# Patient Record
Sex: Female | Born: 1945
Health system: Southern US, Community
[De-identification: ages and names within clinical notes are randomized; demographics above are authoritative.]

## PROBLEM LIST (undated history)

## (undated) DIAGNOSIS — M779 Enthesopathy, unspecified: Secondary | ICD-10-CM

## (undated) DIAGNOSIS — K559 Vascular disorder of intestine, unspecified: Secondary | ICD-10-CM

## (undated) DIAGNOSIS — F419 Anxiety disorder, unspecified: Secondary | ICD-10-CM

## (undated) DIAGNOSIS — I771 Stricture of artery: Secondary | ICD-10-CM

## (undated) DIAGNOSIS — I739 Peripheral vascular disease, unspecified: Secondary | ICD-10-CM

## (undated) DIAGNOSIS — Z7982 Long term (current) use of aspirin: Secondary | ICD-10-CM

## (undated) DIAGNOSIS — M5481 Occipital neuralgia: Secondary | ICD-10-CM

## (undated) DIAGNOSIS — D509 Iron deficiency anemia, unspecified: Secondary | ICD-10-CM

## (undated) DIAGNOSIS — D649 Anemia, unspecified: Secondary | ICD-10-CM

## (undated) DIAGNOSIS — K5909 Other constipation: Secondary | ICD-10-CM

## (undated) DIAGNOSIS — M5432 Sciatica, left side: Secondary | ICD-10-CM

## (undated) DIAGNOSIS — H02003 Unspecified entropion of right eye, unspecified eyelid: Secondary | ICD-10-CM

## (undated) DIAGNOSIS — K219 Gastro-esophageal reflux disease without esophagitis: Secondary | ICD-10-CM

## (undated) DIAGNOSIS — I10 Essential (primary) hypertension: Secondary | ICD-10-CM

## (undated) DIAGNOSIS — I2510 Atherosclerotic heart disease of native coronary artery without angina pectoris: Secondary | ICD-10-CM

## (undated) DIAGNOSIS — K2960 Other gastritis without bleeding: Secondary | ICD-10-CM

## (undated) DIAGNOSIS — D696 Thrombocytopenia, unspecified: Secondary | ICD-10-CM

## (undated) DIAGNOSIS — I5189 Other ill-defined heart diseases: Secondary | ICD-10-CM

## (undated) DIAGNOSIS — T7840XA Allergy, unspecified, initial encounter: Secondary | ICD-10-CM

## (undated) DIAGNOSIS — I6789 Other cerebrovascular disease: Secondary | ICD-10-CM

## (undated) DIAGNOSIS — K760 Fatty (change of) liver, not elsewhere classified: Secondary | ICD-10-CM

## (undated) DIAGNOSIS — K449 Diaphragmatic hernia without obstruction or gangrene: Secondary | ICD-10-CM

## (undated) DIAGNOSIS — K529 Noninfective gastroenteritis and colitis, unspecified: Secondary | ICD-10-CM

## (undated) DIAGNOSIS — M1990 Unspecified osteoarthritis, unspecified site: Secondary | ICD-10-CM

## (undated) DIAGNOSIS — S92909A Unspecified fracture of unspecified foot, initial encounter for closed fracture: Secondary | ICD-10-CM

## (undated) DIAGNOSIS — B009 Herpesviral infection, unspecified: Secondary | ICD-10-CM

## (undated) DIAGNOSIS — IMO0002 Reserved for concepts with insufficient information to code with codable children: Secondary | ICD-10-CM

## (undated) DIAGNOSIS — I6522 Occlusion and stenosis of left carotid artery: Secondary | ICD-10-CM

## (undated) DIAGNOSIS — E559 Vitamin D deficiency, unspecified: Secondary | ICD-10-CM

## (undated) DIAGNOSIS — M51369 Other intervertebral disc degeneration, lumbar region without mention of lumbar back pain or lower extremity pain: Secondary | ICD-10-CM

## (undated) DIAGNOSIS — N390 Urinary tract infection, site not specified: Secondary | ICD-10-CM

## (undated) DIAGNOSIS — K648 Other hemorrhoids: Secondary | ICD-10-CM

## (undated) DIAGNOSIS — M654 Radial styloid tenosynovitis [de Quervain]: Secondary | ICD-10-CM

## (undated) DIAGNOSIS — Z9841 Cataract extraction status, right eye: Secondary | ICD-10-CM

## (undated) DIAGNOSIS — I700 Atherosclerosis of aorta: Secondary | ICD-10-CM

## (undated) DIAGNOSIS — R7303 Prediabetes: Secondary | ICD-10-CM

## (undated) DIAGNOSIS — M1611 Unilateral primary osteoarthritis, right hip: Secondary | ICD-10-CM

## (undated) DIAGNOSIS — I471 Supraventricular tachycardia, unspecified: Secondary | ICD-10-CM

## (undated) DIAGNOSIS — N3946 Mixed incontinence: Secondary | ICD-10-CM

## (undated) DIAGNOSIS — C4491 Basal cell carcinoma of skin, unspecified: Secondary | ICD-10-CM

## (undated) DIAGNOSIS — E785 Hyperlipidemia, unspecified: Secondary | ICD-10-CM

## (undated) DIAGNOSIS — K5790 Diverticulosis of intestine, part unspecified, without perforation or abscess without bleeding: Secondary | ICD-10-CM

## (undated) DIAGNOSIS — K589 Irritable bowel syndrome without diarrhea: Secondary | ICD-10-CM

## (undated) HISTORY — DX: Vascular disorder of intestine, unspecified: K55.9

## (undated) HISTORY — DX: Gastro-esophageal reflux disease without esophagitis: K21.9

## (undated) HISTORY — DX: Diverticulosis of intestine, part unspecified, without perforation or abscess without bleeding: K57.90

## (undated) HISTORY — DX: Herpesviral infection, unspecified: B00.9

## (undated) HISTORY — DX: Reserved for concepts with insufficient information to code with codable children: IMO0002

## (undated) HISTORY — DX: Noninfective gastroenteritis and colitis, unspecified: K52.9

## (undated) HISTORY — DX: Unspecified entropion of right eye, unspecified eyelid: H02.003

## (undated) HISTORY — DX: Anemia, unspecified: D64.9

## (undated) HISTORY — DX: Essential (primary) hypertension: I10

## (undated) HISTORY — DX: Diaphragmatic hernia without obstruction or gangrene: K44.9

## (undated) HISTORY — DX: Anxiety disorder, unspecified: F41.9

## (undated) HISTORY — DX: Sciatica, left side: M54.32

## (undated) HISTORY — DX: Other hemorrhoids: K64.8

## (undated) HISTORY — DX: Allergy, unspecified, initial encounter: T78.40XA

## (undated) HISTORY — DX: Urinary tract infection, site not specified: N39.0

## (undated) HISTORY — DX: Enthesopathy, unspecified: M77.9

## (undated) HISTORY — DX: Hyperlipidemia, unspecified: E78.5

## (undated) HISTORY — DX: Unspecified fracture of unspecified foot, initial encounter for closed fracture: S92.909A

## (undated) HISTORY — DX: Basal cell carcinoma of skin, unspecified: C44.91

## (undated) HISTORY — DX: Other gastritis without bleeding: K29.60

## (undated) HISTORY — PX: LUMBAR LAMINECTOMY/DECOMPRESSION MICRODISCECTOMY: SHX5026

---

## 1968-10-19 HISTORY — PX: APPENDECTOMY: SHX54

## 1968-10-19 HISTORY — PX: CHOLECYSTECTOMY: SHX55

## 1982-10-19 HISTORY — PX: TONSILLECTOMY: SUR1361

## 1983-10-20 HISTORY — PX: ABDOMINAL HYSTERECTOMY: SHX81

## 1983-10-20 HISTORY — PX: VAGINAL HYSTERECTOMY: SHX2639

## 1997-10-19 HISTORY — PX: LAPAROSCOPIC LYSIS INTESTINAL ADHESIONS: SUR778

## 1998-06-28 ENCOUNTER — Encounter: Payer: Self-pay | Admitting: Surgery

## 1998-06-28 ENCOUNTER — Ambulatory Visit (HOSPITAL_COMMUNITY): Admission: RE | Admit: 1998-06-28 | Discharge: 1998-06-28 | Payer: Self-pay | Admitting: Surgery

## 1998-07-17 ENCOUNTER — Observation Stay (HOSPITAL_COMMUNITY): Admission: RE | Admit: 1998-07-17 | Discharge: 1998-07-18 | Payer: Self-pay | Admitting: Surgery

## 1999-03-30 ENCOUNTER — Encounter: Payer: Self-pay | Admitting: Emergency Medicine

## 1999-03-30 ENCOUNTER — Encounter: Payer: Self-pay | Admitting: Family Medicine

## 1999-03-30 ENCOUNTER — Inpatient Hospital Stay (HOSPITAL_COMMUNITY): Admission: EM | Admit: 1999-03-30 | Discharge: 1999-04-02 | Payer: Self-pay | Admitting: Emergency Medicine

## 1999-04-23 ENCOUNTER — Ambulatory Visit (HOSPITAL_COMMUNITY): Admission: RE | Admit: 1999-04-23 | Discharge: 1999-04-23 | Payer: Self-pay | Admitting: Internal Medicine

## 2003-02-28 ENCOUNTER — Other Ambulatory Visit: Admission: RE | Admit: 2003-02-28 | Discharge: 2003-02-28 | Payer: Self-pay | Admitting: Internal Medicine

## 2003-07-30 LAB — HM SIGMOIDOSCOPY

## 2004-09-16 ENCOUNTER — Ambulatory Visit: Payer: Self-pay | Admitting: Internal Medicine

## 2004-10-23 ENCOUNTER — Ambulatory Visit: Payer: Self-pay | Admitting: Internal Medicine

## 2004-12-15 ENCOUNTER — Encounter: Admission: RE | Admit: 2004-12-15 | Discharge: 2004-12-15 | Payer: Self-pay | Admitting: Internal Medicine

## 2004-12-15 ENCOUNTER — Ambulatory Visit: Payer: Self-pay | Admitting: Internal Medicine

## 2005-03-26 ENCOUNTER — Ambulatory Visit: Payer: Self-pay | Admitting: Internal Medicine

## 2005-04-20 ENCOUNTER — Ambulatory Visit: Payer: Self-pay | Admitting: Internal Medicine

## 2005-05-16 ENCOUNTER — Encounter: Admission: RE | Admit: 2005-05-16 | Discharge: 2005-05-16 | Payer: Self-pay | Admitting: Otolaryngology

## 2005-05-24 ENCOUNTER — Emergency Department (HOSPITAL_COMMUNITY): Admission: EM | Admit: 2005-05-24 | Discharge: 2005-05-25 | Payer: Self-pay | Admitting: Emergency Medicine

## 2005-06-18 ENCOUNTER — Encounter: Admission: RE | Admit: 2005-06-18 | Discharge: 2005-06-18 | Payer: Self-pay | Admitting: Otolaryngology

## 2005-07-31 ENCOUNTER — Ambulatory Visit: Payer: Self-pay | Admitting: Internal Medicine

## 2005-08-28 ENCOUNTER — Ambulatory Visit: Payer: Self-pay | Admitting: Internal Medicine

## 2005-09-28 ENCOUNTER — Ambulatory Visit: Payer: Self-pay | Admitting: Internal Medicine

## 2005-09-28 ENCOUNTER — Encounter: Admission: RE | Admit: 2005-09-28 | Discharge: 2005-09-28 | Payer: Self-pay | Admitting: Internal Medicine

## 2005-10-24 ENCOUNTER — Ambulatory Visit: Payer: Self-pay | Admitting: Family Medicine

## 2005-11-12 ENCOUNTER — Ambulatory Visit: Payer: Self-pay | Admitting: Internal Medicine

## 2006-01-04 ENCOUNTER — Other Ambulatory Visit: Admission: RE | Admit: 2006-01-04 | Discharge: 2006-01-04 | Payer: Self-pay | Admitting: Obstetrics and Gynecology

## 2006-10-22 ENCOUNTER — Ambulatory Visit: Payer: Self-pay | Admitting: Internal Medicine

## 2007-01-14 ENCOUNTER — Ambulatory Visit: Payer: Self-pay | Admitting: Internal Medicine

## 2007-01-19 ENCOUNTER — Encounter: Admission: RE | Admit: 2007-01-19 | Discharge: 2007-01-19 | Payer: Self-pay | Admitting: Internal Medicine

## 2007-05-18 ENCOUNTER — Encounter: Payer: Self-pay | Admitting: Internal Medicine

## 2007-05-19 DIAGNOSIS — K219 Gastro-esophageal reflux disease without esophagitis: Secondary | ICD-10-CM | POA: Insufficient documentation

## 2007-07-22 ENCOUNTER — Encounter: Payer: Self-pay | Admitting: Internal Medicine

## 2007-10-20 HISTORY — PX: CATARACT EXTRACTION W/ INTRAOCULAR LENS IMPLANT: SHX1309

## 2007-10-20 HISTORY — PX: CATARACT EXTRACTION: SUR2

## 2007-11-04 ENCOUNTER — Ambulatory Visit: Payer: Self-pay | Admitting: Internal Medicine

## 2007-11-04 ENCOUNTER — Telehealth: Payer: Self-pay | Admitting: Internal Medicine

## 2007-11-04 DIAGNOSIS — M5416 Radiculopathy, lumbar region: Secondary | ICD-10-CM | POA: Insufficient documentation

## 2007-11-04 DIAGNOSIS — M545 Low back pain, unspecified: Secondary | ICD-10-CM | POA: Insufficient documentation

## 2007-11-04 DIAGNOSIS — I10 Essential (primary) hypertension: Secondary | ICD-10-CM | POA: Insufficient documentation

## 2007-11-18 ENCOUNTER — Encounter: Payer: Self-pay | Admitting: Internal Medicine

## 2007-11-19 ENCOUNTER — Ambulatory Visit (HOSPITAL_COMMUNITY): Admission: RE | Admit: 2007-11-19 | Discharge: 2007-11-19 | Payer: Self-pay | Admitting: Neurosurgery

## 2007-11-23 ENCOUNTER — Encounter: Payer: Self-pay | Admitting: Internal Medicine

## 2007-11-29 ENCOUNTER — Ambulatory Visit (HOSPITAL_COMMUNITY): Admission: RE | Admit: 2007-11-29 | Discharge: 2007-11-30 | Payer: Self-pay | Admitting: Neurosurgery

## 2007-12-15 ENCOUNTER — Encounter: Payer: Self-pay | Admitting: Internal Medicine

## 2007-12-26 ENCOUNTER — Encounter: Payer: Self-pay | Admitting: Internal Medicine

## 2007-12-29 ENCOUNTER — Ambulatory Visit (HOSPITAL_COMMUNITY): Admission: RE | Admit: 2007-12-29 | Discharge: 2007-12-30 | Payer: Self-pay | Admitting: Neurosurgery

## 2008-01-20 ENCOUNTER — Encounter: Payer: Self-pay | Admitting: Internal Medicine

## 2008-01-26 ENCOUNTER — Ambulatory Visit: Payer: Self-pay | Admitting: Internal Medicine

## 2008-02-02 ENCOUNTER — Encounter: Payer: Self-pay | Admitting: Internal Medicine

## 2008-02-13 ENCOUNTER — Encounter: Payer: Self-pay | Admitting: Internal Medicine

## 2008-02-23 ENCOUNTER — Ambulatory Visit: Payer: Self-pay | Admitting: Internal Medicine

## 2008-03-09 ENCOUNTER — Encounter: Payer: Self-pay | Admitting: Internal Medicine

## 2008-03-14 ENCOUNTER — Ambulatory Visit: Payer: Self-pay | Admitting: Internal Medicine

## 2008-03-15 ENCOUNTER — Inpatient Hospital Stay (HOSPITAL_COMMUNITY): Admission: RE | Admit: 2008-03-15 | Discharge: 2008-03-19 | Payer: Self-pay | Admitting: Neurosurgery

## 2008-04-16 ENCOUNTER — Encounter: Payer: Self-pay | Admitting: Internal Medicine

## 2008-05-14 ENCOUNTER — Ambulatory Visit: Payer: Self-pay | Admitting: Internal Medicine

## 2008-05-14 DIAGNOSIS — N3000 Acute cystitis without hematuria: Secondary | ICD-10-CM | POA: Insufficient documentation

## 2008-05-23 ENCOUNTER — Encounter: Payer: Self-pay | Admitting: Internal Medicine

## 2008-06-22 ENCOUNTER — Encounter: Payer: Self-pay | Admitting: Internal Medicine

## 2008-07-09 ENCOUNTER — Ambulatory Visit: Payer: Self-pay | Admitting: Internal Medicine

## 2008-07-09 DIAGNOSIS — J309 Allergic rhinitis, unspecified: Secondary | ICD-10-CM | POA: Insufficient documentation

## 2008-07-12 ENCOUNTER — Encounter: Payer: Self-pay | Admitting: Internal Medicine

## 2008-07-25 ENCOUNTER — Telehealth: Payer: Self-pay | Admitting: Internal Medicine

## 2008-09-17 ENCOUNTER — Ambulatory Visit: Payer: Self-pay | Admitting: Internal Medicine

## 2008-11-15 ENCOUNTER — Ambulatory Visit: Payer: Self-pay | Admitting: Internal Medicine

## 2008-11-15 LAB — CONVERTED CEMR LAB
ALT: 13 units/L (ref 0–35)
AST: 17 units/L (ref 0–37)
Basophils Absolute: 0 10*3/uL (ref 0.0–0.1)
Basophils Relative: 0.5 % (ref 0.0–3.0)
Bilirubin Urine: NEGATIVE
Blood in Urine, dipstick: NEGATIVE
CO2: 31 meq/L (ref 19–32)
Chloride: 106 meq/L (ref 96–112)
Cholesterol: 227 mg/dL (ref 0–200)
Creatinine, Ser: 0.9 mg/dL (ref 0.4–1.2)
Direct LDL: 150.6 mg/dL
Eosinophils Absolute: 0.2 10*3/uL (ref 0.0–0.7)
GFR calc Af Amer: 81 mL/min
GFR calc non Af Amer: 67 mL/min
Glucose, Bld: 106 mg/dL — ABNORMAL HIGH (ref 70–99)
Glucose, Urine, Semiquant: NEGATIVE
HCT: 34.9 % — ABNORMAL LOW (ref 36.0–46.0)
HDL: 48.7 mg/dL (ref >39.0)
MCV: 82.1 fL (ref 78.0–100.0)
Neutrophils Relative %: 53.8 % (ref 43.0–77.0)
Platelets: 196 10*3/uL (ref 150–400)
RBC: 4.25 M/uL (ref 3.87–5.11)
TSH: 1.5 microintl units/mL (ref 0.35–5.50)
VLDL: 30 mg/dL (ref 0–40)
WBC: 7 10*3/uL (ref 4.5–10.5)

## 2008-12-03 ENCOUNTER — Ambulatory Visit: Payer: Self-pay | Admitting: Internal Medicine

## 2008-12-03 DIAGNOSIS — E785 Hyperlipidemia, unspecified: Secondary | ICD-10-CM | POA: Insufficient documentation

## 2008-12-03 DIAGNOSIS — D509 Iron deficiency anemia, unspecified: Secondary | ICD-10-CM | POA: Insufficient documentation

## 2008-12-07 ENCOUNTER — Telehealth: Payer: Self-pay | Admitting: Internal Medicine

## 2009-02-06 ENCOUNTER — Ambulatory Visit: Payer: Self-pay | Admitting: Internal Medicine

## 2009-02-06 LAB — CONVERTED CEMR LAB
Basophils Relative: 0.6 % (ref 0.0–3.0)
Cholesterol: 233 mg/dL — ABNORMAL HIGH (ref 0–200)
Direct LDL: 146.9 mg/dL
Eosinophils Relative: 5.2 % — ABNORMAL HIGH (ref 0.0–5.0)
HCT: 34.1 % — ABNORMAL LOW (ref 36.0–46.0)
HDL: 47.6 mg/dL (ref >39.00)
Hemoglobin: 11.8 g/dL — ABNORMAL LOW (ref 12.0–15.0)
MCHC: 34.6 g/dL (ref 30.0–36.0)
MCV: 83.3 fL (ref 78.0–100.0)
Monocytes Relative: 8.5 % (ref 3.0–12.0)
Triglycerides: 164 mg/dL — ABNORMAL HIGH (ref 0.0–149.0)

## 2009-02-13 ENCOUNTER — Ambulatory Visit: Payer: Self-pay | Admitting: Internal Medicine

## 2009-02-13 LAB — CONVERTED CEMR LAB
Cholesterol, target level: 200 mg/dL
LDL Goal: 130 mg/dL

## 2009-03-29 ENCOUNTER — Encounter: Payer: Self-pay | Admitting: Internal Medicine

## 2009-03-30 ENCOUNTER — Encounter: Payer: Self-pay | Admitting: Internal Medicine

## 2009-04-08 ENCOUNTER — Ambulatory Visit: Payer: Self-pay | Admitting: Internal Medicine

## 2009-04-08 LAB — CONVERTED CEMR LAB
Bilirubin, Direct: 0 mg/dL (ref 0.0–0.3)
HDL: 52.3 mg/dL (ref >39.00)
LDL Cholesterol: 80 mg/dL (ref 0–99)
Total Bilirubin: 0.9 mg/dL (ref 0.3–1.2)
Total Protein: 7 g/dL (ref 6.0–8.3)
Triglycerides: 116 mg/dL (ref 0.0–149.0)

## 2009-04-12 ENCOUNTER — Ambulatory Visit: Payer: Self-pay | Admitting: Internal Medicine

## 2009-12-06 ENCOUNTER — Telehealth: Payer: Self-pay | Admitting: Internal Medicine

## 2009-12-22 ENCOUNTER — Emergency Department (HOSPITAL_COMMUNITY): Admission: EM | Admit: 2009-12-22 | Discharge: 2009-12-22 | Payer: Self-pay | Admitting: Emergency Medicine

## 2009-12-26 ENCOUNTER — Telehealth: Payer: Self-pay | Admitting: Internal Medicine

## 2009-12-27 ENCOUNTER — Ambulatory Visit: Payer: Self-pay | Admitting: Internal Medicine

## 2009-12-27 DIAGNOSIS — M940 Chondrocostal junction syndrome [Tietze]: Secondary | ICD-10-CM | POA: Insufficient documentation

## 2009-12-27 DIAGNOSIS — F4321 Adjustment disorder with depressed mood: Secondary | ICD-10-CM | POA: Insufficient documentation

## 2009-12-31 ENCOUNTER — Inpatient Hospital Stay (HOSPITAL_COMMUNITY): Admission: RE | Admit: 2009-12-31 | Discharge: 2010-01-01 | Payer: Self-pay | Admitting: Obstetrics and Gynecology

## 2009-12-31 HISTORY — PX: ANTERIOR AND POSTERIOR VAGINAL REPAIR: SUR5

## 2010-01-28 ENCOUNTER — Ambulatory Visit: Payer: Self-pay | Admitting: Internal Medicine

## 2010-01-28 LAB — CONVERTED CEMR LAB
ALT: 13 units/L (ref 0–35)
AST: 16 units/L (ref 0–37)
Albumin: 3.6 g/dL (ref 3.5–5.2)
Alkaline Phosphatase: 90 units/L (ref 39–117)
BUN: 22 mg/dL (ref 6–23)
Basophils Absolute: 0 10*3/uL (ref 0.0–0.1)
Basophils Relative: 0.3 % (ref 0.0–3.0)
Bilirubin, Direct: 0 mg/dL (ref 0.0–0.3)
CO2: 30 meq/L (ref 19–32)
Calcium: 9 mg/dL (ref 8.4–10.5)
Creatinine, Ser: 0.8 mg/dL (ref 0.4–1.2)
Eosinophils Absolute: 0.3 10*3/uL (ref 0.0–0.7)
Glucose, Bld: 88 mg/dL (ref 70–99)
Glucose, Urine, Semiquant: NEGATIVE
Ketones, urine, test strip: NEGATIVE
MCV: 83.9 fL (ref 78.0–100.0)
Monocytes Absolute: 0.5 10*3/uL (ref 0.1–1.0)
Monocytes Relative: 7.5 % (ref 3.0–12.0)
Nitrite: NEGATIVE
RBC: 4.09 M/uL (ref 3.87–5.11)
Total Bilirubin: 0.3 mg/dL (ref 0.3–1.2)
Total CHOL/HDL Ratio: 3
Triglycerides: 192 mg/dL — ABNORMAL HIGH (ref 0.0–149.0)
VLDL: 38.4 mg/dL (ref 0.0–40.0)
WBC: 7 10*3/uL (ref 4.5–10.5)

## 2010-02-03 ENCOUNTER — Ambulatory Visit: Payer: Self-pay | Admitting: Internal Medicine

## 2010-05-19 ENCOUNTER — Ambulatory Visit: Payer: Self-pay | Admitting: Family Medicine

## 2010-05-19 DIAGNOSIS — J209 Acute bronchitis, unspecified: Secondary | ICD-10-CM | POA: Insufficient documentation

## 2010-05-30 ENCOUNTER — Telehealth: Payer: Self-pay | Admitting: Internal Medicine

## 2010-05-30 ENCOUNTER — Ambulatory Visit: Payer: Self-pay | Admitting: Internal Medicine

## 2010-06-11 ENCOUNTER — Telehealth: Payer: Self-pay | Admitting: Internal Medicine

## 2010-08-04 ENCOUNTER — Ambulatory Visit: Payer: Self-pay | Admitting: Internal Medicine

## 2010-11-12 ENCOUNTER — Other Ambulatory Visit: Payer: Self-pay | Admitting: Dermatology

## 2010-11-16 LAB — CONVERTED CEMR LAB
Folate: >20 ng/mL
Iron: 24 ug/dL — ABNORMAL LOW (ref 42–145)

## 2010-11-20 NOTE — Progress Notes (Signed)
Summary: new rx needed  Phone Note Refill Request Call back at Home Phone [phone] Message from:  Patient----live call  Refills Requested: Medication #1:  DIOVAN HCT 80-12.5 MG TABS 1 once daily   Dosage confirmed as above?Dosage Confirmed [facility] to [facility] in [place]...[phone]  Initial call taken by: Warnell Forester,  June 11, 2010 9:14 AM    Prescriptions: DIOVAN HCT 80-12.5 MG TABS (VALSARTAN-HYDROCHLOROTHIAZIDE) 1 once daily  #90 x 3   Entered by:   Willy Eddy, LPN   Authorized by:   Stacie Glaze MD   Signed by:   Willy Eddy, LPN on 13/24/4010   Method used:   Electronically to        [facility] [address] (retail)       [address], [address]       [place], [place]  [address]       Ph: [phone]       Fax: [phone]   RxID:   [id number]

## 2010-11-20 NOTE — Progress Notes (Signed)
Summary: ? chest/breast pain?  Phone Note Call from Patient   Caller: Patient Call For: Stacie Glaze MD Reason for Call: Lab or Test Results Summary of Call: Pt went to the ER on Sunday for left breast pain and had cardiac eval that was normal. The pain is worse at rest, and she cannot reproduce it. Taking Tylenol and Xanax with relief.  When she hurts, she gets anxious and that makes it worse.  Wants to see Dr. Lovell Sheehan. [phone] Initial call taken by: Lynann Beaver CMA,  December 26, 2009 9:12 AM  Follow-up for Phone Call        ov given for tom orrow Follow-up by: Willy Eddy, LPN,  December 26, 2009 10:15 AM

## 2010-11-20 NOTE — Assessment & Plan Note (Signed)
Summary: cpx/mm/pt rsc from bmp/cjr PT RSC/NJR/pt rsc from bmp/cjr   Vital Signs:  Patient profile:   65 year old female Height:      65 inches Weight:      150 pounds BMI:     25.05 Temp:     98.2 degrees F oral Pulse rate:   72 / minute Resp:     14 per minute BP sitting:   130 / 70  (left arm)  Vitals Entered By: Willy Eddy, LPN (February 03, 2010 3:37 PM) CC: cpx-no bone denity   CC:  cpx-no bone denity.  History of Present Illness: The pt was asked about all immunizations, health maint. services that are appropriate to their age and was given guidance on diet exercize  and weight management   Preventive Screening-Counseling & Management  Alcohol-Tobacco     Smoking Status: never  Problems Prior to Update: 1)  Adjustment Disorder With Depressed Mood  (ICD-309.0) 2)  Costochondritis  (ICD-733.6) 3)  Preventive Health Care  (ICD-V70.0) 4)  Hyperlipidemia  (ICD-272.4) 5)  Other Specified Iron Deficiency Anemias  (ICD-280.8) 6)  Allergic Rhinitis  (ICD-477.9) 7)  Acute Cystitis  (ICD-595.0) 8)  Back Pain, Lumbar, With Radiculopathy  (ICD-724.4) 9)  Hypertension  (ICD-401.9) 10)  Gerd  (ICD-530.81)  Medications Prior to Update: 1)  Nexium 40 Mg  Cpdr (Esomeprazole Magnesium) .... Take 1 Tablet By Mouth Once A Day 2)  Oscal 500/200 D-3 500-200 Mg-Unit  Tabs (Calcium-Vitamin D) .... Take 1 Tablet By Mouth Once A Day 3)  Xanax 0.25 Mg  Tabs (Alprazolam) .... As Needed 4)  Allegra 180 Mg  Tabs (Fexofenadine Hcl) .Marland Kitchen.. 1 Once Daily 5)  Diovan Hct 80-12.5 Mg Tabs (Valsartan-Hydrochlorothiazide) .Marland Kitchen.. 1 Once Daily 6)  Nu-Iron 150 Mg Caps (Polysaccharide Iron Complex) .... One By Mouth Daily 7)  Fish Oil Concentrate 1000 Mg Caps (Omega-3 Fatty Acids) .... Two By Mouth Bid 8)  Lipitor 20 Mg Tab (Atorvastatin Calcium) .... Take 1  On Mondays and Fridays 9)  Lexapro 10 Mg Tabs (Escitalopram Oxalate) .... One By Mouth Daily  Current Medications (verified): 1)  Nexium 40 Mg   Cpdr (Esomeprazole Magnesium) .... Take 1 Tablet By Mouth Once A Day 2)  Oscal 500/200 D-3 500-200 Mg-Unit  Tabs (Calcium-Vitamin D) .... Take 1 Tablet By Mouth Once A Day 3)  Xanax 0.25 Mg  Tabs (Alprazolam) .... As Needed 4)  Allegra 180 Mg  Tabs (Fexofenadine Hcl) .Marland Kitchen.. 1 Once Daily 5)  Diovan Hct 80-12.5 Mg Tabs (Valsartan-Hydrochlorothiazide) .Marland Kitchen.. 1 Once Daily 6)  Lipitor 20 Mg Tab (Atorvastatin Calcium) .... Take 1  On Mondays and Fridays  Allergies (verified): 1)  ! Penicillin 2)  ! Codeine 3)  ! Sulfa 4)  ! Biaxin 5)  ! * Mycins 6)  ! Macrobid (Nitrofurantoin Monohyd Macro) 7)  ! Tegretol  Past History:  Family History: Last updated: 11/04/2007 mother... alive    Family History of Arthritis father died at 46 of a CVA Family History Hypertension  Social History: Last updated: 11/04/2007 Never Smoked Married Alcohol use-no Drug use-no  Risk Factors: Smoking Status: never (02/03/2010)  Past medical, surgical, family and social histories (including risk factors) reviewed, and no changes noted (except as noted below).  Past Medical History: Reviewed history from 12/03/2008 and no changes required. GERD IBS Hypertension Allergic rhinitis Hyperlipidemia  Past Surgical History: Reviewed history from 12/03/2008 and no changes required. back injections Laparotomy-exploratory adhesions Cholecystectomy Appendectomy Tonsillectomy Hysterectomy Lumbar laminectomy and disectomy  Family History: Reviewed history from 11/04/2007 and no changes required. mother... alive    Family History of Arthritis father died at 27 of a CVA Family History Hypertension  Social History: Reviewed history from 11/04/2007 and no changes required. Never Smoked Married Alcohol use-no Drug use-no  Review of Systems  The patient denies anorexia, fever, weight loss, weight gain, vision loss, decreased hearing, hoarseness, chest pain, syncope, dyspnea on exertion, peripheral edema,  prolonged cough, headaches, hemoptysis, abdominal pain, melena, hematochezia, severe indigestion/heartburn, hematuria, incontinence, genital sores, muscle weakness, suspicious skin lesions, transient blindness, difficulty walking, depression, unusual weight change, abnormal bleeding, enlarged lymph nodes, angioedema, and breast masses.    Physical Exam  General:  Well-developed,well-nourished,in no acute distress; alert,appropriate and cooperative throughout examination Head:  Normocephalic and atraumatic without obvious abnormalities. No apparent alopecia or balding. Ears:  R ear normal and L ear normal.   Nose:  External nasal examination shows no deformity or inflammation. Nasal mucosa are pink and moist without lesions or exudates. Mouth:  Oral mucosa and oropharynx without lesions or exudates.  Teeth in good repair. Neck:  No deformities, masses, or tenderness noted. Lungs:  normal respiratory effort and no wheezes.   Heart:  normal rate and regular rhythm.   Abdomen:  soft and non-tender.   Pulses:  R and L carotid,radial,femoral,dorsalis pedis and posterior tibial pulses are full and equal bilaterally Extremities:  No clubbing, cyanosis, edema, or deformity noted with normal full range of motion of all joints.   Neurologic:  No cranial nerve deficits noted. Station and gait are normal. Plantar reflexes are down-going bilaterally. DTRs are symmetrical throughout. Sensory, motor and coordinative functions appear intact. Skin:  color normal and no rashes.   Cervical Nodes:  No lymphadenopathy noted Axillary Nodes:  No palpable lymphadenopathy Psych:  Oriented X3 and memory intact for recent and remote.     Impression & Recommendations:  Problem # 1:  PREVENTIVE HEALTH CARE (ICD-V70.0) The pt was asked about all immunizations, health maint. services that are appropriate to their age and was given guidance on diet exercize  and weight management  Mammogram: normal (04/17/2009) Pap  smear: normal (09/17/2009) Td Booster: Historical (10/19/2002)   Flu Vax: Fluvax 3+ (07/09/2008)   Pneumovax: Pneumovax (12/03/2008) Chol: 181 (01/28/2010)   HDL: 54.70 (01/28/2010)   LDL: 88 (01/28/2010)   TG: 192.0 (01/28/2010) TSH: 1.29 (01/28/2010)   Next mammogram due:: 04/2010 (02/03/2010)  Discussed using sunscreen, use of alcohol, drug use, self breast exam, routine dental care, routine eye care, schedule for GYN exam, routine physical exam, seat belts, multiple vitamins, osteoporosis prevention, adequate calcium intake in diet, recommendations for immunizations, mammograms and Pap smears.  Discussed exercise and checking cholesterol.  Discussed gun safety, safe sex, and contraception.  Problem # 2:  OTHER SPECIFIED IRON DEFICIENCY ANEMIAS (ICD-280.8) add Mutivitamin The following medications were removed from the medication list:    Nu-iron 150 Mg Caps (Polysaccharide iron complex) ..... One by mouth daily  Hgb: 11.7 (01/28/2010)   Hct: 34.3 (01/28/2010)   Platelets: 183.0 (01/28/2010) RBC: 4.09 (01/28/2010)   RDW: 14.2 (01/28/2010)   WBC: 7.0 (01/28/2010) MCV: 83.9 (01/28/2010)   MCHC: 34.2 (01/28/2010) Iron: 24 (12/03/2008)   % Sat: 6.3 (12/03/2008) B12: 484 (12/03/2008)   Folate: > 20.0 ng/mL (12/03/2008)   TSH: 1.29 (01/28/2010)  Complete Medication List: 1)  Nexium 40 Mg Cpdr (Esomeprazole magnesium) .... Take 1 tablet by mouth once a day 2)  Oscal 500/200 D-3 500-200 Mg-unit Tabs (Calcium-vitamin d) .... Take 1  tablet by mouth once a day 3)  Xanax 0.25 Mg Tabs (Alprazolam) .... As needed 4)  Allegra 180 Mg Tabs (Fexofenadine hcl) .Marland Kitchen.. 1 once daily 5)  Diovan Hct 80-12.5 Mg Tabs (Valsartan-hydrochlorothiazide) .Marland Kitchen.. 1 once daily 6)  Lipitor 20 Mg Tab (Atorvastatin calcium) .... Take 1  on mondays and fridays  Patient Instructions: 1)  add a multivitamin to replace the e and the c  make sure it is a woman's vitamin with iron and folate 2)  Please schedule a follow-up  appointment in 6 months.   Preventive Care Screening  Mammogram:    Date:  04/17/2009    Next Due:  04/2010    Results:  normal   Pap Smear:    Date:  09/17/2009    Next Due:  09/2010    Results:  normal      Contraindications/Deferment of Procedures/Staging:    Test/Procedure: FLU VAX    Reason for deferment: patient declined

## 2010-11-20 NOTE — Progress Notes (Signed)
Summary: Pt still has non-productive cough.Req ov with Dr. Lovell Sheehan  Phone Note Call from Patient Call back at [facility] Phone [phone]   Caller: Patient Complaint: Urinary/GYN Problems Summary of Call: Pt called and said that she came in to see Dr. Caryl Never re: a cough she has and was told to sch an appt with Dr. Lovell Sheehan if cough does not improve. Pt says that she may need to get a chest xray.  Pt has non productive cough.  Initial call taken by: Lucy Antigua,  May 30, 2010 11:33 AM     Appended Document: Orders Update    Clinical Lists Changes  Medications: Added new medication of ZITHROMAX Z-PAK 250 MG TABS (AZITHROMYCIN) take as directed - Signed Rx of ZITHROMAX Z-PAK 250 MG TABS (AZITHROMYCIN) take as directed;  #1 x 0;  Signed;  Entered by: Willy Eddy, LPN;  Authorized by: Stacie Glaze MD;  Method used: Electronically to [facility] [address], [place], [place], [place]  [address], Ph: [phone], Fax: [phone]    Prescriptions: ZITHROMAX Z-PAK 250 MG TABS (AZITHROMYCIN) take as directed  #1 x 0   Entered by:   Willy Eddy, LPN   Authorized by:   Stacie Glaze MD   Signed by:   Willy Eddy, LPN on 44/12/4740   Method used:   Electronically to        [facility] (retail)       [address], [address]       [place], [place]  [address]       Ph: [phone]       Fax: [phone]   RxID:   [id number]

## 2010-11-20 NOTE — Assessment & Plan Note (Signed)
Summary: 6 month fup//ccm   Vital Signs:  Patient profile:   65 year old female Height:      65 inches Weight:      152 pounds BMI:     25.39 Temp:     98.2 degrees F oral Pulse rate:   72 / minute Resp:     14 per minute BP sitting:   130 / 82  (left arm)  Vitals Entered By: Willy Eddy, LPN (August 04, 2010 10:42 AM) CC: roa, Hypertension Management Is Patient Diabetic? No   Primary Care Provider:  Stacie Glaze MD  CC:  roa and Hypertension Management.  History of Present Illness: The events of the chronic bronchitis in August reviwed with the pt. The blood pressre and the GERD is stable has been on nexium but this has been cost equivalent the Diovan is "too expensive" as weel and pushes here into the donut hold too fast plantar faciatis pain in the AM    Hypertension History:      She denies headache, chest pain, palpitations, dyspnea with exertion, orthopnea, PND, peripheral edema, visual symptoms, neurologic problems, syncope, and side effects from treatment.        Positive major cardiovascular risk factors include female age 18 years old or older, hyperlipidemia, and hypertension.  Negative major cardiovascular risk factors include non-tobacco-user status.     Preventive Screening-Counseling & Management  Alcohol-Tobacco     Smoking Status: never  Current Problems (verified): 1)  Acute Bronchitis  (ICD-466.0) 2)  Adjustment Disorder With Depressed Mood  (ICD-309.0) 3)  Costochondritis  (ICD-733.6) 4)  Preventive Health Care  (ICD-V70.0) 5)  Hyperlipidemia  (ICD-272.4) 6)  Other Specified Iron Deficiency Anemias  (ICD-280.8) 7)  Allergic Rhinitis  (ICD-477.9) 8)  Acute Cystitis  (ICD-595.0) 9)  Back Pain, Lumbar, With Radiculopathy  (ICD-724.4) 10)  Hypertension  (ICD-401.9) 11)  Gerd  (ICD-530.81)  Current Medications (verified): 1)  Prilosec 20 Mg Cpdr (Omeprazole) .... One By Mouth Daily 2)  Multivitamins  Caps (Multiple Vitamin) .Marland Kitchen.. 1 Once  Daily 3)  Xanax 0.25 Mg  Tabs (Alprazolam) .... As Needed Three Times A Day 4)  Allegra 180 Mg  Tabs (Fexofenadine Hcl) .Marland Kitchen.. 1 Once Daily 5)  Benazepril-Hydrochlorothiazide 20-12.5 Mg Tabs (Benazepril-Hydrochlorothiazide) .... One By Mouth Daily 6)  Lipitor 20 Mg Tab (Atorvastatin Calcium) .... Take 1 On  Monday and Friday  Allergies (verified): 1)  ! Penicillin 2)  ! Codeine 3)  ! Sulfa 4)  ! Biaxin 5)  ! * Mycins 6)  ! Macrobid (Nitrofurantoin Monohyd Macro) 7)  ! Tegretol  Past History:  Family History: Last updated: 11/04/2007 mother... alive    Family History of Arthritis father died at 62 of a CVA Family History Hypertension  Social History: Last updated: 11/04/2007 Never Smoked Married Alcohol use-no Drug use-no  Risk Factors: Smoking Status: never (08/04/2010)  Past medical, surgical, family and social histories (including risk factors) reviewed, and no changes noted (except as noted below).  Past Medical History: Reviewed history from 12/03/2008 and no changes required. GERD IBS Hypertension Allergic rhinitis Hyperlipidemia  Past Surgical History: Reviewed history from 12/03/2008 and no changes required. back injections Laparotomy-exploratory adhesions Cholecystectomy Appendectomy Tonsillectomy Hysterectomy Lumbar laminectomy and disectomy  Family History: Reviewed history from 11/04/2007 and no changes required. mother... alive    Family History of Arthritis father died at 15 of a CVA Family History Hypertension  Social History: Reviewed history from 11/04/2007 and no changes required. Never Smoked Married Alcohol  use-no Drug use-no  Review of Systems       Flu Vaccine Consent Questions     Do you have a history of severe allergic reactions to this vaccine? no    Any prior history of allergic reactions to egg and/or gelatin? no    Do you have a sensitivity to the preservative Thimersol? no    Do you have a past history of  Guillan-Barre Syndrome? no    Do you currently have an acute febrile illness? no    Have you ever had a severe reaction to latex? no    Vaccine information given and explained to patient? yes    Are you currently pregnant? no    Lot Number:[id number]   Exp Date:04/18/2011   Site Given  Left Deltoid IM   Physical Exam  General:  Well-developed,well-nourished,in no acute distress; alert,appropriate and cooperative throughout examination Head:  Normocephalic and atraumatic without obvious abnormalities. No apparent alopecia or balding. Ears:  R ear normal and L ear normal.   Nose:  External nasal examination shows no deformity or inflammation. Nasal mucosa are pink and moist without lesions or exudates. Neck:  No deformities, masses, or tenderness noted. Lungs:  Normal respiratory effort, chest expands symmetrically. Lungs are clear to auscultation, no crackles or wheezes. Heart:  Normal rate and regular rhythm. S1 and S2 normal without gallop, murmur, click, rub or other extra sounds. Abdomen:  soft and non-tender.     Impression & Recommendations:  Problem # 1:  HYPERTENSION (ICD-401.9) could not affor the benicar or the diovan requesting generic Her updated medication list for this problem includes:    Benazepril-hydrochlorothiazide 20-12.5 Mg Tabs (Benazepril-hydrochlorothiazide) ..... One by mouth daily  BP today: 130/82 Prior BP: 140/70 (05/19/2010)  Prior 10 Yr Risk Heart Disease: 7 % (04/12/2009)  Labs Reviewed: K+: 3.6 (01/28/2010) Creat: : 0.8 (01/28/2010)   Chol: 181 (01/28/2010)   HDL: 54.70 (01/28/2010)   LDL: 88 (01/28/2010)   TG: 192.0 (01/28/2010)  Problem # 2:  GERD (ICD-530.81)  Her updated medication list for this problem includes:    Prilosec 20 Mg Cpdr (Omeprazole) ..... One by mouth daily  Labs Reviewed: Hgb: 11.7 (01/28/2010)   Hct: 34.3 (01/28/2010)  Complete Medication List: 1)  Prilosec 20 Mg Cpdr (Omeprazole) .... One by mouth daily 2)   Multivitamins Caps (Multiple vitamin) .Marland Kitchen.. 1 once daily 3)  Xanax 0.25 Mg Tabs (Alprazolam) .... As needed three times a day 4)  Allegra 180 Mg Tabs (Fexofenadine hcl) .Marland Kitchen.. 1 once daily 5)  Benazepril-hydrochlorothiazide 20-12.5 Mg Tabs (Benazepril-hydrochlorothiazide) .... One by mouth daily 6)  Lipitor 20 Mg Tab (Atorvastatin calcium) .... Take 1 on  monday and friday  Other Orders: Flu Vaccine 66yrs + MEDICARE PATIENTS (Z6109) Administration Flu vaccine - MCR (U0454)  Hypertension Assessment/Plan:      The patient's hypertensive risk group is category B: At least one risk factor (excluding diabetes) with no target organ damage.  Her calculated 10 year risk of coronary heart disease is 7 %.  Today's blood pressure is 130/82.  Her blood pressure goal is < 140/90.  Patient Instructions: 1)  Welcome to medicare exam in [facility]  fasting 30 min Prescriptions: XANAX 0.25 MG  TABS (ALPRAZOLAM) as needed three times a day  #90 x 2   Entered and Authorized by:   Stacie Glaze MD   Signed by:   Stacie Glaze MD on 08/04/2010   Method used:   Print then Give to Patient  RxID:   [id number] BENAZEPRIL-HYDROCHLOROTHIAZIDE 20-12.5 MG TABS (BENAZEPRIL-HYDROCHLOROTHIAZIDE) one by mouth daily  #30 x 11   Entered and Authorized by:   Stacie Glaze MD   Signed by:   Stacie Glaze MD on 08/04/2010   Method used:   Electronically to        [facility] [address] (retail)       [address], [address]       [place], [place]  [address]       Ph: [phone]       Fax: [phone]   RxID:   [id number] BENAZEPRIL-HYDROCHLOROTHIAZIDE 20-12.5 MG TABS (BENAZEPRIL-HYDROCHLOROTHIAZIDE) one by mouth daily  #30 x 11   Entered and Authorized by:   Stacie Glaze MD   Signed by:   Stacie Glaze MD on 08/04/2010   Method used:   Print then Give to Patient   RxID:   [id number] BENICAR HCT 40-12.5 MG TABS (OLMESARTAN MEDOXOMIL-HCTZ) 1 by mouth daily  #30 x 6    Entered and Authorized by:   Stacie Glaze MD   Signed by:   Stacie Glaze MD on 08/04/2010   Method used:   Print then Give to Patient   RxID:   [id number]    Orders Added: 1)  Flu Vaccine 12yrs + MEDICARE PATIENTS [Q2039] 2)  Administration Flu vaccine - MCR [G0008] 3)  Est. Patient Level IV [63016]  Appended Document: Orders Update    Clinical Lists Changes  Observations: Added new observation of ZOSTAVAX VIS: 07/31/05 given August 04, 2010. (08/04/2010 11:38) Added new observation of ZOSTAVAX LOT: [id number] (08/04/2010 11:38) Added new observation of ZOSTAVAX EXP: 05/31/2011 (08/04/2010 11:38) Added new observation of ZOSTAVAX BY: Willy Eddy, LPN (35/57/3220 11:38) Added new observation of ZOSTAVAX RTE:  (08/04/2010 11:38) Added new observation of ZOSTAVAXDOSE: 0.5 ml (08/04/2010 11:38) Added new observation of ZOSTAVAX MFR: Merck (08/04/2010 11:38) Added new observation of ZOSTAVAXSITE: right deltoid (08/04/2010 11:38) Added new observation of ZOSTAVAX: Zostavax (08/04/2010 11:38)       Immunizations Administered:  Zostavax # 1:    Vaccine Type: Zostavax    Site: right deltoid    Mfr: Merck    Dose: 0.5 ml    Route:     Given by: Willy Eddy, LPN    Exp. Date: 05/31/2011    Lot #: [id number]    VIS given: 07/31/05 given August 04, 2010.

## 2010-11-20 NOTE — Assessment & Plan Note (Signed)
Summary: breast pain/bmw   Vital Signs:  Patient profile:   65 year old female Height:      65 inches Weight:      151 pounds BMI:     25.22 Temp:     98.2 degrees F oral Pulse rate:   76 / minute Resp:     14 per minute BP sitting:   130 / 80  (left arm)  Vitals Entered By: Willy Eddy, LPN (December 27, 2009 1:52 PM) CC: c/o chest pain -t0 er- not cardiac after cardiac work up- has had a few more episodes since then with soreness in area after the pain-   CC:  c/o chest pain -t0 er- not cardiac after cardiac work up- has had a few more episodes since then with soreness in area after the pain-.  History of Present Illness: pt seen in ER with chest pain and EKG and pos enzymes were negative for coronary dz. Pain however persists Located in the upper left chest and mid sternum the pain is not worsened by touch, inspiration or cough no rash has appeared no sensitivity to clothes  pain 8-9 one scale of 1to 10 no sob, no diaphoresis increased anxety  Preventive Screening-Counseling & Management  Alcohol-Tobacco     Smoking Status: never  Problems Prior to Update: 1)  Preventive Health Care  (ICD-V70.0) 2)  Hyperlipidemia  (ICD-272.4) 3)  Other Specified Iron Deficiency Anemias  (ICD-280.8) 4)  Allergic Rhinitis  (ICD-477.9) 5)  Acute Cystitis  (ICD-595.0) 6)  Back Pain, Lumbar, With Radiculopathy  (ICD-724.4) 7)  Hypertension  (ICD-401.9) 8)  Gerd  (ICD-530.81)  Medications Prior to Update: 1)  Nexium 40 Mg  Cpdr (Esomeprazole Magnesium) .... Take 1 Tablet By Mouth Once A Day 2)  Oscal 500/200 D-3 500-200 Mg-Unit  Tabs (Calcium-Vitamin D) .... Take 1 Tablet By Mouth Once A Day 3)  Xanax 0.25 Mg  Tabs (Alprazolam) .... As Needed 4)  Allegra 180 Mg  Tabs (Fexofenadine Hcl) .Marland Kitchen.. 1 Once Daily 5)  Diovan Hct 80-12.5 Mg Tabs (Valsartan-Hydrochlorothiazide) .Marland Kitchen.. 1 Once Daily 6)  Nu-Iron 150 Mg Caps (Polysaccharide Iron Complex) .... One By Mouth Daily 7)  Fish Oil  Concentrate 1000 Mg Caps (Omega-3 Fatty Acids) .... Two By Mouth Bid 8)  Cipro 250 Mg Tabs (Ciprofloxacin Hcl) .... One Two Times A Day X 10 Days 9)  Lipitor 20 Mg Tab (Atorvastatin Calcium) .... Take 1  On Mondays and Fridays 10)  Lipitor 20 Mg Tab (Atorvastatin Calcium) .... Take 1 Tablet  By Mouth Monday and Friday  Current Medications (verified): 1)  Nexium 40 Mg  Cpdr (Esomeprazole Magnesium) .... Take 1 Tablet By Mouth Once A Day 2)  Oscal 500/200 D-3 500-200 Mg-Unit  Tabs (Calcium-Vitamin D) .... Take 1 Tablet By Mouth Once A Day 3)  Xanax 0.25 Mg  Tabs (Alprazolam) .... As Needed 4)  Allegra 180 Mg  Tabs (Fexofenadine Hcl) .Marland Kitchen.. 1 Once Daily 5)  Diovan Hct 80-12.5 Mg Tabs (Valsartan-Hydrochlorothiazide) .Marland Kitchen.. 1 Once Daily 6)  Nu-Iron 150 Mg Caps (Polysaccharide Iron Complex) .... One By Mouth Daily 7)  Fish Oil Concentrate 1000 Mg Caps (Omega-3 Fatty Acids) .... Two By Mouth Bid 8)  Lipitor 20 Mg Tab (Atorvastatin Calcium) .... Take 1  On Mondays and Fridays 9)  Lexapro 10 Mg Tabs (Escitalopram Oxalate) .... One By Mouth Daily  Allergies (verified): 1)  ! Penicillin 2)  ! Codeine 3)  ! Sulfa 4)  ! Biaxin 5)  ! *  Mycins 6)  ! Macrobid (Nitrofurantoin Monohyd Macro) 7)  ! Tegretol  Past History:  Family History: Last updated: 11/04/2007 mother... alive    Family History of Arthritis father died at 10 of a CVA Family History Hypertension  Social History: Last updated: 11/04/2007 Never Smoked Married Alcohol use-no Drug use-no  Risk Factors: Smoking Status: never (12/27/2009)  Past medical, surgical, family and social histories (including risk factors) reviewed, and no changes noted (except as noted below).  Past Medical History: Reviewed history from 12/03/2008 and no changes required. GERD IBS Hypertension Allergic rhinitis Hyperlipidemia  Past Surgical History: Reviewed history from 12/03/2008 and no changes required. back  injections Laparotomy-exploratory adhesions Cholecystectomy Appendectomy Tonsillectomy Hysterectomy Lumbar laminectomy and disectomy  Family History: Reviewed history from 11/04/2007 and no changes required. mother... alive    Family History of Arthritis father died at 28 of a CVA Family History Hypertension  Social History: Reviewed history from 11/04/2007 and no changes required. Never Smoked Married Alcohol use-no Drug use-no  Review of Systems  The patient denies anorexia, fever, weight loss, weight gain, vision loss, decreased hearing, hoarseness, chest pain, syncope, dyspnea on exertion, peripheral edema, prolonged cough, headaches, hemoptysis, abdominal pain, melena, hematochezia, severe indigestion/heartburn, hematuria, incontinence, genital sores, muscle weakness, suspicious skin lesions, transient blindness, difficulty walking, depression, unusual weight change, abnormal bleeding, enlarged lymph nodes, angioedema, and breast masses.    Physical Exam  General:  Well-developed,well-nourished,in no acute distress; alert,appropriate and cooperative throughout examination Head:  Normocephalic and atraumatic without obvious abnormalities. No apparent alopecia or balding. Ears:  R ear normal and L ear normal.   Nose:  External nasal examination shows no deformity or inflammation. Nasal mucosa are pink and moist without lesions or exudates. Mouth:  Oral mucosa and oropharynx without lesions or exudates.  Teeth in good repair. Neck:  No deformities, masses, or tenderness noted. Chest Wall:  tender at 6 th rib and costrochrodal junction Breasts:  No mass, nodules, thickening, tenderness, bulging, retraction, inflamation, nipple discharge or skin changes noted.   Lungs:  normal respiratory effort and no wheezes.   Heart:  normal rate and regular rhythm.     Impression & Recommendations:  Problem # 1:  COSTOCHONDRITIS (ICD-733.6)  injected point injection along 6 ths  rib Discussed medication use, applications of heat or ice, and exercises.   Orders: Trigger Point Injection (1 or 2 muscles) (16109) Depo-Medrol 20mg  (J1020)  Problem # 2:  ADJUSTMENT DISORDER WITH DEPRESSED MOOD (ICD-309.0) tearfull increased chest pain and anxiety new rx for lexapro  Problem # 3:  HYPERTENSION (ICD-401.9) Assessment: Improved stable Her updated medication list for this problem includes:    Diovan Hct 80-12.5 Mg Tabs (Valsartan-hydrochlorothiazide) .Marland Kitchen... 1 once daily  BP today: 130/80 Prior BP: 132/76 (04/12/2009)  Prior 10 Yr Risk Heart Disease: 7 % (04/12/2009)  Labs Reviewed: K+: 4.4 (11/15/2008) Creat: : 0.9 (11/15/2008)   Chol: 155 (04/08/2009)   HDL: 52.30 (04/08/2009)   LDL: 80 (04/08/2009)   TG: 116.0 (04/08/2009)  Complete Medication List: 1)  Nexium 40 Mg Cpdr (Esomeprazole magnesium) .... Take 1 tablet by mouth once a day 2)  Oscal 500/200 D-3 500-200 Mg-unit Tabs (Calcium-vitamin d) .... Take 1 tablet by mouth once a day 3)  Xanax 0.25 Mg Tabs (Alprazolam) .... As needed 4)  Allegra 180 Mg Tabs (Fexofenadine hcl) .Marland Kitchen.. 1 once daily 5)  Diovan Hct 80-12.5 Mg Tabs (Valsartan-hydrochlorothiazide) .Marland Kitchen.. 1 once daily 6)  Nu-iron 150 Mg Caps (Polysaccharide iron complex) .... One by mouth daily 7)  Fish Oil Concentrate 1000 Mg Caps (Omega-3 fatty acids) .... Two by mouth bid 8)  Lipitor 20 Mg Tab (Atorvastatin calcium) .... Take 1  on mondays and fridays 9)  Lexapro 10 Mg Tabs (Escitalopram oxalate) .... One by mouth daily  Patient Instructions: 1)  Please schedule a follow-up appointment in 4 weeks. Prescriptions: NEXIUM 40 MG  CPDR (ESOMEPRAZOLE MAGNESIUM) Take 1 tablet by mouth once a day  #90 x 3   Entered and Authorized by:   Stacie Glaze MD   Signed by:   Stacie Glaze MD on 12/27/2009   Method used:   Faxed to ...       [facility] (mail-order)             , [place]         Ph:        Fax: [phone]   RxID:   [id number]

## 2010-11-20 NOTE — Assessment & Plan Note (Signed)
Summary: cough/chest congestion/cjr   Vital Signs:  Patient profile:   65 year old female O2 Sat:      97 % on Room air Temp:     98 degrees F oral BP sitting:   140 / 70  (left arm) Cuff size:   regular  Vitals Entered By: Sid Falcon LPN (May 19, 2010 11:41 AM)  O2 Flow:  Room air CC: cough, chest congestion X 3 weeks   History of Present Illness: Same-day visit.  3 week history of dry cough. Onset initially of sore throat for couple of days and subsequent development of cough. No history of asthma. Nonsmoker. Denies any fever. Had a couple days of some mild chills when this first started. No nasal congestion. Has taken cough drops without much improvement. Overall feels well. Cough predominantly daytime.  Allergies: 1)  ! Penicillin 2)  ! Codeine 3)  ! Sulfa 4)  ! Biaxin 5)  ! * Mycins 6)  ! Macrobid (Nitrofurantoin Monohyd Macro) 7)  ! Tegretol  Past History:  Past Medical History: Last updated: 12/03/2008 GERD IBS Hypertension Allergic rhinitis Hyperlipidemia  Past Surgical History: Last updated: 12/03/2008 back injections Laparotomy-exploratory adhesions Cholecystectomy Appendectomy Tonsillectomy Hysterectomy Lumbar laminectomy and disectomy  Family History: Last updated: 11/04/2007 mother... alive    Family History of Arthritis father died at 93 of a CVA Family History Hypertension  Social History: Last updated: 11/04/2007 Never Smoked Married Alcohol use-no Drug use-no  Risk Factors: Smoking Status: never (02/03/2010) PMH-FH-SH reviewed for relevance  Review of Systems  The patient denies anorexia, fever, weight loss, chest pain, syncope, dyspnea on exertion, peripheral edema, prolonged cough, and hemoptysis.    Physical Exam  General:  Well-developed,well-nourished,in no acute distress; alert,appropriate and cooperative throughout examination Ears:  External ear exam shows no significant lesions or deformities.  Otoscopic  examination reveals clear canals, tympanic membranes are intact bilaterally without bulging, retraction, inflammation or discharge. Hearing is grossly normal bilaterally. Nose:  External nasal examination shows no deformity or inflammation. Nasal mucosa are pink and moist without lesions or exudates. Mouth:  Oral mucosa and oropharynx without lesions or exudates.  Teeth in good repair. Neck:  No deformities, masses, or tenderness noted. Lungs:  Normal respiratory effort, chest expands symmetrically. Lungs are clear to auscultation, no crackles or wheezes. Heart:  Normal rate and regular rhythm. S1 and S2 normal without gallop, murmur, click, rub or other extra sounds.   Impression & Recommendations:  Problem # 1:  ACUTE BRONCHITIS (ICD-466.0) suspect viral.  Try Tessalon for cough suppression.  consider CXR if no better in 2 weeks. Her updated medication list for this problem includes:    Benzonatate 200 Mg Caps (Benzonatate) ..... One by mouth q 8 hours as needed cough  Complete Medication List: 1)  Nexium 40 Mg Cpdr (Esomeprazole magnesium) .... Take 1 tablet by mouth once a day 2)  Oscal 500/200 D-3 500-200 Mg-unit Tabs (Calcium-vitamin d) .... Take 1 tablet by mouth once a day 3)  Xanax 0.25 Mg Tabs (Alprazolam) .... As needed 4)  Allegra 180 Mg Tabs (Fexofenadine hcl) .Marland Kitchen.. 1 once daily 5)  Diovan Hct 80-12.5 Mg Tabs (Valsartan-hydrochlorothiazide) .Marland Kitchen.. 1 once daily 6)  Lipitor 20 Mg Tab (Atorvastatin calcium) .... Take 1 on  monday and friday 7)  Benzonatate 200 Mg Caps (Benzonatate) .... One by mouth q 8 hours as needed cough  Patient Instructions: 1)  Acute Bronchitis symptoms for less then 10 days are not  helped by antibiotics. Take over  the counter cough medications. Call if no improvement in 5-7 days, sooner if increasing cough, fever, or new symptoms ( shortness of breath, chest pain) .  Prescriptions: BENZONATATE 200 MG CAPS (BENZONATATE) one by mouth q 8 hours as needed cough   #30 x 0   Entered and Authorized by:   Evelena Peat MD   Signed by:   Evelena Peat MD on 05/19/2010   Method used:   Electronically to        [facility] [address] (retail)       [address], [address]       [place], [place]  [address]       Ph: [phone]       Fax: [phone]   RxID:   [id number]

## 2010-11-20 NOTE — Progress Notes (Signed)
Summary: SAMPLES PLEASE  Phone Note Call from Patient Call back at Home Phone [phone] Call back at Work Phone [phone]   Caller: Patient Call For: Stacie Glaze MD Summary of Call: PT [facility] MORE SAMPLES OF [facility] 20-12.5MG  OR CALL [facility] [address] [phone]. Initial call taken by: Heron Sabins,  December 06, 2009 11:52 AM    Prescriptions: BENICAR HCT 20-12.5 MG TABS (OLMESARTAN MEDOXOMIL-HCTZ) one by mouth daily  #30 x 6   Entered by:   Willy Eddy, LPN   Authorized by:   Stacie Glaze MD   Signed by:   Willy Eddy, LPN on 29/52/8413   Method used:   Electronically to        [facility] [address] (retail)       [address]       [place], [place]  [address]       Ph: [phone]       Fax: [phone]   RxID:   [id number]

## 2011-01-11 LAB — BASIC METABOLIC PANEL
Calcium: 9 mg/dL (ref 8.4–10.5)
Glucose, Bld: 122 mg/dL — ABNORMAL HIGH (ref 70–99)
Sodium: 141 mEq/L (ref 135–145)

## 2011-01-11 LAB — DIFFERENTIAL
Basophils Absolute: 0 10*3/uL (ref 0.0–0.1)
Eosinophils Absolute: 0.2 10*3/uL (ref 0.0–0.7)
Eosinophils Relative: 3 % (ref 0–5)
Monocytes Absolute: 0.9 10*3/uL (ref 0.1–1.0)
Monocytes Relative: 11 % (ref 3–12)
Neutro Abs: 5 10*3/uL (ref 1.7–7.7)

## 2011-01-11 LAB — CBC
HCT: 33.8 % — ABNORMAL LOW (ref 36.0–46.0)
Hemoglobin: 11.4 g/dL — ABNORMAL LOW (ref 12.0–15.0)
MCHC: 33.8 g/dL (ref 30.0–36.0)
MCV: 84.3 fL (ref 78.0–100.0)
RBC: 4.01 MIL/uL (ref 3.87–5.11)
WBC: 8.4 10*3/uL (ref 4.0–10.5)

## 2011-01-11 LAB — POCT CARDIAC MARKERS
Myoglobin, poc: 64.4 ng/mL (ref 12–200)
Troponin i, poc: <0.05 ng/mL (ref 0.00–0.09)

## 2011-01-12 LAB — BASIC METABOLIC PANEL
CO2: 30 mEq/L (ref 19–32)
Calcium: 8.2 mg/dL — ABNORMAL LOW (ref 8.4–10.5)
GFR calc Af Amer: >60 mL/min (ref >60)
GFR calc non Af Amer: >60 mL/min (ref >60)
Potassium: 3.6 mEq/L (ref 3.5–5.1)
Sodium: 135 mEq/L (ref 135–145)

## 2011-01-12 LAB — CBC
Hemoglobin: 13.1 g/dL (ref 12.0–15.0)
Hemoglobin: 9.8 g/dL — ABNORMAL LOW (ref 12.0–15.0)
RBC: 3.42 MIL/uL — ABNORMAL LOW (ref 3.87–5.11)
RBC: 4.72 MIL/uL (ref 3.87–5.11)
WBC: 9.4 10*3/uL (ref 4.0–10.5)

## 2011-01-12 LAB — COMPREHENSIVE METABOLIC PANEL
ALT: 15 U/L (ref 0–35)
AST: 17 U/L (ref 0–37)
Alkaline Phosphatase: 108 U/L (ref 39–117)
CO2: 26 mEq/L (ref 19–32)
Calcium: 9.5 mg/dL (ref 8.4–10.5)
Chloride: 98 mEq/L (ref 96–112)
GFR calc Af Amer: >60 mL/min (ref >60)
GFR calc non Af Amer: >60 mL/min (ref >60)
Potassium: 3.2 mEq/L — ABNORMAL LOW (ref 3.5–5.1)
Sodium: 135 mEq/L (ref 135–145)
Total Bilirubin: 0.5 mg/dL (ref 0.3–1.2)

## 2011-01-12 LAB — URINALYSIS, ROUTINE W REFLEX MICROSCOPIC
Glucose, UA: NEGATIVE mg/dL
Ketones, ur: NEGATIVE mg/dL
Leukocytes, UA: NEGATIVE
pH: 5 (ref 5.0–8.0)

## 2011-02-04 ENCOUNTER — Other Ambulatory Visit: Payer: Self-pay | Admitting: Internal Medicine

## 2011-02-04 DIAGNOSIS — F419 Anxiety disorder, unspecified: Secondary | ICD-10-CM

## 2011-02-25 ENCOUNTER — Encounter: Payer: Self-pay | Admitting: Internal Medicine

## 2011-03-02 ENCOUNTER — Encounter: Payer: Self-pay | Admitting: Internal Medicine

## 2011-03-02 ENCOUNTER — Ambulatory Visit (INDEPENDENT_AMBULATORY_CARE_PROVIDER_SITE_OTHER): Payer: Medicare Other | Admitting: Internal Medicine

## 2011-03-02 DIAGNOSIS — D508 Other iron deficiency anemias: Secondary | ICD-10-CM

## 2011-03-02 DIAGNOSIS — I10 Essential (primary) hypertension: Secondary | ICD-10-CM

## 2011-03-02 DIAGNOSIS — M25531 Pain in right wrist: Secondary | ICD-10-CM

## 2011-03-02 DIAGNOSIS — J309 Allergic rhinitis, unspecified: Secondary | ICD-10-CM

## 2011-03-02 DIAGNOSIS — Z0000 Encounter for general adult medical examination without abnormal findings: Secondary | ICD-10-CM

## 2011-03-02 DIAGNOSIS — E785 Hyperlipidemia, unspecified: Secondary | ICD-10-CM

## 2011-03-02 LAB — HEPATIC FUNCTION PANEL
ALT: 19 U/L (ref 0–35)
AST: 21 U/L (ref 0–37)
Alkaline Phosphatase: 111 U/L (ref 39–117)
Bilirubin, Direct: 0.1 mg/dL (ref 0.0–0.3)
Total Protein: 6.7 g/dL (ref 6.0–8.3)

## 2011-03-02 LAB — CBC WITH DIFFERENTIAL/PLATELET
Basophils Absolute: 0 10*3/uL (ref 0.0–0.1)
Basophils Relative: 0.4 % (ref 0.0–3.0)
Hemoglobin: 12.4 g/dL (ref 12.0–15.0)
Lymphocytes Relative: 30.7 % (ref 12.0–46.0)
Monocytes Relative: 7.9 % (ref 3.0–12.0)
Neutro Abs: 4.7 10*3/uL (ref 1.4–7.7)
RBC: 4.32 Mil/uL (ref 3.87–5.11)
RDW: 13.7 % (ref 11.5–14.6)

## 2011-03-02 LAB — POCT URINALYSIS DIPSTICK
Bilirubin, UA: NEGATIVE
Blood, UA: NEGATIVE
Glucose, UA: NEGATIVE
Nitrite, UA: NEGATIVE

## 2011-03-02 LAB — TSH: TSH: 1.52 u[IU]/mL (ref 0.35–5.50)

## 2011-03-02 LAB — BASIC METABOLIC PANEL
CO2: 26 mEq/L (ref 19–32)
Chloride: 103 mEq/L (ref 96–112)
Sodium: 138 mEq/L (ref 135–145)

## 2011-03-02 LAB — LIPID PANEL: Total CHOL/HDL Ratio: 3

## 2011-03-02 MED ORDER — ATORVASTATIN CALCIUM 20 MG PO TABS: 20.0000 mg | ORAL_TABLET | ORAL | Status: DC

## 2011-03-02 MED ORDER — ATORVASTATIN CALCIUM 20 MG PO TABS: 20.0000 mg | ORAL_TABLET | Freq: Every day | ORAL | Status: DC

## 2011-03-02 NOTE — Progress Notes (Signed)
Addended by: Melchor Amour on: 03/02/2011 06:04 PM   Modules accepted: Orders

## 2011-03-02 NOTE — Progress Notes (Signed)
Subjective:    Bianca Fry is a 65 y.o. female who presents for Medicare Annual/Subsequent preventive examination.  Preventive Screening-Counseling & Management  Tobacco History  Smoking status  . Never Smoker   Smokeless tobacco  . Not on file     Problems Prior to Visit 1.   Current Problems (verified) Patient Active Problem List  Diagnoses  . HYPERLIPIDEMIA  . OTHER SPECIFIED IRON DEFICIENCY ANEMIAS  . ADJUSTMENT DISORDER WITH DEPRESSED MOOD  . HYPERTENSION  . ACUTE BRONCHITIS  . ALLERGIC RHINITIS  . GERD  . ACUTE CYSTITIS  . BACK PAIN, LUMBAR, WITH RADICULOPATHY  . COSTOCHONDRITIS    Medications Prior to Visit Current Outpatient Prescriptions on File Prior to Visit  Medication Sig Dispense Refill  . ALPRAZolam (XANAX) 0.25 MG tablet TAKE ONE TABLET BY MOUTH THREE TIMES DAILY AS NEEDED  90 tablet  3  . atorvastatin (LIPITOR) 20 MG tablet Take 20 mg by mouth. 1 on Monday and friday       . benazepril-hydrochlorthiazide (LOTENSIN HCT) 20-12.5 MG per tablet Take 1 tablet by mouth daily.        . fexofenadine (ALLEGRA) 180 MG tablet Take 180 mg by mouth daily.        . multivitamin (THERAGRAN) per tablet Take 1 tablet by mouth daily.        Marland Kitchen omeprazole (PRILOSEC) 20 MG capsule Take 20 mg by mouth daily.          Current Medications (verified) Current Outpatient Prescriptions  Medication Sig Dispense Refill  . ALPRAZolam (XANAX) 0.25 MG tablet TAKE ONE TABLET BY MOUTH THREE TIMES DAILY AS NEEDED  90 tablet  3  . atorvastatin (LIPITOR) 20 MG tablet Take 20 mg by mouth. 1 on Monday and friday       . benazepril-hydrochlorthiazide (LOTENSIN HCT) 20-12.5 MG per tablet Take 1 tablet by mouth daily.        . Biotin 10 MG TABS Take by mouth daily.        . fexofenadine (ALLEGRA) 180 MG tablet Take 180 mg by mouth daily.        . multivitamin (THERAGRAN) per tablet Take 1 tablet by mouth daily.        Marland Kitchen omeprazole (PRILOSEC) 20 MG capsule Take 20 mg by mouth daily.            Allergies (verified) Carbamazepine; Clarithromycin; Codeine; Nitrofurantoin; Penicillins; and Sulfonamide derivatives   PAST HISTORY  Family History Family History  Problem Relation Age of Onset  . Adopted: Yes  . Arthritis    . Hypertension    . Stroke Father     Social History History  Substance Use Topics  . Smoking status: Never Smoker   . Smokeless tobacco: Not on file  . Alcohol Use: No     Are there smokers in your home (other than you)? No  Risk Factors Current exercise habits: Home exercise routine includes walking 1 hrs per day.  Dietary issues discussed: none   Cardiac risk factors: advanced age (older than 79 for men, 87 for women) and family history of premature cardiovascular disease.  Depression Screen (Note: if answer to either of the following is "Yes", a more complete depression screening is indicated)   Over the past two weeks, have you felt down, depressed or hopeless? No  Over the past two weeks, have you felt little interest or pleasure in doing things? No  Have you lost interest or pleasure in daily life? No  Do you  often feel hopeless? No  Do you cry easily over simple problems? No  Activities of Daily Living In your present state of health, do you have any difficulty performing the following activities?:  Driving? No Managing money?  No Feeding yourself? No Getting from bed to chair? No Climbing a flight of stairs? No Preparing food and eating?: No Bathing or showering? No Getting dressed: No Getting to the toilet? No Using the toilet:No Moving around from place to place: No In the past year have you fallen or had a near fall?:No   Are you sexually active?  Yes  Do you have more than one partner?  No  Hearing Difficulties: No Do you often ask people to speak up or repeat themselves? No Do you experience ringing or noises in your ears? No Do you have difficulty understanding soft or whispered voices? No   Do you feel that  you have a problem with memory? No  Do you often misplace items? No  Do you feel safe at home?  No  Cognitive Testing  Alert? Yes  Normal Appearance?Yes  Oriented to person? Yes  Place? Yes   Time? Yes  Recall of three objects?  Yes  Can perform simple calculations? Yes  Displays appropriate judgment?yes  Can read the correct time from a watch face?Yes   Advanced Directives have been discussed with the patient? Yes  List the Names of Other Physician/Practitioners you currently use: 1.  GYN  Duard Larsen 2.   Dermatologist  Any [place]  Indicate any recent Medical Services you may have received from other than [facility] providers in the past year (date may be approximate).  Immunization History  Administered Date(s) Administered  . Influenza Whole 07/09/2008, 08/04/2010  . Pneumococcal Polysaccharide 12/03/2008  . Td 10/19/2002  . Zoster 08/04/2010    Screening Tests Health Maintenance  Topic Date Due  . Colonoscopy  12/21/1994  . Pneumococcal Polysaccharide Vaccine Age 39 And Over  12/20/2009  . Mammogram  04/18/2011  . Influenza Vaccine  07/20/2011  . Tetanus/tdap  10/19/2012  . Zostavax  Completed    All answers were reviewed with the patient and necessary referrals were made:  Bianca Fry   03/02/2011   History reviewed: allergies, current medications, past family history, past medical history, past social history, past surgical history and problem list  Review of Systems A comprehensive review of systems was negative.    Objective:     Vision by Snellen chart: right eye:20/20, left eye:20/20  Body mass index is 25.92 kg/(m^2). BP 134/80  Pulse 76  Temp(Src) 98.2 F (36.8 C) (Oral)  Resp 14  Ht 5\' 4"  (1.626 m)  Wt 151 lb (68.493 kg)  BMI 25.92 kg/m2  BP 134/80  Pulse 76  Temp(Src) 98.2 F (36.8 C) (Oral)  Resp 14  Ht 5\' 4"  (1.626 m)  Wt 151 lb (68.493 kg)  BMI 25.92 kg/m2  General Appearance:    Alert, cooperative, no distress, appears stated age    Head:    Normocephalic, without obvious abnormality, atraumatic  Eyes:    PERRL, conjunctiva/corneas clear, EOM's intact, fundi    benign, both eyes  Ears:    Normal TM's and external ear canals, both ears  Nose:   Nares normal, septum midline, mucosa normal, no drainage    or sinus tenderness  Throat:   Lips, mucosa, and tongue normal; teeth and gums normal  Neck:   Supple, symmetrical, trachea midline, no adenopathy;    thyroid:  no enlargement/tenderness/nodules;  no carotid   bruit or JVD  Back:     Symmetric, no curvature, ROM normal, no CVA tenderness  Lungs:     Clear to auscultation bilaterally, respirations unlabored  Chest Wall:    No tenderness or deformity   Heart:    Regular rate and rhythm, S1 and S2 normal, no murmur, rub   or gallop  Breast Exam:    No tenderness, masses, or nipple abnormality  Abdomen:     Soft, non-tender, bowel sounds active all four quadrants,    no masses, no organomegaly  Genitalia:    Normal female without lesion, discharge or tenderness  Rectal:    Normal tone, normal prostate, no masses or tenderness;   guaiac negative stool  Extremities:   Extremities normal, atraumatic, no cyanosis or edema  Pulses:   2+ and symmetric all extremities  Skin:   Skin color, texture, turgor normal, no rashes or lesions  Lymph nodes:   Cervical, supraclavicular, and axillary nodes normal  Neurologic:   CNII-XII intact, normal strength, sensation and reflexes    throughout       Assessment:     This is a routine physical examination for this healthy  Female. Reviewed all health maintenance protocols including mammography colonoscopy bone density and reviewed appropriate screening labs. Her immunization history was reviewed as well as her current medications and allergies refills of her chronic medications were given and the plan for yearly health maintenance was discussed all orders and referrals were made as appropriate.      Plan:     During the course of  the visit the patient was educated and counseled about appropriate screening and preventive services including:    Bone densitometry screening  Advanced directives: power of attorney for healthcare on file  Diet review for nutrition referral? Yes ____  Not Indicated __x__   Patient Instructions (the written plan) was given to the patient.  Medicare Attestation I have personally reviewed: The patient's medical and social history Their use of alcohol, tobacco or illicit drugs Their current medications and supplements The patient's functional ability including ADLs,fall risks, home safety risks, cognitive, and hearing and visual impairment Diet and physical activities Evidence for depression or mood disorders  The patient's weight, height, BMI, and visual acuity have been recorded in the chart.  I have made referrals, counseling, and provided education to the patient based on review of the above and I have provided the patient with a written personalized care plan for preventive services.     Bianca Fry   03/02/2011       in addition to her Medicare wellness examination she is seen for followup of chronic medical problems including hyperlipidemia seasonal allergic rhinitis with flare history of mild anemia.  She states that she's been compliant with her hyperlipidemia medications and diet she will have a monitoring of the lipid and liver profile today.  She has an acute flare of her seasonal allergic rhinitis she took a Zyrtec today but that does not seem to help her symptoms.  Physical examination she does have swollen purple turbinates with postnasal drip.  We recommend the use of a nasal spray Nasonex 2 sprays each nostril daily and continue the Zyrtec as directed.  Other appropriate monitoring today will include a CBC for history of anemia.

## 2011-03-02 NOTE — Progress Notes (Signed)
Addended by: Stacie Glaze MD on: 03/02/2011 10:04 AM   Modules accepted: Orders

## 2011-03-03 NOTE — Op Note (Signed)
Bianca Fry, Bianca Fry                 ACCOUNT NO.:  [id number]   MEDICAL RECORD NO.:  [id number]          PATIENT TYPE:  OIB   LOCATION:  3009                         FACILITY:  [facility]   PHYSICIAN:  Clydene Fake, M.D.  DATE OF BIRTH:  [birth year]   DATE OF PROCEDURE:  12/29/2007  DATE OF DISCHARGE:  12/30/2007                               OPERATIVE REPORT   PREOPERATIVE DIAGNOSES:  1. Recurrent herniated nucleus pulposus,  2. Spondylosis, stenosis, left L4-5.  3. Left-sided radiculopathy.   PREOPERATIVE DIAGNOSES:  1. Recurrent herniated nucleus pulposus,  2. Spondylosis, stenosis, left L4-5.  3. Left-sided radiculopathy.   OPERATION/PROCEDURE:  1. Redo decompressive laminectomy decompressing the left L4 and L5      roots (two levels).  2. Microdissection with microscope   SURGEON:  Clydene Fake, M.D.   ASSISTANT:  Cristi Loron, M.D.   ANESTHESIA:  General endotracheal tube anesthesia.   BLOOD LOSS:  Minimal.   BLOOD REPLACED:  None.   COMPLICATIONS:  None.   PROCEDURE:  The patient is a 65 year old woman who underwent left L4-5  diskectomy a few weeks prior and had 3 or 4 days relief and started  having increased hip pain and this progressed over the last week to  progressive severe left back, hip and left leg pain, numbness in the L4-  5 distribution despite medications as oral steroids.  Repeat MRI was  done showing spinal change, scarring and some recurrent disk herniation.  The patient brought in for redo decompressive laminectomy.   PROCEDURE IN DETAIL:  The patient brought to the operating room. General  anesthesia was induced.  The patient was placed in the prone position on  the Wilson frame with all pressure points padded.  The patient was  prepped and draped in the sterile fashion.  Incision injected with 10 mL  of 1% lidocaine with epinephrine.  Incision was then made at the site of  previous incision.  Incision taken down to the fascia.   Hemostasis  obtained with Bovie cauterization.  Fascia opened up easily after  removing the sutures and self-retaining retractor was placed.  We could  see the laminotomy defect, scar covering the dura.  We placed a marker  there, took an x-ray, confirmed our positioning at L4-5.  Microscope was  brought in for microdissection at this point to do the decompressive  laminectomy and remove more of L4 and L5 lamina, and just a little bit  more of the medial facet to get  better exposure and decompress the  area.  We were able to peel off some of the scar that was over the dura  and that did decompress it. This was also causing some mass  and  compression in the pedicle  on up to the disk space was explored through  the epidural space and found the recurrent disk herniation especially up  into the  4 and 5 roots with pituitary rongeurs and curettes.  We were  able to mobilize the disk and remove the disk.  Diskectomy was done  again with curettes and pituitary  rongeurs.  When we were finished,  explored the central canal.  We had good decompression of central canal,  good decompression of both the 4 and 5 roots.  We irrigated with  antibiotic solution.  We had good hemostasis. Intermixed fentanyl  was  placed the epidural space.  Retractors removed.  Fascia closed with 0  Vicryl interrupted suture.  Subcutaneous tissue closed with 2-0 and 3-0  Vicryl interrupted sutures.  Skin closed benzoin and Steri-Strips.  Dressing was placed.  The patient was placed back in supine position,  awakened from anesthesia and transferred to the recovery room  in stable  condition.           ______________________________  Clydene Fake, M.D.     JRH/MEDQ  D:  12/29/2007  T:  12/31/2007  Job:  [id number]

## 2011-03-03 NOTE — Op Note (Signed)
Bianca Fry, Bianca Fry                 ACCOUNT NO.:  [id number]   MEDICAL RECORD NO.:  [id number]          PATIENT TYPE:  INP   LOCATION:  3020                         FACILITY:  [facility]   PHYSICIAN:  Clydene Fake, M.D.  DATE OF BIRTH:  December 26, 1945   DATE OF PROCEDURE:  03/15/2008  DATE OF DISCHARGE:                               OPERATIVE REPORT   PREOPERATIVE DIAGNOSIS:  Recurrent herniated nucleus pulposus,  spondylosis, degenerative disease stenosis L4-5.   POSTOPERATIVE DIAGNOSIS:  Recurrent herniated nucleus pulposus,  spondylosis, degenerative disease stenosis L4-5.   PROCEDURES:  Redo decompressive laminectomy in L4 and L5 (two levels),  posterior lumbar interbody fusion L4-5, Saber interbody cages at L4-5,  nonsegmented expedient pedicle screw fixation at L4-5, posterolateral  fusion L4-5, autografts and incision, infused BMP.   SURGEON:  Clydene Fake, M.D.   ASSISTANT:  Hewitt Shorts, M.D.   ANESTHESIA:  General endotracheal tube anesthesia.   ESTIMATED BLOOD LOSS:  100 mL.   BLOOD GIVEN:  None.   DRAINS:  None.   COMPLICATIONS:  None.   REASON FOR PROCEDURE:  The patient is a 65 year old woman who has had  back pain, left leg pain numbness, and weakness with some L4-5 root  involvement.  She has had two disk herniations just recently and after  diskectomy had improvement in her symptoms, and then recurred, this was  second recurrence of disk herniation.  MRI showed huge disk herniation,  left side L4-5 with significant collapse of the disk space and broad-  based disk bulge and stenosis and the patient brought in for  decompression and fusion.   PROCEDURE IN DETAIL:  The patient was brought to the operating room  where general anesthesia induced. The patient was placed in a prone  position on a Wilson frame with all pressure points padded. The patient  was prepped and draped in sterile fashion.  General anesthesia injected  with 10 mL of 1% lidocaine  with epinephrine.  Incision was then made at  the previous scar along the lower lumbar spine and this incision  extended cephalad.  Incision was taken down to fascia.  Hemostasis was  obtained with Bovie cauterization.  The fascia was incised.  X-ray, we  can see at the left side L4-5 of the operative site and we could  actually dissect bluntly and moved down to the previous left side  operative sites and exposed the laminotomy and the lamina of L4 and L5  with good subperiosteal dissection.  At L3 also, we could get out  laterally and then we exposed the L4 and L5 transverse processes.  We  continued this process on the right side of subperiosteal dissection L3-  4 and L4-5 spinous process lamina out to the facets so we could expose  the 4 and 5 transverse processes at the right side.  The self-retaining  retractors placed.  X-rays were obtained with markers at the pedicles of  L4 and L5.  An x-ray confirmed hardware positioning.  We could also see  the laminotomy defect.  The decompressive laminectomy was then done  removing spinous process lamina, decompressing the central canal, and  decompressing both 4 and 5 roots bilaterally, and before we did this, we  plucked out huge fragments of disks from the laminotomy defect from the  recurrent disk herniation.  Disk space was very cleft, we were able to  enter the disk space with interspace spreaders which were of 9 mm.  We  prepared the interspace for interbody fusion with the broaches from the  Saber system and then we packed the interspace with autograft bone.  We  packed two 9-mm Saber cages with infused BMP and autograft bone.  We  tapped the cage in the one side while holding distraction on the other,  removed the distraction device, packed the interspace with bone and  placed a second Saber cage in the inferolateral side.  Cages were firmly  in place and good position, roots and dura were well decompressed.  We  did do foraminotomies  over the L5 roots especially on the left  interspace scar and we have to dissect carefully through the scar.  Attention was taken to decorticate transverse processes and lateral  facets with high-speed drill.  __________ were then found.  Using a  fluoroscopy and intraoperative landmarks, we placed a probe, probing  down the pedicle, tapped it using a small bone probe making sure it is a  bony circumference, then placed a pedicle screw, 50-mm screws were  placed at L4, 45-mm screws placed at L5.  AP and lateral fluoroscopy  imaging showed good positioning of the screws.  We placed BMP autograft  bone and the Vitoss sponges and the posterolateral gutters for  posterolateral fusion of L4 through L5.  We then placed rods into the  screw heads, we placed the locking nuts and tightened them down with the  final tightener.  Then, we explored the nerve roots.  We had good  hemostasis.  We had good decompression of the L4 and L5 roots and the  central canal bilaterally.  Retractors were removed, fascia closed with  4-0 Vicryl interrupted suture.  Subcutaneous tissue closed with 2-0 and  3-0 Vicryl interrupted sutures.  Skin closed with benzoin and Steri-  Strips.  Dressing was placed.  The patient was placed back in the supine  position, awaken from anesthesia and transferred to the recovery room in  stable condition.           ______________________________  Clydene Fake, M.D.     JRH/MEDQ  D:  03/15/2008  T:  03/16/2008  Job:  [id number]

## 2011-03-03 NOTE — Op Note (Signed)
NAMECYSTAL, Bianca Fry                 ACCOUNT NO.:  [id number]   MEDICAL RECORD NO.:  [id number]          PATIENT TYPE:  OIB   LOCATION:  3534                         FACILITY:  [facility]   PHYSICIAN:  Clydene Fake, M.D.  DATE OF BIRTH:  Feb 21, 1946   DATE OF PROCEDURE:  11/29/2007  DATE OF DISCHARGE:  11/30/2007                               OPERATIVE REPORT   PREOPERATIVE DIAGNOSIS:  Herniated nucleus pulposus left L4-5.   POSTOPERATIVE DIAGNOSIS:  Herniated nucleus pulposus left L4-5.   PROCEDURE:  Left L4-5 semihemilaminectomy and diskectomy,  microdissection with microscope.   SURGEON:  Clydene Fake, M.D.   ASSISTANT:  Cristi Loron, M.D.   ANESTHESIA:  General endotracheal tube anesthesia.   BLOOD LOSS:  Minimal.   BLOOD GIVEN:  None.   DRAINS:  None.   COMPLICATIONS:  None.   INDICATIONS FOR PROCEDURE:  The patient is a 65 year old woman has had  severe left leg pain.  MRI was done showing a large disk herniation left  side L4-5 compressing the L5 root.  The patient is brought in for  decompression.   PROCEDURE IN DETAIL:  The patient was brought to the operating room and  general anesthesia was induced.  Patient was placed in prone position on  Wilson frame with all pressure points padded.  The patient was prepped  and draped in sterile fashion and site of incision injected with 20 mL  of 1% lidocaine with epinephrine.  Needle was placed in the interspace.  X-rays obtained showing needle was pointed over 4 spinous process at the  4-5 disk.  Incision was made, centered below where the needle was and  incision taken down to the fascia and hemostasis obtained with Bovie  cauterization.  On the left side, subperiosteal dissection was done over  the spinous process of lamina out to the facets.  Self-retaining  retractors placed.  Marker was placed at the 4-5 interspace and x-rays  obtained confirming our positioning at L4-5.  High-speed drill was used  to start a  semihemilaminectomy, medial facetectomy completed with  Kerrison punches, ligament flavum was removed.  Foraminotomy was done  over the 5 root and we dissected in the epidural space.  Got hemostasis  with bipolar cauterization. Retracted the dura and nerve root medially  and gently.  Large subligamentous disk herniation.  Disk space incised  and diskectomy performed with pituitary rongeurs and curettes.  When we  were finished we had good decompression of the nerve root __________ .  We had good decompression of both the 4 and 5 roots.  We had good  hemostasis.  We irrigated with  antibiotic solution.  Retractors removed.  Fascia closed with 0 Vicryl  interrupted sutures.  Subcutaneous tissue closed with the same.  Skin  closed with benzoin and Steri-Strips, dressing was placed.  The patient  was placed back in the supine position, awoken from anesthesia and  transferred to the recovery room in stable condition.           ______________________________  Clydene Fake, M.D.     JRH/MEDQ  D:  11/29/2007  T:  11/30/2007  Job:  [id number]

## 2011-03-06 NOTE — Discharge Summary (Signed)
NAMEGERALYN, Bianca Fry                 ACCOUNT NO.:  [id number]   MEDICAL RECORD NO.:  [id number]          PATIENT TYPE:  INP   LOCATION:  3020                         FACILITY:  [facility]   PHYSICIAN:  Clydene Fake, M.D.  DATE OF BIRTH:  Dec 26, 1945   DATE OF ADMISSION:  03/15/2008  DATE OF DISCHARGE:  03/19/2008                               DISCHARGE SUMMARY   DIAGNOSES:  1. Recurrent history and physical.  2. Spondylosis.  3. Degenerative disk disease.  4. Stenosis L4-L5.   DISCHARGE DIAGNOSES:  1. Recurrent history and physical.  2. Spondylosis.  3. Degenerative disk disease.  4. Stenosis L4-L5.   PROCEDURE:  Redo decompressive laminectomy L4-L5 with posterior lumbar  interbody fusion at L4-L5 __________ interbody cages at L4-L5 with  EXPEDIUM pedicle screw fixation L4-L5, posterolateral fusion of L4-L5  with an infuse.   REASON FOR ADMISSION:  The patient is a 65 year old woman who has had  back pain and left leg pain, numbness, weakness with L4-L5 root  involvements.  She has had two recurrent disk herniations __________.   PAST SURGERIES:  Significant collapse of the disk space.  The patient  brought in for decompression and fusion of the area.   HOSPITAL COURSE:  The patient was admitted, on the day of surgery  underwent procedure above without complications.  The patient was  transferred to the recovery room and then to the floor.  Next day, she  noted much less leg pain.  Motor and sensation exam were intact, minimal  drainage, and she has had increasing activity, working with therapy and  dropped the brace.  Continued doing well and continued increasing  activity.  PT/OT is to recommend the patient for increased ambulation.  She continued making good progress.  By March 19, 2008, much less pain in  both back and leg, was tolerating a diet.  Incision was clean, dry, and  intact.  She was doing well and she was discharged home on Dilaudid,  Flexeril p.o., and Cipro for 5  days.   Follow up in the office in 2-4 weeks.  No strenuous activity, up with  brace, and diet as tolerated.  Resume all prehospitalization  medications.           ______________________________  Clydene Fake, M.D.    JRH/MEDQ  D:  04/12/2008  T:  04/12/2008  Job:  [id number]

## 2011-04-09 ENCOUNTER — Encounter: Payer: Self-pay | Admitting: Internal Medicine

## 2011-04-09 ENCOUNTER — Ambulatory Visit (INDEPENDENT_AMBULATORY_CARE_PROVIDER_SITE_OTHER): Payer: Medicare Other | Admitting: Internal Medicine

## 2011-04-09 VITALS — BP 136/80 | HR 76 | Temp 98.2°F | Resp 16 | Ht 64.0 in | Wt 149.0 lb

## 2011-04-09 DIAGNOSIS — S5000XA Contusion of unspecified elbow, initial encounter: Secondary | ICD-10-CM

## 2011-04-09 DIAGNOSIS — S5001XA Contusion of right elbow, initial encounter: Secondary | ICD-10-CM

## 2011-04-09 DIAGNOSIS — S7000XA Contusion of unspecified hip, initial encounter: Secondary | ICD-10-CM

## 2011-04-09 DIAGNOSIS — Z23 Encounter for immunization: Secondary | ICD-10-CM

## 2011-04-09 NOTE — Progress Notes (Signed)
Addended by: Willy Eddy on: 04/09/2011 10:03 AM   Modules accepted: Orders

## 2011-04-09 NOTE — Progress Notes (Signed)
Subjective:    Patient ID: Bianca Fry, female    DOB: [birth year], 65 y.o.   MRN: [id number]  HPI patient is a 65 year old white female with a history of hyperlipidemia hypertension and osteoarthritis.  She states that while walking her dog she tripped and fell with contusions and abrasions to both knees and a severe abrasion to her right elbow.  She also had a bruise on her right hip.  She states that the abrasions on the knees and swelling on the knees have largely resolved the hematoma on the hip is resolving and there was no skin breakdown she had significant abrasion on the right elbow approximately 4 x 7 cm in size.  Her tetanus will be due next year therefore we will immunize her at this time    Review of Systems  Constitutional: Negative for activity change, appetite change and fatigue.  HENT: Negative for ear pain, congestion, neck pain, postnasal drip and sinus pressure.   Eyes: Negative for redness and visual disturbance.  Respiratory: Negative for cough, shortness of breath and wheezing.   Gastrointestinal: Negative for abdominal pain and abdominal distention.  Genitourinary: Negative for dysuria, frequency and menstrual problem.  Musculoskeletal: Negative for myalgias, joint swelling and arthralgias.  Skin: Negative for rash and wound.  Neurological: Negative for dizziness, weakness and headaches.  Hematological: Negative for adenopathy. Does not bruise/bleed easily.  Psychiatric/Behavioral: Negative for sleep disturbance and decreased concentration.   Past Medical History  Diagnosis Date  . GERD (gastroesophageal reflux disease)   . IBS (irritable bowel syndrome)   . Hypertension   . Allergy   . Hyperlipidemia    Past Surgical History  Procedure Date  . Back injections   . Exploratory laparotomy   . Laparoscopic lysis intestinal adhesions   . Cholecystectomy   . Appendectomy   . Tonsillectomy   . Abdominal hysterectomy   . Lumbar laminectomy and disectomy      reports that she has never smoked. She does not have any smokeless tobacco history on file. She reports that she does not drink alcohol or use illicit drugs. family history includes Arthritis in an unspecified family member; Hypertension in an unspecified family member; and Stroke in her father.  She is adopted. Allergies  Allergen Reactions  . Carbamazepine     REACTION: Headache Nausea  . Clarithromycin   . Codeine     REACTION: Chest pain  . Nitrofurantoin   . Penicillins     REACTION: Rash  . Sulfonamide Derivatives     REACTION: GI upset       Objective:   Physical Exam  Constitutional: She is oriented to person, place, and time. She appears well-developed and well-nourished. No distress.  HENT:  Head: Normocephalic and atraumatic.  Right Ear: External ear normal.  Left Ear: External ear normal.  Nose: Nose normal.  Mouth/Throat: Oropharynx is clear and moist.  Eyes: Conjunctivae and EOM are normal. Pupils are equal, round, and reactive to light.  Neck: Normal range of motion. Neck supple. No JVD present. No tracheal deviation present. No thyromegaly present.  Cardiovascular: Normal rate, regular rhythm, normal heart sounds and intact distal pulses.   No murmur heard. Pulmonary/Chest: Effort normal and breath sounds normal. She has no wheezes. She exhibits no tenderness.  Abdominal: Soft. Bowel sounds are normal.  Musculoskeletal: Normal range of motion. She exhibits no edema and no tenderness.  Lymphadenopathy:    She has no cervical adenopathy.  Neurological: She is alert and oriented to person, place,  and time. She has normal reflexes. No cranial nerve deficit.  Skin: She is not diaphoretic.       Healing abrasions on both knees a 4 x 7 cm abrasion on the right elbow with tenderness on the elbow and some limitation to range of motion hematoma on the right hip approximately 5 x 8 cm  Psychiatric: She has a normal mood and affect. Her behavior is normal.           Assessment & Plan:  Significant abrasions and hematoma to the hip and need for tetanus immunization.  A Silvadene cream mixture was compounded for the patient and applications were caught just applied a mixture twice a day to the which is like a friction burn.  Will obtain an x-ray of her elbow to make sure that there is no fracture or Will call patient with results of the x-ray she will continue to monitor wound care if there is any erythema or aching from the side of her office

## 2011-04-10 ENCOUNTER — Ambulatory Visit (INDEPENDENT_AMBULATORY_CARE_PROVIDER_SITE_OTHER)
Admission: RE | Admit: 2011-04-10 | Discharge: 2011-04-10 | Disposition: A | Discharge status: Home / Self Care | Payer: Medicare Other | Source: Ambulatory Visit | Attending: Internal Medicine | Admitting: Internal Medicine

## 2011-04-10 DIAGNOSIS — S5001XA Contusion of right elbow, initial encounter: Secondary | ICD-10-CM

## 2011-04-10 DIAGNOSIS — S5000XA Contusion of unspecified elbow, initial encounter: Secondary | ICD-10-CM

## 2011-04-13 ENCOUNTER — Telehealth: Payer: Self-pay | Admitting: *Deleted

## 2011-04-13 NOTE — Telephone Encounter (Signed)
Pt would like lab results.  

## 2011-04-13 NOTE — Telephone Encounter (Signed)
Pt informed elbow exray ok

## 2011-07-02 ENCOUNTER — Ambulatory Visit (INDEPENDENT_AMBULATORY_CARE_PROVIDER_SITE_OTHER): Payer: Medicare Other | Admitting: Internal Medicine

## 2011-07-02 VITALS — BP 140/80 | HR 76 | Temp 98.2°F | Resp 16 | Ht 64.0 in | Wt 148.0 lb

## 2011-07-02 DIAGNOSIS — Z23 Encounter for immunization: Secondary | ICD-10-CM

## 2011-07-02 DIAGNOSIS — I10 Essential (primary) hypertension: Secondary | ICD-10-CM

## 2011-07-02 DIAGNOSIS — M6740 Ganglion, unspecified site: Secondary | ICD-10-CM

## 2011-07-02 NOTE — Progress Notes (Signed)
Subjective:    Patient ID: Bianca Fry, female    DOB: 10/08/1946, 65 y.o.   MRN: [id number]  HPI Knot on foot for a month that has increased in size Not painfull Patient has a history of hyperlipidemia and deficiency anemia and hypertension.  Her blood pressure is well-controlled today she was last in the office by Dr. Caryl Never      Review of Systems  Constitutional: Negative for activity change, appetite change and fatigue.  HENT: Negative for ear pain, congestion, neck pain, postnasal drip and sinus pressure.   Eyes: Negative for redness and visual disturbance.  Respiratory: Negative for cough, shortness of breath and wheezing.   Gastrointestinal: Negative for abdominal pain and abdominal distention.  Genitourinary: Negative for dysuria, frequency and menstrual problem.  Musculoskeletal: Negative for myalgias, joint swelling and arthralgias.  Skin: Negative for rash and wound.  Neurological: Negative for dizziness, weakness and headaches.  Hematological: Negative for adenopathy. Does not bruise/bleed easily.  Psychiatric/Behavioral: Negative for sleep disturbance and decreased concentration.   Past Medical History  Diagnosis Date  . GERD (gastroesophageal reflux disease)   . IBS (irritable bowel syndrome)   . Hypertension   . Allergy   . Hyperlipidemia    Past Surgical History  Procedure Date  . Back injections   . Exploratory laparotomy   . Laparoscopic lysis intestinal adhesions   . Cholecystectomy   . Appendectomy   . Tonsillectomy   . Abdominal hysterectomy   . Lumbar laminectomy and disectomy     reports that she has never smoked. She does not have any smokeless tobacco history on file. She reports that she does not drink alcohol or use illicit drugs. family history includes Arthritis in an unspecified family member; Hypertension in an unspecified family member; and Stroke in her father.  She is adopted. Allergies  Allergen Reactions  . Carbamazepine    REACTION: Headache Nausea  . Clarithromycin   . Codeine     REACTION: Chest pain  . Nitrofurantoin   . Penicillins     REACTION: Rash  . Sulfonamide Derivatives     REACTION: GI upset        Objective:   Physical Exam  Nursing note and vitals reviewed. Constitutional: She is oriented to person, place, and time. She appears well-developed and well-nourished. No distress.  HENT:  Head: Normocephalic and atraumatic.  Right Ear: External ear normal.  Left Ear: External ear normal.  Nose: Nose normal.  Mouth/Throat: Oropharynx is clear and moist.  Eyes: Conjunctivae and EOM are normal. Pupils are equal, round, and reactive to light.  Neck: Normal range of motion. Neck supple. No JVD present. No tracheal deviation present. No thyromegaly present.  Cardiovascular: Normal rate, regular rhythm, normal heart sounds and intact distal pulses.   No murmur heard. Pulmonary/Chest: Effort normal and breath sounds normal. She has no wheezes. She exhibits no tenderness.  Abdominal: Soft. Bowel sounds are normal.  Musculoskeletal: Normal range of motion. She exhibits no edema and no tenderness.  Lymphadenopathy:    She has no cervical adenopathy.  Neurological: She is alert and oriented to person, place, and time. She has normal reflexes. No cranial nerve deficit.  Skin: Skin is warm and dry. She is not diaphoretic.       Ganglion cyst on left foot  Psychiatric: She has a normal mood and affect. Her behavior is normal.          Assessment & Plan:  Aspirated ganglion cyst on left  Stable hypertension  stable GERD stable anxiety and current medications. Informed consent site was prepped with Betadine using an 18-gauge needle the ganglion cyst of the left foot was aspirated approximately half cc of viscous fluid was removed from the ganglion cyst post care instructions were given to the patient

## 2011-07-10 LAB — PROTIME-INR
INR: 0.9
Prothrombin Time: 12.4

## 2011-07-10 LAB — URINALYSIS, ROUTINE W REFLEX MICROSCOPIC
Hgb urine dipstick: NEGATIVE
Nitrite: NEGATIVE
Specific Gravity, Urine: 1.017
Urobilinogen, UA: 1

## 2011-07-10 LAB — BASIC METABOLIC PANEL
BUN: 9
CO2: 31
Calcium: 9.7
Creatinine, Ser: 0.79
GFR calc Af Amer: >60
Glucose, Bld: 104 — ABNORMAL HIGH

## 2011-07-10 LAB — CBC
MCHC: 33.6
MCV: 85.1
RDW: 14.1

## 2011-07-10 LAB — URINE MICROSCOPIC-ADD ON

## 2011-07-13 LAB — CBC
MCHC: 34
MCV: 84.3
Platelets: 218
RDW: 14.6

## 2011-07-13 LAB — BASIC METABOLIC PANEL
BUN: 14
CO2: 28
Calcium: 9.8
Chloride: 103
Creatinine, Ser: 0.8
GFR calc Af Amer: >60
Glucose, Bld: 98

## 2011-07-13 LAB — URINALYSIS, ROUTINE W REFLEX MICROSCOPIC
Glucose, UA: NEGATIVE
Hgb urine dipstick: NEGATIVE
Ketones, ur: NEGATIVE
Protein, ur: NEGATIVE
Urobilinogen, UA: 0.2

## 2011-07-13 LAB — PROTIME-INR
INR: 0.9
Prothrombin Time: 12.5

## 2011-07-13 LAB — URINE MICROSCOPIC-ADD ON

## 2011-07-15 ENCOUNTER — Ambulatory Visit (INDEPENDENT_AMBULATORY_CARE_PROVIDER_SITE_OTHER): Payer: Medicare Other | Admitting: Family Medicine

## 2011-07-15 ENCOUNTER — Encounter: Payer: Self-pay | Admitting: Family Medicine

## 2011-07-15 VITALS — BP 120/70 | Temp 98.2°F | Wt 149.0 lb

## 2011-07-15 DIAGNOSIS — T7840XA Allergy, unspecified, initial encounter: Secondary | ICD-10-CM

## 2011-07-15 LAB — BASIC METABOLIC PANEL
BUN: 9
Creatinine, Ser: 0.68
GFR calc non Af Amer: >60
Glucose, Bld: 96
Potassium: 3.4 — ABNORMAL LOW

## 2011-07-15 LAB — URINALYSIS, ROUTINE W REFLEX MICROSCOPIC
Bilirubin Urine: NEGATIVE
Glucose, UA: NEGATIVE
Ketones, ur: NEGATIVE
pH: 5.5

## 2011-07-15 LAB — CBC
HCT: 35.3 — ABNORMAL LOW
MCV: 85.4
Platelets: 263
RDW: 13

## 2011-07-15 LAB — URINE MICROSCOPIC-ADD ON

## 2011-07-15 LAB — ABO/RH: ABO/RH(D): O POS

## 2011-07-15 LAB — TYPE AND SCREEN: Antibody Screen: NEGATIVE

## 2011-07-15 LAB — PROTIME-INR: Prothrombin Time: 12.6

## 2011-07-15 NOTE — Patient Instructions (Signed)
Continue with Zyrtec and cool compresses. Follow up for any fever, pustules, or any worsening rash.

## 2011-07-15 NOTE — Progress Notes (Signed)
  Subjective:    Patient ID: Bianca Fry, female    DOB: [birth year], 65 y.o.   MRN: [id number]  HPI Patient seen with possible bite anterior neck. Occurred yesterday. She recalls walking to a spider web. Did not actually see any type of bite. She has some itching redness and minimal discomfort with burning today. Zyrtec this morning. Denies fever or chills. No pustules.  No vesicles.   Review of Systems  Constitutional: Negative for fever and chills.  Respiratory: Negative for cough, choking, shortness of breath and wheezing.   Skin: Positive for rash.  Neurological: Negative for headaches.  Hematological: Negative for adenopathy.       Objective:   Physical Exam  Constitutional: She appears well-developed and well-nourished.  Neck: Neck supple. No thyromegaly present.  Cardiovascular: Normal rate, regular rhythm and normal heart sounds.   Pulmonary/Chest: Effort normal and breath sounds normal. No respiratory distress. She has no wheezes. She has no rales.  Lymphadenopathy:    She has no cervical adenopathy.  Skin:       Area of erythema just left of midline anterior neck approximately 1 x 1 cm. Minimally indurated. No pustules. No vesicles. Nonfluctuant. Nontender          Assessment & Plan:  Probable localized allergic reaction, possibly to insect bite. No evidence for abscess. No evidence for cellulitis. Continue antihistamine with Zyrtec and cool compresses and followup as needed

## 2011-07-16 ENCOUNTER — Encounter: Payer: Self-pay | Admitting: Internal Medicine

## 2011-07-16 NOTE — Patient Instructions (Signed)
Keep the site wrapped with a pressure bandage today

## 2011-08-10 ENCOUNTER — Other Ambulatory Visit: Payer: Self-pay | Admitting: Internal Medicine

## 2011-08-31 ENCOUNTER — Encounter: Payer: Self-pay | Admitting: Internal Medicine

## 2011-08-31 ENCOUNTER — Ambulatory Visit (INDEPENDENT_AMBULATORY_CARE_PROVIDER_SITE_OTHER): Payer: Medicare Other | Admitting: Internal Medicine

## 2011-08-31 VITALS — BP 124/80 | HR 80 | Temp 98.2°F | Resp 16 | Ht 64.0 in | Wt 150.0 lb

## 2011-08-31 DIAGNOSIS — K219 Gastro-esophageal reflux disease without esophagitis: Secondary | ICD-10-CM

## 2011-08-31 DIAGNOSIS — IMO0002 Reserved for concepts with insufficient information to code with codable children: Secondary | ICD-10-CM

## 2011-08-31 DIAGNOSIS — F411 Generalized anxiety disorder: Secondary | ICD-10-CM

## 2011-08-31 DIAGNOSIS — F4321 Adjustment disorder with depressed mood: Secondary | ICD-10-CM

## 2011-08-31 DIAGNOSIS — F419 Anxiety disorder, unspecified: Secondary | ICD-10-CM

## 2011-08-31 DIAGNOSIS — B351 Tinea unguium: Secondary | ICD-10-CM

## 2011-08-31 DIAGNOSIS — E785 Hyperlipidemia, unspecified: Secondary | ICD-10-CM

## 2011-08-31 DIAGNOSIS — I10 Essential (primary) hypertension: Secondary | ICD-10-CM

## 2011-08-31 MED ORDER — CIPROFLOXACIN-HYDROCORTISONE 0.2-1 % OT SUSP: 3.0000 [drp] | Freq: Two times a day (BID) | OTIC | Status: AC

## 2011-08-31 MED ORDER — BENAZEPRIL-HYDROCHLOROTHIAZIDE 20-12.5 MG PO TABS: 1.0000 | ORAL_TABLET | Freq: Every day | ORAL | Status: DC

## 2011-08-31 MED ORDER — ALPRAZOLAM 0.25 MG PO TABS: 0.2500 mg | ORAL_TABLET | Freq: Three times a day (TID) | ORAL | Status: DC | PRN

## 2011-08-31 MED ORDER — ATORVASTATIN CALCIUM 20 MG PO TABS: 20.0000 mg | ORAL_TABLET | ORAL | Status: DC

## 2011-08-31 MED ORDER — OMEPRAZOLE 40 MG PO CPDR: 40.0000 mg | DELAYED_RELEASE_CAPSULE | Freq: Every day | ORAL | Status: DC

## 2011-08-31 NOTE — Patient Instructions (Signed)
The patient is instructed to continue all medications as prescribed. Schedule followup with check out clerk upon leaving the clinic  

## 2011-08-31 NOTE — Progress Notes (Signed)
  Subjective:    Patient ID: Bianca Fry, female    DOB: 06/27/1946, 65 y.o.   MRN: [id number]  HPI Follow up of depression stable Mild Anemia Ear pain in the left ear with sensation in face of numbness Hx of TMJ Blood pressure stable  Review of Systems  Constitutional: Negative for activity change, appetite change and fatigue.  HENT: Negative for ear pain, congestion, neck pain, postnasal drip and sinus pressure.   Eyes: Negative for redness and visual disturbance.  Respiratory: Negative for cough, shortness of breath and wheezing.   Gastrointestinal: Negative for abdominal pain and abdominal distention.  Genitourinary: Negative for dysuria, frequency and menstrual problem.  Musculoskeletal: Negative for myalgias, joint swelling and arthralgias.  Skin: Negative for rash and wound.  Neurological: Negative for dizziness, weakness and headaches.  Hematological: Negative for adenopathy. Does not bruise/bleed easily.  Psychiatric/Behavioral: Negative for sleep disturbance and decreased concentration.       Objective:   Physical Exam  Nursing note and vitals reviewed. Constitutional: She is oriented to person, place, and time. She appears well-developed and well-nourished. No distress.  HENT:  Head: Normocephalic and atraumatic.  Right Ear: External ear normal.  Left Ear: External ear normal.  Nose: Nose normal.  Mouth/Throat: Oropharynx is clear and moist.  Eyes: Conjunctivae and EOM are normal. Pupils are equal, round, and reactive to light.  Neck: Normal range of motion. Neck supple. No JVD present. No tracheal deviation present. No thyromegaly present.  Cardiovascular: Normal rate, regular rhythm, normal heart sounds and intact distal pulses.   No murmur heard. Pulmonary/Chest: Effort normal and breath sounds normal. She has no wheezes. She exhibits no tenderness.  Abdominal: Soft. Bowel sounds are normal.  Musculoskeletal: Normal range of motion. She exhibits no edema and no  tenderness.  Lymphadenopathy:    She has no cervical adenopathy.  Neurological: She is alert and oriented to person, place, and time. She has normal reflexes. No cranial nerve deficit.  Skin: Skin is warm and dry. She is not diaphoretic.  Psychiatric: She has a normal mood and affect. Her behavior is normal.          Assessment & Plan:  OM will call in cipro otic drops HTN stable Lipids at [facility] call in rx Anxiety stable

## 2011-09-15 ENCOUNTER — Ambulatory Visit (INDEPENDENT_AMBULATORY_CARE_PROVIDER_SITE_OTHER)
Admission: RE | Admit: 2011-09-15 | Discharge: 2011-09-15 | Disposition: A | Discharge status: Home / Self Care | Payer: Medicare Other | Source: Ambulatory Visit | Attending: Internal Medicine | Admitting: Internal Medicine

## 2011-09-15 ENCOUNTER — Telehealth: Payer: Self-pay

## 2011-09-15 DIAGNOSIS — R071 Chest pain on breathing: Secondary | ICD-10-CM

## 2011-09-15 DIAGNOSIS — R0789 Other chest pain: Secondary | ICD-10-CM

## 2011-09-15 NOTE — Telephone Encounter (Signed)
Per dr Lovell Sheehan - get cxr and ov with dr Lovell Sheehan on Thursday at 8"45-pt informed

## 2011-09-15 NOTE — Telephone Encounter (Signed)
Pt is complaining of pain in her left breast in the chest wall. At time it is a sharpe pain and then a dull achy pain.  The pain is across shoulder blade and neck.  Pt states she has had this before and was given a shot because she was told she had arthritis.  Pt states night night time is the worse time.  Pt has been taking aleve for the pain and it helps some.  Pt would like an appt asap.  Pls advise.

## 2011-09-15 NOTE — Telephone Encounter (Signed)
cxr adn ov on Thursday- pt informed

## 2011-09-17 ENCOUNTER — Encounter: Payer: Self-pay | Admitting: Internal Medicine

## 2011-09-17 ENCOUNTER — Ambulatory Visit (INDEPENDENT_AMBULATORY_CARE_PROVIDER_SITE_OTHER): Payer: Medicare Other | Admitting: Internal Medicine

## 2011-09-17 ENCOUNTER — Ambulatory Visit (INDEPENDENT_AMBULATORY_CARE_PROVIDER_SITE_OTHER)
Admission: RE | Admit: 2011-09-17 | Discharge: 2011-09-17 | Disposition: A | Discharge status: Home / Self Care | Payer: Medicare Other | Source: Ambulatory Visit | Attending: Internal Medicine | Admitting: Internal Medicine

## 2011-09-17 DIAGNOSIS — R0789 Other chest pain: Secondary | ICD-10-CM

## 2011-09-17 DIAGNOSIS — M542 Cervicalgia: Secondary | ICD-10-CM

## 2011-09-17 DIAGNOSIS — I10 Essential (primary) hypertension: Secondary | ICD-10-CM

## 2011-09-17 DIAGNOSIS — E785 Hyperlipidemia, unspecified: Secondary | ICD-10-CM

## 2011-09-17 MED ORDER — IBUPROFEN-FAMOTIDINE 800-26.6 MG PO TABS: 1.0000 | ORAL_TABLET | Freq: Two times a day (BID) | ORAL | Status: DC

## 2011-09-17 MED ORDER — METAXALONE 800 MG PO TABS: 800.0000 mg | ORAL_TABLET | Freq: Three times a day (TID) | ORAL | Status: DC

## 2011-09-17 NOTE — Progress Notes (Signed)
Subjective:    Patient ID: Bianca Fry, female    DOB: 27-Oct-1945, 65 y.o.   MRN: [id number]  HPI Patient has been having a three-week pattern of neck and upper chest discomfort worse when she lies flat not exertional not associated with sweating or nausea and does not radiate to her jaw comparing an EKG obtained today to previous EKG shows increased PACs but no significant ST-T wave changes. She does note increased anxiety/nervousness her blood pressure is slightly elevated today from her baseline and her pulse is increased  She does have a history of some cervical pain syndrome. Her cardiovascular risk factors are age cholesterol and hypertension    Review of Systems  Constitutional: Negative for activity change, appetite change and fatigue.  HENT: Negative for ear pain, congestion, neck pain, postnasal drip and sinus pressure.   Eyes: Negative for redness and visual disturbance.  Respiratory: Negative for cough, shortness of breath and wheezing.   Gastrointestinal: Negative for abdominal pain and abdominal distention.  Genitourinary: Negative for dysuria, frequency and menstrual problem.  Musculoskeletal: Negative for myalgias, joint swelling and arthralgias.  Skin: Negative for rash and wound.  Neurological: Negative for dizziness, weakness and headaches.  Hematological: Negative for adenopathy. Does not bruise/bleed easily.  Psychiatric/Behavioral: Negative for sleep disturbance and decreased concentration.   Past Medical History  Diagnosis Date  . GERD (gastroesophageal reflux disease)   . IBS (irritable bowel syndrome)   . Hypertension   . Allergy   . Hyperlipidemia     History   Social History  . Marital Status: Married    Spouse Name: N/A    Number of Children: N/A  . Years of Education: N/A   Occupational History  . Not on file.   Social History Main Topics  . Smoking status: Never Smoker   . Smokeless tobacco: Not on file  . Alcohol Use: No  . Drug Use: No   . Sexually Active: Yes   Other Topics Concern  . Not on file   Social History Narrative  . No narrative on file    Past Surgical History  Procedure Date  . Back injections   . Exploratory laparotomy   . Laparoscopic lysis intestinal adhesions   . Cholecystectomy   . Appendectomy   . Tonsillectomy   . Abdominal hysterectomy   . Lumbar laminectomy and disectomy     Family History  Problem Relation Age of Onset  . Adopted: Yes  . Arthritis    . Hypertension    . Stroke Father     Allergies  Allergen Reactions  . Carbamazepine     REACTION: Headache Nausea  . Clarithromycin   . Codeine     REACTION: Chest pain  . Nitrofurantoin   . Penicillins     REACTION: Rash  . Sulfonamide Derivatives     REACTION: GI upset    Current Outpatient Prescriptions on File Prior to Visit  Medication Sig Dispense Refill  . ALPRAZolam (XANAX) 0.25 MG tablet Take 1 tablet (0.25 mg total) by mouth 3 (three) times daily as needed for sleep.  90 tablet  3  . atorvastatin (LIPITOR) 20 MG tablet Take 1 tablet (20 mg total) by mouth 2 (two) times a week. 1 tab on Monday and friday  10 tablet  6  . benazepril-hydrochlorthiazide (LOTENSIN HCT) 20-12.5 MG per tablet Take 1 tablet by mouth daily.  90 tablet  3  . Biotin 10 MG TABS Take by mouth daily.        Marland Kitchen  fexofenadine (ALLEGRA) 180 MG tablet Take 180 mg by mouth daily.        . multivitamin (THERAGRAN) per tablet Take 1 tablet by mouth daily.        Marland Kitchen omeprazole (PRILOSEC) 40 MG capsule Take 1 capsule (40 mg total) by mouth daily.  30 capsule  11    BP 140/80  Pulse 86  Temp 98.2 F (36.8 C)  Resp 16  Ht 5\' 4"  (1.626 m)  Wt 152 lb (68.947 kg)  BMI 26.09 kg/m2       Objective:   Physical Exam  Nursing note and vitals reviewed. Constitutional: She is oriented to person, place, and time. She appears well-developed and well-nourished. No distress.  HENT:  Head: Normocephalic and atraumatic.  Right Ear: External ear normal.    Left Ear: External ear normal.  Nose: Nose normal.  Mouth/Throat: Oropharynx is clear and moist.  Eyes: Conjunctivae and EOM are normal. Pupils are equal, round, and reactive to light.  Neck: Normal range of motion. Neck supple. No JVD present. No tracheal deviation present. No thyromegaly present.  Cardiovascular: Normal rate, regular rhythm, normal heart sounds and intact distal pulses.   No murmur heard. Pulmonary/Chest: Effort normal and breath sounds normal. She has no wheezes. She exhibits no tenderness.  Abdominal: Soft. Bowel sounds are normal.  Musculoskeletal: Normal range of motion. She exhibits no edema and no tenderness.  Lymphadenopathy:    She has no cervical adenopathy.  Neurological: She is alert and oriented to person, place, and time. She has normal reflexes. No cranial nerve deficit.  Skin: Skin is warm and dry. She is not diaphoretic.  Psychiatric: She has a normal mood and affect. Her behavior is normal.          Assessment & Plan:  Patient has atypical chest pain but has increased cardiovascular risk factors of age hypertension and hyperlipidemia therefore would be prudent to order a stress Myoview especially in a female patient with atypical presentation.  For the cervical tension is present both on physical examination and history would recommend a muscle relaxant and anti-inflammatory for short course and followup in her office. Anxiety may be a part of this.

## 2011-09-17 NOTE — Patient Instructions (Signed)
The patient is instructed to continue all medications as prescribed. Schedule followup with check out clerk upon leaving the clinic  

## 2011-09-23 ENCOUNTER — Ambulatory Visit (HOSPITAL_COMMUNITY): Payer: Medicare Other | Attending: Cardiovascular Disease | Admitting: Radiology

## 2011-09-23 DIAGNOSIS — R002 Palpitations: Secondary | ICD-10-CM | POA: Insufficient documentation

## 2011-09-23 DIAGNOSIS — E785 Hyperlipidemia, unspecified: Secondary | ICD-10-CM | POA: Insufficient documentation

## 2011-09-23 DIAGNOSIS — I4949 Other premature depolarization: Secondary | ICD-10-CM

## 2011-09-23 DIAGNOSIS — R42 Dizziness and giddiness: Secondary | ICD-10-CM | POA: Insufficient documentation

## 2011-09-23 DIAGNOSIS — R5383 Other fatigue: Secondary | ICD-10-CM | POA: Insufficient documentation

## 2011-09-23 DIAGNOSIS — I10 Essential (primary) hypertension: Secondary | ICD-10-CM

## 2011-09-23 DIAGNOSIS — R079 Chest pain, unspecified: Secondary | ICD-10-CM

## 2011-09-23 DIAGNOSIS — R0789 Other chest pain: Secondary | ICD-10-CM | POA: Insufficient documentation

## 2011-09-23 DIAGNOSIS — I2510 Atherosclerotic heart disease of native coronary artery without angina pectoris: Secondary | ICD-10-CM | POA: Insufficient documentation

## 2011-09-23 DIAGNOSIS — R5381 Other malaise: Secondary | ICD-10-CM | POA: Insufficient documentation

## 2011-09-23 MED ORDER — TECHNETIUM TC 99M TETROFOSMIN IV KIT
33.0000 | PACK | Freq: Once | INTRAVENOUS | Status: AC | PRN
  Administered 2011-09-23: 33 via INTRAVENOUS

## 2011-09-23 MED ORDER — TECHNETIUM TC 99M TETROFOSMIN IV KIT
11.0000 | PACK | Freq: Once | INTRAVENOUS | Status: AC | PRN
  Administered 2011-09-23: 11 via INTRAVENOUS

## 2011-09-23 NOTE — Progress Notes (Signed)
[facility] SITE 3 NUCLEAR MED [address] [place] [address] [phone]  Cardiology Nuclear Med Study  Bianca Fry is a 65 y.o. female [id number] Mar 10, 1946   Nuclear Med Background Indication for Stress Test:  Evaluation for Ischemia History:  No previous documented CAD Cardiac Risk Factors: Hypertension and Lipids  Symptoms:  Chest Pain (last episode of chest discomfort is now, 3-4/10, "dull ache"), Dizziness with Palpitations, Fatigue and Palpitations   Nuclear Pre-Procedure Caffeine/Decaff Intake:  None NPO After: 7:30am   Lungs:  Clear. IV 0.9% NS with Angio Cath:  22g  IV Site: R Hand  IV Started by:  Bonnita Levan, RN  Chest Size (in):  34 Cup Size: D  Height: 5\' 4"  (1.626 m)  Weight:  150 lb (68.04 kg)  BMI:  Body mass index is 25.75 kg/(m^2). Tech Comments:  N/A    Nuclear Med Study 1 or 2 day study: 1 day  Stress Test Type:  Stress  Reading MD: Kristeen Miss, MD  Order Authorizing Provider:  Darryll Capers, MD  Resting Radionuclide: Technetium 16m Tetrofosmin  Resting Radionuclide Dose: 11.0 mCi   Stress Radionuclide:  Technetium 88m Tetrofosmin  Stress Radionuclide Dose: 33.0 mCi           Stress Protocol Rest HR: 60 Stress HR: 155  Rest BP: Sitting:109/64  Standing:118/67 Stress BP: 196/69  Exercise Time (min): 5:17 METS: 7.0   Predicted Max HR: 154 bpm % Max HR: 100.65 bpm Rate Pressure Product: 46962   Dose of Adenosine (mg):  n/a Dose of Lexiscan: n/a mg  Dose of Atropine (mg): n/a Dose of Dobutamine: n/a mcg/kg/min (at max HR)  Stress Test Technologist: Smiley Houseman, CMA-N  Nuclear Technologist:  Doyne Keel, CNMT     Rest Procedure:  Myocardial perfusion imaging was performed at rest 45 minutes following the intravenous administration of Technetium 74m Tetrofosmin.  Rest ECG: No acute changes.  Stress Procedure:  The patient exercised for 5:17 on the treadmill utilizing the Bruce protocol.  The patient stopped due to  fatigue and denied any chest pain.  There were marked ST-T wave changes, occasional PVC's with couplets and PAC's.  Technetium 39m Tetrofosmin was injected at peak exercise and myocardial perfusion imaging was performed after a brief delay.  Stress ECG: Insignificant upsloping ST segment depression.  QPS Raw Data Images:  Normal; no motion artifact; normal heart/lung ratio. Stress Images:  Normal homogeneous uptake in all areas of the myocardium. Rest Images:  Normal homogeneous uptake in all areas of the myocardium. Subtraction (SDS):  No evidence of ischemia. Transient Ischemic Dilatation (Normal <1.22):  1.01 Lung/Heart Ratio (Normal <0.45):  0.31  Quantitative Gated Spect Images QGS EDV:  46 ml QGS ESV:  9 ml QGS cine images:  NL LV Function; NL Wall Motion QGS EF: 80%  Impression Exercise Capacity:  Fair exercise capacity. BP Response:  Normal blood pressure response. Clinical Symptoms:  No chest pain. ECG Impression:  Insignificant upsloping ST segment depression. Comparison with Prior Nuclear Study: No previous nuclear study performed  Overall Impression:  Normal stress nuclear study.  There were minor ST changes on her ECG.  There is no evidence of ischemia.  The LV function is normal with an EF of 80%   Bianca Fry, Bianca Fry., MD, [facility] 09/23/2011, 5:56 PM

## 2011-09-29 ENCOUNTER — Other Ambulatory Visit: Payer: Self-pay | Admitting: *Deleted

## 2011-09-29 DIAGNOSIS — M47812 Spondylosis without myelopathy or radiculopathy, cervical region: Secondary | ICD-10-CM

## 2011-09-29 DIAGNOSIS — K219 Gastro-esophageal reflux disease without esophagitis: Secondary | ICD-10-CM

## 2011-10-20 DIAGNOSIS — M542 Cervicalgia: Secondary | ICD-10-CM | POA: Diagnosis not present

## 2011-10-20 DIAGNOSIS — M654 Radial styloid tenosynovitis [de Quervain]: Secondary | ICD-10-CM

## 2011-10-20 DIAGNOSIS — M159 Polyosteoarthritis, unspecified: Secondary | ICD-10-CM | POA: Diagnosis not present

## 2011-10-20 HISTORY — DX: Radial styloid tenosynovitis (de quervain): M65.4

## 2011-10-21 ENCOUNTER — Ambulatory Visit: Payer: Medicare Other | Admitting: Internal Medicine

## 2011-10-26 DIAGNOSIS — M654 Radial styloid tenosynovitis [de Quervain]: Secondary | ICD-10-CM | POA: Diagnosis not present

## 2011-10-27 DIAGNOSIS — H2510 Age-related nuclear cataract, unspecified eye: Secondary | ICD-10-CM | POA: Diagnosis not present

## 2011-10-27 DIAGNOSIS — H02059 Trichiasis without entropian unspecified eye, unspecified eyelid: Secondary | ICD-10-CM | POA: Diagnosis not present

## 2011-11-02 ENCOUNTER — Ambulatory Visit (INDEPENDENT_AMBULATORY_CARE_PROVIDER_SITE_OTHER): Payer: Medicare Other | Admitting: Internal Medicine

## 2011-11-02 ENCOUNTER — Encounter: Payer: Self-pay | Admitting: Internal Medicine

## 2011-11-02 VITALS — BP 130/70 | HR 72 | Temp 98.6°F | Resp 16 | Ht 64.0 in | Wt 150.0 lb

## 2011-11-02 DIAGNOSIS — E785 Hyperlipidemia, unspecified: Secondary | ICD-10-CM

## 2011-11-02 DIAGNOSIS — I10 Essential (primary) hypertension: Secondary | ICD-10-CM

## 2011-11-02 DIAGNOSIS — T887XXA Unspecified adverse effect of drug or medicament, initial encounter: Secondary | ICD-10-CM

## 2011-11-02 DIAGNOSIS — Z0000 Encounter for general adult medical examination without abnormal findings: Secondary | ICD-10-CM

## 2011-11-02 NOTE — Patient Instructions (Signed)
The patient is instructed to continue all medications as prescribed. Schedule followup with check out clerk upon leaving the clinic  

## 2011-11-02 NOTE — Progress Notes (Signed)
Subjective:    Patient ID: Bianca Fry, female    DOB: 08/30/1946, 66 y.o.   MRN: [id number]  HPI The pts neck pain has improved with the medication DUEXIS The pts blood pressure is stable Has been going to therapy and the pain is better but recently therapy.... The pt did pull downs and this recreated the pain We discussed   Review of Systems  Constitutional: Negative for activity change, appetite change and fatigue.  HENT: Negative for ear pain, congestion, neck pain, postnasal drip and sinus pressure.   Eyes: Negative for redness and visual disturbance.  Respiratory: Negative for cough, shortness of breath and wheezing.   Gastrointestinal: Negative for abdominal pain and abdominal distention.  Genitourinary: Negative for dysuria, frequency and menstrual problem.  Musculoskeletal: Negative for myalgias, joint swelling and arthralgias.  Skin: Negative for rash and wound.  Neurological: Negative for dizziness, weakness and headaches.  Hematological: Negative for adenopathy. Does not bruise/bleed easily.  Psychiatric/Behavioral: Negative for sleep disturbance and decreased concentration.   Past Medical History  Diagnosis Date  . GERD (gastroesophageal reflux disease)   . IBS (irritable bowel syndrome)   . Hypertension   . Allergy   . Hyperlipidemia     History   Social History  . Marital Status: Married    Spouse Name: N/A    Number of Children: N/A  . Years of Education: N/A   Occupational History  . Not on file.   Social History Main Topics  . Smoking status: Never Smoker   . Smokeless tobacco: Not on file  . Alcohol Use: No  . Drug Use: No  . Sexually Active: Yes   Other Topics Concern  . Not on file   Social History Narrative  . No narrative on file    Past Surgical History  Procedure Date  . Back injections   . Exploratory laparotomy   . Laparoscopic lysis intestinal adhesions   . Cholecystectomy   . Appendectomy   . Tonsillectomy   . Abdominal  hysterectomy   . Lumbar laminectomy and disectomy     Family History  Problem Relation Age of Onset  . Adopted: Yes  . Arthritis    . Hypertension    . Stroke Father     Allergies  Allergen Reactions  . Carbamazepine     REACTION: Headache Nausea  . Clarithromycin   . Codeine     REACTION: Chest pain  . Nitrofurantoin   . Penicillins     REACTION: Rash  . Sulfonamide Derivatives     REACTION: GI upset    Current Outpatient Prescriptions on File Prior to Visit  Medication Sig Dispense Refill  . ALPRAZolam (XANAX) 0.25 MG tablet Take 1 tablet (0.25 mg total) by mouth 3 (three) times daily as needed for sleep.  90 tablet  3  . atorvastatin (LIPITOR) 20 MG tablet Take 1 tablet (20 mg total) by mouth 2 (two) times a week. 1 tab on Monday and friday  10 tablet  6  . benazepril-hydrochlorthiazide (LOTENSIN HCT) 20-12.5 MG per tablet Take 1 tablet by mouth daily.  90 tablet  3  . Biotin 10 MG TABS Take by mouth daily.        . fexofenadine (ALLEGRA) 180 MG tablet Take 180 mg by mouth daily.        . Ibuprofen-Famotidine (DUEXIS) 800-26.6 MG TABS Take 1 tablet by mouth 2 (two) times daily.  90 tablet    . metaxalone (SKELAXIN) 800 MG tablet Take 1  tablet (800 mg total) by mouth 3 (three) times daily.  90 tablet  1  . multivitamin (THERAGRAN) per tablet Take 1 tablet by mouth daily.        Marland Kitchen omeprazole (PRILOSEC) 40 MG capsule Take 1 capsule (40 mg total) by mouth daily.  30 capsule  11    BP 130/70  Pulse 72  Temp 98.6 F (37 C)  Resp 16  Ht 5\' 4"  (1.626 m)  Wt 150 lb (68.04 kg)  BMI 25.75 kg/m2        Objective:   Physical Exam  Nursing note and vitals reviewed. Constitutional: She is oriented to person, place, and time. She appears well-developed and well-nourished. No distress.  HENT:  Head: Normocephalic and atraumatic.  Right Ear: External ear normal.  Left Ear: External ear normal.  Nose: Nose normal.  Mouth/Throat: Oropharynx is clear and moist.  Eyes:  Conjunctivae and EOM are normal. Pupils are equal, round, and reactive to light.  Neck: Normal range of motion. Neck supple. No JVD present. No tracheal deviation present. No thyromegaly present.  Cardiovascular: Normal rate, regular rhythm, normal heart sounds and intact distal pulses.   No murmur heard. Pulmonary/Chest: Effort normal and breath sounds normal. She has no wheezes. She exhibits no tenderness.  Abdominal: Soft. Bowel sounds are normal.  Musculoskeletal: Normal range of motion. She exhibits no edema and no tenderness.  Lymphadenopathy:    She has no cervical adenopathy.  Neurological: She is alert and oriented to person, place, and time. She has normal reflexes. No cranial nerve deficit.  Skin: Skin is warm and dry. She is not diaphoretic.  Psychiatric: She has a normal mood and affect. Her behavior is normal.          Assessment & Plan:  The pt had increased pain after PT that she attributes to the pull down exercise and the "bone" pressure applied. I gave her a copy of her x-ray and instructed her to show the physical therapist x-rays show that there will avoid direct pressure on the spurs in her neck and focus more on strengthening stretching and muscle relaxation type exercises.  Her blood pressure stable her current medications her allergies are stable she had an excellent result from the ibuprofen  combination and we gave her additional samples today

## 2011-11-10 ENCOUNTER — Other Ambulatory Visit: Payer: Self-pay | Admitting: Orthopedic Surgery

## 2011-11-12 ENCOUNTER — Encounter (HOSPITAL_BASED_OUTPATIENT_CLINIC_OR_DEPARTMENT_OTHER): Payer: Self-pay | Admitting: *Deleted

## 2011-11-12 NOTE — Pre-Procedure Instructions (Signed)
To come for BMET 

## 2011-11-16 ENCOUNTER — Encounter (HOSPITAL_BASED_OUTPATIENT_CLINIC_OR_DEPARTMENT_OTHER)
Admission: RE | Admit: 2011-11-16 | Discharge: 2011-11-16 | Disposition: A | Discharge status: Home / Self Care | Payer: Medicare Other | Source: Ambulatory Visit | Attending: Orthopedic Surgery | Admitting: Orthopedic Surgery

## 2011-11-16 DIAGNOSIS — M129 Arthropathy, unspecified: Secondary | ICD-10-CM | POA: Diagnosis not present

## 2011-11-16 DIAGNOSIS — I10 Essential (primary) hypertension: Secondary | ICD-10-CM | POA: Diagnosis not present

## 2011-11-16 DIAGNOSIS — M654 Radial styloid tenosynovitis [de Quervain]: Secondary | ICD-10-CM | POA: Diagnosis not present

## 2011-11-16 DIAGNOSIS — E785 Hyperlipidemia, unspecified: Secondary | ICD-10-CM | POA: Diagnosis not present

## 2011-11-16 DIAGNOSIS — K219 Gastro-esophageal reflux disease without esophagitis: Secondary | ICD-10-CM | POA: Diagnosis not present

## 2011-11-16 LAB — BASIC METABOLIC PANEL
GFR calc Af Amer: 77 mL/min — ABNORMAL LOW (ref >90)
GFR calc non Af Amer: 66 mL/min — ABNORMAL LOW (ref >90)
Potassium: 4.4 mEq/L (ref 3.5–5.1)
Sodium: 142 mEq/L (ref 135–145)

## 2011-11-16 NOTE — H&P (Signed)
Bianca Fry is an 66 y.o. female.   Chief Complaint: c/o chronic and progressive pain radial aspect of her right wrist HPI:  Kyandra returns for follow up evaluation of her right wrist deQuervain's STS. She had resolution following injection in the summer but over Thanksgiving banged her wrist directly over the first dorsal compartment. Since that time she has had recurrent STS symptoms. Her Finkelstein's is quite painful. She is tender to touch. I offered her a 2nd injection vs proceeding with release of the first dorsal compartment. After deliberation she decided to proceed with release first dorsal compartment.     Past Medical History  Diagnosis Date  . GERD (gastroesophageal reflux disease)   . Allergy   . Hyperlipidemia   . Arthritis     neck and shoulders, right fingers  . DeQuervain's disease (tenosynovitis) 10/2011    right wrist  . Hypertension     under control; has been on med. since 2009    Past Surgical History  Procedure Date  . Laparoscopic lysis intestinal adhesions 1999  . Cholecystectomy 1970  . Appendectomy 1970    at same time as gallbladder  . Abdominal hysterectomy 1985    partial  . Tonsillectomy 1984  . Cataract extraction 2009    right with lens implant  . Lumbar laminectomy/decompression microdiscectomy 11/29/2007; 12/29/2007; 03/15/2008    left L4-5; fusion 5/09 surgery  . Anterior and posterior vaginal repair 12/31/2009    with TVT sling and cysto    Family History  Problem Relation Age of Onset  . Adopted: Yes  . Arthritis    . Hypertension    . Stroke Father    Social History:  reports that she has never smoked. She has never used smokeless tobacco. She reports that she does not drink alcohol or use illicit drugs.  Allergies:  Allergies  Allergen Reactions  . Codeine Other (See Comments)    Chest pain  . Nitrofurantoin Other (See Comments)    Chest pain  . Percocet (Oxycodone-Acetaminophen) Nausea And Vomiting  . Propoxyphene And  Methadone Nausea And Vomiting  . Vicodin (Hydrocodone-Acetaminophen) Nausea And Vomiting  . Clarithromycin Other (See Comments)    Abd. cramps  . Penicillins Rash  . Prednisone Other (See Comments)    UTI  . Sulfonamide Derivatives Other (See Comments)    GI upset    No current facility-administered medications on file as of .   Medications Prior to Admission  Medication Sig Dispense Refill  . ALPRAZolam (XANAX) 0.25 MG tablet Take 1 tablet (0.25 mg total) by mouth 3 (three) times daily as needed for sleep.  90 tablet  3  . atorvastatin (LIPITOR) 20 MG tablet Take 1 tablet (20 mg total) by mouth 2 (two) times a week. 1 tab on Monday and friday  10 tablet  6  . benazepril-hydrochlorthiazide (LOTENSIN HCT) 20-12.5 MG per tablet Take 1 tablet by mouth daily.  90 tablet  3  . Biotin 10 MG TABS Take by mouth daily.        Marland Kitchen docusate sodium (COLACE) 100 MG capsule Take 100 mg by mouth 2 (two) times daily.      . Ibuprofen-Famotidine (DUEXIS) 800-26.6 MG TABS Take 1 tablet by mouth 2 (two) times daily.  90 tablet    . metaxalone (SKELAXIN) 800 MG tablet Take 1 tablet (800 mg total) by mouth 3 (three) times daily.  90 tablet  1  . multivitamin (THERAGRAN) per tablet Take 1 tablet by mouth daily.        Marland Kitchen  omeprazole (PRILOSEC) 40 MG capsule Take 1 capsule (40 mg total) by mouth daily.  30 capsule  11  . fexofenadine (ALLEGRA) 180 MG tablet Take 180 mg by mouth daily.          Results for orders placed during the hospital encounter of 11/17/11 (from the past 48 hour(s))  BASIC METABOLIC PANEL     Status: Abnormal   Collection Time   11/16/11 10:00 AM      Component Value Range Comment   Sodium 142  135 - 145 (mEq/L)    Potassium 4.4  3.5 - 5.1 (mEq/L)    Chloride 106  96 - 112 (mEq/L)    CO2 28  19 - 32 (mEq/L)    Glucose, Bld 119 (*) 70 - 99 (mg/dL)    BUN 14  6 - 23 (mg/dL)    Creatinine, Ser 1.19  0.50 - 1.10 (mg/dL)    Calcium 9.6  8.4 - 10.5 (mg/dL)    GFR calc non Af Amer 66 (*)  >90 (mL/min)    GFR calc Af Amer 77 (*) >90 (mL/min)     No results found.   Pertinent items are noted in HPI.  Height 5\' 4"  (1.626 m), weight 68.04 kg (150 lb).  General appearance: alert Head: Normocephalic, without obvious abnormality Neck: supple, symmetrical, trachea midline Resp: clear to auscultation bilaterally Cardio: regular rate and rhythm, S1, S2 normal, no murmur, click, rub or gallop GI: normal findings: bowel sounds normal Extremities: Examination of the right wrist revealed a positive Finkelstein's. She has full range of motion of her digits. Her neurovascular  is intact. Pulses: 2+ and symmetric Skin: normal Neurologic: Grossly normal    Assessment/Plan Impression: Chronic DeQuervain's right wrist  Plan: To OR for release right 1st dorsal compartment. The procedure, risks and post-op course were discussed at length and the patient was in agreement with the plan. DASNOIT,Jheri Mitter J 11/16/2011, 4:05 PM   H&P documentation: 11/17/2011  -History and Physical Reviewed  -Patient has been re-examined  -No change in the plan of care  Wyn Forster, MD

## 2011-11-17 ENCOUNTER — Encounter (HOSPITAL_BASED_OUTPATIENT_CLINIC_OR_DEPARTMENT_OTHER): Payer: Self-pay | Admitting: Anesthesiology

## 2011-11-17 ENCOUNTER — Encounter (HOSPITAL_BASED_OUTPATIENT_CLINIC_OR_DEPARTMENT_OTHER): Payer: Self-pay | Admitting: *Deleted

## 2011-11-17 ENCOUNTER — Encounter (HOSPITAL_BASED_OUTPATIENT_CLINIC_OR_DEPARTMENT_OTHER)
Admission: RE | Disposition: A | Discharge status: Home Health | Payer: Self-pay | Source: Ambulatory Visit | Attending: Orthopedic Surgery

## 2011-11-17 ENCOUNTER — Ambulatory Visit (HOSPITAL_BASED_OUTPATIENT_CLINIC_OR_DEPARTMENT_OTHER)
Admission: RE | Admit: 2011-11-17 | Discharge: 2011-11-17 | Disposition: A | Discharge status: Home Health | Payer: Medicare Other | Source: Ambulatory Visit | Attending: Orthopedic Surgery | Admitting: Orthopedic Surgery

## 2011-11-17 ENCOUNTER — Ambulatory Visit (HOSPITAL_BASED_OUTPATIENT_CLINIC_OR_DEPARTMENT_OTHER): Payer: Medicare Other | Admitting: Anesthesiology

## 2011-11-17 DIAGNOSIS — M65839 Other synovitis and tenosynovitis, unspecified forearm: Secondary | ICD-10-CM | POA: Diagnosis not present

## 2011-11-17 DIAGNOSIS — K219 Gastro-esophageal reflux disease without esophagitis: Secondary | ICD-10-CM | POA: Diagnosis not present

## 2011-11-17 DIAGNOSIS — M129 Arthropathy, unspecified: Secondary | ICD-10-CM | POA: Insufficient documentation

## 2011-11-17 DIAGNOSIS — I10 Essential (primary) hypertension: Secondary | ICD-10-CM | POA: Diagnosis not present

## 2011-11-17 DIAGNOSIS — M654 Radial styloid tenosynovitis [de Quervain]: Secondary | ICD-10-CM | POA: Insufficient documentation

## 2011-11-17 DIAGNOSIS — E785 Hyperlipidemia, unspecified: Secondary | ICD-10-CM | POA: Diagnosis not present

## 2011-11-17 HISTORY — DX: Unspecified osteoarthritis, unspecified site: M19.90

## 2011-11-17 HISTORY — PX: DORSAL COMPARTMENT RELEASE: SHX5039

## 2011-11-17 HISTORY — DX: Radial styloid tenosynovitis (de quervain): M65.4

## 2011-11-17 SURGERY — RELEASE, FIRST DORSAL COMPARTMENT, HAND
Anesthesia: Monitor Anesthesia Care | Site: Hand | Laterality: Right | Wound class: Clean

## 2011-11-17 MED ORDER — LIDOCAINE HCL 2 % IJ SOLN
INTRAMUSCULAR | Status: DC | PRN
  Administered 2011-11-17: 3 mL

## 2011-11-17 MED ORDER — CHLORHEXIDINE GLUCONATE 4 % EX LIQD: 60.0000 mL | Freq: Once | CUTANEOUS | Status: DC

## 2011-11-17 MED ORDER — TRAMADOL HCL 50 MG PO TABS
ORAL_TABLET | ORAL | Status: AC
Start: 2011-11-17 — End: 2011-11-27

## 2011-11-17 MED ORDER — MIDAZOLAM HCL 5 MG/5ML IJ SOLN
INTRAMUSCULAR | Status: DC | PRN
  Administered 2011-11-17: 1 mg via INTRAVENOUS

## 2011-11-17 MED ORDER — FENTANYL CITRATE 0.05 MG/ML IJ SOLN: 25.0000 ug | INTRAMUSCULAR | Status: DC | PRN

## 2011-11-17 MED ORDER — FENTANYL CITRATE 0.05 MG/ML IJ SOLN
INTRAMUSCULAR | Status: DC | PRN
  Administered 2011-11-17: 50 ug via INTRAVENOUS

## 2011-11-17 MED ORDER — PROPOFOL 10 MG/ML IV EMUL
INTRAVENOUS | Status: DC | PRN
  Administered 2011-11-17: 50 ug/kg/min via INTRAVENOUS

## 2011-11-17 MED ORDER — DEXAMETHASONE SODIUM PHOSPHATE 4 MG/ML IJ SOLN
INTRAMUSCULAR | Status: DC | PRN
  Administered 2011-11-17: 8 mg via INTRAVENOUS

## 2011-11-17 MED ORDER — TRAMADOL HCL 50 MG PO TABS
50.0000 mg | ORAL_TABLET | Freq: Once | ORAL | Status: AC
  Administered 2011-11-17: 50 mg via ORAL

## 2011-11-17 MED ORDER — ONDANSETRON HCL 4 MG/2ML IJ SOLN
INTRAMUSCULAR | Status: DC | PRN
  Administered 2011-11-17: 4 mg via INTRAVENOUS

## 2011-11-17 MED ORDER — LIDOCAINE HCL (CARDIAC) 20 MG/ML IV SOLN
INTRAVENOUS | Status: DC | PRN
  Administered 2011-11-17: 50 mg via INTRAVENOUS

## 2011-11-17 MED ORDER — PROMETHAZINE HCL 25 MG/ML IJ SOLN: 6.2500 mg | INTRAMUSCULAR | Status: DC | PRN

## 2011-11-17 MED ORDER — LACTATED RINGERS IV SOLN
INTRAVENOUS | Status: DC
  Administered 2011-11-17: 08:00:00 via INTRAVENOUS

## 2011-11-17 SURGICAL SUPPLY — 45 items
BANDAGE ELASTIC 3 VELCRO ST LF (GAUZE/BANDAGES/DRESSINGS) ×2 IMPLANT
BLADE MINI RND TIP GREEN BEAV (BLADE) IMPLANT
BLADE SURG 15 STRL LF DISP TIS (BLADE) ×1 IMPLANT
BLADE SURG 15 STRL SS (BLADE) ×2
BNDG CMPR 9X4 STRL LF SNTH (GAUZE/BANDAGES/DRESSINGS) ×1
BNDG ESMARK 4X9 LF (GAUZE/BANDAGES/DRESSINGS) ×1 IMPLANT
BRUSH SCRUB EZ PLAIN DRY (MISCELLANEOUS) ×2 IMPLANT
CLOTH BEACON ORANGE TIMEOUT ST (SAFETY) ×2 IMPLANT
CORDS BIPOLAR (ELECTRODE) ×1 IMPLANT
COVER MAYO STAND STRL (DRAPES) ×2 IMPLANT
COVER TABLE BACK 60X90 (DRAPES) ×2 IMPLANT
CUFF TOURNIQUET SINGLE 18IN (TOURNIQUET CUFF) ×1 IMPLANT
DECANTER SPIKE VIAL GLASS SM (MISCELLANEOUS) IMPLANT
DRAPE EXTREMITY T 121X128X90 (DRAPE) ×2 IMPLANT
DRAPE SURG 17X23 STRL (DRAPES) ×2 IMPLANT
DRSG TEGADERM 4X4.75 (GAUZE/BANDAGES/DRESSINGS) ×1 IMPLANT
GAUZE SPONGE 4X4 12PLY STRL LF (GAUZE/BANDAGES/DRESSINGS) ×1 IMPLANT
GLOVE BIO SURGEON STRL SZ 6.5 (GLOVE) ×1 IMPLANT
GLOVE BIOGEL M STRL SZ7.5 (GLOVE) ×2 IMPLANT
GLOVE BIOGEL PI IND STRL 7.0 (GLOVE) IMPLANT
GLOVE BIOGEL PI INDICATOR 7.0 (GLOVE) ×2
GLOVE EXAM NITRILE EXT CUFF MD (GLOVE) ×1 IMPLANT
GLOVE ORTHO TXT STRL SZ7.5 (GLOVE) ×2 IMPLANT
GLOVE SKINSENSE NS SZ7.0 (GLOVE) ×1
GLOVE SKINSENSE STRL SZ7.0 (GLOVE) IMPLANT
GOWN PREVENTION PLUS XLARGE (GOWN DISPOSABLE) ×3 IMPLANT
GOWN PREVENTION PLUS XXLARGE (GOWN DISPOSABLE) ×4 IMPLANT
NEEDLE 27GAX1X1/2 (NEEDLE) ×1 IMPLANT
PACK BASIN DAY SURGERY FS (CUSTOM PROCEDURE TRAY) ×2 IMPLANT
PAD CAST 3X4 CTTN HI CHSV (CAST SUPPLIES) IMPLANT
PADDING CAST ABS 4INX4YD NS (CAST SUPPLIES) ×1
PADDING CAST ABS COTTON 4X4 ST (CAST SUPPLIES) ×1 IMPLANT
PADDING CAST COTTON 3X4 STRL (CAST SUPPLIES)
SLEEVE SCD COMPRESS KNEE MED (MISCELLANEOUS) IMPLANT
SPONGE GAUZE 4X4 12PLY (GAUZE/BANDAGES/DRESSINGS) ×2 IMPLANT
STOCKINETTE 4X48 STRL (DRAPES) ×2 IMPLANT
STRIP CLOSURE SKIN 1/2X4 (GAUZE/BANDAGES/DRESSINGS) ×2 IMPLANT
SUT PROLENE 3 0 PS 2 (SUTURE) ×2 IMPLANT
SUT VIC AB 4-0 P-3 18XBRD (SUTURE) IMPLANT
SUT VIC AB 4-0 P3 18 (SUTURE)
SYR 3ML 23GX1 SAFETY (SYRINGE) IMPLANT
SYR CONTROL 10ML LL (SYRINGE) ×1 IMPLANT
TRAY DSU PREP LF (CUSTOM PROCEDURE TRAY) ×2 IMPLANT
UNDERPAD 30X30 INCONTINENT (UNDERPADS AND DIAPERS) ×2 IMPLANT
WATER STERILE IRR 1000ML POUR (IV SOLUTION) ×2 IMPLANT

## 2011-11-17 NOTE — Anesthesia Postprocedure Evaluation (Signed)
  Anesthesia Post-op Note  Patient: Bianca Fry  Procedure(s) Performed:  RELEASE DORSAL COMPARTMENT (DEQUERVAIN) - First dorsal compartment release  Patient Location: PACU  Anesthesia Type: MAC  Level of Consciousness: awake, alert  and oriented  Airway and Oxygen Therapy: Patient Spontanous Breathing  Post-op Pain: none  Post-op Assessment: Post-op Vital signs reviewed, Patient's Cardiovascular Status Stable, Respiratory Function Stable, Patent Airway, No signs of Nausea or vomiting and Pain level controlled  Post-op Vital Signs: Reviewed and stable  Complications: No apparent anesthesia complications

## 2011-11-17 NOTE — Op Note (Signed)
Op note dictated 11/17/11 [id number]

## 2011-11-17 NOTE — Transfer of Care (Signed)
Immediate Anesthesia Transfer of Care Note  Patient: Bianca Fry  Procedure(s) Performed:  RELEASE DORSAL COMPARTMENT (DEQUERVAIN) - First dorsal compartment release  Patient Location: PACU  Anesthesia Type: MAC  Level of Consciousness: awake, alert  and oriented  Airway & Oxygen Therapy: Patient Spontanous Breathing  Post-op Assessment: Report given to PACU RN and Post -op Vital signs reviewed and stable  Post vital signs: Reviewed and stable  Complications: No apparent anesthesia complications

## 2011-11-17 NOTE — Anesthesia Procedure Notes (Signed)
Procedure Name: MAC Performed by: Ameenah Prosser Pre-anesthesia Checklist: Patient identified, Timeout performed, Emergency Drugs available, Suction available and Patient being monitored Oxygen Delivery Method: Simple face mask     

## 2011-11-17 NOTE — Brief Op Note (Signed)
11/17/2011  8:49 AM  PATIENT:  Bianca Fry  66 y.o. female  PRE-OPERATIVE DIAGNOSIS:  Stenosing Tenosynovitis Right First Dorsal Compartment  POST-OPERATIVE DIAGNOSIS:  Stenosing Tenosynovitis Right First Dorsal compartment  PROCEDURE:  Procedure(s): RELEASE FIRST DORSAL COMPARTMENT (DEQUERVAIN RELEASE)  SURGEON:  Surgeon(s): Wyn Forster., MD  PHYSICIAN ASSISTANT:   ASSISTANTS: Mallory Shirk.A-C    ANESTHESIA:   Monitored anesthesia / 2% Lidocaine local and 1st compartment block  EBL: none  BLOOD ADMINISTERED:none  DRAINS: none   LOCAL MEDICATIONS USED:  XYLOCAINE 3 CC 2%  SPECIMEN:  No Specimen  DISPOSITION OF SPECIMEN:  N/A  COUNTS:  YES  TOURNIQUET:  * Missing tourniquet times found for documented tourniquets in log:  [id number] *  DICTATION: .Other Dictation: Dictation Number [id number]  PLAN OF CARE: Discharge to home after PACU  PATIENT DISPOSITION:  PACU - hemodynamically stable.

## 2011-11-17 NOTE — Anesthesia Preprocedure Evaluation (Signed)
Anesthesia Evaluation  Patient identified by MRN, date of birth, ID band Patient awake    Reviewed: Allergy & Precautions, H&P , NPO status , Patient's Chart, lab work & pertinent test results  History of Anesthesia Complications Negative for: history of anesthetic complications  Airway Mallampati: II TM Distance: >3 FB Neck ROM: Full    Dental No notable dental hx. (+) Teeth Intact, Caps and Dental Advisory Given   Pulmonary neg pulmonary ROS,  clear to auscultation  Pulmonary exam normal       Cardiovascular hypertension (took lisinopril today), Pt. on medications Regular Normal 12/12 stress myoview: no ischemia, EF 80%   Neuro/Psych Negative Neurological ROS     GI/Hepatic Neg liver ROS, GERD-  Medicated and Controlled,  Endo/Other  Negative Endocrine ROS  Renal/GU negative Renal ROS     Musculoskeletal   Abdominal   Peds  Hematology negative hematology ROS (+)   Anesthesia Other Findings   Reproductive/Obstetrics                           Anesthesia Physical Anesthesia Plan  ASA: II  Anesthesia Plan: MAC   Post-op Pain Management:    Induction: Intravenous  Airway Management Planned: Simple Face Mask  Additional Equipment:   Intra-op Plan:   Post-operative Plan:   Informed Consent: I have reviewed the patients History and Physical, chart, labs and discussed the procedure including the risks, benefits and alternatives for the proposed anesthesia with the patient or authorized representative who has indicated his/her understanding and acceptance.   Dental advisory given  Plan Discussed with: CRNA and Surgeon  Anesthesia Plan Comments: (Plan routine monitors, MAC)        Anesthesia Quick Evaluation

## 2011-11-17 NOTE — Op Note (Signed)
NAME:  MULKI, ROESLER                 ACCOUNT NO.:  [id number]  MEDICAL RECORD NO.:  [id number]  LOCATION:                                 FACILITY:  PHYSICIAN:  Katy Fitch. Kasondra Junod, M.D. DATE OF BIRTH:  01-Mar-1946  DATE OF PROCEDURE:  11/17/2011 DATE OF DISCHARGE:                              OPERATIVE REPORT   PREOPERATIVE DIAGNOSIS:  Painful stenosing tenosynovitis, right first dorsal compartment.  POSTOPERATIVE DIAGNOSIS:  Painful stenosing tenosynovitis, right first dorsal compartment.  OPERATION:  Release of first dorsal compartment and incidental synovectomy of abductor pollicis longus and extensor pollicis brevis tendons.  OPERATING SURGEON:  Katy Fitch. Lakecia Deschamps, MD  ASSISTANT:  Marveen Reeks Dasnoit, PA-C  ANESTHESIA:  2% lidocaine field block supplemented by IV sedation, total volume of 2% lidocaine 3 mL.  SUPERVISING ANESTHESIOLOGIST:  Germaine Pomfret, MD  INDICATIONS:  Bianca Fry is a 66 year old woman referred for evaluation and management of a painful right first dorsal compartment stenosing tenosynovitis.  She initially responded quite well to steroid injection.  Due to failure to respond to nonoperative measures during the past month, she returned and was noted to have recurrent stenosing tenosynovitis.  We offered repeat injection versus release of compartment.  She decided to proceed with release of the compartment.  On clinical examination, she had a markedly painful Finkelstein maneuver.  She was swollen at the first dorsal compartment and had pain with any ulnar deviation of the wrist.  We recommended proceeding with release of the first dorsal compartment with local anesthesia and sedation.  PROCEDURE:  Maaliyah Adolph was brought to room #6 of the [facility] and placed in supine position upon the operating table.  Following induction of IV sedation, the right arm was prepped with Betadine soap and solution, sterilely draped.  Following routine  time- out, 3 mL of 2% lidocaine were infiltrated into the path of the intended incision.  This was in addition to infiltration of the region of the first dorsal compartment.  Following exsanguination of the right arm with Esmarch bandage, arterial tourniquet was inflated to 220 mmHg. Procedure commenced with a short transverse incision directly over the first dorsal compartment.  Subcutaneous tissues were carefully divided taking care to gently identify the radial superficial sensory branches. These were gently retracted with Ragnell retractors followed by incision of the first dorsal compartment with scalpel and scissors.  The compartment was released followed by identification of 2 slips of the abductor pollicis longus and single slip of the extensor pollicis brevis.  A very diminutive extensor pollicis brevis was noted.  There was no septum.  Once the tendons were released, there was noted to be marked inflammatory tenosynovium.  This was gently dissected from the tendons with scissors and rongeur. Thereafter, free range of motion of the wrist and thumb was recovered.  The wound was repaired with intradermal 4-0 Prolene suture.  A compression dressing was applied with Steri-Strips, sterile gauze, and Tegaderm.  The wound and hand were then dressed with an Ace wrap.  There were no apparent complications.  For aftercare, despite multiple allergies, she is noted to be able to tolerate tramadol.  She is provided prescription for  tramadol 50 mg 1 p.o. q.4 hours p.r.n. pain, #20 tablets without refill.     Katy Fitch Christepher Melchior, M.D.     RVS/MEDQ  D:  11/17/2011  T:  11/17/2011  Job:  [id number]

## 2011-11-18 ENCOUNTER — Encounter (HOSPITAL_BASED_OUTPATIENT_CLINIC_OR_DEPARTMENT_OTHER): Payer: Self-pay | Admitting: Orthopedic Surgery

## 2011-11-19 DIAGNOSIS — R1032 Left lower quadrant pain: Secondary | ICD-10-CM | POA: Diagnosis not present

## 2011-11-19 LAB — URINALYSIS, COMPLETE
Bilirubin,UR: NEGATIVE
Glucose,UR: NEGATIVE mg/dL (ref 0–75)
Nitrite: NEGATIVE
Protein: NEGATIVE
RBC,UR: 1 /HPF (ref 0–5)

## 2011-11-19 LAB — COMPREHENSIVE METABOLIC PANEL
Alkaline Phosphatase: 96 U/L (ref 50–136)
Bilirubin,Total: 0.4 mg/dL (ref 0.2–1.0)
Calcium, Total: 9 mg/dL (ref 8.5–10.1)
Chloride: 101 mmol/L (ref 98–107)
Co2: 22 mmol/L (ref 21–32)
Creatinine: 0.89 mg/dL (ref 0.60–1.30)
EGFR (African American): >60
EGFR (Non-African Amer.): >60
Glucose: 123 mg/dL — ABNORMAL HIGH (ref 65–99)
Potassium: 3.2 mmol/L — ABNORMAL LOW (ref 3.5–5.1)
SGPT (ALT): 20 U/L
Sodium: 137 mmol/L (ref 136–145)
Total Protein: 7.5 g/dL (ref 6.4–8.2)

## 2011-11-19 LAB — PROTIME-INR
INR: 1
Prothrombin Time: 13.3 secs (ref 11.5–14.7)

## 2011-11-19 LAB — APTT: Activated PTT: 28.3 secs (ref 23.6–35.9)

## 2011-11-19 LAB — CBC
MCH: 28.5 pg (ref 26.0–34.0)
MCHC: 33.5 g/dL (ref 32.0–36.0)
MCV: 85 fL (ref 80–100)
Platelet: 204 10*3/uL (ref 150–440)
RBC: 4.72 10*6/uL (ref 3.80–5.20)

## 2011-11-20 ENCOUNTER — Inpatient Hospital Stay: Payer: Self-pay | Admitting: Internal Medicine

## 2011-11-20 DIAGNOSIS — K921 Melena: Secondary | ICD-10-CM | POA: Diagnosis not present

## 2011-11-20 DIAGNOSIS — B370 Candidal stomatitis: Secondary | ICD-10-CM | POA: Diagnosis not present

## 2011-11-20 DIAGNOSIS — Z881 Allergy status to other antibiotic agents status: Secondary | ICD-10-CM | POA: Diagnosis not present

## 2011-11-20 DIAGNOSIS — R197 Diarrhea, unspecified: Secondary | ICD-10-CM | POA: Diagnosis not present

## 2011-11-20 DIAGNOSIS — Z888 Allergy status to other drugs, medicaments and biological substances status: Secondary | ICD-10-CM | POA: Diagnosis not present

## 2011-11-20 DIAGNOSIS — R1032 Left lower quadrant pain: Secondary | ICD-10-CM | POA: Diagnosis not present

## 2011-11-20 DIAGNOSIS — M542 Cervicalgia: Secondary | ICD-10-CM | POA: Diagnosis present

## 2011-11-20 DIAGNOSIS — K219 Gastro-esophageal reflux disease without esophagitis: Secondary | ICD-10-CM | POA: Diagnosis present

## 2011-11-20 DIAGNOSIS — Z8249 Family history of ischemic heart disease and other diseases of the circulatory system: Secondary | ICD-10-CM | POA: Diagnosis not present

## 2011-11-20 DIAGNOSIS — K5289 Other specified noninfective gastroenteritis and colitis: Secondary | ICD-10-CM | POA: Diagnosis not present

## 2011-11-20 DIAGNOSIS — Z882 Allergy status to sulfonamides status: Secondary | ICD-10-CM | POA: Diagnosis not present

## 2011-11-20 DIAGNOSIS — E7800 Pure hypercholesterolemia, unspecified: Secondary | ICD-10-CM | POA: Diagnosis present

## 2011-11-20 DIAGNOSIS — Z823 Family history of stroke: Secondary | ICD-10-CM | POA: Diagnosis not present

## 2011-11-20 DIAGNOSIS — R112 Nausea with vomiting, unspecified: Secondary | ICD-10-CM | POA: Diagnosis not present

## 2011-11-20 DIAGNOSIS — I10 Essential (primary) hypertension: Secondary | ICD-10-CM | POA: Diagnosis not present

## 2011-11-20 DIAGNOSIS — F411 Generalized anxiety disorder: Secondary | ICD-10-CM | POA: Diagnosis present

## 2011-11-20 DIAGNOSIS — Z79899 Other long term (current) drug therapy: Secondary | ICD-10-CM | POA: Diagnosis not present

## 2011-11-20 DIAGNOSIS — R55 Syncope and collapse: Secondary | ICD-10-CM | POA: Diagnosis not present

## 2011-11-20 DIAGNOSIS — E860 Dehydration: Secondary | ICD-10-CM | POA: Diagnosis present

## 2011-11-20 DIAGNOSIS — Z880 Allergy status to penicillin: Secondary | ICD-10-CM | POA: Diagnosis not present

## 2011-11-20 DIAGNOSIS — G8929 Other chronic pain: Secondary | ICD-10-CM | POA: Diagnosis present

## 2011-11-20 DIAGNOSIS — E785 Hyperlipidemia, unspecified: Secondary | ICD-10-CM | POA: Diagnosis not present

## 2011-11-20 DIAGNOSIS — K648 Other hemorrhoids: Secondary | ICD-10-CM | POA: Diagnosis not present

## 2011-11-20 DIAGNOSIS — Z90710 Acquired absence of both cervix and uterus: Secondary | ICD-10-CM | POA: Diagnosis not present

## 2011-11-20 DIAGNOSIS — E876 Hypokalemia: Secondary | ICD-10-CM | POA: Diagnosis not present

## 2011-11-20 DIAGNOSIS — R933 Abnormal findings on diagnostic imaging of other parts of digestive tract: Secondary | ICD-10-CM | POA: Diagnosis not present

## 2011-11-20 DIAGNOSIS — D72829 Elevated white blood cell count, unspecified: Secondary | ICD-10-CM | POA: Diagnosis not present

## 2011-11-20 DIAGNOSIS — Z885 Allergy status to narcotic agent status: Secondary | ICD-10-CM | POA: Diagnosis not present

## 2011-11-20 DIAGNOSIS — Z9889 Other specified postprocedural states: Secondary | ICD-10-CM | POA: Diagnosis not present

## 2011-11-20 LAB — CK TOTAL AND CKMB (NOT AT ARMC)
CK, Total: 40 U/L (ref 21–215)
CK, Total: 61 U/L (ref 21–215)
CK-MB: <0.5 ng/mL — ABNORMAL LOW (ref 0.5–3.6)

## 2011-11-20 LAB — TROPONIN I: Troponin-I: <0.02 ng/mL

## 2011-11-20 LAB — WBCS, STOOL

## 2011-11-21 LAB — BASIC METABOLIC PANEL
BUN: 7 mg/dL (ref 7–18)
Calcium, Total: 8.2 mg/dL — ABNORMAL LOW (ref 8.5–10.1)
Co2: 27 mmol/L (ref 21–32)
EGFR (Non-African Amer.): >60
Glucose: 114 mg/dL — ABNORMAL HIGH (ref 65–99)
Osmolality: 280 (ref 275–301)
Potassium: 3.4 mmol/L — ABNORMAL LOW (ref 3.5–5.1)
Sodium: 141 mmol/L (ref 136–145)

## 2011-11-21 LAB — CK TOTAL AND CKMB (NOT AT ARMC): CK-MB: 0.6 ng/mL (ref 0.5–3.6)

## 2011-11-21 LAB — CBC WITH DIFFERENTIAL/PLATELET
Basophil %: 0.4 %
Eosinophil %: 0.6 %
HGB: 10.1 g/dL — ABNORMAL LOW (ref 12.0–16.0)
Lymphocyte %: 12.4 %
Monocyte %: 8.4 %
Neutrophil %: 78.2 %
RBC: 3.55 10*6/uL — ABNORMAL LOW (ref 3.80–5.20)
WBC: 14.9 10*3/uL — ABNORMAL HIGH (ref 3.6–11.0)

## 2011-11-21 LAB — TROPONIN I: Troponin-I: <0.02 ng/mL

## 2011-11-22 LAB — BASIC METABOLIC PANEL
BUN: 5 mg/dL — ABNORMAL LOW (ref 7–18)
Calcium, Total: 8 mg/dL — ABNORMAL LOW (ref 8.5–10.1)
EGFR (African American): >60
EGFR (Non-African Amer.): >60
Glucose: 106 mg/dL — ABNORMAL HIGH (ref 65–99)
Osmolality: 283 (ref 275–301)
Sodium: 143 mmol/L (ref 136–145)

## 2011-11-22 LAB — WBC: WBC: 11.1 10*3/uL — ABNORMAL HIGH (ref 3.6–11.0)

## 2011-11-23 LAB — POTASSIUM: Potassium: 3.4 mmol/L — ABNORMAL LOW (ref 3.5–5.1)

## 2011-11-24 LAB — POTASSIUM: Potassium: 3.9 mmol/L (ref 3.5–5.1)

## 2011-12-10 ENCOUNTER — Encounter: Payer: Self-pay | Admitting: Internal Medicine

## 2011-12-10 ENCOUNTER — Ambulatory Visit (INDEPENDENT_AMBULATORY_CARE_PROVIDER_SITE_OTHER): Payer: Medicare Other | Admitting: Internal Medicine

## 2011-12-10 VITALS — BP 130/80 | HR 76 | Temp 98.1°F | Resp 16 | Ht 64.0 in | Wt 144.0 lb

## 2011-12-10 DIAGNOSIS — K529 Noninfective gastroenteritis and colitis, unspecified: Secondary | ICD-10-CM | POA: Diagnosis not present

## 2011-12-10 DIAGNOSIS — Z8719 Personal history of other diseases of the digestive system: Secondary | ICD-10-CM | POA: Diagnosis not present

## 2011-12-10 LAB — CBC WITH DIFFERENTIAL/PLATELET
Basophils Absolute: 0 10*3/uL (ref 0.0–0.1)
Lymphocytes Relative: 31.6 % (ref 12.0–46.0)
Monocytes Relative: 8.8 % (ref 3.0–12.0)
Platelets: 265 10*3/uL (ref 150.0–400.0)
RDW: 13.8 % (ref 11.5–14.6)
WBC: 8.5 10*3/uL (ref 4.5–10.5)

## 2011-12-10 LAB — BASIC METABOLIC PANEL
CO2: 29 mEq/L (ref 19–32)
Chloride: 99 mEq/L (ref 96–112)
Potassium: 4.1 mEq/L (ref 3.5–5.1)
Sodium: 138 mEq/L (ref 135–145)

## 2011-12-10 NOTE — Progress Notes (Signed)
Subjective:    Patient ID: Bianca Fry, female    DOB: [birth year], 66 y.o.   MRN: [id number]  HPI patient went to the emergency room on January 31 for abdominal pain diarrhea and blood in her stool. She was admitted the next day to the hospital and treated with antibiotic therapy for possible colitis or flare of ulcerative colitis versus diverticulitis with bleed. She states that she was examined with a flexible sigmoidoscopy that was normal and a subsequent CT scan suggested  ulcerative colitis She has completed 2 weeks of Flagyl and ciprofloxacin. She has been extremely fatigued during this period of time and is just beginning to regain her strength She was not given asacol or prednisone or suppositories. She had a colonoscopy in  a flexible sigmoidoscopy in the interim has not had a full colon.  She still has several loose stools a day but has not had explosive diarrhea    Review of Systems  Constitutional: Negative for activity change, appetite change and fatigue.  HENT: Negative for ear pain, congestion, neck pain, postnasal drip and sinus pressure.   Eyes: Negative for redness and visual disturbance.  Respiratory: Negative for cough, shortness of breath and wheezing.   Gastrointestinal: Negative for abdominal pain and abdominal distention.  Genitourinary: Negative for dysuria, frequency and menstrual problem.  Musculoskeletal: Negative for myalgias, joint swelling and arthralgias.  Skin: Negative for rash and wound.  Neurological: Negative for dizziness, weakness and headaches.  Hematological: Negative for adenopathy. Does not bruise/bleed easily.  Psychiatric/Behavioral: Negative for sleep disturbance and decreased concentration.   Past Medical History  Diagnosis Date  . GERD (gastroesophageal reflux disease)   . Allergy   . Hyperlipidemia   . Arthritis     neck and shoulders, right fingers  . DeQuervain's disease (tenosynovitis) 10/2011    right wrist  . Hypertension    under control; has been on med. since 2009    History   Social History  . Marital Status: Married    Spouse Name: N/A    Number of Children: N/A  . Years of Education: N/A   Occupational History  . Not on file.   Social History Main Topics  . Smoking status: Never Smoker   . Smokeless tobacco: Never Used  . Alcohol Use: No  . Drug Use: No  . Sexually Active: Yes   Other Topics Concern  . Not on file   Social History Narrative  . No narrative on file    Past Surgical History  Procedure Date  . Laparoscopic lysis intestinal adhesions 1999  . Cholecystectomy 1970  . Appendectomy 1970    at same time as gallbladder  . Abdominal hysterectomy 1985    partial  . Tonsillectomy 1984  . Cataract extraction 2009    right with lens implant  . Lumbar laminectomy/decompression microdiscectomy 11/29/2007; 12/29/2007; 03/15/2008    left L4-5; fusion 5/09 surgery  . Anterior and posterior vaginal repair 12/31/2009    with TVT sling and cysto  . Dorsal compartment release 11/17/2011    Procedure: RELEASE DORSAL COMPARTMENT (DEQUERVAIN);  Surgeon: Wyn Forster., MD;  Location: [facility];  Service: Orthopedics;  Laterality: Right;  First dorsal compartment release    Family History  Problem Relation Age of Onset  . Adopted: Yes  . Arthritis    . Hypertension    . Stroke Father     Allergies  Allergen Reactions  . Codeine Other (See Comments)    Chest pain  .  Nitrofurantoin Other (See Comments)    Chest pain  . Percocet (Oxycodone-Acetaminophen) Nausea And Vomiting  . Propoxyphene And Methadone Nausea And Vomiting  . Vicodin (Hydrocodone-Acetaminophen) Nausea And Vomiting  . Clarithromycin Other (See Comments)    Abd. cramps  . Penicillins Rash  . Prednisone Other (See Comments)    UTI  . Sulfonamide Derivatives Other (See Comments)    GI upset    Current Outpatient Prescriptions on File Prior to Visit  Medication Sig Dispense Refill  .  ALPRAZolam (XANAX) 0.25 MG tablet Take 1 tablet (0.25 mg total) by mouth 3 (three) times daily as needed for sleep.  90 tablet  3  . atorvastatin (LIPITOR) 20 MG tablet Take 1 tablet (20 mg total) by mouth 2 (two) times a week. 1 tab on Monday and friday  10 tablet  6  . benazepril-hydrochlorthiazide (LOTENSIN HCT) 20-12.5 MG per tablet Take 1 tablet by mouth daily.  90 tablet  3  . Biotin 10 MG TABS Take by mouth daily.        Marland Kitchen docusate sodium (COLACE) 100 MG capsule Take 100 mg by mouth 2 (two) times daily.      . fexofenadine (ALLEGRA) 180 MG tablet Take 180 mg by mouth daily.        . Ibuprofen-Famotidine (DUEXIS) 800-26.6 MG TABS Take 1 tablet by mouth 2 (two) times daily.  90 tablet    . metaxalone (SKELAXIN) 800 MG tablet Take 1 tablet (800 mg total) by mouth 3 (three) times daily.  90 tablet  1  . multivitamin (THERAGRAN) per tablet Take 1 tablet by mouth daily.        Marland Kitchen omeprazole (PRILOSEC) 40 MG capsule Take 1 capsule (40 mg total) by mouth daily.  30 capsule  11    BP 130/80  Pulse 76  Temp 98.1 F (36.7 C)  Resp 16  Ht 5\' 4"  (1.626 m)  Wt 144 lb (65.318 kg)  BMI 24.72 kg/m2       Objective:   Physical Exam  Nursing note and vitals reviewed. Constitutional: She is oriented to person, place, and time. She appears well-developed and well-nourished. No distress.  HENT:  Head: Normocephalic and atraumatic.  Right Ear: External ear normal.  Left Ear: External ear normal.  Nose: Nose normal.  Mouth/Throat: Oropharynx is clear and moist.  Eyes: Conjunctivae and EOM are normal. Pupils are equal, round, and reactive to light.  Neck: Normal range of motion. Neck supple. No JVD present. No tracheal deviation present. No thyromegaly present.  Cardiovascular: Normal rate, regular rhythm, normal heart sounds and intact distal pulses.   No murmur heard. Pulmonary/Chest: Effort normal and breath sounds normal. She has no wheezes. She exhibits no tenderness.  Abdominal: Soft. Bowel  sounds are normal.  Musculoskeletal: Normal range of motion. She exhibits no edema and no tenderness.  Lymphadenopathy:    She has no cervical adenopathy.  Neurological: She is alert and oriented to person, place, and time. She has normal reflexes. No cranial nerve deficit.  Skin: Skin is warm and dry. She is not diaphoretic.  Psychiatric: She has a normal mood and affect. Her behavior is normal.          Assessment & Plan:  I believe she should be referred to gastroenterology for colonoscopy for evaluation of this diagnosis of ulcerative colitis. I believe however given her history and her course that this presentation was a diverticular bleed and diverticulitis.  We will presumptively move forward on that assumption that  she will require colonoscopy in about 4-6 weeks and I will put her on a low residue diet for the next few weeks  She has no prior history of ulcerative colitis. She was not placed on Asacol or any other protocol for ulcerative colitis. She was treated with protocol more likely used for diverticulitis We will repeat a CBC with differential today a basic metabolic panel to make sure her potassium is okay with the diarrhea and will treat her with a probiotic until we can have a colonoscopy to either rule out or confirm colitis.

## 2011-12-10 NOTE — Patient Instructions (Addendum)
Take align a probiotic one capsule a day and  see gastroenterologyLow Fiber and Residue Restricted Diet A low fiber diet restricts foods that contain carbohydrates that are not digested in the small intestine. A diet containing about 10 g of fiber is considered low fiber. The diet needs to be individualized to suit patient tolerances and preferences and to avoid unnecessary restrictions. Generally, the foods emphasized in a low fiber diet have no skins or seeds. They may have been processed to remove bran, germ, or husks. Cooking may not necessarily eliminate the fiber. Cooking may, in fact, enable a greater quantity of fiber to be consumed in a lesser volume. Legumes and nuts are also restricted. The term low residue has also been used to describe low fiber diets, although the two are not the same. Residue refers to any substance that adds to bowel (colonic) contents, such as sloughed cells and intestinal bacteria, in addition to fiber. Residue-containing foods, prunes and prune juice, milk, and connective tissue from meats may also need to be eliminated. It is important to eliminate these foods during sudden (acute) attacks of inflammatory bowel disease, when there is a partial obstruction due to another reason, or when minimal fecal output is desired. When these problems are gone, a more normal diet may be used. PURPOSE  Prevent blockage of a partially obstructed or narrowed gastrointestinal tract.   Reduce stool weight and volume.   Slow the movement of waste.  WHEN IS THIS DIET USED?  Acute phase of Crohn's disease, ulcerative colitis, regional enteritis, or diverticulitis.   Narrowing (stenosis) of intestinal or esophageal tubes (lumina).   Transitional diet following surgery, injury (trauma), or illness.  ADEQUACY This diet is nutritionally adequate based on individual food choices according to the Recommended Dietary Allowances of the [employer]. CHOOSING FOODS Check  labels, especially on foods from the starch list. Often, dietary fiber content is listed with the Nutrition Facts panel.  Breads and Starches  Allowed: White, [place], and pita breads, plain rolls, buns, or sweet rolls, doughnuts, waffles, pancakes, bagels. Plain muffins, sweet breads, biscuits, matzoth. Flour. Soda, saltine, or graham crackers. Pretzels, rusks, melba toast, zwieback. Cooked cereals: cornmeal, farina, cream cereals. Dry cereals: refined corn, wheat, rice, and oat cereals (check label). Potatoes prepared any way without skins, refined macaroni, spaghetti, noodles, refined rice.   Avoid: Bread, rolls, or crackers made with whole-wheat, multigrains, rye, bran seeds, nuts, or coconut. Corn tortillas, table-shells. Corn chips, tortilla chips. Cereals containing whole-grains, multigrains, bran, coconut, nuts, or raisins. Cooked or dry oatmeal. Coarse wheat cereals, granola. Cereals advertised as "high fiber." Potato skins. Whole-grain pasta, wild or brown rice. Popcorn.  Vegetables  Allowed:  Strained tomato and vegetable juices. Fresh: tender lettuce, cucumber, cabbage, spinach, bean sprouts. Cooked, canned: asparagus, bean sprouts, cut green or wax beans, cauliflower, pumpkin, beets, mushrooms, olives, spinach, yellow squash, tomato, tomato sauce (no seeds), zucchini (peeled), turnips. Canned sweet potatoes. Small amounts of celery, onion, radish, and green pepper may be used. Keep servings limited to  cup.   Avoid: Fresh, cooked, or canned: artichokes, baked beans, beet greens, broccoli, Brussels sprouts, French-style green beans, corn, kale, legumes, peas, sweet potatoes. Cooked: green or red cabbage, spinach. Avoid large servings of any vegetables.  Fruit  Allowed:  All fruit juices except prune juice. Cooked or canned: apricots applesauce, cantaloupe, cherries, grapefruit, grapes, kiwi, mandarin oranges, peaches, pears, fruit cocktail, pineapple, plums, watermelon. Fresh: banana,  grapes, cantaloupe, avocado, cherries, pineapple, grapefruit, kiwi, nectarines, peaches, oranges, blueberries, plums.  Keep servings limited to  cup or 1 piece.   Avoid: Fresh: apple with or without skin, apricots, mango, pears, raspberries, strawberries. Prune juice, stewed or dried prunes. Dried fruits, raisins, dates. Avoid large servings of all fresh fruits.  Meat and Meat Substitutes  Allowed:  Ground or well-cooked tender beef, ham, veal, lamb, pork, or poultry. Eggs, plain cheese. Fish, oysters, shrimp, lobster, other seafood. Liver, organ meats.   Avoid: Tough, fibrous meats with gristle. Peanut butter, smooth or chunky. Cheese with seeds, nuts, or other foods not allowed. Nuts, seeds, legumes, dried peas, beans, lentils.  Milk  Allowed:  All milk products except those not allowed. Milk and milk product consumption should be minimal when low residue is desired.   Avoid: Yogurt that contains nuts or seeds.  Soups and Combination Foods  Allowed:  Bouillon, broth, or cream soups made from allowed foods. Any strained soup. Casseroles or mixed dishes made with allowed foods.   Avoid: Soups made from vegetables that are not allowed or that contain other foods not allowed.  Desserts and Sweets  Allowed:  Plain cakes and cookies, pie made with allowed fruit, pudding, custard, cream pie. Gelatin, fruit, ice, sherbet, frozen ice pops. Ice cream, ice milk without nuts. Plain hard candy, honey, jelly, molasses, syrup, sugar, chocolate syrup, gumdrops, marshmallows.   Avoid: Desserts, cookies, or candies that contain nuts, peanut butter, or dried fruits. Jams, preserves with seeds, marmalade.  Fats and Oils  Allowed:  Margarine, butter, cream, mayonnaise, salad oils, plain salad dressings made from allowed foods. Plain gravy, crisp bacon without rind.   Avoid: Seeds, nuts, olives. Avocados.  Beverages  Allowed:  All, except those listed to avoid.   Avoid: Fruit juices with high pulp, prune  juice.  Condiments  Allowed:  Ketchup, mustard, horseradish, vinegar, cream sauce, cheese sauce, cocoa powder. Spices in moderation: allspice, basil, bay leaves, celery powder or leaves, cinnamon, cumin powder, curry powder, ginger, mace, marjoram, onion or garlic powder, oregano, paprika, parsley flakes, ground pepper, rosemary, sage, savory, tarragon, thyme, turmeric.   Avoid: Coconut, pickles.  SAMPLE MEAL PLAN The following menu is provided as a sample. Your daily menu plans will vary. Be sure to include a minimum of the following each day in order to provide essential nutrients for the adult:  Starch/Bread/Cereal Group, 6 servings.   Fruit/Vegetable Group, 5 servings.   Meat/Meat Substitute Group, 2 servings.   Milk/Milk Substitute Group, 2 servings.  A serving is equal to  cup for fruits, vegetables, and cooked cereals or 1 piece for foods such as a piece of bread, 1 orange, or 1 apple. For dry cereals and crackers, use serving sizes listed on the label. Combination foods may count as full or partial servings from various food groups. Fats, desserts, and sweets may be added to the meal plan after the requirements for essential nutrients are met. SAMPLE MENU Breakfast   cup orange juice.   1 boiled egg.   1 slice white toast.   Margarine.    cup cornflakes.   1 cup milk.   Beverage.  Lunch   cup chicken noodle soup.   2 to 3 oz sliced roast beef.   2 slices seedless rye bread.   Mayonnaise.    cup tomato juice.   1 small banana.   Beverage.  Dinner  3 oz baked chicken.    cup scalloped potatoes.    cup cooked beets.   White dinner roll.   Margarine.    cup canned  peaches.   Beverage.  Document Released: 03/27/2002 Document Revised: 06/17/2011 Document Reviewed: 02/18/2009 [facility] Patient Information 2012 [place], [place].

## 2011-12-15 ENCOUNTER — Encounter: Payer: Self-pay | Admitting: Gastroenterology

## 2012-01-01 DIAGNOSIS — H02409 Unspecified ptosis of unspecified eyelid: Secondary | ICD-10-CM | POA: Diagnosis not present

## 2012-01-01 DIAGNOSIS — H02009 Unspecified entropion of unspecified eye, unspecified eyelid: Secondary | ICD-10-CM | POA: Diagnosis not present

## 2012-01-04 ENCOUNTER — Ambulatory Visit: Payer: Medicare Other | Admitting: Gastroenterology

## 2012-01-05 ENCOUNTER — Encounter: Payer: Self-pay | Admitting: Gastroenterology

## 2012-01-08 ENCOUNTER — Encounter: Payer: Self-pay | Admitting: Internal Medicine

## 2012-01-08 ENCOUNTER — Ambulatory Visit (INDEPENDENT_AMBULATORY_CARE_PROVIDER_SITE_OTHER): Payer: Medicare Other | Admitting: Internal Medicine

## 2012-01-08 VITALS — BP 130/80 | HR 72 | Temp 98.2°F | Resp 16 | Ht 64.0 in | Wt 146.0 lb

## 2012-01-08 DIAGNOSIS — R109 Unspecified abdominal pain: Secondary | ICD-10-CM | POA: Diagnosis not present

## 2012-01-08 DIAGNOSIS — K5190 Ulcerative colitis, unspecified, without complications: Secondary | ICD-10-CM

## 2012-01-08 DIAGNOSIS — R1030 Lower abdominal pain, unspecified: Secondary | ICD-10-CM

## 2012-01-08 MED ORDER — ALIGN PO CAPS: 1.0000 | ORAL_CAPSULE | Freq: Every day | ORAL | Status: DC

## 2012-01-08 NOTE — Patient Instructions (Signed)
The patient is instructed to continue all medications as prescribed. Schedule followup with check out clerk upon leaving the clinic  

## 2012-01-08 NOTE — Progress Notes (Signed)
  Subjective:    Patient ID: Bianca Fry, female    DOB: June 29, 1946, 65 y.o.   MRN: [id number]  HPI His appointment with Dr. Ailene Ravel on April 15 there was a CT scan which suggested UC but her clinical course was closer to infectious diverticulosis She has noticed improvement in her bowel habits  Her presumed to their baseline Mood is down with this ilness She does not have a good history with the use of antidepresants     Review of Systems  Constitutional: Negative for activity change, appetite change and fatigue.  HENT: Negative for ear pain, congestion, neck pain, postnasal drip and sinus pressure.   Eyes: Negative for redness and visual disturbance.  Respiratory: Negative for cough, shortness of breath and wheezing.   Gastrointestinal: Negative for abdominal pain and abdominal distention.  Genitourinary: Negative for dysuria, frequency and menstrual problem.  Musculoskeletal: Negative for myalgias, joint swelling and arthralgias.  Skin: Negative for rash and wound.  Neurological: Negative for dizziness, weakness and headaches.  Hematological: Negative for adenopathy. Does not bruise/bleed easily.  Psychiatric/Behavioral: Negative for sleep disturbance and decreased concentration.       Objective:   Physical Exam  Vitals reviewed. Constitutional: She is oriented to person, place, and time. She appears well-developed and well-nourished. No distress.  HENT:  Head: Normocephalic and atraumatic.  Right Ear: External ear normal.  Left Ear: External ear normal.  Nose: Nose normal.  Mouth/Throat: Oropharynx is clear and moist.  Eyes: Conjunctivae and EOM are normal. Pupils are equal, round, and reactive to light.  Neck: Normal range of motion. Neck supple. No JVD present. No tracheal deviation present. No thyromegaly present.  Cardiovascular: Normal rate, regular rhythm, normal heart sounds and intact distal pulses.   No murmur heard. Pulmonary/Chest: Effort normal and breath  sounds normal. She has no wheezes. She exhibits no tenderness.  Abdominal: Soft. Bowel sounds are normal.  Musculoskeletal: Normal range of motion. She exhibits no edema and no tenderness.  Lymphadenopathy:    She has no cervical adenopathy.  Neurological: She is alert and oriented to person, place, and time. She has normal reflexes. No cranial nerve deficit.  Skin: Skin is warm and dry. She is not diaphoretic.  Psychiatric: She has a normal mood and affect. Her behavior is normal.          Assessment & Plan:  HTN stable question of UC referred to Dr Russella Dar for review. Moderate abdominal pain but much improved  Will start probiotic chronically

## 2012-02-01 ENCOUNTER — Encounter: Payer: Self-pay | Admitting: Gastroenterology

## 2012-02-01 ENCOUNTER — Ambulatory Visit (INDEPENDENT_AMBULATORY_CARE_PROVIDER_SITE_OTHER): Payer: Medicare Other | Admitting: Gastroenterology

## 2012-02-01 VITALS — BP 124/62 | HR 68 | Ht 64.0 in | Wt 147.0 lb

## 2012-02-01 DIAGNOSIS — R933 Abnormal findings on diagnostic imaging of other parts of digestive tract: Secondary | ICD-10-CM

## 2012-02-01 DIAGNOSIS — Z1211 Encounter for screening for malignant neoplasm of colon: Secondary | ICD-10-CM | POA: Diagnosis not present

## 2012-02-01 DIAGNOSIS — K219 Gastro-esophageal reflux disease without esophagitis: Secondary | ICD-10-CM | POA: Diagnosis not present

## 2012-02-01 MED ORDER — MOVIPREP 100 G PO SOLR: 1.0000 | Freq: Once | ORAL | Status: DC

## 2012-02-01 NOTE — Progress Notes (Signed)
History of Present Illness: This is a 66 year old female who I saw in 1999. Upper endoscopy in 1999 showed erosive gastritis. She states she had a colonoscopy performed at [facility] in 1999 that was normal. She was hospitalized at [facility] in February 2013 for acute bloody diarrhea and was felt to have ischemic colitis. An abnormal sigmoid colon was noted on CT scan. Flexible sigmoidoscopy was unremarkable. She was recommended a full colonoscopy. She has chronic GERD symptoms are well controlled on daily omeprazole. Denies weight loss, abdominal pain, constipation, diarrhea, change in stool caliber, melena, hematochezia, nausea, vomiting, dysphagia, reflux symptoms, chest pain.  Review of Systems: Pertinent positive and negative review of systems were noted in the above HPI section. All other review of systems were otherwise negative.  Current Medications, Allergies, Past Medical History, Past Surgical History, Family History and Social History were reviewed in [employer] record.  Physical Exam: General: Well developed , well nourished, no acute distress Head: Normocephalic and atraumatic Eyes:  sclerae anicteric, EOMI Ears: Normal auditory acuity Mouth: No deformity or lesions Neck: Supple, no masses or thyromegaly Lungs: Clear throughout to auscultation Heart: Regular rate and rhythm; no murmurs, rubs or bruits Abdomen: Soft, non tender and non distended. No masses, hepatosplenomegaly or hernias noted. Normal Bowel sounds Rectal: Deferred to colonoscopy Musculoskeletal: Symmetrical with no gross deformities  Skin: No lesions on visible extremities Pulses:  Normal pulses noted Extremities: No clubbing, cyanosis, edema or deformities noted Neurological: Alert oriented x 4, grossly nonfocal Cervical Nodes:  No significant cervical adenopathy Inguinal Nodes: No significant inguinal adenopathy Psychological:  Alert and cooperative. Normal mood and affect  Assessment and  Recommendations:  1. Resolved acute segmental colitis most likely ischemic.  2. Abnormal CT scan of the sigmoid colon secondary to #1. Flexible sigmoidoscopy was negative except for hemorrhoids at [facility].  3. Colorectal cancer screening. Last colonoscopy 1999 and she is overdue for screening colonoscopy. The risks, benefits, and alternatives to colonoscopy with possible biopsy and possible polypectomy were discussed with the patient and they consent to proceed.   4. GERD. Continue omeprazole 40 mg daily along with standard antireflux measures.

## 2012-02-01 NOTE — Patient Instructions (Signed)
You have been scheduled for a colonoscopy with propofol. Please follow written instructions given to you at your visit today.  Please pick up your prep kit at the pharmacy within the next 1-3 days. cc: Darryll Capers, MD

## 2012-02-08 ENCOUNTER — Encounter: Payer: Self-pay | Admitting: Family

## 2012-02-08 ENCOUNTER — Ambulatory Visit (INDEPENDENT_AMBULATORY_CARE_PROVIDER_SITE_OTHER): Payer: Medicare Other | Admitting: Family

## 2012-02-08 VITALS — BP 130/70 | Temp 97.5°F | Wt 149.0 lb

## 2012-02-08 DIAGNOSIS — H9209 Otalgia, unspecified ear: Secondary | ICD-10-CM

## 2012-02-08 DIAGNOSIS — H9202 Otalgia, left ear: Secondary | ICD-10-CM

## 2012-02-08 MED ORDER — KETOROLAC TROMETHAMINE 30 MG/ML IJ SOLN
60.0000 mg | Freq: Once | INTRAMUSCULAR | Status: AC
  Administered 2012-02-08: 60 mg via INTRAMUSCULAR

## 2012-02-09 ENCOUNTER — Encounter: Payer: Self-pay | Admitting: Family

## 2012-02-09 NOTE — Progress Notes (Signed)
Subjective:    Patient ID: Bianca Fry, female    DOB: September 03, 1946, 66 y.o.   MRN: [id number]  HPI 66 year old white female, nonsmoker, patient of Dr. Lovell Sheehan presents today with complaints of left ear pain. She describes it as a sharp pain occurring every 2-3 minutes. She recently and a 10 out of 10. His taken Aleve and Tylenol with no relief. Patient reports seeing a neurologist in the past for similar pain and etiology was unknown. She is also seen a dentist 2 years ago that cleared her from a dental standpoint. He does admit to having cavities to the left upper and lower mouth.   Review of Systems  Constitutional: Negative.   HENT: Positive for ear pain. Negative for congestion, sneezing, sinus pressure and ear discharge.   Respiratory: Negative.   Cardiovascular: Negative.   Musculoskeletal: Negative.   Skin: Negative.   Neurological: Positive for headaches.  Psychiatric/Behavioral: Negative.    Past Medical History  Diagnosis Date  . GERD (gastroesophageal reflux disease)   . Allergy   . Hyperlipidemia   . Arthritis     neck and shoulders, right fingers  . DeQuervain's disease (tenosynovitis) 10/2011    right wrist  . Hypertension     under control; has been on med. since 2009  . Erosive gastritis   . Hiatal hernia   . Internal hemorrhoids   . Colitis   . Anxiety     History   Social History  . Marital Status: Married    Spouse Name: N/A    Number of Children: 1  . Years of Education: N/A   Occupational History  . Retired    Social History Main Topics  . Smoking status: Never Smoker   . Smokeless tobacco: Never Used  . Alcohol Use: No  . Drug Use: No  . Sexually Active: Yes   Other Topics Concern  . Not on file   Social History Narrative   Daily caffeine     Past Surgical History  Procedure Date  . Laparoscopic lysis intestinal adhesions 1999  . Cholecystectomy 1970  . Appendectomy 1970    at same time as gallbladder  . Abdominal hysterectomy  1985    partial  . Tonsillectomy 1984  . Cataract extraction 2009    right with lens implant  . Lumbar laminectomy/decompression microdiscectomy 11/29/2007; 12/29/2007; 03/15/2008    left L4-5; fusion 5/09 surgery  . Anterior and posterior vaginal repair 12/31/2009    with TVT sling and cysto  . Dorsal compartment release 11/17/2011    Procedure: RELEASE DORSAL COMPARTMENT (DEQUERVAIN);  Surgeon: Wyn Forster., MD;  Location: [facility];  Service: Orthopedics;  Laterality: Right;  First dorsal compartment release    Family History  Problem Relation Age of Onset  . Adopted: Yes  . Colon cancer Neg Hx   . Diabetes Father   . Stroke Father 61  . Heart disease Mother     Allergies  Allergen Reactions  . Codeine Other (See Comments)    Chest pain  . Nitrofurantoin Other (See Comments)    Chest pain  . Percocet (Oxycodone-Acetaminophen) Nausea And Vomiting  . Propoxyphene And Methadone Nausea And Vomiting  . Vicodin (Hydrocodone-Acetaminophen) Nausea And Vomiting  . Clarithromycin Other (See Comments)    Abd. cramps  . Penicillins Rash  . Prednisone Other (See Comments)    UTI  . Sulfonamide Derivatives Other (See Comments)    GI upset    Current Outpatient Prescriptions on File  Prior to Visit  Medication Sig Dispense Refill  . ALPRAZolam (XANAX) 0.25 MG tablet Take 1 tablet (0.25 mg total) by mouth 3 (three) times daily as needed for sleep.  90 tablet  3  . atorvastatin (LIPITOR) 20 MG tablet Take 1 tablet (20 mg total) by mouth 2 (two) times a week. 1 tab on Monday and friday  10 tablet  6  . benazepril-hydrochlorthiazide (LOTENSIN HCT) 20-12.5 MG per tablet Take 1 tablet by mouth daily.  90 tablet  3  . Biotin 10 MG TABS Take by mouth daily.        . fexofenadine (ALLEGRA) 180 MG tablet Take 180 mg by mouth daily.        Marland Kitchen MOVIPREP 100 G SOLR Take 1 kit (100 g total) by mouth once.  1 kit  0  . multivitamin (THERAGRAN) per tablet Take 1 tablet by mouth  daily.        Marland Kitchen omeprazole (PRILOSEC) 40 MG capsule Take 1 capsule (40 mg total) by mouth daily.  30 capsule  11  . docusate sodium (COLACE) 100 MG capsule Take 100 mg by mouth 2 (two) times daily.       No current facility-administered medications on file prior to visit.    BP 130/70  Temp(Src) 97.5 F (36.4 C) (Oral)  Wt 149 lb (67.586 kg)chart    Objective:   Physical Exam  Constitutional: She is oriented to person, place, and time. She appears well-developed and well-nourished.  HENT:  Right Ear: External ear normal.  Left Ear: External ear normal.  Nose: Nose normal.  Mouth/Throat: Oropharynx is clear and moist.       Multiple dental caries noted to the posterior upper and lower left side. TMJ is negative  Neck: Neck supple.  Cardiovascular: Normal rate, regular rhythm and normal heart sounds.   Pulmonary/Chest: Effort normal and breath sounds normal.  Abdominal: Soft. Bowel sounds are normal.  Neurological: She is alert and oriented to person, place, and time.  Skin: Skin is warm and dry.  Psychiatric: She has a normal mood and affect.          Assessment & Plan:  Assessment: Left ear pain, Dental Caries  Plan: Patient has had this pain previously and been worked up by neuro and a Education officer, community. I really believe her symptoms are related to the dental caries and her symptoms mimic that of nerve involvement possibly requiring a root canal. I have advised her to contact a dentist. In the meantime, Tramadol QID prn that she has at home. Call the office if symptom worsen or persist.

## 2012-02-16 ENCOUNTER — Ambulatory Visit (AMBULATORY_SURGERY_CENTER): Payer: Medicare Other | Admitting: Gastroenterology

## 2012-02-16 ENCOUNTER — Encounter: Payer: Self-pay | Admitting: Gastroenterology

## 2012-02-16 VITALS — BP 139/82 | HR 77 | Temp 97.0°F | Resp 20 | Ht 64.0 in | Wt 147.0 lb

## 2012-02-16 DIAGNOSIS — Z1211 Encounter for screening for malignant neoplasm of colon: Secondary | ICD-10-CM

## 2012-02-16 HISTORY — PX: COLONOSCOPY: SHX174

## 2012-02-16 MED ORDER — SODIUM CHLORIDE 0.9 % IV SOLN: 500.0000 mL | INTRAVENOUS | Status: DC

## 2012-02-16 NOTE — Progress Notes (Signed)
Patient did not have preoperative order for IV antibiotic SSI prophylaxis. (G8918)  Patient did not experience any of the following events: a burn prior to discharge; a fall within the facility; wrong site/side/patient/procedure/implant event; or a hospital transfer or hospital admission upon discharge from the facility. (G8907)  

## 2012-02-16 NOTE — Op Note (Signed)
[facility] [address] [place], [place]  [address]  COLONOSCOPY PROCEDURE REPORT  PATIENT:  Bianca, Fry  MR#:  [id number] BIRTHDATE:  1946/06/21, 66 yrs. old  GENDER:  female ENDOSCOPIST:  Judie Petit T. Russella Dar, MD, [facility]  PROCEDURE DATE:  02/16/2012 PROCEDURE:  Colonoscopy 19147 ASA CLASS:  Class II INDICATIONS:  1) Routine Risk Screening MEDICATIONS:   MAC sedation, administered by CRNA, propofol (Diprivan) 100 mg IV DESCRIPTION OF PROCEDURE:   After the risks benefits and alternatives of the procedure were thoroughly explained, informed consent was obtained.  Digital rectal exam was performed and revealed no abnormalities.   The LB [id number] endoscope was introduced through the anus and advanced to the cecum, which was identified by both the appendix and ileocecal valve, without limitations.  The quality of the prep was excellent, using MoviPrep.  The instrument was then slowly withdrawn as the colon was fully examined. <<PROCEDUREIMAGES>> FINDINGS:  Mild diverticulosis was found in the sigmoid colon. Otherwise normal colonoscopy without other polyps, masses, vascular ectasias, or inflammatory changes. Retroflexed views in the rectum revealed no abnormalities. The time to cecum =  4.5  minutes. The scope was then withdrawn (time =  9.33  min) from the patient and the procedure completed.  COMPLICATIONS:  None  ENDOSCOPIC IMPRESSION: 1) Mild diverticulosis in the sigmoid colon  RECOMMENDATIONS: 1) High fiber diet with liberal fluid intake. 2) Continue current colorectal screening for "routine risk" patients with a repeat colonoscopy in 10 years.  Venita Lick. Russella Dar, MD, [facility]  n. eSIGNED:   Venita Lick. Bianca Fry at 02/16/2012 02:26 PM  [name], [phone]

## 2012-02-16 NOTE — Patient Instructions (Signed)

## 2012-02-17 ENCOUNTER — Telehealth: Payer: Self-pay | Admitting: *Deleted

## 2012-02-17 NOTE — Telephone Encounter (Signed)
  Follow up Call-  Call back number 02/16/2012  Post procedure Call Back phone  # [phone]  Permission to leave phone message Yes     Patient questions:  Do you have a fever, pain , or abdominal swelling? yes Pain Score  4 *  Have you tolerated food without any problems? yes  Have you been able to return to your normal activities? yes  Do you have any questions about your discharge instructions: Diet   no Medications  no Follow up visit  no  Do you have questions or concerns about your Care? no  Actions: * If pain score is 4 or above: No action needed, pain <4.  Pt. States she can feel her "stomach gurgling".  She has been passing gas since post procedure yesterday.  Encouraged to move around today.  Also advised to call if pain worsened or she still felt  Uncomfortable tomorrow.

## 2012-02-19 ENCOUNTER — Other Ambulatory Visit (INDEPENDENT_AMBULATORY_CARE_PROVIDER_SITE_OTHER): Payer: Medicare Other

## 2012-02-19 DIAGNOSIS — I10 Essential (primary) hypertension: Secondary | ICD-10-CM | POA: Diagnosis not present

## 2012-02-19 DIAGNOSIS — T887XXA Unspecified adverse effect of drug or medicament, initial encounter: Secondary | ICD-10-CM

## 2012-02-19 DIAGNOSIS — E785 Hyperlipidemia, unspecified: Secondary | ICD-10-CM | POA: Diagnosis not present

## 2012-02-19 LAB — CBC WITH DIFFERENTIAL/PLATELET
Eosinophils Relative: 2.8 % (ref 0.0–5.0)
HCT: 35 % — ABNORMAL LOW (ref 36.0–46.0)
Hemoglobin: 11.8 g/dL — ABNORMAL LOW (ref 12.0–15.0)
Lymphs Abs: 2.2 10*3/uL (ref 0.7–4.0)
MCV: 83.2 fl (ref 78.0–100.0)
Monocytes Absolute: 0.5 10*3/uL (ref 0.1–1.0)
Monocytes Relative: 7.1 % (ref 3.0–12.0)
Neutro Abs: 4.1 10*3/uL (ref 1.4–7.7)
RDW: 14.2 % (ref 11.5–14.6)
WBC: 7.1 10*3/uL (ref 4.5–10.5)

## 2012-02-19 LAB — HEPATIC FUNCTION PANEL
AST: 17 U/L (ref 0–37)
Alkaline Phosphatase: 86 U/L (ref 39–117)
Total Bilirubin: 0.6 mg/dL (ref 0.3–1.2)

## 2012-02-19 LAB — BASIC METABOLIC PANEL
BUN: 16 mg/dL (ref 6–23)
GFR: 76.01 mL/min (ref >60.00)
Potassium: 3.5 mEq/L (ref 3.5–5.1)
Sodium: 140 mEq/L (ref 135–145)

## 2012-02-19 LAB — LIPID PANEL
Cholesterol: 155 mg/dL (ref 0–200)
Total CHOL/HDL Ratio: 3

## 2012-02-29 ENCOUNTER — Telehealth: Payer: Self-pay | Admitting: Gastroenterology

## 2012-02-29 NOTE — Telephone Encounter (Signed)
Patient advised.

## 2012-02-29 NOTE — Telephone Encounter (Signed)
Patient with a history of ischemic colitis in Feb and was admitted to [facility].  She had a screening colonoscopy in Feb showed only diverticulosis.  She statets that yesterday she had lower abdominal cramping and bloody stools.  She reports less today than yesterday, she has only had 3 episodes today.  She reports that this is very similar to her symptoms from Feb but not as severe.  She reports bright red rectal bleeding with diarrhea, lower abdominal pain, and some mild nausea.  She denies fever.  I have given her an appt for tomorrow with Mike Gip PA at 9:00, advised a full liquid diet until I can call her back.  Dr Russella Dar are there any additional orders or recommendations?

## 2012-02-29 NOTE — Telephone Encounter (Signed)
No solid food until seen tomorrow Push clear fluids  Agree with other plans

## 2012-03-01 ENCOUNTER — Ambulatory Visit (AMBULATORY_SURGERY_CENTER): Payer: Medicare Other | Admitting: Gastroenterology

## 2012-03-01 ENCOUNTER — Encounter: Payer: Self-pay | Admitting: Physician Assistant

## 2012-03-01 ENCOUNTER — Ambulatory Visit (INDEPENDENT_AMBULATORY_CARE_PROVIDER_SITE_OTHER): Payer: Medicare Other | Admitting: Physician Assistant

## 2012-03-01 ENCOUNTER — Encounter: Payer: Self-pay | Admitting: Gastroenterology

## 2012-03-01 ENCOUNTER — Other Ambulatory Visit (INDEPENDENT_AMBULATORY_CARE_PROVIDER_SITE_OTHER): Payer: Medicare Other

## 2012-03-01 VITALS — BP 124/70 | HR 80 | Ht 64.0 in | Wt 143.2 lb

## 2012-03-01 VITALS — BP 122/55 | HR 73 | Temp 97.6°F | Resp 18 | Ht 64.0 in | Wt 147.0 lb

## 2012-03-01 DIAGNOSIS — K625 Hemorrhage of anus and rectum: Secondary | ICD-10-CM

## 2012-03-01 DIAGNOSIS — K559 Vascular disorder of intestine, unspecified: Secondary | ICD-10-CM

## 2012-03-01 DIAGNOSIS — K5289 Other specified noninfective gastroenteritis and colitis: Secondary | ICD-10-CM | POA: Diagnosis not present

## 2012-03-01 DIAGNOSIS — K5730 Diverticulosis of large intestine without perforation or abscess without bleeding: Secondary | ICD-10-CM

## 2012-03-01 DIAGNOSIS — K5790 Diverticulosis of intestine, part unspecified, without perforation or abscess without bleeding: Secondary | ICD-10-CM

## 2012-03-01 LAB — CBC WITH DIFFERENTIAL/PLATELET
Basophils Absolute: 0 10*3/uL (ref 0.0–0.1)
Eosinophils Absolute: 0.2 10*3/uL (ref 0.0–0.7)
HCT: 38.5 % (ref 36.0–46.0)
Hemoglobin: 12.6 g/dL (ref 12.0–15.0)
Lymphs Abs: 2 10*3/uL (ref 0.7–4.0)
MCHC: 32.8 g/dL (ref 30.0–36.0)
MCV: 84.3 fl (ref 78.0–100.0)
Monocytes Absolute: 0.9 10*3/uL (ref 0.1–1.0)
Neutro Abs: 9.9 10*3/uL — ABNORMAL HIGH (ref 1.4–7.7)
RDW: 14.1 % (ref 11.5–14.6)

## 2012-03-01 MED ORDER — SODIUM CHLORIDE 0.9 % IV SOLN: 500.0000 mL | INTRAVENOUS | Status: DC

## 2012-03-01 MED ORDER — DISPOSABLE ENEMA 19-7 GM/118ML RE ENEM: 1.0000 | ENEMA | Freq: Once | RECTAL | Status: DC

## 2012-03-01 NOTE — Patient Instructions (Signed)
YOU HAD AN ENDOSCOPIC PROCEDURE TODAY AT THE [facility]: Refer to the procedure report that was given to you for any specific questions about what was found during the examination.  If the procedure report does not answer your questions, please call your gastroenterologist to clarify.  If you requested that your care partner not be given the details of your procedure findings, then the procedure report has been included in a sealed envelope for you to review at your convenience later.  YOU SHOULD EXPECT: Some feelings of bloating in the abdomen. Passage of more gas than usual.  Walking can help get rid of the air that was put into your GI tract during the procedure and reduce the bloating. If you had a lower endoscopy (such as a colonoscopy or flexible sigmoidoscopy) you may notice spotting of blood in your stool or on the toilet paper. If you underwent a bowel prep for your procedure, then you may not have a normal bowel movement for a few days.  DIET: Your first meal following the procedure should be a light meal and then it is ok to progress to your normal diet.  A half-sandwich or bowl of soup is an example of a good first meal.  Heavy or fried foods are harder to digest and may make you feel nauseous or bloated.  Likewise meals heavy in dairy and vegetables can cause extra gas to form and this can also increase the bloating.  Drink plenty of fluids but you should avoid alcoholic beverages for 24 hours.  ACTIVITY: Your care partner should take you home directly after the procedure.  You should plan to take it easy, moving slowly for the rest of the day.  You can resume normal activity the day after the procedure however you should NOT DRIVE or use heavy machinery for 24 hours (because of the sedation medicines used during the test).    SYMPTOMS TO REPORT IMMEDIATELY: A gastroenterologist can be reached at any hour.  During normal business hours, 8:30 AM to 5:00 PM Monday through Friday,  call [phone].  After hours and on weekends, please call the GI answering service at [phone] who will take a message and have the physician on call contact you.   Following lower endoscopy (colonoscopy or flexible sigmoidoscopy):  Excessive amounts of blood in the stool  Significant tenderness or worsening of abdominal pains  Swelling of the abdomen that is new, acute  Fever of 100F or higher  Following upper endoscopy (EGD)  Vomiting of blood or coffee ground material  New chest pain or pain under the shoulder blades  Painful or persistently difficult swallowing  New shortness of breath  Fever of 100F or higher  Black, tarry-looking stools  FOLLOW UP: If any biopsies were taken you will be contacted by phone or by letter within the next 1-3 weeks.  Call your gastroenterologist if you have not heard about the biopsies in 3 weeks.  Our staff will call the home number listed on your records the next business day following your procedure to check on you and address any questions or concerns that you may have at that time regarding the information given to you following your procedure. This is a courtesy call and so if there is no answer at the home number and we have not heard from you through the emergency physician on call, we will assume that you have returned to your regular daily activities without incident.  SIGNATURES/CONFIDENTIALITY: You and/or your care   partner have signed paperwork which will be entered into your electronic medical record.  These signatures attest to the fact that that the information above on your After Visit Summary has been reviewed and is understood.  Full responsibility of the confidentiality of this discharge information lies with you and/or your care-partner.  

## 2012-03-01 NOTE — Progress Notes (Signed)
Patient did not experience any of the following events: a burn prior to discharge; a fall within the facility; wrong site/side/patient/procedure/implant event; or a hospital transfer or hospital admission upon discharge from the facility. (G8907) Patient did not have preoperative order for IV antibiotic SSI prophylaxis. (G8918)  

## 2012-03-01 NOTE — Progress Notes (Addendum)
Pt is to have 2 fleets enema per Dr. Ardell Isaacs orders.  2 enemas given per writer and pt is able to hold both doses in her rectum approximately 10 minutes.  Pt returns large amt of blood clots into toilet.

## 2012-03-01 NOTE — Progress Notes (Signed)
Subjective:    Patient ID: Bianca Fry, female    DOB: Oct 22, 1945, 66 y.o.   MRN: [id number]  HPI Bianca Fry is a pleasant 66 year old white female known to Dr. Russella Dar. [facility] after she had presented there with an episode of acute abdominal pain nausea vomiting and rectal bleeding. She underwent CT scan of the abdomen and pelvis on 11/19/2011 which was positive for bowel wall thickening in the descending colon and mild diverticulosis. 6 days later she underwent a flexible sigmoidoscopy with Dr. Markham Jordan on 11/24/2011 which showed internal hemorrhoids but otherwise was felt to be a negative exam. Clinically it is felt that she had an ischemic colitis. She was treated empirically at that time with Cipro and Flagyl. She had felt fine since that time had come here for evaluation with Dr. Russella Dar and underwent a full colonoscopy on 02/16/2012 which showed mild sigmoid diverticulosis and otherwise was a normal exam.  Patient comes in today stating that she has had another episode very similar to the ones she had in February though not quite as severe. She said she was feeling fine and then had abrupt onset of left lower quadrant abdominal pain on Sunday 5/12 followed by diaphoresis nausea and then 4-5 bowel movements but she says it started out normal-appearing and then became bloody. She says she has not felt well since then. Yesterday she continued to have crampy lower abdominal pain and passage of smaller amounts of bright red blood and small clots. She's not had any fever or chills. She has been able to keep down some by mouth dose. This morning she has had 3 bowel movements all of which some dark blood and small clots. On reviewing her history she does not have any history of vascular disease, she is a nonsmoker, she is not on any hormone therapy. She does take blood pressure medication and occasionally takes anti-inflammatories. She has not started  any new medications or had change in dosages.    Review of Systems  Constitutional: Positive for appetite change and fatigue.  HENT: Negative.   Eyes: Negative.   Respiratory: Negative.   Cardiovascular: Negative.   Gastrointestinal: Positive for nausea, abdominal pain and blood in stool.  Genitourinary: Negative.   Musculoskeletal: Negative.   Neurological: Negative.   Hematological: Negative.   Psychiatric/Behavioral: Negative.    Outpatient Prescriptions Prior to Visit  Medication Sig Dispense Refill  . acetaminophen (TYLENOL) 650 MG CR tablet Take 650 mg by mouth every 8 (eight) hours as needed.      . ALPRAZolam (XANAX) 0.25 MG tablet Take 1 tablet (0.25 mg total) by mouth 3 (three) times daily as needed for sleep.  90 tablet  3  . atorvastatin (LIPITOR) 20 MG tablet Take 1 tablet (20 mg total) by mouth 2 (two) times a week. 1 tab on Monday and friday  10 tablet  6  . benazepril-hydrochlorthiazide (LOTENSIN HCT) 20-12.5 MG per tablet Take 1 tablet by mouth daily.  90 tablet  3  . Biotin 10 MG TABS Take by mouth daily.        . fexofenadine (ALLEGRA) 180 MG tablet Take 180 mg by mouth daily.        . multivitamin (THERAGRAN) per tablet Take 1 tablet by mouth daily.        Marland Kitchen omeprazole (PRILOSEC) 40 MG capsule Take 1 capsule (40 mg total) by mouth daily.  30 capsule  11  . docusate sodium (COLACE) 100  MG capsule Take 100 mg by mouth 2 (two) times daily.       Allergies  Allergen Reactions  . Codeine Other (See Comments)    Chest pain  . Nitrofurantoin Other (See Comments)    Chest pain  . Percocet (Oxycodone-Acetaminophen) Nausea And Vomiting  . Propoxyphene And Methadone Nausea And Vomiting  . Vicodin (Hydrocodone-Acetaminophen) Nausea And Vomiting  . Clarithromycin Other (See Comments)    Abd. cramps  . Penicillins Rash  . Prednisone Other (See Comments)    UTI  . Sulfonamide Derivatives Other (See Comments)    GI upset   Patient Active Problem List  Diagnoses  .  HYPERLIPIDEMIA  . OTHER SPECIFIED IRON DEFICIENCY ANEMIAS  . ADJUSTMENT DISORDER WITH DEPRESSED MOOD  . HYPERTENSION  . ALLERGIC RHINITIS  . GERD  . BACK PAIN, LUMBAR, WITH RADICULOPATHY  . COSTOCHONDRITIS  . Ischemic colitis       Objective:   Physical Exam well-developed white female in no acute distress, blood pressure 124/70 pulse 80. HEENT; nontraumatic, normocephalic, EOMI PERRLA sclera anicteric, Neck; supple no JVD, Cardiovascular; regular rate and rhythm with S1-S2 no murmur rub or gallop, Pulmonary ;clear bilaterally, Abdomen; soft bowel sounds are active she is tender in the left mid quadrant left lower quadrant no guarding no rebound no palpable mass or hepatosplenomegaly bowel sounds are active, Rectal exam; dark maroonish stool on examining glove, ext; no clubbing cyanosis or edema skin warm and dry, Psych; mood and affect normal and appropriate.        Assessment & Plan:  #49 66-year-old female with a recurrent acute episode of lower abdominal pain diaphoresis and rectal bleeding onset 02/28/2012. Patient had a very similar episode in February of 2013 for which she was hospitalized at [facility]. CT scan was consistent with a left-sided colitis however sigmoidoscopy did not show any inflammatory changes a few days later. Clinically her symptoms are consistent with a segmental ischemic colitis, other possibility would be a segmental diverticulitis/colitis picture. Plan; CBC today Schedule for flexible sigmoidoscopy today if possible, to evaluate during the acute episode and biopsy. Further plans pending findings at sigmoidoscopy. Plans were discussed in detail with the patient procedure we schedule with Dr. Russella Dar and patient is agreeable to proceed

## 2012-03-01 NOTE — Progress Notes (Signed)
i agree with the plan outlined in this note 

## 2012-03-01 NOTE — Op Note (Signed)
[facility] [address] [place], [place]  [address]  FLEXIBLE SIGMOIDOSCOPY PROCEDURE REPORT  PATIENT:  Bianca, Fry  MR#:  [id number] BIRTHDATE:  [birth year], 66 yrs. old  GENDER:  female ENDOSCOPIST:  Judie Petit T. Russella Dar, MD, [facility]  PROCEDURE DATE:  03/01/2012 PROCEDURE:  Flexible Sigmoidoscopy with biopsy ASA CLASS:  Class II INDICATIONS:  hematochezia, diarrhea, abdominal pain -LLQ MEDICATIONS:   These medications were titrated to patient response per physician's verbal order, Fentanyl 50 mcg IV, Versed 6 mg IV DESCRIPTION OF PROCEDURE:   After the risks benefits and alternatives of the procedure were thoroughly explained, informed consent was obtained.  Digital rectal exam was performed and revealed no abnormalities.   The [id number] endoscope was introduced through the anus and advanced to the splenic flexure, without limitations.  The quality of the prep was good. The instrument was then slowly withdrawn as the mucosa was fully examined. <<PROCEDUREIMAGES>> Segmental colitis was found in the descending colon. It was friable, erythematous and erosive. Multiple biopsies were obtained and sent to pathology.  Mild diverticulosis was found in the sigmoid colon.  The examination was otherwise normal. Retroflexed views in the rectum revealed no abnormalities.    The scope was then withdrawn from the patient and the procedure terminated.  COMPLICATIONS:  None  ENDOSCOPIC IMPRESSION: 1) Segmental colitis in the descending colon c/w ischemia 2) Mild diverticulosis in the sigmoid colon  RECOMMENDATIONS: 1) await biopsy results 2) Call the office to schedule a followup office visit for 4 weeks 3) abdominal/pelvic CTA  Judie Petit T. Russella Dar, MD, [facility]  n. eSIGNED:   [name] at 03/01/2012 12:12 PM  [name], [phone]

## 2012-03-02 ENCOUNTER — Ambulatory Visit (INDEPENDENT_AMBULATORY_CARE_PROVIDER_SITE_OTHER): Payer: Medicare Other | Admitting: Internal Medicine

## 2012-03-02 ENCOUNTER — Other Ambulatory Visit: Payer: Self-pay

## 2012-03-02 ENCOUNTER — Telehealth: Payer: Self-pay

## 2012-03-02 ENCOUNTER — Telehealth: Payer: Self-pay | Admitting: *Deleted

## 2012-03-02 ENCOUNTER — Encounter: Payer: Self-pay | Admitting: Internal Medicine

## 2012-03-02 VITALS — BP 140/80 | HR 76 | Temp 98.2°F | Resp 16 | Ht 64.0 in | Wt 143.0 lb

## 2012-03-02 DIAGNOSIS — H6120 Impacted cerumen, unspecified ear: Secondary | ICD-10-CM

## 2012-03-02 DIAGNOSIS — D72829 Elevated white blood cell count, unspecified: Secondary | ICD-10-CM | POA: Diagnosis not present

## 2012-03-02 DIAGNOSIS — R1032 Left lower quadrant pain: Secondary | ICD-10-CM | POA: Diagnosis not present

## 2012-03-02 DIAGNOSIS — K559 Vascular disorder of intestine, unspecified: Secondary | ICD-10-CM

## 2012-03-02 DIAGNOSIS — Z0000 Encounter for general adult medical examination without abnormal findings: Secondary | ICD-10-CM

## 2012-03-02 MED ORDER — CIPROFLOXACIN HCL 500 MG PO TABS: 500.0000 mg | ORAL_TABLET | Freq: Two times a day (BID) | ORAL | Status: AC

## 2012-03-02 MED ORDER — METRONIDAZOLE 500 MG PO TABS: 500.0000 mg | ORAL_TABLET | Freq: Three times a day (TID) | ORAL | Status: AC

## 2012-03-02 NOTE — Telephone Encounter (Signed)
  Follow up Call-  Call back number 03/01/2012 02/16/2012  Post procedure Call Back phone  # [phone]  Permission to leave phone message Yes Yes     Patient questions:  Do you have a fever, pain , or abdominal swelling? no Pain Score  0 *  Have you tolerated food without any problems? yes  Have you been able to return to your normal activities? yes  Do you have any questions about your discharge instructions: Diet   no Medications  no Follow up visit  no  Do you have questions or concerns about your Care? no  Actions: * If pain score is 4 or above: No action needed, pain <4.

## 2012-03-02 NOTE — Telephone Encounter (Signed)
Per flex sig report from yesterday the patient needs a CTA of abdomen and pelvis.  I have scheduled her an appt for 03/08/12 10:00.  No oral contrast, but the patient is to be NPO. Patient advised of appt date and time

## 2012-03-02 NOTE — Patient Instructions (Signed)
Complete 7 days of the Cipro and Flagyl and notify my office if the symptoms do not improve

## 2012-03-02 NOTE — Progress Notes (Signed)
Subjective:    Patient ID: Bianca Fry, female    DOB: [birth year], 66 y.o.   MRN: [id number]  HPI Patient presents today for a Medicare wellness examination and an acute visit for left lower quadrant abdominal pain she has seen gastroenterology and had a flexible sigmoidoscopy that showed colitis in her left lower colon with some air erythematous and friable tissue.  Of note is that her white cell count is elevated with a neutrophil predominance or left shift.  She denies any fever but has felt ill and has had persistent discomfort in her left lower quadrant.  She also complains of a full feeling in her left ear   Review of Systems  Constitutional: Negative for activity change, appetite change and fatigue.  HENT: Positive for ear pain. Negative for congestion, neck pain, postnasal drip and sinus pressure.   Eyes: Negative for redness and visual disturbance.  Respiratory: Positive for shortness of breath. Negative for cough and wheezing.   Gastrointestinal: Positive for abdominal pain and abdominal distention.  Genitourinary: Negative for dysuria, frequency and menstrual problem.  Musculoskeletal: Negative for myalgias, joint swelling and arthralgias.  Skin: Negative for rash and wound.  Neurological: Negative for dizziness, weakness and headaches.  Hematological: Negative for adenopathy. Does not bruise/bleed easily.  Psychiatric/Behavioral: Negative for sleep disturbance and decreased concentration.   Past Medical History  Diagnosis Date  . GERD (gastroesophageal reflux disease)   . Allergy   . Hyperlipidemia   . Arthritis     neck and shoulders, right fingers  . DeQuervain's disease (tenosynovitis) 10/2011    right wrist  . Hypertension     under control; has been on med. since 2009  . Erosive gastritis   . Hiatal hernia   . Internal hemorrhoids   . Colitis   . Anxiety   . Cataract     History   Social History  . Marital Status: Married    Spouse Name: N/A   Number of Children: 1  . Years of Education: N/A   Occupational History  . Retired    Social History Main Topics  . Smoking status: Never Smoker   . Smokeless tobacco: Never Used  . Alcohol Use: No  . Drug Use: No  . Sexually Active: Yes   Other Topics Concern  . Not on file   Social History Narrative   Daily caffeine     Past Surgical History  Procedure Date  . Laparoscopic lysis intestinal adhesions 1999  . Cholecystectomy 1970  . Appendectomy 1970    at same time as gallbladder  . Abdominal hysterectomy 1985    partial  . Tonsillectomy 1984  . Cataract extraction 2009    right with lens implant  . Lumbar laminectomy/decompression microdiscectomy 11/29/2007; 12/29/2007; 03/15/2008    left L4-5; fusion 5/09 surgery  . Anterior and posterior vaginal repair 12/31/2009    with TVT sling and cysto  . Dorsal compartment release 11/17/2011    Procedure: RELEASE DORSAL COMPARTMENT (DEQUERVAIN);  Surgeon: Wyn Forster., MD;  Location: [facility];  Service: Orthopedics;  Laterality: Right;  First dorsal compartment release  . Colonoscopy 02/16/12    Dr. Claudette Head    Family History  Problem Relation Age of Onset  . Adopted: Yes  . Colon cancer Neg Hx   . Diabetes Father   . Stroke Father 6  . Heart disease Mother     Allergies  Allergen Reactions  . Codeine Other (See Comments)    Chest  pain  . Nitrofurantoin Other (See Comments)    Chest pain  . Percocet (Oxycodone-Acetaminophen) Nausea And Vomiting  . Propoxyphene And Methadone Nausea And Vomiting  . Vicodin (Hydrocodone-Acetaminophen) Nausea And Vomiting  . Clarithromycin Other (See Comments)    Abd. cramps  . Penicillins Rash  . Prednisone Other (See Comments)    UTI  . Sulfonamide Derivatives Other (See Comments)    GI upset    Current Outpatient Prescriptions on File Prior to Visit  Medication Sig Dispense Refill  . acetaminophen (TYLENOL) 650 MG CR tablet Take 650 mg by mouth  every 8 (eight) hours as needed.      . ALPRAZolam (XANAX) 0.25 MG tablet Take 1 tablet (0.25 mg total) by mouth 3 (three) times daily as needed for sleep.  90 tablet  3  . atorvastatin (LIPITOR) 20 MG tablet Take 1 tablet (20 mg total) by mouth 2 (two) times a week. 1 tab on Monday and friday  10 tablet  6  . benazepril-hydrochlorthiazide (LOTENSIN HCT) 20-12.5 MG per tablet Take 1 tablet by mouth daily.  90 tablet  3  . Biotin 10 MG TABS Take by mouth daily.        . fexofenadine (ALLEGRA) 180 MG tablet Take 180 mg by mouth daily.        . multivitamin (THERAGRAN) per tablet Take 1 tablet by mouth daily.        . Naproxen Sodium (ALEVE PO) Take by mouth as needed.      Marland Kitchen omeprazole (PRILOSEC) 40 MG capsule Take 1 capsule (40 mg total) by mouth daily.  30 capsule  11   Current Facility-Administered Medications on File Prior to Visit  Medication Dose Route Frequency Provider Last Rate Last Dose  . DISCONTD: 0.9 %  sodium chloride infusion  500 mL Intravenous Continuous Meryl Dare, MD,FACG        BP 140/80  Pulse 76  Temp 98.2 F (36.8 C)  Resp 16  Ht 5\' 4"  (1.626 m)  Wt 143 lb (64.864 kg)  BMI 24.55 kg/m2       Objective:   Physical Exam  Nursing note and vitals reviewed. Constitutional: She is oriented to person, place, and time. She appears well-developed and well-nourished. No distress.  HENT:  Head: Normocephalic and atraumatic.  Right Ear: External ear normal.  Left Ear: External ear normal.  Nose: Nose normal.  Mouth/Throat: Oropharynx is clear and moist.  Eyes: Conjunctivae and EOM are normal. Pupils are equal, round, and reactive to light.  Neck: Normal range of motion. Neck supple. No JVD present. No tracheal deviation present. No thyromegaly present.  Cardiovascular: Normal rate, regular rhythm, normal heart sounds and intact distal pulses.   No murmur heard. Pulmonary/Chest: Effort normal and breath sounds normal. She has no wheezes. She exhibits no  tenderness.  Abdominal: Bowel sounds are normal. She exhibits distension. There is tenderness.  Musculoskeletal: Normal range of motion. She exhibits no edema and no tenderness.  Lymphadenopathy:    She has no cervical adenopathy.  Neurological: She is alert and oriented to person, place, and time. She has normal reflexes. No cranial nerve deficit.  Skin: Skin is warm and dry. She is not diaphoretic.  Psychiatric: She has a normal mood and affect. Her behavior is normal.   Breasts examination was normal      Assessment & Plan:  Patient has left lower quadrant pain moderate leukocytosis with a left shift and a history of diverticulitis I think it is prudent  to use a short course of Cipro and Flagyl to her symptoms abate.  If the symptoms do not change dramatically with antibiotics will contact our office.  She is intolerant of prednisone would not use prednisone in case of colitis.  Patient has wax impaction in her left ear and we will lavage to clear  Informed consent was obtained and peroxide gel was inserted into the ears bilaterally using the lavage kit the ears were lavaged until clean.Inspection with a cerumen spoon removed residual wax. Patient tolerated the procedure well.  Subjective:    Bianca Fry is a 66 y.o. female who presents for Medicare Annual/Subsequent preventive examination.  Preventive Screening-Counseling & Management  Tobacco History  Smoking status  . Never Smoker   Smokeless tobacco  . Never Used     Problems Prior to Visit 1.   Current Problems (verified) Patient Active Problem List  Diagnoses  . HYPERLIPIDEMIA  . OTHER SPECIFIED IRON DEFICIENCY ANEMIAS  . ADJUSTMENT DISORDER WITH DEPRESSED MOOD  . HYPERTENSION  . ALLERGIC RHINITIS  . GERD  . BACK PAIN, LUMBAR, WITH RADICULOPATHY  . COSTOCHONDRITIS  . Ischemic colitis    Medications Prior to Visit Current Outpatient Prescriptions on File Prior to Visit  Medication Sig Dispense Refill    . acetaminophen (TYLENOL) 650 MG CR tablet Take 650 mg by mouth every 8 (eight) hours as needed.      . ALPRAZolam (XANAX) 0.25 MG tablet Take 1 tablet (0.25 mg total) by mouth 3 (three) times daily as needed for sleep.  90 tablet  3  . atorvastatin (LIPITOR) 20 MG tablet Take 1 tablet (20 mg total) by mouth 2 (two) times a week. 1 tab on Monday and friday  10 tablet  6  . benazepril-hydrochlorthiazide (LOTENSIN HCT) 20-12.5 MG per tablet Take 1 tablet by mouth daily.  90 tablet  3  . Biotin 10 MG TABS Take by mouth daily.        . fexofenadine (ALLEGRA) 180 MG tablet Take 180 mg by mouth daily.        . multivitamin (THERAGRAN) per tablet Take 1 tablet by mouth daily.        . Naproxen Sodium (ALEVE PO) Take by mouth as needed.      Marland Kitchen omeprazole (PRILOSEC) 40 MG capsule Take 1 capsule (40 mg total) by mouth daily.  30 capsule  11   Current Facility-Administered Medications on File Prior to Visit  Medication Dose Route Frequency Provider Last Rate Last Dose  . DISCONTD: 0.9 %  sodium chloride infusion  500 mL Intravenous Continuous Meryl Dare, MD,FACG        Current Medications (verified) Current Outpatient Prescriptions  Medication Sig Dispense Refill  . acetaminophen (TYLENOL) 650 MG CR tablet Take 650 mg by mouth every 8 (eight) hours as needed.      . ALPRAZolam (XANAX) 0.25 MG tablet Take 1 tablet (0.25 mg total) by mouth 3 (three) times daily as needed for sleep.  90 tablet  3  . atorvastatin (LIPITOR) 20 MG tablet Take 1 tablet (20 mg total) by mouth 2 (two) times a week. 1 tab on Monday and friday  10 tablet  6  . benazepril-hydrochlorthiazide (LOTENSIN HCT) 20-12.5 MG per tablet Take 1 tablet by mouth daily.  90 tablet  3  . Biotin 10 MG TABS Take by mouth daily.        . fexofenadine (ALLEGRA) 180 MG tablet Take 180 mg by mouth daily.        Marland Kitchen  multivitamin (THERAGRAN) per tablet Take 1 tablet by mouth daily.        . Naproxen Sodium (ALEVE PO) Take by mouth as needed.       Marland Kitchen omeprazole (PRILOSEC) 40 MG capsule Take 1 capsule (40 mg total) by mouth daily.  30 capsule  11   Current Facility-Administered Medications  Medication Dose Route Frequency Provider Last Rate Last Dose  . DISCONTD: 0.9 %  sodium chloride infusion  500 mL Intravenous Continuous Meryl Dare, MD,FACG         Allergies (verified) Codeine; Nitrofurantoin; Percocet; Propoxyphene and methadone; Vicodin; Clarithromycin; Penicillins; Prednisone; and Sulfonamide derivatives   PAST HISTORY  Family History Family History  Problem Relation Age of Onset  . Adopted: Yes  . Colon cancer Neg Hx   . Diabetes Father   . Stroke Father 37  . Heart disease Mother     Social History History  Substance Use Topics  . Smoking status: Never Smoker   . Smokeless tobacco: Never Used  . Alcohol Use: No     Are there smokers in your home (other than you)? No  Risk Factors Current exercise habits: The patient does not participate in regular exercise at present.  Dietary issues discussed: GI concerns   Cardiac risk factors: sedentary lifestyle.  Depression Screen (Note: if answer to either of the following is "Yes", a more complete depression screening is indicated)   Over the past two weeks, have you felt down, depressed or hopeless? No  Over the past two weeks, have you felt little interest or pleasure in doing things? No  Have you lost interest or pleasure in daily life? No  Do you often feel hopeless? No  Do you cry easily over simple problems? No  Activities of Daily Living In your present state of health, do you have any difficulty performing the following activities?:  Driving? No Managing money?  No Feeding yourself? No Getting from bed to chair? No Climbing a flight of stairs? No Preparing food and eating?: No Bathing or showering? No Getting dressed: No Getting to the toilet? No Using the toilet:No Moving around from place to place: No In the past year have you fallen or  had a near fall?:No   Are you sexually active?  Yes  Do you have more than one partner?  No  Hearing Difficulties: No Do you often ask people to speak up or repeat themselves? No Do you experience ringing or noises in your ears? No Do you have difficulty understanding soft or whispered voices? No   Do you feel that you have a problem with memory? No  Do you often misplace items? No  Do you feel safe at home?  No  Cognitive Testing  Alert? Yes  Normal Appearance?Yes  Oriented to person? Yes  Place? Yes   Time? Yes  Recall of three objects?  Yes  Can perform simple calculations? Yes  Displays appropriate judgment?Yes  Can read the correct time from a watch face?Yes   Advanced Directives have been discussed with the patient? Yes  List the Names of Other Physician/Practitioners you currently use: 1.    Indicate any recent Medical Services you may have received from other than [facility] providers in the past year (date may be approximate).  Immunization History  Administered Date(s) Administered  . Influenza Split 07/02/2011  . Influenza Whole 07/09/2008, 08/04/2010  . Pneumococcal Polysaccharide 12/03/2008  . Td 10/19/2002  . Tdap 04/09/2011  . Zoster 08/04/2010    Screening  Tests Health Maintenance  Topic Date Due  . Pneumococcal Polysaccharide Vaccine Age 65 And Over  12/20/2009  . Mammogram  04/18/2011  . Influenza Vaccine  07/19/2012  . Tetanus/tdap  04/08/2021  . Colonoscopy  02/15/2022  . Zostavax  Completed    All answers were reviewed with the patient and necessary referrals were made:  [name], MD   03/02/2012   History reviewed: allergies, current medications, past family history, past medical history, past social history, past surgical history and problem list  Review of Systems A comprehensive review of systems was negative except for: Ears, nose, mouth, throat, and face: positive for ear drainage and earaches    Objective:     Vision by Snellen  chart: right eye:20/20, left eye:20/20  Body mass index is 24.55 kg/(m^2). BP 140/80  Pulse 76  Temp 98.2 F (36.8 C)  Resp 16  Ht 5\' 4"  (1.626 m)  Wt 143 lb (64.864 kg)  BMI 24.55 kg/m2  BP 140/80  Pulse 76  Temp 98.2 F (36.8 C)  Resp 16  Ht 5\' 4"  (1.626 m)  Wt 143 lb (64.864 kg)  BMI 24.55 kg/m2  General Appearance:    Alert, cooperative, no distress, appears stated age  Head:    Normocephalic, without obvious abnormality, atraumatic  Eyes:    PERRL, conjunctiva/corneas clear, EOM's intact, fundi    benign, both eyes  Ears:    Normal TM's and external ear canals, both ears  Nose:   Nares normal, septum midline, mucosa normal, no drainage    or sinus tenderness  Throat:   Lips, mucosa, and tongue normal; teeth and gums normal  Neck:   Supple, symmetrical, trachea midline, no adenopathy;    thyroid:  no enlargement/tenderness/nodules; no carotid   bruit or JVD  Back:     Symmetric, no curvature, ROM normal, no CVA tenderness  Lungs:     Clear to auscultation bilaterally, respirations unlabored  Chest Wall:    No tenderness or deformity   Heart:    Regular rate and rhythm, S1 and S2 normal, no murmur, rub   or gallop  Breast Exam:    No tenderness, masses, or nipple abnormality  Abdomen:     Soft, non-tender, bowel sounds active all four quadrants,    no masses, no organomegaly  Genitalia:    Normal female without lesion, discharge or tenderness  Rectal:    Normal tone, normal prostate, no masses or tenderness;   guaiac negative stool  Extremities:   Extremities normal, atraumatic, no cyanosis or edema  Pulses:   2+ and symmetric all extremities  Skin:   Skin color, texture, turgor normal, no rashes or lesions  Lymph nodes:   Cervical, supraclavicular, and axillary nodes normal  Neurologic:   CNII-XII intact, normal strength, sensation and reflexes    throughout       Assessment:      This is a routine physical examination for this healthy  Female. Reviewed all  health maintenance protocols including mammography colonoscopy bone density and reviewed appropriate screening labs. Her immunization history was reviewed as well as her current medications and allergies refills of her chronic medications were given and the plan for yearly health maintenance was discussed all orders and referrals were made as appropriate.      Plan:     During the course of the visit the patient was educated and counseled about appropriate screening and preventive services including:    Pneumococcal vaccine   Influenza vaccine  Screening mammography x Diet review for nutrition referral? Yes ____  Not Indicated __x__   Patient Instructions (the written plan) was given to the patient.  Medicare Attestation I have personally reviewed: The patient's medical and social history Their use of alcohol, tobacco or illicit drugs Their current medications and supplements The patient's functional ability including ADLs,fall risks, home safety risks, cognitive, and hearing and visual impairment Diet and physical activities Evidence for depression or mood disorders  The patient's weight, height, BMI, and visual acuity have been recorded in the chart.  I have made referrals, counseling, and provided education to the patient based on review of the above and I have provided the patient with a written personalized care plan for preventive services.     [name], MD   03/02/2012

## 2012-03-07 ENCOUNTER — Encounter: Payer: Self-pay | Admitting: Gastroenterology

## 2012-03-08 ENCOUNTER — Ambulatory Visit (INDEPENDENT_AMBULATORY_CARE_PROVIDER_SITE_OTHER)
Admission: RE | Admit: 2012-03-08 | Discharge: 2012-03-08 | Disposition: A | Discharge status: Home / Self Care | Payer: Medicare Other | Source: Ambulatory Visit | Attending: Gastroenterology | Admitting: Gastroenterology

## 2012-03-08 DIAGNOSIS — R1032 Left lower quadrant pain: Secondary | ICD-10-CM | POA: Diagnosis not present

## 2012-03-08 DIAGNOSIS — K559 Vascular disorder of intestine, unspecified: Secondary | ICD-10-CM

## 2012-03-08 DIAGNOSIS — K7689 Other specified diseases of liver: Secondary | ICD-10-CM | POA: Diagnosis not present

## 2012-03-08 DIAGNOSIS — K921 Melena: Secondary | ICD-10-CM | POA: Diagnosis not present

## 2012-03-08 MED ORDER — IOHEXOL 350 MG/ML SOLN
100.0000 mL | Freq: Once | INTRAVENOUS | Status: AC | PRN
  Administered 2012-03-08: 100 mL via INTRAVENOUS

## 2012-03-10 ENCOUNTER — Other Ambulatory Visit: Payer: Self-pay | Admitting: Dermatology

## 2012-03-10 DIAGNOSIS — L570 Actinic keratosis: Secondary | ICD-10-CM | POA: Diagnosis not present

## 2012-03-10 DIAGNOSIS — L820 Inflamed seborrheic keratosis: Secondary | ICD-10-CM | POA: Diagnosis not present

## 2012-03-10 DIAGNOSIS — L851 Acquired keratosis [keratoderma] palmaris et plantaris: Secondary | ICD-10-CM | POA: Diagnosis not present

## 2012-03-10 DIAGNOSIS — D485 Neoplasm of uncertain behavior of skin: Secondary | ICD-10-CM | POA: Diagnosis not present

## 2012-03-10 DIAGNOSIS — L578 Other skin changes due to chronic exposure to nonionizing radiation: Secondary | ICD-10-CM | POA: Diagnosis not present

## 2012-03-11 ENCOUNTER — Other Ambulatory Visit: Payer: Medicare Other | Admitting: Gastroenterology

## 2012-04-01 ENCOUNTER — Other Ambulatory Visit: Payer: Self-pay | Admitting: Internal Medicine

## 2012-04-04 ENCOUNTER — Encounter: Payer: Self-pay | Admitting: Gastroenterology

## 2012-04-04 ENCOUNTER — Ambulatory Visit (INDEPENDENT_AMBULATORY_CARE_PROVIDER_SITE_OTHER): Payer: Medicare Other | Admitting: Gastroenterology

## 2012-04-04 VITALS — BP 128/64 | HR 68 | Ht 64.0 in | Wt 143.0 lb

## 2012-04-04 DIAGNOSIS — K559 Vascular disorder of intestine, unspecified: Secondary | ICD-10-CM | POA: Diagnosis not present

## 2012-04-04 DIAGNOSIS — K589 Irritable bowel syndrome without diarrhea: Secondary | ICD-10-CM

## 2012-04-04 MED ORDER — HYOSCYAMINE SULFATE 0.125 MG SL SUBL: 0.1250 mg | SUBLINGUAL_TABLET | SUBLINGUAL | Status: DC | PRN

## 2012-04-04 NOTE — Patient Instructions (Addendum)
We have sent the following medications to your pharmacy for you to pick up at your convenience:Levsin. cc: Darryll Capers, MD

## 2012-04-04 NOTE — Progress Notes (Signed)
History of Present Illness: This is a 66 year old female here today with her husband. She was diagnosed with ischemic colitis. CT angiogram as below did not reveal any significant vascular obstruction although vascular disease was noted. She states she had one mild attack since I last saw her with mild left lower quadrant pain and a small amount of bleeding that resolved within a few hours. She has a history of irritable bowel syndrome with alternating diarrhea and constipation and abdominal cramping.  She recently has days with significant diarrhea.  Celiac axis: Partially calcified ostial plaque. Mild  narrowing beyond the ostium at the level of the median arcuate  Ligament of the diaphragm. Widely patent distally. There is a  fusiform 9 mm splenic artery aneurysm at its bifurcation.  Superior mesenteric:Minimal ostial nonocclusive plaque, widely  patent with classic distal branching anatomy. Inferior mesenteric:Widely patent Scattered aortic plaque with no significant proximal mesenteric arterial occlusive disease. 12 mm hepatic lesion with imaging characteristics suggesting hemangioma in the absence of a history of primary carcinoma.  Current Medications, Allergies, Past Medical History, Past Surgical History, Family History and Social History were reviewed in [employer] record.  Physical Exam: General: Well developed , well nourished, no acute distress Head: Normocephalic and atraumatic Eyes:  sclerae anicteric, EOMI Ears: Normal auditory acuity Mouth: No deformity or lesions Lungs: Clear throughout to auscultation Heart: Regular rate and rhythm; no murmurs, rubs or bruits Abdomen: Soft, mild diffuse tenderness without rebound or guarding and non distended. No masses, hepatosplenomegaly or hernias noted. Normal Bowel sounds Musculoskeletal: Symmetrical with no gross deformities  Pulses:  Normal pulses noted Extremities: No clubbing, cyanosis, edema or  deformities noted Neurological: Alert oriented x 4, grossly nonfocal Psychological:  Alert and cooperative. Normal mood and affect  Assessment and Recommendations:  1. ischemic colitis. Maintain adequate hydration. Continue a daily aspirin. Discontinue NSAID usage. Return office visit in 6 months.  2. Irritable bowel syndrome. Increase dietary fiber and water intake. Hyoscyamine 1-2 every 4 hours as needed for abdominal pain, abdominal cramping, bloating and diarrhea.  3. Hepatic hemangioma. No further evaluation needed.

## 2012-04-12 DIAGNOSIS — H02409 Unspecified ptosis of unspecified eyelid: Secondary | ICD-10-CM | POA: Diagnosis not present

## 2012-04-12 DIAGNOSIS — H5340 Unspecified visual field defects: Secondary | ICD-10-CM | POA: Diagnosis not present

## 2012-05-09 ENCOUNTER — Ambulatory Visit: Payer: Self-pay | Admitting: Otolaryngology

## 2012-05-09 DIAGNOSIS — Z01812 Encounter for preprocedural laboratory examination: Secondary | ICD-10-CM | POA: Diagnosis not present

## 2012-05-09 DIAGNOSIS — Z79899 Other long term (current) drug therapy: Secondary | ICD-10-CM | POA: Diagnosis not present

## 2012-05-09 DIAGNOSIS — H02009 Unspecified entropion of unspecified eye, unspecified eyelid: Secondary | ICD-10-CM | POA: Diagnosis not present

## 2012-05-12 ENCOUNTER — Ambulatory Visit: Payer: Self-pay | Admitting: Otolaryngology

## 2012-05-12 DIAGNOSIS — Z79899 Other long term (current) drug therapy: Secondary | ICD-10-CM | POA: Diagnosis not present

## 2012-05-12 DIAGNOSIS — M1990 Unspecified osteoarthritis, unspecified site: Secondary | ICD-10-CM | POA: Diagnosis not present

## 2012-05-12 DIAGNOSIS — I498 Other specified cardiac arrhythmias: Secondary | ICD-10-CM | POA: Diagnosis not present

## 2012-05-12 DIAGNOSIS — I10 Essential (primary) hypertension: Secondary | ICD-10-CM | POA: Diagnosis not present

## 2012-05-12 DIAGNOSIS — K449 Diaphragmatic hernia without obstruction or gangrene: Secondary | ICD-10-CM | POA: Diagnosis not present

## 2012-05-12 DIAGNOSIS — H02009 Unspecified entropion of unspecified eye, unspecified eyelid: Secondary | ICD-10-CM | POA: Diagnosis not present

## 2012-05-12 DIAGNOSIS — M545 Low back pain, unspecified: Secondary | ICD-10-CM | POA: Diagnosis not present

## 2012-05-12 DIAGNOSIS — K219 Gastro-esophageal reflux disease without esophagitis: Secondary | ICD-10-CM | POA: Diagnosis not present

## 2012-05-12 HISTORY — PX: BLEPHAROPLASTY: SUR158

## 2012-05-12 HISTORY — PX: OTHER SURGICAL HISTORY: SHX169

## 2012-06-03 ENCOUNTER — Ambulatory Visit (INDEPENDENT_AMBULATORY_CARE_PROVIDER_SITE_OTHER): Payer: Medicare Other | Admitting: Internal Medicine

## 2012-06-03 ENCOUNTER — Encounter: Payer: Self-pay | Admitting: Internal Medicine

## 2012-06-03 VITALS — BP 130/80 | HR 72 | Temp 98.3°F | Resp 16 | Ht 64.0 in | Wt 142.0 lb

## 2012-06-03 DIAGNOSIS — R109 Unspecified abdominal pain: Secondary | ICD-10-CM | POA: Diagnosis not present

## 2012-06-03 DIAGNOSIS — L272 Dermatitis due to ingested food: Secondary | ICD-10-CM

## 2012-06-03 DIAGNOSIS — K589 Irritable bowel syndrome without diarrhea: Secondary | ICD-10-CM

## 2012-06-03 NOTE — Progress Notes (Signed)
Subjective:    Patient ID: Bianca Fry, female    DOB: 26-Oct-1945, 66 y.o.   MRN: [id number]  HPI  Had a fall with a bruise to the left hip HTN stable IBS and GERD...   Seeing GI and was given levsin SL which helps   Review of Systems  Constitutional: Negative for activity change, appetite change and fatigue.  HENT: Negative for ear pain, congestion, neck pain, postnasal drip and sinus pressure.   Eyes: Negative for redness and visual disturbance.  Respiratory: Negative for cough, shortness of breath and wheezing.   Gastrointestinal: Negative for abdominal pain and abdominal distention.  Genitourinary: Negative for dysuria, frequency and menstrual problem.  Musculoskeletal: Negative for myalgias, joint swelling and arthralgias.  Skin: Negative for rash and wound.  Neurological: Negative for dizziness, weakness and headaches.  Hematological: Negative for adenopathy. Does not bruise/bleed easily.  Psychiatric/Behavioral: Negative for disturbed wake/sleep cycle and decreased concentration.   Past Medical History  Diagnosis Date  . GERD (gastroesophageal reflux disease)   . Allergy   . Hyperlipidemia   . Arthritis     neck and shoulders, right fingers  . DeQuervain's disease (tenosynovitis) 10/2011    right wrist  . Hypertension     under control; has been on med. since 2009  . Erosive gastritis   . Hiatal hernia   . Internal hemorrhoids   . Colitis   . Anxiety   . Cataract   . Diverticulosis   . Ischemic colitis     History   Social History  . Marital Status: Married    Spouse Name: N/A    Number of Children: 1  . Years of Education: N/A   Occupational History  . Retired    Social History Main Topics  . Smoking status: Never Smoker   . Smokeless tobacco: Never Used  . Alcohol Use: No  . Drug Use: No  . Sexually Active: Yes   Other Topics Concern  . Not on file   Social History Narrative   Daily caffeine     Past Surgical History  Procedure Date  .  Laparoscopic lysis intestinal adhesions 1999  . Cholecystectomy 1970  . Appendectomy 1970    at same time as gallbladder  . Abdominal hysterectomy 1985    partial  . Tonsillectomy 1984  . Cataract extraction 2009    right with lens implant  . Lumbar laminectomy/decompression microdiscectomy 11/29/2007; 12/29/2007; 03/15/2008    left L4-5; fusion 5/09 surgery  . Anterior and posterior vaginal repair 12/31/2009    with TVT sling and cysto  . Dorsal compartment release 11/17/2011    Procedure: RELEASE DORSAL COMPARTMENT (DEQUERVAIN);  Surgeon: Wyn Forster., MD;  Location: [facility];  Service: Orthopedics;  Laterality: Right;  First dorsal compartment release  . Colonoscopy 02/16/12    Dr. Claudette Head    Family History  Problem Relation Age of Onset  . Adopted: Yes  . Colon cancer Neg Hx   . Diabetes Father   . Stroke Father 66  . Heart disease Mother     Allergies  Allergen Reactions  . Codeine Other (See Comments)    Chest pain  . Nitrofurantoin Other (See Comments)    Chest pain  . Percocet (Oxycodone-Acetaminophen) Nausea And Vomiting  . Propoxyphene And Methadone Nausea And Vomiting  . Vicodin (Hydrocodone-Acetaminophen) Nausea And Vomiting  . Clarithromycin Other (See Comments)    Abd. cramps  . Penicillins Rash  . Prednisone Other (See Comments)  UTI  . Sulfonamide Derivatives Other (See Comments)    GI upset    Current Outpatient Prescriptions on File Prior to Visit  Medication Sig Dispense Refill  . acetaminophen (TYLENOL) 650 MG CR tablet Take 650 mg by mouth every 8 (eight) hours as needed.      . ALPRAZolam (XANAX) 0.25 MG tablet TAKE ONE TABLET BY MOUTH THREE TIMES DAILY AS NEEDED FOR SLEEP  90 tablet  3  . aspirin 81 MG tablet Take 81 mg by mouth daily.      Marland Kitchen atorvastatin (LIPITOR) 20 MG tablet Take 1 tablet (20 mg total) by mouth 2 (two) times a week. 1 tab on Monday and friday  10 tablet  6  . benazepril-hydrochlorthiazide  (LOTENSIN HCT) 20-12.5 MG per tablet Take 1 tablet by mouth daily.  90 tablet  3  . Biotin 10 MG TABS Take by mouth daily.        . fexofenadine (ALLEGRA) 180 MG tablet Take 180 mg by mouth daily.        . multivitamin (THERAGRAN) per tablet Take 1 tablet by mouth daily.        Marland Kitchen omeprazole (PRILOSEC) 40 MG capsule Take 1 capsule (40 mg total) by mouth daily.  30 capsule  11  . DISCONTD: hyoscyamine (LEVSIN SL) 0.125 MG SL tablet Place 1 tablet (0.125 mg total) under the tongue every 4 (four) hours as needed for cramping.  60 tablet  11    BP 130/80  Pulse 72  Temp 98.3 F (36.8 C)  Resp 16  Ht 5\' 4"  (1.626 m)  Wt 142 lb (64.411 kg)  BMI 24.37 kg/m2       Objective:   Physical Exam  Nursing note and vitals reviewed. Constitutional: She is oriented to person, place, and time. She appears well-developed and well-nourished. No distress.  HENT:  Head: Normocephalic and atraumatic.  Right Ear: External ear normal.  Left Ear: External ear normal.  Nose: Nose normal.  Mouth/Throat: Oropharynx is clear and moist.  Eyes: Conjunctivae and EOM are normal. Pupils are equal, round, and reactive to light.  Neck: Normal range of motion. Neck supple. No JVD present. No tracheal deviation present. No thyromegaly present.  Cardiovascular: Normal rate, regular rhythm, normal heart sounds and intact distal pulses.   No murmur heard. Pulmonary/Chest: Effort normal and breath sounds normal. She has no wheezes. She exhibits no tenderness.  Abdominal: Soft. Bowel sounds are normal.  Musculoskeletal: Normal range of motion. She exhibits no edema and no tenderness.  Lymphadenopathy:    She has no cervical adenopathy.  Neurological: She is alert and oriented to person, place, and time. She has normal reflexes. No cranial nerve deficit.  Skin: Skin is warm and dry. She is not diaphoretic.       Purple bruise that has migrated due to the migration of the blood into the bottom of the buttox approximately  12 x 14 cm in size  Psychiatric: She has a normal mood and affect. Her behavior is normal.          Assessment & Plan:  Discussion of limiting gluten as a strategy HTN stable Possible gluten of other food intolerance? Food allergies  She has had a interval response to the Levsinex.  We will do testing for food allergies and recommend modified gluten modified lactose intolerance type diet.

## 2012-06-03 NOTE — Patient Instructions (Signed)
The patient is instructed to continue all medications as prescribed. Schedule followup with check out clerk upon leaving the clinic  

## 2012-06-07 LAB — ALLERGEN FOOD PROFILE SPECIFIC IGE
Chicken IgE: <0.1 kU/L
Fish Cod: <0.1 kU/L
IgE (Immunoglobulin E), Serum: 364 IU/mL — ABNORMAL HIGH (ref 0.0–180.0)
Milk IgE: 0.17 kU/L — ABNORMAL HIGH
Soybean IgE: 0.45 kU/L — ABNORMAL HIGH
Tuna IgE: <0.1 kU/L

## 2012-07-08 DIAGNOSIS — Z1231 Encounter for screening mammogram for malignant neoplasm of breast: Secondary | ICD-10-CM | POA: Diagnosis not present

## 2012-07-22 ENCOUNTER — Telehealth: Payer: Self-pay | Admitting: Gastroenterology

## 2012-07-22 NOTE — Telephone Encounter (Signed)
Patient advised that at this time I don't have any availability for earlier appt, but she is welcome to call on Monday and check for cancellations.  She asked that I reschedule her for now to 08/09/12 3:15

## 2012-07-28 ENCOUNTER — Ambulatory Visit: Payer: Medicare Other | Admitting: Gastroenterology

## 2012-08-08 ENCOUNTER — Telehealth (HOSPITAL_COMMUNITY): Payer: Self-pay | Admitting: Licensed Clinical Social Worker

## 2012-08-08 ENCOUNTER — Encounter (HOSPITAL_COMMUNITY): Payer: Self-pay | Admitting: Adult Health

## 2012-08-08 ENCOUNTER — Telehealth: Payer: Self-pay | Admitting: Internal Medicine

## 2012-08-08 ENCOUNTER — Telehealth: Payer: Self-pay | Admitting: Gastroenterology

## 2012-08-08 ENCOUNTER — Emergency Department (HOSPITAL_COMMUNITY)
Admission: EM | Admit: 2012-08-08 | Discharge: 2012-08-08 | Disposition: A | Discharge status: Home / Self Care | Payer: Medicare Other | Attending: Emergency Medicine | Admitting: Emergency Medicine

## 2012-08-08 ENCOUNTER — Emergency Department (HOSPITAL_COMMUNITY): Payer: Medicare Other

## 2012-08-08 DIAGNOSIS — K5731 Diverticulosis of large intestine without perforation or abscess with bleeding: Secondary | ICD-10-CM | POA: Diagnosis not present

## 2012-08-08 DIAGNOSIS — Z8739 Personal history of other diseases of the musculoskeletal system and connective tissue: Secondary | ICD-10-CM | POA: Diagnosis not present

## 2012-08-08 DIAGNOSIS — K5289 Other specified noninfective gastroenteritis and colitis: Secondary | ICD-10-CM | POA: Insufficient documentation

## 2012-08-08 DIAGNOSIS — E785 Hyperlipidemia, unspecified: Secondary | ICD-10-CM | POA: Diagnosis not present

## 2012-08-08 DIAGNOSIS — K449 Diaphragmatic hernia without obstruction or gangrene: Secondary | ICD-10-CM | POA: Diagnosis not present

## 2012-08-08 DIAGNOSIS — K529 Noninfective gastroenteritis and colitis, unspecified: Secondary | ICD-10-CM

## 2012-08-08 DIAGNOSIS — Z8659 Personal history of other mental and behavioral disorders: Secondary | ICD-10-CM | POA: Diagnosis not present

## 2012-08-08 DIAGNOSIS — R55 Syncope and collapse: Secondary | ICD-10-CM | POA: Diagnosis not present

## 2012-08-08 DIAGNOSIS — I10 Essential (primary) hypertension: Secondary | ICD-10-CM | POA: Diagnosis not present

## 2012-08-08 DIAGNOSIS — Z7982 Long term (current) use of aspirin: Secondary | ICD-10-CM | POA: Diagnosis not present

## 2012-08-08 DIAGNOSIS — Z8719 Personal history of other diseases of the digestive system: Secondary | ICD-10-CM | POA: Diagnosis not present

## 2012-08-08 DIAGNOSIS — K5790 Diverticulosis of intestine, part unspecified, without perforation or abscess without bleeding: Secondary | ICD-10-CM

## 2012-08-08 DIAGNOSIS — K5730 Diverticulosis of large intestine without perforation or abscess without bleeding: Secondary | ICD-10-CM | POA: Diagnosis not present

## 2012-08-08 DIAGNOSIS — R1032 Left lower quadrant pain: Secondary | ICD-10-CM | POA: Diagnosis not present

## 2012-08-08 LAB — CBC WITH DIFFERENTIAL/PLATELET
Basophils Absolute: 0 10*3/uL (ref 0.0–0.1)
Basophils Relative: 0 % (ref 0–1)
Hemoglobin: 13.8 g/dL (ref 12.0–15.0)
MCHC: 33.9 g/dL (ref 30.0–36.0)
Monocytes Relative: 6 % (ref 3–12)
Neutro Abs: 12.3 10*3/uL — ABNORMAL HIGH (ref 1.7–7.7)
Neutrophils Relative %: 79 % — ABNORMAL HIGH (ref 43–77)
RDW: 13.7 % (ref 11.5–15.5)

## 2012-08-08 LAB — COMPREHENSIVE METABOLIC PANEL
AST: 21 U/L (ref 0–37)
Albumin: 4.1 g/dL (ref 3.5–5.2)
Alkaline Phosphatase: 161 U/L — ABNORMAL HIGH (ref 39–117)
Chloride: 100 mEq/L (ref 96–112)
Potassium: 3.9 mEq/L (ref 3.5–5.1)
Total Bilirubin: 0.3 mg/dL (ref 0.3–1.2)

## 2012-08-08 LAB — OCCULT BLOOD, POC DEVICE: Fecal Occult Bld: POSITIVE

## 2012-08-08 MED ORDER — SODIUM CHLORIDE 0.9 % IV BOLUS (SEPSIS)
1000.0000 mL | Freq: Once | INTRAVENOUS | Status: AC
  Administered 2012-08-08: 1000 mL via INTRAVENOUS

## 2012-08-08 MED ORDER — IOHEXOL 300 MG/ML  SOLN
20.0000 mL | INTRAMUSCULAR | Status: DC
  Administered 2012-08-08: 20 mL via ORAL

## 2012-08-08 MED ORDER — IOHEXOL 300 MG/ML  SOLN
100.0000 mL | Freq: Once | INTRAMUSCULAR | Status: AC | PRN
  Administered 2012-08-08: 100 mL via INTRAVENOUS

## 2012-08-08 MED ORDER — ONDANSETRON 4 MG PO TBDP
8.0000 mg | ORAL_TABLET | Freq: Once | ORAL | Status: AC
  Administered 2012-08-08: 8 mg via ORAL
  Filled 2012-08-08: qty 2

## 2012-08-08 NOTE — ED Provider Notes (Signed)
I saw and evaluated the patient, reviewed the resident's note and I agree with the findings and plan. Pt w abd cramping, felt faint earlier. abd soft, mild left abd tenderness, no rebound or guarding. No pain out of proportion to exam, pt comfortable on recheck. Ct.  Suzi Roots, MD 08/08/12 2312

## 2012-08-08 NOTE — ED Notes (Addendum)
Reports nausea, diarrhea, emesis, weakness and blood in stool that is described as moderate amount and bright red. Pain is located under umbilicus bilateral quadrants, described as cramping. Intermittent since January 31st, this episode began this am. Dx with colitis in January, April had episode and colonoscopy that was clear.

## 2012-08-08 NOTE — Telephone Encounter (Signed)
Husband has been notified.  I have asked them to contact Dr. Lovell Sheehan if they will not go to ED today.  He reports that she has an appt tomorrow with him also.  I advised him he should not wait and at least let Dr. Lovell Sheehan know what happened this am.  He  Verbalized understanding

## 2012-08-08 NOTE — ED Notes (Signed)
Pt states that she is having severe N/v/d x 2 days.  No distress noted.

## 2012-08-08 NOTE — Telephone Encounter (Signed)
Husband returned call.  He reports that this am his wife told him she had lower abdominal pain, did not feel well,  and had to use the bathroom.  She had a BM, felt nauseous, reported her tongue "felt fuzzy", then passed out.  He reports that she was not out long and "is fine now".  He reports she has had "5 of these spells" over the last few months.  I have advised him that she should be evaluated in the ER.  He does not want to take her to [facility] I have suggested that she be evaluated then at [facility] or [facility].  He reports that he is going to wait until the appt tomorrow with Dr. Russella Dar.  They are also offered an appt today with Mike Gip PA, he also declines this appt.  I did try and stress to him that she should be evaluated in the ED or a MD today, he wants her to see Dr. Russella Dar tomorrow.

## 2012-08-08 NOTE — ED Notes (Signed)
Patient transported to CT 

## 2012-08-08 NOTE — Telephone Encounter (Signed)
°  Caller: [name]/Spouse; Patient Name: Bianca Fry; PCP: Darryll Capers (Adults only); Best Callback Phone Number: [phone].  Pt has ischemic colitis and she has had SX since this AM.  She has severe abdominal pain and has blood in her stool.  She has not fainted today.  She gets chilled and then gets soaking wet. She spoke with Dr. Russella Dar, Gastroenterologist and he wants her to be seen as she has been having recurring pain with fainting spells. Triaged Gastrointestinal Bleeding and has vomited x 1 and has had diarrhea stools x 4-5 X.  She needs to go to the ED at [facility].  Travel  care instructions given.

## 2012-08-08 NOTE — Telephone Encounter (Signed)
I can evaluate GI problems but not syncope. They need to contact Dr Lovell Sheehan now for advise on syncope eval.

## 2012-08-08 NOTE — ED Notes (Signed)
The patient is AOx4 and comfortable with her discharge instructions. 

## 2012-08-08 NOTE — ED Provider Notes (Signed)
History     CSN: [id number]  Arrival date & time 08/08/12  1554   First MD Initiated Contact with Patient 08/08/12 1804      Chief Complaint  Patient presents with  . Abdominal Pain    (Consider location/radiation/quality/duration/timing/severity/associated sxs/prior treatment) HPI CC: Syncope, and abdominal pain  66yo F w/ multiple syncopal episodes since January while on toilet. Todays episode occurred this am after pt straining to have BM. Pt had only passed a few small pellets the 4 days prior to today. Pt felt flushed and nauseaus after syncope. Was witnessed by husband who said pt came to almost immediately. States pt was pale and weak appearing after event. Over next several hours pt proceeded to pass several BM that were like diarrhea w/ some that were more orange in appearance w/ slight blood tinge. Denies any fever, HA, CP, palpitations, decreased PO, wt loss. LLQ pain today and w/o alleviating or aggrevating factors.    Past Medical History  Diagnosis Date  . GERD (gastroesophageal reflux disease)   . Allergy   . Hyperlipidemia   . Arthritis     neck and shoulders, right fingers  . DeQuervain's disease (tenosynovitis) 10/2011    right wrist  . Hypertension     under control; has been on med. since 2009  . Erosive gastritis   . Hiatal hernia   . Internal hemorrhoids   . Colitis   . Anxiety   . Cataract   . Diverticulosis   . Ischemic colitis     Past Surgical History  Procedure Date  . Laparoscopic lysis intestinal adhesions 1999  . Cholecystectomy 1970  . Appendectomy 1970    at same time as gallbladder  . Abdominal hysterectomy 1985    partial  . Tonsillectomy 1984  . Cataract extraction 2009    right with lens implant  . Lumbar laminectomy/decompression microdiscectomy 11/29/2007; 12/29/2007; 03/15/2008    left L4-5; fusion 5/09 surgery  . Anterior and posterior vaginal repair 12/31/2009    with TVT sling and cysto  . Dorsal compartment release  11/17/2011    Procedure: RELEASE DORSAL COMPARTMENT (DEQUERVAIN);  Surgeon: Wyn Forster., MD;  Location: [facility];  Service: Orthopedics;  Laterality: Right;  First dorsal compartment release  . Colonoscopy 02/16/12    Dr. Claudette Head    Family History  Problem Relation Age of Onset  . Adopted: Yes  . Colon cancer Neg Hx   . Diabetes Father   . Stroke Father 74  . Heart disease Mother     History  Substance Use Topics  . Smoking status: Never Smoker   . Smokeless tobacco: Never Used  . Alcohol Use: No    OB History    Grav Para Term Preterm Abortions TAB SAB Ect Mult Living                  Review of Systems  All other systems reviewed and are negative.    Allergies  Codeine; Nitrofurantoin; Percocet; Propoxyphene and methadone; Vicodin; Clarithromycin; Penicillins; Prednisone; and Sulfonamide derivatives  Home Medications   Current Outpatient Rx  Name Route Sig Dispense Refill  . ACETAMINOPHEN ER 650 MG PO TBCR Oral Take 650 mg by mouth every 8 (eight) hours as needed. For pain    . ALPRAZOLAM 0.25 MG PO TABS Oral Take 0.25 mg by mouth 3 (three) times daily as needed. For anxiety    . ASPIRIN 81 MG PO TABS Oral Take 81 mg by mouth  daily.    . ATORVASTATIN CALCIUM 20 MG PO TABS Oral Take 1 tablet (20 mg total) by mouth 2 (two) times a week. 1 tab on Monday and friday 10 tablet 6  . BENAZEPRIL-HYDROCHLOROTHIAZIDE 20-12.5 MG PO TABS Oral Take 1 tablet by mouth daily. 90 tablet 3  . BIOTIN 10 MG PO TABS Oral Take 1 tablet by mouth daily.     Marland Kitchen FEXOFENADINE HCL 180 MG PO TABS Oral Take 180 mg by mouth daily.      Marland Kitchen HYOSCYAMINE SULFATE 0.125 MG SL SUBL Sublingual Place 0.125 mg under the tongue every 4 (four) hours as needed. For stomach cramping    . MULTIVITAMINS PO TABS Oral Take 1 tablet by mouth daily.      Marland Kitchen OMEPRAZOLE 40 MG PO CPDR Oral Take 1 capsule (40 mg total) by mouth daily. 30 capsule 11    BP 140/66  Pulse 109  Temp 97.5 F (36.4  C) (Oral)  Resp 16  SpO2 100%  Physical Exam  Nursing note and vitals reviewed. Constitutional: She is oriented to person, place, and time. She appears well-developed and well-nourished. No distress.  HENT:  Head: Normocephalic and atraumatic.  Eyes: Pupils are equal, round, and reactive to light.  Neck: Normal range of motion.  Cardiovascular: Normal rate and intact distal pulses.   Pulmonary/Chest: No respiratory distress.  Abdominal: Soft. Normal appearance and bowel sounds are normal. She exhibits no distension and no mass. There is no rebound and no guarding.       Mild tenderness in the LLQ and w/o increased pain on palpation  Musculoskeletal: Normal range of motion.  Neurological: She is alert and oriented to person, place, and time. No cranial nerve deficit.  Skin: Skin is warm and dry. No rash noted.  Psychiatric: She has a normal mood and affect. Her behavior is normal.    ED Course  Procedures (including critical care time)  Labs Reviewed  CBC WITH DIFFERENTIAL - Abnormal; Notable for the following:    WBC 15.5 (*)     Neutrophils Relative 79 (*)     Neutro Abs 12.3 (*)     All other components within normal limits  COMPREHENSIVE METABOLIC PANEL - Abnormal; Notable for the following:    Glucose, Bld 126 (*)     Alkaline Phosphatase 161 (*)     GFR calc non Af Amer 72 (*)     GFR calc Af Amer 84 (*)     All other components within normal limits   No results found.   No diagnosis found.    MDM  66yo female w/ syncopal episode and possible bloody BM. Likely vasovagal syncope w/ recurrent IBS flare vs diverticulosis/diverticulitis.  - CBC, CMET - CT abd/pelvis - NS bolus 1L  Update: Pt hemocult card was positive. And CT w/ evidence of mild colitis and diverticulosis.  - Dx vasovagal response while having BM, and diverticulosis w/ mild colitis (unclear etiology - environmental vs autoimmune) . Pt unaware of results from allergy testing. - Pt to f/u w/ PCP  tomorrow regarding continued GI symptoms. - Stable for DC  Shelly Flatten, MD Family Medicine PGY-2 08/08/2012, 9:43 PM          Ozella Rocks, MD 08/08/12 2144

## 2012-08-08 NOTE — Telephone Encounter (Signed)
ED notification 

## 2012-08-08 NOTE — Telephone Encounter (Signed)
Left message for patient to call back  

## 2012-08-09 ENCOUNTER — Ambulatory Visit: Payer: Medicare Other

## 2012-08-09 ENCOUNTER — Encounter: Payer: Self-pay | Admitting: Gastroenterology

## 2012-08-09 ENCOUNTER — Ambulatory Visit (INDEPENDENT_AMBULATORY_CARE_PROVIDER_SITE_OTHER): Payer: Medicare Other | Admitting: Gastroenterology

## 2012-08-09 VITALS — BP 108/56 | HR 84 | Ht 63.75 in | Wt 144.1 lb

## 2012-08-09 DIAGNOSIS — R55 Syncope and collapse: Secondary | ICD-10-CM | POA: Diagnosis not present

## 2012-08-09 DIAGNOSIS — K55059 Acute (reversible) ischemia of intestine, part and extent unspecified: Secondary | ICD-10-CM | POA: Diagnosis not present

## 2012-08-09 DIAGNOSIS — K55039 Acute (reversible) ischemia of large intestine, extent unspecified: Secondary | ICD-10-CM

## 2012-08-09 MED ORDER — ONDANSETRON HCL 8 MG PO TABS: 8.0000 mg | ORAL_TABLET | Freq: Three times a day (TID) | ORAL | Status: DC | PRN

## 2012-08-09 NOTE — Progress Notes (Addendum)
History of Present Illness: This is a 66 year old female here today with her husband. She had an episode of left lower quadrant pain associated with slightly bloody diarrhea yesterday. She had a syncopal episode yesterday while on the commode. This was witnessed by her husband. She was evaluated in the emergency department where she was felt to have vasovagal syncope. She does not describe her abdominal pain as severe. Evaluation in the emergency department showed Hemoccult positive stool, a mild leukocytosis and slight left colon thickening on CT scan. She relates several episodes of syncope associated with transient abdominal pain over the past few months. Yesterday was the first episode of bloody diarrhea since May. She had a colonoscopy in April and flex sig in May showing ischemic colitis.  Current Medications, Allergies, Past Medical History, Past Surgical History, Family History and Social History were reviewed in [employer] record.  Physical Exam: General: Well developed , well nourished, fatigued appearing, no acute distress Head: Normocephalic and atraumatic Eyes:  sclerae anicteric, EOMI Ears: Normal auditory acuity Mouth: No deformity or lesions Lungs: Clear throughout to auscultation Heart: Regular rate and rhythm; no murmurs, rubs or bruits Abdomen: Soft, minimal left lower quadrant tenderness to deep palpation without rebound or guarding and non distended. No masses, hepatosplenomegaly or hernias noted. Normal Bowel sounds Rectal: Deferred with exam in the emergency department showing heme positive stool Musculoskeletal: Symmetrical with no gross deformities  Pulses:  Normal pulses noted Extremities: No clubbing, cyanosis, edema or deformities noted Neurological: Alert oriented x 4, grossly nonfocal Psychological:  Alert and cooperative. Normal mood and affect  Assessment and Recommendations:  1. Recurrent mild to moderate ischemic colitis-likely related  to diuretic useage and inadequate hydration. She does not maintain adequate hydration-does not like to drink fluids. I advised her that she needs drink at least six 8 oz. glasses of water or juice each day and this fluid total should not include caffeinated, carbonated or alcohol containing beverages. I advised her to discontinue all diuretics medications and seek a substitute antihypertensive from Dr. Lovell Sheehan' office. We scheduled her an appointment to be seen in her PCPs office tomorrow. Advised no more than 2 caffeinated beverages per day.  2. Irritable bowel syndrome. Long-term high fiber diet with adequate hydration. Hyoscyamine as needed.  3. Recurrent syncope. The emergency room evaluation diagnosed vasovagal syncope however her abdominal pain does not appear to be severe. It is certainly atypical to have vasovagal syncope from irritable bowel syndrome or mild to moderate ischemic colitis. She may have other reasons why she is more prone to syncope. Further evaluation by her primary care physician.

## 2012-08-09 NOTE — Patient Instructions (Addendum)
We have rescheduled your appointment to see the Nurse Practitioner at Dr. Lovell Sheehan office for tomorrow 08/10/12 at 8:30am to discuss your Blood Pressure/ Diuretic medication and to further evaluate syncope. If for some reason you cannot make this appointment then please call there office to reschedule and also take your Blood pressure medication every other day until the appointment with there office.   We have sent the following medications to your pharmacy for you to pick up at your convenience: Zofran.  PLEASE PUSH FLUIDS AND DO NOT DRINK ALCOHOL, CAFFEINATED BEVERAGES AND ANY SODAS!!  cc: Darryll Capers, MD

## 2012-08-10 ENCOUNTER — Ambulatory Visit (INDEPENDENT_AMBULATORY_CARE_PROVIDER_SITE_OTHER): Payer: Medicare Other | Admitting: Family

## 2012-08-10 ENCOUNTER — Encounter: Payer: Self-pay | Admitting: Family

## 2012-08-10 VITALS — BP 130/64 | HR 83 | Temp 97.9°F | Wt 146.0 lb

## 2012-08-10 DIAGNOSIS — Z23 Encounter for immunization: Secondary | ICD-10-CM

## 2012-08-10 DIAGNOSIS — R55 Syncope and collapse: Secondary | ICD-10-CM | POA: Diagnosis not present

## 2012-08-10 DIAGNOSIS — K5289 Other specified noninfective gastroenteritis and colitis: Secondary | ICD-10-CM | POA: Diagnosis not present

## 2012-08-10 DIAGNOSIS — I10 Essential (primary) hypertension: Secondary | ICD-10-CM

## 2012-08-10 DIAGNOSIS — K529 Noninfective gastroenteritis and colitis, unspecified: Secondary | ICD-10-CM

## 2012-08-10 LAB — CBC WITH DIFFERENTIAL/PLATELET
Basophils Absolute: 0 10*3/uL (ref 0.0–0.1)
Basophils Relative: 0.3 % (ref 0.0–3.0)
HCT: 37.3 % (ref 36.0–46.0)
Hemoglobin: 12.3 g/dL (ref 12.0–15.0)
Lymphocytes Relative: 26 % (ref 12.0–46.0)
Lymphs Abs: 2.3 10*3/uL (ref 0.7–4.0)
Monocytes Relative: 8.5 % (ref 3.0–12.0)
Neutro Abs: 5.6 10*3/uL (ref 1.4–7.7)
RBC: 4.43 Mil/uL (ref 3.87–5.11)
RDW: 14.5 % (ref 11.5–14.6)

## 2012-08-10 MED ORDER — LISINOPRIL 20 MG PO TABS: 20.0000 mg | ORAL_TABLET | Freq: Every day | ORAL | Status: DC

## 2012-08-10 NOTE — Patient Instructions (Signed)
Syncope Syncope is a fainting spell. This means the person loses consciousness and drops to the ground. The person is generally unconscious for less than 5 minutes. The person may have some muscle twitches for up to 15 seconds before waking up and returning to normal. Syncope occurs more often in elderly people, but it can happen to anyone. While most causes of syncope are not dangerous, syncope can be a sign of a serious medical problem. It is important to seek medical care.  CAUSES  Syncope is caused by a sudden decrease in blood flow to the brain. The specific cause is often not determined. Factors that can trigger syncope include:  Taking medicines that lower blood pressure.  Sudden changes in posture, such as standing up suddenly.  Taking more medicine than prescribed.  Standing in one place for too long.  Seizure disorders.  Dehydration and excessive exposure to heat.  Low blood sugar (hypoglycemia).  Straining to have a bowel movement.  Heart disease, irregular heartbeat, or other circulatory problems.  Fear, emotional distress, seeing blood, or severe pain. SYMPTOMS  Right before fainting, you may:  Feel dizzy or lightheaded.  Feel nauseous.  See all white or all black in your field of vision.  Have cold, clammy skin. DIAGNOSIS  Your caregiver will ask about your symptoms, perform a physical exam, and perform electrocardiography (ECG) to record the electrical activity of your heart. Your caregiver may also perform other heart or blood tests to determine the cause of your syncope. TREATMENT  In most cases, no treatment is needed. Depending on the cause of your syncope, your caregiver may recommend changing or stopping some of your medicines. HOME CARE INSTRUCTIONS  Have someone stay with you until you feel stable.  Do not drive, operate machinery, or play sports until your caregiver says it is okay.  Keep all follow-up appointments as directed by your  caregiver.  Lie down right away if you start feeling like you might faint. Breathe deeply and steadily. Wait until all the symptoms have passed.  Drink enough fluids to keep your urine clear or pale yellow.  If you are taking blood pressure or heart medicine, get up slowly, taking several minutes to sit and then stand. This can reduce dizziness. SEEK IMMEDIATE MEDICAL CARE IF:   You have a severe headache.  You have unusual pain in the chest, abdomen, or back.  You are bleeding from the mouth or rectum, or you have black or tarry stool.  You have an irregular or very fast heartbeat.  You have pain with breathing.  You have repeated fainting or seizure-like jerking during an episode.  You faint when sitting or lying down.  You have confusion.  You have difficulty walking.  You have severe weakness.  You have vision problems. If you fainted, call your local emergency services (911 in [place] Do not drive yourself to the hospital.  MAKE SURE YOU:  Understand these instructions.  Will watch your condition.  Will get help right away if you are not doing well or get worse. Document Released: 10/05/2005 Document Revised: 04/05/2012 Document Reviewed: 12/04/2011 [employer] Patient Information 2013 [employer].  

## 2012-08-10 NOTE — Progress Notes (Signed)
Subjective:    Patient ID: Bianca Fry, female    DOB: 10/03/1946, 66 y.o.   MRN: [id number]  HPI 66 year old white female, nonsmoker, patient of Dr. Lovell Sheehan is in today as an emergency department followup. She was seen at the emergency department on 08/08/2012 with another episode of colitis. She has had bouts of colitis nearly monthly since January of this year. She saw Dr. Anselm Jungling yesterday who believes that her episodes are likely related to inadequate hydration until she needs to be taken off of her diuretic. Patient had a colonoscopy in April 2013, and a flexible sigmoidoscopy in May 2013 by Dr. Anselm Jungling. Today, her symptoms have much improved. Has minimal left lower quadrant pain.  Patient also has concerns that when these episodes occur, she usually sits on the toilet and has fainting spells. Patient describes it more as a feeling of extreme weakness and dizziness. However, her husband describes it as a brief (seconds) loss of consciousness and awareness. She has had 2 episodes that are always during colitis flare and when she is sitting on the toilet. Has episodes of constipation.    Review of Systems  HENT: Negative.   Respiratory: Negative for chest tightness and shortness of breath.   Cardiovascular: Negative.  Negative for chest pain, palpitations and leg swelling.  Gastrointestinal: Positive for abdominal pain and blood in stool.  Genitourinary: Negative.   Musculoskeletal: Negative.   Skin: Negative.   Neurological: Positive for syncope and weakness.  Hematological: Negative.   Psychiatric/Behavioral: Negative.    Past Medical History  Diagnosis Date  . GERD (gastroesophageal reflux disease)   . Allergy   . Hyperlipidemia   . Arthritis     neck and shoulders, right fingers  . DeQuervain's disease (tenosynovitis) 10/2011    right wrist  . Hypertension     under control; has been on med. since 2009  . Erosive gastritis   . Hiatal hernia   . Internal hemorrhoids   .  Colitis   . Anxiety   . Cataract   . Diverticulosis   . Ischemic colitis     History   Social History  . Marital Status: Married    Spouse Name: N/A    Number of Children: 1  . Years of Education: N/A   Occupational History  . Retired    Social History Main Topics  . Smoking status: Never Smoker   . Smokeless tobacco: Never Used  . Alcohol Use: No  . Drug Use: No  . Sexually Active: Yes   Other Topics Concern  . Not on file   Social History Narrative   Daily caffeine     Past Surgical History  Procedure Date  . Laparoscopic lysis intestinal adhesions 1999  . Cholecystectomy 1970  . Appendectomy 1970    at same time as gallbladder  . Abdominal hysterectomy 1985    partial  . Tonsillectomy 1984  . Cataract extraction 2009    right with lens implant  . Lumbar laminectomy/decompression microdiscectomy 11/29/2007; 12/29/2007; 03/15/2008    left L4-5; fusion 5/09 surgery  . Anterior and posterior vaginal repair 12/31/2009    with TVT sling and cysto  . Dorsal compartment release 11/17/2011    Procedure: RELEASE DORSAL COMPARTMENT (DEQUERVAIN);  Surgeon: Wyn Forster., MD;  Location: [facility];  Service: Orthopedics;  Laterality: Right;  First dorsal compartment release  . Colonoscopy 02/16/12    Dr. Claudette Head  . Eyelid surgery 05/12/2012    right  Family History  Problem Relation Age of Onset  . Adopted: Yes  . Colon cancer Neg Hx   . Diabetes Father   . Stroke Father 65  . Heart disease Mother     Allergies  Allergen Reactions  . Codeine Other (See Comments)    Chest pain  . Nitrofurantoin Other (See Comments)    Chest pain  . Percocet (Oxycodone-Acetaminophen) Nausea And Vomiting  . Propoxyphene And Methadone Nausea And Vomiting  . Vicodin (Hydrocodone-Acetaminophen) Nausea And Vomiting  . Clarithromycin Other (See Comments)    Abd. cramps  . Penicillins Rash  . Prednisone Other (See Comments)    UTI  . Sulfonamide  Derivatives Other (See Comments)    GI upset    Current Outpatient Prescriptions on File Prior to Visit  Medication Sig Dispense Refill  . acetaminophen (TYLENOL) 650 MG CR tablet Take 650 mg by mouth every 8 (eight) hours as needed. For pain      . ALPRAZolam (XANAX) 0.25 MG tablet Take 0.25 mg by mouth 3 (three) times daily as needed. For anxiety      . aspirin 81 MG tablet Take 81 mg by mouth daily.      Marland Kitchen atorvastatin (LIPITOR) 20 MG tablet Take 1 tablet (20 mg total) by mouth 2 (two) times a week. 1 tab on Monday and friday  10 tablet  6  . Biotin 10 MG TABS Take 1 tablet by mouth daily.       . fexofenadine (ALLEGRA) 180 MG tablet Take 180 mg by mouth daily.        . hyoscyamine (LEVSIN SL) 0.125 MG SL tablet Place 0.125 mg under the tongue every 4 (four) hours as needed. For stomach cramping      . multivitamin (THERAGRAN) per tablet Take 1 tablet by mouth daily.        Marland Kitchen omeprazole (PRILOSEC) 40 MG capsule Take 1 capsule (40 mg total) by mouth daily.  30 capsule  11  . ondansetron (ZOFRAN) 8 MG tablet Take 1 tablet (8 mg total) by mouth 3 (three) times daily as needed for nausea.  90 tablet  2  . lisinopril (PRINIVIL,ZESTRIL) 20 MG tablet Take 1 tablet (20 mg total) by mouth daily.  30 tablet  3    BP 130/64  Pulse 83  Temp 97.9 F (36.6 C) (Oral)  Wt 146 lb (66.225 kg)  SpO2 95%chart    Objective:   Physical Exam  Constitutional: She is oriented to person, place, and time. She appears well-developed and well-nourished.  Neck: Normal range of motion. Neck supple.  Cardiovascular: Normal rate, regular rhythm and normal heart sounds.   Pulmonary/Chest: Effort normal and breath sounds normal.  Abdominal: Soft. She exhibits no distension. There is tenderness. There is no rebound and no guarding.       Tenderness in the left lower quadrant.  Musculoskeletal: Normal range of motion.  Neurological: She is alert and oriented to person, place, and time.  Skin: Skin is warm and dry.   Psychiatric: She has a normal mood and affect.          Assessment & Plan:  Assessment: Colitis, Valsalva-Near Syncope, Hypertension  Plan: D/C HCTZ. Start Lisinopril 20mg  once daily. Drink plenty of fluids. Although her symptoms may not be cardiac in origin (may simply be a drop in BP r/t valsalva) . I believe she should see Cardiology for confirmation that she does not have an underlying cardiovascular illness contributing to the near syncopal episodes. CBc  repeated since she was [age] 2 days ago. Followup with Dr. Lovell Sheehan as scheduled and sooner when necessary.

## 2012-08-16 DIAGNOSIS — H02059 Trichiasis without entropian unspecified eye, unspecified eyelid: Secondary | ICD-10-CM | POA: Diagnosis not present

## 2012-08-16 DIAGNOSIS — E1136 Type 2 diabetes mellitus with diabetic cataract: Secondary | ICD-10-CM | POA: Diagnosis not present

## 2012-08-26 ENCOUNTER — Telehealth: Payer: Self-pay | Admitting: Gastroenterology

## 2012-08-26 NOTE — Telephone Encounter (Signed)
Patient reports that she is constipated.  She is advised to try Miralax 1-2 doses a day.  She is advised increase the amount of fluid she is drinking.  She will call back for additional questions or concerns.

## 2012-08-29 ENCOUNTER — Emergency Department (HOSPITAL_COMMUNITY): Payer: Medicare Other

## 2012-08-29 ENCOUNTER — Encounter (HOSPITAL_COMMUNITY): Payer: Self-pay | Admitting: *Deleted

## 2012-08-29 ENCOUNTER — Emergency Department (HOSPITAL_COMMUNITY)
Admission: EM | Admit: 2012-08-29 | Discharge: 2012-08-29 | Disposition: A | Discharge status: Home / Self Care | Payer: Medicare Other | Attending: Emergency Medicine | Admitting: Emergency Medicine

## 2012-08-29 DIAGNOSIS — Z8739 Personal history of other diseases of the musculoskeletal system and connective tissue: Secondary | ICD-10-CM | POA: Diagnosis not present

## 2012-08-29 DIAGNOSIS — F411 Generalized anxiety disorder: Secondary | ICD-10-CM | POA: Insufficient documentation

## 2012-08-29 DIAGNOSIS — Z79899 Other long term (current) drug therapy: Secondary | ICD-10-CM | POA: Diagnosis not present

## 2012-08-29 DIAGNOSIS — Z7982 Long term (current) use of aspirin: Secondary | ICD-10-CM | POA: Insufficient documentation

## 2012-08-29 DIAGNOSIS — R110 Nausea: Secondary | ICD-10-CM | POA: Diagnosis not present

## 2012-08-29 DIAGNOSIS — Z8719 Personal history of other diseases of the digestive system: Secondary | ICD-10-CM | POA: Diagnosis not present

## 2012-08-29 DIAGNOSIS — R1030 Lower abdominal pain, unspecified: Secondary | ICD-10-CM

## 2012-08-29 DIAGNOSIS — R197 Diarrhea, unspecified: Secondary | ICD-10-CM | POA: Diagnosis not present

## 2012-08-29 DIAGNOSIS — K5289 Other specified noninfective gastroenteritis and colitis: Secondary | ICD-10-CM | POA: Insufficient documentation

## 2012-08-29 DIAGNOSIS — Z9849 Cataract extraction status, unspecified eye: Secondary | ICD-10-CM | POA: Diagnosis not present

## 2012-08-29 DIAGNOSIS — E785 Hyperlipidemia, unspecified: Secondary | ICD-10-CM | POA: Diagnosis not present

## 2012-08-29 DIAGNOSIS — N390 Urinary tract infection, site not specified: Secondary | ICD-10-CM | POA: Insufficient documentation

## 2012-08-29 DIAGNOSIS — I10 Essential (primary) hypertension: Secondary | ICD-10-CM | POA: Insufficient documentation

## 2012-08-29 DIAGNOSIS — K5900 Constipation, unspecified: Secondary | ICD-10-CM | POA: Insufficient documentation

## 2012-08-29 DIAGNOSIS — K219 Gastro-esophageal reflux disease without esophagitis: Secondary | ICD-10-CM | POA: Insufficient documentation

## 2012-08-29 DIAGNOSIS — R109 Unspecified abdominal pain: Secondary | ICD-10-CM | POA: Diagnosis not present

## 2012-08-29 LAB — URINE MICROSCOPIC-ADD ON

## 2012-08-29 LAB — URINALYSIS, ROUTINE W REFLEX MICROSCOPIC
Glucose, UA: NEGATIVE mg/dL
Hgb urine dipstick: NEGATIVE
Protein, ur: NEGATIVE mg/dL
Specific Gravity, Urine: 1.008 (ref 1.005–1.030)
Urobilinogen, UA: 0.2 mg/dL (ref 0.0–1.0)

## 2012-08-29 LAB — CBC WITH DIFFERENTIAL/PLATELET
Eosinophils Absolute: 0.2 10*3/uL (ref 0.0–0.7)
Hemoglobin: 13.1 g/dL (ref 12.0–15.0)
Lymphocytes Relative: 17 % (ref 12–46)
Lymphs Abs: 2.3 10*3/uL (ref 0.7–4.0)
Monocytes Relative: 9 % (ref 3–12)
Neutro Abs: 10 10*3/uL — ABNORMAL HIGH (ref 1.7–7.7)
Neutrophils Relative %: 73 % (ref 43–77)
Platelets: 191 10*3/uL (ref 150–400)
RBC: 4.66 MIL/uL (ref 3.87–5.11)
WBC: 13.7 10*3/uL — ABNORMAL HIGH (ref 4.0–10.5)

## 2012-08-29 LAB — COMPREHENSIVE METABOLIC PANEL
AST: 19 U/L (ref 0–37)
BUN: 15 mg/dL (ref 6–23)
CO2: 24 mEq/L (ref 19–32)
Calcium: 9.5 mg/dL (ref 8.4–10.5)
Chloride: 102 mEq/L (ref 96–112)
Creatinine, Ser: 0.83 mg/dL (ref 0.50–1.10)
GFR calc Af Amer: 83 mL/min — ABNORMAL LOW (ref >90)
GFR calc non Af Amer: 71 mL/min — ABNORMAL LOW (ref >90)
Glucose, Bld: 109 mg/dL — ABNORMAL HIGH (ref 70–99)
Total Bilirubin: 0.3 mg/dL (ref 0.3–1.2)

## 2012-08-29 MED ORDER — SODIUM CHLORIDE 0.9 % IV SOLN
1000.0000 mL | Freq: Once | INTRAVENOUS | Status: AC
  Administered 2012-08-29: 1000 mL via INTRAVENOUS

## 2012-08-29 MED ORDER — CIPROFLOXACIN IN D5W 400 MG/200ML IV SOLN
400.0000 mg | Freq: Once | INTRAVENOUS | Status: AC
  Administered 2012-08-29: 400 mg via INTRAVENOUS
  Filled 2012-08-29: qty 200

## 2012-08-29 MED ORDER — PHENAZOPYRIDINE HCL 200 MG PO TABS: 200.0000 mg | ORAL_TABLET | Freq: Three times a day (TID) | ORAL | Status: DC

## 2012-08-29 MED ORDER — POLYETHYLENE GLYCOL 3350 17 G PO PACK: 17.0000 g | PACK | Freq: Every day | ORAL | Status: DC

## 2012-08-29 MED ORDER — PHENAZOPYRIDINE HCL 100 MG PO TABS
200.0000 mg | ORAL_TABLET | Freq: Once | ORAL | Status: AC
  Administered 2012-08-29: 200 mg via ORAL
  Filled 2012-08-29: qty 1

## 2012-08-29 MED ORDER — ONDANSETRON HCL 4 MG/2ML IJ SOLN
4.0000 mg | Freq: Once | INTRAMUSCULAR | Status: AC
  Administered 2012-08-29: 4 mg via INTRAVENOUS
  Filled 2012-08-29: qty 2

## 2012-08-29 MED ORDER — FENTANYL CITRATE 0.05 MG/ML IJ SOLN
50.0000 ug | Freq: Once | INTRAMUSCULAR | Status: AC
  Administered 2012-08-29: 50 ug via INTRAVENOUS
  Filled 2012-08-29: qty 2

## 2012-08-29 MED ORDER — SODIUM CHLORIDE 0.9 % IV SOLN: 1000.0000 mL | INTRAVENOUS | Status: DC

## 2012-08-29 MED ORDER — CIPROFLOXACIN HCL 500 MG PO TABS: 500.0000 mg | ORAL_TABLET | Freq: Two times a day (BID) | ORAL | Status: DC

## 2012-08-29 NOTE — ED Provider Notes (Signed)
History     CSN: [id number]  Arrival date & time 08/29/12  0159   First MD Initiated Contact with Patient 08/29/12 0209      Chief Complaint  Patient presents with  . Abdominal Pain    (Consider location/radiation/quality/duration/timing/severity/associated sxs/prior treatment) HPI 66 year old female presents to emergency department complaining of nausea and persistent lower abdominal pain. Patient was seen 3 weeks ago and diagnosed with colitis. She has since seen her gastroenterologist and her primary care Dr. Her gastroenterologist feels that colitis secondary to dehydration. She's been taken off her diuretic by her primary care Dr. And started on increased fluids and MiraLAX for constipation. Patient reports she is taking MiraLAX once daily but is not seeing much improvement. She has had some episodes of loose mucousy-like stool, but does not feel she is moving her bowels well. Her pain tonight is not changed from ongoing pain for last 3 weeks. She has been prescribed Levsin for abdominal cramping. Patient woke tonight with nausea. She has not had dry heaves or vomiting, no fevers. The nausea and ongoing pain prompted her to come to the emergency department.   Past Medical History  Diagnosis Date  . GERD (gastroesophageal reflux disease)   . Allergy   . Hyperlipidemia   . Arthritis     neck and shoulders, right fingers  . DeQuervain's disease (tenosynovitis) 10/2011    right wrist  . Hypertension     under control; has been on med. since 2009  . Erosive gastritis   . Hiatal hernia   . Internal hemorrhoids   . Colitis   . Anxiety   . Cataract   . Diverticulosis   . Ischemic colitis     Past Surgical History  Procedure Date  . Laparoscopic lysis intestinal adhesions 1999  . Cholecystectomy 1970  . Appendectomy 1970    at same time as gallbladder  . Abdominal hysterectomy 1985    partial  . Tonsillectomy 1984  . Cataract extraction 2009    right with lens implant    . Lumbar laminectomy/decompression microdiscectomy 11/29/2007; 12/29/2007; 03/15/2008    left L4-5; fusion 5/09 surgery  . Anterior and posterior vaginal repair 12/31/2009    with TVT sling and cysto  . Dorsal compartment release 11/17/2011    Procedure: RELEASE DORSAL COMPARTMENT (DEQUERVAIN);  Surgeon: Wyn Forster., MD;  Location: [facility];  Service: Orthopedics;  Laterality: Right;  First dorsal compartment release  . Colonoscopy 02/16/12    Dr. Claudette Head  . Eyelid surgery 05/12/2012    right    Family History  Problem Relation Age of Onset  . Adopted: Yes  . Colon cancer Neg Hx   . Diabetes Father   . Stroke Father 34  . Heart disease Mother     History  Substance Use Topics  . Smoking status: Never Smoker   . Smokeless tobacco: Never Used  . Alcohol Use: No    OB History    Grav Para Term Preterm Abortions TAB SAB Ect Mult Living                  Review of Systems  All other systems reviewed and are negative.   other than listed in history of present illness  Allergies  Codeine; Nitrofurantoin; Percocet; Propoxyphene and methadone; Vicodin; Clarithromycin; Penicillins; Prednisone; and Sulfonamide derivatives  Home Medications   Current Outpatient Rx  Name  Route  Sig  Dispense  Refill  . ACETAMINOPHEN ER 650 MG PO TBCR  Oral   Take 650 mg by mouth every 8 (eight) hours as needed. For pain         . ALPRAZOLAM 0.25 MG PO TABS   Oral   Take 0.25 mg by mouth 3 (three) times daily as needed. For anxiety         . ASPIRIN 81 MG PO TABS   Oral   Take 81 mg by mouth daily.         . ATORVASTATIN CALCIUM 20 MG PO TABS   Oral   Take 20 mg by mouth 2 (two) times a week. 1 tab on Monday and friday         . BIOTIN 10 MG PO TABS   Oral   Take 1 tablet by mouth daily.          Marland Kitchen FEXOFENADINE HCL 180 MG PO TABS   Oral   Take 180 mg by mouth daily.           Marland Kitchen HYOSCYAMINE SULFATE 0.125 MG SL SUBL   Sublingual   Place  0.125 mg under the tongue every 4 (four) hours as needed. For stomach cramping         . LISINOPRIL 20 MG PO TABS   Oral   Take 1 tablet (20 mg total) by mouth daily.   30 tablet   3   . MULTIVITAMINS PO TABS   Oral   Take 1 tablet by mouth daily.           Marland Kitchen OMEPRAZOLE 40 MG PO CPDR   Oral   Take 1 capsule (40 mg total) by mouth daily.   30 capsule   11   . ONDANSETRON HCL 8 MG PO TABS   Oral   Take 1 tablet (8 mg total) by mouth 3 (three) times daily as needed for nausea.   90 tablet   2   . CIPROFLOXACIN HCL 500 MG PO TABS   Oral   Take 1 tablet (500 mg total) by mouth 2 (two) times daily.   14 tablet   0   . PHENAZOPYRIDINE HCL 200 MG PO TABS   Oral   Take 1 tablet (200 mg total) by mouth 3 (three) times daily.   6 tablet   0   . POLYETHYLENE GLYCOL 3350 PO PACK   Oral   Take 17 g by mouth daily. Take 17 g two to three times a day until having liquid stools, then decrease to once daily   14 each   0     BP 140/68  Pulse 96  Temp 98 F (36.7 C) (Oral)  Resp 18  SpO2 98%  Physical Exam  Nursing note and vitals reviewed. Constitutional: She is oriented to person, place, and time. She appears well-developed and well-nourished.       Appears older than stated age  HENT:  Head: Normocephalic and atraumatic.  Nose: Nose normal.       Dry mucous membranes  Eyes: Conjunctivae normal and EOM are normal. Pupils are equal, round, and reactive to light.  Neck: Normal range of motion. Neck supple. No JVD present. No tracheal deviation present. No thyromegaly present.  Cardiovascular: Normal rate, regular rhythm, normal heart sounds and intact distal pulses.  Exam reveals no gallop and no friction rub.   No murmur heard. Pulmonary/Chest: Effort normal and breath sounds normal. No stridor. No respiratory distress. She has no wheezes. She has no rales. She exhibits no tenderness.  Abdominal: Soft. Bowel  sounds are normal. She exhibits no distension and no mass.  There is tenderness (diffuse tenderness across lower abdomen). There is no rebound and no guarding.  Musculoskeletal: Normal range of motion. She exhibits no edema and no tenderness.  Lymphadenopathy:    She has no cervical adenopathy.  Neurological: She is alert and oriented to person, place, and time. She exhibits normal muscle tone. Coordination normal.  Skin: Skin is warm and dry. No rash noted. No erythema. No pallor.  Psychiatric: She has a normal mood and affect. Her behavior is normal. Judgment and thought content normal.    ED Course  Procedures (including critical care time)  Labs Reviewed  COMPREHENSIVE METABOLIC PANEL - Abnormal; Notable for the following:    Glucose, Bld 109 (*)     Alkaline Phosphatase 121 (*)     GFR calc non Af Amer 71 (*)     GFR calc Af Amer 83 (*)     All other components within normal limits  CBC WITH DIFFERENTIAL - Abnormal; Notable for the following:    WBC 13.7 (*)     Neutro Abs 10.0 (*)     Monocytes Absolute 1.2 (*)     All other components within normal limits  URINALYSIS, ROUTINE W REFLEX MICROSCOPIC - Abnormal; Notable for the following:    APPearance CLOUDY (*)     Leukocytes, UA LARGE (*)     All other components within normal limits  URINE MICROSCOPIC-ADD ON   Dg Abd 1 View  08/29/2012  *RADIOLOGY REPORT*  Clinical Data: Nausea, pain.  ABDOMEN - 1 VIEW  Comparison: CT 08/08/2012  Findings: Nonobstructive bowel gas pattern.  Large stool burden throughout the colon, particularly the left colon.  No evidence of free air, organomegaly or suspicious calcification.  Postoperative changes in the lower lumbar spine.  IMPRESSION: Large stool burden.  No acute findings.   Original Report Authenticated By: Charlett Nose, M.D.      1. Urinary tract infection   2. Lower abdominal pain   3. Nausea   4. Constipation       MDM  66 year old female with persistent lower abdominal pain and nausea tonight. Patient noted to have urinary tract  infection. Will treat for same. KUB shows large stool burden. Patient encouraged to increase her MiraLAX dosing until she is having liquid stools and then back off. Slight elevation in her white blood cell count, but no fever and nonacute abdomen. Do not feel patient requires repeat CT scan as she just had it done 3 weeks ago. Patient encouraged to follow back up with her primary care Dr. for recheck.       Olivia Mackie, MD 08/29/12 [phone]

## 2012-08-29 NOTE — ED Notes (Signed)
C/o abd pain, onset 3 weeks ago, dx'd with colitis 3 weeks ago, also reports constipation, and nausea. Has not had normal BM since 10/21 (last bout with colitis). denies other sx.

## 2012-08-29 NOTE — ED Notes (Signed)
Patient transported to X-ray 

## 2012-08-30 ENCOUNTER — Encounter: Payer: Self-pay | Admitting: Family

## 2012-08-30 ENCOUNTER — Ambulatory Visit (INDEPENDENT_AMBULATORY_CARE_PROVIDER_SITE_OTHER): Payer: Medicare Other | Admitting: Family

## 2012-08-30 VITALS — BP 122/82 | HR 84 | Temp 98.2°F | Wt 148.0 lb

## 2012-08-30 DIAGNOSIS — K5900 Constipation, unspecified: Secondary | ICD-10-CM

## 2012-08-30 DIAGNOSIS — R109 Unspecified abdominal pain: Secondary | ICD-10-CM | POA: Diagnosis not present

## 2012-08-30 DIAGNOSIS — N390 Urinary tract infection, site not specified: Secondary | ICD-10-CM

## 2012-08-30 NOTE — Progress Notes (Signed)
Subjective:    Patient ID: Bianca Fry, female    DOB: Mar 30, 1946, 66 y.o.   MRN: [id number]  HPI 66 year old white female, nonsmoker, patient of Dr. Lovell Sheehan is in as an emergency department followup. She was seen on yesterday at the emergency department and was diagnosed with constipation and urinary tract infection. She's been taking MiraLax to try to help relieve her constipation that has been ineffective at this point. She continues to have abdominal pain particularly to the left lower quadrant. Rates the pain a 5/10, describes it as intermittent. She has had issues with her bowel pattern for many years. She has a history of colitis. Has tried enemas and suppositories in the past that have been ineffective   Review of Systems  Constitutional: Negative.  Negative for fever and fatigue.  Respiratory: Negative.   Cardiovascular: Negative.   Gastrointestinal: Positive for abdominal pain and constipation. Negative for diarrhea, blood in stool and abdominal distention.  Genitourinary: Negative.   Musculoskeletal: Negative.   Skin: Negative.   Neurological: Negative.   Hematological: Negative.   Psychiatric/Behavioral: Negative.    Past Medical History  Diagnosis Date  . GERD (gastroesophageal reflux disease)   . Allergy   . Hyperlipidemia   . Arthritis     neck and shoulders, right fingers  . DeQuervain's disease (tenosynovitis) 10/2011    right wrist  . Hypertension     under control; has been on med. since 2009  . Erosive gastritis   . Hiatal hernia   . Internal hemorrhoids   . Colitis   . Anxiety   . Cataract   . Diverticulosis   . Ischemic colitis     History   Social History  . Marital Status: Married    Spouse Name: N/A    Number of Children: 1  . Years of Education: N/A   Occupational History  . Retired    Social History Main Topics  . Smoking status: Never Smoker   . Smokeless tobacco: Never Used  . Alcohol Use: No  . Drug Use: No  . Sexually Active: Yes    Other Topics Concern  . Not on file   Social History Narrative   Daily caffeine     Past Surgical History  Procedure Date  . Laparoscopic lysis intestinal adhesions 1999  . Cholecystectomy 1970  . Appendectomy 1970    at same time as gallbladder  . Abdominal hysterectomy 1985    partial  . Tonsillectomy 1984  . Cataract extraction 2009    right with lens implant  . Lumbar laminectomy/decompression microdiscectomy 11/29/2007; 12/29/2007; 03/15/2008    left L4-5; fusion 5/09 surgery  . Anterior and posterior vaginal repair 12/31/2009    with TVT sling and cysto  . Dorsal compartment release 11/17/2011    Procedure: RELEASE DORSAL COMPARTMENT (DEQUERVAIN);  Surgeon: Wyn Forster., MD;  Location: [facility];  Service: Orthopedics;  Laterality: Right;  First dorsal compartment release  . Colonoscopy 02/16/12    Dr. Claudette Head  . Eyelid surgery 05/12/2012    right    Family History  Problem Relation Age of Onset  . Adopted: Yes  . Colon cancer Neg Hx   . Diabetes Father   . Stroke Father 42  . Heart disease Mother     Allergies  Allergen Reactions  . Codeine Other (See Comments)    Chest pain  . Nitrofurantoin Other (See Comments)    Chest pain  . Percocet (Oxycodone-Acetaminophen) Nausea And Vomiting  .  Propoxyphene And Methadone Nausea And Vomiting  . Vicodin (Hydrocodone-Acetaminophen) Nausea And Vomiting  . Clarithromycin Other (See Comments)    Abd. cramps  . Penicillins Rash  . Prednisone Other (See Comments)    UTI  . Sulfonamide Derivatives Other (See Comments)    GI upset    Current Outpatient Prescriptions on File Prior to Visit  Medication Sig Dispense Refill  . acetaminophen (TYLENOL) 650 MG CR tablet Take 650 mg by mouth every 8 (eight) hours as needed. For pain      . ALPRAZolam (XANAX) 0.25 MG tablet Take 0.25 mg by mouth 3 (three) times daily as needed. For anxiety      . aspirin 81 MG tablet Take 81 mg by mouth daily.        Marland Kitchen atorvastatin (LIPITOR) 20 MG tablet Take 20 mg by mouth 2 (two) times a week. 1 tab on Monday and friday      . Biotin 10 MG TABS Take 1 tablet by mouth daily.       . ciprofloxacin (CIPRO) 500 MG tablet Take 1 tablet (500 mg total) by mouth 2 (two) times daily.  14 tablet  0  . fexofenadine (ALLEGRA) 180 MG tablet Take 180 mg by mouth daily.        . hyoscyamine (LEVSIN SL) 0.125 MG SL tablet Place 0.125 mg under the tongue every 4 (four) hours as needed. For stomach cramping      . lisinopril (PRINIVIL,ZESTRIL) 20 MG tablet Take 1 tablet (20 mg total) by mouth daily.  30 tablet  3  . multivitamin (THERAGRAN) per tablet Take 1 tablet by mouth daily.        Marland Kitchen omeprazole (PRILOSEC) 40 MG capsule Take 1 capsule (40 mg total) by mouth daily.  30 capsule  11  . ondansetron (ZOFRAN) 8 MG tablet Take 1 tablet (8 mg total) by mouth 3 (three) times daily as needed for nausea.  90 tablet  2  . phenazopyridine (PYRIDIUM) 200 MG tablet Take 1 tablet (200 mg total) by mouth 3 (three) times daily.  6 tablet  0  . polyethylene glycol (MIRALAX / GLYCOLAX) packet Take 17 g by mouth daily. Take 17 g two to three times a day until having liquid stools, then decrease to once daily  14 each  0    BP 122/82  Pulse 84  Temp 98.2 F (36.8 C) (Oral)  Wt 148 lb (67.132 kg)  SpO2 95%chart    Objective:   Physical Exam  Constitutional: She is oriented to person, place, and time. She appears well-developed and well-nourished.  Neck: Normal range of motion. Neck supple.  Cardiovascular: Normal rate, regular rhythm and normal heart sounds.   Pulmonary/Chest: Effort normal and breath sounds normal.  Abdominal: Soft. There is tenderness.       Tenderness to the left lower quadrant. No rebound tenderness or guarding  Musculoskeletal: Normal range of motion.  Neurological: She is alert and oriented to person, place, and time.  Skin: Skin is warm and dry.  Psychiatric: She has a normal mood and affect.           Assessment & Plan:  Assessment: Constipation-uncontrolled, urinary tract infection  Plan: Magnesium Site-Rite one half bottle by mouth x1. If no results in 4-6 hours, repeat the other half. Continue to drink plenty of fluids. In the future we may use Senokot to help her symptoms. Patient advised to call the office if she had an increase in abdominal pain, develops fevers,  chills or myalgias. KUB was negative for blockage.

## 2012-08-30 NOTE — Patient Instructions (Addendum)
Magnesium Citrate 1/2 bottle by mouth x 1. Increase water intake.   Constipation, Adult Constipation is when a person has fewer than 3 bowel movements a week; has difficulty having a bowel movement; or has stools that are dry, hard, or larger than normal. As people grow older, constipation is more common. If you try to fix constipation with medicines that make you have a bowel movement (laxatives), the problem may get worse. Long-term laxative use may cause the muscles of the colon to become weak. A low-fiber diet, not taking in enough fluids, and taking certain medicines may make constipation worse. CAUSES   Certain medicines, such as antidepressants, pain medicine, iron supplements, antacids, and water pills.   Certain diseases, such as diabetes, irritable bowel syndrome (IBS), thyroid disease, or depression.   Not drinking enough water.   Not eating enough fiber-rich foods.   Stress or travel.  Lack of physical activity or exercise.  Not going to the restroom when there is the urge to have a bowel movement.  Ignoring the urge to have a bowel movement.  Using laxatives too much. SYMPTOMS   Having fewer than 3 bowel movements a week.   Straining to have a bowel movement.   Having hard, dry, or larger than normal stools.   Feeling full or bloated.   Pain in the lower abdomen.  Not feeling relief after having a bowel movement. DIAGNOSIS  Your caregiver will take a medical history and perform a physical exam. Further testing may be done for severe constipation. Some tests may include:   A barium enema X-ray to examine your rectum, colon, and sometimes, your small intestine.  A sigmoidoscopy to examine your lower colon.  A colonoscopy to examine your entire colon. TREATMENT  Treatment will depend on the severity of your constipation and what is causing it. Some dietary treatments include drinking more fluids and eating more fiber-rich foods. Lifestyle treatments may  include regular exercise. If these diet and lifestyle recommendations do not help, your caregiver may recommend taking over-the-counter laxative medicines to help you have bowel movements. Prescription medicines may be prescribed if over-the-counter medicines do not work.  HOME CARE INSTRUCTIONS   Increase dietary fiber in your diet, such as fruits, vegetables, whole grains, and beans. Limit high-fat and processed sugars in your diet, such as [place] fries, hamburgers, cookies, candies, and soda.   A fiber supplement may be added to your diet if you cannot get enough fiber from foods.   Drink enough fluids to keep your urine clear or pale yellow.   Exercise regularly or as directed by your caregiver.   Go to the restroom when you have the urge to go. Do not hold it.  Only take medicines as directed by your caregiver. Do not take other medicines for constipation without talking to your caregiver first. SEEK IMMEDIATE MEDICAL CARE IF:   You have bright red blood in your stool.   Your constipation lasts for more than 4 days or gets worse.   You have abdominal or rectal pain.   You have thin, pencil-like stools.  You have unexplained weight loss. MAKE SURE YOU:   Understand these instructions.  Will watch your condition.  Will get help right away if you are not doing well or get worse. Document Released: 07/03/2004 Document Revised: 12/28/2011 Document Reviewed: 09/08/2011 [facility] Patient Information 2013 [place], [place].

## 2012-08-31 ENCOUNTER — Telehealth: Payer: Self-pay | Admitting: Gastroenterology

## 2012-08-31 ENCOUNTER — Telehealth: Payer: Self-pay

## 2012-08-31 MED ORDER — PEG-KCL-NACL-NASULF-NA ASC-C 100 G PO SOLR: 1.0000 | Freq: Once | ORAL | Status: DC

## 2012-08-31 NOTE — Telephone Encounter (Signed)
.  h 

## 2012-08-31 NOTE — Telephone Encounter (Signed)
Patient aware.  She will try the Moviprep

## 2012-08-31 NOTE — Telephone Encounter (Signed)
OK for colon prep Remain on clears before and during prep Miralax qd to bid starting after the prep to prevent recurrent contipation

## 2012-08-31 NOTE — Telephone Encounter (Signed)
Patient was seen in the ER for UTI and constipation. Prior to going to the ER she had tried Miralax BID and an enema with no results.    She had a KUB that showed a large stool burden throughout the colon.  She was told to follow up with Dr. Lovell Sheehan.  She was seen in his office by the PA yesterday and she prescribed Mag Citrate.  She has not had any results.  She reports she is having only liquid stools and only a small amount at a time.  Dr. Russella Dar is it ok to send her in a colon prep or advise additional orders?

## 2012-08-31 NOTE — Telephone Encounter (Signed)
Pt c/o not having a BM after drinking magnesium citrate yesterday. She states that she can't wait any longer to see if it will work.   In the pt chart there is a note from GI stating that pt should call back with questions or concerns after taking Miralax bid. Advised pt that she should call Dr. Ardell Isaacs office for advise on constipation and pain issues. Pt verbalized understanding and will call GI

## 2012-09-01 ENCOUNTER — Emergency Department (HOSPITAL_COMMUNITY): Payer: Medicare Other

## 2012-09-01 ENCOUNTER — Emergency Department (HOSPITAL_COMMUNITY)
Admission: EM | Admit: 2012-09-01 | Discharge: 2012-09-01 | Disposition: A | Discharge status: Home / Self Care | Payer: Medicare Other | Attending: Emergency Medicine | Admitting: Emergency Medicine

## 2012-09-01 ENCOUNTER — Encounter (HOSPITAL_COMMUNITY): Payer: Self-pay | Admitting: Emergency Medicine

## 2012-09-01 DIAGNOSIS — Z9089 Acquired absence of other organs: Secondary | ICD-10-CM | POA: Insufficient documentation

## 2012-09-01 DIAGNOSIS — K5900 Constipation, unspecified: Secondary | ICD-10-CM | POA: Diagnosis not present

## 2012-09-01 DIAGNOSIS — M129 Arthropathy, unspecified: Secondary | ICD-10-CM | POA: Insufficient documentation

## 2012-09-01 DIAGNOSIS — R141 Gas pain: Secondary | ICD-10-CM | POA: Diagnosis not present

## 2012-09-01 DIAGNOSIS — E785 Hyperlipidemia, unspecified: Secondary | ICD-10-CM | POA: Diagnosis not present

## 2012-09-01 DIAGNOSIS — Z7982 Long term (current) use of aspirin: Secondary | ICD-10-CM | POA: Insufficient documentation

## 2012-09-01 DIAGNOSIS — K219 Gastro-esophageal reflux disease without esophagitis: Secondary | ICD-10-CM | POA: Insufficient documentation

## 2012-09-01 DIAGNOSIS — Z79899 Other long term (current) drug therapy: Secondary | ICD-10-CM | POA: Insufficient documentation

## 2012-09-01 DIAGNOSIS — R142 Eructation: Secondary | ICD-10-CM | POA: Diagnosis not present

## 2012-09-01 DIAGNOSIS — F411 Generalized anxiety disorder: Secondary | ICD-10-CM | POA: Insufficient documentation

## 2012-09-01 DIAGNOSIS — I10 Essential (primary) hypertension: Secondary | ICD-10-CM | POA: Diagnosis not present

## 2012-09-01 DIAGNOSIS — Z9889 Other specified postprocedural states: Secondary | ICD-10-CM | POA: Insufficient documentation

## 2012-09-01 DIAGNOSIS — R143 Flatulence: Secondary | ICD-10-CM | POA: Diagnosis not present

## 2012-09-01 DIAGNOSIS — K5730 Diverticulosis of large intestine without perforation or abscess without bleeding: Secondary | ICD-10-CM | POA: Diagnosis not present

## 2012-09-01 LAB — CBC WITH DIFFERENTIAL/PLATELET
Basophils Absolute: 0 10*3/uL (ref 0.0–0.1)
Basophils Relative: 0 % (ref 0–1)
Eosinophils Absolute: 0.1 10*3/uL (ref 0.0–0.7)
Eosinophils Relative: 0 % (ref 0–5)
HCT: 36.9 % (ref 36.0–46.0)
Hemoglobin: 12.2 g/dL (ref 12.0–15.0)
MCH: 28.2 pg (ref 26.0–34.0)
MCHC: 33.1 g/dL (ref 30.0–36.0)
Monocytes Absolute: 1.2 10*3/uL — ABNORMAL HIGH (ref 0.1–1.0)
Monocytes Relative: 7 % (ref 3–12)
RDW: 13.6 % (ref 11.5–15.5)

## 2012-09-01 LAB — COMPREHENSIVE METABOLIC PANEL
Albumin: 3.4 g/dL — ABNORMAL LOW (ref 3.5–5.2)
BUN: 13 mg/dL (ref 6–23)
Calcium: 8.5 mg/dL (ref 8.4–10.5)
Creatinine, Ser: 0.81 mg/dL (ref 0.50–1.10)
Total Bilirubin: 0.3 mg/dL (ref 0.3–1.2)
Total Protein: 6.4 g/dL (ref 6.0–8.3)

## 2012-09-01 LAB — URINALYSIS, ROUTINE W REFLEX MICROSCOPIC
Bilirubin Urine: NEGATIVE
Glucose, UA: NEGATIVE mg/dL
Ketones, ur: NEGATIVE mg/dL
Protein, ur: NEGATIVE mg/dL
pH: 7 (ref 5.0–8.0)

## 2012-09-01 MED ORDER — DICYCLOMINE HCL 10 MG PO CAPS
10.0000 mg | ORAL_CAPSULE | Freq: Once | ORAL | Status: AC
  Administered 2012-09-01: 10 mg via ORAL
  Filled 2012-09-01: qty 1

## 2012-09-01 MED ORDER — PHOSPHATE ENEMA 7-19 GM/118ML RE ENEM: 1.0000 | ENEMA | Freq: Once | RECTAL | Status: DC | PRN

## 2012-09-01 MED ORDER — BISACODYL 10 MG RE SUPP
10.0000 mg | Freq: Once | RECTAL | Status: AC
  Administered 2012-09-01: 10 mg via RECTAL
  Filled 2012-09-01: qty 1

## 2012-09-01 MED ORDER — IOHEXOL 300 MG/ML  SOLN
100.0000 mL | Freq: Once | INTRAMUSCULAR | Status: AC | PRN
  Administered 2012-09-01: 100 mL via INTRAVENOUS

## 2012-09-01 MED ORDER — IOHEXOL 300 MG/ML  SOLN
20.0000 mL | INTRAMUSCULAR | Status: AC
  Administered 2012-09-01 (×2): 20 mL via ORAL

## 2012-09-01 MED ORDER — SODIUM CHLORIDE 0.9 % IV BOLUS (SEPSIS)
1000.0000 mL | Freq: Once | INTRAVENOUS | Status: AC
  Administered 2012-09-01: 1000 mL via INTRAVENOUS

## 2012-09-01 MED ORDER — GLYCERIN (LAXATIVE) 2 G RE SUPP: 1.0000 | Freq: Once | RECTAL | Status: DC

## 2012-09-01 MED ORDER — LORAZEPAM 2 MG/ML IJ SOLN
1.0000 mg | Freq: Once | INTRAMUSCULAR | Status: AC
  Administered 2012-09-01: 1 mg via INTRAVENOUS
  Filled 2012-09-01: qty 1

## 2012-09-01 MED ORDER — MILK AND MOLASSES ENEMA
Freq: Once | RECTAL | Status: AC
  Administered 2012-09-01: 06:00:00 via RECTAL
  Filled 2012-09-01: qty 250

## 2012-09-01 MED ORDER — ONDANSETRON HCL 4 MG/2ML IJ SOLN
4.0000 mg | Freq: Once | INTRAMUSCULAR | Status: AC
  Administered 2012-09-01: 4 mg via INTRAVENOUS
  Filled 2012-09-01: qty 2

## 2012-09-01 NOTE — ED Provider Notes (Signed)
History   CSN: [id number]  Arrival date & time 09/01/12 0221  First MD Initiated Contact with Patient 09/01/12 0230  Chief Complaint   Patient presents with   .  Constipation   (Consider location/radiation/quality/duration/timing/severity/associated sxs/prior  treatment)  HPI Comments: Bianca Fry  66 y.o.  female  The chief complaint is: Patient presents with:  Constipation  Patient presents with chief complaint of abdominal pain. She has a history of ischemic colitis. Patient was seen here 3 days ago. Assessment at that time showed large stool burden. Patient was given MiraLax and fluids. She also saw gastroenterology since then. She has taken bowel prep and magnesium citrate without relief of her constipation. She states she had one very small bowel movement yesterday. In several beer a small liquid stools but has not had a real bowel movement. She states pain is in her lower abdomen. She also has associated abdominal spasm and tenesmus.  Denies blood in her stool.  +N/V  Denies fevers, chills, myalgias, arthralgias. Denies DOE, SOB, chest tightness or pressure, radiation to left arm, jaw or back, or diaphoresis. Denies dysuria, flank pain, suprapubic pain, frequency, urgency, or hematuria. Denies headaches, light headedness, weakness, visual disturbances.  The history is provided by the patient and medical records. No language interpreter was used.  Past Medical History   Diagnosis  Date   .  GERD (gastroesophageal reflux disease)    .  Allergy    .  Hyperlipidemia    .  Arthritis      neck and shoulders, right fingers   .  DeQuervain's disease (tenosynovitis)  10/2011     right wrist   .  Hypertension      under control; has been on med. since 2009   .  Erosive gastritis    .  Hiatal hernia    .  Internal hemorrhoids    .  Colitis    .  Anxiety    .  Cataract    .  Diverticulosis    .  Ischemic colitis     Past Surgical History   Procedure  Date   .  Laparoscopic lysis  intestinal adhesions  1999   .  Cholecystectomy  1970   .  Appendectomy  1970     at same time as gallbladder   .  Abdominal hysterectomy  1985     partial   .  Tonsillectomy  1984   .  Cataract extraction  2009     right with lens implant   .  Lumbar laminectomy/decompression microdiscectomy  11/29/2007; 12/29/2007; 03/15/2008     left L4-5; fusion 5/09 surgery   .  Anterior and posterior vaginal repair  12/31/2009     with TVT sling and cysto   .  Dorsal compartment release  11/17/2011     Procedure: RELEASE DORSAL COMPARTMENT (DEQUERVAIN); Surgeon: Wyn Forster., MD; Location: [facility]; Service: Orthopedics; Laterality: Right; First dorsal compartment release   .  Colonoscopy  02/16/12     Dr. Claudette Head   .  Eyelid surgery  05/12/2012     right    Family History   Problem  Relation  Age of Onset   .  Adopted: Yes   .  Colon cancer  Neg Hx    .  Diabetes  Father    .  Stroke  Father  34   .  Heart disease  Mother     History   Substance  Use Topics   .  Smoking status:  Never Smoker   .  Smokeless tobacco:  Never Used   .  Alcohol Use:  No    OB History    Grav  Para  Term  Preterm  Abortions  TAB  SAB  Ect  Mult  Living                 Review of Systems  All other systems reviewed and are negative.  All other systems negative except those noted in the HPI  Allergies   Codeine; Nitrofurantoin; Percocet; Propoxyphene and methadone; Vicodin; Clarithromycin; Penicillins; Prednisone; and Sulfonamide derivatives  Home Medications    Current Outpatient Rx   Name   Route   Sig   Dispense   Refill   .  ACETAMINOPHEN ER 650 MG PO TBCR   Oral   Take 650 mg by mouth every 8 (eight) hours as needed. For pain       .  ALPRAZOLAM 0.25 MG PO TABS   Oral   Take 0.25 mg by mouth 3 (three) times daily as needed. For anxiety       .  ASPIRIN 81 MG PO TABS   Oral   Take 81 mg by mouth daily.       .  ATORVASTATIN CALCIUM 20 MG PO TABS   Oral   Take 20 mg by mouth 2  (two) times a week. 1 tab on Monday and friday       .  BIOTIN 10 MG PO TABS   Oral   Take 1 tablet by mouth daily.       Marland Kitchen  CIPROFLOXACIN HCL 500 MG PO TABS   Oral   Take 1 tablet (500 mg total) by mouth 2 (two) times daily.   14 tablet   0   .  FEXOFENADINE HCL 180 MG PO TABS   Oral   Take 180 mg by mouth daily.       Marland Kitchen  HYOSCYAMINE SULFATE 0.125 MG SL SUBL   Sublingual   Place 0.125 mg under the tongue every 4 (four) hours as needed. For stomach cramping       .  LISINOPRIL 20 MG PO TABS   Oral   Take 1 tablet (20 mg total) by mouth daily.   30 tablet   3   .  MULTIVITAMINS PO TABS   Oral   Take 1 tablet by mouth daily.       Marland Kitchen  OMEPRAZOLE 40 MG PO CPDR   Oral   Take 1 capsule (40 mg total) by mouth daily.   30 capsule   11   .  ONDANSETRON HCL 8 MG PO TABS   Oral   Take 1 tablet (8 mg total) by mouth 3 (three) times daily as needed for nausea.   90 tablet   2   .  PEG-KCL-NACL-NASULF-NA ASC-C 100 G PO SOLR   Oral   Take 1 kit (100 g total) by mouth once.   1 kit   0     Dispense as written.   Marland Kitchen  PHENAZOPYRIDINE HCL 200 MG PO TABS   Oral   Take 1 tablet (200 mg total) by mouth 3 (three) times daily.   6 tablet   0   .  POLYETHYLENE GLYCOL 3350 PO PACK   Oral   Take 17 g by mouth daily. Take 17 g two to three times a day until having liquid stools, then  decrease to once daily   14 each   0   BP 107/61  Pulse 103  Temp 97.7 F (36.5 C) (Oral)  Resp 16  SpO2 95%  Physical Exam  Constitutional: She is oriented to person, place, and time. She appears well-developed and well-nourished. No distress.  HENT:  Head: Normocephalic and atraumatic.  Eyes: Conjunctivae normal are normal. No scleral icterus.  Neck: Normal range of motion.  Cardiovascular: Normal rate, regular rhythm and normal heart sounds. Exam reveals no gallop and no friction rub.  No murmur heard.  Pulmonary/Chest: Effort normal and breath sounds normal. No respiratory distress.  Abdominal: Soft. She exhibits distension. There is  tenderness in the left upper quadrant and left lower quadrant. There is no rigidity, no rebound, no guarding, no tenderness at McBurney's point and negative Murphy's sign.  Abdomen Soft. Distention in the R/LLQ. BS auscultated only in the UQs. TTP BL LQs.  No peritoneal signs. No rigidity, guarding.  Neurological: She is alert and oriented to person, place, and time.  Skin: Skin is warm and dry. She is not diaphoretic.  ED Course   Procedures (including critical care time)  Labs Reviewed   CBC WITH DIFFERENTIAL - Abnormal; Notable for the following:    WBC  16.4 (*)      Neutrophils Relative  86 (*)      Neutro Abs  14.0 (*)      Lymphocytes Relative  7 (*)      Monocytes Absolute  1.2 (*)      All other components within normal limits   COMPREHENSIVE METABOLIC PANEL - Abnormal; Notable for the following:    Glucose, Bld  155 (*)      Albumin  3.4 (*)      GFR calc non Af Amer  73 (*)      GFR calc Af Amer  85 (*)      All other components within normal limits   URINALYSIS, ROUTINE W REFLEX MICROSCOPIC   Ct Abdomen Pelvis W Contrast  09/01/2012 *RADIOLOGY REPORT* Clinical Data: Abdominal pain. CT ABDOMEN AND PELVIS WITH CONTRAST Technique: Multidetector CT imaging of the abdomen and pelvis was performed following the standard protocol during bolus administration of intravenous contrast. Contrast: OMNIPAQUE IOHEXOL 300 MG/ML SOLN Comparison: 08/08/2012 Findings: Bibasilar atelectasis. No effusions. Heart is normal size. Small low-density lesion in the right hepatic lobe, measuring 13 mm, stable, most likely hemangioma. Spleen, pancreas, adrenals and kidneys are unremarkable. There is a significant dilatation of the colon which is diffusely fluid-filled. This is a new finding since prior study. Colon becomes normal caliber in the sigmoid colon. There are sigmoid diverticula. No definite changes of active diverticulitis. Small bowel is decompressed. Trace free fluid in the pelvis. No free  air or adenopathy. Prior hysterectomy. No adnexal masses. Urinary bladder is unremarkable. Aorta is normal caliber. Small hiatal hernia. No acute bony abnormality. IMPRESSION: Dilated colon which is fluid filled to the sigmoid colon. This is of unknown etiology. Question severe gastroenteritis. There is sigmoid diverticulosis without CT evidence for active diverticulitis. Original Report Authenticated By: Charlett Nose, M.D.  Dg Abd Acute W/chest  09/01/2012 *RADIOLOGY REPORT* Clinical Data: Abdominal pain, vomiting. ACUTE ABDOMEN SERIES (ABDOMEN 2 VIEW & CHEST 1 VIEW) Comparison: 08/29/2012. Chest x-ray 09/15/2011. Findings: Nonspecific bowel gas pattern. Gas within mildly prominent right colon. Air-fluid levels noted in the right side of the colon. Paucity of gas elsewhere throughout the abdomen. No free air. No organomegaly or suspicious calcification. Postoperative changes  in the lower lumbar spine. No acute bony abnormality. Lungs are clear. No effusions. Heart is normal size. IMPRESSION: Nonspecific bowel gas pattern with mild gaseous distention and air- fluid levels in the right colon. Original Report Authenticated By: Charlett Nose, M.D.  No diagnosis found.  MDM   Patient white count elevated since last visit. Plain films show stool burden and right colonic air fluid patterns. WIll re-CT the abdomen to check for occutl perf.  .patietn with constipation and liquid stool in colon. I have given dulcolax suppository and molasses enema, with little relief. Patient feels that she may need to go to the bathroom and would like to wait a lttle while. I have given report ro PA Dahlia Client who has agreed to assume care of the patient.  Arthor Captain, PA-C  09/01/12 1610  6:30 Bianca Fry is a 66 year old female, who presents to the emergency department with chief complaint of constipation. I received signout from [facility], who tells me that the patient is stable, and that the plan is to discharge the patient in an  hour. Patient is to followup with GI. Patient is to continue using a laxative and suppositories until she has bowel movement.   6:51 AM Dr. Nicanor Alcon tells me to discharge the patient.  Patient is stable and ready for discharge.  [name], PA-C 09/01/12 [phone]

## 2012-09-01 NOTE — ED Notes (Signed)
Pt. States that she hasn't had a good bowel movement since October 21st; pt. Was seen for colitis at that time; pt. Was also seen by physician and was told to take Miralax last Sunday/Monday morning with no relief.  Pt. Nauseated and vomited once in past 24 hrs.  Pt. Has lower abdominal pain and was diagnosed with a UTI on Monday.

## 2012-09-01 NOTE — ED Notes (Signed)
Patient transported to X-ray 

## 2012-09-01 NOTE — ED Notes (Signed)
Patient transported to CT 

## 2012-09-01 NOTE — ED Provider Notes (Signed)
Medical screening examination/treatment/procedure(s) were conducted as a shared visit with non-physician practitioner(s) and myself.  I personally evaluated the patient during the encounter  NCAT, EOMI RRR CTAB NABS, soft, no guarding no rebound  Chae Shuster K Odaliz Mcqueary-Rasch, MD 09/01/12 [phone]

## 2012-09-01 NOTE — ED Provider Notes (Signed)
Medical screening examination/treatment/procedure(s) were conducted as a shared visit with non-physician practitioner(s) and myself.  I personally evaluated the patient during the encounter  RRR CTAB NABS, soft, non tender  CT performed enema attempted, continue miralax and glycerine suppositiories  Denelle Capurro K Nixie Laube-Rasch, MD 09/01/12 [phone]

## 2012-09-01 NOTE — ED Provider Notes (Signed)
History     CSN: [id number]  Arrival date & time 09/01/12  0221   First MD Initiated Contact with Patient 09/01/12 0230      Chief Complaint  Patient presents with  . Constipation    (Consider location/radiation/quality/duration/timing/severity/associated sxs/prior treatment) HPI Comments: Bianca Fry 66 y.o. female   The chief complaint is: Patient presents with:   Constipation    Patient presents with chief complaint of abdominal pain.  She has a history of ischemic colitis.  Patient was seen here 3 days ago.  Assessment at that time showed large stool burden.  Patient was given MiraLax and fluids.  She also saw gastroenterology since then.  She has taken bowel prep and magnesium citrate without relief of her constipation.  She states she had one very small bowel movement yesterday.  In several beer a small liquid stools but has not had a real bowel movement.  She states pain is in her lower abdomen.  She also has associated abdominal spasm and tenesmus. Denies blood in her stool. +N/V  Denies fevers, chills, myalgias, arthralgias. Denies DOE, SOB, chest tightness or pressure, radiation to left arm, jaw or back, or diaphoresis. Denies dysuria, flank pain, suprapubic pain, frequency, urgency, or hematuria. Denies headaches, light headedness, weakness, visual disturbances.      The history is provided by the patient and medical records. No language interpreter was used.    Past Medical History  Diagnosis Date  . GERD (gastroesophageal reflux disease)   . Allergy   . Hyperlipidemia   . Arthritis     neck and shoulders, right fingers  . DeQuervain's disease (tenosynovitis) 10/2011    right wrist  . Hypertension     under control; has been on med. since 2009  . Erosive gastritis   . Hiatal hernia   . Internal hemorrhoids   . Colitis   . Anxiety   . Cataract   . Diverticulosis   . Ischemic colitis     Past Surgical History  Procedure Date  . Laparoscopic lysis  intestinal adhesions 1999  . Cholecystectomy 1970  . Appendectomy 1970    at same time as gallbladder  . Abdominal hysterectomy 1985    partial  . Tonsillectomy 1984  . Cataract extraction 2009    right with lens implant  . Lumbar laminectomy/decompression microdiscectomy 11/29/2007; 12/29/2007; 03/15/2008    left L4-5; fusion 5/09 surgery  . Anterior and posterior vaginal repair 12/31/2009    with TVT sling and cysto  . Dorsal compartment release 11/17/2011    Procedure: RELEASE DORSAL COMPARTMENT (DEQUERVAIN);  Surgeon: Wyn Forster., MD;  Location: [facility];  Service: Orthopedics;  Laterality: Right;  First dorsal compartment release  . Colonoscopy 02/16/12    Dr. Claudette Head  . Eyelid surgery 05/12/2012    right    Family History  Problem Relation Age of Onset  . Adopted: Yes  . Colon cancer Neg Hx   . Diabetes Father   . Stroke Father 14  . Heart disease Mother     History  Substance Use Topics  . Smoking status: Never Smoker   . Smokeless tobacco: Never Used  . Alcohol Use: No    OB History    Grav Para Term Preterm Abortions TAB SAB Ect Mult Living                  Review of Systems  All other systems reviewed and are negative.   All other systems  negative except those noted in the HPI Allergies  Codeine; Nitrofurantoin; Percocet; Propoxyphene and methadone; Vicodin; Clarithromycin; Penicillins; Prednisone; and Sulfonamide derivatives  Home Medications   Current Outpatient Rx  Name  Route  Sig  Dispense  Refill  . ACETAMINOPHEN ER 650 MG PO TBCR   Oral   Take 650 mg by mouth every 8 (eight) hours as needed. For pain         . ALPRAZOLAM 0.25 MG PO TABS   Oral   Take 0.25 mg by mouth 3 (three) times daily as needed. For anxiety         . ASPIRIN 81 MG PO TABS   Oral   Take 81 mg by mouth daily.         . ATORVASTATIN CALCIUM 20 MG PO TABS   Oral   Take 20 mg by mouth 2 (two) times a week. 1 tab on Monday and friday           . BIOTIN 10 MG PO TABS   Oral   Take 1 tablet by mouth daily.          Marland Kitchen CIPROFLOXACIN HCL 500 MG PO TABS   Oral   Take 1 tablet (500 mg total) by mouth 2 (two) times daily.   14 tablet   0   . FEXOFENADINE HCL 180 MG PO TABS   Oral   Take 180 mg by mouth daily.           Marland Kitchen HYOSCYAMINE SULFATE 0.125 MG SL SUBL   Sublingual   Place 0.125 mg under the tongue every 4 (four) hours as needed. For stomach cramping         . LISINOPRIL 20 MG PO TABS   Oral   Take 1 tablet (20 mg total) by mouth daily.   30 tablet   3   . MULTIVITAMINS PO TABS   Oral   Take 1 tablet by mouth daily.           Marland Kitchen OMEPRAZOLE 40 MG PO CPDR   Oral   Take 1 capsule (40 mg total) by mouth daily.   30 capsule   11   . ONDANSETRON HCL 8 MG PO TABS   Oral   Take 1 tablet (8 mg total) by mouth 3 (three) times daily as needed for nausea.   90 tablet   2   . PEG-KCL-NACL-NASULF-NA ASC-C 100 G PO SOLR   Oral   Take 1 kit (100 g total) by mouth once.   1 kit   0     Dispense as written.   Marland Kitchen PHENAZOPYRIDINE HCL 200 MG PO TABS   Oral   Take 1 tablet (200 mg total) by mouth 3 (three) times daily.   6 tablet   0   . POLYETHYLENE GLYCOL 3350 PO PACK   Oral   Take 17 g by mouth daily. Take 17 g two to three times a day until having liquid stools, then decrease to once daily   14 each   0     BP 107/61  Pulse 103  Temp 97.7 F (36.5 C) (Oral)  Resp 16  SpO2 95%  Physical Exam  Constitutional: She is oriented to person, place, and time. She appears well-developed and well-nourished. No distress.  HENT:  Head: Normocephalic and atraumatic.  Eyes: Conjunctivae normal are normal. No scleral icterus.  Neck: Normal range of motion.  Cardiovascular: Normal rate, regular rhythm and normal heart sounds.  Exam reveals  no gallop and no friction rub.   No murmur heard. Pulmonary/Chest: Effort normal and breath sounds normal. No respiratory distress.  Abdominal: Soft. She exhibits  distension. There is tenderness in the left upper quadrant and left lower quadrant. There is no rigidity, no rebound, no guarding, no tenderness at McBurney's point and negative Murphy's sign.       Abdomen Soft. Distention in the R/LLQ.  BS auscultated only in the UQs. TTP BL LQs.   No peritoneal signs. No rigidity, guarding.  Neurological: She is alert and oriented to person, place, and time.  Skin: Skin is warm and dry. She is not diaphoretic.    ED Course  Procedures (including critical care time)  Labs Reviewed  CBC WITH DIFFERENTIAL - Abnormal; Notable for the following:    WBC 16.4 (*)     Neutrophils Relative 86 (*)     Neutro Abs 14.0 (*)     Lymphocytes Relative 7 (*)     Monocytes Absolute 1.2 (*)     All other components within normal limits  COMPREHENSIVE METABOLIC PANEL - Abnormal; Notable for the following:    Glucose, Bld 155 (*)     Albumin 3.4 (*)     GFR calc non Af Amer 73 (*)     GFR calc Af Amer 85 (*)     All other components within normal limits  URINALYSIS, ROUTINE W REFLEX MICROSCOPIC   Ct Abdomen Pelvis W Contrast  09/01/2012  *RADIOLOGY REPORT*  Clinical Data: Abdominal pain.  CT ABDOMEN AND PELVIS WITH CONTRAST  Technique:  Multidetector CT imaging of the abdomen and pelvis was performed following the standard protocol during bolus administration of intravenous contrast.  Contrast: OMNIPAQUE IOHEXOL 300 MG/ML  SOLN  Comparison: 08/08/2012  Findings: Bibasilar atelectasis.  No effusions.  Heart is normal size.  Small low-density lesion in the right hepatic lobe, measuring 13 mm, stable, most likely hemangioma.  Spleen, pancreas, adrenals and kidneys are unremarkable.  There is a significant dilatation of the colon which is diffusely fluid-filled. This is a new finding since prior study.  Colon becomes normal caliber in the sigmoid colon. There are sigmoid diverticula.  No definite changes of active diverticulitis.  Small bowel is decompressed.  Trace free  fluid in the pelvis.  No free air or adenopathy.  Prior hysterectomy.  No adnexal masses.  Urinary bladder is unremarkable.  Aorta is normal caliber.  Small hiatal hernia.  No acute bony abnormality.  IMPRESSION: Dilated colon which is fluid filled to the sigmoid colon.  This is of unknown etiology.  Question severe gastroenteritis.  There is sigmoid diverticulosis without CT evidence for active diverticulitis.   Original Report Authenticated By: Charlett Nose, M.D.    Dg Abd Acute W/chest  09/01/2012  *RADIOLOGY REPORT*  Clinical Data: Abdominal pain, vomiting.  ACUTE ABDOMEN SERIES (ABDOMEN 2 VIEW & CHEST 1 VIEW)  Comparison: 08/29/2012.  Chest x-ray 09/15/2011.  Findings: Nonspecific bowel gas pattern.  Gas within mildly prominent right colon.  Air-fluid levels noted in the right side of the colon.  Paucity of gas elsewhere throughout the abdomen.  No free air.  No organomegaly or suspicious calcification. Postoperative changes in the lower lumbar spine.  No acute bony abnormality.  Lungs are clear.  No effusions.  Heart is normal size.  IMPRESSION: Nonspecific bowel gas pattern with mild gaseous distention and air- fluid levels in the right colon.   Original Report Authenticated By: Charlett Nose, M.D.  No diagnosis found.    MDM   Patient white count elevated since last visit.  Plain films show stool burden and right colonic air fluid patterns.  WIll re-CT the abdomen to check for occutl perf.   .patietn with constipation and liquid stool in colon.  I have given dulcolax suppository and molasses enema, with little relief.  Patient feels that she may need to go to the bathroom and would like to wait a lttle while. I have given report ro PA Dahlia Client who has agreed to assume care of the patient.        Arthor Captain, PA-C 09/01/12 [phone]

## 2012-09-05 ENCOUNTER — Ambulatory Visit (INDEPENDENT_AMBULATORY_CARE_PROVIDER_SITE_OTHER): Payer: Medicare Other | Admitting: Cardiovascular Disease

## 2012-09-05 ENCOUNTER — Encounter: Payer: Self-pay | Admitting: Cardiovascular Disease

## 2012-09-05 VITALS — BP 145/84 | HR 90 | Ht 64.0 in | Wt 143.0 lb

## 2012-09-05 DIAGNOSIS — R079 Chest pain, unspecified: Secondary | ICD-10-CM

## 2012-09-05 DIAGNOSIS — I10 Essential (primary) hypertension: Secondary | ICD-10-CM | POA: Diagnosis not present

## 2012-09-05 DIAGNOSIS — R55 Syncope and collapse: Secondary | ICD-10-CM | POA: Insufficient documentation

## 2012-09-05 DIAGNOSIS — E785 Hyperlipidemia, unspecified: Secondary | ICD-10-CM

## 2012-09-05 DIAGNOSIS — R0789 Other chest pain: Secondary | ICD-10-CM | POA: Insufficient documentation

## 2012-09-05 DIAGNOSIS — K559 Vascular disorder of intestine, unspecified: Secondary | ICD-10-CM | POA: Diagnosis not present

## 2012-09-05 NOTE — Assessment & Plan Note (Signed)
Cholesterol is at goal.  Continue current dose of statin and diet Rx.  No myalgias or side effects.  F/U  LFT's in 6 months. Lab Results  Component Value Date   LDLCALC 61 02/19/2012

## 2012-09-05 NOTE — Assessment & Plan Note (Signed)
Well controlled.  Continue current medications and low sodium Dash type diet.    

## 2012-09-05 NOTE — Assessment & Plan Note (Signed)
Frustrated that she has had 5 episodes since January.  Last visit with doctor " he didn't listen to me"  May seek second opinion.  CT in ER 11/14 Dilated colon which is fluid filled to the sigmoid colon. This is of unknown etiology. Question severe gastroenteritis. There is sigmoid diverticulosis without CT evidence for active diverticulitis  No signs of other vascular disease or bruits

## 2012-09-05 NOTE — Patient Instructions (Signed)
Your physician recommends that you schedule a follow-up appointment in:  AS NEEDED Your physician recommends that you continue on your current medications as directed. Please refer to the Current Medication list given to you today.  DR Francoise Schaumann DR [facility]   PHONE NUMBER [phone]

## 2012-09-05 NOTE — Assessment & Plan Note (Signed)
Presyncope related to pain and going to bathroom  Likely vasavagal  Normal ECG, exam and myovue 12/12 no need for further w/u

## 2012-09-05 NOTE — Assessment & Plan Note (Signed)
Atypical muscular normal myovue 12/12  No need for further w/u

## 2012-09-05 NOTE — Progress Notes (Signed)
Patient ID: Bianca Fry, female   DOB: 1946/01/22, 66 y.o.   MRN: [id number] 66 yo with HTN and elevated cholesterol.  Referred by ER  Seen there 11/8 with constipation.  Vasovagal episode while going to bathroom  09/24/11 had normal myovue study with normal EF History of ischemic colitis and sees our GI deparment  She was fairly upset after her last visit and felt the doctor didn't listen to her.  She has had what sounds like two vagal episodes when going to bathroom with pain felt light headed and dizzy and weak.  Resolved in course of minutes Always associated with abdominal pain and going to bathroom  No palpitatoins or seizure activity. Has has atypical shoulder pain with cervical disc problems  As indicated myovue 11 months ago normal  ROS: Denies fever, malais, weight loss, blurry vision, decreased visual acuity, cough, sputum, SOB, hemoptysis, pleuritic pain, palpitaitons, heartburn, abdominal pain, melena, lower extremity edema, claudication, or rash.  All other systems reviewed and negative  General: Affect appropriate Healthy:  appears stated age HEENT: normal Neck supple with no adenopathy JVP normal no bruits no thyromegaly Lungs clear with no wheezing and good diaphragmatic motion Heart:  S1/S2 no murmur, no rub, gallop or click PMI normal Abdomen: benighn, BS positve, no tenderness, no AAA no bruit.  No HSM or HJR Distal pulses intact with no bruits No edema Neuro non-focal Skin warm and dry No muscular weakness   Current Outpatient Prescriptions  Medication Sig Dispense Refill  . ALPRAZolam (XANAX) 0.25 MG tablet Take 0.25 mg by mouth 3 (three) times daily as needed. For anxiety      . aspirin 81 MG tablet Take 81 mg by mouth daily.      Marland Kitchen atorvastatin (LIPITOR) 20 MG tablet Take 20 mg by mouth 2 (two) times a week. 1 tab on Monday and friday      . Biotin 10 MG TABS Take 1 tablet by mouth daily.       . ciprofloxacin (CIPRO) 500 MG tablet Take 1 tablet (500 mg total) by  mouth 2 (two) times daily.  14 tablet  0  . acetaminophen (TYLENOL) 650 MG CR tablet Take 650 mg by mouth every 8 (eight) hours as needed. For pain      . fexofenadine (ALLEGRA) 180 MG tablet Take 180 mg by mouth daily.        Marland Kitchen glycerin adult (GLYCERIN ADULT) 2 G SUPP Place 1 suppository rectally once.  25 suppository  0  . hyoscyamine (LEVSIN SL) 0.125 MG SL tablet Place 0.125 mg under the tongue every 4 (four) hours as needed. For stomach cramping      . lisinopril (PRINIVIL,ZESTRIL) 20 MG tablet Take 1 tablet (20 mg total) by mouth daily.  30 tablet  3  . multivitamin (THERAGRAN) per tablet Take 1 tablet by mouth daily.        Marland Kitchen omeprazole (PRILOSEC) 40 MG capsule Take 1 capsule (40 mg total) by mouth daily.  30 capsule  11  . ondansetron (ZOFRAN) 8 MG tablet Take 1 tablet (8 mg total) by mouth 3 (three) times daily as needed for nausea.  90 tablet  2  . peg 3350 powder (MOVIPREP) 100 G SOLR Take 1 kit (100 g total) by mouth once.  1 kit  0  . phenazopyridine (PYRIDIUM) 200 MG tablet Take 1 tablet (200 mg total) by mouth 3 (three) times daily.  6 tablet  0  . phosphate (FLEET) enema Place 1 enema  rectally once as needed for constipation.  135 mL  0  . polyethylene glycol (MIRALAX / GLYCOLAX) packet Take 17 g by mouth daily. Take 17 g two to three times a day until having liquid stools, then decrease to once daily  14 each  0    Allergies  Codeine; Nitrofurantoin; Percocet; Propoxyphene and methadone; Vicodin; Flagyl; Clarithromycin; Penicillins; Prednisone; and Sulfonamide derivatives  Electrocardiogram:  NSR rate 102 normal otherwise done 09/01/12  Assessment and Plan

## 2012-09-06 ENCOUNTER — Other Ambulatory Visit: Payer: Self-pay | Admitting: Internal Medicine

## 2012-09-09 ENCOUNTER — Encounter: Payer: Self-pay | Admitting: Family

## 2012-09-09 ENCOUNTER — Ambulatory Visit (INDEPENDENT_AMBULATORY_CARE_PROVIDER_SITE_OTHER): Payer: Medicare Other | Admitting: Family

## 2012-09-09 VITALS — BP 128/82 | Temp 97.9°F | Wt 144.0 lb

## 2012-09-09 DIAGNOSIS — F419 Anxiety disorder, unspecified: Secondary | ICD-10-CM

## 2012-09-09 DIAGNOSIS — F341 Dysthymic disorder: Secondary | ICD-10-CM | POA: Diagnosis not present

## 2012-09-09 DIAGNOSIS — F329 Major depressive disorder, single episode, unspecified: Secondary | ICD-10-CM

## 2012-09-09 DIAGNOSIS — R1032 Left lower quadrant pain: Secondary | ICD-10-CM

## 2012-09-09 DIAGNOSIS — F32A Depression, unspecified: Secondary | ICD-10-CM

## 2012-09-09 DIAGNOSIS — R002 Palpitations: Secondary | ICD-10-CM | POA: Diagnosis not present

## 2012-09-09 LAB — TSH: TSH: 1.98 u[IU]/mL (ref 0.35–5.50)

## 2012-09-09 MED ORDER — VENLAFAXINE HCL ER 37.5 MG PO CP24: 37.5000 mg | ORAL_CAPSULE | Freq: Every day | ORAL | Status: DC

## 2012-09-09 NOTE — Progress Notes (Signed)
Subjective:    Patient ID: Bianca Fry, female    DOB: [birth year], 66 y.o.   MRN: [id number]  HPI 66 year old white female, nonsmoker, patient of Dr. Lovell Sheehan and today with complaints of feeling anxious and trembly. She also reports have an pain to her chest wall extended into her left neck and shoulder. She's taken Skelaxin that is helped her symptoms, but not resolved. Patient has become even more anxious over the last several weeks because she's having flares of colitis, unexplained. She seen Dr. Anselm Jungling recently who is performed a colonoscopy and seen her in the office but she continues to have abdominal pain with known other rationale outside of colitis. She is requesting to have a second opinion. Abdominal pain is intermittent, rates it a 5/10, worse with constipation. She's had an increase in constipation recently.   Patient reports feeling more anxious recently. She has a history of anxiety in the past and a shot Paxil made her extremely drowsy. She is willing to try another medication to help calm her nerves and boost her mood. Denies any feelings of helplessness or hopelessness, no thoughts of death or dying.   Review of Systems  Constitutional: Negative.   HENT: Negative.   Respiratory: Negative.  Negative for chest tightness, shortness of breath and wheezing.   Cardiovascular: Positive for chest pain. Negative for palpitations and leg swelling.  Gastrointestinal: Negative.   Musculoskeletal: Negative.   Skin: Negative.   Neurological: Negative.   Hematological: Negative.   Psychiatric/Behavioral: Positive for agitation. Negative for suicidal ideas and self-injury. The patient is nervous/anxious.    Past Medical History  Diagnosis Date  . GERD (gastroesophageal reflux disease)   . Allergy   . Hyperlipidemia   . Arthritis     neck and shoulders, right fingers  . DeQuervain's disease (tenosynovitis) 10/2011    right wrist  . Hypertension     under control; has been on med.  since 2009  . Erosive gastritis   . Hiatal hernia   . Internal hemorrhoids   . Colitis   . Anxiety   . Cataract   . Diverticulosis   . Ischemic colitis     History   Social History  . Marital Status: Married    Spouse Name: N/A    Number of Children: 1  . Years of Education: N/A   Occupational History  . Retired    Social History Main Topics  . Smoking status: Never Smoker   . Smokeless tobacco: Never Used  . Alcohol Use: No  . Drug Use: No  . Sexually Active: Yes   Other Topics Concern  . Not on file   Social History Narrative   Daily caffeine     Past Surgical History  Procedure Date  . Laparoscopic lysis intestinal adhesions 1999  . Cholecystectomy 1970  . Appendectomy 1970    at same time as gallbladder  . Abdominal hysterectomy 1985    partial  . Tonsillectomy 1984  . Cataract extraction 2009    right with lens implant  . Lumbar laminectomy/decompression microdiscectomy 11/29/2007; 12/29/2007; 03/15/2008    left L4-5; fusion 5/09 surgery  . Anterior and posterior vaginal repair 12/31/2009    with TVT sling and cysto  . Dorsal compartment release 11/17/2011    Procedure: RELEASE DORSAL COMPARTMENT (DEQUERVAIN);  Surgeon: Wyn Forster., MD;  Location: [facility];  Service: Orthopedics;  Laterality: Right;  First dorsal compartment release  . Colonoscopy 02/16/12    Dr. Claudette Head  .  Eyelid surgery 05/12/2012    right    Family History  Problem Relation Age of Onset  . Adopted: Yes  . Colon cancer Neg Hx   . Diabetes Father   . Stroke Father 39  . Heart disease Mother     Allergies  Allergen Reactions  . Codeine Other (See Comments)    Chest pain  . Nitrofurantoin Other (See Comments)    Chest pain  . Percocet (Oxycodone-Acetaminophen) Nausea And Vomiting  . Propoxyphene And Methadone Nausea And Vomiting  . Vicodin (Hydrocodone-Acetaminophen) Nausea And Vomiting  . Flagyl (Metronidazole) Diarrhea and Nausea Only  .  Clarithromycin Other (See Comments)    Abd. cramps  . Penicillins Rash  . Prednisone Other (See Comments)    UTI  . Sulfonamide Derivatives Other (See Comments)    GI upset    Current Outpatient Prescriptions on File Prior to Visit  Medication Sig Dispense Refill  . acetaminophen (TYLENOL) 650 MG CR tablet Take 650 mg by mouth every 8 (eight) hours as needed. For pain      . ALPRAZolam (XANAX) 0.25 MG tablet Take 0.25 mg by mouth 3 (three) times daily as needed. For anxiety      . aspirin 81 MG tablet Take 81 mg by mouth daily.      Marland Kitchen atorvastatin (LIPITOR) 20 MG tablet Take 20 mg by mouth 2 (two) times a week. 1 tab on Monday and friday      . Biotin 10 MG TABS Take 1 tablet by mouth daily.       . ciprofloxacin (CIPRO) 500 MG tablet Take 1 tablet (500 mg total) by mouth 2 (two) times daily.  14 tablet  0  . fexofenadine (ALLEGRA) 180 MG tablet Take 180 mg by mouth daily.        Marland Kitchen glycerin adult (GLYCERIN ADULT) 2 G SUPP Place 1 suppository rectally once.  25 suppository  0  . hyoscyamine (LEVSIN SL) 0.125 MG SL tablet Place 0.125 mg under the tongue every 4 (four) hours as needed. For stomach cramping      . lisinopril (PRINIVIL,ZESTRIL) 20 MG tablet Take 1 tablet (20 mg total) by mouth daily.  30 tablet  3  . metaxalone (SKELAXIN) 800 MG tablet TAKE ONE TABLET BY MOUTH THREE TIMES DAILY  90 tablet  0  . multivitamin (THERAGRAN) per tablet Take 1 tablet by mouth daily.        Marland Kitchen omeprazole (PRILOSEC) 40 MG capsule Take 1 capsule (40 mg total) by mouth daily.  30 capsule  11  . ondansetron (ZOFRAN) 8 MG tablet Take 1 tablet (8 mg total) by mouth 3 (three) times daily as needed for nausea.  90 tablet  2  . phenazopyridine (PYRIDIUM) 200 MG tablet Take 1 tablet (200 mg total) by mouth 3 (three) times daily.  6 tablet  0  . phosphate (FLEET) enema Place 1 enema rectally once as needed for constipation.  135 mL  0  . polyethylene glycol (MIRALAX / GLYCOLAX) packet Take 17 g by mouth daily.  Take 17 g two to three times a day until having liquid stools, then decrease to once daily  14 each  0  . venlafaxine XR (EFFEXOR XR) 37.5 MG 24 hr capsule Take 1 capsule (37.5 mg total) by mouth daily.  30 capsule  3    BP 128/82  Temp 97.9 F (36.6 C) (Oral)  Wt 144 lb (65.318 kg)chart    Objective:   Physical Exam  Constitutional: She  is oriented to person, place, and time. She appears well-developed and well-nourished.  HENT:  Right Ear: External ear normal.  Left Ear: External ear normal.  Nose: Nose normal.  Mouth/Throat: Oropharynx is clear and moist.  Neck: Normal range of motion. Neck supple. No thyromegaly present.  Cardiovascular: Normal rate, regular rhythm and normal heart sounds.   Pulmonary/Chest: Effort normal and breath sounds normal.  Abdominal: Soft. Bowel sounds are normal.  Neurological: She is alert and oriented to person, place, and time.  Skin: Skin is warm and dry.  Psychiatric: She has a normal mood and affect.            Assessment & Plan:  Assessment: Anxiety, depression, chest wall pain abdominal pain-left low quadrant  Plan: Continue Skelaxin as needed for chest wall pain. Start Effexor 37.5 mg once daily for anxiety. Referred to GI for second opinion regarding her colitis and abdominal pain. Call the office if symptoms worsen or persist. Recheck in 2-3 weeks.

## 2012-09-09 NOTE — Patient Instructions (Signed)

## 2012-09-12 DIAGNOSIS — D239 Other benign neoplasm of skin, unspecified: Secondary | ICD-10-CM | POA: Diagnosis not present

## 2012-09-12 DIAGNOSIS — L570 Actinic keratosis: Secondary | ICD-10-CM | POA: Diagnosis not present

## 2012-09-12 DIAGNOSIS — Z85828 Personal history of other malignant neoplasm of skin: Secondary | ICD-10-CM | POA: Diagnosis not present

## 2012-09-12 DIAGNOSIS — L821 Other seborrheic keratosis: Secondary | ICD-10-CM | POA: Diagnosis not present

## 2012-09-12 DIAGNOSIS — L819 Disorder of pigmentation, unspecified: Secondary | ICD-10-CM | POA: Diagnosis not present

## 2012-09-12 DIAGNOSIS — D1801 Hemangioma of skin and subcutaneous tissue: Secondary | ICD-10-CM | POA: Diagnosis not present

## 2012-09-24 ENCOUNTER — Other Ambulatory Visit: Payer: Self-pay | Admitting: Internal Medicine

## 2012-09-30 ENCOUNTER — Encounter: Payer: Self-pay | Admitting: Internal Medicine

## 2012-09-30 ENCOUNTER — Ambulatory Visit (INDEPENDENT_AMBULATORY_CARE_PROVIDER_SITE_OTHER): Payer: Medicare Other | Admitting: Internal Medicine

## 2012-09-30 VITALS — BP 140/80 | HR 72 | Temp 98.2°F | Resp 16 | Ht 64.0 in | Wt 143.0 lb

## 2012-09-30 DIAGNOSIS — Z1331 Encounter for screening for depression: Secondary | ICD-10-CM | POA: Diagnosis not present

## 2012-09-30 DIAGNOSIS — F411 Generalized anxiety disorder: Secondary | ICD-10-CM | POA: Diagnosis not present

## 2012-09-30 DIAGNOSIS — I951 Orthostatic hypotension: Secondary | ICD-10-CM

## 2012-09-30 DIAGNOSIS — F419 Anxiety disorder, unspecified: Secondary | ICD-10-CM

## 2012-09-30 DIAGNOSIS — R55 Syncope and collapse: Secondary | ICD-10-CM

## 2012-09-30 DIAGNOSIS — K589 Irritable bowel syndrome without diarrhea: Secondary | ICD-10-CM | POA: Diagnosis not present

## 2012-09-30 DIAGNOSIS — Z9181 History of falling: Secondary | ICD-10-CM

## 2012-09-30 MED ORDER — ALPRAZOLAM 0.25 MG PO TABS: 0.2500 mg | ORAL_TABLET | Freq: Three times a day (TID) | ORAL | Status: DC | PRN

## 2012-09-30 MED ORDER — ATORVASTATIN CALCIUM 20 MG PO TABS: 20.0000 mg | ORAL_TABLET | ORAL | Status: DC

## 2012-09-30 MED ORDER — CILIDINIUM-CHLORDIAZEPOXIDE 2.5-5 MG PO CAPS: 1.0000 | ORAL_CAPSULE | Freq: Three times a day (TID) | ORAL | Status: DC | PRN

## 2012-09-30 NOTE — Progress Notes (Signed)
Subjective:    Patient ID: Bianca Fry, female    DOB: Sep 17, 1946, 66 y.o.   MRN: [id number]  HPI  The patient has had multiple GI episodes and had syncope after episodes of nausea and vomiting And the GI doctor reportedly said " passing out is not my problem" but the episodes were post vomiting ( vasovagal syncope) Anxiety and medication side effects from the Prinivil Also stopped the antidepressant Still has constipation and GI upset ( IBS?) dicussion of diet and seeds     Review of Systems  Constitutional: Negative for activity change, appetite change and fatigue.  HENT: Negative for ear pain, congestion, neck pain, postnasal drip and sinus pressure.   Eyes: Negative for redness and visual disturbance.  Respiratory: Negative for cough, shortness of breath and wheezing.   Gastrointestinal: Negative for abdominal pain and abdominal distention.  Genitourinary: Negative for dysuria, frequency and menstrual problem.  Musculoskeletal: Negative for myalgias, joint swelling and arthralgias.  Skin: Negative for rash and wound.  Neurological: Negative for dizziness, weakness and headaches.  Hematological: Negative for adenopathy. Does not bruise/bleed easily.  Psychiatric/Behavioral: Negative for sleep disturbance and decreased concentration.   Past Medical History  Diagnosis Date  . GERD (gastroesophageal reflux disease)   . Allergy   . Hyperlipidemia   . Arthritis     neck and shoulders, right fingers  . DeQuervain's disease (tenosynovitis) 10/2011    right wrist  . Hypertension     under control; has been on med. since 2009  . Erosive gastritis   . Hiatal hernia   . Internal hemorrhoids   . Colitis   . Anxiety   . Cataract   . Diverticulosis   . Ischemic colitis     History   Social History  . Marital Status: Married    Spouse Name: N/A    Number of Children: 1  . Years of Education: N/A   Occupational History  . Retired    Social History Main Topics  .  Smoking status: Never Smoker   . Smokeless tobacco: Never Used  . Alcohol Use: No  . Drug Use: No  . Sexually Active: Yes   Other Topics Concern  . Not on file   Social History Narrative   Daily caffeine     Past Surgical History  Procedure Date  . Laparoscopic lysis intestinal adhesions 1999  . Cholecystectomy 1970  . Appendectomy 1970    at same time as gallbladder  . Abdominal hysterectomy 1985    partial  . Tonsillectomy 1984  . Cataract extraction 2009    right with lens implant  . Lumbar laminectomy/decompression microdiscectomy 11/29/2007; 12/29/2007; 03/15/2008    left L4-5; fusion 5/09 surgery  . Anterior and posterior vaginal repair 12/31/2009    with TVT sling and cysto  . Dorsal compartment release 11/17/2011    Procedure: RELEASE DORSAL COMPARTMENT (DEQUERVAIN);  Surgeon: Wyn Forster., MD;  Location: [facility];  Service: Orthopedics;  Laterality: Right;  First dorsal compartment release  . Colonoscopy 02/16/12    Dr. Claudette Head  . Eyelid surgery 05/12/2012    right    Family History  Problem Relation Age of Onset  . Adopted: Yes  . Colon cancer Neg Hx   . Diabetes Father   . Stroke Father 40  . Heart disease Mother     Allergies  Allergen Reactions  . Codeine Other (See Comments)    Chest pain  . Nitrofurantoin Other (See Comments)  Chest pain  . Percocet (Oxycodone-Acetaminophen) Nausea And Vomiting  . Propoxyphene And Methadone Nausea And Vomiting  . Vicodin (Hydrocodone-Acetaminophen) Nausea And Vomiting  . Flagyl (Metronidazole) Diarrhea and Nausea Only  . Clarithromycin Other (See Comments)    Abd. cramps  . Penicillins Rash  . Prednisone Other (See Comments)    UTI  . Sulfonamide Derivatives Other (See Comments)    GI upset    Current Outpatient Prescriptions on File Prior to Visit  Medication Sig Dispense Refill  . acetaminophen (TYLENOL) 650 MG CR tablet Take 650 mg by mouth every 8 (eight) hours as needed.  For pain      . ALPRAZolam (XANAX) 0.25 MG tablet Take 0.25 mg by mouth 3 (three) times daily as needed. For anxiety      . aspirin 81 MG tablet Take 81 mg by mouth daily.      Marland Kitchen atorvastatin (LIPITOR) 20 MG tablet Take 20 mg by mouth 2 (two) times a week. 1 tab on Monday and friday      . Biotin 10 MG TABS Take 1 tablet by mouth daily.       . fexofenadine (ALLEGRA) 180 MG tablet Take 180 mg by mouth daily.        . hyoscyamine (LEVSIN SL) 0.125 MG SL tablet Place 0.125 mg under the tongue every 4 (four) hours as needed. For stomach cramping      . lisinopril (PRINIVIL,ZESTRIL) 20 MG tablet Take 1 tablet (20 mg total) by mouth daily.  30 tablet  3  . metaxalone (SKELAXIN) 800 MG tablet TAKE ONE TABLET BY MOUTH THREE TIMES DAILY  90 tablet  0  . multivitamin (THERAGRAN) per tablet Take 1 tablet by mouth daily.        Marland Kitchen omeprazole (PRILOSEC) 40 MG capsule TAKE ONE CAPSULE BY MOUTH EVERY DAY  30 capsule  10  . venlafaxine XR (EFFEXOR XR) 37.5 MG 24 hr capsule Take 1 capsule (37.5 mg total) by mouth daily.  30 capsule  3    BP 140/80  Pulse 72  Temp 98.2 F (36.8 C)  Resp 16  Ht 5\' 4"  (1.626 m)  Wt 143 lb (64.864 kg)  BMI 24.55 kg/m2  '     Objective:   Physical Exam  Nursing note and vitals reviewed. Constitutional: She is oriented to person, place, and time. She appears well-developed and well-nourished. No distress.  HENT:  Head: Normocephalic and atraumatic.  Right Ear: External ear normal.  Left Ear: External ear normal.  Nose: Nose normal.  Mouth/Throat: Oropharynx is clear and moist.  Eyes: Conjunctivae normal and EOM are normal. Pupils are equal, round, and reactive to light.  Neck: Normal range of motion. Neck supple. No JVD present. No tracheal deviation present. No thyromegaly present.  Cardiovascular: Normal rate, regular rhythm, normal heart sounds and intact distal pulses.   No murmur heard. Pulmonary/Chest: Effort normal and breath sounds normal. She has no  wheezes. She exhibits no tenderness.  Abdominal: Soft. Bowel sounds are normal.  Musculoskeletal: Normal range of motion. She exhibits no edema and no tenderness.  Lymphadenopathy:    She has no cervical adenopathy.  Neurological: She is alert and oriented to person, place, and time. She has normal reflexes. No cranial nerve deficit.  Skin: Skin is warm and dry. She is not diaphoretic.  Psychiatric: She has a normal mood and affect. Her behavior is normal.          Assessment & Plan:  Discussion of IBS and  her presentation  I have spent more than 30 minutes examining this patient face-to-face of which over half was spent in counseling ?gluten Trial of librax Discussion vasovagal

## 2012-09-30 NOTE — Patient Instructions (Addendum)
Irritable Bowel Syndrome  Irritable Bowel Syndrome (IBS) is caused by a disturbance of normal bowel function. Other terms used are spastic colon, mucous colitis, and irritable colon. It does not require surgery, nor does it lead to cancer. There is no cure for IBS. But with proper diet, stress reduction, and medication, you will find that your problems (symptoms) will gradually disappear or improve. IBS is a common digestive disorder. It usually appears in late adolescence or early adulthood. Women develop it twice as often as men.  CAUSES   After food has been digested and absorbed in the small intestine, waste material is moved into the colon (large intestine). In the colon, water and salts are absorbed from the undigested products coming from the small intestine. The remaining residue, or fecal material, is held for elimination. Under normal circumstances, gentle, rhythmic contractions on the bowel walls push the fecal material along the colon towards the rectum. In IBS, however, these contractions are irregular and poorly coordinated. The fecal material is either retained too long, resulting in constipation, or expelled too soon, producing diarrhea.  SYMPTOMS   The most common symptom of IBS is pain. It is typically in the lower left side of the belly (abdomen). But it may occur anywhere in the abdomen. It can be felt as heartburn, backache, or even as a dull pain in the arms or shoulders. The pain comes from excessive bowel-muscle spasms and from the buildup of gas and fecal material in the colon. This pain:   Can range from sharp belly (abdominal) cramps to a dull, continuous ache.   Usually worsens soon after eating.   Is typically relieved by having a bowel movement or passing gas.  Abdominal pain is usually accompanied by constipation. But it may also produce diarrhea. The diarrhea typically occurs right after a meal or upon arising in the morning. The stools are typically soft and watery. They are often  flecked with secretions (mucus).  Other symptoms of IBS include:   Bloating.   Loss of appetite.   Heartburn.   Feeling sick to your stomach (nausea).   Belching   Vomiting   Gas.  IBS may also cause a number of symptoms that are unrelated to the digestive system:   Fatigue.   Headaches.   Anxiety   Shortness of breath   Difficulty in concentrating.   Dizziness.  These symptoms tend to come and go.  DIAGNOSIS   The symptoms of IBS closely mimic the symptoms of other, more serious digestive disorders. So your caregiver may wish to perform a variety of additional tests to exclude these disorders. He/she wants to be certain of learning what is wrong (diagnosis). The nature and purpose of each test will be explained to you.  TREATMENT  A number of medications are available to help correct bowel function and/or relieve bowel spasms and abdominal pain. Among the drugs available are:   Mild, non-irritating laxatives for severe constipation and to help restore normal bowel habits.   Specific anti-diarrheal medications to treat severe or prolonged diarrhea.   Anti-spasmodic agents to relieve intestinal cramps.   Your caregiver may also decide to treat you with a mild tranquilizer or sedative during unusually stressful periods in your life.  The important thing to remember is that if any drug is prescribed for you, make sure that you take it exactly as directed. Make sure that your caregiver knows how well it worked for you.  HOME CARE INSTRUCTIONS    Avoid foods that   are high in fat or oils. Some examples are:heavy cream, butter, frankfurters, sausage, and other fatty meats.   Avoid foods that have a laxative effect, such as fruit, fruit juice, and dairy products.   Cut out carbonated drinks, chewing gum, and "gassy" foods, such as beans and cabbage. This may help relieve bloating and belching.   Bran taken with plenty of liquids may help relieve constipation.   Keep track of what foods seem to trigger  your symptoms.   Avoid emotionally charged situations or circumstances that produce anxiety.   Start or continue exercising.   Get plenty of rest and sleep.  MAKE SURE YOU:    Understand these instructions.   Will watch your condition.   Will get help right away if you are not doing well or get worse.  Document Released: 10/05/2005 Document Revised: 12/28/2011 Document Reviewed: 05/25/2008  [employer] Patient Information 2013 [employer].

## 2012-10-26 DIAGNOSIS — H2510 Age-related nuclear cataract, unspecified eye: Secondary | ICD-10-CM | POA: Diagnosis not present

## 2012-12-05 ENCOUNTER — Encounter: Payer: Self-pay | Admitting: Internal Medicine

## 2012-12-05 ENCOUNTER — Telehealth: Payer: Self-pay | Admitting: *Deleted

## 2012-12-05 ENCOUNTER — Ambulatory Visit (INDEPENDENT_AMBULATORY_CARE_PROVIDER_SITE_OTHER): Payer: Medicare Other | Admitting: Internal Medicine

## 2012-12-05 VITALS — BP 140/88 | HR 90 | Temp 98.1°F | Resp 16 | Ht 64.0 in | Wt 142.0 lb

## 2012-12-05 DIAGNOSIS — R109 Unspecified abdominal pain: Secondary | ICD-10-CM

## 2012-12-05 DIAGNOSIS — K589 Irritable bowel syndrome without diarrhea: Secondary | ICD-10-CM

## 2012-12-05 DIAGNOSIS — I10 Essential (primary) hypertension: Secondary | ICD-10-CM | POA: Diagnosis not present

## 2012-12-05 MED ORDER — BISOPROLOL FUMARATE 5 MG PO TABS: 2.5000 mg | ORAL_TABLET | Freq: Every day | ORAL | Status: DC

## 2012-12-05 MED ORDER — BISOPROLOL-HYDROCHLOROTHIAZIDE 2.5-6.25 MG PO TABS: 1.0000 | ORAL_TABLET | Freq: Every day | ORAL | Status: DC

## 2012-12-05 NOTE — Telephone Encounter (Signed)
Opened in error

## 2012-12-05 NOTE — Progress Notes (Signed)
Subjective:    Patient ID: Bianca Fry, female    DOB: Mar 22, 1946, 67 y.o.   MRN: [id number]  HPI  Significant improvement in GI pain and cramping with the librax and has not had a flare of the colitis Elevated blood pressure still noted with decreased pain  Review of Systems  Constitutional: Negative for activity change, appetite change and fatigue.  HENT: Negative for ear pain, congestion, neck pain, postnasal drip and sinus pressure.   Eyes: Negative for redness and visual disturbance.  Respiratory: Negative for cough, shortness of breath and wheezing.   Gastrointestinal: Negative for abdominal pain and abdominal distention.  Genitourinary: Negative for dysuria, frequency and menstrual problem.  Musculoskeletal: Negative for myalgias, joint swelling and arthralgias.  Skin: Negative for rash and wound.  Neurological: Negative for dizziness, weakness and headaches.  Hematological: Negative for adenopathy. Does not bruise/bleed easily.  Psychiatric/Behavioral: Negative for sleep disturbance and decreased concentration.   Past Medical History  Diagnosis Date  . GERD (gastroesophageal reflux disease)   . Allergy   . Hyperlipidemia   . Arthritis     neck and shoulders, right fingers  . DeQuervain's disease (tenosynovitis) 10/2011    right wrist  . Hypertension     under control; has been on med. since 2009  . Erosive gastritis   . Hiatal hernia   . Internal hemorrhoids   . Colitis   . Anxiety   . Cataract   . Diverticulosis   . Ischemic colitis     History   Social History  . Marital Status: Married    Spouse Name: N/A    Number of Children: 1  . Years of Education: N/A   Occupational History  . Retired    Social History Main Topics  . Smoking status: Never Smoker   . Smokeless tobacco: Never Used  . Alcohol Use: No  . Drug Use: No  . Sexually Active: Yes   Other Topics Concern  . Not on file   Social History Narrative   Daily caffeine     Past  Surgical History  Procedure Laterality Date  . Laparoscopic lysis intestinal adhesions  1999  . Cholecystectomy  1970  . Appendectomy  1970    at same time as gallbladder  . Abdominal hysterectomy  1985    partial  . Tonsillectomy  1984  . Cataract extraction  2009    right with lens implant  . Lumbar laminectomy/decompression microdiscectomy  11/29/2007; 12/29/2007; 03/15/2008    left L4-5; fusion 5/09 surgery  . Anterior and posterior vaginal repair  12/31/2009    with TVT sling and cysto  . Dorsal compartment release  11/17/2011    Procedure: RELEASE DORSAL COMPARTMENT (DEQUERVAIN);  Surgeon: Wyn Forster., MD;  Location: [facility];  Service: Orthopedics;  Laterality: Right;  First dorsal compartment release  . Colonoscopy  02/16/12    Dr. Claudette Head  . Eyelid surgery  05/12/2012    right    Family History  Problem Relation Age of Onset  . Adopted: Yes  . Colon cancer Neg Hx   . Diabetes Father   . Stroke Father 3  . Heart disease Mother     Allergies  Allergen Reactions  . Codeine Other (See Comments)    Chest pain  . Nitrofurantoin Other (See Comments)    Chest pain  . Percocet (Oxycodone-Acetaminophen) Nausea And Vomiting  . Propoxyphene And Methadone Nausea And Vomiting  . Vicodin (Hydrocodone-Acetaminophen) Nausea And Vomiting  .  Flagyl (Metronidazole) Diarrhea and Nausea Only  . Clarithromycin Other (See Comments)    Abd. cramps  . Penicillins Rash  . Prednisone Other (See Comments)    UTI  . Sulfonamide Derivatives Other (See Comments)    GI upset    Current Outpatient Prescriptions on File Prior to Visit  Medication Sig Dispense Refill  . acetaminophen (TYLENOL) 650 MG CR tablet Take 650 mg by mouth every 8 (eight) hours as needed. For pain      . ALPRAZolam (XANAX) 0.25 MG tablet Take 1 tablet (0.25 mg total) by mouth 3 (three) times daily as needed. For anxiety  30 tablet  5  . aspirin 81 MG tablet Take 81 mg by mouth daily.       Marland Kitchen atorvastatin (LIPITOR) 20 MG tablet Take 1 tablet (20 mg total) by mouth 2 (two) times a week. 1 tab on Monday and friday  30 tablet  11  . Biotin 10 MG TABS Take 1 tablet by mouth daily.       . clidinium-chlordiazePOXIDE (LIBRAX) 2.5-5 MG per capsule Take 1 capsule by mouth 3 (three) times daily as needed.  60 capsule  3  . fexofenadine (ALLEGRA) 180 MG tablet Take 180 mg by mouth daily.        . hyoscyamine (LEVSIN SL) 0.125 MG SL tablet Place 0.125 mg under the tongue every 4 (four) hours as needed. For stomach cramping      . metaxalone (SKELAXIN) 800 MG tablet TAKE ONE TABLET BY MOUTH THREE TIMES DAILY  90 tablet  0  . omeprazole (PRILOSEC) 40 MG capsule TAKE ONE CAPSULE BY MOUTH EVERY DAY  30 capsule  10  . multivitamin (THERAGRAN) per tablet Take 1 tablet by mouth daily.         No current facility-administered medications on file prior to visit.    BP 140/88  Pulse 90  Temp(Src) 98.1 F (36.7 C)  Resp 16  Ht 5\' 4"  (1.626 m)  Wt 142 lb (64.411 kg)  BMI 24.36 kg/m2       Objective:   Physical Exam  Nursing note and vitals reviewed. Constitutional: She is oriented to person, place, and time. She appears well-developed and well-nourished. No distress.  HENT:  Head: Normocephalic and atraumatic.  Right Ear: External ear normal.  Left Ear: External ear normal.  Nose: Nose normal.  Mouth/Throat: Oropharynx is clear and moist.  Eyes: Conjunctivae and EOM are normal. Pupils are equal, round, and reactive to light.  Neck: Normal range of motion. Neck supple. No JVD present. No tracheal deviation present. No thyromegaly present.  Cardiovascular: Normal rate, regular rhythm, normal heart sounds and intact distal pulses.   No murmur heard. Pulmonary/Chest: Effort normal and breath sounds normal. She has no wheezes. She exhibits no tenderness.  Abdominal: Soft. Bowel sounds are normal.  Musculoskeletal: Normal range of motion. She exhibits no edema and no tenderness.   Lymphadenopathy:    She has no cervical adenopathy.  Neurological: She is alert and oriented to person, place, and time. She has normal reflexes. No cranial nerve deficit.  Skin: Skin is warm and dry. She is not diaphoretic.  Psychiatric: She has a normal mood and affect. Her behavior is normal.          Assessment & Plan:  IBS and abdominal pain better No syncope Blood pressure elevation noted and resume BB for HTN and  Anxiety Diet for IBs reveiwed

## 2012-12-05 NOTE — Patient Instructions (Addendum)
The patient is instructed to continue all medications as prescribed. Schedule followup with check out clerk upon leaving the clinic  

## 2013-02-09 DIAGNOSIS — Z85828 Personal history of other malignant neoplasm of skin: Secondary | ICD-10-CM | POA: Diagnosis not present

## 2013-02-09 DIAGNOSIS — L570 Actinic keratosis: Secondary | ICD-10-CM | POA: Diagnosis not present

## 2013-02-09 DIAGNOSIS — L819 Disorder of pigmentation, unspecified: Secondary | ICD-10-CM | POA: Diagnosis not present

## 2013-02-22 ENCOUNTER — Encounter: Payer: Self-pay | Admitting: Family

## 2013-02-22 ENCOUNTER — Ambulatory Visit (INDEPENDENT_AMBULATORY_CARE_PROVIDER_SITE_OTHER): Payer: Medicare Other | Admitting: Family

## 2013-02-22 VITALS — BP 134/70 | HR 71 | Wt 144.0 lb

## 2013-02-22 DIAGNOSIS — S161XXD Strain of muscle, fascia and tendon at neck level, subsequent encounter: Secondary | ICD-10-CM

## 2013-02-22 DIAGNOSIS — M542 Cervicalgia: Secondary | ICD-10-CM | POA: Diagnosis not present

## 2013-02-22 DIAGNOSIS — Z5189 Encounter for other specified aftercare: Secondary | ICD-10-CM | POA: Diagnosis not present

## 2013-02-22 MED ORDER — CYCLOBENZAPRINE HCL 5 MG PO TABS: 5.0000 mg | ORAL_TABLET | Freq: Three times a day (TID) | ORAL | Status: DC | PRN

## 2013-02-22 MED ORDER — DICLOFENAC SODIUM 75 MG PO TBEC: 75.0000 mg | DELAYED_RELEASE_TABLET | Freq: Two times a day (BID) | ORAL | Status: DC

## 2013-02-22 NOTE — Patient Instructions (Signed)

## 2013-02-23 ENCOUNTER — Encounter: Payer: Self-pay | Admitting: Family

## 2013-02-23 NOTE — Progress Notes (Signed)
Subjective:    Patient ID: Bianca Fry, female    DOB: 1946-05-31, 67 y.o.   MRN: [id number]  HPI 67 year old white female, nonsmoker, patient of Dr. Lovell Sheehan is in today with complaints of neck pain x3 days. The pain is worse with movement. She rates it a 10 out of 10. Has taken Tylenol that is helped a very little bit. The pain does not radiate. Reports working in her yard on Sunday and believes that that may have contributed to the tension in her neck. She's been applying heat but has not been very effective.   Review of Systems  Constitutional: Negative.   HENT: Positive for neck pain and neck stiffness. Negative for congestion, sore throat, sneezing and postnasal drip.   Respiratory: Negative.   Cardiovascular: Negative.   Musculoskeletal: Positive for myalgias.       Neck pain  Neurological: Negative.   Psychiatric/Behavioral: Negative.    Past Medical History  Diagnosis Date  . GERD (gastroesophageal reflux disease)   . Allergy   . Hyperlipidemia   . Arthritis     neck and shoulders, right fingers  . DeQuervain's disease (tenosynovitis) 10/2011    right wrist  . Hypertension     under control; has been on med. since 2009  . Erosive gastritis   . Hiatal hernia   . Internal hemorrhoids   . Colitis   . Anxiety   . Cataract   . Diverticulosis   . Ischemic colitis     History   Social History  . Marital Status: Married    Spouse Name: N/A    Number of Children: 1  . Years of Education: N/A   Occupational History  . Retired    Social History Main Topics  . Smoking status: Never Smoker   . Smokeless tobacco: Never Used  . Alcohol Use: No  . Drug Use: No  . Sexually Active: Yes   Other Topics Concern  . Not on file   Social History Narrative   Daily caffeine     Past Surgical History  Procedure Laterality Date  . Laparoscopic lysis intestinal adhesions  1999  . Cholecystectomy  1970  . Appendectomy  1970    at same time as gallbladder  . Abdominal  hysterectomy  1985    partial  . Tonsillectomy  1984  . Cataract extraction  2009    right with lens implant  . Lumbar laminectomy/decompression microdiscectomy  11/29/2007; 12/29/2007; 03/15/2008    left L4-5; fusion 5/09 surgery  . Anterior and posterior vaginal repair  12/31/2009    with TVT sling and cysto  . Dorsal compartment release  11/17/2011    Procedure: RELEASE DORSAL COMPARTMENT (DEQUERVAIN);  Surgeon: Wyn Forster., MD;  Location: [facility];  Service: Orthopedics;  Laterality: Right;  First dorsal compartment release  . Colonoscopy  02/16/12    Dr. Claudette Head  . Eyelid surgery  05/12/2012    right    Family History  Problem Relation Age of Onset  . Adopted: Yes  . Colon cancer Neg Hx   . Diabetes Father   . Stroke Father 81  . Heart disease Mother     Allergies  Allergen Reactions  . Codeine Other (See Comments)    Chest pain  . Nitrofurantoin Other (See Comments)    Chest pain  . Percocet (Oxycodone-Acetaminophen) Nausea And Vomiting  . Propoxyphene And Methadone Nausea And Vomiting  . Vicodin (Hydrocodone-Acetaminophen) Nausea And Vomiting  . Flagyl (Metronidazole)  Diarrhea and Nausea Only  . Clarithromycin Other (See Comments)    Abd. cramps  . Penicillins Rash  . Prednisone Other (See Comments)    UTI  . Sulfonamide Derivatives Other (See Comments)    GI upset    Current Outpatient Prescriptions on File Prior to Visit  Medication Sig Dispense Refill  . acetaminophen (TYLENOL) 650 MG CR tablet Take 650 mg by mouth every 8 (eight) hours as needed. For pain      . ALPRAZolam (XANAX) 0.25 MG tablet Take 1 tablet (0.25 mg total) by mouth 3 (three) times daily as needed. For anxiety  30 tablet  5  . aspirin 81 MG tablet Take 81 mg by mouth daily.      Marland Kitchen atorvastatin (LIPITOR) 20 MG tablet Take 1 tablet (20 mg total) by mouth 2 (two) times a week. 1 tab on Monday and friday  30 tablet  11  . Biotin 10 MG TABS Take 1 tablet by mouth  daily.       . bisoprolol (ZEBETA) 5 MG tablet Take 0.5 tablets (2.5 mg total) by mouth daily.  30 tablet  6  . clidinium-chlordiazePOXIDE (LIBRAX) 2.5-5 MG per capsule Take 1 capsule by mouth 3 (three) times daily as needed.  60 capsule  3  . fexofenadine (ALLEGRA) 180 MG tablet Take 180 mg by mouth daily.        . hyoscyamine (LEVSIN SL) 0.125 MG SL tablet Place 0.125 mg under the tongue every 4 (four) hours as needed. For stomach cramping      . metaxalone (SKELAXIN) 800 MG tablet TAKE ONE TABLET BY MOUTH THREE TIMES DAILY  90 tablet  0  . multivitamin (THERAGRAN) per tablet Take 1 tablet by mouth daily.        Marland Kitchen omeprazole (PRILOSEC) 40 MG capsule TAKE ONE CAPSULE BY MOUTH EVERY DAY  30 capsule  10   No current facility-administered medications on file prior to visit.    BP 134/70  Pulse 71  Wt 144 lb (65.318 kg)  BMI 24.71 kg/m2  SpO2 97%chart    Objective:   Physical Exam  Constitutional: She is oriented to person, place, and time. She appears well-developed and well-nourished.  Neck: Neck supple.  Pain with flexion of the neck. No pain with rotation. No tenderness to palpation of the cervical vertebrae.  Cardiovascular: Normal rate, regular rhythm and normal heart sounds.   Pulmonary/Chest: Effort normal and breath sounds normal.  Neurological: She is alert and oriented to person, place, and time.  Skin: Skin is warm and dry.  Psychiatric: She has a normal mood and affect.          Assessment & Plan:  Assessment:  1. Neck pain 2. Cervical strain  Plan: Flexeril 5 mg one tablet 3 times a day. Diclofenac 75 mg one tablet twice a day with food. Ice and heat and to the affected area. Patient call the office if symptoms worsen or persist. Recheck a schedule, and as needed.

## 2013-03-14 ENCOUNTER — Emergency Department (HOSPITAL_COMMUNITY): Payer: Medicare Other

## 2013-03-14 ENCOUNTER — Encounter (HOSPITAL_COMMUNITY): Payer: Self-pay | Admitting: Emergency Medicine

## 2013-03-14 ENCOUNTER — Emergency Department (HOSPITAL_COMMUNITY)
Admission: EM | Admit: 2013-03-14 | Discharge: 2013-03-14 | Disposition: A | Discharge status: Home / Self Care | Payer: Medicare Other | Attending: Emergency Medicine | Admitting: Emergency Medicine

## 2013-03-14 DIAGNOSIS — E785 Hyperlipidemia, unspecified: Secondary | ICD-10-CM | POA: Insufficient documentation

## 2013-03-14 DIAGNOSIS — Z8739 Personal history of other diseases of the musculoskeletal system and connective tissue: Secondary | ICD-10-CM | POA: Diagnosis not present

## 2013-03-14 DIAGNOSIS — F411 Generalized anxiety disorder: Secondary | ICD-10-CM | POA: Diagnosis not present

## 2013-03-14 DIAGNOSIS — M129 Arthropathy, unspecified: Secondary | ICD-10-CM | POA: Diagnosis not present

## 2013-03-14 DIAGNOSIS — K219 Gastro-esophageal reflux disease without esophagitis: Secondary | ICD-10-CM | POA: Diagnosis not present

## 2013-03-14 DIAGNOSIS — M549 Dorsalgia, unspecified: Secondary | ICD-10-CM

## 2013-03-14 DIAGNOSIS — Z981 Arthrodesis status: Secondary | ICD-10-CM | POA: Insufficient documentation

## 2013-03-14 DIAGNOSIS — Z8669 Personal history of other diseases of the nervous system and sense organs: Secondary | ICD-10-CM | POA: Insufficient documentation

## 2013-03-14 DIAGNOSIS — M545 Low back pain, unspecified: Secondary | ICD-10-CM | POA: Diagnosis not present

## 2013-03-14 DIAGNOSIS — Z880 Allergy status to penicillin: Secondary | ICD-10-CM | POA: Insufficient documentation

## 2013-03-14 DIAGNOSIS — Z9849 Cataract extraction status, unspecified eye: Secondary | ICD-10-CM | POA: Diagnosis not present

## 2013-03-14 DIAGNOSIS — Z8679 Personal history of other diseases of the circulatory system: Secondary | ICD-10-CM | POA: Diagnosis not present

## 2013-03-14 DIAGNOSIS — M546 Pain in thoracic spine: Secondary | ICD-10-CM | POA: Diagnosis not present

## 2013-03-14 DIAGNOSIS — S298XXA Other specified injuries of thorax, initial encounter: Secondary | ICD-10-CM | POA: Diagnosis not present

## 2013-03-14 DIAGNOSIS — IMO0002 Reserved for concepts with insufficient information to code with codable children: Secondary | ICD-10-CM | POA: Diagnosis not present

## 2013-03-14 DIAGNOSIS — Z7982 Long term (current) use of aspirin: Secondary | ICD-10-CM | POA: Diagnosis not present

## 2013-03-14 DIAGNOSIS — I10 Essential (primary) hypertension: Secondary | ICD-10-CM | POA: Diagnosis not present

## 2013-03-14 DIAGNOSIS — Z79899 Other long term (current) drug therapy: Secondary | ICD-10-CM | POA: Insufficient documentation

## 2013-03-14 DIAGNOSIS — Z961 Presence of intraocular lens: Secondary | ICD-10-CM | POA: Diagnosis not present

## 2013-03-14 DIAGNOSIS — Z8719 Personal history of other diseases of the digestive system: Secondary | ICD-10-CM | POA: Insufficient documentation

## 2013-03-14 DIAGNOSIS — R0789 Other chest pain: Secondary | ICD-10-CM | POA: Diagnosis not present

## 2013-03-14 NOTE — ED Notes (Signed)
PT. REPORTS RIGHT BACK PAIN ONSET Thursday WORSE WITH MOVEMENT / CERTAIN POSITIONS WORSE THIS MORNING , PT. STATED SHE TRIPPED AND FELL AT HER [facility] LAST Wednesday ,AMBULATORY / RESPIRATIONS UNLABORED.

## 2013-03-14 NOTE — ED Provider Notes (Signed)
History     CSN: [id number]  Arrival date & time 03/14/13  0240   First MD Initiated Contact with Patient 03/14/13 [phone]      Chief Complaint  Patient presents with  . Back Pain    (Consider location/radiation/quality/duration/timing/severity/associated sxs/prior treatment) HPI HX per TP - fell on her L side a few days ago, the next day developed R sided mid back pain, shooting and sharp worse with movement. She had been given RX by her PCP for neck pain recently and tried that, not getting better tonight, presents here for evaluation. No weakness or numbness, has h/o lumbar fusion but denies any chronic back pains. No incontinence, does not tolerate any narcotic pain medications, takes benzos and muscle relaxers regularly. Took tylenol PT and feeling somewhat better.   Past Medical History  Diagnosis Date  . GERD (gastroesophageal reflux disease)   . Allergy   . Hyperlipidemia   . Arthritis     neck and shoulders, right fingers  . DeQuervain's disease (tenosynovitis) 10/2011    right wrist  . Hypertension     under control; has been on med. since 2009  . Erosive gastritis   . Hiatal hernia   . Internal hemorrhoids   . Colitis   . Anxiety   . Cataract   . Diverticulosis   . Ischemic colitis     Past Surgical History  Procedure Laterality Date  . Laparoscopic lysis intestinal adhesions  1999  . Cholecystectomy  1970  . Appendectomy  1970    at same time as gallbladder  . Abdominal hysterectomy  1985    partial  . Tonsillectomy  1984  . Cataract extraction  2009    right with lens implant  . Lumbar laminectomy/decompression microdiscectomy  11/29/2007; 12/29/2007; 03/15/2008    left L4-5; fusion 5/09 surgery  . Anterior and posterior vaginal repair  12/31/2009    with TVT sling and cysto  . Dorsal compartment release  11/17/2011    Procedure: RELEASE DORSAL COMPARTMENT (DEQUERVAIN);  Surgeon: Wyn Forster., MD;  Location: [facility];  Service:  Orthopedics;  Laterality: Right;  First dorsal compartment release  . Colonoscopy  02/16/12    Dr. Claudette Head  . Eyelid surgery  05/12/2012    right    Family History  Problem Relation Age of Onset  . Adopted: Yes  . Colon cancer Neg Hx   . Diabetes Father   . Stroke Father 47  . Heart disease Mother     History  Substance Use Topics  . Smoking status: Never Smoker   . Smokeless tobacco: Never Used  . Alcohol Use: No    OB History   Grav Para Term Preterm Abortions TAB SAB Ect Mult Living                  Review of Systems  Constitutional: Negative for fever and chills.  HENT: Negative for neck pain and neck stiffness.   Eyes: Negative for pain.  Respiratory: Negative for shortness of breath.   Cardiovascular: Negative for chest pain.  Gastrointestinal: Negative for vomiting and abdominal pain.  Genitourinary: Negative for dysuria.  Musculoskeletal: Positive for back pain.  Skin: Negative for rash.  Neurological: Negative for headaches.  All other systems reviewed and are negative.    Allergies  Codeine; Nitrofurantoin; Percocet; Propoxyphene and methadone; Vicodin; Flagyl; Clarithromycin; Penicillins; Prednisone; and Sulfonamide derivatives  Home Medications   Current Outpatient Rx  Name  Route  Sig  Dispense  Refill  . acetaminophen (TYLENOL) 650 MG CR tablet   Oral   Take 650 mg by mouth every 8 (eight) hours as needed. For pain         . ALPRAZolam (XANAX) 0.25 MG tablet   Oral   Take 1 tablet (0.25 mg total) by mouth 3 (three) times daily as needed. For anxiety   30 tablet   5   . aspirin 81 MG tablet   Oral   Take 81 mg by mouth daily.         Marland Kitchen atorvastatin (LIPITOR) 20 MG tablet   Oral   Take 20 mg by mouth 2 (two) times a week. On Monday and Friday         . bisoprolol (ZEBETA) 5 MG tablet   Oral   Take 0.5 tablets (2.5 mg total) by mouth daily.   30 tablet   6   . clidinium-chlordiazePOXIDE (LIBRAX) 2.5-5 MG per capsule    Oral   Take 1 capsule by mouth 3 (three) times daily as needed.   60 capsule   3   . cyclobenzaprine (FLEXERIL) 5 MG tablet   Oral   Take 1 tablet (5 mg total) by mouth 3 (three) times daily as needed for muscle spasms.   30 tablet   0   . diclofenac (VOLTAREN) 75 MG EC tablet   Oral   Take 1 tablet (75 mg total) by mouth 2 (two) times daily.   30 tablet   0   . fexofenadine (ALLEGRA) 180 MG tablet   Oral   Take 180 mg by mouth daily.           Marland Kitchen omeprazole (PRILOSEC) 40 MG capsule   Oral   Take 40 mg by mouth daily.           BP 179/77  Pulse 80  Temp(Src) 98.2 F (36.8 C) (Oral)  Resp 14  SpO2 98%  Physical Exam  Constitutional: She is oriented to person, place, and time. She appears well-developed and well-nourished.  HENT:  Head: Normocephalic and atraumatic.  Eyes: EOM are normal. Pupils are equal, round, and reactive to light.  Neck: Neck supple.  Cardiovascular: Normal rate, regular rhythm and intact distal pulses.   Pulmonary/Chest: Effort normal and breath sounds normal. No respiratory distress.  Musculoskeletal: Normal range of motion. She exhibits no edema.  R lower parathoracic tenderness, no RUQ tenderness and neg Murphys sign. No lesion/ rash.  Neurological: She is alert and oriented to person, place, and time.  Skin: Skin is warm and dry.    ED Course  Procedures (including critical care time)  Labs Reviewed - No data to display Dg Chest 2 View  03/14/2013   *RADIOLOGY REPORT*  Clinical Data: Status post fall; right-sided chest pain.  CHEST - 2 VIEW  Comparison: Chest radiograph from 09/01/2012  Findings: The lungs are well-aerated and clear.  There is no evidence of focal opacification, pleural effusion or pneumothorax.  The heart is normal in size; the mediastinal contour is within normal limits.  No acute osseous abnormalities are seen.  IMPRESSION: No acute cardiopulmonary process seen; no displaced rib fractures identified.   Original  Report Authenticated By: Tonia Ghent, M.D.   Dg Lumbar Spine Complete  03/14/2013   *RADIOLOGY REPORT*  Clinical Data: Status post fall; lower back pain.  LUMBAR SPINE - COMPLETE 4+ VIEW  Comparison: CT of the abdomen and pelvis performed 09/01/2012  Findings: There is no evidence of fracture or subluxation.  The patient is status post posterior lumbar spinal fusion at L4-L5. Vertebral bodies demonstrate normal height and alignment. Intervertebral disc spaces are preserved.  The visualized neural foramina are grossly unremarkable in appearance.  The visualized bowel gas pattern is unremarkable in appearance; air and stool are noted within the colon.  The sacroiliac joints are within normal limits.  IMPRESSION: No evidence of fracture or subluxation along the lumbar spine. Status post posterior lumbar spinal fusion at L4-L5.   Original Report Authenticated By: Tonia Ghent, M.D.   Ice applied to back, imaging obtained/ reviewed.  I had a long discussion with PT regarding medication options and she can not tolerate any narcotics.  She is already taking flexeril.  The only medication she would consider is diclofenac that was given to her in the past - she still has RX at home and agrees to try this. She will f/u PCP. Back pain precautions provided and verbalized as understood.    MDM  Back pain, mechanism suggest MSK  xrays reviewed - no bony injury identified  VS and nursing notes reviewed and considered       Sunnie Nielsen, MD 03/16/13 [phone]

## 2013-03-17 ENCOUNTER — Encounter: Payer: Self-pay | Admitting: Family

## 2013-03-17 ENCOUNTER — Ambulatory Visit (INDEPENDENT_AMBULATORY_CARE_PROVIDER_SITE_OTHER): Payer: Medicare Other | Admitting: Family

## 2013-03-17 VITALS — BP 128/78 | HR 75 | Wt 143.0 lb

## 2013-03-17 DIAGNOSIS — R071 Chest pain on breathing: Secondary | ICD-10-CM | POA: Diagnosis not present

## 2013-03-17 DIAGNOSIS — F419 Anxiety disorder, unspecified: Secondary | ICD-10-CM

## 2013-03-17 DIAGNOSIS — F411 Generalized anxiety disorder: Secondary | ICD-10-CM | POA: Diagnosis not present

## 2013-03-17 DIAGNOSIS — R0789 Other chest pain: Secondary | ICD-10-CM

## 2013-03-17 MED ORDER — TRAMADOL HCL 50 MG PO TABS: 50.0000 mg | ORAL_TABLET | Freq: Three times a day (TID) | ORAL | Status: DC | PRN

## 2013-03-17 MED ORDER — ALPRAZOLAM 0.25 MG PO TABS: 0.2500 mg | ORAL_TABLET | Freq: Three times a day (TID) | ORAL | Status: DC | PRN

## 2013-03-17 MED ORDER — CIPROFLOXACIN HCL 250 MG PO TABS: 250.0000 mg | ORAL_TABLET | Freq: Two times a day (BID) | ORAL | Status: DC

## 2013-03-17 MED ORDER — PREDNISONE 20 MG PO TABS: 40.0000 mg | ORAL_TABLET | Freq: Every day | ORAL | Status: AC

## 2013-03-17 NOTE — Progress Notes (Signed)
Subjective:    Patient ID: Bianca Fry, female    DOB: 12-03-1945, 67 y.o.   MRN: [id number]  HPI 67 year old white female, nonsmoker, patient of Dr. Lovell Sheehan is in as an emergency department followup from 03/15/2013. She was seen in the emergency department after a mechanical fall at home where she landed on her left side but subsequently developed tenderness and pain to the right upper back. She was seen at the emergency department and had x-rays that were negative. She continues to have pain to the right upper back describes it as piercing and sharp, rates it a 10 out of 10 at its worse with movement. She's been taken a muscle relaxer, Flexeril and diclofenac with no relief. She's declined any stronger pain medication.  Reports getting a UTI every time she take Prednisone.   Review of Systems  Constitutional: Negative.   Respiratory: Negative.   Cardiovascular: Negative.   Gastrointestinal: Negative.   Musculoskeletal: Positive for back pain.       Right upper back  Skin: Negative.   Neurological: Negative.   Psychiatric/Behavioral: Negative.    Past Medical History  Diagnosis Date  . GERD (gastroesophageal reflux disease)   . Allergy   . Hyperlipidemia   . Arthritis     neck and shoulders, right fingers  . DeQuervain's disease (tenosynovitis) 10/2011    right wrist  . Hypertension     under control; has been on med. since 2009  . Erosive gastritis   . Hiatal hernia   . Internal hemorrhoids   . Colitis   . Anxiety   . Cataract   . Diverticulosis   . Ischemic colitis     History   Social History  . Marital Status: Married    Spouse Name: N/A    Number of Children: 1  . Years of Education: N/A   Occupational History  . Retired    Social History Main Topics  . Smoking status: Never Smoker   . Smokeless tobacco: Never Used  . Alcohol Use: No  . Drug Use: No  . Sexually Active: Not on file   Other Topics Concern  . Not on file   Social History Narrative   Daily caffeine     Past Surgical History  Procedure Laterality Date  . Laparoscopic lysis intestinal adhesions  1999  . Cholecystectomy  1970  . Appendectomy  1970    at same time as gallbladder  . Abdominal hysterectomy  1985    partial  . Tonsillectomy  1984  . Cataract extraction  2009    right with lens implant  . Lumbar laminectomy/decompression microdiscectomy  11/29/2007; 12/29/2007; 03/15/2008    left L4-5; fusion 5/09 surgery  . Anterior and posterior vaginal repair  12/31/2009    with TVT sling and cysto  . Dorsal compartment release  11/17/2011    Procedure: RELEASE DORSAL COMPARTMENT (DEQUERVAIN);  Surgeon: Wyn Forster., MD;  Location: [facility];  Service: Orthopedics;  Laterality: Right;  First dorsal compartment release  . Colonoscopy  02/16/12    Dr. Claudette Head  . Eyelid surgery  05/12/2012    right    Family History  Problem Relation Age of Onset  . Adopted: Yes  . Colon cancer Neg Hx   . Diabetes Father   . Stroke Father 32  . Heart disease Mother     Allergies  Allergen Reactions  . Codeine Other (See Comments)    Chest pain  . Nitrofurantoin Other (See  Comments)    Chest pain  . Percocet (Oxycodone-Acetaminophen) Nausea And Vomiting  . Propoxyphene And Methadone Nausea And Vomiting  . Vicodin (Hydrocodone-Acetaminophen) Nausea And Vomiting  . Flagyl (Metronidazole) Diarrhea and Nausea Only  . Clarithromycin Other (See Comments)    Abd. cramps  . Penicillins Rash  . Prednisone Other (See Comments)    UTI  . Sulfonamide Derivatives Other (See Comments)    GI upset    Current Outpatient Prescriptions on File Prior to Visit  Medication Sig Dispense Refill  . acetaminophen (TYLENOL) 650 MG CR tablet Take 650 mg by mouth every 8 (eight) hours as needed. For pain      . aspirin 81 MG tablet Take 81 mg by mouth daily.      Marland Kitchen atorvastatin (LIPITOR) 20 MG tablet Take 20 mg by mouth 2 (two) times a week. On Monday and Friday      .  bisoprolol (ZEBETA) 5 MG tablet Take 0.5 tablets (2.5 mg total) by mouth daily.  30 tablet  6  . clidinium-chlordiazePOXIDE (LIBRAX) 2.5-5 MG per capsule Take 1 capsule by mouth 3 (three) times daily as needed.  60 capsule  3  . cyclobenzaprine (FLEXERIL) 5 MG tablet Take 1 tablet (5 mg total) by mouth 3 (three) times daily as needed for muscle spasms.  30 tablet  0  . diclofenac (VOLTAREN) 75 MG EC tablet Take 1 tablet (75 mg total) by mouth 2 (two) times daily.  30 tablet  0  . fexofenadine (ALLEGRA) 180 MG tablet Take 180 mg by mouth daily.        Marland Kitchen omeprazole (PRILOSEC) 40 MG capsule Take 40 mg by mouth daily.       No current facility-administered medications on file prior to visit.    BP 128/78  Pulse 75  Wt 143 lb (64.864 kg)  BMI 24.53 kg/m2  SpO2 98%chart    Objective:   Physical Exam  Constitutional: She is oriented to person, place, and time. She appears well-developed and well-nourished.  Neck: Normal range of motion. Neck supple.  Cardiovascular: Normal rate, regular rhythm and normal heart sounds.   Pulmonary/Chest: Effort normal.  Abdominal: Soft. Bowel sounds are normal.  Musculoskeletal: She exhibits tenderness.  Tenderness to palpation of the right upper back. Worse with movement. Tender to palpation.   Neurological: She is alert and oriented to person, place, and time.  Skin: Skin is warm and dry.  Psychiatric: She has a normal mood and affect.          Assessment & Plan:  Assessment:  1. Costochondritis 2. Back pain-upper  Plan: Prednisone 40mg  once a day x 5 days. Tramadol as needed. Ice to the upper back. Call the office if symptoms worsen or persist. I have provided a RX for Bactrim since she reports always getting a UTI with Prednisone.

## 2013-03-17 NOTE — Patient Instructions (Addendum)
Costochondritis Costochondritis (Tietze syndrome), or costochondral separation, is a swelling and irritation (inflammation) of the tissue (cartilage) that connects your ribs with your breastbone (sternum). It may occur on its own (spontaneously), through damage caused by an accident (trauma), or simply from coughing or minor exercise. It may take up to 6 weeks to get better and longer if you are unable to be conservative in your activities. HOME CARE INSTRUCTIONS   Avoid exhausting physical activity. Try not to strain your ribs during normal activity. This would include any activities using chest, belly (abdominal), and side muscles, especially if heavy weights are used.  Use ice for 15-20 minutes per hour while awake for the first 2 days. Place the ice in a plastic bag, and place a towel between the bag of ice and your skin.  Only take over-the-counter or prescription medicines for pain, discomfort, or fever as directed by your caregiver. SEEK IMMEDIATE MEDICAL CARE IF:   Your pain increases or you are very uncomfortable.  You have a fever.  You develop difficulty with your breathing.  You cough up blood.  You develop worse chest pains, shortness of breath, sweating, or vomiting.  You develop new, unexplained problems (symptoms). MAKE SURE YOU:   Understand these instructions.  Will watch your condition.  Will get help right away if you are not doing well or get worse. Document Released: 07/15/2005 Document Revised: 12/28/2011 Document Reviewed: 05/23/2008 [employer] Patient Information 2014 [employer].  

## 2013-03-31 ENCOUNTER — Ambulatory Visit (INDEPENDENT_AMBULATORY_CARE_PROVIDER_SITE_OTHER): Payer: Medicare Other | Admitting: Internal Medicine

## 2013-03-31 ENCOUNTER — Encounter: Payer: Self-pay | Admitting: Internal Medicine

## 2013-03-31 VITALS — BP 140/74 | HR 76 | Temp 98.6°F | Resp 16 | Ht 64.0 in | Wt 140.0 lb

## 2013-03-31 DIAGNOSIS — Z0000 Encounter for general adult medical examination without abnormal findings: Secondary | ICD-10-CM

## 2013-03-31 DIAGNOSIS — M549 Dorsalgia, unspecified: Secondary | ICD-10-CM

## 2013-03-31 DIAGNOSIS — E785 Hyperlipidemia, unspecified: Secondary | ICD-10-CM | POA: Diagnosis not present

## 2013-03-31 DIAGNOSIS — T887XXA Unspecified adverse effect of drug or medicament, initial encounter: Secondary | ICD-10-CM

## 2013-03-31 DIAGNOSIS — IMO0002 Reserved for concepts with insufficient information to code with codable children: Secondary | ICD-10-CM

## 2013-03-31 DIAGNOSIS — I10 Essential (primary) hypertension: Secondary | ICD-10-CM

## 2013-03-31 DIAGNOSIS — S2241XS Multiple fractures of ribs, right side, sequela: Secondary | ICD-10-CM

## 2013-03-31 LAB — LIPID PANEL
Cholesterol: 179 mg/dL (ref 0–200)
LDL Cholesterol: 90 mg/dL (ref 0–99)
Total CHOL/HDL Ratio: 3
VLDL: 32.2 mg/dL (ref 0.0–40.0)

## 2013-03-31 LAB — BASIC METABOLIC PANEL
BUN: 16 mg/dL (ref 6–23)
Chloride: 107 mEq/L (ref 96–112)
GFR: 76.86 mL/min (ref >60.00)
Potassium: 4.7 mEq/L (ref 3.5–5.1)
Sodium: 141 mEq/L (ref 135–145)

## 2013-03-31 LAB — HEPATIC FUNCTION PANEL
AST: 16 U/L (ref 0–37)
Bilirubin, Direct: 0.1 mg/dL (ref 0.0–0.3)
Total Bilirubin: 0.7 mg/dL (ref 0.3–1.2)

## 2013-03-31 LAB — CBC WITH DIFFERENTIAL/PLATELET
Eosinophils Absolute: 0.1 10*3/uL (ref 0.0–0.7)
Eosinophils Relative: 1.6 % (ref 0.0–5.0)
Lymphocytes Relative: 30.9 % (ref 12.0–46.0)
MCHC: 32.9 g/dL (ref 30.0–36.0)
MCV: 85 fl (ref 78.0–100.0)
Monocytes Absolute: 0.7 10*3/uL (ref 0.1–1.0)
Neutrophils Relative %: 59.2 % (ref 43.0–77.0)
Platelets: 180 10*3/uL (ref 150.0–400.0)
WBC: 8.7 10*3/uL (ref 4.5–10.5)

## 2013-03-31 LAB — POCT URINALYSIS DIPSTICK
Nitrite, UA: NEGATIVE
Protein, UA: NEGATIVE
Urobilinogen, UA: 0.2

## 2013-03-31 MED ORDER — METHYLPREDNISOLONE ACETATE 40 MG/ML IJ SUSP: 40.0000 mg | Freq: Once | INTRAMUSCULAR | Status: DC

## 2013-03-31 NOTE — Progress Notes (Signed)
Subjective:    Patient ID: Bianca Fry, female    DOB: [birth year], 67 y.o.   MRN: [id number]  HPI Patient is a 67 year old female who presents for her annual Medicare wellness examination and followup Her chronic medical conditions including hyperlipidemia hypertension and a subacute complaint of increased back pain in the lumbar with radiculopathy.  Patient had lumbar fusion at L4-5 and 2009.  She presented emergency room with acute worsening pains at the beginning of May. Plain films taken in the emergency room showed no new compression fractures. This had not had an MRI since her fusion. Patient had a fall prior to the onset of the pain  Review of Systems     Objective:   Physical Exam  Nursing note and vitals reviewed. Constitutional: She is oriented to person, place, and time. She appears well-developed and well-nourished. No distress.  HENT:  Head: Normocephalic and atraumatic.  Right Ear: External ear normal.  Left Ear: External ear normal.  Nose: Nose normal.  Mouth/Throat: Oropharynx is clear and moist.  Eyes: Conjunctivae and EOM are normal. Pupils are equal, round, and reactive to light.  Neck: Normal range of motion. Neck supple. No JVD present. No tracheal deviation present. No thyromegaly present.  Cardiovascular: Normal rate, regular rhythm, normal heart sounds and intact distal pulses.   No murmur heard. Pulmonary/Chest: Effort normal and breath sounds normal. She has no wheezes. She exhibits no tenderness.  Abdominal: Soft. Bowel sounds are normal.  Musculoskeletal: She exhibits no edema and no tenderness.  Significant point tenderness approximately the midpoint on the right posterior seventh and eighth ribs  Lymphadenopathy:    She has no cervical adenopathy.  Neurological: She is alert and oriented to person, place, and time. She has normal reflexes. No cranial nerve deficit.  Skin: Skin is warm and dry. She is not diaphoretic.  Psychiatric: She has a normal  mood and affect. Her behavior is normal.          Assessment & Plan:  Acute musculoskeletal intercostal tear versus actual rib fracture nondisplaced Supportive care Point tenderness injection with Depo-Medrol 40 and 1 cc of 1% lidocaine Patient gave informed consent site was prepped with Betadine injection was placed along the posterior rib patient tolerated the procedure well after injection instructions given to the patient  Subjective:    Bianca Fry is a 67 y.o. female who presents for Medicare Annual/Subsequent preventive examination.  Preventive Screening-Counseling & Management  Tobacco History  Smoking status  . Never Smoker   Smokeless tobacco  . Never Used     Problems Prior to Visit 1.   Current Problems (verified) Patient Active Problem List   Diagnosis Date Noted  . Chest pain 09/05/2012  . Syncope 09/05/2012  . Ischemic colitis 03/01/2012  . ADJUSTMENT DISORDER WITH DEPRESSED MOOD 12/27/2009  . COSTOCHONDRITIS 12/27/2009  . HYPERLIPIDEMIA 12/03/2008  . OTHER SPECIFIED IRON DEFICIENCY ANEMIAS 12/03/2008  . ALLERGIC RHINITIS 07/09/2008  . HYPERTENSION 11/04/2007  . BACK PAIN, LUMBAR, WITH RADICULOPATHY 11/04/2007  . GERD 05/19/2007    Medications Prior to Visit Current Outpatient Prescriptions on File Prior to Visit  Medication Sig Dispense Refill  . acetaminophen (TYLENOL) 650 MG CR tablet Take 650 mg by mouth every 8 (eight) hours as needed. For pain      . ALPRAZolam (XANAX) 0.25 MG tablet Take 1 tablet (0.25 mg total) by mouth 3 (three) times daily as needed. For anxiety  30 tablet  5  . aspirin 81 MG tablet Take 81  mg by mouth daily.      Marland Kitchen atorvastatin (LIPITOR) 20 MG tablet Take 20 mg by mouth 2 (two) times a week. On Monday and Friday      . bisoprolol (ZEBETA) 5 MG tablet Take 0.5 tablets (2.5 mg total) by mouth daily.  30 tablet  6  . clidinium-chlordiazePOXIDE (LIBRAX) 2.5-5 MG per capsule Take 1 capsule by mouth 3 (three) times daily as  needed.  60 capsule  3  . cyclobenzaprine (FLEXERIL) 5 MG tablet Take 1 tablet (5 mg total) by mouth 3 (three) times daily as needed for muscle spasms.  30 tablet  0  . diclofenac (VOLTAREN) 75 MG EC tablet Take 1 tablet (75 mg total) by mouth 2 (two) times daily.  30 tablet  0  . fexofenadine (ALLEGRA) 180 MG tablet Take 180 mg by mouth daily.        Marland Kitchen omeprazole (PRILOSEC) 40 MG capsule Take 40 mg by mouth daily.      . traMADol (ULTRAM) 50 MG tablet Take 1 tablet (50 mg total) by mouth every 8 (eight) hours as needed for pain.  30 tablet  0   No current facility-administered medications on file prior to visit.    Current Medications (verified) Current Outpatient Prescriptions  Medication Sig Dispense Refill  . acetaminophen (TYLENOL) 650 MG CR tablet Take 650 mg by mouth every 8 (eight) hours as needed. For pain      . ALPRAZolam (XANAX) 0.25 MG tablet Take 1 tablet (0.25 mg total) by mouth 3 (three) times daily as needed. For anxiety  30 tablet  5  . aspirin 81 MG tablet Take 81 mg by mouth daily.      Marland Kitchen atorvastatin (LIPITOR) 20 MG tablet Take 20 mg by mouth 2 (two) times a week. On Monday and Friday      . bisoprolol (ZEBETA) 5 MG tablet Take 0.5 tablets (2.5 mg total) by mouth daily.  30 tablet  6  . clidinium-chlordiazePOXIDE (LIBRAX) 2.5-5 MG per capsule Take 1 capsule by mouth 3 (three) times daily as needed.  60 capsule  3  . cyclobenzaprine (FLEXERIL) 5 MG tablet Take 1 tablet (5 mg total) by mouth 3 (three) times daily as needed for muscle spasms.  30 tablet  0  . diclofenac (VOLTAREN) 75 MG EC tablet Take 1 tablet (75 mg total) by mouth 2 (two) times daily.  30 tablet  0  . fexofenadine (ALLEGRA) 180 MG tablet Take 180 mg by mouth daily.        Marland Kitchen omeprazole (PRILOSEC) 40 MG capsule Take 40 mg by mouth daily.      . traMADol (ULTRAM) 50 MG tablet Take 1 tablet (50 mg total) by mouth every 8 (eight) hours as needed for pain.  30 tablet  0   No current facility-administered  medications for this visit.     Allergies (verified) Codeine; Nitrofurantoin; Percocet; Propoxyphene and methadone; Vicodin; Flagyl; Clarithromycin; Penicillins; Prednisone; and Sulfonamide derivatives   PAST HISTORY  Family History Family History  Problem Relation Age of Onset  . Adopted: Yes  . Colon cancer Neg Hx   . Diabetes Father   . Stroke Father 24  . Heart disease Mother     Social History History  Substance Use Topics  . Smoking status: Never Smoker   . Smokeless tobacco: Never Used  . Alcohol Use: No     Are there smokers in your home (other than you)? No  Risk Factors Current exercise habits: Exercise  is limited by orthopedic condition(s): rib fracture.  Dietary issues discussed: yes   Cardiac risk factors: dyslipidemia and sedentary lifestyle.  Depression Screen (Note: if answer to either of the following is "Yes", a more complete depression screening is indicated)   Over the past two weeks, have you felt down, depressed or hopeless? No  Over the past two weeks, have you felt little interest or pleasure in doing things? Yes  Have you lost interest or pleasure in daily life? No  Do you often feel hopeless? No  Do you cry easily over simple problems? No  Activities of Daily Living In your present state of health, do you have any difficulty performing the following activities?:  Driving? No Managing money?  No Feeding yourself? No Getting from bed to chair? No Climbing a flight of stairs? No Preparing food and eating?: No Bathing or showering? No Getting dressed: No Getting to the toilet? No Using the toilet:No Moving around from place to place: No In the past year have you fallen or had a near fall?:No   Are you sexually active?  Yes  Do you have more than one partner?  No  Hearing Difficulties: No Do you often ask people to speak up or repeat themselves? No Do you experience ringing or noises in your ears? No Do you have difficulty  understanding soft or whispered voices? No   Do you feel that you have a problem with memory? No  Do you often misplace items? No  Do you feel safe at home?  yes  Cognitive Testing  Alert? Yes  Normal Appearance?Yes  Oriented to person? Yes  Place? Yes   Time? Yes  Recall of three objects?  Yes  Can perform simple calculations? Yes  Displays appropriate judgment?Yes  Can read the correct time from a watch face?Yes   Advanced Directives have been discussed with the patient? Yes  List the Names of Other Physician/Practitioners you currently use: 1.  gyn  Indicate any recent Medical Services you may have received from other than [facility] providers in the past year (date may be approximate).  Immunization History  Administered Date(s) Administered  . Influenza Split 07/02/2011, 08/10/2012  . Influenza Whole 07/09/2008, 08/04/2010  . Pneumococcal Polysaccharide 12/03/2008  . Td 10/19/2002  . Tdap 04/09/2011  . Zoster 08/04/2010    Screening Tests Health Maintenance  Topic Date Due  . Pneumococcal Polysaccharide Vaccine Age 68 And Over  12/20/2009  . Mammogram  04/18/2011  . Influenza Vaccine  06/19/2013  . Tetanus/tdap  04/08/2021  . Colonoscopy  02/15/2022  . Zostavax  Completed    All answers were reviewed with the patient and necessary referrals were made:  [name], MD   03/31/2013   History reviewed: allergies, current medications, past family history, past medical history, past social history, past surgical history and problem list  Review of Systems Pertinent items are noted in HPI.    Objective:     Vision by Snellen chart: right eye:20/20, left eye:20/20  Body mass index is 24.02 kg/(m^2). BP 140/74  Pulse 76  Temp(Src) 98.6 F (37 C)  Resp 16  Ht 5\' 4"  (1.626 m)  Wt 140 lb (63.504 kg)  BMI 24.02 kg/m2  Exam per problem focused portion of visit      Assessment:      This is a routine physical examination for this healthy  Female.  Reviewed all health maintenance protocols including mammography colonoscopy bone density and reviewed appropriate screening labs. Her immunization history  was reviewed as well as her current medications and allergies refills of her chronic medications were given and the plan for yearly health maintenance was discussed all orders and referrals were made as appropriate.       Plan:     During the course of the visit the patient was educated and counseled about appropriate screening and preventive services including:    Influenza vaccine  Td vaccine  Bone densitometry screening  Diet review for nutrition referral? Yes ____  Not Indicated ____   Patient Instructions (the written plan) was given to the patient.  Medicare Attestation I have personally reviewed: The patient's medical and social history Their use of alcohol, tobacco or illicit drugs Their current medications and supplements The patient's functional ability including ADLs,fall risks, home safety risks, cognitive, and hearing and visual impairment Diet and physical activities Evidence for depression or mood disorders  The patient's weight, height, BMI, and visual acuity have been recorded in the chart.  I have made referrals, counseling, and provided education to the patient based on review of the above and I have provided the patient with a written personalized care plan for preventive services.     [name], MD   03/31/2013

## 2013-03-31 NOTE — Patient Instructions (Signed)
The patient is instructed to continue all medications as prescribed. Schedule followup with check out clerk upon leaving the clinic  

## 2013-04-03 ENCOUNTER — Encounter: Payer: Self-pay | Admitting: Internal Medicine

## 2013-04-17 ENCOUNTER — Encounter: Payer: Self-pay | Admitting: Family

## 2013-04-17 ENCOUNTER — Ambulatory Visit (INDEPENDENT_AMBULATORY_CARE_PROVIDER_SITE_OTHER): Payer: Medicare Other | Admitting: Family

## 2013-04-17 VITALS — BP 114/80 | HR 66 | Temp 98.2°F | Wt 139.0 lb

## 2013-04-17 DIAGNOSIS — J309 Allergic rhinitis, unspecified: Secondary | ICD-10-CM | POA: Diagnosis not present

## 2013-04-17 DIAGNOSIS — J029 Acute pharyngitis, unspecified: Secondary | ICD-10-CM

## 2013-04-17 DIAGNOSIS — R0982 Postnasal drip: Secondary | ICD-10-CM

## 2013-04-17 NOTE — Progress Notes (Signed)
Subjective:    Patient ID: Bianca Fry, female    DOB: 04/28/1946, 67 y.o.   MRN: [id number]  HPI 67 year old female, nonsmoker, patient of Dr. Lovell Sheehan is in today with complaints of cough, congestion, runny nose x1 week. Has been taken an antihistamine over-the-counter that is not helping much. Symptoms are worsening.   Review of Systems  Constitutional: Negative.   HENT: Positive for congestion, sore throat, rhinorrhea and postnasal drip.   Respiratory: Positive for cough.   Cardiovascular: Negative.   Musculoskeletal: Negative.   Skin: Negative.   Allergic/Immunologic: Positive for environmental allergies.  Hematological: Negative.   Psychiatric/Behavioral: Negative.    Past Medical History  Diagnosis Date  . GERD (gastroesophageal reflux disease)   . Allergy   . Hyperlipidemia   . Arthritis     neck and shoulders, right fingers  . DeQuervain's disease (tenosynovitis) 10/2011    right wrist  . Hypertension     under control; has been on med. since 2009  . Erosive gastritis   . Hiatal hernia   . Internal hemorrhoids   . Colitis   . Anxiety   . Cataract   . Diverticulosis   . Ischemic colitis     History   Social History  . Marital Status: Married    Spouse Name: N/A    Number of Children: 1  . Years of Education: N/A   Occupational History  . Retired    Social History Main Topics  . Smoking status: Never Smoker   . Smokeless tobacco: Never Used  . Alcohol Use: No  . Drug Use: No  . Sexually Active: Not on file   Other Topics Concern  . Not on file   Social History Narrative   Daily caffeine     Past Surgical History  Procedure Laterality Date  . Laparoscopic lysis intestinal adhesions  1999  . Cholecystectomy  1970  . Appendectomy  1970    at same time as gallbladder  . Abdominal hysterectomy  1985    partial  . Tonsillectomy  1984  . Cataract extraction  2009    right with lens implant  . Lumbar laminectomy/decompression microdiscectomy   11/29/2007; 12/29/2007; 03/15/2008    left L4-5; fusion 5/09 surgery  . Anterior and posterior vaginal repair  12/31/2009    with TVT sling and cysto  . Dorsal compartment release  11/17/2011    Procedure: RELEASE DORSAL COMPARTMENT (DEQUERVAIN);  Surgeon: Wyn Forster., MD;  [facility]: [facility];  Service: Orthopedics;  Laterality: Right;  First dorsal compartment release  . Colonoscopy  02/16/12    Dr. Claudette Head  . Eyelid surgery  05/12/2012    right    Family History  Problem Relation Age of Onset  . Adopted: Yes  . Colon cancer Neg Hx   . Diabetes Father   . Stroke Father 7  . Heart disease Mother     Allergies  Allergen Reactions  . Codeine Other (See Comments)    Chest pain  . Nitrofurantoin Other (See Comments)    Chest pain  . Percocet (Oxycodone-Acetaminophen) Nausea And Vomiting  . Propoxyphene And Methadone Nausea And Vomiting  . Vicodin (Hydrocodone-Acetaminophen) Nausea And Vomiting  . Flagyl (Metronidazole) Diarrhea and Nausea Only  . Clarithromycin Other (See Comments)    Abd. cramps  . Penicillins Rash  . Prednisone Other (See Comments)    UTI  . Sulfonamide Derivatives Other (See Comments)    GI upset    Current Outpatient  Prescriptions on File Prior to Visit  Medication Sig Dispense Refill  . acetaminophen (TYLENOL) 650 MG CR tablet Take 650 mg by mouth every 8 (eight) hours as needed. For pain      . ALPRAZolam (XANAX) 0.25 MG tablet Take 1 tablet (0.25 mg total) by mouth 3 (three) times daily as needed. For anxiety  30 tablet  5  . aspirin 81 MG tablet Take 81 mg by mouth daily.      Marland Kitchen atorvastatin (LIPITOR) 20 MG tablet Take 20 mg by mouth 2 (two) times a week. On Monday and Friday      . bisoprolol (ZEBETA) 5 MG tablet Take 0.5 tablets (2.5 mg total) by mouth daily.  30 tablet  6  . clidinium-chlordiazePOXIDE (LIBRAX) 2.5-5 MG per capsule Take 1 capsule by mouth 3 (three) times daily as needed.  60 capsule  3  . cyclobenzaprine  (FLEXERIL) 5 MG tablet Take 1 tablet (5 mg total) by mouth 3 (three) times daily as needed for muscle spasms.  30 tablet  0  . diclofenac (VOLTAREN) 75 MG EC tablet Take 1 tablet (75 mg total) by mouth 2 (two) times daily.  30 tablet  0  . fexofenadine (ALLEGRA) 180 MG tablet Take 180 mg by mouth daily.        Marland Kitchen omeprazole (PRILOSEC) 40 MG capsule Take 40 mg by mouth daily.      . traMADol (ULTRAM) 50 MG tablet Take 1 tablet (50 mg total) by mouth every 8 (eight) hours as needed for pain.  30 tablet  0   No current facility-administered medications on file prior to visit.    BP 114/80  Pulse 66  Temp(Src) 98.2 F (36.8 C) (Oral)  Wt 139 lb (63.05 kg)  BMI 23.85 kg/m2  SpO2 98%chart    Objective:   Physical Exam  Constitutional: She is oriented to person, place, and time. She appears well-developed and well-nourished.  HENT:  Right Ear: External ear normal.  Left Ear: External ear normal.  Nose: Nose normal.  Mouth/Throat: Oropharynx is clear and moist.  Neck: Normal range of motion. Neck supple.  Cardiovascular: Normal rate, regular rhythm and normal heart sounds.   Pulmonary/Chest: Effort normal and breath sounds normal.  Musculoskeletal: Normal range of motion.  Neurological: She is alert and oriented to person, place, and time.  Skin: Skin is warm and dry.  Psychiatric: She has a normal mood and affect.          Assessment & Plan:  Assessment:  1. allergic rhinitis 2. Postnasal drip 3. Cough  Plan: Continue antihistamine over-the-counter. Add Nasonex 2 sprays in each nostril once a day. Call the office if symptoms worsen or persist. Recheck a schedule, and as needed.

## 2013-05-02 ENCOUNTER — Encounter: Payer: Self-pay | Admitting: Internal Medicine

## 2013-05-02 ENCOUNTER — Telehealth: Payer: Self-pay | Admitting: Internal Medicine

## 2013-05-02 ENCOUNTER — Ambulatory Visit (INDEPENDENT_AMBULATORY_CARE_PROVIDER_SITE_OTHER): Payer: Medicare Other | Admitting: Internal Medicine

## 2013-05-02 VITALS — BP 120/66 | HR 71 | Temp 98.1°F | Resp 18 | Wt 142.0 lb

## 2013-05-02 DIAGNOSIS — IMO0001 Reserved for inherently not codable concepts without codable children: Secondary | ICD-10-CM | POA: Diagnosis not present

## 2013-05-02 DIAGNOSIS — I10 Essential (primary) hypertension: Secondary | ICD-10-CM | POA: Diagnosis not present

## 2013-05-02 DIAGNOSIS — T148XXA Other injury of unspecified body region, initial encounter: Secondary | ICD-10-CM | POA: Diagnosis not present

## 2013-05-02 DIAGNOSIS — W5501XA Bitten by cat, initial encounter: Secondary | ICD-10-CM

## 2013-05-02 MED ORDER — CLINDAMYCIN HCL 300 MG PO CAPS: 300.0000 mg | ORAL_CAPSULE | Freq: Three times a day (TID) | ORAL | Status: DC

## 2013-05-02 MED ORDER — DOXYCYCLINE HYCLATE 100 MG PO TABS: 100.0000 mg | ORAL_TABLET | Freq: Two times a day (BID) | ORAL | Status: DC

## 2013-05-02 NOTE — Progress Notes (Signed)
Subjective:    Patient ID: Bianca Fry, female    DOB: Apr 27, 1946, 67 y.o.   MRN: [id number]  HPI  67 year old patient who suffered a cat bite to the left anterior lower leg 2 days ago.  She has noticed some increasing pain and discomfort involving the wound.  No fever or systemic complaints nor drainage The cat involved was her pet and has been up-to-date on all the vaccinations  Past Medical History  Diagnosis Date  . GERD (gastroesophageal reflux disease)   . Allergy   . Hyperlipidemia   . Arthritis     neck and shoulders, right fingers  . DeQuervain's disease (tenosynovitis) 10/2011    right wrist  . Hypertension     under control; has been on med. since 2009  . Erosive gastritis   . Hiatal hernia   . Internal hemorrhoids   . Colitis   . Anxiety   . Cataract   . Diverticulosis   . Ischemic colitis     History   Social History  . Marital Status: Married    Spouse Name: N/A    Number of Children: 1  . Years of Education: N/A   Occupational History  . Retired    Social History Main Topics  . Smoking status: Never Smoker   . Smokeless tobacco: Never Used  . Alcohol Use: No  . Drug Use: No  . Sexually Active: Not on file   Other Topics Concern  . Not on file   Social History Narrative   Daily caffeine     Past Surgical History  Procedure Laterality Date  . Laparoscopic lysis intestinal adhesions  1999  . Cholecystectomy  1970  . Appendectomy  1970    at same time as gallbladder  . Abdominal hysterectomy  1985    partial  . Tonsillectomy  1984  . Cataract extraction  2009    right with lens implant  . Lumbar laminectomy/decompression microdiscectomy  11/29/2007; 12/29/2007; 03/15/2008    left L4-5; fusion 5/09 surgery  . Anterior and posterior vaginal repair  12/31/2009    with TVT sling and cysto  . Dorsal compartment release  11/17/2011    Procedure: RELEASE DORSAL COMPARTMENT (DEQUERVAIN);  Surgeon: Wyn Forster., MD;  Location: [facility];  Service: Orthopedics;  Laterality: Right;  First dorsal compartment release  . Colonoscopy  02/16/12    Dr. Claudette Head  . Eyelid surgery  05/12/2012    right    Family History  Problem Relation Age of Onset  . Adopted: Yes  . Colon cancer Neg Hx   . Diabetes Father   . Stroke Father 73  . Heart disease Mother     Allergies  Allergen Reactions  . Codeine Other (See Comments)    Chest pain  . Nitrofurantoin Other (See Comments)    Chest pain  . Percocet (Oxycodone-Acetaminophen) Nausea And Vomiting  . Propoxyphene And Methadone Nausea And Vomiting  . Vicodin (Hydrocodone-Acetaminophen) Nausea And Vomiting  . Flagyl (Metronidazole) Diarrhea and Nausea Only  . Clarithromycin Other (See Comments)    Abd. cramps  . Penicillins Rash  . Prednisone Other (See Comments)    UTI  . Sulfonamide Derivatives Other (See Comments)    GI upset    Current Outpatient Prescriptions on File Prior to Visit  Medication Sig Dispense Refill  . acetaminophen (TYLENOL) 650 MG CR tablet Take 650 mg by mouth every 8 (eight) hours as needed. For pain      .  ALPRAZolam (XANAX) 0.25 MG tablet Take 1 tablet (0.25 mg total) by mouth 3 (three) times daily as needed. For anxiety  30 tablet  5  . aspirin 81 MG tablet Take 81 mg by mouth daily.      Marland Kitchen atorvastatin (LIPITOR) 20 MG tablet Take 20 mg by mouth 2 (two) times a week. On Monday and Friday      . bisoprolol (ZEBETA) 5 MG tablet Take 0.5 tablets (2.5 mg total) by mouth daily.  30 tablet  6  . clidinium-chlordiazePOXIDE (LIBRAX) 2.5-5 MG per capsule Take 1 capsule by mouth 3 (three) times daily as needed.  60 capsule  3  . cyclobenzaprine (FLEXERIL) 5 MG tablet Take 1 tablet (5 mg total) by mouth 3 (three) times daily as needed for muscle spasms.  30 tablet  0  . diclofenac (VOLTAREN) 75 MG EC tablet Take 1 tablet (75 mg total) by mouth 2 (two) times daily.  30 tablet  0  . fexofenadine (ALLEGRA) 180 MG tablet Take 180 mg by mouth  daily.        Marland Kitchen omeprazole (PRILOSEC) 40 MG capsule Take 40 mg by mouth daily.      . traMADol (ULTRAM) 50 MG tablet Take 1 tablet (50 mg total) by mouth every 8 (eight) hours as needed for pain.  30 tablet  0   No current facility-administered medications on file prior to visit.    BP 120/66  Pulse 71  Temp(Src) 98.1 F (36.7 C) (Oral)  Resp 18  Wt 142 lb (64.411 kg)  BMI 24.36 kg/m2  SpO2 98%     Review of Systems  Skin: Positive for wound.       Objective:   Physical Exam  Constitutional: She appears well-developed and well-nourished. No distress.  Skin:  4 puncture wounds were noted involving her left anterior lower leg area a few superficial scratches were noted involving the posterior calf; the two  medial puncture wounds were slightly erythematous and edematous but no exudate          Assessment & Plan:   Cat bite. The patient has multiple drug allergies including a penicillin allergy that causes hives. We'll treat with doxycycline and clindamycin. Local wound care discussed

## 2013-05-02 NOTE — Telephone Encounter (Signed)
Patient Information:  Caller Name: Evvie  Phone: [phone]  Patient: Bianca Fry, Bianca Fry  Gender: Female  DOB: 1946/07/07  Age: 67 Years  PCP: Darryll Capers (Adults only)  Office Follow Up:  Does the office need to follow up with this patient?: No  Instructions For The Office: N/A   Symptoms  Reason For Call & Symptoms: Cat bite on left leg. Several puncture wounds that are now looking infected.  Reviewed Health History In EMR: Yes  Reviewed Medications In EMR: Yes  Reviewed Allergies In EMR: Yes  Reviewed Surgeries / Procedures: Yes  Date of Onset of Symptoms: 04/30/2013  Treatments Tried: Neosporin, Cleaning daily-soap/water  Treatments Tried Worked: No  Guideline(s) Used:  Lobbyist  Disposition Per Guideline:   Go to Office Now  Reason For Disposition Reached:   Puncture wound (holes through the skin) from cat (teeth or claws)  Advice Given:  N/A  Patient Will Follow Care Advice:  YES  Appointment Scheduled:  05/02/2013 16:15:00 Appointment Scheduled Provider:  Eleonore Chiquito (Family Practice)

## 2013-05-02 NOTE — Patient Instructions (Signed)

## 2013-05-24 ENCOUNTER — Other Ambulatory Visit: Payer: Self-pay

## 2013-06-16 ENCOUNTER — Encounter: Payer: Self-pay | Admitting: Gastroenterology

## 2013-06-27 ENCOUNTER — Ambulatory Visit (INDEPENDENT_AMBULATORY_CARE_PROVIDER_SITE_OTHER): Payer: Medicare Other | Admitting: Family Medicine

## 2013-06-27 ENCOUNTER — Encounter: Payer: Self-pay | Admitting: Family Medicine

## 2013-06-27 VITALS — BP 124/70 | Temp 98.3°F | Wt 144.0 lb

## 2013-06-27 DIAGNOSIS — R300 Dysuria: Secondary | ICD-10-CM | POA: Diagnosis not present

## 2013-06-27 LAB — POCT URINALYSIS DIPSTICK
Nitrite, UA: NEGATIVE
Protein, UA: NEGATIVE
Urobilinogen, UA: 0.2
pH, UA: 5.5

## 2013-06-27 MED ORDER — CIPROFLOXACIN HCL 500 MG PO TABS: 500.0000 mg | ORAL_TABLET | Freq: Two times a day (BID) | ORAL | Status: DC

## 2013-06-27 NOTE — Progress Notes (Signed)
Chief Complaint  Patient presents with  . Dysuria    HPI:  Bianca Fry, a 67 yo F patient of Dr. Lovell Sheehan is here for an acute visit for dysuria: -started a few days ago -symptoms: a little burning with urinary, urgency -denies: fevers, chills, flank pain, NVD -hx of UTIs in the past, but better since bladder tact -she has been drinking a lot of water -has nausea with a lot of abx - not really allergies - can take cipro  ROS: See pertinent positives and negatives per HPI.  Past Medical History  Diagnosis Date  . GERD (gastroesophageal reflux disease)   . Allergy   . Hyperlipidemia   . Arthritis     neck and shoulders, right fingers  . DeQuervain's disease (tenosynovitis) 10/2011    right wrist  . Hypertension     under control; has been on med. since 2009  . Erosive gastritis   . Hiatal hernia   . Internal hemorrhoids   . Colitis   . Anxiety   . Cataract   . Diverticulosis   . Ischemic colitis     Past Surgical History  Procedure Laterality Date  . Laparoscopic lysis intestinal adhesions  1999  . Cholecystectomy  1970  . Appendectomy  1970    at same time as gallbladder  . Abdominal hysterectomy  1985    partial  . Tonsillectomy  1984  . Cataract extraction  2009    right with lens implant  . Lumbar laminectomy/decompression microdiscectomy  11/29/2007; 12/29/2007; 03/15/2008    left L4-5; fusion 5/09 surgery  . Anterior and posterior vaginal repair  12/31/2009    with TVT sling and cysto  . Dorsal compartment release  11/17/2011    Procedure: RELEASE DORSAL COMPARTMENT (DEQUERVAIN);  Surgeon: Wyn Forster., MD;  Location: [facility];  Service: Orthopedics;  Laterality: Right;  First dorsal compartment release  . Colonoscopy  02/16/12    Dr. Claudette Head  . Eyelid surgery  05/12/2012    right    Family History  Problem Relation Age of Onset  . Adopted: Yes  . Colon cancer Neg Hx   . Diabetes Father   . Stroke Father 38  . Heart disease  Mother     History   Social History  . Marital Status: Married    Spouse Name: N/A    Number of Children: 1  . Years of Education: N/A   Occupational History  . Retired    Social History Main Topics  . Smoking status: Never Smoker   . Smokeless tobacco: Never Used  . Alcohol Use: No  . Drug Use: No  . Sexual Activity: None   Other Topics Concern  . None   Social History Narrative   Daily caffeine     Current outpatient prescriptions:acetaminophen (TYLENOL) 650 MG CR tablet, Take 650 mg by mouth every 8 (eight) hours as needed. For pain, Disp: , Rfl: ;  ALPRAZolam (XANAX) 0.25 MG tablet, Take 1 tablet (0.25 mg total) by mouth 3 (three) times daily as needed. For anxiety, Disp: 30 tablet, Rfl: 5;  aspirin 81 MG tablet, Take 81 mg by mouth daily., Disp: , Rfl:  atorvastatin (LIPITOR) 20 MG tablet, Take 20 mg by mouth 2 (two) times a week. On Monday and Friday, Disp: , Rfl: ;  bisoprolol (ZEBETA) 5 MG tablet, Take 0.5 tablets (2.5 mg total) by mouth daily., Disp: 30 tablet, Rfl: 6;  clidinium-chlordiazePOXIDE (LIBRAX) 2.5-5 MG per capsule, Take  1 capsule by mouth 3 (three) times daily as needed., Disp: 60 capsule, Rfl: 3 clindamycin (CLEOCIN) 300 MG capsule, Take 1 capsule (300 mg total) by mouth 3 (three) times daily., Disp: 30 capsule, Rfl: 0;  cyclobenzaprine (FLEXERIL) 5 MG tablet, Take 1 tablet (5 mg total) by mouth 3 (three) times daily as needed for muscle spasms., Disp: 30 tablet, Rfl: 0;  diclofenac (VOLTAREN) 75 MG EC tablet, Take 1 tablet (75 mg total) by mouth 2 (two) times daily., Disp: 30 tablet, Rfl: 0 doxycycline (VIBRA-TABS) 100 MG tablet, Take 1 tablet (100 mg total) by mouth 2 (two) times daily., Disp: 20 tablet, Rfl: 0;  fexofenadine (ALLEGRA) 180 MG tablet, Take 180 mg by mouth daily.  , Disp: , Rfl: ;  omeprazole (PRILOSEC) 40 MG capsule, Take 40 mg by mouth daily., Disp: , Rfl: ;  traMADol (ULTRAM) 50 MG tablet, Take 1 tablet (50 mg total) by mouth every 8 (eight)  hours as needed for pain., Disp: 30 tablet, Rfl: 0 ciprofloxacin (CIPRO) 500 MG tablet, Take 1 tablet (500 mg total) by mouth 2 (two) times daily., Disp: 6 tablet, Rfl: 0  EXAM:  Filed Vitals:   06/27/13 0913  BP: 124/70  Temp: 98.3 F (36.8 C)    Body mass index is 24.71 kg/(m^2).  GENERAL: vitals reviewed and listed above, alert, oriented, appears well hydrated and in no acute distress  HEENT: atraumatic, conjunttiva clear, no obvious abnormalities on inspection of external nose and ears  NECK: no obvious masses on inspection  LUNGS: clear to auscultation bilaterally, no wheezes, rales or rhonchi, good air movement  CV: HRRR, no peripheral edema  ABD: soft, NTTP, no CVA TTP  MS: moves all extremities without noticeable abnormality  PSYCH: pleasant and cooperative, no obvious depression or anxiety  ASSESSMENT AND PLAN:  Discussed the following assessment and plan:  Dysuria - Plan: POCT urinalysis dipstick, Culture, Urine, ciprofloxacin (CIPRO) 500 MG tablet  -urine with bld and leuks, discussed options and will do empiric cipro as she reports this is one of the few abx that does not upset her stomach -risks and return precautions discussed -Patient advised to return or notify a doctor immediately if symptoms worsen or persist or new concerns arise.  There are no Patient Instructions on file for this visit.   Kriste Basque R.

## 2013-07-03 ENCOUNTER — Telehealth: Payer: Self-pay | Admitting: Internal Medicine

## 2013-07-03 NOTE — Telephone Encounter (Signed)
Per dr Robbie Louis culture didn't grow anything, and she states it feels like "bottom is falling " she needs to go to [facility]- talked with pt and she had talked with them earlier and they were reluctanct to see her right now because her gyn had moved to another office. Told pt to call and tell them she needs to see then and if anyproblems we will try to help to get her in

## 2013-07-03 NOTE — Telephone Encounter (Signed)
Patient saw Dr. Selena Batten for UTI 06/27/13. She was given 3 day-course of CIPRO. Patient states it did not clear up the UTI - she hurts now more than she did. Requesting more/different abx. Please advise.  [employer]

## 2013-07-05 ENCOUNTER — Ambulatory Visit (INDEPENDENT_AMBULATORY_CARE_PROVIDER_SITE_OTHER): Payer: Medicare Other | Admitting: Obstetrics and Gynecology

## 2013-07-05 ENCOUNTER — Encounter: Payer: Self-pay | Admitting: Obstetrics and Gynecology

## 2013-07-05 VITALS — BP 144/70 | HR 70 | Ht 63.5 in | Wt 143.5 lb

## 2013-07-05 DIAGNOSIS — R1032 Left lower quadrant pain: Secondary | ICD-10-CM

## 2013-07-05 DIAGNOSIS — R300 Dysuria: Secondary | ICD-10-CM | POA: Diagnosis not present

## 2013-07-05 DIAGNOSIS — R319 Hematuria, unspecified: Secondary | ICD-10-CM | POA: Diagnosis not present

## 2013-07-05 DIAGNOSIS — N390 Urinary tract infection, site not specified: Secondary | ICD-10-CM

## 2013-07-05 LAB — POCT URINALYSIS DIPSTICK
Ketones, UA: NEGATIVE
Protein, UA: NEGATIVE
pH, UA: 5

## 2013-07-05 MED ORDER — LEVOFLOXACIN 750 MG PO TABS: 750.0000 mg | ORAL_TABLET | Freq: Every day | ORAL | Status: DC

## 2013-07-05 NOTE — Progress Notes (Signed)
Quick Note:  Called and spoke with pt and pt is aware. Pt was seen today by the gynecologist. ______

## 2013-07-05 NOTE — Patient Instructions (Signed)
Urinary Tract Infection  Urinary tract infections (UTIs) can develop anywhere along your urinary tract. Your urinary tract is your body's drainage system for removing wastes and extra water. Your urinary tract includes two kidneys, two ureters, a bladder, and a urethra. Your kidneys are a pair of bean-shaped organs. Each kidney is about the size of your fist. They are located below your ribs, one on each side of your spine.  CAUSES  Infections are caused by microbes, which are microscopic organisms, including fungi, viruses, and bacteria. These organisms are so small that they can only be seen through a microscope. Bacteria are the microbes that most commonly cause UTIs.  SYMPTOMS   Symptoms of UTIs may vary by age and gender of the patient and by the location of the infection. Symptoms in young women typically include a frequent and intense urge to urinate and a painful, burning feeling in the bladder or urethra during urination. Older women and men are more likely to be tired, shaky, and weak and have muscle aches and abdominal pain. A fever may mean the infection is in your kidneys. Other symptoms of a kidney infection include pain in your back or sides below the ribs, nausea, and vomiting.  DIAGNOSIS  To diagnose a UTI, your caregiver will ask you about your symptoms. Your caregiver also will ask to provide a urine sample. The urine sample will be tested for bacteria and white blood cells. White blood cells are made by your body to help fight infection.  TREATMENT   Typically, UTIs can be treated with medication. Because most UTIs are caused by a bacterial infection, they usually can be treated with the use of antibiotics. The choice of antibiotic and length of treatment depend on your symptoms and the type of bacteria causing your infection.  HOME CARE INSTRUCTIONS   If you were prescribed antibiotics, take them exactly as your caregiver instructs you. Finish the medication even if you feel better after you  have only taken some of the medication.   Drink enough water and fluids to keep your urine clear or pale yellow.   Avoid caffeine, tea, and carbonated beverages. They tend to irritate your bladder.   Empty your bladder often. Avoid holding urine for long periods of time.   Empty your bladder before and after sexual intercourse.   After a bowel movement, women should cleanse from front to back. Use each tissue only once.  SEEK MEDICAL CARE IF:    You have back pain.   You develop a fever.   Your symptoms do not begin to resolve within 3 days.  SEEK IMMEDIATE MEDICAL CARE IF:    You have severe back pain or lower abdominal pain.   You develop chills.   You have nausea or vomiting.   You have continued burning or discomfort with urination.  MAKE SURE YOU:    Understand these instructions.   Will watch your condition.   Will get help right away if you are not doing well or get worse.  Document Released: 07/15/2005 Document Revised: 04/05/2012 Document Reviewed: 11/13/2011  [employer] Patient Information 2014 [employer].

## 2013-07-05 NOTE — Progress Notes (Signed)
Patient ID: Bianca Fry, female   DOB: Sep 17, 1946, 67 y.o.   MRN: [id number] GYNECOLOGY PROBLEM VISIT  PCP:  Darryll Capers, MD  Referring provider:   HPI: 67 y.o.   Married  Caucasian  female   G1P1001 with No LMP recorded. Patient has had a hysterectomy.  Ovaries are still there.   here for  Dysuria.  This feels like a typical UTI to the patient.  Having constant pain in lower abdomen for a about a week. Treated for a urinary tract infection with Cipro for 3 days, but urine culture was negative.   Noted to have microscopic hematuria but no gross hematuria. Had one UTI since last October 2013. UTIs have diminished since surgery on 12/31/12 - anterior ad posterior colporrhaphy, TVT and cystoscopy.   Still with dyspareunia.  Not using Premarin vaginal cream.  Did not see a difference.    No new partners.  History of ischemic colitis diagnosed last year.  Diagnosed when had rectal bleeding.  Had several recurrences since.  Also being treated for IBS by Dr. Darryll Capers. The pain today feels very different from the ischemic colitis.   GYNECOLOGIC HISTORY: No LMP recorded. Patient has had a hysterectomy. Sexually active:   Partner preference: female Contraception:  hysrerectomy  Menopausal hormone therapy: no DES exposure:  no  Blood transfusions:   no Sexually transmitted diseases: no   GYN Procedures:  hysterectomy Mammogram:                 Pap:    History of abnormal pap smear:    Urine: 1+ WBC's, Trace RBC's OB History   Grav Para Term Preterm Abortions TAB SAB Ect Mult Living   1 1 1       1          Family History  Problem Relation Age of Onset  . Adopted: Yes  . Colon cancer Neg Hx   . Diabetes Father   . Stroke Father 9  . Hypertension Father   . Heart disease Mother   . Hypertension Mother   . Hyperlipidemia Mother   . Hypertension Sister   . Hypertension Brother   . Seizures Brother   . Hypertension Sister   . Hypertension Brother   . Hypertension Brother    . Hypertension Brother     Patient Active Problem List   Diagnosis Date Noted  . Chest pain 09/05/2012  . Syncope 09/05/2012  . Ischemic colitis 03/01/2012  . ADJUSTMENT DISORDER WITH DEPRESSED MOOD 12/27/2009  . COSTOCHONDRITIS 12/27/2009  . HYPERLIPIDEMIA 12/03/2008  . OTHER SPECIFIED IRON DEFICIENCY ANEMIAS 12/03/2008  . ALLERGIC RHINITIS 07/09/2008  . HYPERTENSION 11/04/2007  . BACK PAIN, LUMBAR, WITH RADICULOPATHY 11/04/2007  . GERD 05/19/2007    Past Medical History  Diagnosis Date  . GERD (gastroesophageal reflux disease)   . Allergy   . Hyperlipidemia   . Arthritis     neck and shoulders, right fingers  . DeQuervain's disease (tenosynovitis) 10/2011    right wrist  . Hypertension     under control; has been on med. since 2009  . Erosive gastritis   . Hiatal hernia   . Internal hemorrhoids   . Colitis   . Anxiety   . Cataract   . Diverticulosis   . Ischemic colitis   . Anemia   . Dyspareunia     Past Surgical History  Procedure Laterality Date  . Laparoscopic lysis intestinal adhesions  1999  . Cholecystectomy  1970  . Appendectomy  1970    at same time as gallbladder  . Abdominal hysterectomy  1985    partial  . Tonsillectomy  1984  . Cataract extraction  2009    right with lens implant  . Lumbar laminectomy/decompression microdiscectomy  11/29/2007; 12/29/2007; 03/15/2008    left L4-5; fusion 5/09 surgery  . Anterior and posterior vaginal repair  12/31/2009    with TVT sling and cysto  . Dorsal compartment release  11/17/2011    Procedure: RELEASE DORSAL COMPARTMENT (DEQUERVAIN);  Surgeon: Wyn Forster., MD;  Location: [facility];  Service: Orthopedics;  Laterality: Right;  First dorsal compartment release  . Colonoscopy  02/16/12    Dr. Claudette Head  . Eyelid surgery  05/12/2012    right    ALLERGIES: Codeine; Nitrofurantoin; Percocet; Propoxyphene and methadone; Vicodin; Flagyl; Clarithromycin; Penicillins; Prednisone; and  Sulfonamide derivatives  Current Outpatient Prescriptions  Medication Sig Dispense Refill  . acetaminophen (TYLENOL) 650 MG CR tablet Take 650 mg by mouth every 8 (eight) hours as needed. For pain      . ALPRAZolam (XANAX) 0.25 MG tablet Take 1 tablet (0.25 mg total) by mouth 3 (three) times daily as needed. For anxiety  30 tablet  5  . aspirin 81 MG tablet Take 81 mg by mouth daily.      Marland Kitchen atorvastatin (LIPITOR) 20 MG tablet Take 20 mg by mouth 2 (two) times a week. On Monday and Friday      . bisoprolol (ZEBETA) 5 MG tablet Take 0.5 tablets (2.5 mg total) by mouth daily.  30 tablet  6  . clidinium-chlordiazePOXIDE (LIBRAX) 2.5-5 MG per capsule Take 1 capsule by mouth 3 (three) times daily as needed.  60 capsule  3  . fexofenadine (ALLEGRA) 180 MG tablet Take 180 mg by mouth daily.        Marland Kitchen omeprazole (PRILOSEC) 40 MG capsule Take 40 mg by mouth daily.      . ciprofloxacin (CIPRO) 500 MG tablet Take 1 tablet (500 mg total) by mouth 2 (two) times daily.  6 tablet  0  . clindamycin (CLEOCIN) 300 MG capsule Take 1 capsule (300 mg total) by mouth 3 (three) times daily.  30 capsule  0  . cyclobenzaprine (FLEXERIL) 5 MG tablet Take 1 tablet (5 mg total) by mouth 3 (three) times daily as needed for muscle spasms.  30 tablet  0  . diclofenac (VOLTAREN) 75 MG EC tablet Take 1 tablet (75 mg total) by mouth 2 (two) times daily.  30 tablet  0  . doxycycline (VIBRA-TABS) 100 MG tablet Take 1 tablet (100 mg total) by mouth 2 (two) times daily.  20 tablet  0  . traMADol (ULTRAM) 50 MG tablet Take 1 tablet (50 mg total) by mouth every 8 (eight) hours as needed for pain.  30 tablet  0   No current facility-administered medications for this visit.     ROS:  Pertinent items are noted in HPI.  SOCIAL HISTORY:    PHYSICAL EXAMINATION:    BP 144/70  Pulse 70  Ht 5' 3.5" (1.613 m)  Wt 143 lb 8 oz (65.091 kg)  BMI 25.02 kg/m2   Wt Readings from Last 3 Encounters:  07/05/13 143 lb 8 oz (65.091 kg)   06/27/13 144 lb (65.318 kg)  05/02/13 142 lb (64.411 kg)     Ht Readings from Last 3 Encounters:  07/05/13 5' 3.5" (1.613 m)  03/31/13 5\' 4"  (1.626 m)  12/05/12 5\' 4"  (1.626 m)  General appearance: alert, cooperative and appears stated age Abdomen: normal bowel sounds, soft; no masses,  no organomegaly, mild lower abdominal tenderness. Left greater and right. No guarding or rebound.  Back:  No CVA tenderness. Pelvic: External genitalia:  no lesions              Urethra:  normal appearing urethra with no masses, tenderness or lesions              Bartholins and Skenes: normal                 Vagina: normal appearing vagina with normal color and discharge, no lesions, good support              Cervix: normal appearance                    Bimanual Exam:  Uterus:  uterus is absent                                      Adnexa:  Left adnexal region tender and without mass.  No right adnexal mass or tenderness.                                      Rectovaginal: Confirms                                      Anus:  normal sphincter tone, no lesions  ASSESSMENT  Possible UTI LLQ pain History of ischemic colitis  IBS    PLAN  Urine culture. Levaquin.  See orders. Return tomorrow at 1:30 for an ultrasound. If the above returns negative, patient will need to see PCP for further evaluation.  Has an appointment for next week.    An After Visit Summary was printed and given to the patient.

## 2013-07-06 ENCOUNTER — Ambulatory Visit (INDEPENDENT_AMBULATORY_CARE_PROVIDER_SITE_OTHER): Payer: Medicare Other | Admitting: Obstetrics and Gynecology

## 2013-07-06 ENCOUNTER — Encounter: Payer: Self-pay | Admitting: Obstetrics and Gynecology

## 2013-07-06 ENCOUNTER — Ambulatory Visit (INDEPENDENT_AMBULATORY_CARE_PROVIDER_SITE_OTHER): Payer: Medicare Other

## 2013-07-06 VITALS — BP 140/66 | HR 60 | Ht 63.5 in | Wt 143.0 lb

## 2013-07-06 DIAGNOSIS — R1032 Left lower quadrant pain: Secondary | ICD-10-CM | POA: Diagnosis not present

## 2013-07-06 NOTE — Progress Notes (Signed)
    Subjective  Patient here for pelvic ultrasound for LLQ pain. Treated for UTI starting yesterday. Urine culture is pending.   Feels a little better. Had a negative culture with PCP prior to coming here.  Patient has a history of UTIs prior to her bladder surgery. Saw [facility] for these and had a negative workup.    Objective  Pelvic ultrasound absent uterus.  Normal ovaries.  No masses.  Assessment  Being treated for presumed UTI. Urine culture pending.  Normal pelvic ultrasound. History of ischemic colitis and IBS.  Plan  Follow up urine culture. See Dr. Lovell Sheehan next week for already scheduled appointment. If urine cultures are negative and Dr. Lovell Sheehan believes this is not intestinal, will refer to [facility] for evaluation for possible stones or interstitial cystitis.

## 2013-07-07 ENCOUNTER — Encounter: Payer: Self-pay | Admitting: Obstetrics and Gynecology

## 2013-07-07 ENCOUNTER — Other Ambulatory Visit: Payer: Self-pay | Admitting: Obstetrics and Gynecology

## 2013-07-07 DIAGNOSIS — R300 Dysuria: Secondary | ICD-10-CM

## 2013-07-07 LAB — URINE CULTURE: Colony Count: 9000

## 2013-07-11 ENCOUNTER — Telehealth: Payer: Self-pay | Admitting: Obstetrics and Gynecology

## 2013-07-12 ENCOUNTER — Ambulatory Visit: Payer: Medicare Other | Admitting: Internal Medicine

## 2013-07-12 NOTE — Telephone Encounter (Signed)
Call to pt to advise appt at [facility].  Patient scheduled on 08/09/13 @ 1230

## 2013-07-13 ENCOUNTER — Encounter: Payer: Self-pay | Admitting: Internal Medicine

## 2013-07-13 ENCOUNTER — Other Ambulatory Visit: Payer: Self-pay

## 2013-07-13 ENCOUNTER — Ambulatory Visit (INDEPENDENT_AMBULATORY_CARE_PROVIDER_SITE_OTHER): Payer: Medicare Other | Admitting: Internal Medicine

## 2013-07-13 VITALS — BP 140/80 | HR 76 | Temp 98.6°F | Resp 16 | Ht 63.5 in | Wt 146.0 lb

## 2013-07-13 DIAGNOSIS — R102 Pelvic and perineal pain: Secondary | ICD-10-CM

## 2013-07-13 DIAGNOSIS — F411 Generalized anxiety disorder: Secondary | ICD-10-CM | POA: Diagnosis not present

## 2013-07-13 DIAGNOSIS — R252 Cramp and spasm: Secondary | ICD-10-CM

## 2013-07-13 DIAGNOSIS — Z23 Encounter for immunization: Secondary | ICD-10-CM

## 2013-07-13 DIAGNOSIS — F419 Anxiety disorder, unspecified: Secondary | ICD-10-CM

## 2013-07-13 DIAGNOSIS — M1612 Unilateral primary osteoarthritis, left hip: Secondary | ICD-10-CM

## 2013-07-13 DIAGNOSIS — K589 Irritable bowel syndrome without diarrhea: Secondary | ICD-10-CM

## 2013-07-13 DIAGNOSIS — Z1231 Encounter for screening mammogram for malignant neoplasm of breast: Secondary | ICD-10-CM

## 2013-07-13 DIAGNOSIS — M169 Osteoarthritis of hip, unspecified: Secondary | ICD-10-CM

## 2013-07-13 LAB — BASIC METABOLIC PANEL
BUN: 15 mg/dL (ref 6–23)
Calcium: 9.3 mg/dL (ref 8.4–10.5)
GFR: 75.69 mL/min (ref >60.00)
Glucose, Bld: 107 mg/dL — ABNORMAL HIGH (ref 70–99)
Potassium: 3.4 mEq/L — ABNORMAL LOW (ref 3.5–5.1)

## 2013-07-13 LAB — MAGNESIUM: Magnesium: 2.1 mg/dL (ref 1.5–2.5)

## 2013-07-13 MED ORDER — METHYLPREDNISOLONE ACETATE 40 MG/ML IJ SUSP: 40.0000 mg | Freq: Once | INTRAMUSCULAR | Status: DC

## 2013-07-13 MED ORDER — ALPRAZOLAM 0.25 MG PO TABS: 0.2500 mg | ORAL_TABLET | Freq: Three times a day (TID) | ORAL | Status: DC | PRN

## 2013-07-13 MED ORDER — DICYCLOMINE HCL 10 MG PO CAPS: 10.0000 mg | ORAL_CAPSULE | Freq: Three times a day (TID) | ORAL | Status: DC

## 2013-07-13 NOTE — Progress Notes (Signed)
  Subjective:    Patient ID: Bianca Fry, female    DOB: 11/02/1945, 67 y.o.   MRN: [id number]  Hypertension Pertinent negatives include no headaches, neck pain or shortness of breath.  Hyperlipidemia Pertinent negatives include no myalgias or shortness of breath.  Gastrophageal Reflux She complains of abdominal pain. She reports no coughing or no wheezing. Pertinent negatives include no fatigue.   The patient has IBS and a history of possible ischemic colitis with abdominal pain and cramping The librax cost is to high and she is seeking an alternative Her anxiety levels are also increased She is keeping hydrated... But has to make herself drink.  Cramps in foot and legs with increased salt this has improved  Review of Systems  Constitutional: Negative for activity change, appetite change and fatigue.  HENT: Negative for ear pain, congestion, neck pain, postnasal drip and sinus pressure.   Eyes: Negative for redness and visual disturbance.  Respiratory: Negative for cough, shortness of breath and wheezing.   Gastrointestinal: Positive for abdominal pain. Negative for abdominal distention.  Genitourinary: Negative for dysuria, frequency and menstrual problem.  Musculoskeletal: Positive for back pain, joint swelling and gait problem. Negative for myalgias and arthralgias.  Skin: Negative for rash and wound.  Neurological: Negative for dizziness, weakness and headaches.  Hematological: Negative for adenopathy. Does not bruise/bleed easily.  Psychiatric/Behavioral: Negative for sleep disturbance and decreased concentration.       Objective:   Physical Exam  Constitutional: She appears well-developed and well-nourished.  HENT:  Head: Normocephalic and atraumatic.  Neck: Normal range of motion. Neck supple.  Cardiovascular: Normal rate and regular rhythm.   Murmur heard. Abdominal: Soft. Bowel sounds are normal.  Musculoskeletal: She exhibits edema and tenderness.  Tender at left  greater hip to compression at the  Greater trochanter          Assessment & Plan:  Leg cramps... Check BMET for potassium ( history of low K) Magnesium may be low hid pian  Informed consent obtained and the patient's left hip was prepped with betadine. Local anesthesia was obtained with topical spray. Then 40 mg of Depo-Medrol and 1/2 cc of lidocaine was injected into the joint space. The patient tolerated the procedure without complications. Post injection care discussed with patient.  Pelvic pain:   GYN referred to urologist Has scheduled appointment with Russella Dar. persistent pelvic pain may be nerve but it is important to rule out structures  May end up with lyrica or neurontin

## 2013-07-17 ENCOUNTER — Ambulatory Visit (INDEPENDENT_AMBULATORY_CARE_PROVIDER_SITE_OTHER): Payer: Medicare Other | Admitting: Gastroenterology

## 2013-07-17 ENCOUNTER — Encounter: Payer: Self-pay | Admitting: Gastroenterology

## 2013-07-17 VITALS — BP 108/74 | HR 80 | Ht 63.5 in | Wt 142.4 lb

## 2013-07-17 DIAGNOSIS — R109 Unspecified abdominal pain: Secondary | ICD-10-CM

## 2013-07-17 DIAGNOSIS — R1030 Lower abdominal pain, unspecified: Secondary | ICD-10-CM

## 2013-07-17 DIAGNOSIS — K589 Irritable bowel syndrome without diarrhea: Secondary | ICD-10-CM | POA: Diagnosis not present

## 2013-07-17 NOTE — Progress Notes (Signed)
History of Present Illness: This is a 67 year old female with a history of irritable bowel syndrome and several episodes of ischemic colitis in the past who relates alternating diarrhea and constipation associated with left lower quadrant discomfort. The symptoms are improved with the use of laxatives and Librax. Her reflux symptoms are well-controlled on omeprazole. She also has a separate suprapubic discomfort that is not related to bowel movements and is not relieved with antispasmodics.  Current Medications, Allergies, Past Medical History, Past Surgical History, Family History and Social History were reviewed in [employer] record.  Physical Exam: General: Well developed , well nourished, no acute distress Head: Normocephalic and atraumatic Eyes:  sclerae anicteric, EOMI Ears: Normal auditory acuity Mouth: No deformity or lesions Lungs: Clear throughout to auscultation Heart: Regular rate and rhythm; no murmurs, rubs or bruits Abdomen: Soft, non tender and non distended. No masses, hepatosplenomegaly or hernias noted. Normal Bowel sounds Musculoskeletal: Symmetrical with no gross deformities  Pulses:  Normal pulses noted Extremities: No clubbing, cyanosis, edema or deformities noted Neurological: Alert oriented x 4, grossly nonfocal Psychological:  Alert and cooperative. Normal mood and affect  Assessment and Recommendations:  1. GERD. Continue omeprazole 40 mg daily.  2. Recurrent ischemic colitis-likely related to diuretic useage and inadequate hydration. She has not had a recurrent episode in over 1 year. I advised her that she should drink at least six 8 oz. glasses of water or juice each day and this fluid total should not include caffeinated, carbonated or alcohol containing beverages. Advised her to avoid all diuretics medications. Advised no more than 2 caffeinated beverages per day.   3. Irritable bowel syndrome. Alternating constipation and loose stools.  Long-term high fiber diet with adequate hydration. Mild laxatives as needed for constipation. Bentyl qid as needed.  4. Suprapubic/pelvic pain. Not related to bowel movements or meals. Not responsive to IBS meds. R/O GU disorder.

## 2013-07-17 NOTE — Patient Instructions (Addendum)
Use your Bentyl as prescribed.   You can use a over the counter mild laxative such as Senakot or Miralax as needed constipation.   Thank you for choosing me and [facility].  Venita Lick. Pleas Koch., MD., Clementeen Graham

## 2013-07-21 ENCOUNTER — Ambulatory Visit
Admission: RE | Admit: 2013-07-21 | Discharge: 2013-07-21 | Disposition: A | Discharge status: Home / Self Care | Payer: Medicare Other | Source: Ambulatory Visit

## 2013-07-21 DIAGNOSIS — Z1231 Encounter for screening mammogram for malignant neoplasm of breast: Secondary | ICD-10-CM | POA: Diagnosis not present

## 2013-07-24 ENCOUNTER — Ambulatory Visit: Payer: Self-pay | Admitting: Obstetrics and Gynecology

## 2013-07-25 ENCOUNTER — Encounter: Payer: Self-pay | Admitting: Obstetrics and Gynecology

## 2013-07-25 ENCOUNTER — Ambulatory Visit (INDEPENDENT_AMBULATORY_CARE_PROVIDER_SITE_OTHER): Payer: Medicare Other | Admitting: Obstetrics and Gynecology

## 2013-07-25 VITALS — BP 134/62 | HR 78 | Resp 12 | Ht 64.5 in | Wt 141.0 lb

## 2013-07-25 DIAGNOSIS — Z124 Encounter for screening for malignant neoplasm of cervix: Secondary | ICD-10-CM

## 2013-07-25 DIAGNOSIS — Z01419 Encounter for gynecological examination (general) (routine) without abnormal findings: Secondary | ICD-10-CM

## 2013-07-25 DIAGNOSIS — R350 Frequency of micturition: Secondary | ICD-10-CM | POA: Diagnosis not present

## 2013-07-25 DIAGNOSIS — R300 Dysuria: Secondary | ICD-10-CM | POA: Diagnosis not present

## 2013-07-25 DIAGNOSIS — Z0000 Encounter for general adult medical examination without abnormal findings: Secondary | ICD-10-CM | POA: Diagnosis not present

## 2013-07-25 DIAGNOSIS — R3915 Urgency of urination: Secondary | ICD-10-CM

## 2013-07-25 LAB — POCT URINALYSIS DIPSTICK
Protein, UA: 3
Urobilinogen, UA: NEGATIVE
pH, UA: 5

## 2013-07-25 NOTE — Patient Instructions (Signed)
Please take the AZO standard 100 mg once daily as needed for bladder pain.   Call [facility] periodically to see if they have cancellations to get your appointment sooner.  We will contact you as your test results become available.

## 2013-07-25 NOTE — Progress Notes (Signed)
GYNECOLOGY VISIT  PCP: Darryll Capers, MD  Referring provider:   HPI: 67 y.o.   Married  Caucasian  female   [id number] with No LMP recorded. Patient has had a hysterectomy.   here for AEX. Dysuria and urgency continues.  The pain causes the urgency. It hurts to void, and voiding does not relieve the pain.  Has taken AZO in the past without problem. Urine culture negative 07/05/13. Takes tylenol which eases the pain.  Saw Dr. Russella Dar, GI, who does not think the abdominal pain is gastrointestinal in origin.  Will see Dr. Burnett Sheng at [facility] on 08/07/13. History of urethral dilations.  Hgb: PCP Urine:  WBC'S 2, RBC'S 2, + Nitrites, Protein 3, P.H. 5.0  GYNECOLOGIC HISTORY: No LMP recorded. Patient has had a hysterectomy. Sexually active:  Yes Partner preference: Female Contraception:  Hysterectomy Menopausal hormone therapy: no DES exposure:  no Blood transfusions:  no Sexually transmitted diseases:   no GYN Procedures:  no Mammogram: 07/21/13                 Pap:  09/05/2009 History of abnormal pap smear:  no   OB History   Grav Para Term Preterm Abortions TAB SAB Ect Mult Living   1 1 1       1        LIFESTYLE: Exercise: walk qd             Tobacco: no Alcohol: no Drug use: no   OTHER HEALTH MAINTENANCE: Tetanus/TDap: 2012 Gardisil:  NA Influenza:  07/13/13 Zostavax: 08/04/10  Bone density: never had one Colonoscopy: 2013- Normal  Cholesterol check:  5/14- Normal  Family History  Problem Relation Age of Onset  . Adopted: Yes  . Colon cancer Neg Hx   . Diabetes Father   . Stroke Father 51  . Hypertension Father   . Heart disease Mother   . Hypertension Mother   . Hyperlipidemia Mother   . Hypertension Sister   . Hypertension Brother   . Seizures Brother   . Hypertension Sister   . Hypertension Brother   . Hypertension Brother   . Hypertension Brother     Patient Active Problem List   Diagnosis Date Noted  . Chest pain 09/05/2012  . Syncope  09/05/2012  . Ischemic colitis 03/01/2012  . ADJUSTMENT DISORDER WITH DEPRESSED MOOD 12/27/2009  . COSTOCHONDRITIS 12/27/2009  . HYPERLIPIDEMIA 12/03/2008  . OTHER SPECIFIED IRON DEFICIENCY ANEMIAS 12/03/2008  . ALLERGIC RHINITIS 07/09/2008  . HYPERTENSION 11/04/2007  . BACK PAIN, LUMBAR, WITH RADICULOPATHY 11/04/2007  . GERD 05/19/2007   Past Medical History  Diagnosis Date  . GERD (gastroesophageal reflux disease)   . Allergy   . Hyperlipidemia   . Arthritis     neck and shoulders, right fingers  . DeQuervain's disease (tenosynovitis) 10/2011    right wrist  . Hypertension     under control; has been on med. since 2009  . Erosive gastritis   . Hiatal hernia   . Internal hemorrhoids   . Colitis   . Anxiety   . Cataract   . Diverticulosis   . Ischemic colitis   . Anemia   . Dyspareunia     Past Surgical History  Procedure Laterality Date  . Laparoscopic lysis intestinal adhesions  1999  . Cholecystectomy  1970  . Appendectomy  1970    at same time as gallbladder  . Abdominal hysterectomy  1985    partial  . Tonsillectomy  1984  . Cataract extraction  2009    right with lens implant  . Lumbar laminectomy/decompression microdiscectomy  11/29/2007; 12/29/2007; 03/15/2008    left L4-5; fusion 5/09 surgery  . Anterior and posterior vaginal repair  12/31/2009    with TVT sling and cysto  . Dorsal compartment release  11/17/2011    Procedure: RELEASE DORSAL COMPARTMENT (DEQUERVAIN);  Surgeon: Wyn Forster., MD;  Location: [facility];  Service: Orthopedics;  Laterality: Right;  First dorsal compartment release  . Colonoscopy  02/16/12    Dr. Claudette Head  . Eyelid surgery  05/12/2012    right    ALLERGIES: Codeine; Nitrofurantoin; Percocet; Propoxyphene and methadone; Vicodin; Flagyl; Clarithromycin; Penicillins; Prednisone; and Sulfonamide derivatives  Current Outpatient Prescriptions  Medication Sig Dispense Refill  . acetaminophen (TYLENOL) 650 MG  CR tablet Take 650 mg by mouth every 8 (eight) hours as needed. For pain      . ALPRAZolam (XANAX) 0.25 MG tablet Take 1 tablet (0.25 mg total) by mouth 3 (three) times daily as needed. For anxiety  60 tablet  5  . aspirin 81 MG tablet Take 81 mg by mouth daily.      Marland Kitchen atorvastatin (LIPITOR) 20 MG tablet Take 20 mg by mouth 2 (two) times a week. On Monday and Friday      . bisoprolol (ZEBETA) 5 MG tablet Take 0.5 tablets (2.5 mg total) by mouth daily.  30 tablet  6  . dicyclomine (BENTYL) 10 MG capsule Take 1 capsule (10 mg total) by mouth 4 (four) times daily -  before meals and at bedtime.  120 capsule  4  . fexofenadine (ALLEGRA) 180 MG tablet Take 180 mg by mouth daily.        Marland Kitchen omeprazole (PRILOSEC) 40 MG capsule Take 40 mg by mouth daily.       No current facility-administered medications for this visit.     ROS:  Pertinent items are noted in HPI.  SOCIAL HISTORY:  Retired.   PHYSICAL EXAMINATION:    There were no vitals taken for this visit.   Wt Readings from Last 3 Encounters:  07/17/13 142 lb 6.4 oz (64.592 kg)  07/13/13 146 lb (66.225 kg)  07/06/13 143 lb (64.864 kg)     Ht Readings from Last 3 Encounters:  07/17/13 5' 3.5" (1.613 m)  07/13/13 5' 3.5" (1.613 m)  07/06/13 5' 3.5" (1.613 m)    General appearance: alert, cooperative and appears stated age Head: Normocephalic, without obvious abnormality, atraumatic Neck: no adenopathy, supple, symmetrical, trachea midline and thyroid not enlarged, symmetric, no tenderness/mass/nodules Lungs: clear to auscultation bilaterally Breasts: Inspection negative, No nipple retraction or dimpling, No nipple discharge or bleeding, No axillary or supraclavicular adenopathy, Normal to palpation without dominant masses Heart: regular rate and rhythm Abdomen: soft, non-tender; no masses,  no organomegaly Extremities: extremities normal, atraumatic, no cyanosis or edema Skin: Skin color, texture, turgor normal. No rashes or  lesions Lymph nodes: Cervical, supraclavicular, and axillary nodes normal. No abnormal inguinal nodes palpated Neurologic: Grossly normal  Pelvic: External genitalia:  no lesions              Urethra:  normal appearing urethra with no masses, tenderness or lesions              Bartholins and Skenes: normal                 Vagina: normal appearing vagina with normal color and discharge.  I palpate a mass to the left of the  midline which feels intraperitoneal or bladder in origin - 1.5 cm, tender, nonmobile.                Cervix:  absent              Pap and high risk HPV testing done: no.            Bimanual Exam:  Uterus:  absent                                      Adnexa:                                        Rectovaginal: Confirms                                      Anus:  normal sphincter tone, no lesions  Catheterization of bladder - consent for procedure. Sterile prep with betadine.  Catheter passed without difficulty.  Cloudy urine noted.  No complications  ASSESSMENT  Pelvic mass - Bladder?  Bowel? Normal pelvic ultrasound. Dysuria. Urgency. Positive urinalysis on urine dip.  PLAN  Mammogram yearly. Pap smear and high risk HPV testing not indicated.  Counseled on  Bone density for osteoporosis screening.  Epic is preventing me from scheduling this for the patient, saying that her Medicare does not accept usual diagnoses in order for it to be scheduled.  Patient informed of this and she will call Medicare to see if she has benefits for this.  Catheter urinalysis and culture for bacteria.  Catheter urine culture for yeast. Catheter urine cytology. OK for daily OTC Pyridium 100 mg once daily prn.  Call made to [facility] to see is she can be seen sooner for her appointment with anyone due to her pain.  They state that nothing is available and that the patient will need to call to see if there are cancellations.  Return annually or prn   An After Visit Summary was  printed and given to the patient.

## 2013-07-25 NOTE — Addendum Note (Signed)
Addended by: Conley Simmonds on: 07/25/2013 01:20 PM   Modules accepted: Orders

## 2013-07-27 LAB — URINALYSIS W MICROSCOPIC + REFLEX CULTURE
Nitrite: NEGATIVE
Protein, ur: 100 mg/dL — AB
pH: 5.5 (ref 5.0–8.0)

## 2013-07-28 LAB — CYTOLOGY, FLUID (SOLSTAS)

## 2013-07-29 ENCOUNTER — Other Ambulatory Visit: Payer: Self-pay | Admitting: Obstetrics and Gynecology

## 2013-07-29 ENCOUNTER — Telehealth: Payer: Self-pay | Admitting: Obstetrics and Gynecology

## 2013-07-29 LAB — URINE CULTURE

## 2013-07-29 MED ORDER — CIPROFLOXACIN HCL 500 MG PO TABS: 500.0000 mg | ORAL_TABLET | Freq: Two times a day (BID) | ORAL | Status: DC

## 2013-07-29 NOTE — Telephone Encounter (Signed)
Phone call to discuss urine culture results and initiate antibiotic therapy. Sensitivities were expected to be back by now, but are not. Patient allergic to PCN, sulfa, and nitrofurantoin.  Will send Rx to pharmacy for Ciprofloxacin 500 mg po bid for 7 days.   Will send My Chart message as well.

## 2013-07-30 ENCOUNTER — Telehealth: Payer: Self-pay | Admitting: Obstetrics and Gynecology

## 2013-07-30 MED ORDER — CEPHALEXIN 500 MG PO CAPS: 500.0000 mg | ORAL_CAPSULE | Freq: Four times a day (QID) | ORAL | Status: DC

## 2013-07-30 NOTE — Telephone Encounter (Signed)
Phone call to patient  I contacted [facility] for the sensitivities which are not available in Epic. Sensitivities to Cefazolin, Nitrofurantoin, and Sulfa.  Patient states allergic reaction to PCn was rash at about age 67 years old.  No shortness or breath or swelling. Allergic reaction to sulfa was significant abdominal cramps.  Will treat with Keflex 500 mg po qid for one week.  I gave patient warning signs of anaphylaxis to watch for and told her she has a 10 percent chance of having an allergic reaction to Keflex.  She will see urology in one week.

## 2013-08-07 DIAGNOSIS — R3129 Other microscopic hematuria: Secondary | ICD-10-CM | POA: Diagnosis not present

## 2013-08-14 DIAGNOSIS — K5730 Diverticulosis of large intestine without perforation or abscess without bleeding: Secondary | ICD-10-CM | POA: Diagnosis not present

## 2013-08-23 LAB — FUNGUS CULTURE W SMEAR: Smear Result: NONE SEEN

## 2013-08-24 DIAGNOSIS — N3020 Other chronic cystitis without hematuria: Secondary | ICD-10-CM | POA: Diagnosis not present

## 2013-08-24 DIAGNOSIS — R3129 Other microscopic hematuria: Secondary | ICD-10-CM | POA: Diagnosis not present

## 2013-09-01 ENCOUNTER — Telehealth: Payer: Self-pay | Admitting: Internal Medicine

## 2013-09-01 MED ORDER — TRAMADOL HCL 50 MG PO TABS: 50.0000 mg | ORAL_TABLET | Freq: Three times a day (TID) | ORAL | Status: DC | PRN

## 2013-09-01 NOTE — Telephone Encounter (Signed)
May refill x1 

## 2013-09-01 NOTE — Telephone Encounter (Addendum)
Pt had previously cracked a rib, and did something the other day that aggravated it. Pt wouldl ike to refill her tramadol 50mg . [facility]/ [address]

## 2013-09-01 NOTE — Telephone Encounter (Signed)
See below and please advise, Thanks!  

## 2013-09-01 NOTE — Telephone Encounter (Signed)
Script called into [employer].

## 2013-09-06 ENCOUNTER — Encounter: Payer: Self-pay | Admitting: Family

## 2013-09-06 ENCOUNTER — Ambulatory Visit (INDEPENDENT_AMBULATORY_CARE_PROVIDER_SITE_OTHER): Payer: Medicare Other | Admitting: Family

## 2013-09-06 VITALS — BP 120/64 | HR 65 | Wt 146.0 lb

## 2013-09-06 DIAGNOSIS — IMO0002 Reserved for concepts with insufficient information to code with codable children: Secondary | ICD-10-CM

## 2013-09-06 DIAGNOSIS — M792 Neuralgia and neuritis, unspecified: Secondary | ICD-10-CM

## 2013-09-06 DIAGNOSIS — M545 Low back pain, unspecified: Secondary | ICD-10-CM

## 2013-09-06 MED ORDER — METHYLPREDNISOLONE ACETATE 40 MG/ML IJ SUSP
80.0000 mg | Freq: Once | INTRAMUSCULAR | Status: AC
  Administered 2013-09-06: 80 mg via INTRAMUSCULAR

## 2013-09-06 MED ORDER — LIDOCAINE 5 % EX PTCH: 1.0000 | MEDICATED_PATCH | CUTANEOUS | Status: DC

## 2013-09-06 NOTE — Progress Notes (Signed)
Subjective:    Patient ID: Bianca Fry, female    DOB: 1946-07-26, 67 y.o.   MRN: [id number]  HPI 67 year old white female, patient of Dr. Lovell Sheehan in today with complaint of right upper back pain that she reports initially having 3 months ago and was treated with a Depo-Medrol injection that helped. She had an x-ray done at the hospital to confirm that she had not had a fracture rib. One week ago, the pain returned. She rates the pain a 10 out of 10, worse at night when she's trying to go to sleep. She describes it as a burning pain that radiates to her right side. Tramadol and effective. Has been taking ibuprofen that helped some. She denies any rash. Had the shingles vaccine in the past.   Review of Systems  Constitutional: Negative.   Respiratory: Negative.   Cardiovascular: Negative.   Genitourinary: Negative.   Musculoskeletal: Positive for back pain.       Right upper back  Skin: Negative.   Neurological: Negative.   Psychiatric/Behavioral: Negative.    Past Medical History  Diagnosis Date  . GERD (gastroesophageal reflux disease)   . Allergy   . Hyperlipidemia   . Arthritis     neck and shoulders, right fingers  . DeQuervain's disease (tenosynovitis) 10/2011    right wrist  . Hypertension     under control; has been on med. since 2009  . Erosive gastritis   . Hiatal hernia   . Internal hemorrhoids   . Colitis   . Anxiety   . Cataract   . Diverticulosis   . Ischemic colitis   . Anemia   . Dyspareunia     History   Social History  . Marital Status: Married    Spouse Name: N/A    Number of Children: 1  . Years of Education: N/A   Occupational History  . Retired    Social History Main Topics  . Smoking status: Never Smoker   . Smokeless tobacco: Never Used  . Alcohol Use: No  . Drug Use: No  . Sexual Activity: Yes    Partners: Male    Birth Control/ Protection: Surgical     Comment: TVH--still has ovaries   Other Topics Concern  . Not on file    Social History Narrative   Daily caffeine     Past Surgical History  Procedure Laterality Date  . Laparoscopic lysis intestinal adhesions  1999  . Cholecystectomy  1970  . Appendectomy  1970    at same time as gallbladder  . Abdominal hysterectomy  1985    partial  . Tonsillectomy  1984  . Cataract extraction  2009    right with lens implant  . Lumbar laminectomy/decompression microdiscectomy  11/29/2007; 12/29/2007; 03/15/2008    left L4-5; fusion 5/09 surgery  . Anterior and posterior vaginal repair  12/31/2009    with TVT sling and cysto  . Dorsal compartment release  11/17/2011    Procedure: RELEASE DORSAL COMPARTMENT (DEQUERVAIN);  Surgeon: Wyn Forster., MD;  Location: [facility];  Service: Orthopedics;  Laterality: Right;  First dorsal compartment release  . Colonoscopy  02/16/12    Dr. Claudette Head  . Eyelid surgery  05/12/2012    right  . Wrist surgery  2013    Sheath was stretched    Family History  Problem Relation Age of Onset  . Adopted: Yes  . Colon cancer Neg Hx   . Diabetes Father   .  Stroke Father 25  . Hypertension Father   . Heart disease Mother   . Hypertension Mother   . Hyperlipidemia Mother   . Hypertension Sister   . Hypertension Brother   . Seizures Brother   . Hypertension Sister   . Hypertension Brother   . Hypertension Brother   . Hypertension Brother     Allergies  Allergen Reactions  . Codeine Other (See Comments)    Chest pain  . Nitrofurantoin Other (See Comments)    Chest pain  . Percocet [Oxycodone-Acetaminophen] Nausea And Vomiting  . Propoxyphene And Methadone Nausea And Vomiting  . Vicodin [Hydrocodone-Acetaminophen] Nausea And Vomiting  . Flagyl [Metronidazole] Diarrhea and Nausea Only  . Clarithromycin Other (See Comments)    Abd. cramps  . Penicillins Rash  . Prednisone Other (See Comments)    UTI  . Sulfonamide Derivatives Other (See Comments)    GI upset    Current Outpatient Prescriptions  on File Prior to Visit  Medication Sig Dispense Refill  . acetaminophen (TYLENOL) 650 MG CR tablet Take 650 mg by mouth every 8 (eight) hours as needed. For pain      . ALPRAZolam (XANAX) 0.25 MG tablet Take 1 tablet (0.25 mg total) by mouth 3 (three) times daily as needed. For anxiety  60 tablet  5  . aspirin 81 MG tablet Take 81 mg by mouth daily.      Marland Kitchen atorvastatin (LIPITOR) 20 MG tablet Take 20 mg by mouth 2 (two) times a week. On Monday and Friday      . bisoprolol (ZEBETA) 5 MG tablet Take 0.5 tablets (2.5 mg total) by mouth daily.  30 tablet  6  . cephALEXin (KEFLEX) 500 MG capsule Take 1 capsule (500 mg total) by mouth 4 (four) times daily. Take QID for 7 days.  28 capsule  0  . CRANBERRY PO Take by mouth.      . dicyclomine (BENTYL) 10 MG capsule Take 1 capsule (10 mg total) by mouth 4 (four) times daily -  before meals and at bedtime.  120 capsule  4  . fexofenadine (ALLEGRA) 180 MG tablet Take 180 mg by mouth daily.        Marland Kitchen omeprazole (PRILOSEC) 40 MG capsule Take 40 mg by mouth daily.      . traMADol (ULTRAM) 50 MG tablet Take 1 tablet (50 mg total) by mouth every 8 (eight) hours as needed.  30 tablet  0  . ciprofloxacin (CIPRO) 500 MG tablet Take 1 tablet (500 mg total) by mouth 2 (two) times daily.  14 tablet  0   No current facility-administered medications on file prior to visit.    BP 120/64  Pulse 65  Wt 146 lb (66.225 kg)chart    Objective:   Physical Exam  Constitutional: She is oriented to person, place, and time. She appears well-developed and well-nourished.  Neck: Normal range of motion. Neck supple.  Cardiovascular: Normal rate, regular rhythm and normal heart sounds.   Pulmonary/Chest: Effort normal and breath sounds normal.  Abdominal: Soft. Bowel sounds are normal.  Musculoskeletal: Normal range of motion. She exhibits tenderness. She exhibits no edema.  Tenderness to light palpation of the right upper back extending laterally. Skin sensitivity.   Neurological: She is alert and oriented to person, place, and time.  Skin: Skin is warm and dry.  Psychiatric: She has a normal mood and affect.          Assessment & Plan:  Assessment: 1 Right back pain  2. Postherpetic neuralgia-probable  Plan: Offered lidocaine patch to be applied to the affected area to help with pain. Depo-Medrol injection given IM x1. Patient upon the office with any questions or concerns. Recheck as scheduled, and as needed.

## 2013-09-06 NOTE — Patient Instructions (Signed)
Postherpetic Neuralgia Shingles is a painful disease. It is caused by the herpes zoster virus. This is the same virus which also causes chickenpox. It can affect the torso, limbs, or the face. For most people, shingles is a condition of rather sudden onset. Pain usually lasts about 1 month. In older patients, or patients with poor immune systems, a painful, long-standing (chronic) condition called postherpetic neuralgia can develop. This condition rarely happens before age 50. But at least 50% of people over 50 become affected following an attack of shingles. There is a natural tendency for this condition to improve over time with no treatment. Less than 5% of patients have pain that lasts for more than 1 year. DIAGNOSIS  Herpes is usually easily diagnosed on physical exam. Pain sometimes follows when the skin sores (lesions) have disappeared. It is called postherpetic neuralgia. That name simply means the pain that follows herpes. TREATMENT   Treating this condition may be difficult. Usually one of the tricyclic antidepressants, often amitriptyline, is the first line of treatment. There is evidence that the sooner these medications are given, the more likely they are to reduce pain.  Conventional analgesics, regional nerve blocks, and anticonvulsants have little benefit in most cases when used alone. Other tricyclic anti-depressants are used as a second option if the first antidepressant is unsuccessful.  Anticonvulsants, including carbamazepine, have been found to provide some added benefit when used with a tricyclic anti-depressant. This is especially for the stabbing type of pain similar to that of trigeminal neuralgia.  Chronic opioid therapy. This is a strong narcotic pain medication. It is used to treat pain that is resistant to other measures. The issues of dependency and tolerance can be reduced with closely managed care.  Some cream treatments are applied locally to the affected area. They  can help when used with other treatments. Their use may be difficult in the case of postherpetic trigeminal neuralgia. This is involved with the face. So the substances can irritate the eye and the skin around the eye. Examples of creams used include Capsaicin and lidocaine creams.  For shingles, antiviral therapies along with analgesics are recommended. Studies of the effect of anti-viral agents such as acyclovir on shingles have been done. They show improved rates of healing and decreased severity of sudden (acute) pain. Some observations suggest that nerve blocks during shingles infection will:  Reduce pain.  Shorten the acute episode.  Prevent the emergence of postherpetic neuralgia. Viral medications used include Acyclovir (Zovirax), Valacyclovir, Famciclovir and a lysine diet. Document Released: 12/26/2002 Document Revised: 12/28/2011 Document Reviewed: 10/05/2005 Exit[employer] Patient Information 2014 [employer].  

## 2013-09-06 NOTE — Progress Notes (Signed)
Pre visit review using our clinic review tool, if applicable. No additional management support is needed unless otherwise documented below in the visit note. 

## 2013-09-11 ENCOUNTER — Telehealth: Payer: Self-pay | Admitting: Internal Medicine

## 2013-09-11 NOTE — Telephone Encounter (Signed)
Talked with pt and ibuprofen helps- will try lidocaine patch and if not better by tomorrow, call and she can be seen

## 2013-09-11 NOTE — Telephone Encounter (Signed)
Patient Information:  Caller Name: Stevi  Phone: [phone]  Patient: Bianca Fry  Gender: Female  DOB: 1946-04-28  Age: 67 Years  PCP: Darryll Capers (Adults only)  Office Follow Up:  Does the office need to follow up with this patient?: Yes  Instructions For The Office: Please contact patient with further instructions.  RN Note:  Patient would like to know if she needs to see the doctor again regarding this pain. She has not tried the lidoderm patch yet. Please contact patient to give further instructions.  Symptoms  Reason For Call & Symptoms: Pain to the right shoulder blade which radiates to the front ribcage area.  Reviewed Health History In EMR: Yes  Reviewed Medications In EMR: Yes  Reviewed Allergies In EMR: Yes  Reviewed Surgeries / Procedures: Yes  Date of Onset of Symptoms: 08/29/2013  Treatments Tried: Ibuprofen  Treatments Tried Worked: Yes  Guideline(s) Used:  Shoulder Pain  Disposition Per Guideline:   Go to ED Now  Reason For Disposition Reached:   Age > 40 and no obvious cause and pain even when not moving the arm (Exception: pain is clearly made worse by moving arm or bending neck)  Advice Given:  N/A  Patient Refused Recommendation:  Patient Refused Care Advice  Would like to know if she needs to be seen again or if she should try something else at home. Please contact patient with further instructions.

## 2013-09-13 ENCOUNTER — Ambulatory Visit (INDEPENDENT_AMBULATORY_CARE_PROVIDER_SITE_OTHER)
Admission: RE | Admit: 2013-09-13 | Discharge: 2013-09-13 | Disposition: A | Discharge status: Home / Self Care | Payer: Medicare Other | Source: Ambulatory Visit | Attending: Family | Admitting: Family

## 2013-09-13 ENCOUNTER — Ambulatory Visit (INDEPENDENT_AMBULATORY_CARE_PROVIDER_SITE_OTHER): Payer: Medicare Other | Admitting: Family

## 2013-09-13 ENCOUNTER — Telehealth: Payer: Self-pay | Admitting: Internal Medicine

## 2013-09-13 ENCOUNTER — Encounter: Payer: Self-pay | Admitting: Family

## 2013-09-13 VITALS — BP 132/80 | HR 82 | Wt 146.0 lb

## 2013-09-13 DIAGNOSIS — S298XXA Other specified injuries of thorax, initial encounter: Secondary | ICD-10-CM

## 2013-09-13 DIAGNOSIS — IMO0002 Reserved for concepts with insufficient information to code with codable children: Secondary | ICD-10-CM | POA: Diagnosis not present

## 2013-09-13 DIAGNOSIS — S299XXA Unspecified injury of thorax, initial encounter: Secondary | ICD-10-CM

## 2013-09-13 DIAGNOSIS — M546 Pain in thoracic spine: Secondary | ICD-10-CM

## 2013-09-13 MED ORDER — METHYLPREDNISOLONE 4 MG PO KIT: PACK | ORAL | Status: AC

## 2013-09-13 NOTE — Telephone Encounter (Signed)
Pt states she is still having the pain under shoulder blade and through the breast. Instructed to call back for appt if not better. She is not. Pain patches not working. Would like to see padonda today b/c she doesn't want to keep seeing another doc.

## 2013-09-13 NOTE — Progress Notes (Signed)
Pre visit review using our clinic review tool, if applicable. No additional management support is needed unless otherwise documented below in the visit note. 

## 2013-09-13 NOTE — Telephone Encounter (Signed)
Pt has been sch

## 2013-09-13 NOTE — Telephone Encounter (Signed)
Have her come in to be seen at 2pm

## 2013-09-13 NOTE — Patient Instructions (Signed)
Back Pain, Adult Low back pain is very common. About 1 in 5 people have back pain.The cause of low back pain is rarely dangerous. The pain often gets better over time.About half of people with a sudden onset of back pain feel better in just 2 weeks. About 8 in 10 people feel better by 6 weeks.  CAUSES Some common causes of back pain include:  Strain of the muscles or ligaments supporting the spine.  Wear and tear (degeneration) of the spinal discs.  Arthritis.  Direct injury to the back. DIAGNOSIS Most of the time, the direct cause of low back pain is not known.However, back pain can be treated effectively even when the exact cause of the pain is unknown.Answering your caregiver's questions about your overall health and symptoms is one of the most accurate ways to make sure the cause of your pain is not dangerous. If your caregiver needs more information, he or she may order lab work or imaging tests (X-rays or MRIs).However, even if imaging tests show changes in your back, this usually does not require surgery. HOME CARE INSTRUCTIONS For many people, back pain returns.Since low back pain is rarely dangerous, it is often a condition that people can learn to manageon their own.   Remain active. It is stressful on the back to sit or stand in one place. Do not sit, drive, or stand in one place for more than 30 minutes at a time. Take short walks on level surfaces as soon as pain allows.Try to increase the length of time you walk each day.  Do not stay in bed.Resting more than 1 or 2 days can delay your recovery.  Do not avoid exercise or work.Your body is made to move.It is not dangerous to be active, even though your back may hurt.Your back will likely heal faster if you return to being active before your pain is gone.  Pay attention to your body when you bend and lift. Many people have less discomfortwhen lifting if they bend their knees, keep the load close to their bodies,and  avoid twisting. Often, the most comfortable positions are those that put less stress on your recovering back.  Find a comfortable position to sleep. Use a firm mattress and lie on your side with your knees slightly bent. If you lie on your back, put a pillow under your knees.  Only take over-the-counter or prescription medicines as directed by your caregiver. Over-the-counter medicines to reduce pain and inflammation are often the most helpful.Your caregiver may prescribe muscle relaxant drugs.These medicines help dull your pain so you can more quickly return to your normal activities and healthy exercise.  Put ice on the injured area.  Put ice in a plastic bag.  Place a towel between your skin and the bag.  Leave the ice on for 15-20 minutes, 03-04 times a day for the first 2 to 3 days. After that, ice and heat may be alternated to reduce pain and spasms.  Ask your caregiver about trying back exercises and gentle massage. This may be of some benefit.  Avoid feeling anxious or stressed.Stress increases muscle tension and can worsen back pain.It is important to recognize when you are anxious or stressed and learn ways to manage it.Exercise is a great option. SEEK MEDICAL CARE IF:  You have pain that is not relieved with rest or medicine.  You have pain that does not improve in 1 week.  You have new symptoms.  You are generally not feeling well. SEEK   IMMEDIATE MEDICAL CARE IF:   You have pain that radiates from your back into your legs.  You develop new bowel or bladder control problems.  You have unusual weakness or numbness in your arms or legs.  You develop nausea or vomiting.  You develop abdominal pain.  You feel faint. Document Released: 10/05/2005 Document Revised: 04/05/2012 Document Reviewed: 02/23/2011 [employer] Patient Information 2014 [employer].  

## 2013-09-13 NOTE — Progress Notes (Signed)
Subjective:    Patient ID: Bianca Fry, female    DOB: 1946-02-12, 67 y.o.   MRN: [id number]  HPI  67 year old white female, nonsmoker, who sent Dr. Lovell Sheehan in today with persistent right back pain that radiates from the spine around to her right side. The pain is particularly worse at night. She takes ibuprofen that helps. She has also been using Lidoderm patches. Pain at its worst is a 10 out of 10, but with medication is a 3-4/10. It is tender to palpation. No pain with inspiration or expiration. He describes it as sharp. She was seen a few weeks ago for similar symptoms. Patient recalls up and her husband pick up firewood that may have exacerbated her symptoms. No rash.  Review of Systems  Constitutional: Negative.   Respiratory: Negative.   Cardiovascular: Negative.   Gastrointestinal: Negative.   Musculoskeletal: Positive for arthralgias and back pain.  Skin: Negative.   Neurological: Negative.   Psychiatric/Behavioral: Negative.    Past Medical History  Diagnosis Date  . GERD (gastroesophageal reflux disease)   . Allergy   . Hyperlipidemia   . Arthritis     neck and shoulders, right fingers  . DeQuervain's disease (tenosynovitis) 10/2011    right wrist  . Hypertension     under control; has been on med. since 2009  . Erosive gastritis   . Hiatal hernia   . Internal hemorrhoids   . Colitis   . Anxiety   . Cataract   . Diverticulosis   . Ischemic colitis   . Anemia   . Dyspareunia     History   Social History  . Marital Status: Married    Spouse Name: N/A    Number of Children: 1  . Years of Education: N/A   Occupational History  . Retired    Social History Main Topics  . Smoking status: Never Smoker   . Smokeless tobacco: Never Used  . Alcohol Use: No  . Drug Use: No  . Sexual Activity: Yes    Partners: Male    Birth Control/ Protection: Surgical     Comment: TVH--still has ovaries   Other Topics Concern  . Not on file   Social History  Narrative   Daily caffeine     Past Surgical History  Procedure Laterality Date  . Laparoscopic lysis intestinal adhesions  1999  . Cholecystectomy  1970  . Appendectomy  1970    at same time as gallbladder  . Abdominal hysterectomy  1985    partial  . Tonsillectomy  1984  . Cataract extraction  2009    right with lens implant  . Lumbar laminectomy/decompression microdiscectomy  11/29/2007; 12/29/2007; 03/15/2008    left L4-5; fusion 5/09 surgery  . Anterior and posterior vaginal repair  12/31/2009    with TVT sling and cysto  . Dorsal compartment release  11/17/2011    Procedure: RELEASE DORSAL COMPARTMENT (DEQUERVAIN);  Surgeon: Wyn Forster., MD;  Location: [facility];  Service: Orthopedics;  Laterality: Right;  First dorsal compartment release  . Colonoscopy  02/16/12    Dr. Claudette Head  . Eyelid surgery  05/12/2012    right  . Wrist surgery  2013    Sheath was stretched    Family History  Problem Relation Age of Onset  . Adopted: Yes  . Colon cancer Neg Hx   . Diabetes Father   . Stroke Father 67  . Hypertension Father   . Heart disease Mother   .  Hypertension Mother   . Hyperlipidemia Mother   . Hypertension Sister   . Hypertension Brother   . Seizures Brother   . Hypertension Sister   . Hypertension Brother   . Hypertension Brother   . Hypertension Brother     Allergies  Allergen Reactions  . Codeine Other (See Comments)    Chest pain  . Nitrofurantoin Other (See Comments)    Chest pain  . Percocet [Oxycodone-Acetaminophen] Nausea And Vomiting  . Propoxyphene And Methadone Nausea And Vomiting  . Vicodin [Hydrocodone-Acetaminophen] Nausea And Vomiting  . Flagyl [Metronidazole] Diarrhea and Nausea Only  . Clarithromycin Other (See Comments)    Abd. cramps  . Penicillins Rash  . Prednisone Other (See Comments)    UTI  . Sulfonamide Derivatives Other (See Comments)    GI upset    Current Outpatient Prescriptions on File Prior to  Visit  Medication Sig Dispense Refill  . acetaminophen (TYLENOL) 650 MG CR tablet Take 650 mg by mouth every 8 (eight) hours as needed. For pain      . ALPRAZolam (XANAX) 0.25 MG tablet Take 1 tablet (0.25 mg total) by mouth 3 (three) times daily as needed. For anxiety  60 tablet  5  . aspirin 81 MG tablet Take 81 mg by mouth daily.      Marland Kitchen atorvastatin (LIPITOR) 20 MG tablet Take 20 mg by mouth 2 (two) times a week. On Monday and Friday      . bisoprolol (ZEBETA) 5 MG tablet Take 0.5 tablets (2.5 mg total) by mouth daily.  30 tablet  6  . cephALEXin (KEFLEX) 500 MG capsule Take 1 capsule (500 mg total) by mouth 4 (four) times daily. Take QID for 7 days.  28 capsule  0  . ciprofloxacin (CIPRO) 500 MG tablet Take 1 tablet (500 mg total) by mouth 2 (two) times daily.  14 tablet  0  . CRANBERRY PO Take by mouth.      . dicyclomine (BENTYL) 10 MG capsule Take 1 capsule (10 mg total) by mouth 4 (four) times daily -  before meals and at bedtime.  120 capsule  4  . fexofenadine (ALLEGRA) 180 MG tablet Take 180 mg by mouth daily.        Marland Kitchen lidocaine (LIDODERM) 5 % Place 1 patch onto the skin daily. Remove & Discard patch within 12 hours or as directed by MD  30 patch  0  . omeprazole (PRILOSEC) 40 MG capsule Take 40 mg by mouth daily.      . traMADol (ULTRAM) 50 MG tablet Take 1 tablet (50 mg total) by mouth every 8 (eight) hours as needed.  30 tablet  0   No current facility-administered medications on file prior to visit.    BP 132/80  Pulse 82  Wt 146 lb (66.225 kg)chart    Objective:   Physical Exam  Constitutional: She is oriented to person, place, and time. She appears well-developed and well-nourished.  Neck: Normal range of motion. Neck supple.  Cardiovascular: Normal rate, regular rhythm and normal heart sounds.   Pulmonary/Chest: Effort normal and breath sounds normal.  Musculoskeletal: She exhibits tenderness. She exhibits no edema.  Tenderness to palpation to the right t-spine. No  rash. No pain with movement.   Neurological: She is alert and oriented to person, place, and time.  Skin: Skin is warm and dry.  Psychiatric: She has a normal mood and affect.          Assessment & Plan:  Assessment:  1. Thoracic back pain 2. Thoracic back injury  Plan: Prednisone taper as directed. Patient the office if symptoms worsen or persist. X-ray of the T-spine will notify patient of results. Consider physical therapy.

## 2013-09-19 ENCOUNTER — Emergency Department (HOSPITAL_COMMUNITY)
Admission: EM | Admit: 2013-09-19 | Discharge: 2013-09-20 | Disposition: A | Discharge status: Home / Self Care | Payer: Medicare Other | Attending: Emergency Medicine | Admitting: Emergency Medicine

## 2013-09-19 ENCOUNTER — Encounter (HOSPITAL_COMMUNITY): Payer: Self-pay | Admitting: Emergency Medicine

## 2013-09-19 ENCOUNTER — Emergency Department (HOSPITAL_COMMUNITY): Payer: Medicare Other

## 2013-09-19 DIAGNOSIS — Z885 Allergy status to narcotic agent status: Secondary | ICD-10-CM | POA: Diagnosis not present

## 2013-09-19 DIAGNOSIS — E785 Hyperlipidemia, unspecified: Secondary | ICD-10-CM | POA: Insufficient documentation

## 2013-09-19 DIAGNOSIS — M129 Arthropathy, unspecified: Secondary | ICD-10-CM | POA: Insufficient documentation

## 2013-09-19 DIAGNOSIS — K219 Gastro-esophageal reflux disease without esophagitis: Secondary | ICD-10-CM | POA: Diagnosis not present

## 2013-09-19 DIAGNOSIS — D649 Anemia, unspecified: Secondary | ICD-10-CM | POA: Insufficient documentation

## 2013-09-19 DIAGNOSIS — Z7982 Long term (current) use of aspirin: Secondary | ICD-10-CM | POA: Diagnosis not present

## 2013-09-19 DIAGNOSIS — R071 Chest pain on breathing: Secondary | ICD-10-CM | POA: Insufficient documentation

## 2013-09-19 DIAGNOSIS — R0789 Other chest pain: Secondary | ICD-10-CM | POA: Diagnosis not present

## 2013-09-19 DIAGNOSIS — Z8719 Personal history of other diseases of the digestive system: Secondary | ICD-10-CM | POA: Diagnosis not present

## 2013-09-19 DIAGNOSIS — F411 Generalized anxiety disorder: Secondary | ICD-10-CM | POA: Diagnosis not present

## 2013-09-19 DIAGNOSIS — Z888 Allergy status to other drugs, medicaments and biological substances status: Secondary | ICD-10-CM | POA: Diagnosis not present

## 2013-09-19 DIAGNOSIS — Z9109 Other allergy status, other than to drugs and biological substances: Secondary | ICD-10-CM | POA: Insufficient documentation

## 2013-09-19 DIAGNOSIS — Z9849 Cataract extraction status, unspecified eye: Secondary | ICD-10-CM | POA: Insufficient documentation

## 2013-09-19 DIAGNOSIS — Z882 Allergy status to sulfonamides status: Secondary | ICD-10-CM | POA: Insufficient documentation

## 2013-09-19 DIAGNOSIS — R079 Chest pain, unspecified: Secondary | ICD-10-CM

## 2013-09-19 DIAGNOSIS — Z79899 Other long term (current) drug therapy: Secondary | ICD-10-CM | POA: Diagnosis not present

## 2013-09-19 DIAGNOSIS — Z880 Allergy status to penicillin: Secondary | ICD-10-CM | POA: Insufficient documentation

## 2013-09-19 DIAGNOSIS — I10 Essential (primary) hypertension: Secondary | ICD-10-CM | POA: Insufficient documentation

## 2013-09-19 DIAGNOSIS — R110 Nausea: Secondary | ICD-10-CM | POA: Diagnosis not present

## 2013-09-19 DIAGNOSIS — Z8742 Personal history of other diseases of the female genital tract: Secondary | ICD-10-CM | POA: Insufficient documentation

## 2013-09-19 LAB — POCT I-STAT TROPONIN I: Troponin i, poc: 0 ng/mL (ref 0.00–0.08)

## 2013-09-19 LAB — BASIC METABOLIC PANEL
BUN: 22 mg/dL (ref 6–23)
Chloride: 101 mEq/L (ref 96–112)
GFR calc non Af Amer: 73 mL/min — ABNORMAL LOW (ref >90)
Glucose, Bld: 109 mg/dL — ABNORMAL HIGH (ref 70–99)
Potassium: 3.6 mEq/L (ref 3.5–5.1)
Sodium: 138 mEq/L (ref 135–145)

## 2013-09-19 LAB — CBC
HCT: 36.3 % (ref 36.0–46.0)
Hemoglobin: 12.2 g/dL (ref 12.0–15.0)
MCHC: 33.6 g/dL (ref 30.0–36.0)
RBC: 4.27 MIL/uL (ref 3.87–5.11)
WBC: 11.3 10*3/uL — ABNORMAL HIGH (ref 4.0–10.5)

## 2013-09-19 NOTE — ED Notes (Signed)
Patient transported to X-ray 

## 2013-09-19 NOTE — ED Provider Notes (Signed)
CSN: [id number]     Arrival date & time 09/19/13  2037 History   First MD Initiated Contact with Patient 09/19/13 2145     Chief Complaint  Patient presents with  . Chest Pain   (Consider location/radiation/quality/duration/timing/severity/associated sxs/prior Treatment) HPI Bianca Fry is a 67 y.o. female who presents to the emergency department with R sided chest wall pain.  Patient reports that she had a mild fracture of a rib several months ago after a fall.  She was having some residual pain with this but she was given a cortisone shot and this resolved.  Then a month ago, she developed R sided pain in the same spot as the initial fx in the past.  Moderate in severity.  Pleuritic.  Worse with palpation in this one spot.  Seen other physicians for this and tried pain patches, motrin, tylenol, and Ultram all without significant improvement.  Then today, she reports that she got very anxious about it.  She had some tightness in her chest that was associated with nausea.  This episode did not really bother her as much as the lateral pain and she reports that this only lasted a few seconds before resolving.  She took a Xanax and this helped.  Neither pain is exertional and she can walk for >1 mile without these pains.  No SOB.  No palpitations.  No leg swelling.  No hemoptysis.  Not ripping or tearing.  No other symptoms.   Past Medical History  Diagnosis Date  . GERD (gastroesophageal reflux disease)   . Allergy   . Hyperlipidemia   . Arthritis     neck and shoulders, right fingers  . DeQuervain's disease (tenosynovitis) 10/2011    right wrist  . Hypertension     under control; has been on med. since 2009  . Erosive gastritis   . Hiatal hernia   . Internal hemorrhoids   . Colitis   . Anxiety   . Cataract   . Diverticulosis   . Ischemic colitis   . Anemia   . Dyspareunia    Past Surgical History  Procedure Laterality Date  . Laparoscopic lysis intestinal adhesions  1999  .  Cholecystectomy  1970  . Appendectomy  1970    at same time as gallbladder  . Abdominal hysterectomy  1985    partial  . Tonsillectomy  1984  . Cataract extraction  2009    right with lens implant  . Lumbar laminectomy/decompression microdiscectomy  11/29/2007; 12/29/2007; 03/15/2008    left L4-5; fusion 5/09 surgery  . Anterior and posterior vaginal repair  12/31/2009    with TVT sling and cysto  . Dorsal compartment release  11/17/2011    Procedure: RELEASE DORSAL COMPARTMENT (DEQUERVAIN);  Surgeon: Wyn Forster., MD;  Location: [facility];  Service: Orthopedics;  Laterality: Right;  First dorsal compartment release  . Colonoscopy  02/16/12    Dr. Claudette Head  . Eyelid surgery  05/12/2012    right  . Wrist surgery  2013    Sheath was stretched   Family History  Problem Relation Age of Onset  . Adopted: Yes  . Colon cancer Neg Hx   . Diabetes Father   . Stroke Father 20  . Hypertension Father   . Heart disease Mother   . Hypertension Mother   . Hyperlipidemia Mother   . Hypertension Sister   . Hypertension Brother   . Seizures Brother   . Hypertension Sister   .  Hypertension Brother   . Hypertension Brother   . Hypertension Brother    History  Substance Use Topics  . Smoking status: Never Smoker   . Smokeless tobacco: Never Used  . Alcohol Use: No   OB History   Grav Para Term Preterm Abortions TAB SAB Ect Mult Living   1 1 1       1      Review of Systems  Constitutional: Negative for fever and chills.  HENT: Negative for congestion and rhinorrhea.   Respiratory: Negative for cough and shortness of breath.   Cardiovascular: Positive for chest pain. Negative for palpitations and leg swelling.  Gastrointestinal: Negative for nausea, vomiting, abdominal pain, diarrhea and abdominal distention.  Endocrine: Negative for polyuria.  Genitourinary: Negative for dysuria.  Musculoskeletal: Negative for neck pain and neck stiffness.  Skin: Negative for  rash.  Neurological: Negative for headaches.  Psychiatric/Behavioral: Negative.     Allergies  Codeine; Nitrofurantoin; Percocet; Propoxyphene and methadone; Vicodin; Flagyl; Clarithromycin; Penicillins; Prednisone; and Sulfonamide derivatives  Home Medications   Current Outpatient Rx  Name  Route  Sig  Dispense  Refill  . acetaminophen (TYLENOL) 650 MG CR tablet   Oral   Take 650 mg by mouth every 8 (eight) hours as needed. For pain         . ALPRAZolam (XANAX) 0.25 MG tablet   Oral   Take 1 tablet (0.25 mg total) by mouth 3 (three) times daily as needed. For anxiety   60 tablet   5   . aspirin 81 MG tablet   Oral   Take 81 mg by mouth daily.         Marland Kitchen atorvastatin (LIPITOR) 20 MG tablet   Oral   Take 20 mg by mouth 2 (two) times a week. On Monday and Friday         . bisoprolol (ZEBETA) 5 MG tablet   Oral   Take 0.5 tablets (2.5 mg total) by mouth daily.   30 tablet   6   . cephALEXin (KEFLEX) 500 MG capsule   Oral   Take 1 capsule (500 mg total) by mouth 4 (four) times daily. Take QID for 7 days.   28 capsule   0   . ciprofloxacin (CIPRO) 500 MG tablet   Oral   Take 1 tablet (500 mg total) by mouth 2 (two) times daily.   14 tablet   0   . CRANBERRY PO   Oral   Take by mouth.         . dicyclomine (BENTYL) 10 MG capsule   Oral   Take 1 capsule (10 mg total) by mouth 4 (four) times daily -  before meals and at bedtime.   120 capsule   4   . fexofenadine (ALLEGRA) 180 MG tablet   Oral   Take 180 mg by mouth daily.           Marland Kitchen lidocaine (LIDODERM) 5 %   Transdermal   Place 1 patch onto the skin daily. Remove & Discard patch within 12 hours or as directed by MD   30 patch   0   . methylPREDNISolone (MEDROL DOSEPAK) 4 MG tablet      follow package directions   21 tablet   0   . omeprazole (PRILOSEC) 40 MG capsule   Oral   Take 40 mg by mouth daily.         . traMADol (ULTRAM) 50 MG tablet   Oral  Take 1 tablet (50 mg total) by  mouth every 8 (eight) hours as needed.   30 tablet   0    BP 146/65  Pulse 64  Temp(Src) 97.3 F (36.3 C) (Oral)  Resp 20  SpO2 99% Physical Exam  Nursing note and vitals reviewed. Constitutional: She is oriented to person, place, and time. She appears well-developed and well-nourished. No distress.  HENT:  Head: Normocephalic and atraumatic.  Right Ear: External ear normal.  Left Ear: External ear normal.  Nose: Nose normal.  Mouth/Throat: Oropharynx is clear and moist. No oropharyngeal exudate.  Eyes: EOM are normal. Pupils are equal, round, and reactive to light.  Neck: Normal range of motion. Neck supple. No tracheal deviation present.  Cardiovascular: Normal rate.   Pulmonary/Chest: Effort normal and breath sounds normal. No stridor. No respiratory distress. She has no wheezes. She has no rales.  Point tender in R side midaxillary line, just above level of inframammary crease.  Abdominal: Soft. She exhibits no distension. There is no tenderness. There is no rebound.  Musculoskeletal: Normal range of motion.  Neurological: She is alert and oriented to person, place, and time.  Skin: Skin is warm and dry. She is not diaphoretic.    ED Course  Procedures (including critical care time) Labs Review Labs Reviewed  CBC - Abnormal; Notable for the following:    WBC 11.3 (*)    All other components within normal limits  BASIC METABOLIC PANEL - Abnormal; Notable for the following:    Glucose, Bld 109 (*)    GFR calc non Af Amer 73 (*)    GFR calc Af Amer 85 (*)    All other components within normal limits  POCT I-STAT TROPONIN I   Imaging Review Dg Ribs Unilateral W/chest Right  09/19/2013   CLINICAL DATA:  Pain in the right mid and posterior ribs for weeks.  EXAM: RIGHT RIBS AND CHEST - 3+ VIEW  COMPARISON:  03/14/2013  FINDINGS: Heart size is normal. The lungs are free of focal consolidations and pleural effusions. No evidence for pneumothorax. Oblique views of the ribs  show no acute fractures. No suspicious lytic or blastic lesions are identified.  IMPRESSION: Negative.   Electronically Signed   By: Rosalie Gums M.D.   On: 09/19/2013 21:58    EKG Interpretation    Date/Time:  Tuesday September 19 2013 20:50:33 EST Ventricular Rate:  74 PR Interval:  108 QRS Duration: 92 QT Interval:  374 QTC Calculation: 415 R Axis:   66 Text Interpretation:  Sinus rhythm with short PR Otherwise normal ECG improved compared to prior.  Confirmed by [facility]  MD, TREY ([id number]) on 09/19/2013 10:01:05 PM            MDM   1. Chest pain    Dominic Rhome is a 67 y.o. female who presents to the emergency department with R lateral chest pain and a few second episode of tightness in chest that occurred with anxiety earlier today.  Both are very atypical for ACS.  Delta troponin checked and negative with significant delay from onset of symptoms to presentation.  EKG with no ischemic changes.  Very atypical story for PE or dissection.  Lateral chest wall likely mild inflammation/scarring from remote trauma.  Recommend NSAIDs and f/u with PCP.  Patient safe for discharge.  Patient discharged.    Arloa Koh, MD 09/20/13 [phone]

## 2013-09-19 NOTE — ED Notes (Signed)
Patient is resting with family at bedside  

## 2013-09-19 NOTE — ED Notes (Signed)
Pt. reports right lateral ribcage pain radiating to right anterior chest for several days , denies recent injury , pt. stated she fractured her right rib last Sept. 2014 . Respirations unlabored / no nausea or diaphoresis .

## 2013-09-19 NOTE — ED Provider Notes (Signed)
I saw and evaluated the patient, reviewed the resident's note and I agree with the findings and plan.  EKG Interpretation    Date/Time:  Tuesday September 19 2013 20:50:33 EST Ventricular Rate:  74 PR Interval:  108 QRS Duration: 92 QT Interval:  374 QTC Calculation: 415 R Axis:   66 Text Interpretation:  Sinus rhythm with short PR Otherwise normal ECG improved compared to prior.  Confirmed by [facility]  MD, TREY [phone]) on 09/19/2013 10:01:05 PM            67 year old female with recurrent right-sided chest wall pain. She thinks it began after a hairline fracture of her rib sometime in the past. She's been evaluated by her primary physician regarding this pain. She also endorses a brief seconds to minutes long episode of different chest tightness and nausea which resolved after having a bowel movement and taking a Xanax.  No shortness of breath, no diaphoresis, no dizziness. This happened several hours prior to arrival is atypical for ACS. She thinks the symptoms were secondary to anxiety. Her EKG is unchanged and troponins negative. On exam, right-sided chest wall is tender to palpation. Lungs clear to auscultation. Plan for continued outpatient treatment and close PCP followup  Clinical Impression: 1. Chest pain       Candyce Churn, MD 09/20/13 [phone]

## 2013-09-20 ENCOUNTER — Other Ambulatory Visit: Payer: Self-pay | Admitting: Family

## 2013-09-20 ENCOUNTER — Telehealth: Payer: Self-pay | Admitting: Internal Medicine

## 2013-09-20 DIAGNOSIS — M546 Pain in thoracic spine: Secondary | ICD-10-CM

## 2013-09-20 LAB — POCT I-STAT TROPONIN I: Troponin i, poc: 0 ng/mL (ref 0.00–0.08)

## 2013-09-20 NOTE — ED Notes (Signed)
Patient is alert and orientedx4.  Patient was explained discharge instructions and they understood them with no questions.  Julien Girt, the patient's husband is taking her home.

## 2013-09-20 NOTE — ED Notes (Signed)
Patient is resting comfortably, with family at the bedside. 

## 2013-09-20 NOTE — Telephone Encounter (Signed)
Patient Information:  Caller Name: Sheilyn  Phone: [phone]  Patient: Bianca Fry  Gender: Female  DOB: 05-29-1946  Age: 67 Years  PCP: Darryll Capers (Adults only)  Office Follow Up:  Does the office need to follow up with this patient?: No  Instructions For The Office: N/A   Symptoms  Reason For Call & Symptoms: Pt is calling for a f/u appt after being seen at the ED. These calls are handled by the office. RN transferred caller to [place] at the office to schedule.  Reviewed Health History In EMR: N/A  Reviewed Medications In EMR: N/A  Reviewed Allergies In EMR: N/A  Reviewed Surgeries / Procedures: N/A  Date of Onset of Symptoms: 09/20/2013  Guideline(s) Used:  No Protocol Available - Information Only  Disposition Per Guideline:   Discuss with PCP and Callback by Nurse Today  Reason For Disposition Reached:   Nursing judgment  Advice Given:  Call Back If:  New symptoms develop  You become worse.  Patient Will Follow Care Advice:  YES

## 2013-09-20 NOTE — ED Provider Notes (Signed)
I saw and evaluated the patient, reviewed the resident's note and I agree with the findings and plan.  EKG Interpretation    Date/Time:  Tuesday September 19 2013 20:50:33 EST Ventricular Rate:  74 PR Interval:  108 QRS Duration: 92 QT Interval:  374 QTC Calculation: 415 R Axis:   66 Text Interpretation:  Sinus rhythm with short PR Otherwise normal ECG improved compared to prior.  Confirmed by [facility]  MD, TREY [phone]) on 09/19/2013 10:01:05 PM              Candyce Churn, MD 09/20/13 [phone]

## 2013-09-26 ENCOUNTER — Ambulatory Visit: Payer: Medicare Other | Admitting: Family

## 2013-09-26 DIAGNOSIS — M546 Pain in thoracic spine: Secondary | ICD-10-CM | POA: Diagnosis not present

## 2013-09-26 DIAGNOSIS — M47814 Spondylosis without myelopathy or radiculopathy, thoracic region: Secondary | ICD-10-CM | POA: Diagnosis not present

## 2013-10-01 ENCOUNTER — Other Ambulatory Visit: Payer: Self-pay | Admitting: Internal Medicine

## 2013-10-06 DIAGNOSIS — N952 Postmenopausal atrophic vaginitis: Secondary | ICD-10-CM | POA: Diagnosis not present

## 2013-10-06 DIAGNOSIS — N3020 Other chronic cystitis without hematuria: Secondary | ICD-10-CM | POA: Diagnosis not present

## 2013-10-13 ENCOUNTER — Ambulatory Visit: Payer: Self-pay | Admitting: Orthopedic Surgery

## 2013-10-13 DIAGNOSIS — M546 Pain in thoracic spine: Secondary | ICD-10-CM | POA: Diagnosis not present

## 2013-10-17 DIAGNOSIS — M546 Pain in thoracic spine: Secondary | ICD-10-CM | POA: Diagnosis not present

## 2013-10-17 DIAGNOSIS — N3020 Other chronic cystitis without hematuria: Secondary | ICD-10-CM | POA: Diagnosis not present

## 2013-10-17 DIAGNOSIS — N952 Postmenopausal atrophic vaginitis: Secondary | ICD-10-CM | POA: Diagnosis not present

## 2013-10-17 DIAGNOSIS — R3129 Other microscopic hematuria: Secondary | ICD-10-CM | POA: Diagnosis not present

## 2013-10-17 DIAGNOSIS — N390 Urinary tract infection, site not specified: Secondary | ICD-10-CM | POA: Diagnosis not present

## 2013-10-19 DIAGNOSIS — N390 Urinary tract infection, site not specified: Secondary | ICD-10-CM

## 2013-10-19 DIAGNOSIS — M5432 Sciatica, left side: Secondary | ICD-10-CM

## 2013-10-19 HISTORY — DX: Urinary tract infection, site not specified: N39.0

## 2013-10-19 HISTORY — DX: Sciatica, left side: M54.32

## 2013-10-25 ENCOUNTER — Other Ambulatory Visit: Payer: Self-pay | Admitting: Internal Medicine

## 2013-10-26 DIAGNOSIS — H2510 Age-related nuclear cataract, unspecified eye: Secondary | ICD-10-CM | POA: Diagnosis not present

## 2013-10-27 DIAGNOSIS — N3020 Other chronic cystitis without hematuria: Secondary | ICD-10-CM | POA: Diagnosis not present

## 2013-11-07 DIAGNOSIS — R3129 Other microscopic hematuria: Secondary | ICD-10-CM | POA: Diagnosis not present

## 2013-11-07 DIAGNOSIS — M546 Pain in thoracic spine: Secondary | ICD-10-CM | POA: Diagnosis not present

## 2013-11-08 DIAGNOSIS — D369 Benign neoplasm, unspecified site: Secondary | ICD-10-CM | POA: Insufficient documentation

## 2013-11-08 DIAGNOSIS — D239 Other benign neoplasm of skin, unspecified: Secondary | ICD-10-CM | POA: Diagnosis not present

## 2013-11-08 DIAGNOSIS — Z85828 Personal history of other malignant neoplasm of skin: Secondary | ICD-10-CM | POA: Diagnosis not present

## 2013-11-08 DIAGNOSIS — L819 Disorder of pigmentation, unspecified: Secondary | ICD-10-CM | POA: Diagnosis not present

## 2013-11-08 DIAGNOSIS — L821 Other seborrheic keratosis: Secondary | ICD-10-CM | POA: Diagnosis not present

## 2013-11-08 DIAGNOSIS — D1801 Hemangioma of skin and subcutaneous tissue: Secondary | ICD-10-CM | POA: Diagnosis not present

## 2013-11-21 DIAGNOSIS — N390 Urinary tract infection, site not specified: Secondary | ICD-10-CM | POA: Diagnosis not present

## 2013-11-21 DIAGNOSIS — N3020 Other chronic cystitis without hematuria: Secondary | ICD-10-CM | POA: Diagnosis not present

## 2013-11-22 ENCOUNTER — Emergency Department: Payer: Self-pay | Admitting: Emergency Medicine

## 2013-11-22 DIAGNOSIS — Z9889 Other specified postprocedural states: Secondary | ICD-10-CM | POA: Diagnosis not present

## 2013-11-22 DIAGNOSIS — R42 Dizziness and giddiness: Secondary | ICD-10-CM | POA: Diagnosis not present

## 2013-11-22 DIAGNOSIS — Z90710 Acquired absence of both cervix and uterus: Secondary | ICD-10-CM | POA: Diagnosis not present

## 2013-11-22 DIAGNOSIS — J309 Allergic rhinitis, unspecified: Secondary | ICD-10-CM | POA: Diagnosis not present

## 2013-11-22 DIAGNOSIS — I10 Essential (primary) hypertension: Secondary | ICD-10-CM | POA: Diagnosis not present

## 2013-11-22 DIAGNOSIS — R112 Nausea with vomiting, unspecified: Secondary | ICD-10-CM | POA: Diagnosis not present

## 2013-11-22 DIAGNOSIS — K209 Esophagitis, unspecified without bleeding: Secondary | ICD-10-CM | POA: Diagnosis not present

## 2013-11-22 DIAGNOSIS — R141 Gas pain: Secondary | ICD-10-CM | POA: Diagnosis not present

## 2013-11-22 DIAGNOSIS — Z79899 Other long term (current) drug therapy: Secondary | ICD-10-CM | POA: Diagnosis not present

## 2013-11-22 DIAGNOSIS — K2100 Gastro-esophageal reflux disease with esophagitis, without bleeding: Secondary | ICD-10-CM | POA: Diagnosis not present

## 2013-11-22 DIAGNOSIS — Z9089 Acquired absence of other organs: Secondary | ICD-10-CM | POA: Diagnosis not present

## 2013-11-22 LAB — COMPREHENSIVE METABOLIC PANEL
AST: 17 U/L (ref 15–37)
Albumin: 3.9 g/dL (ref 3.4–5.0)
Alkaline Phosphatase: 96 U/L
Anion Gap: 7 (ref 7–16)
BUN: 16 mg/dL (ref 7–18)
Bilirubin,Total: 0.4 mg/dL (ref 0.2–1.0)
Calcium, Total: 9.7 mg/dL (ref 8.5–10.1)
Chloride: 104 mmol/L (ref 98–107)
Co2: 32 mmol/L (ref 21–32)
Creatinine: 0.86 mg/dL (ref 0.60–1.30)
EGFR (African American): >60
EGFR (Non-African Amer.): >60
GLUCOSE: 145 mg/dL — AB (ref 65–99)
OSMOLALITY: 289 (ref 275–301)
Potassium: 3.7 mmol/L (ref 3.5–5.1)
SGPT (ALT): 29 U/L (ref 12–78)
Sodium: 143 mmol/L (ref 136–145)
TOTAL PROTEIN: 7.6 g/dL (ref 6.4–8.2)

## 2013-11-22 LAB — CBC
HCT: 38 % (ref 35.0–47.0)
HGB: 12.3 g/dL (ref 12.0–16.0)
MCH: 27.8 pg (ref 26.0–34.0)
MCHC: 32.3 g/dL (ref 32.0–36.0)
MCV: 86 fL (ref 80–100)
PLATELETS: 190 10*3/uL (ref 150–440)
RBC: 4.4 10*6/uL (ref 3.80–5.20)
RDW: 14.2 % (ref 11.5–14.5)
WBC: 9.7 10*3/uL (ref 3.6–11.0)

## 2013-11-22 LAB — TROPONIN I: Troponin-I: <0.02 ng/mL

## 2013-11-24 ENCOUNTER — Encounter: Payer: Self-pay | Admitting: *Deleted

## 2013-11-27 ENCOUNTER — Ambulatory Visit (INDEPENDENT_AMBULATORY_CARE_PROVIDER_SITE_OTHER): Payer: Medicare Other | Admitting: Internal Medicine

## 2013-11-27 ENCOUNTER — Encounter: Payer: Self-pay | Admitting: Internal Medicine

## 2013-11-27 VITALS — BP 140/76 | HR 76 | Temp 98.3°F | Resp 16 | Ht 64.5 in | Wt 144.0 lb

## 2013-11-27 DIAGNOSIS — I10 Essential (primary) hypertension: Secondary | ICD-10-CM | POA: Diagnosis not present

## 2013-11-27 DIAGNOSIS — N390 Urinary tract infection, site not specified: Secondary | ICD-10-CM | POA: Diagnosis not present

## 2013-11-27 DIAGNOSIS — K219 Gastro-esophageal reflux disease without esophagitis: Secondary | ICD-10-CM

## 2013-11-27 MED ORDER — ATENOLOL 25 MG PO TABS: 25.0000 mg | ORAL_TABLET | Freq: Every day | ORAL | Status: DC

## 2013-11-27 MED ORDER — FAMOTIDINE 40 MG PO TABS: 40.0000 mg | ORAL_TABLET | Freq: Two times a day (BID) | ORAL | Status: DC

## 2013-11-27 NOTE — Progress Notes (Signed)
Subjective:    Patient ID: Bianca Fry, female    DOB: 1946/10/18, 68 y.o.   MRN: [id number]   Need to change blood pressure medications due to formulary Hypertension Pertinent negatives include no headaches, neck pain or shortness of breath.  Hyperlipidemia Pertinent negatives include no myalgias or shortness of breath.   Chronic e coli  UTIs.  this is the forth infection that she's had.  She as been referred to urology and is being monitored CT was negative  Cytology was negative  She may require prophylactic antibiotic  Wax in ear    Review of Systems  Constitutional: Negative for activity change, appetite change and fatigue.  HENT: Negative for congestion, ear pain, postnasal drip and sinus pressure.   Eyes: Negative for redness and visual disturbance.  Respiratory: Negative for cough, shortness of breath and wheezing.   Gastrointestinal: Negative for abdominal pain and abdominal distention.  Genitourinary: Positive for dysuria, urgency and flank pain. Negative for frequency and menstrual problem.  Musculoskeletal: Negative for arthralgias, joint swelling, myalgias and neck pain.  Skin: Negative for rash and wound.  Neurological: Negative for dizziness, weakness and headaches.  Hematological: Negative for adenopathy. Does not bruise/bleed easily.  Psychiatric/Behavioral: Negative for sleep disturbance and decreased concentration.   Past Medical History  Diagnosis Date  . GERD (gastroesophageal reflux disease)   . Allergy   . Hyperlipidemia   . Arthritis     neck and shoulders, right fingers  . DeQuervain's disease (tenosynovitis) 10/2011    right wrist  . Hypertension     under control; has been on med. since 2009  . Erosive gastritis   . Hiatal hernia   . Internal hemorrhoids   . Colitis   . Anxiety   . Cataract   . Diverticulosis   . Ischemic colitis   . Anemia   . Dyspareunia   . Chronic UTI (urinary tract infection) 2015    referred to urology     History   Social History  . Marital Status: Married    Spouse Name: N/A    Number of Children: 1  . Years of Education: N/A   Occupational History  . Retired    Social History Main Topics  . Smoking status: Never Smoker   . Smokeless tobacco: Never Used  . Alcohol Use: No  . Drug Use: No  . Sexual Activity: Yes    Partners: Male    Birth Control/ Protection: Surgical     Comment: TVH--still has ovaries   Other Topics Concern  . Not on file   Social History Narrative   Daily caffeine     Past Surgical History  Procedure Laterality Date  . Laparoscopic lysis intestinal adhesions  1999  . Cholecystectomy  1970  . Appendectomy  1970    at same time as gallbladder  . Abdominal hysterectomy  1985    partial  . Tonsillectomy  1984  . Cataract extraction  2009    right with lens implant  . Lumbar laminectomy/decompression microdiscectomy  11/29/2007; 12/29/2007; 03/15/2008    left L4-5; fusion 5/09 surgery  . Anterior and posterior vaginal repair  12/31/2009    with TVT sling and cysto  . Dorsal compartment release  11/17/2011    Procedure: RELEASE DORSAL COMPARTMENT (DEQUERVAIN);  Surgeon: Cammie Sickle., MD;  Location: [facility];  Service: Orthopedics;  Laterality: Right;  First dorsal compartment release  . Colonoscopy  02/16/12    Dr. Lucio Edward  .  Eyelid surgery  05/12/2012    right  . Wrist surgery  2013    Sheath was stretched    Family History  Problem Relation Age of Onset  . Adopted: Yes  . Colon cancer Neg Hx   . Diabetes Father   . Stroke Father 60  . Hypertension Father   . Heart disease Mother   . Hypertension Mother   . Hyperlipidemia Mother   . Hypertension Sister   . Hypertension Brother   . Seizures Brother   . Hypertension Sister   . Hypertension Brother   . Hypertension Brother   . Hypertension Brother     Allergies  Allergen Reactions  . Codeine Other (See Comments)    Chest pain  . Nitrofurantoin Other  (See Comments)    Chest pain  . Percocet [Oxycodone-Acetaminophen] Nausea And Vomiting  . Propoxyphene And Methadone Nausea And Vomiting  . Vicodin [Hydrocodone-Acetaminophen] Nausea And Vomiting  . Flagyl [Metronidazole] Diarrhea and Nausea Only  . Clarithromycin Other (See Comments)    Abd. cramps  . Penicillins Rash  . Prednisone Other (See Comments)    UTI  . Sulfonamide Derivatives Other (See Comments)    GI upset    Current Outpatient Prescriptions on File Prior to Visit  Medication Sig Dispense Refill  . acetaminophen (TYLENOL) 650 MG CR tablet Take 650 mg by mouth every 8 (eight) hours as needed. For pain      . ALPRAZolam (XANAX) 0.25 MG tablet Take 1 tablet (0.25 mg total) by mouth 3 (three) times daily as needed. For anxiety  60 tablet  5  . aspirin 81 MG tablet Take 81 mg by mouth daily.      Marland Kitchen atorvastatin (LIPITOR) 20 MG tablet Take 20 mg by mouth 2 (two) times a week. On Monday and Friday      . bisoprolol (ZEBETA) 5 MG tablet Take 0.5 tablets (2.5 mg total) by mouth daily.  30 tablet  6  . CRANBERRY PO Take 1 tablet by mouth daily.       Marland Kitchen dicyclomine (BENTYL) 10 MG capsule Take 10 mg by mouth daily as needed for spasms.      Marland Kitchen docusate sodium (COLACE) 100 MG capsule Take 100 mg by mouth daily as needed for mild constipation.      . fexofenadine (ALLEGRA) 180 MG tablet Take 180 mg by mouth daily as needed for allergies.       Marland Kitchen lidocaine (LIDODERM) 5 % Place 1 patch onto the skin daily. Remove & Discard patch within 12 hours or as directed by MD  30 patch  0  . omeprazole (PRILOSEC) 40 MG capsule Take 40 mg by mouth daily.      Marland Kitchen omeprazole (PRILOSEC) 40 MG capsule TAKE ONE CAPSULE BY MOUTH ONCE DAILY  90 capsule  3  . Probiotic Product (PROBIOTIC DAILY PO) Take 1 tablet by mouth daily.      . traMADol (ULTRAM) 50 MG tablet Take 50 mg by mouth every 8 (eight) hours as needed for moderate pain.       No current facility-administered medications on file prior to visit.     BP 140/76  Pulse 76  Temp(Src) 98.3 F (36.8 C)  Resp 16  Ht 5' 4.5" (1.638 m)  Wt 144 lb (65.318 kg)  BMI 24.34 kg/m2       Objective:   Physical Exam  Nursing note and vitals reviewed. Constitutional: She is oriented to person, place, and time. She appears well-developed and  well-nourished.  HENT:  Head: Normocephalic and atraumatic.  Cardiovascular: Normal rate and regular rhythm.   Pulmonary/Chest: Effort normal and breath sounds normal.  Abdominal: Soft. Bowel sounds are normal.  Musculoskeletal: Normal range of motion.  Neurological: She is alert and oriented to person, place, and time.          Assessment & Plan:  Currently on doxycycline CT at [facility] urology negative Consider low dose macrobid Discussed urinary tract hygiene  Stable blood pressure  The fact that she's had recurrent gram-negative UTIs we will stop the Prilosec and replace that with pepcid  Wax in ear.

## 2013-11-27 NOTE — Patient Instructions (Signed)
The patient is instructed to continue all medications as prescribed. Schedule followup with check out clerk upon leaving the clinic  

## 2013-11-27 NOTE — Progress Notes (Signed)
Pre visit review using our clinic review tool, if applicable. No additional management support is needed unless otherwise documented below in the visit note. 

## 2013-11-28 ENCOUNTER — Telehealth: Payer: Self-pay | Admitting: Internal Medicine

## 2013-11-28 NOTE — Telephone Encounter (Signed)
Relevant patient education assigned to patient using Emmi. ° °

## 2013-12-10 ENCOUNTER — Other Ambulatory Visit: Payer: Self-pay | Admitting: Internal Medicine

## 2013-12-11 ENCOUNTER — Encounter: Payer: Self-pay | Admitting: Internal Medicine

## 2013-12-11 ENCOUNTER — Other Ambulatory Visit: Payer: Self-pay | Admitting: *Deleted

## 2013-12-11 DIAGNOSIS — H9209 Otalgia, unspecified ear: Principal | ICD-10-CM

## 2013-12-11 DIAGNOSIS — G8929 Other chronic pain: Secondary | ICD-10-CM

## 2013-12-18 DIAGNOSIS — N390 Urinary tract infection, site not specified: Secondary | ICD-10-CM | POA: Diagnosis not present

## 2013-12-21 DIAGNOSIS — M542 Cervicalgia: Secondary | ICD-10-CM | POA: Diagnosis not present

## 2013-12-21 DIAGNOSIS — M2669 Other specified disorders of temporomandibular joint: Secondary | ICD-10-CM | POA: Diagnosis not present

## 2013-12-21 DIAGNOSIS — H9209 Otalgia, unspecified ear: Secondary | ICD-10-CM | POA: Diagnosis not present

## 2014-01-30 ENCOUNTER — Telehealth: Payer: Self-pay | Admitting: Internal Medicine

## 2014-01-30 DIAGNOSIS — F419 Anxiety disorder, unspecified: Secondary | ICD-10-CM

## 2014-01-30 NOTE — Telephone Encounter (Signed)
[id number] - [place], Bianca Fry - [address] IS REQUESTING A RE-FILL ON ALPRAZolam (XANAX) 0.25 MG tablet

## 2014-01-31 MED ORDER — ALPRAZOLAM 0.25 MG PO TABS: 0.2500 mg | ORAL_TABLET | Freq: Three times a day (TID) | ORAL | Status: DC | PRN

## 2014-01-31 NOTE — Telephone Encounter (Signed)
Alprazolam 0.25 mg refilled and faxed to pharmacy

## 2014-02-06 NOTE — Telephone Encounter (Signed)
Pt stated pharm never received alprazolam refill. Please call pharm

## 2014-02-07 ENCOUNTER — Telehealth: Payer: Self-pay | Admitting: Internal Medicine

## 2014-02-07 NOTE — Telephone Encounter (Signed)
i called the pharmacy and gave them verbal order for alprazolam

## 2014-02-07 NOTE — Telephone Encounter (Signed)
Pt notified and i stated that i had already faxed pt's alprazolam to [facility]

## 2014-02-07 NOTE — Telephone Encounter (Signed)
Pt called back again today about rx being sent to [facility]

## 2014-02-17 ENCOUNTER — Emergency Department (HOSPITAL_COMMUNITY)
Admission: EM | Admit: 2014-02-17 | Discharge: 2014-02-17 | Disposition: A | Discharge status: Home / Self Care | Payer: No Typology Code available for payment source | Attending: Emergency Medicine | Admitting: Emergency Medicine

## 2014-02-17 ENCOUNTER — Encounter (HOSPITAL_COMMUNITY): Payer: Self-pay | Admitting: Emergency Medicine

## 2014-02-17 ENCOUNTER — Emergency Department (HOSPITAL_COMMUNITY): Payer: No Typology Code available for payment source

## 2014-02-17 DIAGNOSIS — M542 Cervicalgia: Secondary | ICD-10-CM | POA: Diagnosis not present

## 2014-02-17 DIAGNOSIS — Z79899 Other long term (current) drug therapy: Secondary | ICD-10-CM | POA: Insufficient documentation

## 2014-02-17 DIAGNOSIS — E785 Hyperlipidemia, unspecified: Secondary | ICD-10-CM | POA: Diagnosis not present

## 2014-02-17 DIAGNOSIS — F411 Generalized anxiety disorder: Secondary | ICD-10-CM | POA: Diagnosis not present

## 2014-02-17 DIAGNOSIS — S79919A Unspecified injury of unspecified hip, initial encounter: Secondary | ICD-10-CM | POA: Diagnosis present

## 2014-02-17 DIAGNOSIS — I10 Essential (primary) hypertension: Secondary | ICD-10-CM | POA: Insufficient documentation

## 2014-02-17 DIAGNOSIS — M129 Arthropathy, unspecified: Secondary | ICD-10-CM | POA: Insufficient documentation

## 2014-02-17 DIAGNOSIS — K219 Gastro-esophageal reflux disease without esophagitis: Secondary | ICD-10-CM | POA: Diagnosis not present

## 2014-02-17 DIAGNOSIS — Y92410 Unspecified street and highway as the place of occurrence of the external cause: Secondary | ICD-10-CM | POA: Insufficient documentation

## 2014-02-17 DIAGNOSIS — Z87440 Personal history of urinary (tract) infections: Secondary | ICD-10-CM | POA: Insufficient documentation

## 2014-02-17 DIAGNOSIS — M546 Pain in thoracic spine: Secondary | ICD-10-CM | POA: Diagnosis not present

## 2014-02-17 DIAGNOSIS — Z9889 Other specified postprocedural states: Secondary | ICD-10-CM | POA: Insufficient documentation

## 2014-02-17 DIAGNOSIS — Z981 Arthrodesis status: Secondary | ICD-10-CM | POA: Diagnosis not present

## 2014-02-17 DIAGNOSIS — Y9389 Activity, other specified: Secondary | ICD-10-CM | POA: Insufficient documentation

## 2014-02-17 DIAGNOSIS — Z880 Allergy status to penicillin: Secondary | ICD-10-CM | POA: Diagnosis not present

## 2014-02-17 DIAGNOSIS — Z8742 Personal history of other diseases of the female genital tract: Secondary | ICD-10-CM | POA: Diagnosis not present

## 2014-02-17 DIAGNOSIS — S161XXA Strain of muscle, fascia and tendon at neck level, initial encounter: Secondary | ICD-10-CM

## 2014-02-17 DIAGNOSIS — Z8739 Personal history of other diseases of the musculoskeletal system and connective tissue: Secondary | ICD-10-CM | POA: Insufficient documentation

## 2014-02-17 DIAGNOSIS — Z862 Personal history of diseases of the blood and blood-forming organs and certain disorders involving the immune mechanism: Secondary | ICD-10-CM | POA: Diagnosis not present

## 2014-02-17 DIAGNOSIS — IMO0002 Reserved for concepts with insufficient information to code with codable children: Secondary | ICD-10-CM | POA: Diagnosis not present

## 2014-02-17 DIAGNOSIS — Z8669 Personal history of other diseases of the nervous system and sense organs: Secondary | ICD-10-CM | POA: Insufficient documentation

## 2014-02-17 DIAGNOSIS — M25559 Pain in unspecified hip: Secondary | ICD-10-CM | POA: Diagnosis not present

## 2014-02-17 DIAGNOSIS — Z7982 Long term (current) use of aspirin: Secondary | ICD-10-CM | POA: Diagnosis not present

## 2014-02-17 DIAGNOSIS — S139XXA Sprain of joints and ligaments of unspecified parts of neck, initial encounter: Secondary | ICD-10-CM | POA: Diagnosis not present

## 2014-02-17 DIAGNOSIS — T148XXA Other injury of unspecified body region, initial encounter: Secondary | ICD-10-CM | POA: Diagnosis not present

## 2014-02-17 DIAGNOSIS — M545 Low back pain, unspecified: Secondary | ICD-10-CM | POA: Diagnosis not present

## 2014-02-17 DIAGNOSIS — M549 Dorsalgia, unspecified: Secondary | ICD-10-CM

## 2014-02-17 DIAGNOSIS — S7000XA Contusion of unspecified hip, initial encounter: Secondary | ICD-10-CM | POA: Insufficient documentation

## 2014-02-17 DIAGNOSIS — S79929A Unspecified injury of unspecified thigh, initial encounter: Secondary | ICD-10-CM | POA: Diagnosis not present

## 2014-02-17 DIAGNOSIS — S7002XA Contusion of left hip, initial encounter: Secondary | ICD-10-CM

## 2014-02-17 MED ORDER — TRAMADOL HCL 50 MG PO TABS
50.0000 mg | ORAL_TABLET | Freq: Once | ORAL | Status: AC
  Administered 2014-02-17: 50 mg via ORAL
  Filled 2014-02-17: qty 1

## 2014-02-17 MED ORDER — IBUPROFEN 800 MG PO TABS: 800.0000 mg | ORAL_TABLET | Freq: Three times a day (TID) | ORAL | Status: DC

## 2014-02-17 MED ORDER — TRAMADOL HCL 50 MG PO TABS: 50.0000 mg | ORAL_TABLET | Freq: Four times a day (QID) | ORAL | Status: DC | PRN

## 2014-02-17 NOTE — Discharge Instructions (Signed)
Call for a follow up appointment with a Family or Primary Care Provider.  Call Dr. Rolena Infante for further evaluation of your neck, back, and hip pain. Return if Symptoms worsen.   Take medication as prescribed.
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
## 2014-02-17 NOTE — ED Notes (Signed)
Pt has returned from being out of the department; pt placed back on continuous pulse oximetry and blood pressure cuff; family at bedside 

## 2014-02-17 NOTE — ED Notes (Signed)
Pt has been transported out of the department for testing; urine specimen is at bedside

## 2014-02-17 NOTE — ED Notes (Signed)
Pt d/c'd from continuous pulse oximetry and blood pressure cuff; pt getting dressed to be discharge home; family at bedside

## 2014-02-17 NOTE — ED Notes (Signed)
Pt discharged home with all belongings, pt alert, oriented, and ambulatory upon discharge, pt requested to walk to the exit as oppose to wheel chair assistance, this RN escorted pt with stand by assistance, 2 new RX prescribed pt verbalizes understanding of discharge instructions.

## 2014-02-17 NOTE — ED Notes (Signed)
Pt arrived via [facility] on LSB with c-collar intact; warm blankets given

## 2014-02-17 NOTE — ED Notes (Signed)
Pt unhooked to go to the bathroom; pt ambulated well to the bathroom without assistance; will attempt to given an urine sample
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
## 2014-02-17 NOTE — ED Notes (Signed)
Myself and Luellen Pucker, RN assisted Doroteo Bradford, RN with rolling pt off LSB; c-collar still intact; pt undressed, placed in gown and back on continuous pulse oximetry and blood pressure cuff; warm blankets replaced

## 2014-02-17 NOTE — ED Notes (Signed)
Pt has returned from being out of the department; pt placed back on continuous pulse oximetry and blood pressure cuff 

## 2014-02-20 NOTE — ED Provider Notes (Signed)
Medical screening examination/treatment/procedure(s) were performed by non-physician practitioner and as supervising physician I was immediately available for consultation/collaboration.   EKG Interpretation None       Jasper Riling. Alvino Chapel, MD 02/20/14 1208

## 2014-02-28 ENCOUNTER — Encounter: Payer: Self-pay | Admitting: Internal Medicine

## 2014-02-28 ENCOUNTER — Telehealth: Payer: Self-pay | Admitting: Internal Medicine

## 2014-02-28 ENCOUNTER — Ambulatory Visit (INDEPENDENT_AMBULATORY_CARE_PROVIDER_SITE_OTHER): Payer: Medicare Other | Admitting: Internal Medicine

## 2014-02-28 VITALS — BP 146/82 | Temp 97.9°F | Ht 64.5 in | Wt 148.0 lb

## 2014-02-28 DIAGNOSIS — M25559 Pain in unspecified hip: Secondary | ICD-10-CM | POA: Diagnosis not present

## 2014-02-28 DIAGNOSIS — K559 Vascular disorder of intestine, unspecified: Secondary | ICD-10-CM | POA: Insufficient documentation

## 2014-02-28 DIAGNOSIS — M25551 Pain in right hip: Secondary | ICD-10-CM | POA: Insufficient documentation

## 2014-02-28 DIAGNOSIS — R109 Unspecified abdominal pain: Secondary | ICD-10-CM

## 2014-02-28 DIAGNOSIS — G8929 Other chronic pain: Secondary | ICD-10-CM | POA: Insufficient documentation

## 2014-02-28 DIAGNOSIS — R1031 Right lower quadrant pain: Secondary | ICD-10-CM

## 2014-02-28 LAB — POCT URINALYSIS DIPSTICK
BILIRUBIN UA: NEGATIVE
Glucose, UA: NEGATIVE
KETONES UA: NEGATIVE
Nitrite, UA: NEGATIVE
PH UA: 5.5
Protein, UA: NEGATIVE
Spec Grav, UA: 1.015
Urobilinogen, UA: 0.2

## 2014-02-28 MED ORDER — IBUPROFEN 600 MG PO TABS
600.0000 mg | ORAL_TABLET | Freq: Every day | ORAL | Status: DC | PRN
Start: 2014-02-28 — End: 2015-01-16

## 2014-02-28 NOTE — Telephone Encounter (Signed)
Pt wants to have Bianca Fry as PCP.  Please advise if okay and pt is having CPX in June with Bianca Fry since Dr. Arnoldo Morale is unavailable.

## 2014-02-28 NOTE — Progress Notes (Signed)
Pre visit review using our clinic review tool, if applicable. No additional management support is needed unless otherwise documented below in the visit note. 
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
## 2014-02-28 NOTE — Patient Instructions (Signed)
Use ibuprofen 600 mg once daily or Aleve 220 mg once daily for 1-2 weeks  (Take with food and water) Please contact our office if your symptoms do not improve or gets worse. Follow up with your orthopedic specialist as planned
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
## 2014-03-01 NOTE — Telephone Encounter (Signed)
done

## 2014-03-01 NOTE — Telephone Encounter (Signed)
Ok with me 

## 2014-03-02 ENCOUNTER — Telehealth: Payer: Self-pay | Admitting: *Deleted

## 2014-03-02 LAB — URINE CULTURE: Colony Count: 2000

## 2014-03-02 NOTE — Telephone Encounter (Signed)
Pt wanted to come on Monday 03/05/14, appt scheduled

## 2014-03-02 NOTE — Telephone Encounter (Signed)
Message copied by Pearletha Forge on Fri Mar 02, 2014  9:29 AM ------      Message from: Rosine Abe      Created: Wed Feb 28, 2014  8:16 PM       Call pt - I want to see her back in the office in 2 weeks.  (Earlier if her symptoms getting worse) ------

## 2014-03-05 ENCOUNTER — Encounter: Payer: Self-pay | Admitting: Internal Medicine

## 2014-03-05 ENCOUNTER — Ambulatory Visit (INDEPENDENT_AMBULATORY_CARE_PROVIDER_SITE_OTHER): Payer: Medicare Other | Admitting: Internal Medicine

## 2014-03-05 ENCOUNTER — Ambulatory Visit (INDEPENDENT_AMBULATORY_CARE_PROVIDER_SITE_OTHER)
Admission: RE | Admit: 2014-03-05 | Discharge: 2014-03-05 | Disposition: A | Discharge status: Home / Self Care | Payer: Medicare Other | Source: Ambulatory Visit | Attending: Internal Medicine | Admitting: Internal Medicine

## 2014-03-05 VITALS — BP 182/84 | HR 69 | Temp 98.2°F | Wt 148.0 lb

## 2014-03-05 DIAGNOSIS — R1031 Right lower quadrant pain: Secondary | ICD-10-CM | POA: Diagnosis not present

## 2014-03-05 DIAGNOSIS — R1011 Right upper quadrant pain: Secondary | ICD-10-CM | POA: Diagnosis not present

## 2014-03-05 DIAGNOSIS — S3981XA Other specified injuries of abdomen, initial encounter: Secondary | ICD-10-CM | POA: Diagnosis not present

## 2014-03-05 LAB — CBC WITH DIFFERENTIAL/PLATELET
BASOS ABS: 0 10*3/uL (ref 0.0–0.1)
BASOS PCT: 0.3 % (ref 0.0–3.0)
Eosinophils Absolute: 0.2 10*3/uL (ref 0.0–0.7)
Eosinophils Relative: 2.2 % (ref 0.0–5.0)
HCT: 37 % (ref 36.0–46.0)
HEMOGLOBIN: 12.2 g/dL (ref 12.0–15.0)
LYMPHS ABS: 2.5 10*3/uL (ref 0.7–4.0)
Lymphocytes Relative: 31 % (ref 12.0–46.0)
MCHC: 33 g/dL (ref 30.0–36.0)
MCV: 83.7 fl (ref 78.0–100.0)
Monocytes Absolute: 0.7 10*3/uL (ref 0.1–1.0)
Monocytes Relative: 8.3 % (ref 3.0–12.0)
NEUTROS ABS: 4.8 10*3/uL (ref 1.4–7.7)
Neutrophils Relative %: 58.2 % (ref 43.0–77.0)
Platelets: 193 10*3/uL (ref 150.0–400.0)
RBC: 4.42 Mil/uL (ref 3.87–5.11)
RDW: 13.4 % (ref 11.5–15.5)
WBC: 8.2 10*3/uL (ref 4.0–10.5)

## 2014-03-05 LAB — HEPATIC FUNCTION PANEL
ALBUMIN: 3.9 g/dL (ref 3.5–5.2)
ALK PHOS: 87 U/L (ref 39–117)
ALT: 17 U/L (ref 0–35)
AST: 20 U/L (ref 0–37)
Bilirubin, Direct: 0 mg/dL (ref 0.0–0.3)
Total Bilirubin: 0.4 mg/dL (ref 0.2–1.2)
Total Protein: 7.1 g/dL (ref 6.0–8.3)

## 2014-03-05 LAB — BASIC METABOLIC PANEL
BUN: 14 mg/dL (ref 6–23)
CO2: 28 meq/L (ref 19–32)
Calcium: 9.1 mg/dL (ref 8.4–10.5)
Chloride: 106 mEq/L (ref 96–112)
Creatinine, Ser: 0.9 mg/dL (ref 0.4–1.2)
GFR: 70.44 mL/min (ref >60.00)
GLUCOSE: 90 mg/dL (ref 70–99)
Potassium: 4.2 mEq/L (ref 3.5–5.1)
SODIUM: 142 meq/L (ref 135–145)

## 2014-03-05 LAB — SEDIMENTATION RATE: Sed Rate: 31 mm/hr — ABNORMAL HIGH (ref 0–22)

## 2014-03-05 MED ORDER — AMLODIPINE BESYLATE 2.5 MG PO TABS: 2.5000 mg | ORAL_TABLET | Freq: Every day | ORAL | Status: DC

## 2014-03-05 MED ORDER — IOHEXOL 300 MG/ML  SOLN
100.0000 mL | Freq: Once | INTRAMUSCULAR | Status: AC | PRN
  Administered 2014-03-05: 100 mL via INTRAVENOUS
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
## 2014-03-05 NOTE — Progress Notes (Signed)
Pre visit review using our clinic review tool, if applicable. No additional management support is needed unless otherwise documented below in the visit note. 

## 2014-03-05 NOTE — Patient Instructions (Signed)
Our office will contact you re: results of your CT of abdomen and pelvis Please contact our office if your symptoms do not improve or gets worse.

## 2014-03-06 ENCOUNTER — Other Ambulatory Visit: Payer: Self-pay | Admitting: Internal Medicine

## 2014-03-06 MED ORDER — DICYCLOMINE HCL 10 MG PO CAPS: 10.0000 mg | ORAL_CAPSULE | Freq: Two times a day (BID) | ORAL | Status: DC | PRN

## 2014-03-16 DIAGNOSIS — M76899 Other specified enthesopathies of unspecified lower limb, excluding foot: Secondary | ICD-10-CM | POA: Diagnosis not present

## 2014-03-19 ENCOUNTER — Ambulatory Visit: Payer: Medicare Other | Admitting: Family

## 2014-03-21 ENCOUNTER — Encounter: Payer: Self-pay | Admitting: Gastroenterology

## 2014-03-21 ENCOUNTER — Ambulatory Visit (INDEPENDENT_AMBULATORY_CARE_PROVIDER_SITE_OTHER): Payer: Medicare Other | Admitting: Gastroenterology

## 2014-03-21 VITALS — BP 140/60 | HR 64 | Ht 64.0 in | Wt 143.8 lb

## 2014-03-21 DIAGNOSIS — R109 Unspecified abdominal pain: Secondary | ICD-10-CM
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
## 2014-03-21 NOTE — Patient Instructions (Signed)
Please follow up with your Primary Care Physician.   Thank you for choosing me and [facility].  Pricilla Riffle. Dagoberto Ligas., MD., Marval Regal

## 2014-03-22 DIAGNOSIS — N390 Urinary tract infection, site not specified: Secondary | ICD-10-CM | POA: Diagnosis not present

## 2014-03-22 DIAGNOSIS — IMO0002 Reserved for concepts with insufficient information to code with codable children: Secondary | ICD-10-CM | POA: Diagnosis not present

## 2014-03-22 DIAGNOSIS — N3946 Mixed incontinence: Secondary | ICD-10-CM | POA: Diagnosis not present

## 2014-04-10 ENCOUNTER — Encounter: Payer: Self-pay | Admitting: Family

## 2014-04-10 ENCOUNTER — Ambulatory Visit (INDEPENDENT_AMBULATORY_CARE_PROVIDER_SITE_OTHER): Payer: Medicare Other | Admitting: Family

## 2014-04-10 VITALS — BP 120/70 | HR 70 | Ht 64.0 in | Wt 144.0 lb

## 2014-04-10 DIAGNOSIS — I10 Essential (primary) hypertension: Secondary | ICD-10-CM | POA: Diagnosis not present

## 2014-04-10 DIAGNOSIS — K2100 Gastro-esophageal reflux disease with esophagitis, without bleeding: Secondary | ICD-10-CM | POA: Diagnosis not present

## 2014-04-10 DIAGNOSIS — Z0000 Encounter for general adult medical examination without abnormal findings: Secondary | ICD-10-CM | POA: Diagnosis not present

## 2014-04-10 LAB — CBC WITH DIFFERENTIAL/PLATELET
Basophils Absolute: 0 10*3/uL (ref 0.0–0.1)
Basophils Relative: 0.4 % (ref 0.0–3.0)
EOS PCT: 2.9 % (ref 0.0–5.0)
Eosinophils Absolute: 0.2 10*3/uL (ref 0.0–0.7)
HEMATOCRIT: 40.5 % (ref 36.0–46.0)
HEMOGLOBIN: 13.4 g/dL (ref 12.0–15.0)
LYMPHS ABS: 2.2 10*3/uL (ref 0.7–4.0)
Lymphocytes Relative: 29.6 % (ref 12.0–46.0)
MCHC: 33 g/dL (ref 30.0–36.0)
MCV: 84.7 fl (ref 78.0–100.0)
MONO ABS: 0.6 10*3/uL (ref 0.1–1.0)
Monocytes Relative: 7.6 % (ref 3.0–12.0)
NEUTROS ABS: 4.4 10*3/uL (ref 1.4–7.7)
Neutrophils Relative %: 59.5 % (ref 43.0–77.0)
PLATELETS: 176 10*3/uL (ref 150.0–400.0)
RBC: 4.78 Mil/uL (ref 3.87–5.11)
RDW: 14.4 % (ref 11.5–15.5)
WBC: 7.4 10*3/uL (ref 4.0–10.5)

## 2014-04-10 LAB — POCT URINALYSIS DIPSTICK
BILIRUBIN UA: NEGATIVE
Blood, UA: NEGATIVE
Glucose, UA: NEGATIVE
KETONES UA: NEGATIVE
Nitrite, UA: NEGATIVE
PH UA: 7
SPEC GRAV UA: 1.015
Urobilinogen, UA: 0.2

## 2014-04-10 LAB — HEPATIC FUNCTION PANEL
ALT: 42 U/L — AB (ref 0–35)
AST: 25 U/L (ref 0–37)
Albumin: 4.2 g/dL (ref 3.5–5.2)
Alkaline Phosphatase: 94 U/L (ref 39–117)
BILIRUBIN DIRECT: 0.1 mg/dL (ref 0.0–0.3)
Total Bilirubin: 0.6 mg/dL (ref 0.2–1.2)
Total Protein: 7.1 g/dL (ref 6.0–8.3)

## 2014-04-10 LAB — BASIC METABOLIC PANEL
BUN: 15 mg/dL (ref 6–23)
CALCIUM: 9.4 mg/dL (ref 8.4–10.5)
CHLORIDE: 105 meq/L (ref 96–112)
CO2: 28 mEq/L (ref 19–32)
Creatinine, Ser: 0.7 mg/dL (ref 0.4–1.2)
GFR: 83.94 mL/min (ref >60.00)
Glucose, Bld: 108 mg/dL — ABNORMAL HIGH (ref 70–99)
Potassium: 4.2 mEq/L (ref 3.5–5.1)
Sodium: 140 mEq/L (ref 135–145)

## 2014-04-10 LAB — LIPID PANEL
CHOL/HDL RATIO: 3
Cholesterol: 208 mg/dL — ABNORMAL HIGH (ref 0–200)
HDL: 66.2 mg/dL (ref >39.00)
LDL CALC: 106 mg/dL — AB (ref 0–99)
NonHDL: 141.8
TRIGLYCERIDES: 177 mg/dL — AB (ref 0.0–149.0)
VLDL: 35.4 mg/dL (ref 0.0–40.0)

## 2014-04-10 LAB — TSH: TSH: 1.92 u[IU]/mL (ref 0.35–4.50)

## 2014-04-10 NOTE — Progress Notes (Signed)
Pre visit review using our clinic review tool, if applicable. No additional management support is needed unless otherwise documented below in the visit note. 

## 2014-04-10 NOTE — Patient Instructions (Signed)

## 2014-04-10 NOTE — Progress Notes (Signed)
Subjective:    Patient ID: Bianca Fry, female    DOB: 1946-04-21, 68 y.o.   MRN: [id number]  HPI 68 year old white female, nonsmoker, is in today for CPX.  I reviewed all health maintenance protocols including mammography, colonoscopy, bone density Needed referrals were placed. Age and diagnosis  appropriate screening labs were ordered. Her immunization history was reviewed and appropriate vaccinations were ordered. Her current medications and allergies were reviewed and needed refills of her chronic medications were ordered. The plan for yearly health maintenance was discussed all orders and referrals were made as appropriate.   Review of Systems  Constitutional: Negative.   HENT: Negative.   Eyes: Negative.   Respiratory: Negative.   Cardiovascular: Negative.   Gastrointestinal: Negative.   Endocrine: Negative.   Genitourinary: Negative.   Musculoskeletal: Negative.   Skin: Negative.   Allergic/Immunologic: Negative.   Neurological: Negative.   Hematological: Negative.   Psychiatric/Behavioral: Negative.    Past Medical History  Diagnosis Date  . GERD (gastroesophageal reflux disease)   . Allergy   . Hyperlipidemia   . Arthritis     neck and shoulders, right fingers  . DeQuervain's disease (tenosynovitis) 10/2011    right wrist  . Hypertension     under control; has been on med. since 2009  . Erosive gastritis   . Hiatal hernia   . Internal hemorrhoids   . Colitis   . Anxiety   . Cataract   . Diverticulosis   . Ischemic colitis   . Anemia   . Dyspareunia   . Chronic UTI (urinary tract infection) 2015    referred to urology    History   Social History  . Marital Status: Married    Spouse Name: N/A    Number of Children: 1  . Years of Education: N/A   Occupational History  . Retired    Social History Main Topics  . Smoking status: Never Smoker   . Smokeless tobacco: Never Used  . Alcohol Use: No  . Drug Use: No  . Sexual Activity: Yes   Partners: Male    Birth Control/ Protection: Surgical     Comment: TVH--still has ovaries   Other Topics Concern  . Not on file   Social History Narrative   Daily caffeine     Past Surgical History  Procedure Laterality Date  . Laparoscopic lysis intestinal adhesions  1999  . Cholecystectomy  1970  . Appendectomy  1970    at same time as gallbladder  . Abdominal hysterectomy  1985    partial  . Tonsillectomy  1984  . Cataract extraction Right 2009    right with lens implant  . Lumbar laminectomy/decompression microdiscectomy  11/29/2007; 12/29/2007; 03/15/2008    left L4-5; fusion 5/09 surgery  . Anterior and posterior vaginal repair  12/31/2009    with TVT sling and cysto  . Dorsal compartment release  11/17/2011    Procedure: RELEASE DORSAL COMPARTMENT (DEQUERVAIN);  Surgeon: Cammie Sickle., MD;  Location: [facility];  Service: Orthopedics;  Laterality: Right;  First dorsal compartment release  . Colonoscopy  02/16/12    Dr. Lucio Edward  . Eyelid surgery  05/12/2012    right    Family History  Problem Relation Age of Onset  . Adopted: Yes  . Colon cancer Neg Hx   . Diabetes Father   . Stroke Father 45  . Hypertension Father   . Heart disease Mother   . Hypertension Mother   .  Hyperlipidemia Mother   . Hypertension Sister     X 2  . Hypertension Brother     X 3  . Seizures Brother     Allergies  Allergen Reactions  . Codeine Other (See Comments)    Chest pain  . Nitrofurantoin Other (See Comments)    Chest pain  . Percocet [Oxycodone-Acetaminophen] Nausea And Vomiting  . Propoxyphene And Methadone Nausea And Vomiting  . Vicodin [Hydrocodone-Acetaminophen] Nausea And Vomiting  . Flagyl [Metronidazole] Diarrhea and Nausea Only  . Clarithromycin Other (See Comments)    Abd. cramps  . Penicillins Rash  . Prednisone Other (See Comments)    UTI  . Sulfonamide Derivatives Other (See Comments)    GI upset    Current Outpatient  Prescriptions on File Prior to Visit  Medication Sig Dispense Refill  . ALPRAZolam (XANAX) 0.25 MG tablet Take 1 tablet (0.25 mg total) by mouth 3 (three) times daily as needed. For anxiety  60 tablet  5  . amLODipine (NORVASC) 2.5 MG tablet Take 1 tablet (2.5 mg total) by mouth daily.  30 tablet  3  . aspirin 81 MG tablet Take 81 mg by mouth daily.      Marland Kitchen atenolol (TENORMIN) 25 MG tablet Take 1 tablet (25 mg total) by mouth daily.  90 tablet  3  . atorvastatin (LIPITOR) 20 MG tablet Take 20 mg by mouth 2 (two) times a week. On Monday and Friday      . CRANBERRY PO Take 1 tablet by mouth daily.       Marland Kitchen dicyclomine (BENTYL) 10 MG capsule Take 1 capsule (10 mg total) by mouth 2 (two) times daily as needed for spasms.  30 capsule  1  . fexofenadine (ALLEGRA) 180 MG tablet Take 180 mg by mouth daily as needed for allergies.       Marland Kitchen ibuprofen (ADVIL,MOTRIN) 600 MG tablet Take 1 tablet (600 mg total) by mouth daily as needed. Take with food  21 tablet  0  . lidocaine (LIDODERM) 5 % Place 1 patch onto the skin daily. Remove & Discard patch within 12 hours or as directed by MD  30 patch  0  . omeprazole (PRILOSEC) 20 MG capsule Take 20 mg by mouth daily.      . Probiotic Product (PROBIOTIC DAILY PO) Take 1 tablet by mouth daily.      . traMADol (ULTRAM) 50 MG tablet Take 1 tablet (50 mg total) by mouth every 6 (six) hours as needed.  15 tablet  0   No current facility-administered medications on file prior to visit.    BP 120/70  Pulse 70  Ht 5\' 4"  (1.626 m)  Wt 144 lb (65.318 kg)  BMI 24.71 kg/m2  SpO2 97%chart    Objective:   Physical Exam  Constitutional: She is oriented to person, place, and time. She appears well-developed and well-nourished.  HENT:  Head: Normocephalic and atraumatic.  Right Ear: External ear normal.  Left Ear: External ear normal.  Nose: Nose normal.  Mouth/Throat: Oropharynx is clear and moist.  Eyes: Conjunctivae and EOM are normal. Pupils are equal, round, and  reactive to light.  Neck: Normal range of motion. Neck supple.  Cardiovascular: Normal rate, regular rhythm and normal heart sounds.   Pulmonary/Chest: Effort normal and breath sounds normal.  Abdominal: Soft. Bowel sounds are normal.  Musculoskeletal: Normal range of motion.  Neurological: She is alert and oriented to person, place, and time. She has normal reflexes.  Skin: Skin is  warm and dry.  Psychiatric: She has a normal mood and affect.          Assessment & Plan:  Cataleyah was seen today for no specified reason.  Diagnoses and associated orders for this visit:  Preventative health care - Basic Metabolic Panel - Hepatic Function Panel - Lipid Panel - TSH - POCT urinalysis dipstick - CBC with Differential  Unspecified essential hypertension - Basic Metabolic Panel - Hepatic Function Panel - Lipid Panel - TSH - POCT urinalysis dipstick - CBC with Differential  Gastroesophageal reflux disease with esophagitis - Basic Metabolic Panel - Hepatic Function Panel - Lipid Panel - TSH - POCT urinalysis dipstick - CBC with Differential   Call the office with any questions or concerns. Recheck in 6 months and sooner as needed. Patient agrees to schedule CPX

## 2014-04-27 DIAGNOSIS — M76899 Other specified enthesopathies of unspecified lower limb, excluding foot: Secondary | ICD-10-CM | POA: Diagnosis not present

## 2014-04-27 DIAGNOSIS — M47814 Spondylosis without myelopathy or radiculopathy, thoracic region: Secondary | ICD-10-CM | POA: Diagnosis not present

## 2014-05-08 DIAGNOSIS — R159 Full incontinence of feces: Secondary | ICD-10-CM | POA: Diagnosis not present

## 2014-05-08 DIAGNOSIS — M62838 Other muscle spasm: Secondary | ICD-10-CM | POA: Diagnosis not present

## 2014-05-08 DIAGNOSIS — M6281 Muscle weakness (generalized): Secondary | ICD-10-CM | POA: Diagnosis not present

## 2014-05-08 DIAGNOSIS — N3946 Mixed incontinence: Secondary | ICD-10-CM | POA: Diagnosis not present

## 2014-05-09 ENCOUNTER — Telehealth: Payer: Self-pay | Admitting: Gastroenterology

## 2014-05-09 MED ORDER — ONDANSETRON HCL 8 MG PO TABS: 8.0000 mg | ORAL_TABLET | Freq: Three times a day (TID) | ORAL | Status: DC | PRN

## 2014-05-09 NOTE — Telephone Encounter (Signed)
Patient states she has been only taking Zofran 8mg  as needed when her IBS is really bad. Her symptoms of diarrhea can make her nauseous at times. She was prescribed Zofran a few 2 years ago by Dr. Fuller Plan but just now ran out since she has only been taking it as needed. Patient wants to know if she can have a refill?

## 2014-05-09 NOTE — Telephone Encounter (Signed)
Prescription sent to patient's pharmacy and patient notified.  

## 2014-05-09 NOTE — Telephone Encounter (Signed)
OK to refill Zofran

## 2014-05-18 DIAGNOSIS — R3989 Other symptoms and signs involving the genitourinary system: Secondary | ICD-10-CM | POA: Diagnosis not present

## 2014-05-18 DIAGNOSIS — M62838 Other muscle spasm: Secondary | ICD-10-CM | POA: Diagnosis not present

## 2014-05-18 DIAGNOSIS — M6281 Muscle weakness (generalized): Secondary | ICD-10-CM | POA: Diagnosis not present

## 2014-05-18 DIAGNOSIS — R159 Full incontinence of feces: Secondary | ICD-10-CM | POA: Diagnosis not present

## 2014-05-25 ENCOUNTER — Ambulatory Visit: Payer: Self-pay | Admitting: Orthopedic Surgery

## 2014-05-25 DIAGNOSIS — M5137 Other intervertebral disc degeneration, lumbosacral region: Secondary | ICD-10-CM | POA: Diagnosis not present

## 2014-05-25 DIAGNOSIS — M5126 Other intervertebral disc displacement, lumbar region: Secondary | ICD-10-CM | POA: Diagnosis not present

## 2014-05-25 DIAGNOSIS — K589 Irritable bowel syndrome without diarrhea: Secondary | ICD-10-CM | POA: Diagnosis not present

## 2014-05-25 DIAGNOSIS — E7800 Pure hypercholesterolemia, unspecified: Secondary | ICD-10-CM | POA: Diagnosis not present

## 2014-05-25 DIAGNOSIS — M545 Low back pain, unspecified: Secondary | ICD-10-CM | POA: Diagnosis not present

## 2014-05-25 DIAGNOSIS — Z885 Allergy status to narcotic agent status: Secondary | ICD-10-CM | POA: Diagnosis not present

## 2014-05-25 DIAGNOSIS — Z79899 Other long term (current) drug therapy: Secondary | ICD-10-CM | POA: Diagnosis not present

## 2014-05-25 DIAGNOSIS — Z881 Allergy status to other antibiotic agents status: Secondary | ICD-10-CM | POA: Diagnosis not present

## 2014-05-25 DIAGNOSIS — Z981 Arthrodesis status: Secondary | ICD-10-CM | POA: Diagnosis not present

## 2014-05-25 DIAGNOSIS — Z888 Allergy status to other drugs, medicaments and biological substances status: Secondary | ICD-10-CM | POA: Diagnosis not present

## 2014-05-25 DIAGNOSIS — Z882 Allergy status to sulfonamides status: Secondary | ICD-10-CM | POA: Diagnosis not present

## 2014-05-25 DIAGNOSIS — F411 Generalized anxiety disorder: Secondary | ICD-10-CM | POA: Diagnosis not present

## 2014-05-25 DIAGNOSIS — Z880 Allergy status to penicillin: Secondary | ICD-10-CM | POA: Diagnosis not present

## 2014-05-25 DIAGNOSIS — Z7982 Long term (current) use of aspirin: Secondary | ICD-10-CM | POA: Diagnosis not present

## 2014-05-25 LAB — CBC WITH DIFFERENTIAL/PLATELET
BASOS ABS: 0.1 10*3/uL (ref 0.0–0.1)
BASOS PCT: 0.8 %
EOS ABS: 0.2 10*3/uL (ref 0.0–0.7)
Eosinophil %: 2.2 %
HCT: 37 % (ref 35.0–47.0)
HGB: 11.9 g/dL — AB (ref 12.0–16.0)
LYMPHS PCT: 31 %
Lymphocyte #: 2.1 10*3/uL (ref 1.0–3.6)
MCH: 27.5 pg (ref 26.0–34.0)
MCHC: 32.1 g/dL (ref 32.0–36.0)
MCV: 86 fL (ref 80–100)
MONO ABS: 0.7 x10 3/mm (ref 0.2–0.9)
Monocyte %: 10.3 %
Neutrophil #: 3.8 10*3/uL (ref 1.4–6.5)
Neutrophil %: 55.7 %
PLATELETS: 176 10*3/uL (ref 150–440)
RBC: 4.32 10*6/uL (ref 3.80–5.20)
RDW: 14.4 % (ref 11.5–14.5)
WBC: 6.9 10*3/uL (ref 3.6–11.0)

## 2014-05-25 LAB — PROTIME-INR
INR: 0.9
Prothrombin Time: 11.9 secs (ref 11.5–14.7)

## 2014-06-04 DIAGNOSIS — R3989 Other symptoms and signs involving the genitourinary system: Secondary | ICD-10-CM | POA: Diagnosis not present

## 2014-06-04 DIAGNOSIS — N3946 Mixed incontinence: Secondary | ICD-10-CM | POA: Diagnosis not present

## 2014-06-04 DIAGNOSIS — M62838 Other muscle spasm: Secondary | ICD-10-CM | POA: Diagnosis not present

## 2014-06-04 DIAGNOSIS — M6281 Muscle weakness (generalized): Secondary | ICD-10-CM | POA: Diagnosis not present

## 2014-06-12 DIAGNOSIS — R3989 Other symptoms and signs involving the genitourinary system: Secondary | ICD-10-CM | POA: Diagnosis not present

## 2014-06-12 DIAGNOSIS — N3946 Mixed incontinence: Secondary | ICD-10-CM | POA: Diagnosis not present

## 2014-06-12 DIAGNOSIS — M6281 Muscle weakness (generalized): Secondary | ICD-10-CM | POA: Diagnosis not present

## 2014-06-12 DIAGNOSIS — M48061 Spinal stenosis, lumbar region without neurogenic claudication: Secondary | ICD-10-CM | POA: Diagnosis not present

## 2014-06-12 DIAGNOSIS — M62838 Other muscle spasm: Secondary | ICD-10-CM | POA: Diagnosis not present

## 2014-06-14 DIAGNOSIS — M25559 Pain in unspecified hip: Secondary | ICD-10-CM | POA: Diagnosis not present

## 2014-06-14 DIAGNOSIS — M961 Postlaminectomy syndrome, not elsewhere classified: Secondary | ICD-10-CM | POA: Diagnosis not present

## 2014-06-28 DIAGNOSIS — IMO0002 Reserved for concepts with insufficient information to code with codable children: Secondary | ICD-10-CM | POA: Diagnosis not present

## 2014-06-28 DIAGNOSIS — N390 Urinary tract infection, site not specified: Secondary | ICD-10-CM | POA: Diagnosis not present

## 2014-06-28 DIAGNOSIS — N3946 Mixed incontinence: Secondary | ICD-10-CM | POA: Diagnosis not present

## 2014-07-04 DIAGNOSIS — M25559 Pain in unspecified hip: Secondary | ICD-10-CM | POA: Diagnosis not present

## 2014-07-12 DIAGNOSIS — M25559 Pain in unspecified hip: Secondary | ICD-10-CM | POA: Diagnosis not present

## 2014-07-12 DIAGNOSIS — M76899 Other specified enthesopathies of unspecified lower limb, excluding foot: Secondary | ICD-10-CM | POA: Diagnosis not present

## 2014-07-13 ENCOUNTER — Other Ambulatory Visit: Payer: Self-pay

## 2014-07-13 DIAGNOSIS — Z1231 Encounter for screening mammogram for malignant neoplasm of breast: Secondary | ICD-10-CM

## 2014-07-16 ENCOUNTER — Other Ambulatory Visit: Payer: Self-pay | Admitting: Internal Medicine

## 2014-07-25 ENCOUNTER — Encounter: Payer: Medicare Other | Admitting: Internal Medicine

## 2014-07-25 ENCOUNTER — Encounter: Payer: Self-pay | Admitting: Gastroenterology

## 2014-07-26 ENCOUNTER — Ambulatory Visit: Payer: No Typology Code available for payment source | Admitting: Obstetrics and Gynecology

## 2014-07-30 ENCOUNTER — Ambulatory Visit: Payer: Self-pay | Admitting: Orthopedic Surgery

## 2014-07-30 DIAGNOSIS — M25552 Pain in left hip: Secondary | ICD-10-CM | POA: Diagnosis not present

## 2014-07-31 ENCOUNTER — Ambulatory Visit
Admission: RE | Admit: 2014-07-31 | Discharge: 2014-07-31 | Disposition: A | Discharge status: Home / Self Care | Payer: Medicare Other | Source: Ambulatory Visit

## 2014-07-31 ENCOUNTER — Ambulatory Visit (INDEPENDENT_AMBULATORY_CARE_PROVIDER_SITE_OTHER): Payer: Medicare Other

## 2014-07-31 DIAGNOSIS — Z23 Encounter for immunization: Secondary | ICD-10-CM

## 2014-07-31 DIAGNOSIS — Z1231 Encounter for screening mammogram for malignant neoplasm of breast: Secondary | ICD-10-CM | POA: Diagnosis not present

## 2014-07-31 DIAGNOSIS — R399 Unspecified symptoms and signs involving the genitourinary system: Secondary | ICD-10-CM | POA: Diagnosis not present

## 2014-07-31 DIAGNOSIS — M62838 Other muscle spasm: Secondary | ICD-10-CM | POA: Diagnosis not present

## 2014-07-31 DIAGNOSIS — N3946 Mixed incontinence: Secondary | ICD-10-CM | POA: Diagnosis not present

## 2014-07-31 DIAGNOSIS — M6281 Muscle weakness (generalized): Secondary | ICD-10-CM | POA: Diagnosis not present

## 2014-08-17 DIAGNOSIS — M1612 Unilateral primary osteoarthritis, left hip: Secondary | ICD-10-CM | POA: Diagnosis not present

## 2014-08-17 DIAGNOSIS — M25552 Pain in left hip: Secondary | ICD-10-CM | POA: Diagnosis not present

## 2014-08-20 ENCOUNTER — Encounter: Payer: Self-pay | Admitting: Family

## 2014-08-30 DIAGNOSIS — M4807 Spinal stenosis, lumbosacral region: Secondary | ICD-10-CM | POA: Diagnosis not present

## 2014-08-30 DIAGNOSIS — S73102D Unspecified sprain of left hip, subsequent encounter: Secondary | ICD-10-CM | POA: Diagnosis not present

## 2014-08-30 DIAGNOSIS — M25552 Pain in left hip: Secondary | ICD-10-CM | POA: Diagnosis not present

## 2014-09-17 DIAGNOSIS — M5126 Other intervertebral disc displacement, lumbar region: Secondary | ICD-10-CM | POA: Diagnosis not present

## 2014-09-17 DIAGNOSIS — M5408 Panniculitis affecting regions of neck and back, sacral and sacrococcygeal region: Secondary | ICD-10-CM | POA: Diagnosis not present

## 2014-09-18 DIAGNOSIS — M545 Low back pain: Secondary | ICD-10-CM | POA: Diagnosis not present

## 2014-09-20 DIAGNOSIS — N3000 Acute cystitis without hematuria: Secondary | ICD-10-CM | POA: Diagnosis not present

## 2014-09-24 DIAGNOSIS — M545 Low back pain: Secondary | ICD-10-CM | POA: Diagnosis not present

## 2014-09-26 DIAGNOSIS — M545 Low back pain: Secondary | ICD-10-CM | POA: Diagnosis not present

## 2014-10-02 DIAGNOSIS — M545 Low back pain: Secondary | ICD-10-CM | POA: Diagnosis not present

## 2014-10-04 DIAGNOSIS — M545 Low back pain: Secondary | ICD-10-CM | POA: Diagnosis not present

## 2014-10-08 ENCOUNTER — Ambulatory Visit (INDEPENDENT_AMBULATORY_CARE_PROVIDER_SITE_OTHER): Payer: Medicare Other | Admitting: Family

## 2014-10-08 ENCOUNTER — Encounter: Payer: Self-pay | Admitting: Family

## 2014-10-08 VITALS — BP 130/80 | HR 69 | Wt 151.2 lb

## 2014-10-08 DIAGNOSIS — I10 Essential (primary) hypertension: Secondary | ICD-10-CM | POA: Diagnosis not present

## 2014-10-08 DIAGNOSIS — R609 Edema, unspecified: Secondary | ICD-10-CM | POA: Diagnosis not present

## 2014-10-08 DIAGNOSIS — K2100 Gastro-esophageal reflux disease with esophagitis, without bleeding: Secondary | ICD-10-CM

## 2014-10-08 DIAGNOSIS — E7800 Pure hypercholesterolemia, unspecified: Secondary | ICD-10-CM

## 2014-10-08 DIAGNOSIS — J309 Allergic rhinitis, unspecified: Secondary | ICD-10-CM

## 2014-10-08 DIAGNOSIS — Z23 Encounter for immunization: Secondary | ICD-10-CM

## 2014-10-08 DIAGNOSIS — N390 Urinary tract infection, site not specified: Secondary | ICD-10-CM

## 2014-10-08 DIAGNOSIS — F411 Generalized anxiety disorder: Secondary | ICD-10-CM | POA: Diagnosis not present

## 2014-10-08 LAB — POCT URINALYSIS DIPSTICK
BILIRUBIN UA: NEGATIVE
Glucose, UA: NEGATIVE
Ketones, UA: NEGATIVE
LEUKOCYTES UA: NEGATIVE
Nitrite, UA: NEGATIVE
PH UA: 6
Spec Grav, UA: 1.025
Urobilinogen, UA: 0.2

## 2014-10-08 LAB — CBC WITH DIFFERENTIAL/PLATELET
BASOS ABS: 0 10*3/uL (ref 0.0–0.1)
Basophils Relative: 0.5 % (ref 0.0–3.0)
Eosinophils Absolute: 0.2 10*3/uL (ref 0.0–0.7)
Eosinophils Relative: 2.7 % (ref 0.0–5.0)
HEMATOCRIT: 36.9 % (ref 36.0–46.0)
Hemoglobin: 11.8 g/dL — ABNORMAL LOW (ref 12.0–15.0)
Lymphocytes Relative: 27.8 % (ref 12.0–46.0)
Lymphs Abs: 2.3 10*3/uL (ref 0.7–4.0)
MCHC: 32 g/dL (ref 30.0–36.0)
MCV: 84.1 fl (ref 78.0–100.0)
MONOS PCT: 8.2 % (ref 3.0–12.0)
Monocytes Absolute: 0.7 10*3/uL (ref 0.1–1.0)
NEUTROS ABS: 5.1 10*3/uL (ref 1.4–7.7)
Neutrophils Relative %: 60.8 % (ref 43.0–77.0)
Platelets: 168 10*3/uL (ref 150.0–400.0)
RBC: 4.38 Mil/uL (ref 3.87–5.11)
RDW: 14.6 % (ref 11.5–15.5)
WBC: 8.4 10*3/uL (ref 4.0–10.5)

## 2014-10-08 LAB — LIPID PANEL
Cholesterol: 164 mg/dL (ref 0–200)
HDL: 46.7 mg/dL (ref >39.00)
NONHDL: 117.3
TRIGLYCERIDES: 247 mg/dL — AB (ref 0.0–149.0)
Total CHOL/HDL Ratio: 4
VLDL: 49.4 mg/dL — ABNORMAL HIGH (ref 0.0–40.0)

## 2014-10-08 LAB — HEPATIC FUNCTION PANEL
ALK PHOS: 87 U/L (ref 39–117)
ALT: 11 U/L (ref 0–35)
AST: 17 U/L (ref 0–37)
Albumin: 3.8 g/dL (ref 3.5–5.2)
BILIRUBIN DIRECT: 0 mg/dL (ref 0.0–0.3)
Total Bilirubin: 0.4 mg/dL (ref 0.2–1.2)
Total Protein: 6.8 g/dL (ref 6.0–8.3)

## 2014-10-08 LAB — BASIC METABOLIC PANEL
BUN: 14 mg/dL (ref 6–23)
CALCIUM: 8.9 mg/dL (ref 8.4–10.5)
CHLORIDE: 104 meq/L (ref 96–112)
CO2: 26 meq/L (ref 19–32)
CREATININE: 0.7 mg/dL (ref 0.4–1.2)
GFR: 83.82 mL/min (ref >60.00)
Glucose, Bld: 91 mg/dL (ref 70–99)
Potassium: 4 mEq/L (ref 3.5–5.1)
Sodium: 138 mEq/L (ref 135–145)

## 2014-10-08 LAB — LDL CHOLESTEROL, DIRECT: LDL DIRECT: 85.4 mg/dL

## 2014-10-08 MED ORDER — HYDROCHLOROTHIAZIDE 12.5 MG PO TABS: 12.5000 mg | ORAL_TABLET | Freq: Every day | ORAL | Status: DC

## 2014-10-08 MED ORDER — CLONAZEPAM 0.5 MG PO TABS: 0.2500 mg | ORAL_TABLET | Freq: Two times a day (BID) | ORAL | Status: DC | PRN

## 2014-10-08 NOTE — Patient Instructions (Signed)

## 2014-10-08 NOTE — Addendum Note (Signed)
Addended by: Elmer Picker on: 10/08/2014 10:09 AM   Modules accepted: Orders

## 2014-10-08 NOTE — Progress Notes (Signed)
Subjective:    Patient ID: Bianca Fry, female    DOB: December 31, 1945, 68 y.o.   MRN: [id number]  HPI 68 year old white female, nonsmoker with a history of anxiety, hyperlipidemia, hypertension, chronic UTIs, GERD is in today for recheck. Is currently under the care of urology for chronic urinary tract infections and takes cephalexin 250 mg once daily. Chronically has pelvic pain. She's had a cystoscopy done and been through bladder therapy without much relief. Next appointment is in March. Also has complaints of swelling in her legs and feet off and on over the last 6 months. The symptoms are worse when sitting and better first thing in the mornings. Currently takes Norvasc 2.5 mg once daily and atenolol 25 mg twice a day. Patient has a history of anxiety and insomnia and takes Xanax 0.25 mg. Has been against taken an SSRI in the past and currently. Denies any feelings of helplessness or hopelessness of death or dying.    Review of Systems  Constitutional: Negative.   HENT: Negative.   Respiratory: Negative.   Cardiovascular: Negative.   Gastrointestinal: Negative.   Endocrine: Negative.   Genitourinary: Positive for dysuria, frequency and pelvic pain. Negative for urgency, hematuria, vaginal bleeding, vaginal discharge and vaginal pain.  Musculoskeletal: Negative.   Allergic/Immunologic: Negative.   Neurological: Negative.   Hematological: Negative.   Psychiatric/Behavioral: Negative.    Past Medical History  Diagnosis Date  . GERD (gastroesophageal reflux disease)   . Allergy   . Hyperlipidemia   . Arthritis     neck and shoulders, right fingers  . DeQuervain's disease (tenosynovitis) 10/2011    right wrist  . Hypertension     under control; has been on med. since 2009  . Erosive gastritis   . Hiatal hernia   . Internal hemorrhoids   . Colitis   . Anxiety   . Cataract   . Diverticulosis   . Ischemic colitis   . Anemia   . Dyspareunia   . Chronic UTI (urinary tract  infection) 2015    referred to urology    History   Social History  . Marital Status: Married    Spouse Name: N/A    Number of Children: 1  . Years of Education: N/A   Occupational History  . Retired    Social History Main Topics  . Smoking status: Never Smoker   . Smokeless tobacco: Never Used  . Alcohol Use: No  . Drug Use: No  . Sexual Activity:    Partners: Male    Birth Control/ Protection: Surgical     Comment: TVH--still has ovaries   Other Topics Concern  . Not on file   Social History Narrative   Daily caffeine     Past Surgical History  Procedure Laterality Date  . Laparoscopic lysis intestinal adhesions  1999  . Cholecystectomy  1970  . Appendectomy  1970    at same time as gallbladder  . Abdominal hysterectomy  1985    partial  . Tonsillectomy  1984  . Cataract extraction Right 2009    right with lens implant  . Lumbar laminectomy/decompression microdiscectomy  11/29/2007; 12/29/2007; 03/15/2008    left L4-5; fusion 5/09 surgery  . Anterior and posterior vaginal repair  12/31/2009    with TVT sling and cysto  . Dorsal compartment release  11/17/2011    Procedure: RELEASE DORSAL COMPARTMENT (DEQUERVAIN);  Surgeon: Cammie Sickle., MD;  Location: [facility];  Service: Orthopedics;  Laterality: Right;  First dorsal compartment release  . Colonoscopy  02/16/12    Dr. Lucio Edward  . Eyelid surgery  05/12/2012    right    Family History  Problem Relation Age of Onset  . Adopted: Yes  . Colon cancer Neg Hx   . Diabetes Father   . Stroke Father 32  . Hypertension Father   . Heart disease Mother   . Hypertension Mother   . Hyperlipidemia Mother   . Hypertension Sister     X 2  . Hypertension Brother     X 3  . Seizures Brother     Allergies  Allergen Reactions  . Codeine Other (See Comments)    Chest pain  . Nitrofurantoin Other (See Comments)    Chest pain  . Percocet [Oxycodone-Acetaminophen] Nausea And Vomiting  .  Propoxyphene Nausea And Vomiting  . Vicodin [Hydrocodone-Acetaminophen] Nausea And Vomiting  . Flagyl [Metronidazole] Diarrhea and Nausea Only  . Clarithromycin Other (See Comments)    Abd. cramps  . Penicillins Rash  . Prednisone Other (See Comments)    UTI  . Sulfonamide Derivatives Other (See Comments)    GI upset    Current Outpatient Prescriptions on File Prior to Visit  Medication Sig Dispense Refill  . aspirin 81 MG tablet Take 81 mg by mouth daily.    Marland Kitchen atenolol (TENORMIN) 25 MG tablet Take 1 tablet (25 mg total) by mouth daily. 90 tablet 3  . atorvastatin (LIPITOR) 20 MG tablet Take 20 mg by mouth 2 (two) times a week. On Monday and Friday    . CRANBERRY PO Take 1 tablet by mouth daily.     Marland Kitchen dicyclomine (BENTYL) 10 MG capsule Take 1 capsule (10 mg total) by mouth 2 (two) times daily as needed for spasms. 30 capsule 1  . fexofenadine (ALLEGRA) 180 MG tablet Take 180 mg by mouth daily as needed for allergies.     Marland Kitchen ibuprofen (ADVIL,MOTRIN) 600 MG tablet Take 1 tablet (600 mg total) by mouth daily as needed. Take with food 21 tablet 0  . omeprazole (PRILOSEC) 20 MG capsule Take 20 mg by mouth daily.    . ondansetron (ZOFRAN) 8 MG tablet Take 1 tablet (8 mg total) by mouth every 8 (eight) hours as needed for nausea or vomiting. 90 tablet 1  . Probiotic Product (PROBIOTIC DAILY PO) Take 1 tablet by mouth daily.     No current facility-administered medications on file prior to visit.    BP 130/80 mmHg  Pulse 69  Wt 151 lb 3.2 oz (68.584 kg)chart    Objective:   Physical Exam  Constitutional: She is oriented to person, place, and time. She appears well-developed and well-nourished.  HENT:  Right Ear: External ear normal.  Left Ear: External ear normal.  Nose: Nose normal.  Mouth/Throat: Oropharynx is clear and moist.  Neck: Normal range of motion. Neck supple. No thyromegaly present.  Cardiovascular: Normal rate, regular rhythm and normal heart sounds.   Pulmonary/Chest:  Effort normal and breath sounds normal.  Abdominal: Soft. Bowel sounds are normal. She exhibits no distension.  Musculoskeletal: She exhibits no tenderness.  1+ pitting edema noted bilaterally to lower extremities  Neurological: She is alert and oriented to person, place, and time.  Skin: Skin is warm and dry.  Psychiatric: She has a normal mood and affect.          Assessment & Plan:  Bianca Fry was seen today for follow-up.  Diagnoses and associated orders for this visit:  Essential hypertension - Basic Metabolic Panel; Future - Lipid panel; Future - Hepatic Function Panel; Future - POC Urinalysis Dipstick  Pure hypercholesterolemia - Basic Metabolic Panel; Future - Lipid panel; Future - Hepatic Function Panel; Future  Allergic rhinitis, unspecified allergic rhinitis type  Gastroesophageal reflux disease with esophagitis  Generalized anxiety disorder  Chronic UTI - POC Urinalysis Dipstick  Peripheral edema  Other Orders - hydrochlorothiazide (HYDRODIURIL) 12.5 MG tablet; Take 1 tablet (12.5 mg total) by mouth daily. - clonazePAM (KLONOPIN) 0.5 MG tablet; Take 0.5-1 tablets (0.25-0.5 mg total) by mouth 2 (two) times daily as needed for anxiety.    Advised that she should follow-up with urology with regards to bladder concerns. Discontinue Norvasc and start HCTZ 12.5 mg once daily to hopefully help with swelling. Fluid most likely sodium retention. Labs obtained today. Discontinue Xanax due to greater potential for abuse and dependence and start Klonopin 0.25 mg twice a day as needed. Do not take it not necessary. Warned of potential for abuse. Recheck for complete physical exam in 6 months and sooner as needed. Recheck blood pressure in 3 weeks.

## 2014-10-08 NOTE — Addendum Note (Signed)
Addended by: Elmer Picker on: 10/08/2014 10:12 AM   Modules accepted: Orders

## 2014-10-08 NOTE — Progress Notes (Signed)
Pre visit review using our clinic review tool, if applicable. No additional management support is needed unless otherwise documented below in the visit note. 

## 2014-10-10 DIAGNOSIS — M545 Low back pain: Secondary | ICD-10-CM | POA: Diagnosis not present

## 2014-10-19 DIAGNOSIS — S92909A Unspecified fracture of unspecified foot, initial encounter for closed fracture: Secondary | ICD-10-CM

## 2014-10-19 HISTORY — DX: Unspecified fracture of unspecified foot, initial encounter for closed fracture: S92.909A

## 2014-10-30 ENCOUNTER — Telehealth: Payer: Self-pay | Admitting: Family

## 2014-10-30 NOTE — Telephone Encounter (Signed)
°  Nurse: Donalynn Furlong, RN, Myna Hidalgo Date/Time Bianca Fry Time): 10/30/2014 10:06:36 AM  Confirm and document reason for call. If symptomatic, describe symptoms. ---Caller states has been having headaches and dizziness. Bp med was changed in Dec. Started med on Dec 21st, 2015 (Hydrochlorothyazide 12.5 mg 1x day)  Has the patient traveled out of the country within the last 30 days? ---No  Does the patient require triage? ---Yes  Related visit to physician within the last 2 weeks? ---No  Does the PT have any chronic conditions? (i.e. diabetes, asthma, etc.) ---Yes  List chronic conditions. ---High blood pressure, GERD     Guidelines    Guideline Title Affirmed Question Affirmed Notes  Low Blood Pressure [4] Fall in systolic BP > 20 mm Hg from normal AND [2] NOT dizzy, lightheaded, or weak (all triage questions negative)    Final Disposition User   See PCP When Office is Open (within 3 days) Donalynn Furlong, Therapist, sports, Myna Hidalgo

## 2014-11-01 ENCOUNTER — Ambulatory Visit: Payer: Self-pay | Admitting: Family Medicine

## 2014-11-02 ENCOUNTER — Ambulatory Visit (INDEPENDENT_AMBULATORY_CARE_PROVIDER_SITE_OTHER): Payer: PPO | Admitting: Family

## 2014-11-02 ENCOUNTER — Encounter: Payer: Self-pay | Admitting: Family

## 2014-11-02 VITALS — BP 138/64 | HR 68 | Temp 97.4°F | Ht 64.0 in | Wt 149.4 lb

## 2014-11-02 DIAGNOSIS — I10 Essential (primary) hypertension: Secondary | ICD-10-CM | POA: Diagnosis not present

## 2014-11-02 DIAGNOSIS — R609 Edema, unspecified: Secondary | ICD-10-CM | POA: Diagnosis not present

## 2014-11-02 DIAGNOSIS — E7800 Pure hypercholesterolemia, unspecified: Secondary | ICD-10-CM

## 2014-11-02 DIAGNOSIS — F411 Generalized anxiety disorder: Secondary | ICD-10-CM | POA: Diagnosis not present

## 2014-11-02 NOTE — Progress Notes (Signed)
Subjective:    Patient ID: Bianca Fry, female    DOB: 1946/06/03, 69 y.o.   MRN: [id number]  HPI 69 year old white female, nonsmoker with a history of hypertension, hyperlipidemia, anxiety and GERD is in today for recheck of peripheral edema and hypertension. At her last office visit she complained of peripheral edema. Therefore, Norvasc was discontinued. Since that time, peripheral edema has resolved. She discontinued hydrochlorothiazide due to headache and dizziness that has since resolved. Reports doing well now. Anxiety stable.   Review of Systems  Constitutional: Negative.   HENT: Negative.   Respiratory: Negative.   Cardiovascular: Negative.   Gastrointestinal: Negative.   Endocrine: Negative.   Genitourinary: Negative.   Musculoskeletal: Negative.   Skin: Negative.   Allergic/Immunologic: Negative.   Neurological: Negative.   Hematological: Negative.   Psychiatric/Behavioral: Negative.   All other systems reviewed and are negative.  Past Medical History  Diagnosis Date  . GERD (gastroesophageal reflux disease)   . Allergy   . Hyperlipidemia   . Arthritis     neck and shoulders, right fingers  . DeQuervain's disease (tenosynovitis) 10/2011    right wrist  . Hypertension     under control; has been on med. since 2009  . Erosive gastritis   . Hiatal hernia   . Internal hemorrhoids   . Colitis   . Anxiety   . Cataract   . Diverticulosis   . Ischemic colitis   . Anemia   . Dyspareunia   . Chronic UTI (urinary tract infection) 2015    referred to urology    History   Social History  . Marital Status: Married    Spouse Name: N/A    Number of Children: 1  . Years of Education: N/A   Occupational History  . Retired    Social History Main Topics  . Smoking status: Never Smoker   . Smokeless tobacco: Never Used  . Alcohol Use: No  . Drug Use: No  . Sexual Activity:    Partners: Male    Birth Control/ Protection: Surgical     Comment: TVH--still has  ovaries   Other Topics Concern  . Not on file   Social History Narrative   Daily caffeine     Past Surgical History  Procedure Laterality Date  . Laparoscopic lysis intestinal adhesions  1999  . Cholecystectomy  1970  . Appendectomy  1970    at same time as gallbladder  . Abdominal hysterectomy  1985    partial  . Tonsillectomy  1984  . Cataract extraction Right 2009    right with lens implant  . Lumbar laminectomy/decompression microdiscectomy  11/29/2007; 12/29/2007; 03/15/2008    left L4-5; fusion 5/09 surgery  . Anterior and posterior vaginal repair  12/31/2009    with TVT sling and cysto  . Dorsal compartment release  11/17/2011    Procedure: RELEASE DORSAL COMPARTMENT (DEQUERVAIN);  Surgeon: Cammie Sickle., MD;  [facility];  Service: Orthopedics;  Laterality: Right;  First dorsal compartment release  . Colonoscopy  02/16/12    Dr. Lucio Edward  . Eyelid surgery  05/12/2012    right    Family History  Problem Relation Age of Onset  . Adopted: Yes  . Colon cancer Neg Hx   . Diabetes Father   . Stroke Father 60  . Hypertension Father   . Heart disease Mother   . Hypertension Mother   . Hyperlipidemia Mother   . Hypertension Sister  X 2  . Hypertension Brother     X 3  . Seizures Brother     Allergies  Allergen Reactions  . Codeine Other (See Comments)    Chest pain  . Nitrofurantoin Other (See Comments)    Chest pain  . Percocet [Oxycodone-Acetaminophen] Nausea And Vomiting  . Propoxyphene Nausea And Vomiting  . Vicodin [Hydrocodone-Acetaminophen] Nausea And Vomiting  . Flagyl [Metronidazole] Diarrhea and Nausea Only  . Clarithromycin Other (See Comments)    Abd. cramps  . Penicillins Rash  . Prednisone Other (See Comments)    UTI  . Sulfonamide Derivatives Other (See Comments)    GI upset    Current Outpatient Prescriptions on File Prior to Visit  Medication Sig Dispense Refill  . aspirin 81 MG tablet Take 81 mg by  mouth daily.    Marland Kitchen atenolol (TENORMIN) 25 MG tablet Take 1 tablet (25 mg total) by mouth daily. 90 tablet 3  . atorvastatin (LIPITOR) 20 MG tablet Take 20 mg by mouth 2 (two) times a week. On Monday and Friday    . clonazePAM (KLONOPIN) 0.5 MG tablet Take 0.5-1 tablets (0.25-0.5 mg total) by mouth 2 (two) times daily as needed for anxiety. 30 tablet 1  . CRANBERRY PO Take 1 tablet by mouth daily.     Marland Kitchen dicyclomine (BENTYL) 10 MG capsule Take 1 capsule (10 mg total) by mouth 2 (two) times daily as needed for spasms. 30 capsule 1  . fexofenadine (ALLEGRA) 180 MG tablet Take 180 mg by mouth daily as needed for allergies.     Marland Kitchen ibuprofen (ADVIL,MOTRIN) 600 MG tablet Take 1 tablet (600 mg total) by mouth daily as needed. Take with food 21 tablet 0  . omeprazole (PRILOSEC) 20 MG capsule Take 20 mg by mouth daily.    . ondansetron (ZOFRAN) 8 MG tablet Take 1 tablet (8 mg total) by mouth every 8 (eight) hours as needed for nausea or vomiting. 90 tablet 1  . Probiotic Product (PROBIOTIC DAILY PO) Take 1 tablet by mouth daily.     No current facility-administered medications on file prior to visit.    BP 138/64 mmHg  Pulse 68  Temp(Src) 97.4 F (36.3 C) (Oral)  Ht 5\' 4"  (1.626 m)  Wt 149 lb 6.4 oz (67.767 kg)  BMI 25.63 kg/m2chart    Objective:   Physical Exam  Constitutional: She is oriented to person, place, and time. She appears well-developed and well-nourished.  HENT:  Left Ear: External ear normal.  Nose: Nose normal.  Mouth/Throat: Oropharynx is clear and moist.  Neck: Normal range of motion. Neck supple.  Cardiovascular: Normal rate, regular rhythm and normal heart sounds.   Pulmonary/Chest: Effort normal and breath sounds normal.  Abdominal: Soft. Bowel sounds are normal.  Musculoskeletal: Normal range of motion.  Neurological: She is alert and oriented to person, place, and time.  Skin: Skin is warm and dry.  Psychiatric: She has a normal mood and affect.            Assessment & Plan:  Bianca Fry was seen today for medication problem.  Diagnoses and associated orders for this visit:  Essential hypertension  Pure hypercholesterolemia  Peripheral edema  Generalized anxiety disorder    a relationship Continue current medications. Anxiety stable. Recheck in 4-6 months and sooner as needeEF d. Check blood pressure periodically.

## 2014-11-02 NOTE — Patient Instructions (Signed)

## 2014-11-02 NOTE — Progress Notes (Signed)
Pre visit review using our clinic review tool, if applicable. No additional management support is needed unless otherwise documented below in the visit note. 

## 2014-11-08 ENCOUNTER — Other Ambulatory Visit: Payer: Self-pay

## 2014-11-08 MED ORDER — OMEPRAZOLE 40 MG PO CPDR: 40.0000 mg | DELAYED_RELEASE_CAPSULE | Freq: Every day | ORAL | Status: DC

## 2014-11-19 DIAGNOSIS — Q8789 Other specified congenital malformation syndromes, not elsewhere classified: Secondary | ICD-10-CM | POA: Insufficient documentation

## 2014-11-19 DIAGNOSIS — D229 Melanocytic nevi, unspecified: Secondary | ICD-10-CM | POA: Insufficient documentation

## 2014-11-19 DIAGNOSIS — L820 Inflamed seborrheic keratosis: Secondary | ICD-10-CM | POA: Insufficient documentation

## 2014-12-04 ENCOUNTER — Ambulatory Visit (INDEPENDENT_AMBULATORY_CARE_PROVIDER_SITE_OTHER): Payer: PPO | Admitting: Family Medicine

## 2014-12-04 ENCOUNTER — Other Ambulatory Visit: Payer: Self-pay | Admitting: *Deleted

## 2014-12-04 ENCOUNTER — Encounter: Payer: Self-pay | Admitting: Family Medicine

## 2014-12-04 VITALS — BP 138/64 | HR 68 | Temp 97.9°F | Ht 64.25 in | Wt 149.5 lb

## 2014-12-04 DIAGNOSIS — F4321 Adjustment disorder with depressed mood: Secondary | ICD-10-CM

## 2014-12-04 DIAGNOSIS — I10 Essential (primary) hypertension: Secondary | ICD-10-CM

## 2014-12-04 DIAGNOSIS — E785 Hyperlipidemia, unspecified: Secondary | ICD-10-CM

## 2014-12-04 DIAGNOSIS — K559 Vascular disorder of intestine, unspecified: Secondary | ICD-10-CM

## 2014-12-04 DIAGNOSIS — M545 Low back pain: Secondary | ICD-10-CM

## 2014-12-04 DIAGNOSIS — N390 Urinary tract infection, site not specified: Secondary | ICD-10-CM

## 2014-12-04 MED ORDER — CLONAZEPAM 0.5 MG PO TABS: 0.2500 mg | ORAL_TABLET | Freq: Two times a day (BID) | ORAL | Status: DC | PRN

## 2014-12-04 MED ORDER — ATORVASTATIN CALCIUM 20 MG PO TABS: 20.0000 mg | ORAL_TABLET | ORAL | Status: DC

## 2014-12-04 NOTE — Assessment & Plan Note (Signed)
Continue to follow with ortho, has had spine and hip injections

## 2014-12-04 NOTE — Assessment & Plan Note (Signed)
Chronic, stable. Continue atenolol 25mg  once daily.

## 2014-12-04 NOTE — Progress Notes (Signed)
Pre visit review using our clinic review tool, if applicable. No additional management support is needed unless otherwise documented below in the visit note. 

## 2014-12-04 NOTE — Assessment & Plan Note (Signed)
History of ischemic colitis prior seen by [facility]. If she needs referral back to GI requests other office.

## 2014-12-04 NOTE — Assessment & Plan Note (Addendum)
Chronic, stable. Continue lipitor 20mg  twice weekly Discussed importance of lipid control in setting of h/o ischemic colitis.
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
## 2014-12-04 NOTE — Assessment & Plan Note (Signed)
On keflex 500mg  daily per Dr Louis Meckel urologist. Has f/u next month.

## 2014-12-04 NOTE — Assessment & Plan Note (Signed)
Pt endorses predominant anxiety - xanax worked well but feels klonopin helps sleep better. Will continue this. #40 provided today without refills.

## 2014-12-04 NOTE — Patient Instructions (Addendum)
Return for medicare wellness visit 04/12/2015. I've refilled klonopin. Continue medicines as up to now.

## 2014-12-06 ENCOUNTER — Telehealth: Payer: Self-pay | Admitting: Family Medicine

## 2014-12-06 NOTE — Addendum Note (Signed)
Addended by: Santiago Bumpers on: 12/06/2014 11:08 AM   Modules accepted: Orders

## 2014-12-06 NOTE — Telephone Encounter (Signed)
Pt states she was previously administered Prevar 13 while under [facility]'s care.  However, she is now seeing states Dr.Gutierrez informed her she had not received it.  Pt is calling to see if the Prevnar 13 vaccine was documented in her chart.  Requesting a call back from [place] to discuss.

## 2014-12-06 NOTE — Telephone Encounter (Signed)
Spoke with pt and advised Prevnar 13 was documented

## 2014-12-09 ENCOUNTER — Encounter: Payer: Self-pay | Admitting: Family Medicine

## 2014-12-09 DIAGNOSIS — I10 Essential (primary) hypertension: Secondary | ICD-10-CM

## 2014-12-10 MED ORDER — ATENOLOL 25 MG PO TABS: 25.0000 mg | ORAL_TABLET | Freq: Every day | ORAL | Status: DC

## 2014-12-21 ENCOUNTER — Encounter: Payer: Self-pay | Admitting: Obstetrics and Gynecology

## 2014-12-21 ENCOUNTER — Ambulatory Visit (INDEPENDENT_AMBULATORY_CARE_PROVIDER_SITE_OTHER): Payer: PPO | Admitting: Obstetrics and Gynecology

## 2014-12-21 VITALS — BP 130/60 | HR 66 | Resp 14 | Ht 64.5 in | Wt 147.8 lb

## 2014-12-21 DIAGNOSIS — Z01419 Encounter for gynecological examination (general) (routine) without abnormal findings: Secondary | ICD-10-CM | POA: Diagnosis not present

## 2014-12-21 DIAGNOSIS — Z0000 Encounter for general adult medical examination without abnormal findings: Secondary | ICD-10-CM | POA: Diagnosis not present

## 2014-12-21 DIAGNOSIS — Z13820 Encounter for screening for osteoporosis: Secondary | ICD-10-CM | POA: Diagnosis not present

## 2014-12-21 LAB — POCT URINALYSIS DIPSTICK
BILIRUBIN UA: NEGATIVE
Blood, UA: NEGATIVE
Glucose, UA: NEGATIVE
KETONES UA: NEGATIVE
LEUKOCYTES UA: NEGATIVE
Nitrite, UA: NEGATIVE
PROTEIN UA: NEGATIVE
Urobilinogen, UA: NEGATIVE
pH, UA: 5

## 2014-12-21 MED ORDER — ESTRADIOL 0.1 MG/GM VA CREA: TOPICAL_CREAM | VAGINAL | Status: DC

## 2014-12-21 NOTE — Patient Instructions (Signed)
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
## 2014-12-24 ENCOUNTER — Telehealth: Payer: Self-pay | Admitting: Obstetrics and Gynecology

## 2014-12-24 DIAGNOSIS — E2839 Other primary ovarian failure: Secondary | ICD-10-CM

## 2014-12-24 NOTE — Telephone Encounter (Signed)
New order placed for BMD with diagnosis of estrogen deficiency. Spoke with Alana at the [facility] who states they will call the patient to schedule appointment.  Routing to provider for final review. Patient agreeable to disposition. Will close encounter

## 2014-12-24 NOTE — Telephone Encounter (Signed)
[facility] states referral sent for  bone density w/ diagnosis osteoperosis screening.They need diagnosis changed to osteopenia or estrogen deficiency before scheduling.

## 2014-12-27 ENCOUNTER — Encounter: Payer: Self-pay | Admitting: Family Medicine

## 2014-12-27 ENCOUNTER — Ambulatory Visit (INDEPENDENT_AMBULATORY_CARE_PROVIDER_SITE_OTHER): Payer: PPO | Admitting: Family Medicine

## 2014-12-27 VITALS — BP 128/68 | HR 64 | Temp 97.9°F | Wt 147.8 lb

## 2014-12-27 DIAGNOSIS — R1030 Lower abdominal pain, unspecified: Secondary | ICD-10-CM | POA: Insufficient documentation

## 2014-12-27 DIAGNOSIS — R1032 Left lower quadrant pain: Secondary | ICD-10-CM | POA: Insufficient documentation

## 2014-12-27 DIAGNOSIS — R109 Unspecified abdominal pain: Secondary | ICD-10-CM | POA: Insufficient documentation

## 2014-12-27 LAB — COMPREHENSIVE METABOLIC PANEL
ALT: 12 U/L (ref 0–35)
AST: 13 U/L (ref 0–37)
Albumin: 4 g/dL (ref 3.5–5.2)
Alkaline Phosphatase: 93 U/L (ref 39–117)
BUN: 20 mg/dL (ref 6–23)
CALCIUM: 9.4 mg/dL (ref 8.4–10.5)
CHLORIDE: 104 meq/L (ref 96–112)
CO2: 28 mEq/L (ref 19–32)
Creatinine, Ser: 0.85 mg/dL (ref 0.40–1.20)
GFR: 70.27 mL/min (ref >60.00)
Glucose, Bld: 99 mg/dL (ref 70–99)
Potassium: 3.8 mEq/L (ref 3.5–5.1)
Sodium: 137 mEq/L (ref 135–145)
Total Bilirubin: 0.3 mg/dL (ref 0.2–1.2)
Total Protein: 6.9 g/dL (ref 6.0–8.3)

## 2014-12-27 LAB — CBC WITH DIFFERENTIAL/PLATELET
Basophils Absolute: 0 10*3/uL (ref 0.0–0.1)
Basophils Relative: 0.5 % (ref 0.0–3.0)
EOS PCT: 2.6 % (ref 0.0–5.0)
Eosinophils Absolute: 0.2 10*3/uL (ref 0.0–0.7)
HCT: 37.5 % (ref 36.0–46.0)
HEMOGLOBIN: 12.5 g/dL (ref 12.0–15.0)
LYMPHS ABS: 2.5 10*3/uL (ref 0.7–4.0)
Lymphocytes Relative: 33.2 % (ref 12.0–46.0)
MCHC: 33.3 g/dL (ref 30.0–36.0)
MCV: 82.8 fl (ref 78.0–100.0)
MONOS PCT: 9.7 % (ref 3.0–12.0)
Monocytes Absolute: 0.7 10*3/uL (ref 0.1–1.0)
Neutro Abs: 4.1 10*3/uL (ref 1.4–7.7)
Neutrophils Relative %: 54 % (ref 43.0–77.0)
Platelets: 189 10*3/uL (ref 150.0–400.0)
RBC: 4.53 Mil/uL (ref 3.87–5.11)
RDW: 14.2 % (ref 11.5–15.5)
WBC: 7.6 10*3/uL (ref 4.0–10.5)

## 2014-12-27 LAB — POCT URINALYSIS DIPSTICK
Blood, UA: NEGATIVE
Glucose, UA: NEGATIVE
Ketones, UA: NEGATIVE
LEUKOCYTES UA: NEGATIVE
NITRITE UA: NEGATIVE
PH UA: 6
Spec Grav, UA: >=1.03
Urobilinogen, UA: 0.2

## 2014-12-27 NOTE — Patient Instructions (Addendum)
Blood work today. Urine check today. Recommend clear liquid diet for next 24-48 hours. Let us know if pain not continuing to improve - would order cat scan of abdomen. ?recurrent colitis.

## 2014-12-27 NOTE — Progress Notes (Signed)
Pre visit review using our clinic review tool, if applicable. No additional management support is needed unless otherwise documented below in the visit note. 

## 2014-12-27 NOTE — Progress Notes (Signed)
BP 128/68 mmHg  Pulse 64  Temp(Src) 97.9 F (36.6 C) (Oral)  Wt 147 lb 12 oz (67.019 kg)   CC: L sided pain  Subjective:    Patient ID: Bianca Fry, female    DOB: Aug 13, 1946, 69 y.o.   MRN: [id number]  HPI: Bianca Fry is a 69 y.o. female presenting on 12/27/2014 for Abdominal Pain   4 d h/o L sided pain. Points to LLQ. Described as sharp pain. Pain woke her up Sunday night. Pain worse Mon/Tues, but last 2d has eased up some. Mild nausea yesterday. Denies associated fevers/chills, bowel changes like diarrhea or constipation, blood in stool.  Denies urinary symptoms like urgency or dysuria. No new foods. Tried bentyl but unsure if it was helpful. Appetite decreased - changed to bland diet.   Denies inciting trauma/injury.   Did have pelvic exam by GYN last week, painful exam.  Saw back doctor yesterday, given toradol shot yesterday. Also using tramadol for pain that was started yesterday. Known issues with lower back pain.   H/o ischemic colitis - has had 4 spells 2013. Has seen Dr Fuller Plan. Had bad experience with Dr Fuller Plan. If needs return to GI would like to see different MD. On bentyl for this. Also uses zofran prn and probiotic daily and omeprazole 40mg  daily.   Recurrent UTIs - on keflex 500mg  daily ppx. This is prescribed by urologist Louis Meckel at [facility] urology). Next appt 12/27/2014.   Relevant past medical, surgical, family and social history reviewed and updated as indicated. Interim medical history since our last visit reviewed. Allergies and medications reviewed and updated. Current Outpatient Prescriptions on File Prior to Visit  Medication Sig  . aspirin 81 MG tablet Take 81 mg by mouth daily.  Marland Kitchen atenolol (TENORMIN) 25 MG tablet Take 1 tablet (25 mg total) by mouth daily.  Marland Kitchen atorvastatin (LIPITOR) 20 MG tablet Take 1 tablet (20 mg total) by mouth 2 (two) times a week. On Monday and Friday  . cephALEXin (KEFLEX) 500 MG capsule Take 500 mg by mouth daily. UTI ppx  .  clonazePAM (KLONOPIN) 0.5 MG tablet Take 0.5-1 tablets (0.25-0.5 mg total) by mouth 2 (two) times daily as needed for anxiety.  Marland Kitchen CRANBERRY PO Take 1 tablet by mouth daily.   Marland Kitchen dicyclomine (BENTYL) 10 MG capsule Take 1 capsule (10 mg total) by mouth 2 (two) times daily as needed for spasms.  Marland Kitchen estradiol (ESTRACE) 0.1 MG/GM vaginal cream Use 1/2 g vaginally every night for the first 2 weeks, then use 1/2 g vaginally two times a week.  . fexofenadine (ALLEGRA) 180 MG tablet Take 180 mg by mouth daily as needed for allergies.   Marland Kitchen ibuprofen (ADVIL,MOTRIN) 600 MG tablet Take 1 tablet (600 mg total) by mouth daily as needed. Take with food  . omeprazole (PRILOSEC) 40 MG capsule Take 1 capsule (40 mg total) by mouth daily.  . ondansetron (ZOFRAN) 8 MG tablet Take 1 tablet (8 mg total) by mouth every 8 (eight) hours as needed for nausea or vomiting.  . Probiotic Product (PROBIOTIC DAILY PO) Take 1 tablet by mouth daily.   No current facility-administered medications on file prior to visit.    Review of Systems Per HPI unless specifically indicated above     Objective:    BP 128/68 mmHg  Pulse 64  Temp(Src) 97.9 F (36.6 C) (Oral)  Wt 147 lb 12 oz (67.019 kg)  Wt Readings from Last 3 Encounters:  12/27/14 147 lb 12 oz (67.019  kg)  12/21/14 147 lb 12.8 oz (67.042 kg)  12/04/14 149 lb 8 oz (67.813 kg)    Physical Exam  Constitutional: She appears well-developed and well-nourished. No distress.  HENT:  Mouth/Throat: No oropharyngeal exudate.  Dry MM  Cardiovascular: Normal rate, regular rhythm, normal heart sounds and intact distal pulses.   No murmur heard. Pulmonary/Chest: Effort normal and breath sounds normal. No respiratory distress. She has no wheezes. She has no rales.  Abdominal: Soft. Bowel sounds are normal. She exhibits no distension and no mass. There is tenderness. There is no rebound and no guarding.  Musculoskeletal: She exhibits no edema.  Skin: Skin is warm and dry. No  rash noted.  Psychiatric: She has a normal mood and affect.  Nursing note and vitals reviewed.      Assessment & Plan:   Problem List Items Addressed This Visit    Abdominal pain, left lower quadrant - Primary    Doubt pelvic exam related. labwork and urine checked today - concentrated urine. rec increased small sips of water throughout the day. rec clear liquid  Check CBC, CMP, lactic acid. If worsening pain or fever rec seek urgent care. If worsening tomorrow call us for CT scan. ?diverticulitis vs recurrent ischemic colitis vs other. Doubt ovarian pathology - recent GYN exam normal.      Relevant Orders   POCT Urinalysis Dipstick (Completed)   Comprehensive metabolic panel   CBC with Differential/Platelet   Lactic Acid, Plasma       Follow up plan: Return if symptoms worsen or fail to improve.

## 2014-12-27 NOTE — Assessment & Plan Note (Signed)
Doubt pelvic exam related. labwork and urine checked today - concentrated urine. rec increased small sips of water throughout the day. rec clear liquid  Check CBC, CMP, lactic acid. If worsening pain or fever rec seek urgent care. If worsening tomorrow call us for CT scan. ?diverticulitis vs recurrent ischemic colitis vs other. Doubt ovarian pathology - recent GYN exam normal.

## 2014-12-28 ENCOUNTER — Emergency Department: Payer: Self-pay | Admitting: Emergency Medicine

## 2015-01-03 ENCOUNTER — Telehealth: Payer: Self-pay | Admitting: Family Medicine

## 2015-01-03 ENCOUNTER — Ambulatory Visit (INDEPENDENT_AMBULATORY_CARE_PROVIDER_SITE_OTHER): Payer: PPO | Admitting: Family Medicine

## 2015-01-03 ENCOUNTER — Encounter: Payer: Self-pay | Admitting: Family Medicine

## 2015-01-03 DIAGNOSIS — N3000 Acute cystitis without hematuria: Secondary | ICD-10-CM | POA: Diagnosis not present

## 2015-01-03 LAB — POCT URINALYSIS DIPSTICK
BILIRUBIN UA: NEGATIVE
Glucose, UA: NEGATIVE
Ketones, UA: NEGATIVE
Nitrite, UA: NEGATIVE
PH UA: 5.5
RBC UA: NEGATIVE
Urobilinogen, UA: 0.2

## 2015-01-03 LAB — LACTIC ACID, PLASMA: LACTIC ACID: 1 mmol/L (ref 0.5–2.2)

## 2015-01-03 NOTE — Telephone Encounter (Signed)
Pt called back  [phone]

## 2015-01-03 NOTE — Progress Notes (Signed)
BP 108/56 mmHg  Pulse 71  Temp(Src) 97.4 F (36.3 C) (Oral)  Wt 147 lb 4 oz (66.792 kg)  SpO2 97%   CC: abdominal pain  Subjective:    Patient ID: Bianca Fry, female    DOB: 10/04/1946, 69 y.o.   MRN: [id number]  HPI: Bianca Fry is a 69 y.o. female presenting on 01/03/2015 for Urinary Tract Infection   Seen here 12/27/2014 with LLQ abdominal pain. labwork and UA were unremarkable. Seen later that night at [facility] ER with worsening abd pain and urinary retention - records reviewed. Again blood work unremarkable, abd/pelvic CT unremarkable, UA with 2+ blood and 3+ LE with 1133 WBC/HPF, 43 RBC/HPF. Dx with UTI and treated with cipro and flagyl courses. Has been taking twice daily for last 6 days. Given morphine without improvement. On zofran for nausea.   Called today with persistent malaise, abdominal pain, and nausea. Last night when she awoke had epigastric pain with radiation into ribcage. Persistent lack of appetite.   I ordered lactic acid 12/27/2014 - returned today and normal at 1.0.  H/o ischemic colitis - had 4 spells 2013. Prior saw Dr Fuller Plan. But if needs return to GI would like to see different MD. On bentyl for this. Also uses zofran prn and probiotic daily and omeprazole 40mg  daily.   H/o recurrent UTIs - was on keflex 500mg  daily preventatively. But she stopped this several weeks ago. No EtOH use.   Relevant past medical, surgical, family and social history reviewed and updated as indicated. Interim medical history since our last visit reviewed. Allergies and medications reviewed and updated. Current Outpatient Prescriptions on File Prior to Visit  Medication Sig  . aspirin 81 MG tablet Take 81 mg by mouth daily.  Marland Kitchen atenolol (TENORMIN) 25 MG tablet Take 1 tablet (25 mg total) by mouth daily.  Marland Kitchen atorvastatin (LIPITOR) 20 MG tablet Take 1 tablet (20 mg total) by mouth 2 (two) times a week. On Monday and Friday  . cephALEXin (KEFLEX) 500 MG capsule Take 500 mg by mouth  daily. UTI ppx  . clonazePAM (KLONOPIN) 0.5 MG tablet Take 0.5-1 tablets (0.25-0.5 mg total) by mouth 2 (two) times daily as needed for anxiety.  Marland Kitchen CRANBERRY PO Take 1 tablet by mouth daily.   Marland Kitchen dicyclomine (BENTYL) 10 MG capsule Take 1 capsule (10 mg total) by mouth 2 (two) times daily as needed for spasms.  Marland Kitchen estradiol (ESTRACE) 0.1 MG/GM vaginal cream Use 1/2 g vaginally every night for the first 2 weeks, then use 1/2 g vaginally two times a week.  . fexofenadine (ALLEGRA) 180 MG tablet Take 180 mg by mouth daily as needed for allergies.   Marland Kitchen ibuprofen (ADVIL,MOTRIN) 600 MG tablet Take 1 tablet (600 mg total) by mouth daily as needed. Take with food  . omeprazole (PRILOSEC) 40 MG capsule Take 1 capsule (40 mg total) by mouth daily.  . ondansetron (ZOFRAN) 8 MG tablet Take 1 tablet (8 mg total) by mouth every 8 (eight) hours as needed for nausea or vomiting.  . Probiotic Product (PROBIOTIC DAILY PO) Take 1 tablet by mouth daily.   No current facility-administered medications on file prior to visit.    Review of Systems Per HPI unless specifically indicated above     Objective:    BP 108/56 mmHg  Pulse 71  Temp(Src) 97.4 F (36.3 C) (Oral)  Wt 147 lb 4 oz (66.792 kg)  SpO2 97%  Wt Readings from Last 3 Encounters:  01/03/15 147  lb 4 oz (66.792 kg)  12/27/14 147 lb 12 oz (67.019 kg)  12/21/14 147 lb 12.8 oz (67.042 kg)    Physical Exam  Constitutional: She appears well-developed and well-nourished. No distress.  HENT:  Mouth/Throat: No oropharyngeal exudate.  Somewhat dry mm  Cardiovascular: Normal rate, regular rhythm, normal heart sounds and intact distal pulses.   No murmur heard. Pulmonary/Chest: Effort normal and breath sounds normal. No respiratory distress. She has no wheezes. She has no rales.  Abdominal: Soft. Normal appearance and bowel sounds are normal. She exhibits no distension and no mass. There is no hepatosplenomegaly. There is tenderness in the suprapubic area.  There is no rigidity, no rebound, no guarding, no CVA tenderness and negative Murphy's sign.  Musculoskeletal: She exhibits no edema.  Skin: Skin is warm and dry. No rash noted.  Nursing note and vitals reviewed.  Results for orders placed or performed in visit on 01/03/15  POCT Urinalysis Dipstick  Result Value Ref Range   Color, UA Dk yellow    Clarity, UA Cloudy    Glucose, UA Neg    Bilirubin, UA Neg    Ketones, UA Neg    Spec Grav, UA >=1.030    Blood, UA Neg    pH, UA 5.5    Protein, UA 1+    Urobilinogen, UA 0.2    Nitrite, UA Neg    Leukocytes, UA small (1+)       Assessment & Plan:   Problem List Items Addressed This Visit    Acute cystitis without hematuria    UA at ER consistent with acute cystitis with hematuria, today UA without hematuria but persistent LE.  Anticipate abd discomfort is actually due to flagyl - possible intolerance due to GI side effects. Stop flagyl. Finish cipro antibiotic course. UCx sent today. Update if not improving with treatment. Push fluids and rest. Clarified with pt and husband - CT scan did not show diverticulitis but rather diverticulosis and benign hepatic hemagioma. Copy of CT scan report provided to pt.      Relevant Orders   POCT Urinalysis Dipstick (Completed)   Urine culture       Follow up plan: Return if symptoms worsen or fail to improve.

## 2015-01-03 NOTE — Telephone Encounter (Signed)
Patient was seen last week in the office but the pain got so bad she had to go to the ER.  They diagnosed diverticulitis.  Liquid diet for a couple of days.  Given Flagyl and Cipro.  Flagyl makes nauseated with diarrhea.  Thought she was getting better but then during the night had bad pain in stomach, very nauseated.  No blood and no fever.

## 2015-01-03 NOTE — Progress Notes (Signed)
Pre visit review using our clinic review tool, if applicable. No additional management support is needed unless otherwise documented below in the visit note. 

## 2015-01-03 NOTE — Patient Instructions (Addendum)
You had urine infection. Stop flagyl. Finish cipro. You should start feeling better off flagyl. Push fluids and rest. I have sent urine culture today. Call us with an update.

## 2015-01-03 NOTE — Telephone Encounter (Signed)
Patient says she doesn't know what to do.  Please advise.

## 2015-01-03 NOTE — Telephone Encounter (Signed)
Patient would like for Dr. Synthia Innocent nurse to return her call concerning her diverticulitis.

## 2015-01-03 NOTE — Addendum Note (Signed)
Addended by: Ria Bush on: 01/03/2015 06:45 PM   Modules accepted: Miquel Dunn

## 2015-01-03 NOTE — Telephone Encounter (Signed)
Patient notified and appt scheduled.

## 2015-01-03 NOTE — Assessment & Plan Note (Addendum)
UA at ER consistent with acute cystitis with hematuria, today UA without hematuria but persistent LE.  Anticipate abd discomfort is actually due to flagyl - possible intolerance due to GI side effects. Stop flagyl. Finish cipro antibiotic course. UCx sent today. Update if not improving with treatment. Push fluids and rest. Clarified with pt and husband - CT scan did not show diverticulitis but rather diverticulosis and benign hepatic hemagioma. Copy of CT scan report provided to pt.

## 2015-01-03 NOTE — Telephone Encounter (Signed)
ER records reviewed - diverticulosis but not diverticulitis. UA with concern for infection. plz call - schedule f/u in office today at 5:15pm.

## 2015-01-04 LAB — URINE CULTURE
COLONY COUNT: NO GROWTH
Organism ID, Bacteria: NO GROWTH

## 2015-01-07 ENCOUNTER — Telehealth: Payer: Self-pay | Admitting: Family Medicine

## 2015-01-07 ENCOUNTER — Other Ambulatory Visit: Payer: PPO

## 2015-01-07 NOTE — Telephone Encounter (Signed)
Urine looked concentrated. plz ensure she's been staying well hydrated with plenty of fluid. Have we finished cipro course? Would see if abd discomfort improved off all abx - also rec start [place] or other probiotic. Update in 2-3 days. Would also offer return to urology for h/o chronic UTIs.

## 2015-01-07 NOTE — Telephone Encounter (Signed)
Pt says her side is still hurting pretty bad and she also says her tongue has "blisters" on the back of it, she thinks that's from the antibiotic.  She wants to know what she needs to do, if anything, next.  Pt requests c/b.

## 2015-01-07 NOTE — Telephone Encounter (Signed)
Patient notified. She is going to go ahead and call Dr. Hope Budds office now to see if they can get her in tomorrow. She knows something is going on and needs to know what it is because she is miserable.

## 2015-01-12 ENCOUNTER — Emergency Department (HOSPITAL_COMMUNITY)
Admission: EM | Admit: 2015-01-12 | Discharge: 2015-01-12 | Disposition: A | Discharge status: Home / Self Care | Payer: PPO | Attending: Emergency Medicine | Admitting: Emergency Medicine

## 2015-01-12 ENCOUNTER — Encounter (HOSPITAL_COMMUNITY): Payer: Self-pay | Admitting: Emergency Medicine

## 2015-01-12 DIAGNOSIS — Z87440 Personal history of urinary (tract) infections: Secondary | ICD-10-CM | POA: Diagnosis not present

## 2015-01-12 DIAGNOSIS — Z880 Allergy status to penicillin: Secondary | ICD-10-CM | POA: Insufficient documentation

## 2015-01-12 DIAGNOSIS — R1031 Right lower quadrant pain: Secondary | ICD-10-CM | POA: Insufficient documentation

## 2015-01-12 DIAGNOSIS — Z862 Personal history of diseases of the blood and blood-forming organs and certain disorders involving the immune mechanism: Secondary | ICD-10-CM | POA: Diagnosis not present

## 2015-01-12 DIAGNOSIS — Z79899 Other long term (current) drug therapy: Secondary | ICD-10-CM | POA: Insufficient documentation

## 2015-01-12 DIAGNOSIS — M1990 Unspecified osteoarthritis, unspecified site: Secondary | ICD-10-CM | POA: Insufficient documentation

## 2015-01-12 DIAGNOSIS — F419 Anxiety disorder, unspecified: Secondary | ICD-10-CM | POA: Diagnosis not present

## 2015-01-12 DIAGNOSIS — Z87828 Personal history of other (healed) physical injury and trauma: Secondary | ICD-10-CM | POA: Diagnosis not present

## 2015-01-12 DIAGNOSIS — R110 Nausea: Secondary | ICD-10-CM | POA: Insufficient documentation

## 2015-01-12 DIAGNOSIS — K219 Gastro-esophageal reflux disease without esophagitis: Secondary | ICD-10-CM | POA: Insufficient documentation

## 2015-01-12 DIAGNOSIS — E785 Hyperlipidemia, unspecified: Secondary | ICD-10-CM | POA: Diagnosis not present

## 2015-01-12 DIAGNOSIS — Z792 Long term (current) use of antibiotics: Secondary | ICD-10-CM | POA: Insufficient documentation

## 2015-01-12 DIAGNOSIS — R1032 Left lower quadrant pain: Secondary | ICD-10-CM | POA: Diagnosis not present

## 2015-01-12 DIAGNOSIS — Z8669 Personal history of other diseases of the nervous system and sense organs: Secondary | ICD-10-CM | POA: Insufficient documentation

## 2015-01-12 DIAGNOSIS — Z7982 Long term (current) use of aspirin: Secondary | ICD-10-CM | POA: Insufficient documentation

## 2015-01-12 DIAGNOSIS — Z87448 Personal history of other diseases of urinary system: Secondary | ICD-10-CM | POA: Diagnosis not present

## 2015-01-12 DIAGNOSIS — R1030 Lower abdominal pain, unspecified: Secondary | ICD-10-CM | POA: Diagnosis not present

## 2015-01-12 DIAGNOSIS — R197 Diarrhea, unspecified: Secondary | ICD-10-CM | POA: Diagnosis not present

## 2015-01-12 DIAGNOSIS — I10 Essential (primary) hypertension: Secondary | ICD-10-CM | POA: Diagnosis not present

## 2015-01-12 LAB — CBC WITH DIFFERENTIAL/PLATELET
Basophils Absolute: 0 10*3/uL (ref 0.0–0.1)
Basophils Relative: 0 % (ref 0–1)
Eosinophils Absolute: 0.2 10*3/uL (ref 0.0–0.7)
Eosinophils Relative: 1 % (ref 0–5)
HEMATOCRIT: 40.4 % (ref 36.0–46.0)
HEMOGLOBIN: 13.4 g/dL (ref 12.0–15.0)
LYMPHS ABS: 2.5 10*3/uL (ref 0.7–4.0)
LYMPHS PCT: 16 % (ref 12–46)
MCH: 28 pg (ref 26.0–34.0)
MCHC: 33.2 g/dL (ref 30.0–36.0)
MCV: 84.5 fL (ref 78.0–100.0)
MONO ABS: 1.4 10*3/uL — AB (ref 0.1–1.0)
Monocytes Relative: 9 % (ref 3–12)
NEUTROS ABS: 11.7 10*3/uL — AB (ref 1.7–7.7)
NEUTROS PCT: 74 % (ref 43–77)
Platelets: 234 10*3/uL (ref 150–400)
RBC: 4.78 MIL/uL (ref 3.87–5.11)
RDW: 13.9 % (ref 11.5–15.5)
WBC: 15.8 10*3/uL — AB (ref 4.0–10.5)

## 2015-01-12 LAB — COMPREHENSIVE METABOLIC PANEL
ALT: 13 U/L (ref 0–35)
AST: 14 U/L (ref 0–37)
Albumin: 3.4 g/dL — ABNORMAL LOW (ref 3.5–5.2)
Alkaline Phosphatase: 82 U/L (ref 39–117)
Anion gap: 10 (ref 5–15)
BUN: 9 mg/dL (ref 6–23)
CALCIUM: 9.2 mg/dL (ref 8.4–10.5)
CHLORIDE: 101 mmol/L (ref 96–112)
CO2: 25 mmol/L (ref 19–32)
CREATININE: 0.77 mg/dL (ref 0.50–1.10)
GFR calc Af Amer: >90 mL/min (ref >90)
GFR calc non Af Amer: 83 mL/min — ABNORMAL LOW (ref >90)
GLUCOSE: 125 mg/dL — AB (ref 70–99)
Potassium: 3.3 mmol/L — ABNORMAL LOW (ref 3.5–5.1)
Sodium: 136 mmol/L (ref 135–145)
Total Bilirubin: 0.7 mg/dL (ref 0.3–1.2)
Total Protein: 6.9 g/dL (ref 6.0–8.3)

## 2015-01-12 LAB — URINALYSIS, ROUTINE W REFLEX MICROSCOPIC
GLUCOSE, UA: NEGATIVE mg/dL
KETONES UR: 15 mg/dL — AB
LEUKOCYTES UA: NEGATIVE
Nitrite: NEGATIVE
Protein, ur: 30 mg/dL — AB
Specific Gravity, Urine: 1.021 (ref 1.005–1.030)
Urobilinogen, UA: 0.2 mg/dL (ref 0.0–1.0)
pH: 6 (ref 5.0–8.0)

## 2015-01-12 LAB — URINE MICROSCOPIC-ADD ON

## 2015-01-12 LAB — LIPASE, BLOOD: Lipase: 16 U/L (ref 11–59)

## 2015-01-12 LAB — I-STAT CG4 LACTIC ACID, ED: Lactic Acid, Venous: 1.04 mmol/L (ref 0.5–2.0)
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
## 2015-01-12 NOTE — ED Notes (Signed)
Pt c/o abdominal pain ongoing since the 1st of the month. Pt was told she had diverticulitis. Pt also reports nausea and diarrhea.

## 2015-01-12 NOTE — Discharge Instructions (Signed)

## 2015-01-14 ENCOUNTER — Telehealth: Payer: Self-pay | Admitting: Gastroenterology

## 2015-01-14 NOTE — Telephone Encounter (Signed)
Spoke with patient and moved OV to 01/16/15 at 2:00 PM with Tye Savoy, NP.

## 2015-01-15 LAB — URINE CULTURE

## 2015-01-16 ENCOUNTER — Ambulatory Visit (INDEPENDENT_AMBULATORY_CARE_PROVIDER_SITE_OTHER): Payer: PPO | Admitting: Nurse Practitioner

## 2015-01-16 ENCOUNTER — Encounter: Payer: Self-pay | Admitting: Nurse Practitioner

## 2015-01-16 ENCOUNTER — Other Ambulatory Visit (INDEPENDENT_AMBULATORY_CARE_PROVIDER_SITE_OTHER): Payer: PPO

## 2015-01-16 ENCOUNTER — Telehealth: Payer: Self-pay | Admitting: Family Medicine

## 2015-01-16 VITALS — BP 136/74 | HR 72 | Ht 63.5 in | Wt 142.5 lb

## 2015-01-16 DIAGNOSIS — R1032 Left lower quadrant pain: Secondary | ICD-10-CM | POA: Diagnosis not present

## 2015-01-16 DIAGNOSIS — G8929 Other chronic pain: Secondary | ICD-10-CM | POA: Insufficient documentation

## 2015-01-16 LAB — CBC WITH DIFFERENTIAL/PLATELET
Basophils Absolute: 0 10*3/uL (ref 0.0–0.1)
Basophils Relative: 0.3 % (ref 0.0–3.0)
EOS ABS: 0.3 10*3/uL (ref 0.0–0.7)
EOS PCT: 2.6 % (ref 0.0–5.0)
HEMATOCRIT: 38.5 % (ref 36.0–46.0)
HEMOGLOBIN: 13.1 g/dL (ref 12.0–15.0)
LYMPHS ABS: 2.9 10*3/uL (ref 0.7–4.0)
LYMPHS PCT: 28.5 % (ref 12.0–46.0)
MCHC: 33.9 g/dL (ref 30.0–36.0)
MCV: 81.1 fl (ref 78.0–100.0)
Monocytes Absolute: 0.9 10*3/uL (ref 0.1–1.0)
Monocytes Relative: 9.3 % (ref 3.0–12.0)
Neutro Abs: 6 10*3/uL (ref 1.4–7.7)
Neutrophils Relative %: 59.3 % (ref 43.0–77.0)
PLATELETS: 292 10*3/uL (ref 150.0–400.0)
RBC: 4.75 Mil/uL (ref 3.87–5.11)
RDW: 14.1 % (ref 11.5–15.5)
WBC: 10.1 10*3/uL (ref 4.0–10.5)

## 2015-01-16 LAB — C-REACTIVE PROTEIN: CRP: 1.4 mg/dL (ref 0.5–20.0)

## 2015-01-16 LAB — SEDIMENTATION RATE: Sed Rate: 70 mm/hr — ABNORMAL HIGH (ref 0–22)

## 2015-01-16 NOTE — Telephone Encounter (Signed)
Received recent UCx from ER visit last Saturday - 30k Ecoli with multiple resistances, sensitive to nitrofurantoin (allergy), sulfa (allergy) and CTX.  Pt recently completed cipro course (resistant). Can we call pt - recently saw Dr Louis Meckel, what was his recommendations?  If persistent abd discomfort or any flank pain, fever or UTI sxs would recommend treating with CTX 1gm IM daily x 3 days. Looks like she's currently seeing GI

## 2015-01-16 NOTE — Telephone Encounter (Signed)
Pt returned your call. Please call back, thanks

## 2015-01-16 NOTE — Telephone Encounter (Signed)
Message left for patient to return my call.  

## 2015-01-16 NOTE — Progress Notes (Signed)
ED Antimicrobial Stewardship Positive Culture Follow Up   Bianca Fry is an 69 y.o. female who presented to [facility] on 01/12/2015 with a chief complaint of  Chief Complaint  Patient presents with  . Abdominal Pain  . Nausea    Recent Results (from the past 720 hour(s))  Urine culture     Status: None   Collection Time: 01/03/15  6:15 PM  Result Value Ref Range Status   Colony Count NO GROWTH  Final   Organism ID, Bacteria NO GROWTH  Final  Urine culture     Status: None   Collection Time: 01/12/15  9:38 PM  Result Value Ref Range Status   Specimen Description URINE, RANDOM  Final   Special Requests NONE  Final   Colony Count   Final    30,000 COLONIES/ML Performed at [facility]    Culture   Final    ESCHERICHIA COLI Performed at [facility]    Report Status 01/15/2015 FINAL  Final   Organism ID, Bacteria ESCHERICHIA COLI  Final      Susceptibility   Escherichia coli - MIC*    AMPICILLIN >=32 RESISTANT Resistant     CEFAZOLIN >=64 RESISTANT Resistant     CEFTRIAXONE <=1 SENSITIVE Sensitive     CIPROFLOXACIN >=4 RESISTANT Resistant     GENTAMICIN <=1 SENSITIVE Sensitive     LEVOFLOXACIN >=8 RESISTANT Resistant     NITROFURANTOIN <=16 SENSITIVE Sensitive     TOBRAMYCIN <=1 SENSITIVE Sensitive     TRIMETH/SULFA <=20 SENSITIVE Sensitive     PIP/TAZO 8 SENSITIVE Sensitive     * ESCHERICHIA COLI   [name] has a long history of UTIs and was taking suppressive antibiotics until recently.    Her colony count is low and her UA and Urine Micro are mostly within normal limits.   She has had multiple visits with her PCP (Dr. Danise Mina) related to this issue as well as GYN and urology evaluation.  Discussed with Alvina Chou, PA-C.  As her antibiotic options are limited due to allergies and resistance it may be best to preserve her antibiotic options.  Will route her urine culture result to her PCP, Dr Ria Bush for review and a final decision  on antibiotic therapy.   Norva Riffle 01/16/2015, 11:31 AM Infectious Diseases Pharmacist Phone# [phone]

## 2015-01-16 NOTE — Patient Instructions (Signed)
Please go to the basement level to have your labs drawn.   Call us in 7-10 days with an update, [phone]. Ask for Rollene Fare, one of our nurses, if she is not available you may ask for Miyoko Hashimi.

## 2015-01-16 NOTE — Progress Notes (Signed)
History of Present Illness:   Patient is a 69 year old female known to Dr. Fuller Plan. She had a complete screening colonoscopy April 2013 with findings of mild diverticulosis. Two months later, in May 2013, patient had a flexible sigmoidoscopy for hematochezia. Segmental colitis in the descending colon compatible with ischemia was found.   Patient presents today for evaluation of lower abdominal pain present for almost a month.    Timeline :  12/21/14 -  routine GYN exam. Deep palpation of left lower quadrant. Felt fine afterwards.  12/24/14 - awoke with acute left lower quadrant pain .  12/27/14 saw PCP who planned CT scan if pain did not resolve   12/28/14 pain escalated, patient seen at [facility] ED. CT scan of the abdomen and pelvis negative for acute abnormalities, stable hepatic hemangioma seen. Patient told she had a UTI, given Cipro. Also given Flagyl for unclear reasons  12/30/14 - saw PCP who stopped Flagyl due to patient's intolerance of the medication and absence of indication for the medication. Cipro continued.   Last week - Saw urologist for pain and chronic UTIs. Told urine okay. Urology recommended GI evaluation for the pain  01/12/15 -  recurrent escalation of left lower quadrant pain prompting visit to [facility] ED. Repeat imaging not done. White count 15.8. Patient released home. Urinalysis not really suspicious for UTI  The LLQ pain is unrelated to meals or defecation. It is not related to movement. Feels best when lying completely still on her back. Patient had some non-bloody diarrhea while taking antibiotics but that totally resolved after completion of treatment.     Current Medications, Allergies, Past Medical History, Past Surgical History, Family History and Social History were reviewed in [employer] record.   Physical Exam: General: Pleasant, well developed , white female in no acute distress Head: Normocephalic and atraumatic Eyes:  sclerae  anicteric, conjunctiva pink  Ears: Normal auditory acuity Lungs: Clear throughout to auscultation Heart: Regular rate and rhythm Abdomen: Soft, non distended, mild LLQ tenderness. Negative Carnett's sign.  No masses, no hepatomegaly. Normal bowel sounds Musculoskeletal: Symmetrical with no gross deformities  Extremities: No edema  Neurological: Alert oriented x 4, grossly nonfocal Psychological:  Alert and cooperative. Normal mood and affect  Assessment and Recommendations:   69 year old female with one month history of left lower quadrant pain. Pain started after routine GYN exam with palpation of left lower quadrant though I doubt there is any relationship. At any rate patient has been seen by her PCP, urology, and to emergency department physicians. Abd/pelvis CTscan with contrast nondiagnostic. Treated for UTI, repeat u/a not overly concerning. Interestingly patient did have a WBC on 15.8 in ED 4 days ago. Currently her pain is not as severe as it has been at times.   I had a long discussion with the patient and her husband. Source of pain is unclear. I do want to recheck her white count today. Will also add ESR, CRP. She is taking Ultram for the pain. Will call patient with lab results. She will call us in a week or so with a condition update. Patient will call sooner if she has any new or worsening symptoms.   Addendum:  Urine culture from 01/12/15 ED visit positive for low grade E.coli infection (30k). She was treated with cipro but looks like that bug is resistant. PCP is retreating with a different antibiotic. At this point cannot exclude UTI as source of her lower abdominal pain.    CC:  Ria Bush, MD

## 2015-01-17 MED ORDER — CEFDINIR 300 MG PO CAPS: 300.0000 mg | ORAL_CAPSULE | Freq: Two times a day (BID) | ORAL | Status: DC

## 2015-01-17 NOTE — Telephone Encounter (Signed)
Pt returned your call She stated her gi dr ( [facility]) Called back with results of her urine culture it was growing ecoli

## 2015-01-17 NOTE — Telephone Encounter (Signed)
Given urine growing bacteria I would recommend treatment with stronger antibiotic for 1 week. Stop keflex, start omnicef (sent to pharmacy). Was she taking keflex preventatively or for treatment per urology?

## 2015-01-17 NOTE — Telephone Encounter (Signed)
Recommend treat with cefdinir (omnicef). At her pharmacy.

## 2015-01-17 NOTE — Telephone Encounter (Signed)
Pt returning call. Please call pt when available.

## 2015-01-17 NOTE — Telephone Encounter (Signed)
Called, straight to voicemail. If latest culture was this week and no growth, and abd pain improving, ok to hold off on filling antibiotics (and may cancel cefdinir/ omnicef sent to pharmacy).  However, if persistent discomfort would suggest treating prior culture.

## 2015-01-17 NOTE — Telephone Encounter (Signed)
Patient is currently taking keflex.  Her urologist told her her urine was clear.  GI did not have any recommendations for her.  Patient says her WBC was high.  Patient is feeling a little better. Please advise further.

## 2015-01-17 NOTE — Telephone Encounter (Signed)
Message left for patient to return my call.  

## 2015-01-17 NOTE — Telephone Encounter (Signed)
Initially keflex was for treatment then for prevention. She had another culture done this week and got the results today and there was no growth. Do you still want her to take the omnicef anyway? The urologist couldn't find any source of her pain. So she went to GI as well. They couldn't find anything either and think ?nerve radiating around causing the pain. They are giving it 7-10 days to improve and then they are going to reevaluate and may evaluate the other side of her colon. She is also wondering if it could be back related and if she needs a referral to neurosurgery for opinion. Please advise about the abx.

## 2015-01-17 NOTE — Progress Notes (Signed)
Reviewed and agree with management plan.  Jaquay Morneault T. Muriel Hannold, MD FACG 

## 2015-01-18 NOTE — Telephone Encounter (Signed)
Patient notified

## 2015-01-23 ENCOUNTER — Ambulatory Visit
Admission: RE | Admit: 2015-01-23 | Discharge: 2015-01-23 | Disposition: A | Discharge status: Home / Self Care | Payer: PPO | Source: Ambulatory Visit | Attending: Obstetrics and Gynecology | Admitting: Obstetrics and Gynecology

## 2015-01-23 DIAGNOSIS — E2839 Other primary ovarian failure: Secondary | ICD-10-CM

## 2015-01-24 ENCOUNTER — Ambulatory Visit: Payer: PPO | Admitting: Gastroenterology

## 2015-01-25 ENCOUNTER — Telehealth: Payer: Self-pay | Admitting: Gastroenterology

## 2015-01-25 NOTE — Telephone Encounter (Signed)
Patient with continued abdominal pain, she completed the antibiotics yesterday.  She says that the LLQ pain has not resolved.  She is scheduled for follow up for 03/04/15

## 2015-02-05 NOTE — Op Note (Signed)
PATIENT NAME:  Bianca Fry, Bianca Fry MR#:  [id number] DATE OF BIRTH:  October 14, 1946  DATE OF PROCEDURE:  05/12/2012  PREOPERATIVE DIAGNOSES:  1. Right upper eyelid entropion.  2. Right upper eyelid trichiasis.  POSTOPERATIVE DIAGNOSES:  1. Right upper eyelid entropion.  2. Right upper eyelid trichiasis.  PROCEDURES:  1. Right upper eyelid tarsal wedge resection for repair of entropion.  2. Right upper eyelid cautery depilation.  SURGEON: Janalee Dane, M.D.   DESCRIPTION OF PROCEDURE: The patient was placed in the supine position on the operating room table. After general MAC anesthesia had been induced, the right eyelid was protected with a corneal shield after topical proparacaine. The upper eyelid was locally anesthetized and prepped and draped in the usual fashion. A #15 blade was used to make a supratarsal incision and wedge resection of tarsal tendon and undermining of the very delicate scar tissue to elevate the remaining tarsal plate was carried out. Two mattress-type 5-0 clear nylon sutures were placed. The skin was then closed with a running 7-0 nylon. The lateral 8 mm of eyelashes were completely removed and then cauterized along the margin of the eyelid. The corneal shield was then removed. Iced saline gauze was placed. The patient was returned to anesthesia, allowed to emerge from anesthesia in the Operating Room, and taken to the Recovery Room in stable condition. There were no complications. Estimated blood loss less than 5 mL.  ____________________________ J. Nadeen Landau, MD jmc:slb D: 05/12/2012 08:35:20 ET     T: 05/12/2012 10:03:13 ET       JOB#: [id number] cc: Janalee Dane, MD, <Dictator> Nicholos Johns MD ELECTRONICALLY SIGNED 06/02/2012 7:54

## 2015-02-10 NOTE — Consult Note (Signed)
Pt improved each day, now no pain, no vomiting, no BRBPR, some dark brown.  Will do flex sig tomorrow afternoon with Fleets enema and likely can go home tomorrow on oral meds.  Electronic Signatures: Manya Silvas (MD)  (Signed on 04-Feb-13 17:42)  Authored  Last Updated: 04-Feb-13 17:42 by Manya Silvas (MD)

## 2015-02-10 NOTE — Consult Note (Signed)
Pt with acute abd pain, diarrhea, vomiting, then bleeding BRB.  WBC 18K, abd very tender to slight finger percussion.  Likely sources are infectious colitis, ischemic colitis or diverticilitis.  Agree with antibiotics and iv fluids, small amts of iv morphine prn.   Will follow with you.  Will take 3-4 days to turn around this illness.  Electronic Signatures: Manya Silvas (MD)  (Signed on 01-Feb-13 16:21)  Authored  Last Updated: 01-Feb-13 16:21 by Manya Silvas (MD)

## 2015-02-10 NOTE — Consult Note (Signed)
Pt feeling better, had 2 stools during night, down from 4.  Abd with less discomfort, able to cough without abd pain.  Pt had dark brown stool.  Chest clear, tongue slight coating.  WBC down to 11, K 3.2, creat 0.7.  Pt making good progress.  Flex sig Tuesday.  Electronic Signatures: Manya Silvas (MD)  (Signed on 03-Feb-13 10:57)  Authored  Last Updated: 03-Feb-13 10:57 by Manya Silvas (MD)

## 2015-02-10 NOTE — H&P (Signed)
PATIENT NAME:  Bianca Fry, Bianca Fry MR#:  [id number] DATE OF BIRTH:  March 20, 1946  DATE OF ADMISSION:  11/20/2011  REFERRING PHYSICIAN: Dr. Mariea Clonts    PRIMARY CARE PHYSICIAN: Dr. Benay Pillow with [facility]    PRESENTING COMPLAINT: Nausea, vomiting, diarrhea, abdominal pain, bright red blood per rectum.   HISTORY OF PRESENT ILLNESS: Bianca Fry is a pleasant 69 year old woman with history of hypertension, hyperlipidemia, and gastroesophageal reflux disease who presents today with reports of developing lower abdominal cramping and pain while on the commode and had a syncopal episode x2. Reports that it was only for a few seconds with having nausea, vomiting, and ongoing diarrhea which she reports was bloody. The patient reports still having abdominal pain. Her nausea and vomiting has slightly improved. She denies any chest pain or shortness of breath. No palpitations or arrhythmia. No sick contact or recent travel.   PAST MEDICAL HISTORY:  1. Gastroesophageal reflux disease. 2. Hypertension.  3. Hyperlipidemia.  4. Neck pain.   PAST SURGICAL HISTORY:  1. Right wrist surgery.  2. Cataract surgery on the right.  3. Cholecystectomy.  4. Hysterectomy.  5. Bladder tack.  6. Exploratory laparotomy.  7. Back surgery x3.   ALLERGIES: Penicillin, codeine, Biaxin, sulfa, Vicodin, Percocet, Darvocet, prednisone, oxycodone.   MEDICATIONS:  1. Benicar 20/12.5 mg daily.  2. Lipitor 20 mg on Mondays and Fridays.  3. Prilosec 40 mg daily.  4. Biotin over-the-counter.   5. Daily multivitamin.  6. Colace 1 tablet daily.  7. Skelaxin as needed.   SOCIAL HISTORY: Denies any tobacco, alcohol, or drug use. She lives with her husband outside of [place].   FAMILY HISTORY: Father died of a stroke at 15. Mother died of a heart attack at 79.   REVIEW OF SYSTEMS: CONSTITUTIONAL: No fevers, chills. EYES: No inflammation. She has history of cataracts. ENT: No ear pain, epistaxis, discharge. RESPIRATORY: No cough,  wheezing, hemoptysis, shortness of breath. CARDIOVASCULAR: No chest pain, orthopnea, edema, palpitation. Endorses syncope as per history of present illness. GI: As per history of present illness. No hematemesis. GU: No dysuria or hematuria. ENDOCRINE: No polyuria or polydipsia. HEME: No easy bleeding. SKIN: No ulcers. MUSCULOSKELETAL: She has issues with chronic neck pain. NEUROLOGIC: No one-sided weakness or numbness. PSYCH: Denies any depression or suicidal ideation.   PHYSICAL EXAMINATION:   VITAL SIGNS: Temperature 98.2, pulse 92, respiratory rate 20, blood pressure 123/56, sating at 99% on room air.   GENERAL: Lying in bed in no apparent distress.   HEENT: Normocephalic, atraumatic. Pupils are equal, symmetric, nonicteric. Nares without discharge. Slightly dry mucous membrane.   NECK: Soft and supple. No adenopathy or JVP.   CARDIOVASCULAR: Non-tachy. No murmurs, rubs, or gallops.   LUNGS: Clear to auscultation bilaterally. No use of accessory muscles or increased respiratory effort.   ABDOMEN: Soft. Positive bowel sounds. Tenderness mainly in the left lower quadrant but mild tenderness elsewhere. No rebound or guarding.   EXTREMITIES: No edema. Dorsal pedis pulses intact.   MUSCULOSKELETAL: No joint effusion.   SKIN: No ulcers.   NEUROLOGIC: No dysarthria or aphasia. Symmetrical strength. No focal deficits.   PSYCH: She is alert and oriented. The patient is cooperative.   PERTINENT LABS AND STUDIES: INR 1. PTT 28.3. Urinalysis with specific gravity of 1.011, blood 1+, pH 5, leukocyte esterase 1+, RBC 1 per high-power field, WBC 12 per high-power field. WBC 18.7, hemoglobin 13.4, hematocrit 40.1, platelet 204, MCV 85, glucose 123, BUN 17, creatinine 0.89, sodium 137, potassium 3.2, chloride 101, carbon  dioxide 22, calcium 9. LFTs within normal limits. CT scan of the abdomen and pelvis showed colitis of the descending and sigmoid colon, also noted diverticulosis.   ASSESSMENT AND  PLAN: Bianca Fry is a 69 year old woman with history of hypertension, hyperlipidemia, and gastroesophageal reflux disease presenting with abdominal pain, nausea, vomiting, bloody diarrhea, and syncope x2.  1. Colitis. Send stool studies. Follow leukocytosis. Empiric treatment on Cipro and Flagyl. Obtain GI consultation. Will try on clear diet. Continue IV fluids and pain control as needed.  2. Syncope, likely vasovagal, based on her history. Will cycle her cardiac enzymes. Continue on telemetry for now.  3. Hypertension. Restart Benicar. Hold HCTZ portion while receiving IV fluids.  4. Hypokalemia. As above, hold HCTZ. Send mag level. Give potassium supplement.  5. Hyperlipidemia. Hold Lipitor.  6. Prophylaxis. TEDs, SCDs, and Prilosec.   TIME SPENT: Approximately 45 minutes spent on patient care.  ____________________________ Rita Ohara, MD ap:drc D: 11/20/2011 09:38:18 ET T: 11/20/2011 10:00:44 ET JOB#: [id number] cc: Brien Few Jasmeet Manton, MD, <Dictator>, Dr. Benay Pillow with [facility] MD ELECTRONICALLY SIGNED 12/15/2011 0:33

## 2015-02-10 NOTE — Discharge Summary (Signed)
PATIENT NAME:  Bianca Fry, Bianca Fry MR#:  [id number] DATE OF BIRTH:  April 09, 1946  DATE OF ADMISSION:  11/20/2011 DATE OF DISCHARGE:  11/24/2011  ADMITTING PHYSICIAN: Alounthith Phichith, MD   DISCHARGING PHYSICIAN: Gladstone Lighter, MD    PRIMARY MD: In [place]: GI consultation by Dr. Vira Agar    DISCHARGE DIAGNOSES:  1. Colitis, could be ischemic colitis versus colitis with diverticulitis.  2. Leukocytosis. 3. Hypertension.  4. Hypokalemia.  5. Oral thrush.  6. Hyperlipidemia.  7. Anxiety.   DISCHARGE HOME MEDICATIONS:  1. Tramadol 50 mg p.o. q.6 hours p.r.n.  2. Prilosec 40 mg p.o. daily.  3. Lipitor 20 mg on Monday and Friday.  4. Multivitamin 1 tablet orally once a day.  5. Colace 100 mg p.o. daily.  6. Allegra as needed.  7. Xanax 0.25 mg p.o. 3 times a day as needed.  8. Ciprofloxacin 500 mg p.o. b.i.d. until 12/03/2011.  9. Flagyl 500 mg p.o. q.8 hours until 12/03/2011.  10. Nystatin 100,000 units/mL 5 mL p.o. q.6 hours until 12/03/2011.  11. Benicar/HCTZ 20/12.5 mg 1 tablet p.o. daily.    DISCHARGE DIET: Advised to be on full liquids and soft diet for the next two days before transitioning to a regular low sodium diet.   DISCHARGE ACTIVITY: As tolerated.   FOLLOW-UP INSTRUCTIONS:  1. Follow up with PCP in two weeks.  2. GI follow-up in 1 to 2 weeks. The patient will need an outpatient colonoscopy at that appointment.   LABS AT THE TIME OF DISCHARGE: WBC came down to 11.1 on 11/22/2011, hemoglobin 10.2, hematocrit 30.2, platelet scant 152, sodium 143, potassium 3.9 after replacement, chloride 109, bicarb 23, BUN 5, creatinine 0.7, glucose 106, calcium 8.0. Cardiac enzymes have remained negative. Giardia  lamblia ova and parasite exam is negative. Stool for Clostridium difficile is negative. Stool cultures have remained negative. Urinalysis with 1+ leukocyte esterase, few bacteria. WBC at the time of admission was 18.7. CT of the abdomen and  pelvis showing findings consistent with colitis involving the rectosigmoid and also left colon. No bowel obstruction or free air is present.   BRIEF HOSPITAL COURSE: Ms. Zuver is a 69 year old pleasant Caucasian female with past medical history significant for hypertension, hyperlipidemia, and gastroesophageal reflux disease who presented to the hospital complaining of left lower quadrant abdominal pain, bloody diarrhea, and also syncopal episode associated with that.  1. Acute rectosigmoid colitis, likely was thought to be infectious versus ischemic colitis. The patient was placed on IV fluids, clear liquid diet, IV Cipro and also Flagyl. The patient was examined by GI physician, Dr. Gaylyn Cheers. Her symptoms gradually improved with diarrhea becoming less frequent and fresh bloody stools turning to more brownish color. Her left lower quadrant abdominal pain was relieved within two days of admission. She had a flex sigmoidoscopy done by Dr. Vira Agar on the day of discharge which showed small internal hemorrhoids and negative left side of the colon. The patient was advised to follow-up with Dr. Vira Agar in the office in 1 to 2 weeks after discharge so that he can plan for an outpatient colonoscopy. Her symptoms improved on the IV antibiotics so her antibiotics have been changed to p.o. and she will complete a total of 14 day course of antibiotics for her acute colitis. After doing the flex sigmoidoscopy, Dr. Vira Agar was of the impression that she either had ischemic colitis versus diverticulosis that caused the bleeding on top of the colitis. The patient was on a  soft diet tolerating well prior to discharge.   2. Syncopal episode at the time of admission secondary to dehydration, vasovagal syncope from her diarrhea. She had no further syncopal episodes while in the hospital.  3. Leukocytosis secondary to colitis. That improved and was almost back to normal prior to discharge.  4. Hypertension. She will be going  back on her Benicar/HCTZ medications.  5. Hypokalemia. The patient was persistently hypokalemic with diarrhea while in the hospital. That resolved prior to discharge after replacement.  6. Oral thrush. She had nausea and metallic taste in the mouth and her tongue had white coating. Her symptoms improved when she was started on nystatin every six hours. She will finish the 10-day course of nystatin.   Her course has been otherwise uneventful in the hospital.   DISCHARGE CONDITION: Stable.   DISCHARGE DISPOSITION: Home.   TIME SPENT ON DISCHARGE: 45 minutes.   ____________________________ Gladstone Lighter, MD rk:drc D: 11/25/2011 09:56:01 ET T: 11/25/2011 11:27:37 ET JOB#: [id number]  cc: Gladstone Lighter, MD, <Dictator> Dr. Benay Pillow, [facility] in [place] MD ELECTRONICALLY SIGNED 12/09/2011 11:54

## 2015-02-10 NOTE — Consult Note (Signed)
Pt had flex sig to distal sigmoid-rectosigmoid junction.  No mucosal inflammation seen.  She can go home today and follow up with me in office middle of next week.  Will plan colonoscopy for 4-6 weeks from now.  Electronic Signatures: Manya Silvas (MD)  (Signed on 05-Feb-13 15:07)  Authored  Last Updated: 05-Feb-13 15:07 by Manya Silvas (MD)

## 2015-02-10 NOTE — Consult Note (Signed)
PATIENT NAME:  Bianca Fry, Bianca Fry MR#:  [id number] DATE OF BIRTH:  18-Dec-1945  DATE OF CONSULTATION:  11/20/2011  REFERRING PHYSICIAN:  Alounthith Phichith, MD  CONSULTING PHYSICIAN:  Gaylyn Cheers, MD/Chavonne Sforza A. Jerelene Redden, ANP  PRIMARY CARE PHYSICIAN: Benay Pillow, MD with [facility]   REASON FOR CONSULTATION: Colitis.   HISTORY OF PRESENT ILLNESS: Ms. Vankirk is a 69 year old patient with history of hypertension, gastroesophageal reflux disease, and hypercholesterolemia who reports she was in good health until Wednesday. She actually had tendon surgery on her right wrist Tuesday and was taking some pain medication and did well. She was eating lightly. Wednesday she had chicken noodle soup for breakfast. At about 1 p.m. she developed acute watery diarrhea and after several movements she started to see bright red blood per rectum. She had an episode of severe diarrhea with vomiting while on the toilet, became hot, diaphoretic, felt like she was going to pass out. She went to the bedroom, laid down, vomited a couple of times. Bowel movements were about every 15 minutes accompanied with bright red blood, left lower quadrant discomfort, and pain. She had some chills. No fever. No flulike illness.   The patient denies antibiotic exposure, drinks well water, but no one in the family has been ill. She said stools were just a little loose Monday before the wrist surgery Tuesday. She reports a prior colonoscopy before her gallbladder surgery but cannot recall any dates or details and we do not have a record of this. The patient reports typically her bowels are formed and regular. She denies any fever, body aches, headaches, URI symptoms, and does not feel like she has the flu. The patient has been utilizing ibuprofen fairly regularly for several months prior to her tendon surgery.   PAST MEDICAL HISTORY: 1. Gastroesophageal reflux disease.  2. Hypertension.  3. Hyperlipidemia.  4. History of chronic neck pain.   PAST  SURGICAL HISTORY:  1. Cholecystectomy.  2. Right wrist tendon surgery.  3. Partial hysterectomy.  4. Bladder tack.  5. Exploratory lap. She said her colon was attached to the rib. This was done in [place]. 6. Three back surgeries about 2009.  7. Cataract surgery on the right.   MEDICATIONS:  1. Benicar 20/12.5 mg daily.  2. Lipitor 20 mg on Monday and Friday.  3. Prilosec 40 mg daily.  4. Biotin over-the-counter.  5. Daily multivitamin.  6. Colace 1 tablet daily.  7. Skelaxin as needed.   ALLERGIES: Penicillin, codeine, Biaxin, sulfa, Vicodin, Percocet, Darvocet, prednisone, and oxycodone.   HABITS: Negative tobacco, alcohol, or illicit drug use.   FAMILY HISTORY: Negative for colorectal polyp or neoplasm. Father deceased with stroke at age 29. Mother deceased with heart attack age 54.   REVIEW OF SYSTEMS: 10 systems reviewed, positive as noted in history, otherwise negative x10.   PHYSICAL EXAMINATION:   VITAL SIGNS: Temperature 98.6, pulse 84, respirations 20, blood pressure 126/72, pulse oximetry on room air is 96%.   GENERAL: Well appearing Caucasian female resting in bed in no acute distress.   HEENT: Head is normocephalic. Conjunctivae pink. Sclerae anicteric. Oral mucosa is dry and intact.   NECK: Supple without lymphadenopathy. Trachea is midline.   CARDIAC: S1, S2 without murmur, rub, or gallop.   LUNGS: Clear to auscultation. Respirations are eupneic.   ABDOMEN: Soft, hyperactive bowel sounds, nondistended. Definite tenderness in left lower quadrant, little tenderness on the right as well. There is a little bit of rebound. No guarding.   RECTAL: Digital rectal exam  is deferred. As reported by patient in the ER, there was bright red blood found.    EXTREMITIES: Without edema, cyanosis, or clubbing.   SKIN: Warm and dry without ulcers. She is freckled.   MUSCULOSKELETAL: No joint swelling or erythema. Gait not evaluated. Muscle strength equal bilaterally.  Cranial nerves II through XII grossly intact. Alert and oriented. Good memory.   PSYCH: Affect and mood within normal, very pleasant, looks relaxed.    LABORATORY STUDIES: Labs from 11/19/2011 glucose 123, BUN 17, creatinine 0.89, sodium 139, potassium 3.2. Liver panel is unremarkable including albumin of 3.5. CPK, MB, troponin negative. WBC elevated at 18.7, hemoglobin 13.4, normocytic. Pro-time 13.3. INR 1.0. PTT 28.3. C. difficile negative. Positive WBCs in stool study. Urinalysis unremarkable. Positive for blood, 1+ leukocytes, hazy yellow. CT of the abdomen and pelvis with contrast performed 11/19/2011 showing no bowel distention, no inflammatory changes in the right lower quadrant. There is prominent bowel wall thickening noted in the rectosigmoid and left colon consistent with colitis. Diverticulosis is present. The patient has had prior appendectomy and prior hysterectomy. No pelvic mass. Small amount of free pelvic fluid noted.   IMPRESSION:  1. Acute diarrheal illness with hematochezia, left lower quadrant tenderness, positive CT showing colitis involving the rectosigmoid and left colon. Positive WBCs in stool study, negative C. difficile. Remaining comprehensive culture pending. Etiology to consider bacterial infection, less likely new emerging inflammatory bowel disease, to consider ischemic colitis although the patient is without tobacco history or known cardiovascular disease.  2. History of hyperlipidemia. 3. Hypertension.  4. History of chronic NSAID use, certainly could produce an NSAID induced colitis.   PLAN: The patient seems to be feeling better overnight with IV fluids and IV Flagyl and Cipro. Would continue the antibiotics, close monitoring of WBC, and clinical course. Recommendation for luminal evaluation per Dr. Vira Agar reviewed. The patient is tolerating a clear liquid diet at this time and would not advance until at least tomorrow depending on her course.   Thank you for the  consultation.   These services provided by Joelene Millin A. Jerelene Redden, ANP under collaborative agreement with Gaylyn Cheers, MD.  ____________________________ Janalyn Harder. Jerelene Redden, ANP kam:drc D: 11/20/2011 14:41:42 ET T: 11/20/2011 15:16:26 ET JOB#: [id number]  cc: Joelene Millin A. Jerelene Redden, ANP, <Dictator> Janalyn Harder. Sherlyn Hay, MSN, ANP-BC Adult Nurse Practitioner ELECTRONICALLY SIGNED 11/24/2011 12:04

## 2015-02-10 NOTE — Consult Note (Signed)
Pt on dilaudid instead of morphine.   Electronic Signatures: Manya Silvas (MD)  (Signed on 01-Feb-13 16:23)  Authored  Last Updated: 01-Feb-13 16:23 by Manya Silvas (MD)

## 2015-02-10 NOTE — Consult Note (Signed)
Pt feels a little better today, had 4 episodes of loose stool last night, night before it was every half hour.  Abd pain still present, a little better, has pain with cough.  Blood per rectum more rusty color than BRB.  Afebrile, VSS.  WBC down to 14, hgb down to 10, some fall may be rehydration.  No SOB, no chest pain.  Has stockings on, due to need to get up frequently she is not on compression devices.  Still think infectious versus ischemic.  Plan to do a flex sig before discharge for a bit more definitive diagnosis.  Electronic Signatures: Manya Silvas (MD)  (Signed on 02-Feb-13 13:01)  Authored  Last Updated: 02-Feb-13 13:01 by Manya Silvas (MD)

## 2015-02-23 ENCOUNTER — Encounter: Payer: Self-pay | Admitting: Family Medicine

## 2015-02-28 ENCOUNTER — Ambulatory Visit (INDEPENDENT_AMBULATORY_CARE_PROVIDER_SITE_OTHER): Payer: PPO | Admitting: Gastroenterology

## 2015-02-28 ENCOUNTER — Encounter: Payer: Self-pay | Admitting: Gastroenterology

## 2015-02-28 VITALS — BP 104/70 | HR 76 | Ht 63.5 in | Wt 144.2 lb

## 2015-02-28 DIAGNOSIS — R1032 Left lower quadrant pain: Secondary | ICD-10-CM

## 2015-02-28 DIAGNOSIS — K921 Melena: Secondary | ICD-10-CM

## 2015-02-28 MED ORDER — NA SULFATE-K SULFATE-MG SULF 17.5-3.13-1.6 GM/177ML PO SOLN: 1.0000 | Freq: Once | ORAL | Status: DC

## 2015-02-28 NOTE — Patient Instructions (Addendum)
You have been scheduled for a colonoscopy. Please follow written instructions given to you at your visit today.  Please pick up your prep supplies at the pharmacy within the next 1-3 days. If you use inhalers (even only as needed), please bring them with you on the day of your procedure. Your physician has requested that you go to www.startemmi.com and enter the access code given to you at your visit today. This web site gives a general overview about your procedure. However, you should still follow specific instructions given to you by our office regarding your preparation for the procedure.  You may have a light breakfast the morning of prep day (the day before the procedure). You may choose from: eggs, toast, chicken noodle soup, crackers.  You should have your breakfast completed between 8:00 and 9:00 am.  Clear liquids only for the rest of the day on prep day and up until 3 hours before procedure.   Thank you for choosing me and [facility].  Pricilla Riffle. Dagoberto Ligas., MD., Marval Regal

## 2015-02-28 NOTE — Progress Notes (Signed)
    History of Present Illness: This is a 69 year old female accompanied by her husband. She has a very mild left lower quadrant pain that worsens with bowel movements. Her left back left flank and left lower quadrant abdominal pain were much more severe several weeks ago that have substantially improved. This improvement seems to correlate with injections in her lumbar spine area. She states she underwent a myelogram at [facility] and she is treated by Dr. Mindi Junker. She has noted small amounts of bright red blood per rectum with bowel movements. She has one soft stool per day. Addition she was treated for urinary tract infection and her lower abdominal pain improved.  Current Medications, Allergies, Past Medical History, Past Surgical History, Family History and Social History were reviewed in [employer] record.  Physical Exam: General: Well developed , well nourished, no acute distress Head: Normocephalic and atraumatic Eyes:  sclerae anicteric, EOMI Ears: Normal auditory acuity Mouth: No deformity or lesions Lungs: Clear throughout to auscultation Heart: Regular rate and rhythm; no murmurs, rubs or bruits Abdomen: Soft, minimal lower abdominal tenderness to deep palpation without rebound or guarding and non distended. No masses, hepatosplenomegaly or hernias noted. Normal Bowel sounds Musculoskeletal: Symmetrical with no gross deformities  Pulses:  Normal pulses noted Extremities: No clubbing, cyanosis, edema or deformities noted Neurological: Alert oriented x 4, grossly nonfocal Psychological:  Alert and cooperative. Normal mood and affect  Assessment and Recommendations:  1. Multi-factorial LLQ pain without a GI etiology identified. Radicular pain or referred pain from her lower back appears to be her primary problem at this point. Recently treated urinary tract infection was also contributing. She remains with very minimal left lower quadrant pain associated with bowel  movements and small volume hematochezia. Schedule colonoscopy for further evaluation. The risks (including bleeding, perforation, infection, missed lesions, medication reactions and possible hospitalization or surgery if complications occur), benefits, and alternatives to colonoscopy with possible biopsy and possible polypectomy were discussed with the patient and they consent to proceed.

## 2015-03-04 ENCOUNTER — Ambulatory Visit: Payer: PPO | Admitting: Gastroenterology

## 2015-03-05 ENCOUNTER — Encounter: Payer: Self-pay | Admitting: Gastroenterology

## 2015-03-06 ENCOUNTER — Other Ambulatory Visit: Payer: Self-pay | Admitting: Gastroenterology

## 2015-03-06 ENCOUNTER — Ambulatory Visit (AMBULATORY_SURGERY_CENTER): Payer: PPO | Admitting: Gastroenterology

## 2015-03-06 ENCOUNTER — Encounter: Payer: Self-pay | Admitting: Gastroenterology

## 2015-03-06 VITALS — BP 137/70 | HR 67 | Temp 96.6°F | Resp 67 | Ht 63.0 in | Wt 144.0 lb

## 2015-03-06 DIAGNOSIS — K921 Melena: Secondary | ICD-10-CM

## 2015-03-06 DIAGNOSIS — K649 Unspecified hemorrhoids: Secondary | ICD-10-CM | POA: Diagnosis not present

## 2015-03-06 MED ORDER — SODIUM CHLORIDE 0.9 % IV SOLN: 500.0000 mL | INTRAVENOUS | Status: DC

## 2015-03-06 NOTE — Op Note (Signed)
[facility]  [address] [place], [address]   COLONOSCOPY PROCEDURE REPORT  PATIENT: Bianca, Fry  MR#: [id number] BIRTHDATE: [birth year] , 69  yrs. old GENDER: female ENDOSCOPIST: Ladene Artist, MD, [facility] [R PROCEDURE DATE:  03/06/2015 PROCEDURE:   Colonoscopy with biopsy First Screening Colonoscopy - Avg.  risk and is 50 yrs.  old or older - No.  Prior Negative Screening - Now for repeat screening. N/A  History of Adenoma - Now for follow-up colonoscopy & has been > or = to 3 yrs.  N/A  Polyps removed today? No Recommend repeat exam, <10 yrs? No ASA CLASS:   Class III INDICATIONS:Evaluation of unexplained GI bleeding and Colorectal Neoplasm Risk Assessment for this procedure is average risk. MEDICATIONS: Monitored anesthesia care and Propofol 200 mg IV DESCRIPTION OF PROCEDURE:   After the risks benefits and alternatives of the procedure were thoroughly explained, informed consent was obtained.  The digital rectal exam revealed no abnormalities of the rectum.   The [id number]  endoscope was introduced through the anus and advanced to the cecum, which was identified by both the appendix and ileocecal valve. No adverse events experienced.   The quality of the prep was excellent. (Suprep was used)  The instrument was then slowly withdrawn as the colon was fully examined. Estimated blood loss is zero unless otherwise noted in this procedure report.    COLON FINDINGS: There was moderate diverticulosis noted in the sigmoid colon with associated patchy erythema (? inflammatory changes), luminal narrowing and muscular hypertrophy. Multiple biopsies obtained. R/O SCAD.  The examination was otherwise normal. Retroflexed views revealed internal Grade I hemorrhoids. The time to cecum = 3.8 Withdrawal time = 12.0   The scope was withdrawn and the procedure completed. COMPLICATIONS: There were no immediate complications.  ENDOSCOPIC IMPRESSION: 1.    Moderate diverticulosis with patchy erythema and muscular hypertrophy in the sigmoid colon 2.   Grade l internal hemorrhoids  RECOMMENDATIONS: 1.  Await pathology results 2.  High fiber diet with liberal fluid intake.  eSigned:  Ladene Artist, MD, [facility] 03/06/2015 2:53 PM

## 2015-03-06 NOTE — Patient Instructions (Signed)
YOU HAD AN ENDOSCOPIC PROCEDURE TODAY AT [facility]:   Refer to the procedure report that was given to you for any specific questions about what was found during the examination.  If the procedure report does not answer your questions, please call your gastroenterologist to clarify.  If you requested that your care partner not be given the details of your procedure findings, then the procedure report has been included in a sealed envelope for you to review at your convenience later.  YOU SHOULD EXPECT: Some feelings of bloating in the abdomen. Passage of more gas than usual.  Walking can help get rid of the air that was put into your GI tract during the procedure and reduce the bloating. If you had a lower endoscopy (such as a colonoscopy or flexible sigmoidoscopy) you may notice spotting of blood in your stool or on the toilet paper. If you underwent a bowel prep for your procedure, you may not have a normal bowel movement for a few days.  Please Note:  You might notice some irritation and congestion in your nose or some drainage.  This is from the oxygen used during your procedure.  There is no need for concern and it should clear up in a day or so.  SYMPTOMS TO REPORT IMMEDIATELY:   Following lower endoscopy (colonoscopy or flexible sigmoidoscopy):  Excessive amounts of blood in the stool  Significant tenderness or worsening of abdominal pains  Swelling of the abdomen that is new, acute  Fever of 100F or higher  For urgent or emergent issues, a gastroenterologist can be reached at any hour by calling [phone].   DIET: Your first meal following the procedure should be a small meal and then it is ok to progress to your normal diet. Heavy or fried foods are harder to digest and may make you feel nauseous or bloated.  Likewise, meals heavy in dairy and vegetables can increase bloating.  Drink plenty of fluids but you should avoid alcoholic beverages for 24  hours.  ACTIVITY:  You should plan to take it easy for the rest of today and you should NOT DRIVE or use heavy machinery until tomorrow (because of the sedation medicines used during the test).    FOLLOW UP: Our staff will call the number listed on your records the next business day following your procedure to check on you and address any questions or concerns that you may have regarding the information given to you following your procedure. If we do not reach you, we will leave a message.  However, if you are feeling well and you are not experiencing any problems, there is no need to return our call.  We will assume that you have returned to your regular daily activities without incident.  If any biopsies were taken you will be contacted by phone or by letter within the next 1-3 weeks.  Please call us at [phone] if you have not heard about the biopsies in 3 weeks.    SIGNATURES/CONFIDENTIALITY: You and/or your care partner have signed paperwork which will be entered into your electronic medical record.  These signatures attest to the fact that that the information above on your After Visit Summary has been reviewed and is understood.  Full responsibility of the confidentiality of this discharge information lies with you and/or your care-partner.  Please review hemorrhoid, diverticulosis, and high fiber diet handouts provided. Biopsy results pending.

## 2015-03-06 NOTE — Progress Notes (Signed)
Called to room to assist during endoscopic procedure.  Patient ID and intended procedure confirmed with present staff. Received instructions for my participation in the procedure from the performing physician.  

## 2015-03-06 NOTE — Progress Notes (Signed)
To PACU awake and alert, pleased with MAC, VSS. Report to RN

## 2015-03-07 ENCOUNTER — Telehealth: Payer: Self-pay

## 2015-03-07 NOTE — Telephone Encounter (Signed)
  Follow up Call-  Call back number 03/06/2015  Post procedure Call Back phone  # [phone]  Permission to leave phone message Yes     Patient questions:  Do you have a fever, pain , or abdominal swelling? No. Pain Score  0 *  Have you tolerated food without any problems? Yes.    Have you been able to return to your normal activities? Yes.    Do you have any questions about your discharge instructions: Diet   No. Medications  No. Follow up visit  No.  Do you have questions or concerns about your Care? No.  Actions: * If pain score is 4 or above: No action needed, pain <4.

## 2015-03-19 ENCOUNTER — Encounter: Payer: Self-pay | Admitting: Gastroenterology

## 2015-03-20 HISTORY — PX: COLONOSCOPY: SHX174

## 2015-03-28 ENCOUNTER — Encounter: Payer: Self-pay | Admitting: Family Medicine

## 2015-04-09 ENCOUNTER — Other Ambulatory Visit: Payer: Self-pay | Admitting: Family Medicine

## 2015-04-09 DIAGNOSIS — I10 Essential (primary) hypertension: Secondary | ICD-10-CM

## 2015-04-09 DIAGNOSIS — E785 Hyperlipidemia, unspecified: Secondary | ICD-10-CM

## 2015-04-09 DIAGNOSIS — K559 Vascular disorder of intestine, unspecified: Secondary | ICD-10-CM

## 2015-04-09 DIAGNOSIS — K529 Noninfective gastroenteritis and colitis, unspecified: Secondary | ICD-10-CM

## 2015-04-09 NOTE — Telephone Encounter (Signed)
plz phone in. 

## 2015-04-09 NOTE — Telephone Encounter (Signed)
Rx called in as directed.   

## 2015-04-10 ENCOUNTER — Other Ambulatory Visit (INDEPENDENT_AMBULATORY_CARE_PROVIDER_SITE_OTHER): Payer: PPO

## 2015-04-10 DIAGNOSIS — E785 Hyperlipidemia, unspecified: Secondary | ICD-10-CM

## 2015-04-10 DIAGNOSIS — I10 Essential (primary) hypertension: Secondary | ICD-10-CM

## 2015-04-10 DIAGNOSIS — K529 Noninfective gastroenteritis and colitis, unspecified: Secondary | ICD-10-CM

## 2015-04-10 LAB — LIPID PANEL
CHOL/HDL RATIO: 3
Cholesterol: 170 mg/dL (ref 0–200)
HDL: 54.2 mg/dL (ref >39.00)
LDL CALC: 76 mg/dL (ref 0–99)
NonHDL: 115.8
Triglycerides: 199 mg/dL — ABNORMAL HIGH (ref 0.0–149.0)
VLDL: 39.8 mg/dL (ref 0.0–40.0)

## 2015-04-10 LAB — COMPREHENSIVE METABOLIC PANEL
ALK PHOS: 101 U/L (ref 39–117)
ALT: 11 U/L (ref 0–35)
AST: 13 U/L (ref 0–37)
Albumin: 3.9 g/dL (ref 3.5–5.2)
BILIRUBIN TOTAL: 0.4 mg/dL (ref 0.2–1.2)
BUN: 11 mg/dL (ref 6–23)
CO2: 26 mEq/L (ref 19–32)
Calcium: 9.6 mg/dL (ref 8.4–10.5)
Chloride: 105 mEq/L (ref 96–112)
Creatinine, Ser: 1.02 mg/dL (ref 0.40–1.20)
GFR: 56.89 mL/min — ABNORMAL LOW (ref >60.00)
Glucose, Bld: 99 mg/dL (ref 70–99)
Potassium: 4.2 mEq/L (ref 3.5–5.1)
Sodium: 138 mEq/L (ref 135–145)
Total Protein: 7.2 g/dL (ref 6.0–8.3)

## 2015-04-10 LAB — TSH: TSH: 1.3 u[IU]/mL (ref 0.35–4.50)

## 2015-04-10 LAB — SEDIMENTATION RATE: Sed Rate: 35 mm/hr — ABNORMAL HIGH (ref 0–22)

## 2015-04-12 ENCOUNTER — Encounter: Payer: Medicare Other | Admitting: Family

## 2015-04-12 ENCOUNTER — Encounter: Payer: Self-pay | Admitting: Family Medicine

## 2015-04-12 ENCOUNTER — Ambulatory Visit (INDEPENDENT_AMBULATORY_CARE_PROVIDER_SITE_OTHER): Payer: PPO | Admitting: Family Medicine

## 2015-04-12 ENCOUNTER — Encounter: Payer: Self-pay | Admitting: *Deleted

## 2015-04-12 VITALS — BP 122/68 | HR 73 | Temp 97.7°F | Ht 63.75 in | Wt 143.0 lb

## 2015-04-12 DIAGNOSIS — K2100 Gastro-esophageal reflux disease with esophagitis, without bleeding: Secondary | ICD-10-CM

## 2015-04-12 DIAGNOSIS — Z0000 Encounter for general adult medical examination without abnormal findings: Secondary | ICD-10-CM | POA: Diagnosis not present

## 2015-04-12 DIAGNOSIS — Z7189 Other specified counseling: Secondary | ICD-10-CM | POA: Insufficient documentation

## 2015-04-12 DIAGNOSIS — K559 Vascular disorder of intestine, unspecified: Secondary | ICD-10-CM | POA: Diagnosis not present

## 2015-04-12 DIAGNOSIS — F4321 Adjustment disorder with depressed mood: Secondary | ICD-10-CM

## 2015-04-12 DIAGNOSIS — E785 Hyperlipidemia, unspecified: Secondary | ICD-10-CM

## 2015-04-12 DIAGNOSIS — I10 Essential (primary) hypertension: Secondary | ICD-10-CM

## 2015-04-12 MED ORDER — DICYCLOMINE HCL 10 MG PO CAPS: 10.0000 mg | ORAL_CAPSULE | Freq: Two times a day (BID) | ORAL | Status: DC | PRN

## 2015-04-12 NOTE — Assessment & Plan Note (Signed)
Overall stable. Klonopin prn helpful. Will continue.

## 2015-04-12 NOTE — Assessment & Plan Note (Signed)
Recent colonoscopy showing focal colitis. Has f/u with GI next month.

## 2015-04-12 NOTE — Assessment & Plan Note (Addendum)
Chronic, stable. Continue atenolol 25mg  once daily.

## 2015-04-12 NOTE — Assessment & Plan Note (Signed)
Advanced directive discussion - Has not set up. Would want husband to be HCPOA. Packet provided.

## 2015-04-12 NOTE — Progress Notes (Signed)
Pre visit review using our clinic review tool, if applicable. No additional management support is needed unless otherwise documented below in the visit note. 

## 2015-04-12 NOTE — Progress Notes (Addendum)
BP 122/68 mmHg  Pulse 73  Temp(Src) 97.7 F (36.5 C) (Oral)  Ht 5' 3.75" (1.619 m)  Wt 143 lb (64.864 kg)  BMI 24.75 kg/m2  SpO2 97%   CC: medicare wellness visit  Subjective:    Patient ID: Bianca Fry, female    DOB: February 15, 1946, 69 y.o.   MRN: [id number]  HPI: Bianca Fry is a 69 y.o. female presenting on 04/12/2015 for Medicare Wellness   Recent colonoscopy for hematochezia - focal colitis. Aspirin on hold since march.   Anxiety - on clonazepam 1/2-1 tab 0.5mg  BID PRN anxiety. Tolerating this ok. Prior on xanax.   On tramadol and tizanadine for chronic back pain. Recent piriformis shot?  Hearing screen - passed Vision screen - with eye doctor Fall risk screen - passed Depression screen - passed  Preventative: COLONOSCOPY Date: 03/2015 mod diverticulosis with focal colitis [facility]) Breast cancer screening - mammo 07/2014 [facility]NL Well woman exam - 12/2014 Dr Josefa Half DEXA scan - 01/2015 - osteopenia T -2.0 in hip Flu shot -07/2014 Tdap 2012 Pneumovax 2010, prevnar 2015 Shingles shot - 07/2010 Advanced directive discussion - Has not set up. Would want husband to be HCPOA. Packet provided. Seat belt use discussed Sunscreen use and skin screen discussed  Daily caffeine Lives with husband and 1 dog and 3 cats Occupation: retired, was in Press photographer Activity: walking Diet: good water, fruits/vegetables daily   Relevant past medical, surgical, family and social history reviewed and updated as indicated. Interim medical history since our last visit reviewed. Allergies and medications reviewed and updated. Current Outpatient Prescriptions on File Prior to Visit  Medication Sig  . atenolol (TENORMIN) 25 MG tablet Take 1 tablet (25 mg total) by mouth daily.  Marland Kitchen atorvastatin (LIPITOR) 20 MG tablet Take 1 tablet (20 mg total) by mouth 2 (two) times a week. On Monday and Friday  . clonazePAM (KLONOPIN) 0.5 MG tablet TAKE ONE-HALF TO ONE TABLET BY MOUTH TWICE DAILY AS NEEDED FOR  ANXIETY  . CRANBERRY PO Take 1 tablet by mouth daily.   . fexofenadine (ALLEGRA) 180 MG tablet Take 180 mg by mouth daily as needed for allergies.   Marland Kitchen omeprazole (PRILOSEC) 40 MG capsule Take 1 capsule (40 mg total) by mouth daily.  . ondansetron (ZOFRAN) 8 MG tablet Take 1 tablet (8 mg total) by mouth every 8 (eight) hours as needed for nausea or vomiting.  . Probiotic Product (PROBIOTIC DAILY PO) Take 1 tablet by mouth daily.  . traMADol (ULTRAM) 50 MG tablet Take 50 mg by mouth every 6 (six) hours as needed (pain).  Marland Kitchen trimethoprim (TRIMPEX) 100 MG tablet Take 100 mg by mouth daily.  Marland Kitchen estradiol (ESTRACE) 0.1 MG/GM vaginal cream Use 1/2 g vaginally every night for the first 2 weeks, then use 1/2 g vaginally two times a week. (Patient not taking: Reported on 04/12/2015)   No current facility-administered medications on file prior to visit.    Review of Systems Per HPI unless specifically indicated above     Objective:    BP 122/68 mmHg  Pulse 73  Temp(Src) 97.7 F (36.5 C) (Oral)  Ht 5' 3.75" (1.619 m)  Wt 143 lb (64.864 kg)  BMI 24.75 kg/m2  SpO2 97%  Wt Readings from Last 3 Encounters:  04/12/15 143 lb (64.864 kg)  03/06/15 144 lb (65.318 kg)  02/28/15 144 lb 3.2 oz (65.409 kg)    Physical Exam  Constitutional: She is oriented to person, place, and time. She appears well-developed and  well-nourished. No distress.  HENT:  Head: Normocephalic and atraumatic.  Right Ear: Hearing, tympanic membrane, external ear and ear canal normal.  Left Ear: Hearing, tympanic membrane, external ear and ear canal normal.  Nose: Nose normal.  Mouth/Throat: Uvula is midline, oropharynx is clear and moist and mucous membranes are normal. No oropharyngeal exudate, posterior oropharyngeal edema or posterior oropharyngeal erythema.  Eyes: Conjunctivae and EOM are normal. Pupils are equal, round, and reactive to light. No scleral icterus.  Neck: Normal range of motion. Neck supple. Carotid bruit is  not present. No thyromegaly present.  Cardiovascular: Normal rate, regular rhythm, normal heart sounds and intact distal pulses.   No murmur heard. Pulses:      Radial pulses are 2+ on the right side, and 2+ on the left side.  Pulmonary/Chest: Effort normal and breath sounds normal. No respiratory distress. She has no wheezes. She has no rales.  Abdominal: Soft. Bowel sounds are normal. She exhibits no distension and no mass. There is no tenderness. There is no rebound and no guarding.  Musculoskeletal: Normal range of motion. She exhibits no edema.  Lymphadenopathy:    She has no cervical adenopathy.  Neurological: She is alert and oriented to person, place, and time.  CN grossly intact, station and gait intact Recall 3/3  Calculation 5/5 serial 7s  Skin: Skin is warm and dry. No rash noted.  Psychiatric: She has a normal mood and affect. Her behavior is normal. Judgment and thought content normal.  Nursing note and vitals reviewed.  Results for orders placed or performed in visit on 04/10/15  Lipid panel  Result Value Ref Range   Cholesterol 170 0 - 200 mg/dL   Triglycerides 199.0 (H) 0.0 - 149.0 mg/dL   HDL 54.20 >39.00 mg/dL   VLDL 39.8 0.0 - 40.0 mg/dL   LDL Cholesterol 76 0 - 99 mg/dL   Total CHOL/HDL Ratio 3    NonHDL 115.80   Comprehensive metabolic panel  Result Value Ref Range   Sodium 138 135 - 145 mEq/L   Potassium 4.2 3.5 - 5.1 mEq/L   Chloride 105 96 - 112 mEq/L   CO2 26 19 - 32 mEq/L   Glucose, Bld 99 70 - 99 mg/dL   BUN 11 6 - 23 mg/dL   Creatinine, Ser 1.02 0.40 - 1.20 mg/dL   Total Bilirubin 0.4 0.2 - 1.2 mg/dL   Alkaline Phosphatase 101 39 - 117 U/L   AST 13 0 - 37 U/L   ALT 11 0 - 35 U/L   Total Protein 7.2 6.0 - 8.3 g/dL   Albumin 3.9 3.5 - 5.2 g/dL   Calcium 9.6 8.4 - 10.5 mg/dL   GFR 56.89 (L) >60.00 mL/min  TSH  Result Value Ref Range   TSH 1.30 0.35 - 4.50 uIU/mL  Sedimentation rate  Result Value Ref Range   Sed Rate 35 (H) 0 - 22 mm/hr       Assessment & Plan:   Problem List Items Addressed This Visit    Adjustment disorder with depressed mood    Overall stable. Klonopin prn helpful. Will continue.      Advanced care planning/counseling discussion    Advanced directive discussion - Has not set up. Would want husband to be HCPOA. Packet provided.      Essential hypertension    Chronic, stable. Continue atenolol 25mg  once daily.      GERD    Continue omeprazole 40mg  daily. Avoiding aspirin and NSAIDs.  Relevant Medications   dicyclomine (BENTYL) 10 MG capsule   HLD (hyperlipidemia)    Chronic, stable. Discussed dietary changes to improve triglyceride levels. Continue lipitor 20mg  daily.      Ischemic colitis    Recent colonoscopy showing focal colitis. Has f/u with GI next month.      Medicare annual wellness visit, subsequent - Primary    I have personally reviewed the Medicare Annual Wellness questionnaire and have noted 1. The patient's medical and social history 2. Their use of alcohol, tobacco or illicit drugs 3. Their current medications and supplements 4. The patient's functional ability including ADL's, fall risks, home safety risks and hearing or visual impairment. Cognitive function has been assessed and addressed as indicated.  5. Diet and physical activity 6. Evidence for depression or mood disorders The patients weight, height, BMI have been recorded in the chart. I have made referrals, counseling and provided education to the patient based on review of the above and I have provided the pt with a written personalized care plan for preventive services. Provider list updated.. See scanned questionairre as needed for further documentation. Reviewed preventative protocols and updated unless pt declined.           Follow up plan: Return in about 1 year (around 04/11/2016), or as needed, for medicare wellnes visit.

## 2015-04-12 NOTE — Assessment & Plan Note (Signed)
Continue omeprazole 40mg  daily. Avoiding aspirin and NSAIDs.

## 2015-04-12 NOTE — Assessment & Plan Note (Signed)

## 2015-04-12 NOTE — Patient Instructions (Addendum)
Advanced directive packet provided today.  Controlled substance agreement today. Decrease added sugars, eliminate trans fats, increase fiber and limit alcohol.  All these changes together can drop triglycerides by almost 50%. Good to see you today, call us with quesitons.  Return as needed or in 1 year for next wellness visit.

## 2015-04-12 NOTE — Assessment & Plan Note (Signed)
Chronic, stable. Discussed dietary changes to improve triglyceride levels. Continue lipitor 20mg  daily.

## 2015-05-02 ENCOUNTER — Encounter: Payer: Self-pay | Admitting: Family Medicine

## 2015-05-09 ENCOUNTER — Ambulatory Visit (INDEPENDENT_AMBULATORY_CARE_PROVIDER_SITE_OTHER): Payer: PPO | Admitting: Gastroenterology

## 2015-05-09 ENCOUNTER — Encounter: Payer: Self-pay | Admitting: Gastroenterology

## 2015-05-09 VITALS — BP 126/60 | HR 80 | Ht 63.5 in | Wt 142.5 lb

## 2015-05-09 DIAGNOSIS — R1032 Left lower quadrant pain: Secondary | ICD-10-CM | POA: Diagnosis not present

## 2015-05-09 DIAGNOSIS — K5730 Diverticulosis of large intestine without perforation or abscess without bleeding: Secondary | ICD-10-CM | POA: Diagnosis not present

## 2015-05-09 NOTE — Progress Notes (Signed)
    History of Present Illness: This is a 69 year old female returning for follow-up of left lower quadrant pain, hematochezia. Recent colonoscopy was unremarkable except for sigmoid colon diverticulosis. Biopsies revealed focal active colitis. She has been feeling quite well for the past several weeks and only notes very brief twinges of left lower quadrant discomfort that last for a few minutes at a time. She states she has had episodes of LLQ discomfort where she has discomfort lasting for several hours that has responded to dicyclomine. She denies any rectal bleeding.  Current Medications, Allergies, Past Medical History, Past Surgical History, Family History and Social History were reviewed in [employer] record.  Physical Exam: General: Well developed , well nourished, no acute distress Head: Normocephalic and atraumatic Eyes:  sclerae anicteric, EOMI Ears: Normal auditory acuity Mouth: No deformity or lesions Lungs: Clear throughout to auscultation Heart: Regular rate and rhythm; no murmurs, rubs or bruits Abdomen: Soft, non tender and non distended. No masses, hepatosplenomegaly or hernias noted. Normal Bowel sounds Musculoskeletal: Symmetrical with no gross deformities  Pulses:  Normal pulses noted Extremities: No clubbing, cyanosis, edema or deformities noted Neurological: Alert oriented x 4, grossly nonfocal Psychological:  Alert and cooperative. Normal mood and affect  Assessment and Recommendations:  1. Multi-factorial LLQ pain. Musculoskeletal pain, radicular pain or referred pain from her lower back appears to be the primary process. One component of her LLQ pain is helped by dicyclomine. Continue dicyclomine as needed. If pain is refractory to dicyclomine she is advised to follow-up with her PCP.  2. Diverticulosis with focally active colitis c/w SCAD. Recommended maintenance treatment with a 5-ASA medication. As she is feeling quite well at this point  she declines. If abdominal pain associated with diarrhea or rectal bleeding recurs will begin a 5-ASA medication.  15 minutes of face-to-face time spent with the patient. Greater than 50% of time was spent counseling and coordinating care.

## 2015-05-09 NOTE — Patient Instructions (Signed)
Thank you for choosing me and [facility].  Malcolm T. Stark, Jr., MD., FACG  

## 2015-05-27 ENCOUNTER — Encounter: Payer: Self-pay | Admitting: Family Medicine

## 2015-05-27 MED ORDER — OMEPRAZOLE 40 MG PO CPDR: 40.0000 mg | DELAYED_RELEASE_CAPSULE | Freq: Every day | ORAL | Status: DC

## 2015-05-29 ENCOUNTER — Other Ambulatory Visit: Payer: Self-pay | Admitting: Family Medicine

## 2015-05-29 NOTE — Telephone Encounter (Signed)
plz phone in. 

## 2015-05-30 NOTE — Telephone Encounter (Signed)
Rx called in as directed.   

## 2015-06-10 DIAGNOSIS — B079 Viral wart, unspecified: Secondary | ICD-10-CM | POA: Insufficient documentation

## 2015-06-21 ENCOUNTER — Other Ambulatory Visit: Payer: Self-pay

## 2015-06-21 DIAGNOSIS — Z1231 Encounter for screening mammogram for malignant neoplasm of breast: Secondary | ICD-10-CM

## 2015-07-22 ENCOUNTER — Other Ambulatory Visit: Payer: Self-pay | Admitting: Family Medicine

## 2015-07-22 ENCOUNTER — Other Ambulatory Visit: Payer: Self-pay | Admitting: Gastroenterology

## 2015-07-22 NOTE — Telephone Encounter (Signed)
Ok tor refill

## 2015-07-23 NOTE — Telephone Encounter (Signed)
Rx called in as directed.   

## 2015-07-23 NOTE — Telephone Encounter (Signed)
plz phone in. 

## 2015-08-01 ENCOUNTER — Ambulatory Visit (INDEPENDENT_AMBULATORY_CARE_PROVIDER_SITE_OTHER): Payer: PPO

## 2015-08-01 DIAGNOSIS — Z23 Encounter for immunization: Secondary | ICD-10-CM

## 2015-08-06 ENCOUNTER — Ambulatory Visit
Admission: RE | Admit: 2015-08-06 | Discharge: 2015-08-06 | Disposition: A | Discharge status: Home / Self Care | Payer: PPO | Source: Ambulatory Visit

## 2015-08-06 DIAGNOSIS — Z1231 Encounter for screening mammogram for malignant neoplasm of breast: Secondary | ICD-10-CM

## 2015-08-12 ENCOUNTER — Other Ambulatory Visit: Payer: Self-pay | Admitting: Family Medicine

## 2015-08-12 DIAGNOSIS — R928 Other abnormal and inconclusive findings on diagnostic imaging of breast: Secondary | ICD-10-CM

## 2015-08-14 ENCOUNTER — Ambulatory Visit
Admission: RE | Admit: 2015-08-14 | Discharge: 2015-08-14 | Disposition: A | Discharge status: Home / Self Care | Payer: PPO | Source: Ambulatory Visit | Attending: Family Medicine | Admitting: Family Medicine

## 2015-08-14 ENCOUNTER — Other Ambulatory Visit: Payer: Self-pay | Admitting: Family Medicine

## 2015-08-14 DIAGNOSIS — R928 Other abnormal and inconclusive findings on diagnostic imaging of breast: Secondary | ICD-10-CM

## 2015-08-19 ENCOUNTER — Ambulatory Visit
Admission: RE | Admit: 2015-08-19 | Discharge: 2015-08-19 | Disposition: A | Discharge status: Home / Self Care | Payer: PPO | Source: Ambulatory Visit | Attending: Family Medicine | Admitting: Family Medicine

## 2015-08-19 ENCOUNTER — Other Ambulatory Visit: Payer: Self-pay | Admitting: Family Medicine

## 2015-08-19 DIAGNOSIS — R928 Other abnormal and inconclusive findings on diagnostic imaging of breast: Secondary | ICD-10-CM

## 2015-09-15 ENCOUNTER — Other Ambulatory Visit: Payer: Self-pay | Admitting: Family Medicine

## 2015-09-15 NOTE — Telephone Encounter (Signed)
plz phone in. 

## 2015-09-16 NOTE — Telephone Encounter (Signed)
Rx called in as directed.   

## 2015-11-14 DIAGNOSIS — S92212A Displaced fracture of cuboid bone of left foot, initial encounter for closed fracture: Secondary | ICD-10-CM | POA: Diagnosis not present

## 2015-11-14 DIAGNOSIS — S82832A Other fracture of upper and lower end of left fibula, initial encounter for closed fracture: Secondary | ICD-10-CM | POA: Diagnosis not present

## 2015-11-14 DIAGNOSIS — S93492A Sprain of other ligament of left ankle, initial encounter: Secondary | ICD-10-CM | POA: Diagnosis not present

## 2015-11-14 DIAGNOSIS — S93402A Sprain of unspecified ligament of left ankle, initial encounter: Secondary | ICD-10-CM | POA: Diagnosis not present

## 2015-11-17 ENCOUNTER — Other Ambulatory Visit: Payer: Self-pay | Admitting: Family Medicine

## 2015-11-18 ENCOUNTER — Other Ambulatory Visit: Payer: Self-pay | Admitting: *Deleted

## 2015-11-18 MED ORDER — ATORVASTATIN CALCIUM 20 MG PO TABS: 20.0000 mg | ORAL_TABLET | ORAL | Status: DC

## 2015-11-18 NOTE — Telephone Encounter (Signed)
plz phone in. 

## 2015-11-18 NOTE — Telephone Encounter (Signed)
Rx called in as directed.   

## 2015-12-01 ENCOUNTER — Other Ambulatory Visit: Payer: Self-pay | Admitting: Family Medicine

## 2015-12-03 DIAGNOSIS — S93402D Sprain of unspecified ligament of left ankle, subsequent encounter: Secondary | ICD-10-CM | POA: Diagnosis not present

## 2015-12-03 DIAGNOSIS — S82832D Other fracture of upper and lower end of left fibula, subsequent encounter for closed fracture with routine healing: Secondary | ICD-10-CM | POA: Diagnosis not present

## 2015-12-03 DIAGNOSIS — M25572 Pain in left ankle and joints of left foot: Secondary | ICD-10-CM | POA: Diagnosis not present

## 2015-12-03 DIAGNOSIS — S92215D Nondisplaced fracture of cuboid bone of left foot, subsequent encounter for fracture with routine healing: Secondary | ICD-10-CM | POA: Diagnosis not present

## 2015-12-09 DIAGNOSIS — N390 Urinary tract infection, site not specified: Secondary | ICD-10-CM | POA: Diagnosis not present

## 2015-12-09 DIAGNOSIS — B9620 Unspecified Escherichia coli [E. coli] as the cause of diseases classified elsewhere: Secondary | ICD-10-CM | POA: Diagnosis not present

## 2015-12-09 DIAGNOSIS — N941 Unspecified dyspareunia: Secondary | ICD-10-CM | POA: Diagnosis not present

## 2015-12-09 DIAGNOSIS — Z0000 Encounter for general adult medical examination without abnormal findings: Secondary | ICD-10-CM | POA: Diagnosis not present

## 2015-12-12 ENCOUNTER — Telehealth: Payer: Self-pay | Admitting: *Deleted

## 2015-12-12 ENCOUNTER — Encounter: Payer: Self-pay | Admitting: Family Medicine

## 2015-12-12 ENCOUNTER — Ambulatory Visit (INDEPENDENT_AMBULATORY_CARE_PROVIDER_SITE_OTHER): Payer: PPO | Admitting: Family Medicine

## 2015-12-12 VITALS — BP 112/68 | HR 69 | Temp 98.0°F | Wt 142.8 lb

## 2015-12-12 DIAGNOSIS — J069 Acute upper respiratory infection, unspecified: Secondary | ICD-10-CM | POA: Insufficient documentation

## 2015-12-12 DIAGNOSIS — R05 Cough: Secondary | ICD-10-CM | POA: Diagnosis not present

## 2015-12-12 DIAGNOSIS — B9789 Other viral agents as the cause of diseases classified elsewhere: Principal | ICD-10-CM

## 2015-12-12 DIAGNOSIS — R059 Cough, unspecified: Secondary | ICD-10-CM

## 2015-12-12 LAB — POCT INFLUENZA A/B
INFLUENZA A, POC: NEGATIVE
Influenza B, POC: NEGATIVE

## 2015-12-12 NOTE — Patient Instructions (Addendum)
Flu swab today You have a viral upper respiratory infection. Antibiotics are not needed for this.  Viral infections usually take 7-10 days to resolve.  The cough can last a few weeks to go away. Push fluids and plenty of rest. May continue advil as needed.  Watch for fever >101, worsening productive cough, or not improving over next week. Call clinic with questions.  Good to see you today. I hope you start feeling better soon.

## 2015-12-12 NOTE — Telephone Encounter (Signed)
Pt was seen today for an acute illness, pt wanted me to ask you a question. Pt said Dr. Darnell Level advised her that she had earwax in her ears and she wanted to know what should she do for them, did she need them cleaned out or should she just use an OTC med, please advise

## 2015-12-12 NOTE — Progress Notes (Signed)
Pre visit review using our clinic review tool, if applicable. No additional management support is needed unless otherwise documented below in the visit note. 

## 2015-12-12 NOTE — Telephone Encounter (Signed)
Pt notified of Dr. Synthia Innocent comments and verbalized understanding

## 2015-12-12 NOTE — Telephone Encounter (Signed)
Would suggest a few drops of mineral oil ever 1-2 wks to loosen wax then will recheck next visit.

## 2015-12-12 NOTE — Addendum Note (Signed)
Addended by: Tammi Sou on: 12/12/2015 12:51 PM   Modules accepted: Orders

## 2015-12-12 NOTE — Assessment & Plan Note (Signed)
Anticipate viral given short duration. No evidence of bacterial infection today. Flu swab negative today.  Supportive care as per instructions. Pt declines Rx cough medication.

## 2015-12-12 NOTE — Progress Notes (Signed)
BP 112/68 mmHg  Pulse 69  Temp(Src) 98 F (36.7 C) (Oral)  Wt 142 lb 12 oz (64.751 kg)  SpO2 97%   CC: cough  Subjective:    Patient ID: Bianca Fry, female    DOB: 10-23-1945, 70 y.o.   MRN: [id number]  HPI: Bianca Fry is a 70 y.o. female presenting on 12/12/2015 for Cough   2d h/o cough, malaise, body aches, feverish, sweats. Dry cough. ST, PNrdainage. + HA.   No significant congestion, wheezing or dyspnea. No ear or tooth pain.   So far has tried advil and vitamin C.   No h/o asthma. No smokers at home.  On trimethoprim 100mg  daily for UTI ppx (this was restarted on Monday) by Dr Louis Meckel.   Relevant past medical, surgical, family and social history reviewed and updated as indicated. Interim medical history since our last visit reviewed. Allergies and medications reviewed and updated. Current Outpatient Prescriptions on File Prior to Visit  Medication Sig  . atenolol (TENORMIN) 25 MG tablet Take 1 tablet (25 mg total) by mouth daily.  Marland Kitchen atorvastatin (LIPITOR) 20 MG tablet Take 1 tablet (20 mg total) by mouth 2 (two) times a week. On Monday and Friday  . clonazePAM (KLONOPIN) 0.5 MG tablet TAKE ONE-HALF TO ONE TABLET BY MOUTH TWICE DAILY AS NEEDED  . CRANBERRY PO Take 1 tablet by mouth daily.   Marland Kitchen dicyclomine (BENTYL) 10 MG capsule Take 1 capsule (10 mg total) by mouth 2 (two) times daily as needed for spasms.  Marland Kitchen estradiol (ESTRACE) 0.1 MG/GM vaginal cream Use 1/2 g vaginally every night for the first 2 weeks, then use 1/2 g vaginally two times a week.  . fexofenadine (ALLEGRA) 180 MG tablet Take 180 mg by mouth daily as needed for allergies.   Marland Kitchen omeprazole (PRILOSEC) 40 MG capsule TAKE ONE CAPSULE BY MOUTH ONCE DAILY  . ondansetron (ZOFRAN) 8 MG tablet TAKE ONE TABLET BY MOUTH EVERY 8 HOURS AS NEEDED FOR NAUSEA AND VOMITING  . Probiotic Product (PROBIOTIC DAILY PO) Take 1 tablet by mouth daily.  Marland Kitchen trimethoprim (TRIMPEX) 100 MG tablet Take 100 mg by mouth daily.   No  current facility-administered medications on file prior to visit.    Review of Systems Per HPI unless specifically indicated in ROS section     Objective:    BP 112/68 mmHg  Pulse 69  Temp(Src) 98 F (36.7 C) (Oral)  Wt 142 lb 12 oz (64.751 kg)  SpO2 97%  Wt Readings from Last 3 Encounters:  12/12/15 142 lb 12 oz (64.751 kg)  05/09/15 142 lb 8 oz (64.638 kg)  04/12/15 143 lb (64.864 kg)    Physical Exam  Constitutional: She appears well-developed and well-nourished. No distress.  Tired but non toxic appearing  HENT:  Head: Normocephalic and atraumatic.  Right Ear: Hearing, tympanic membrane, external ear and ear canal normal.  Left Ear: Hearing, tympanic membrane, external ear and ear canal normal.  Nose: Mucosal edema present. No rhinorrhea. Right sinus exhibits no maxillary sinus tenderness and no frontal sinus tenderness. Left sinus exhibits no maxillary sinus tenderness and no frontal sinus tenderness.  Mouth/Throat: Uvula is midline, oropharynx is clear and moist and mucous membranes are normal. No oropharyngeal exudate, posterior oropharyngeal edema, posterior oropharyngeal erythema or tonsillar abscesses.  Cerumen bilaterally Nasal mucosal congestion  Eyes: Conjunctivae and EOM are normal. Pupils are equal, round, and reactive to light. No scleral icterus.  Neck: Normal range of motion. Neck supple.  Cardiovascular: Normal  rate, regular rhythm, normal heart sounds and intact distal pulses.   No murmur heard. Pulmonary/Chest: Effort normal and breath sounds normal. No respiratory distress. She has no wheezes. She has no rales.  Lymphadenopathy:    She has no cervical adenopathy.  Skin: Skin is warm and dry. No rash noted.  Nursing note and vitals reviewed.     Assessment & Plan:   Problem List Items Addressed This Visit    Viral URI with cough - Primary    Anticipate viral given short duration. No evidence of bacterial infection today. Flu swab negative today.    Supportive care as per instructions. Pt declines Rx cough medication.          Follow up plan: Return if symptoms worsen or fail to improve.

## 2015-12-18 DIAGNOSIS — C4491 Basal cell carcinoma of skin, unspecified: Secondary | ICD-10-CM

## 2015-12-18 HISTORY — DX: Basal cell carcinoma of skin, unspecified: C44.91

## 2015-12-25 DIAGNOSIS — Z85828 Personal history of other malignant neoplasm of skin: Secondary | ICD-10-CM | POA: Diagnosis not present

## 2015-12-25 DIAGNOSIS — L812 Freckles: Secondary | ICD-10-CM | POA: Insufficient documentation

## 2015-12-25 DIAGNOSIS — D2272 Melanocytic nevi of left lower limb, including hip: Secondary | ICD-10-CM | POA: Diagnosis not present

## 2015-12-25 DIAGNOSIS — D489 Neoplasm of uncertain behavior, unspecified: Secondary | ICD-10-CM | POA: Insufficient documentation

## 2015-12-25 DIAGNOSIS — D2271 Melanocytic nevi of right lower limb, including hip: Secondary | ICD-10-CM | POA: Diagnosis not present

## 2015-12-25 DIAGNOSIS — L821 Other seborrheic keratosis: Secondary | ICD-10-CM | POA: Diagnosis not present

## 2015-12-25 DIAGNOSIS — L570 Actinic keratosis: Secondary | ICD-10-CM | POA: Diagnosis not present

## 2015-12-25 DIAGNOSIS — C44519 Basal cell carcinoma of skin of other part of trunk: Secondary | ICD-10-CM | POA: Diagnosis not present

## 2015-12-25 DIAGNOSIS — D2262 Melanocytic nevi of left upper limb, including shoulder: Secondary | ICD-10-CM | POA: Diagnosis not present

## 2016-01-01 ENCOUNTER — Ambulatory Visit: Payer: PPO | Admitting: Obstetrics and Gynecology

## 2016-01-06 ENCOUNTER — Other Ambulatory Visit: Payer: Self-pay | Admitting: Family Medicine

## 2016-01-06 DIAGNOSIS — N6310 Unspecified lump in the right breast, unspecified quadrant: Secondary | ICD-10-CM

## 2016-01-12 ENCOUNTER — Encounter: Payer: Self-pay | Admitting: Family Medicine

## 2016-01-13 ENCOUNTER — Ambulatory Visit (INDEPENDENT_AMBULATORY_CARE_PROVIDER_SITE_OTHER): Payer: PPO | Admitting: Obstetrics and Gynecology

## 2016-01-13 ENCOUNTER — Encounter: Payer: Self-pay | Admitting: Obstetrics and Gynecology

## 2016-01-13 VITALS — BP 140/72 | HR 80 | Resp 16 | Ht 64.0 in | Wt 143.4 lb

## 2016-01-13 DIAGNOSIS — R1900 Intra-abdominal and pelvic swelling, mass and lump, unspecified site: Secondary | ICD-10-CM | POA: Diagnosis not present

## 2016-01-13 DIAGNOSIS — Z01419 Encounter for gynecological examination (general) (routine) without abnormal findings: Secondary | ICD-10-CM | POA: Diagnosis not present

## 2016-01-13 MED ORDER — ESTRADIOL 0.1 MG/GM VA CREA: TOPICAL_CREAM | VAGINAL | Status: DC

## 2016-01-13 NOTE — Progress Notes (Signed)
Pelvic ultrasound scheduled for 01-16-16 at 1100 here in office. Patient agreeable to date and time.
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
## 2016-01-13 NOTE — Patient Instructions (Signed)

## 2016-01-14 ENCOUNTER — Telehealth: Payer: Self-pay | Admitting: Obstetrics and Gynecology

## 2016-01-14 NOTE — Telephone Encounter (Signed)
Spoke with pt regarding benefit for ultrasound. Patient understood and agreeable. Patient ready to schedule. Patient scheduled 01/16/16 with Dr Quincy Simmonds. Pt aware of arrival date and time. Pt aware of 72 hours cancellation policy with [id number] fee. No further questions. Ok to close

## 2016-01-15 DIAGNOSIS — C44519 Basal cell carcinoma of skin of other part of trunk: Secondary | ICD-10-CM | POA: Diagnosis not present

## 2016-01-15 DIAGNOSIS — Z85828 Personal history of other malignant neoplasm of skin: Secondary | ICD-10-CM | POA: Diagnosis not present

## 2016-01-15 DIAGNOSIS — C4491 Basal cell carcinoma of skin, unspecified: Secondary | ICD-10-CM | POA: Insufficient documentation

## 2016-01-16 ENCOUNTER — Encounter: Payer: Self-pay | Admitting: Obstetrics and Gynecology

## 2016-01-16 ENCOUNTER — Ambulatory Visit (INDEPENDENT_AMBULATORY_CARE_PROVIDER_SITE_OTHER): Payer: PPO | Admitting: Obstetrics and Gynecology

## 2016-01-16 ENCOUNTER — Ambulatory Visit (INDEPENDENT_AMBULATORY_CARE_PROVIDER_SITE_OTHER): Payer: PPO

## 2016-01-16 VITALS — BP 140/84 | HR 66 | Ht 64.0 in | Wt 143.0 lb

## 2016-01-16 DIAGNOSIS — R1900 Intra-abdominal and pelvic swelling, mass and lump, unspecified site: Secondary | ICD-10-CM | POA: Diagnosis not present

## 2016-01-16 DIAGNOSIS — R1032 Left lower quadrant pain: Secondary | ICD-10-CM | POA: Diagnosis not present

## 2016-01-16 NOTE — Progress Notes (Signed)
Subjective  70 y.o. G26P1001 Married Caucasian female here for pelvic ultrasound for evaluation of potential left adnexal mass noted on pelvic exam.   Has some LLQ discomfort when she needs to have a bowel movement.   Hx of ischemic colitis.  This has caused significant pain.  Sees Dr. Fuller Plan from GI and Dr. Arnoldo Morale PCP. Dr. Arnoldo Morale has diagnosed IBS.   Taking Bentyl, and this helps the pain.   Has a hx of adhesive disease and had a laparoscopy for evaluate and have adhesiolysis.  Had previously had cholecystectomy.  States it took a long time before someone was willing to do surgery to evaluate and treat the adhesions.   CT of abdomen and pelvis in 2016 for LLQ pain -  hepatic hemangioma, otherwise negative.   No LMP recorded. Patient has had a hysterectomy.  Objective   Pelvic ultrasound images and report reviewed with patient.  Uterus - absent. EMS - NA Ovaries - not seen.  Free fluid - no     Assessment  No pelvic mass. Hx LLQ pain.  Hx of adhesive disease.  Hx ischemic colitis.  Negative CT for pelvic pathology in 2016.   Plan  Discussion of reassuring pelvic ultrasound.  We did have a good discussion about pelvic/intra-abdominal adhesive disease and how this can be diagnosed and treated with surgery. Patient also aware that surgery can cause further adhesive disease to form. I recommend taking the Bentyl if this is doing an adequate job to control the pain.  If it is not, I recommend seeing a general surgeon for discussion of possible laparoscopy.   ___15____ minutes face to face time of which over 50% was spent in counseling.   After visit summary to patient.

## 2016-01-26 ENCOUNTER — Other Ambulatory Visit: Payer: Self-pay | Admitting: Family Medicine

## 2016-02-14 ENCOUNTER — Ambulatory Visit
Admission: RE | Admit: 2016-02-14 | Discharge: 2016-02-14 | Disposition: A | Discharge status: Home / Self Care | Payer: PPO | Source: Ambulatory Visit | Attending: Family Medicine | Admitting: Family Medicine

## 2016-02-14 DIAGNOSIS — N6310 Unspecified lump in the right breast, unspecified quadrant: Secondary | ICD-10-CM

## 2016-02-14 DIAGNOSIS — R922 Inconclusive mammogram: Secondary | ICD-10-CM | POA: Diagnosis not present

## 2016-02-20 ENCOUNTER — Other Ambulatory Visit: Payer: Self-pay | Admitting: Family Medicine

## 2016-03-09 ENCOUNTER — Other Ambulatory Visit: Payer: Self-pay | Admitting: Family Medicine

## 2016-03-09 NOTE — Telephone Encounter (Signed)
Last filled 11-18-15 #40 Last OV Acute 12-12-15 Next OV 04-15-16

## 2016-03-10 NOTE — Telephone Encounter (Signed)
plz phone in. 

## 2016-03-14 ENCOUNTER — Other Ambulatory Visit: Payer: Self-pay | Admitting: Family Medicine

## 2016-03-16 NOTE — Telephone Encounter (Signed)
plz phone in. If not already done

## 2016-03-17 DIAGNOSIS — N390 Urinary tract infection, site not specified: Secondary | ICD-10-CM | POA: Diagnosis not present

## 2016-03-17 DIAGNOSIS — N3020 Other chronic cystitis without hematuria: Secondary | ICD-10-CM | POA: Diagnosis not present

## 2016-03-17 DIAGNOSIS — R3989 Other symptoms and signs involving the genitourinary system: Secondary | ICD-10-CM | POA: Diagnosis not present

## 2016-03-17 DIAGNOSIS — Z0000 Encounter for general adult medical examination without abnormal findings: Secondary | ICD-10-CM | POA: Diagnosis not present

## 2016-03-17 NOTE — Telephone Encounter (Signed)
Rx called to pharmacy as instructed. 

## 2016-03-20 DIAGNOSIS — N390 Urinary tract infection, site not specified: Secondary | ICD-10-CM | POA: Diagnosis not present

## 2016-03-20 DIAGNOSIS — B9620 Unspecified Escherichia coli [E. coli] as the cause of diseases classified elsewhere: Secondary | ICD-10-CM | POA: Diagnosis not present

## 2016-03-23 DIAGNOSIS — N3000 Acute cystitis without hematuria: Secondary | ICD-10-CM | POA: Diagnosis not present

## 2016-04-03 ENCOUNTER — Ambulatory Visit: Payer: PPO

## 2016-04-04 ENCOUNTER — Observation Stay
Admission: EM | Admit: 2016-04-04 | Discharge: 2016-04-04 | Disposition: A | Discharge status: Home / Self Care | Payer: PPO | Attending: Internal Medicine | Admitting: Internal Medicine

## 2016-04-04 ENCOUNTER — Emergency Department: Payer: PPO

## 2016-04-04 DIAGNOSIS — I200 Unstable angina: Secondary | ICD-10-CM | POA: Diagnosis not present

## 2016-04-04 DIAGNOSIS — Z880 Allergy status to penicillin: Secondary | ICD-10-CM | POA: Insufficient documentation

## 2016-04-04 DIAGNOSIS — R748 Abnormal levels of other serum enzymes: Secondary | ICD-10-CM | POA: Diagnosis not present

## 2016-04-04 DIAGNOSIS — X58XXXA Exposure to other specified factors, initial encounter: Secondary | ICD-10-CM | POA: Diagnosis not present

## 2016-04-04 DIAGNOSIS — Z79899 Other long term (current) drug therapy: Secondary | ICD-10-CM | POA: Insufficient documentation

## 2016-04-04 DIAGNOSIS — Z833 Family history of diabetes mellitus: Secondary | ICD-10-CM | POA: Diagnosis not present

## 2016-04-04 DIAGNOSIS — M479 Spondylosis, unspecified: Secondary | ICD-10-CM | POA: Insufficient documentation

## 2016-04-04 DIAGNOSIS — Z818 Family history of other mental and behavioral disorders: Secondary | ICD-10-CM | POA: Diagnosis not present

## 2016-04-04 DIAGNOSIS — Z807 Family history of other malignant neoplasms of lymphoid, hematopoietic and related tissues: Secondary | ICD-10-CM | POA: Diagnosis not present

## 2016-04-04 DIAGNOSIS — Z85828 Personal history of other malignant neoplasm of skin: Secondary | ICD-10-CM | POA: Diagnosis not present

## 2016-04-04 DIAGNOSIS — T7840XA Allergy, unspecified, initial encounter: Secondary | ICD-10-CM | POA: Insufficient documentation

## 2016-04-04 DIAGNOSIS — I10 Essential (primary) hypertension: Secondary | ICD-10-CM | POA: Diagnosis not present

## 2016-04-04 DIAGNOSIS — E785 Hyperlipidemia, unspecified: Secondary | ICD-10-CM | POA: Diagnosis not present

## 2016-04-04 DIAGNOSIS — Z888 Allergy status to other drugs, medicaments and biological substances status: Secondary | ICD-10-CM | POA: Diagnosis not present

## 2016-04-04 DIAGNOSIS — F419 Anxiety disorder, unspecified: Secondary | ICD-10-CM | POA: Diagnosis not present

## 2016-04-04 DIAGNOSIS — Z823 Family history of stroke: Secondary | ICD-10-CM | POA: Diagnosis not present

## 2016-04-04 DIAGNOSIS — R0789 Other chest pain: Secondary | ICD-10-CM | POA: Diagnosis not present

## 2016-04-04 DIAGNOSIS — Z820 Family history of epilepsy and other diseases of the nervous system: Secondary | ICD-10-CM | POA: Diagnosis not present

## 2016-04-04 DIAGNOSIS — K589 Irritable bowel syndrome without diarrhea: Secondary | ICD-10-CM | POA: Diagnosis not present

## 2016-04-04 DIAGNOSIS — M19011 Primary osteoarthritis, right shoulder: Secondary | ICD-10-CM | POA: Diagnosis not present

## 2016-04-04 DIAGNOSIS — Z87440 Personal history of urinary (tract) infections: Secondary | ICD-10-CM | POA: Insufficient documentation

## 2016-04-04 DIAGNOSIS — M19012 Primary osteoarthritis, left shoulder: Secondary | ICD-10-CM | POA: Insufficient documentation

## 2016-04-04 DIAGNOSIS — R05 Cough: Secondary | ICD-10-CM | POA: Diagnosis not present

## 2016-04-04 DIAGNOSIS — R7989 Other specified abnormal findings of blood chemistry: Secondary | ICD-10-CM

## 2016-04-04 DIAGNOSIS — M19041 Primary osteoarthritis, right hand: Secondary | ICD-10-CM | POA: Insufficient documentation

## 2016-04-04 DIAGNOSIS — R079 Chest pain, unspecified: Secondary | ICD-10-CM | POA: Diagnosis not present

## 2016-04-04 DIAGNOSIS — Z885 Allergy status to narcotic agent status: Secondary | ICD-10-CM | POA: Diagnosis not present

## 2016-04-04 DIAGNOSIS — Z8249 Family history of ischemic heart disease and other diseases of the circulatory system: Secondary | ICD-10-CM | POA: Diagnosis not present

## 2016-04-04 DIAGNOSIS — R778 Other specified abnormalities of plasma proteins: Secondary | ICD-10-CM

## 2016-04-04 DIAGNOSIS — Z90710 Acquired absence of both cervix and uterus: Secondary | ICD-10-CM | POA: Insufficient documentation

## 2016-04-04 DIAGNOSIS — K219 Gastro-esophageal reflux disease without esophagitis: Secondary | ICD-10-CM | POA: Insufficient documentation

## 2016-04-04 LAB — CBC WITH DIFFERENTIAL/PLATELET
Basophils Absolute: 0.1 10*3/uL (ref 0–0.1)
Basophils Relative: 1 %
EOS ABS: 0.2 10*3/uL (ref 0–0.7)
HCT: 36.5 % (ref 35.0–47.0)
Hemoglobin: 12.3 g/dL (ref 12.0–16.0)
LYMPHS ABS: 1 10*3/uL (ref 1.0–3.6)
Lymphocytes Relative: 8 %
MCH: 27.6 pg (ref 26.0–34.0)
MCHC: 33.6 g/dL (ref 32.0–36.0)
MCV: 82.3 fL (ref 80.0–100.0)
MONO ABS: 1 10*3/uL — AB (ref 0.2–0.9)
Monocytes Relative: 8 %
Neutro Abs: 10.5 10*3/uL — ABNORMAL HIGH (ref 1.4–6.5)
Neutrophils Relative %: 82 %
Platelets: 179 10*3/uL (ref 150–440)
RBC: 4.44 MIL/uL (ref 3.80–5.20)
RDW: 14 % (ref 11.5–14.5)
WBC: 12.7 10*3/uL — AB (ref 3.6–11.0)

## 2016-04-04 LAB — TROPONIN I
TROPONIN I: 0.06 ng/mL — AB (ref <0.031)
Troponin I: 0.06 ng/mL — ABNORMAL HIGH (ref <0.031)

## 2016-04-04 LAB — TSH: TSH: 1.816 u[IU]/mL (ref 0.350–4.500)

## 2016-04-04 LAB — COMPREHENSIVE METABOLIC PANEL
ALBUMIN: 4.3 g/dL (ref 3.5–5.0)
ALK PHOS: 93 U/L (ref 38–126)
ALT: 15 U/L (ref 14–54)
AST: 20 U/L (ref 15–41)
Anion gap: 10 (ref 5–15)
BILIRUBIN TOTAL: 0.5 mg/dL (ref 0.3–1.2)
BUN: 16 mg/dL (ref 6–20)
CO2: 23 mmol/L (ref 22–32)
CREATININE: 1.02 mg/dL — AB (ref 0.44–1.00)
Calcium: 9.5 mg/dL (ref 8.9–10.3)
Chloride: 105 mmol/L (ref 101–111)
GFR calc Af Amer: >60 mL/min (ref >60)
GFR, EST NON AFRICAN AMERICAN: 54 mL/min — AB (ref >60)
GLUCOSE: 109 mg/dL — AB (ref 65–99)
Potassium: 3.9 mmol/L (ref 3.5–5.1)
Sodium: 138 mmol/L (ref 135–145)
TOTAL PROTEIN: 7.8 g/dL (ref 6.5–8.1)

## 2016-04-04 LAB — BRAIN NATRIURETIC PEPTIDE: B Natriuretic Peptide: 63 pg/mL (ref 0.0–100.0)

## 2016-04-04 LAB — HEMOGLOBIN A1C: Hgb A1c MFr Bld: 6 % (ref 4.0–6.0)

## 2016-04-04 MED ORDER — NITROGLYCERIN 0.4 MG SL SUBL: 0.4000 mg | SUBLINGUAL_TABLET | SUBLINGUAL | Status: DC | PRN

## 2016-04-04 MED ORDER — ASPIRIN 81 MG PO CHEW
324.0000 mg | CHEWABLE_TABLET | Freq: Once | ORAL | Status: AC
  Administered 2016-04-04: 324 mg via ORAL
  Filled 2016-04-04: qty 4

## 2016-04-04 MED ORDER — ALBUTEROL SULFATE (2.5 MG/3ML) 0.083% IN NEBU
5.0000 mg | INHALATION_SOLUTION | Freq: Once | RESPIRATORY_TRACT | Status: AC
  Administered 2016-04-04: 5 mg via RESPIRATORY_TRACT
  Filled 2016-04-04: qty 6

## 2016-04-04 MED ORDER — CLONAZEPAM 0.5 MG PO TABS: 0.5000 mg | ORAL_TABLET | Freq: Two times a day (BID) | ORAL | Status: DC | PRN

## 2016-04-04 MED ORDER — ACETAMINOPHEN 325 MG PO TABS
650.0000 mg | ORAL_TABLET | Freq: Four times a day (QID) | ORAL | Status: DC | PRN
  Administered 2016-04-04: 650 mg via ORAL

## 2016-04-04 MED ORDER — LORATADINE 10 MG PO TABS
10.0000 mg | ORAL_TABLET | Freq: Every day | ORAL | Status: DC
  Administered 2016-04-04: 10 mg via ORAL
  Filled 2016-04-04: qty 1

## 2016-04-04 MED ORDER — ASPIRIN EC 81 MG PO TBEC
81.0000 mg | DELAYED_RELEASE_TABLET | Freq: Every day | ORAL | Status: DC
  Administered 2016-04-04: 81 mg via ORAL
  Filled 2016-04-04: qty 1

## 2016-04-04 MED ORDER — ONDANSETRON HCL 4 MG/2ML IJ SOLN: 4.0000 mg | Freq: Four times a day (QID) | INTRAMUSCULAR | Status: DC | PRN

## 2016-04-04 MED ORDER — PANTOPRAZOLE SODIUM 40 MG PO TBEC
40.0000 mg | DELAYED_RELEASE_TABLET | Freq: Every day | ORAL | Status: DC
  Administered 2016-04-04: 40 mg via ORAL
  Filled 2016-04-04: qty 1

## 2016-04-04 MED ORDER — ISOSORBIDE MONONITRATE ER 30 MG PO TB24
30.0000 mg | ORAL_TABLET | Freq: Every day | ORAL | Status: DC
  Administered 2016-04-04: 30 mg via ORAL
  Filled 2016-04-04: qty 1

## 2016-04-04 MED ORDER — DOCUSATE SODIUM 100 MG PO CAPS
100.0000 mg | ORAL_CAPSULE | Freq: Two times a day (BID) | ORAL | Status: DC
  Filled 2016-04-04: qty 1

## 2016-04-04 MED ORDER — ALBUTEROL SULFATE (2.5 MG/3ML) 0.083% IN NEBU
INHALATION_SOLUTION | RESPIRATORY_TRACT | Status: AC
  Administered 2016-04-04: 5 mg via RESPIRATORY_TRACT
  Filled 2016-04-04: qty 3

## 2016-04-04 MED ORDER — SODIUM CHLORIDE 0.9% FLUSH: 3.0000 mL | Freq: Two times a day (BID) | INTRAVENOUS | Status: DC

## 2016-04-04 MED ORDER — CLOPIDOGREL BISULFATE 75 MG PO TABS: 75.0000 mg | ORAL_TABLET | Freq: Every day | ORAL | Status: DC

## 2016-04-04 MED ORDER — ASPIRIN 81 MG PO TBEC: 81.0000 mg | DELAYED_RELEASE_TABLET | Freq: Every day | ORAL | Status: DC

## 2016-04-04 MED ORDER — TRIMETHOPRIM 100 MG PO TABS
100.0000 mg | ORAL_TABLET | Freq: Every day | ORAL | Status: DC
  Filled 2016-04-04: qty 1

## 2016-04-04 MED ORDER — ATORVASTATIN CALCIUM 20 MG PO TABS: 20.0000 mg | ORAL_TABLET | ORAL | Status: DC

## 2016-04-04 MED ORDER — CLOPIDOGREL BISULFATE 75 MG PO TABS
300.0000 mg | ORAL_TABLET | Freq: Once | ORAL | Status: AC
  Administered 2016-04-04: 300 mg via ORAL
  Filled 2016-04-04: qty 4

## 2016-04-04 MED ORDER — ESTRADIOL 0.1 MG/GM VA CREA
1.0000 | TOPICAL_CREAM | VAGINAL | Status: DC
  Filled 2016-04-04: qty 42.5

## 2016-04-04 MED ORDER — ENOXAPARIN SODIUM 40 MG/0.4ML ~~LOC~~ SOLN: 40.0000 mg | SUBCUTANEOUS | Status: DC

## 2016-04-04 MED ORDER — DICYCLOMINE HCL 10 MG PO CAPS
10.0000 mg | ORAL_CAPSULE | Freq: Two times a day (BID) | ORAL | Status: DC | PRN
  Filled 2016-04-04: qty 1

## 2016-04-04 MED ORDER — ONDANSETRON HCL 4 MG PO TABS: 4.0000 mg | ORAL_TABLET | Freq: Four times a day (QID) | ORAL | Status: DC | PRN

## 2016-04-04 MED ORDER — ACETAMINOPHEN 650 MG RE SUPP: 650.0000 mg | Freq: Four times a day (QID) | RECTAL | Status: DC | PRN

## 2016-04-04 MED ORDER — SODIUM CHLORIDE 0.9 % IV SOLN
INTRAVENOUS | Status: DC
  Administered 2016-04-04: 07:00:00 via INTRAVENOUS

## 2016-04-04 MED ORDER — ATENOLOL 25 MG PO TABS
25.0000 mg | ORAL_TABLET | Freq: Every day | ORAL | Status: DC
  Administered 2016-04-04: 25 mg via ORAL
  Filled 2016-04-04: qty 1

## 2016-04-04 MED ORDER — MORPHINE SULFATE (PF) 2 MG/ML IV SOLN: 1.0000 mg | INTRAVENOUS | Status: DC | PRN

## 2016-04-04 MED ORDER — ISOSORBIDE MONONITRATE ER 30 MG PO TB24: 30.0000 mg | ORAL_TABLET | Freq: Every day | ORAL | Status: DC

## 2016-04-04 NOTE — Progress Notes (Signed)
Patient given discharge teaching and paperwork regarding medications, diet, follow-up appointments and activity. Patient understanding verbalized. No complaints at this time. IV and telemetry discontinued prior to leaving. Skin assessment as previously charted and vitals are stable; on room air. Patient being discharged to home. Caregiver/family present during discharge teaching. Prescriptions (asprin, nitro, plavix, imdur) printed and given to patient with education. Escorted via wheelchair to ride home with husband.

## 2016-04-04 NOTE — ED Notes (Signed)
Pt reports she has been having issues with a uti for weeks. Yesterday she was started on nitrofurantoin. Pt reports she told the nurse at the doctors office and the pharmacy that she has reaction to the nitrofurantoin but they gave it to her anyway. Pt reports on Thursday night after taking the medication she developed a slight wheeze and a cough. Tonight she developed worse wheezing and chest tightness.

## 2016-04-04 NOTE — Care Management Obs Status (Signed)
[facility] NOTIFICATION   Patient Details  Name: Bianca Fry MRN: [id number] Date of Birth: Dec 14, 1945   Medicare Observation Status Notification Given:  Yes    Ival Bible, RN 04/04/2016, 5:52 AM
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
## 2016-04-04 NOTE — Discharge Instructions (Signed)
Heart healthy diet. °Activity as tolerated. °

## 2016-04-04 NOTE — ED Notes (Signed)
Pt states central chest pain that began tonight around 1900 after taking macrobid. Pt states the chest pain radiated to central neck and down left arm. Pt states she felt shob and was "wheezing" with the onset of chest pain. Pt's spouse states "it's the medicine, i know it is." pt appears in no acute distress. Pt states did not feel dizzy or have diaphoresis, nausea or vomiting with onset of chest pain. Pt denies rash or angioedema symptoms.

## 2016-04-04 NOTE — Progress Notes (Signed)
Dr. Humphrey Rolls rounding now. Orders for heart healthy diet.

## 2016-04-04 NOTE — ED Notes (Signed)
Critical troponin of 0.06 called from lab. Dr. Beather Arbour notified, no new orders received.
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
## 2016-04-04 NOTE — Progress Notes (Signed)
Per Dr. Humphrey Rolls, orders for imdur 30mg  daily, plavix 300mg  once followed by plavix 75mg  daily starting tomorrow.
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
## 2016-04-04 NOTE — ED Notes (Signed)
Pt up to restroom for urine sample
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
## 2016-04-04 NOTE — ED Notes (Signed)
Pt updated on treatment process. Pt verbalizes understanding.  

## 2016-04-06 DIAGNOSIS — I10 Essential (primary) hypertension: Secondary | ICD-10-CM | POA: Diagnosis not present

## 2016-04-06 DIAGNOSIS — R072 Precordial pain: Secondary | ICD-10-CM | POA: Diagnosis not present

## 2016-04-06 DIAGNOSIS — R079 Chest pain, unspecified: Secondary | ICD-10-CM | POA: Diagnosis not present

## 2016-04-06 DIAGNOSIS — E782 Mixed hyperlipidemia: Secondary | ICD-10-CM | POA: Diagnosis not present

## 2016-04-07 DIAGNOSIS — R072 Precordial pain: Secondary | ICD-10-CM | POA: Diagnosis not present

## 2016-04-07 DIAGNOSIS — E782 Mixed hyperlipidemia: Secondary | ICD-10-CM | POA: Diagnosis not present

## 2016-04-07 DIAGNOSIS — I10 Essential (primary) hypertension: Secondary | ICD-10-CM | POA: Diagnosis not present

## 2016-04-07 DIAGNOSIS — R079 Chest pain, unspecified: Secondary | ICD-10-CM | POA: Diagnosis not present

## 2016-04-09 ENCOUNTER — Other Ambulatory Visit: Payer: PPO

## 2016-04-09 DIAGNOSIS — I10 Essential (primary) hypertension: Secondary | ICD-10-CM | POA: Diagnosis not present

## 2016-04-09 DIAGNOSIS — E782 Mixed hyperlipidemia: Secondary | ICD-10-CM | POA: Diagnosis not present

## 2016-04-09 DIAGNOSIS — I2510 Atherosclerotic heart disease of native coronary artery without angina pectoris: Secondary | ICD-10-CM | POA: Diagnosis not present

## 2016-04-12 ENCOUNTER — Other Ambulatory Visit: Payer: Self-pay | Admitting: Family Medicine

## 2016-04-12 DIAGNOSIS — E785 Hyperlipidemia, unspecified: Secondary | ICD-10-CM

## 2016-04-12 DIAGNOSIS — Z1159 Encounter for screening for other viral diseases: Secondary | ICD-10-CM

## 2016-04-13 ENCOUNTER — Ambulatory Visit (INDEPENDENT_AMBULATORY_CARE_PROVIDER_SITE_OTHER): Payer: PPO

## 2016-04-13 ENCOUNTER — Other Ambulatory Visit (INDEPENDENT_AMBULATORY_CARE_PROVIDER_SITE_OTHER): Payer: PPO

## 2016-04-13 VITALS — BP 118/70 | HR 77 | Temp 97.4°F | Ht 63.5 in | Wt 142.0 lb

## 2016-04-13 DIAGNOSIS — Z0000 Encounter for general adult medical examination without abnormal findings: Secondary | ICD-10-CM | POA: Diagnosis not present

## 2016-04-13 DIAGNOSIS — R748 Abnormal levels of other serum enzymes: Secondary | ICD-10-CM | POA: Diagnosis not present

## 2016-04-13 DIAGNOSIS — E785 Hyperlipidemia, unspecified: Secondary | ICD-10-CM

## 2016-04-13 DIAGNOSIS — Z1159 Encounter for screening for other viral diseases: Secondary | ICD-10-CM

## 2016-04-13 LAB — LIPID PANEL
CHOL/HDL RATIO: 4
CHOLESTEROL: 184 mg/dL (ref 0–200)
HDL: 47.3 mg/dL (ref >39.00)
NonHDL: 136.66
Triglycerides: 255 mg/dL — ABNORMAL HIGH (ref 0.0–149.0)
VLDL: 51 mg/dL — AB (ref 0.0–40.0)

## 2016-04-13 LAB — TROPONIN I: TNIDX: 0.08 ug/L — AB (ref 0.00–0.06)

## 2016-04-13 LAB — LDL CHOLESTEROL, DIRECT: LDL DIRECT: 93 mg/dL

## 2016-04-13 NOTE — Progress Notes (Signed)
I reviewed health advisor's note, was available for consultation, and agree with documentation and plan.  

## 2016-04-13 NOTE — Progress Notes (Signed)
Pre visit review using our clinic review tool, if applicable. No additional management support is needed unless otherwise documented below in the visit note. 

## 2016-04-13 NOTE — Progress Notes (Signed)
PCP notes:  Health maintenance:  Hep C screening - completed PPSV23 - postponed/ will discuss with PCP at CPE  Abnormal screenings:  Hearing- failed  Patient concerns: Pt verbalized concerns over recent cardiac diagnostic testing. Additionally, pt feels she has a UTI but is afraid to take prescribed antibiotic. PCP notified. Pt was encouraged to contact urologist to discuss treatment of UTI.   Nurse concerns: None  Next PCP appt: 04/15/16 @ 0930
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
## 2016-04-13 NOTE — Patient Instructions (Signed)
Bianca Fry , Thank you for taking time to come for your Medicare Wellness Visit. I appreciate your ongoing commitment to your health goals. Please review the following plan we discussed and let me know if I can assist you in the future.   These are the goals we discussed: Goals    . Increase physical activity     Starting 04/13/2016, I will continue to walk at least 30 min 5 days per week.         This is a list of the screening recommended for you and due dates:  Health Maintenance  Topic Date Due  . Flu Shot  05/19/2016  . Mammogram  08/05/2017  . Tetanus Vaccine  04/08/2021  . Colon Cancer Screening  03/05/2025  . DTaP/Tdap/Td vaccine  Completed  . DEXA scan (bone density measurement)  Completed  . Shingles Vaccine  Completed  .  Hepatitis C: One time screening is recommended by Center for Disease Control  (CDC) for  adults born from [birth year] through 1966.   Completed  . Pneumonia vaccines  Completed    Preventive Care for Adults  A healthy lifestyle and preventive care can promote health and wellness. Preventive health guidelines for adults include the following key practices.  . A routine yearly physical is a good way to check with your health care provider about your health and preventive screening. It is a chance to share any concerns and updates on your health and to receive a thorough exam.  . Visit your dentist for a routine exam and preventive care every 6 months. Brush your teeth twice a day and floss once a day. Good oral hygiene prevents tooth decay and gum disease.  . The frequency of eye exams is based on your age, health, family medical history, use  of contact lenses, and other factors. Follow your health care provider's ecommendations for frequency of eye exams.  . Eat a healthy diet. Foods like vegetables, fruits, whole grains, low-fat dairy products, and lean protein foods contain the nutrients you need without too many calories. Decrease your intake of foods high  in solid fats, added sugars, and salt. Eat the right amount of calories for you. Get information about a proper diet from your health care provider, if necessary.  . Regular physical exercise is one of the most important things you can do for your health. Most adults should get at least 150 minutes of moderate-intensity exercise (any activity that increases your heart rate and causes you to sweat) each week. In addition, most adults need muscle-strengthening exercises on 2 or more days a week.  Silver Sneakers may be a benefit available to you. To determine eligibility, you may visit the website: www.silversneakers.com or contact program at 320-334-7519 Mon-Fri between 8AM-8PM.   . Maintain a healthy weight. The body mass index (BMI) is a screening tool to identify possible weight problems. It provides an estimate of body fat based on height and weight. Your health care provider can find your BMI and can help you achieve or maintain a healthy weight.   For adults 20 years and older: ? A BMI below 18.5 is considered underweight. ? A BMI of 18.5 to 24.9 is normal. ? A BMI of 25 to 29.9 is considered overweight. ? A BMI of 30 and above is considered obese.   . Maintain normal blood lipids and cholesterol levels by exercising and minimizing your intake of saturated fat. Eat a balanced diet with plenty of fruit and vegetables. Blood tests  for lipids and cholesterol should begin at age 68 and be repeated every 5 years. If your lipid or cholesterol levels are high, you are over 50, or you are at high risk for heart disease, you may need your cholesterol levels checked more frequently. Ongoing high lipid and cholesterol levels should be treated with medicines if diet and exercise are not working.  . If you smoke, find out from your health care provider how to quit. If you do not use tobacco, please do not start.  . If you choose to drink alcohol, please do not consume more than 2 drinks per day. One  drink is considered to be 12 ounces (355 mL) of beer, 5 ounces (148 mL) of wine, or 1.5 ounces (44 mL) of liquor.  . If you are 47-41 years old, ask your health care provider if you should take aspirin to prevent strokes.  . Use sunscreen. Apply sunscreen liberally and repeatedly throughout the day. You should seek shade when your shadow is shorter than you. Protect yourself by wearing long sleeves, pants, a wide-brimmed hat, and sunglasses year round, whenever you are outdoors.  . Once a month, do a whole body skin exam, using a mirror to look at the skin on your back. Tell your health care provider of new moles, moles that have irregular borders, moles that are larger than a pencil eraser, or moles that have changed in shape or color.

## 2016-04-14 LAB — HEPATITIS C ANTIBODY: HCV Ab: NEGATIVE

## 2016-04-15 ENCOUNTER — Ambulatory Visit (INDEPENDENT_AMBULATORY_CARE_PROVIDER_SITE_OTHER): Payer: PPO | Admitting: Family Medicine

## 2016-04-15 ENCOUNTER — Other Ambulatory Visit: Payer: Self-pay | Admitting: *Deleted

## 2016-04-15 ENCOUNTER — Encounter: Payer: Self-pay | Admitting: Family Medicine

## 2016-04-15 VITALS — BP 134/68 | HR 67 | Temp 98.1°F | Ht 63.5 in | Wt 143.5 lb

## 2016-04-15 DIAGNOSIS — I2510 Atherosclerotic heart disease of native coronary artery without angina pectoris: Secondary | ICD-10-CM | POA: Insufficient documentation

## 2016-04-15 DIAGNOSIS — Z23 Encounter for immunization: Secondary | ICD-10-CM | POA: Diagnosis not present

## 2016-04-15 DIAGNOSIS — Z0000 Encounter for general adult medical examination without abnormal findings: Secondary | ICD-10-CM | POA: Insufficient documentation

## 2016-04-15 DIAGNOSIS — I10 Essential (primary) hypertension: Secondary | ICD-10-CM | POA: Diagnosis not present

## 2016-04-15 DIAGNOSIS — N390 Urinary tract infection, site not specified: Secondary | ICD-10-CM

## 2016-04-15 DIAGNOSIS — I25119 Atherosclerotic heart disease of native coronary artery with unspecified angina pectoris: Secondary | ICD-10-CM

## 2016-04-15 DIAGNOSIS — Z7189 Other specified counseling: Secondary | ICD-10-CM

## 2016-04-15 DIAGNOSIS — E785 Hyperlipidemia, unspecified: Secondary | ICD-10-CM

## 2016-04-15 MED ORDER — DICYCLOMINE HCL 10 MG PO CAPS: 10.0000 mg | ORAL_CAPSULE | Freq: Two times a day (BID) | ORAL | Status: DC | PRN

## 2016-04-15 NOTE — Assessment & Plan Note (Addendum)
Reviewed and discussed recent workup by Dr Humphrey Rolls. Nonobstructive CAD rec medical management. Questions answered.  Recommended mediterranean diet. LDL goal <100.

## 2016-04-15 NOTE — Assessment & Plan Note (Signed)
Preventative protocols reviewed and updated unless pt declined. Discussed healthy diet and lifestyle.  

## 2016-04-15 NOTE — Addendum Note (Signed)
Addended by: Ria Bush on: 04/15/2016 01:11 PM   Modules accepted: Miquel Dunn

## 2016-04-15 NOTE — Assessment & Plan Note (Signed)
Advanced directive discussion - Has not set up. Would want husband to be HCPOA. Packet provided. 

## 2016-04-15 NOTE — Assessment & Plan Note (Signed)
Chronic, stable. Continue current regimen. 

## 2016-04-15 NOTE — Addendum Note (Signed)
Addended by: Tammi Sou on: 04/15/2016 10:38 AM   Modules accepted: Orders

## 2016-04-15 NOTE — Progress Notes (Signed)
Pre visit review using our clinic review tool, if applicable. No additional management support is needed unless otherwise documented below in the visit note. 

## 2016-04-15 NOTE — Progress Notes (Signed)
BP 134/68 mmHg  Pulse 67  Temp(Src) 98.1 F (36.7 C) (Oral)  Ht 5' 3.5" (1.613 m)  Wt 143 lb 8 oz (65.091 kg)  BMI 25.02 kg/m2  SpO2 95%   CC: CPE  Subjective:    Patient ID: Bianca Fry, female    DOB: 03-17-1946, 70 y.o.   MRN: [id number]  HPI: Bianca Fry is a 70 y.o. female presenting on 04/15/2016 for Annual Exam   Saw Bianca Fry Monday for medicare wellness visit, note reviewed.  Recent hospitalization (24hr observation) for atypical chest pain with elevated troponins. D/C summary reviewed. Chest pain deemed due to allergic reaction to macrobid (recently started for UTI prevention by urology). Elevated troponin deemed due to demand ischemia from increased work of breathing. Patient was started on aspirin and plavix, along with imdur 30mg  daily.   She had outpatient cardiac CT angio by Bianca Fry - reviewed: right dominant system, mild mid RCA disease with minor luminal irregularities of LAD and LCx (all <50%), recommended to treat medically. Calcium score was 154. Echo showed normal LV systolic function EF [id number], normal wall montion, grade 1 diastolic dysfunction, moderate pulm insufficiency and mild TR/MR. Goal LDL <100. Aspirin restarted, plavix stopped, imdur stopped. She also had stress test done - I don't have records of this.   Macrobid was started by Bianca Fry urologist for treatment of UTI. Monurol was too expensive - but ended up filling. She regularly takes trimethoprim for daily prevention.   Preventative: COLONOSCOPY Date: 03/2015 mod diverticulosis with focal colitis Bianca Fry) rpt 10 yrs Breast cancer screening - mammo dx R breast 01/2016 - rpt 6 mo (irregularities if architecture) bilateral dx.  Well woman exam - yearly Bianca Fry DEXA scan - 01/2015 - osteopenia T -2.0 in hip Flu shot yearly  Tdap 2012  Pneumovax 2010, prevnar 2015, pnemovax today Shingles shot - 07/2010  Advanced directive discussion - Has not set up. Would want husband to be HCPOA. Packet provided    Seat belt use discussed  Sunscreen use and skin screen discussed   Daily caffeine Lives with husband and 1 dog and 3 cats Occupation: retired, was in [employer] photographer Activity: walking Diet: good water, fruits/vegetables daily   Relevant past medical, surgical, family and social history reviewed and updated as indicated. Interim medical history since our last visit reviewed. Allergies and medications reviewed and updated. Current Outpatient Prescriptions on File Prior to Visit  Medication Sig  . aspirin EC 81 MG EC tablet Take 1 tablet (81 mg total) by mouth daily.  Marland Kitchen atenolol (TENORMIN) 25 MG tablet TAKE ONE TABLET BY MOUTH ONCE DAILY  . atorvastatin (LIPITOR) 20 MG tablet Take 1 tablet (20 mg total) by mouth 2 (two) times a week. On Monday and Friday  . clonazePAM (KLONOPIN) 0.5 MG tablet TAKE ONE-Fry TO ONE TABLET BY MOUTH TWICE DAILY AS NEEDED  . CRANBERRY PO Take 1 tablet by mouth daily.   Marland Kitchen dicyclomine (BENTYL) 10 MG capsule Take 1 capsule (10 mg total) by mouth 2 (two) times daily as needed for spasms.  Marland Kitchen estradiol (ESTRACE) 0.1 MG/GM vaginal cream Use 1/2 g vaginally every night for the first 2 weeks, then use 1/2 g vaginally two to three times a week.  . fexofenadine (ALLEGRA) 180 MG tablet Take 180 mg by mouth daily as needed for allergies.   Marland Kitchen omeprazole (PRILOSEC) 40 MG capsule TAKE ONE CAPSULE BY MOUTH ONCE DAILY  . ondansetron (ZOFRAN) 8 MG tablet TAKE ONE TABLET BY MOUTH EVERY  8 HOURS AS NEEDED FOR NAUSEA AND VOMITING  . Probiotic Product (PROBIOTIC DAILY PO) Take 1 tablet by mouth daily. Nature's way  . trimethoprim (TRIMPEX) 100 MG tablet Take 100 mg by mouth daily. Reported on 04/13/2016   No current facility-administered medications on file prior to visit.    Review of Systems  Constitutional: Negative for fever, chills, activity change, appetite change, fatigue and unexpected weight change.  HENT: Negative for hearing loss.   Eyes: Negative for visual disturbance.   Respiratory: Positive for cough (see hpi) and shortness of breath (see hpi). Negative for chest tightness and wheezing.   Cardiovascular: Positive for chest pain (see hpi). Negative for palpitations and leg swelling.  Gastrointestinal: Positive for nausea, abdominal pain, diarrhea and constipation. Negative for vomiting, blood in stool and abdominal distention.  Genitourinary: Negative for hematuria and difficulty urinating.  Musculoskeletal: Negative for myalgias, arthralgias and neck pain.  Skin: Negative for rash.  Neurological: Negative for dizziness, seizures, syncope and headaches.  Hematological: Negative for adenopathy. Does not bruise/bleed easily.  Psychiatric/Behavioral: Negative for dysphoric mood. The patient is not nervous/anxious.    Per HPI unless specifically indicated in ROS section     Objective:    BP 134/68 mmHg  Pulse 67  Temp(Src) 98.1 F (36.7 C) (Oral)  Ht 5' 3.5" (1.613 m)  Wt 143 lb 8 oz (65.091 kg)  BMI 25.02 kg/m2  SpO2 95%  Wt Readings from Last 3 Encounters:  04/15/16 143 lb 8 oz (65.091 kg)  04/13/16 142 lb (64.411 kg)  04/04/16 140 lb (63.504 kg)    Physical Exam  Constitutional: She is oriented to person, place, and time. She appears well-developed and well-nourished. No distress.  HENT:  Head: Normocephalic and atraumatic.  Right Ear: Hearing, tympanic membrane, external ear and ear canal normal.  Left Ear: Hearing, tympanic membrane, external ear and ear canal normal.  Nose: Nose normal.  Mouth/Throat: Uvula is midline, oropharynx is clear and moist and mucous membranes are normal. No oropharyngeal exudate, posterior oropharyngeal edema or posterior oropharyngeal erythema.  Eyes: Conjunctivae and EOM are normal. Pupils are equal, round, and reactive to light. No scleral icterus.  Neck: Normal range of motion. Neck supple. Carotid bruit is not present. No thyromegaly present.  Cardiovascular: Normal rate, regular rhythm, normal heart sounds  and intact distal pulses.   No murmur heard. Pulses:      Radial pulses are 2+ on the right side, and 2+ on the left side.  Pulmonary/Chest: Effort normal and breath sounds normal. No respiratory distress. She has no wheezes. She has no rales.  Abdominal: Soft. Bowel sounds are normal. She exhibits no distension and no mass. There is no tenderness. There is no rebound and no guarding.  Musculoskeletal: Normal range of motion. She exhibits no edema.  Lymphadenopathy:    She has no cervical adenopathy.  Neurological: She is alert and oriented to person, place, and time.  CN grossly intact, station and gait intact  Skin: Skin is warm and dry. No rash noted.  Psychiatric: She has a normal mood and affect. Her behavior is normal. Judgment and thought content normal.  Nursing note and vitals reviewed.  Results for orders placed or performed in visit on 04/13/16  Lipid panel  Result Value Ref Range   Cholesterol 184 0 - 200 mg/dL   Triglycerides 255.0 (H) 0.0 - 149.0 mg/dL   HDL 47.30 >39.00 mg/dL   VLDL 51.0 (H) 0.0 - 40.0 mg/dL   Total CHOL/HDL Ratio 4  NonHDL 136.66   Hepatitis C antibody  Result Value Ref Range   HCV Ab NEGATIVE NEGATIVE  LDL cholesterol, direct  Result Value Ref Range   Direct LDL 93.0 mg/dL   Lab Results  Component Value Date   CKTOTAL 65 11/21/2011   CKMB 0.6 11/21/2011   TROPONINI 0.06* 04/04/2016       Assessment & Fry:   Problem List Items Addressed This Visit    HLD (hyperlipidemia)    Chronic, stable. Continue lipitor 20mg  daily. Discussed healthy diet changes to improve trig levels. Recommended mediterranean diet.      Essential hypertension    Chronic, stable. Continue current regimen.       Recurrent UTI    Followed by urologist.  Reaction to macrobid. Continue trimethoprim 100mg  daily.       Advanced care planning/counseling discussion    Advanced directive discussion - Has not set up. Would want husband to be HCPOA. Packet provided        Health maintenance examination - Primary    Preventative protocols reviewed and updated unless pt declined. Discussed healthy diet and lifestyle.       CAD (coronary artery disease)    Reviewed and discussed recent workup by Bianca Fry. Nonobstructive CAD rec medical management. Questions answered.  Recommended mediterranean diet. LDL goal <100.           Follow up Fry: Return in about 1 year (around 04/15/2017), or as needed, for annual exam, prior fasting for blood work.  Ria Bush, MD

## 2016-04-15 NOTE — Patient Instructions (Addendum)
pneumovax today. Increase fatty fish in diet, work on [employer]. Continue atorvastatin and other meds.  Return as needed or in 1 year for next physical. You are doing well today, call us with questions. Work on [employer].       Mediterranean Diet  Why follow it? Research shows. . Those who follow the Mediterranean diet have a reduced risk of heart disease  . The diet is associated with a reduced incidence of Parkinson's and Alzheimer's diseases . People following the diet may have longer life expectancies and lower rates of chronic diseases  . The Dietary Guidelines for Americans recommends the Mediterranean diet as an eating plan to promote health and prevent disease  What Is the Mediterranean Diet?  . Healthy eating plan based on typical foods and recipes of Mediterranean-style cooking . The diet is primarily a plant based diet; these foods should make up a majority of meals   Starches - Plant based foods should make up a majority of meals - They are an important sources of vitamins, minerals, energy, antioxidants, and fiber - Choose whole grains, foods high in fiber and minimally processed items  - Typical grain sources include wheat, oats, barley, corn, brown rice, bulgar, farro, millet, polenta, couscous  - Various types of beans include chickpeas, lentils, fava beans, black beans, white beans   Fruits  Veggies - Large quantities of antioxidant rich fruits & veggies; 6 or more servings  - Vegetables can be eaten raw or lightly drizzled with oil and cooked  - Vegetables common to the traditional Mediterranean Diet include: artichokes, arugula, beets, broccoli, brussel sprouts, cabbage, carrots, celery, collard greens, cucumbers, eggplant, kale, leeks, lemons, lettuce, mushrooms, okra, onions, peas, peppers, potatoes, pumpkin, radishes, rutabaga, shallots, spinach, sweet potatoes, turnips, zucchini - Fruits common to the Mediterranean Diet include: apples,  apricots, avocados, cherries, clementines, dates, figs, grapefruits, grapes, melons, nectarines, oranges, peaches, pears, pomegranates, strawberries, tangerines  Fats - Replace butter and margarine with healthy oils, such as olive oil, canola oil, and tahini  - Limit nuts to no more than a handful a day  - Nuts include walnuts, almonds, pecans, pistachios, pine nuts  - Limit or avoid candied, honey roasted or heavily salted nuts - Olives are central to the [employer] - can be eaten whole or used in a variety of dishes   Meats Protein - Limiting red meat: no more than a few times a month - When eating red meat: choose lean cuts and keep the portion to the size of deck of cards - Eggs: approx. 0 to 4 times a week  - Fish and lean poultry: at least 2 a week  - Healthy protein sources include, chicken, [place], lean beef, lamb - Increase intake of seafood such as tuna, salmon, trout, mackerel, shrimp, scallops - Avoid or limit high fat processed meats such as sausage and bacon  Dairy - Include moderate amounts of low fat dairy products  - Focus on healthy dairy such as fat free yogurt, skim milk, low or reduced fat cheese - Limit dairy products higher in fat such as whole or 2% milk, cheese, ice cream  Alcohol - Moderate amounts of red wine is ok  - No more than 5 oz daily for women (all ages) and men older than age 36  - No more than 10 oz of wine daily for men younger than 23  Other - Limit sweets and other desserts  - Use herbs and spices instead of salt to  flavor foods  - Herbs and spices common to the traditional Mediterranean Diet include: basil, bay leaves, chives, cloves, cumin, fennel, garlic, lavender, marjoram, mint, oregano, parsley, pepper, rosemary, sage, savory, sumac, tarragon, thyme   It's not just a diet, it's a lifestyle:  . The Mediterranean diet includes lifestyle factors typical of those in the region  . Foods, drinks and meals are best eaten with others and  savored . Daily physical activity is important for overall good health . This could be strenuous exercise like running and aerobics . This could also be more leisurely activities such as walking, housework, yard-work, or taking the stairs . Moderation is the key; a balanced and healthy diet accommodates most foods and drinks . Consider portion sizes and frequency of consumption of certain foods   Meal Ideas & Options:  . Breakfast:  o Whole wheat toast or whole wheat English muffins with peanut butter & hard boiled egg o Steel cut oats topped with apples & cinnamon and skim milk  o Fresh fruit: banana, strawberries, melon, berries, peaches  o Smoothies: strawberries, bananas, greek yogurt, peanut butter o Low fat greek yogurt with blueberries and granola  o Egg white omelet with spinach and mushrooms o Breakfast couscous: whole wheat couscous, apricots, skim milk, cranberries  . Sandwiches:  o Hummus and grilled vegetables (peppers, zucchini, squash) on whole wheat bread   o Grilled chicken on whole wheat pita with lettuce, tomatoes, cucumbers or tzatziki  o Tuna salad on whole wheat bread: tuna salad made with greek yogurt, olives, red peppers, capers, green onions o Garlic rosemary lamb pita: lamb sauted with garlic, rosemary, salt & pepper; add lettuce, cucumber, greek yogurt to pita - flavor with lemon juice and black pepper  . Seafood:  o Mediterranean grilled salmon, seasoned with garlic, basil, parsley, lemon juice and black pepper o Shrimp, lemon, and spinach whole-grain pasta salad made with low fat greek yogurt  o Seared scallops with lemon orzo  o Seared tuna steaks seasoned salt, pepper, coriander topped with tomato mixture of olives, tomatoes, olive oil, minced garlic, parsley, green onions and cappers  . Meats:  o Herbed greek chicken salad with kalamata olives, cucumber, feta  o Red bell peppers stuffed with spinach, bulgur, lean ground beef (or lentils) & topped with feta    o Kebabs: skewers of chicken, tomatoes, onions, zucchini, squash  o [place] burgers: made with red onions, mint, dill, lemon juice, feta cheese topped with roasted red peppers . Vegetarian o Cucumber salad: cucumbers, artichoke hearts, celery, red onion, feta cheese, tossed in olive oil & lemon juice  o Hummus and whole grain pita points with a greek salad (lettuce, tomato, feta, olives, cucumbers, red onion) o Lentil soup with celery, carrots made with vegetable broth, garlic, salt and pepper  o Tabouli salad: parsley, bulgur, mint, scallions, cucumbers, tomato, radishes, lemon juice, olive oil, salt and pepper.

## 2016-04-15 NOTE — Assessment & Plan Note (Signed)
Chronic, stable. Continue lipitor 20mg  daily. Discussed healthy diet changes to improve trig levels. Recommended mediterranean diet.

## 2016-04-15 NOTE — Assessment & Plan Note (Signed)
Followed by urologist.  Reaction to macrobid. Continue trimethoprim 100mg  daily.

## 2016-04-29 DIAGNOSIS — R3982 Chronic bladder pain: Secondary | ICD-10-CM | POA: Diagnosis not present

## 2016-04-29 DIAGNOSIS — R8271 Bacteriuria: Secondary | ICD-10-CM | POA: Diagnosis not present

## 2016-04-29 DIAGNOSIS — N3020 Other chronic cystitis without hematuria: Secondary | ICD-10-CM | POA: Diagnosis not present

## 2016-05-04 ENCOUNTER — Other Ambulatory Visit: Payer: Self-pay | Admitting: Family Medicine

## 2016-05-20 DIAGNOSIS — N3020 Other chronic cystitis without hematuria: Secondary | ICD-10-CM | POA: Diagnosis not present

## 2016-05-27 DIAGNOSIS — M62838 Other muscle spasm: Secondary | ICD-10-CM | POA: Diagnosis not present

## 2016-05-27 DIAGNOSIS — M6281 Muscle weakness (generalized): Secondary | ICD-10-CM | POA: Diagnosis not present

## 2016-05-27 DIAGNOSIS — R152 Fecal urgency: Secondary | ICD-10-CM | POA: Diagnosis not present

## 2016-05-27 DIAGNOSIS — N941 Unspecified dyspareunia: Secondary | ICD-10-CM | POA: Diagnosis not present

## 2016-05-27 DIAGNOSIS — R278 Other lack of coordination: Secondary | ICD-10-CM | POA: Diagnosis not present

## 2016-06-03 DIAGNOSIS — M62838 Other muscle spasm: Secondary | ICD-10-CM | POA: Diagnosis not present

## 2016-06-03 DIAGNOSIS — N3946 Mixed incontinence: Secondary | ICD-10-CM | POA: Diagnosis not present

## 2016-06-03 DIAGNOSIS — R278 Other lack of coordination: Secondary | ICD-10-CM | POA: Diagnosis not present

## 2016-06-03 DIAGNOSIS — M6281 Muscle weakness (generalized): Secondary | ICD-10-CM | POA: Diagnosis not present

## 2016-06-03 DIAGNOSIS — R152 Fecal urgency: Secondary | ICD-10-CM | POA: Diagnosis not present

## 2016-06-03 DIAGNOSIS — R1032 Left lower quadrant pain: Secondary | ICD-10-CM | POA: Diagnosis not present

## 2016-06-07 ENCOUNTER — Other Ambulatory Visit: Payer: Self-pay | Admitting: Family Medicine

## 2016-06-09 ENCOUNTER — Ambulatory Visit (INDEPENDENT_AMBULATORY_CARE_PROVIDER_SITE_OTHER): Payer: PPO | Admitting: Family Medicine

## 2016-06-09 VITALS — BP 120/64 | HR 72 | Temp 98.2°F | Wt 140.0 lb

## 2016-06-09 DIAGNOSIS — M6281 Muscle weakness (generalized): Secondary | ICD-10-CM | POA: Diagnosis not present

## 2016-06-09 DIAGNOSIS — R300 Dysuria: Secondary | ICD-10-CM

## 2016-06-09 DIAGNOSIS — M62838 Other muscle spasm: Secondary | ICD-10-CM | POA: Diagnosis not present

## 2016-06-09 DIAGNOSIS — N3946 Mixed incontinence: Secondary | ICD-10-CM | POA: Diagnosis not present

## 2016-06-09 DIAGNOSIS — R102 Pelvic and perineal pain: Secondary | ICD-10-CM

## 2016-06-09 DIAGNOSIS — N941 Unspecified dyspareunia: Secondary | ICD-10-CM | POA: Diagnosis not present

## 2016-06-09 DIAGNOSIS — R278 Other lack of coordination: Secondary | ICD-10-CM | POA: Diagnosis not present

## 2016-06-09 DIAGNOSIS — R152 Fecal urgency: Secondary | ICD-10-CM | POA: Diagnosis not present

## 2016-06-09 LAB — POC URINALSYSI DIPSTICK (AUTOMATED)
Bilirubin, UA: NEGATIVE
GLUCOSE UA: NEGATIVE
KETONES UA: NEGATIVE
Nitrite, UA: NEGATIVE
UROBILINOGEN UA: NEGATIVE
pH, UA: 6

## 2016-06-09 NOTE — Progress Notes (Signed)
Pre visit review using our clinic review tool, if applicable. No additional management support is needed unless otherwise documented below in the visit note. 

## 2016-06-09 NOTE — Assessment & Plan Note (Signed)
Recurrent dysuria - now followed by urology. Check UA to r/o UTI - suspicious for infection so will send UCx. Await results prior to starting abx.   Discussed possibility of interstitial cystitis as ibuprofen is helpful.

## 2016-06-09 NOTE — Patient Instructions (Signed)
We will await urine culture and will be in touch with results.  Continue other treatment as up to now.  Possible interstitial cystitis if ongoing symptoms after treatment.

## 2016-06-09 NOTE — Progress Notes (Signed)
BP 120/64   Pulse 72   Temp 98.2 F (36.8 C)   Wt 140 lb (63.5 kg)   SpO2 97%   BMI 24.41 kg/m    CC: pelvic pain Subjective:    Patient ID: Bianca Fry, female    DOB: August 26, 1946, 70 y.o.   MRN: [id number]  HPI: Bianca Fry is a 70 y.o. female presenting on 06/09/2016 for Pelvic Pain (had a UTI since may, pain when urinating. pt seen uro on 8/02 has a follow up on november. )   Ongoing UTI symptoms since 02/2016 - painful voiding and vaginal soreness with prolonged walking. Macrobid was started by Dr Louis Meckel urologist for treatment of UTI. Monurol was too expensive - but ended up filling. She regularly takes trimethoprim for daily prevention. Also on cranberry tablets and probiotic. Tried vaginal valium.   Latest urology note reviewed - difficulty separating infectious symptoms from chronic pelvic pain. rec avoid abx unless positive cultures. Referred to PT - unsure if it's helping. She is increasing fluid intake as well as increased cranberry tablets as well as probiotic and stool softeners. Now off trimethoprim. F/u apt 08/2016 with urology.   No fevers/chills, nausea, bowel changes, diarrhea or blood in stool. No hematuria.   Sling was evaluated and looks fine.  Ibuprofen has helped in the past.   Relevant past medical, surgical, family and social history reviewed and updated as indicated. Interim medical history since our last visit reviewed. Allergies and medications reviewed and updated. Current Outpatient Prescriptions on File Prior to Visit  Medication Sig  . aspirin EC 81 MG EC tablet Take 1 tablet (81 mg total) by mouth daily.  Marland Kitchen atenolol (TENORMIN) 25 MG tablet TAKE ONE TABLET BY MOUTH ONCE DAILY  . atorvastatin (LIPITOR) 20 MG tablet Take 1 tablet (20 mg total) by mouth 2 (two) times a week. On Monday and Friday  . Calcium Carb-Cholecalciferol (CALCIUM-VITAMIN D) 600-400 MG-UNIT TABS Take 1 tablet by mouth daily.  . clonazePAM (KLONOPIN) 0.5 MG tablet TAKE ONE-HALF  TO ONE TABLET BY MOUTH TWICE DAILY AS NEEDED  . CRANBERRY PO Take 3 tablets by mouth 2 (two) times daily.   Marland Kitchen dicyclomine (BENTYL) 10 MG capsule Take 1 capsule (10 mg total) by mouth 2 (two) times daily as needed for spasms.  Marland Kitchen estradiol (ESTRACE) 0.1 MG/GM vaginal cream Use 1/2 g vaginally every night for the first 2 weeks, then use 1/2 g vaginally two to three times a week.  . fexofenadine (ALLEGRA) 180 MG tablet Take 180 mg by mouth daily as needed for allergies.   Marland Kitchen omeprazole (PRILOSEC) 40 MG capsule TAKE ONE CAPSULE BY MOUTH ONCE DAILY  . ondansetron (ZOFRAN) 8 MG tablet TAKE ONE TABLET BY MOUTH EVERY 8 HOURS AS NEEDED FOR NAUSEA AND VOMITING  . Probiotic Product (PROBIOTIC DAILY PO) Take 1 tablet by mouth daily. Nature's way   No current facility-administered medications on file prior to visit.     Review of Systems Per HPI unless specifically indicated in ROS section     Objective:    BP 120/64   Pulse 72   Temp 98.2 F (36.8 C)   Wt 140 lb (63.5 kg)   SpO2 97%   BMI 24.41 kg/m   Wt Readings from Last 3 Encounters:  06/09/16 140 lb (63.5 kg)  04/15/16 143 lb 8 oz (65.1 kg)  04/13/16 142 lb (64.4 kg)    Physical Exam  Constitutional: She appears well-developed and well-nourished. No distress.  HENT:  Mouth/Throat: Oropharynx is clear and moist. No oropharyngeal exudate.  Abdominal: Soft. Normal appearance and bowel sounds are normal. She exhibits no distension and no mass. There is no hepatosplenomegaly. There is tenderness (mild) in the suprapubic area. There is no rigidity, no rebound, no guarding and no CVA tenderness.  Musculoskeletal: Normal range of motion. She exhibits no edema.  Skin: Skin is warm and dry. No rash noted.  Psychiatric: She has a normal mood and affect.  Nursing note and vitals reviewed.  Results for orders placed or performed in visit on 06/09/16  POCT Urinalysis Dipstick (Automated)  Result Value Ref Range   Color, UA yellow    Clarity, UA  cloody    Glucose, UA neg    Bilirubin, UA neg    Ketones, UA neg    Spec Grav, UA >=1.030    Blood, UA 1+    pH, UA 6.0    Protein, UA 1+    Urobilinogen, UA negative    Nitrite, UA neg    Leukocytes, UA large (3+) (A) Negative      Assessment & Plan:  Over 25 minutes were spent face-to-face with the patient during this encounter and >50% of that time was spent on counseling and coordination of care  Problem List Items Addressed This Visit    Dysuria - Primary    Recurrent dysuria - now followed by urology. Check UA to r/o UTI - suspicious for infection so will send UCx. Await results prior to starting abx.   Discussed possibility of interstitial cystitis as ibuprofen is helpful.       Relevant Orders   Urine culture    Other Visit Diagnoses    Pelvic pain in female       Relevant Orders   POCT Urinalysis Dipstick (Automated) (Completed)       Follow up plan: Return if symptoms worsen or fail to improve.  Ria Bush, MD

## 2016-06-11 LAB — URINE CULTURE

## 2016-06-12 DIAGNOSIS — L814 Other melanin hyperpigmentation: Secondary | ICD-10-CM | POA: Diagnosis not present

## 2016-06-12 DIAGNOSIS — L570 Actinic keratosis: Secondary | ICD-10-CM | POA: Diagnosis not present

## 2016-06-12 DIAGNOSIS — D0462 Carcinoma in situ of skin of left upper limb, including shoulder: Secondary | ICD-10-CM | POA: Diagnosis not present

## 2016-06-12 DIAGNOSIS — Z85828 Personal history of other malignant neoplasm of skin: Secondary | ICD-10-CM | POA: Diagnosis not present

## 2016-06-12 DIAGNOSIS — L821 Other seborrheic keratosis: Secondary | ICD-10-CM | POA: Diagnosis not present

## 2016-06-13 ENCOUNTER — Other Ambulatory Visit: Payer: Self-pay | Admitting: Family Medicine

## 2016-06-13 MED ORDER — CEPHALEXIN 500 MG PO CAPS: 500.0000 mg | ORAL_CAPSULE | Freq: Two times a day (BID) | ORAL | 0 refills | Status: DC

## 2016-06-16 DIAGNOSIS — M6281 Muscle weakness (generalized): Secondary | ICD-10-CM | POA: Diagnosis not present

## 2016-06-16 DIAGNOSIS — M62838 Other muscle spasm: Secondary | ICD-10-CM | POA: Diagnosis not present

## 2016-06-16 DIAGNOSIS — R278 Other lack of coordination: Secondary | ICD-10-CM | POA: Diagnosis not present

## 2016-06-16 DIAGNOSIS — N3946 Mixed incontinence: Secondary | ICD-10-CM | POA: Diagnosis not present

## 2016-06-24 DIAGNOSIS — Z85828 Personal history of other malignant neoplasm of skin: Secondary | ICD-10-CM | POA: Diagnosis not present

## 2016-06-24 DIAGNOSIS — D099 Carcinoma in situ, unspecified: Secondary | ICD-10-CM | POA: Insufficient documentation

## 2016-06-24 DIAGNOSIS — D0462 Carcinoma in situ of skin of left upper limb, including shoulder: Secondary | ICD-10-CM | POA: Diagnosis not present

## 2016-06-29 ENCOUNTER — Other Ambulatory Visit: Payer: Self-pay | Admitting: Gastroenterology

## 2016-06-29 ENCOUNTER — Other Ambulatory Visit: Payer: Self-pay | Admitting: Family Medicine

## 2016-06-29 NOTE — Telephone Encounter (Signed)
Ok to refill? Last filled 03/16/16 #40 0RF

## 2016-06-29 NOTE — Telephone Encounter (Signed)
plz phone in. 

## 2016-06-30 NOTE — Telephone Encounter (Signed)
Rx called in as directed.   

## 2016-07-03 ENCOUNTER — Telehealth: Payer: Self-pay | Admitting: Gastroenterology

## 2016-07-03 MED ORDER — ONDANSETRON HCL 8 MG PO TABS: ORAL_TABLET | ORAL | 1 refills | Status: DC

## 2016-07-03 NOTE — Telephone Encounter (Signed)
Patient states she ongoing issues with diarrhea IBS. Patient states she likes having Zofran on hand when she has a flare up of nausea and abd pain with her diarrhea. I informed patient we have not refilled in over a year and I will just need to ask Dr. Fuller Plan if we can refill. Please advise.

## 2016-07-03 NOTE — Telephone Encounter (Signed)
Prescription sent to patient's pharmacy.

## 2016-07-03 NOTE — Telephone Encounter (Signed)
OK to refill Zofran prn for a few months

## 2016-07-10 DIAGNOSIS — R3982 Chronic bladder pain: Secondary | ICD-10-CM | POA: Diagnosis not present

## 2016-07-10 DIAGNOSIS — R278 Other lack of coordination: Secondary | ICD-10-CM | POA: Diagnosis not present

## 2016-07-10 DIAGNOSIS — M62838 Other muscle spasm: Secondary | ICD-10-CM | POA: Diagnosis not present

## 2016-07-10 DIAGNOSIS — M6281 Muscle weakness (generalized): Secondary | ICD-10-CM | POA: Diagnosis not present

## 2016-07-10 DIAGNOSIS — N3946 Mixed incontinence: Secondary | ICD-10-CM | POA: Diagnosis not present

## 2016-07-10 DIAGNOSIS — R102 Pelvic and perineal pain: Secondary | ICD-10-CM | POA: Diagnosis not present

## 2016-07-14 DIAGNOSIS — R070 Pain in throat: Secondary | ICD-10-CM | POA: Diagnosis not present

## 2016-07-14 DIAGNOSIS — H6123 Impacted cerumen, bilateral: Secondary | ICD-10-CM | POA: Diagnosis not present

## 2016-07-14 DIAGNOSIS — H9201 Otalgia, right ear: Secondary | ICD-10-CM | POA: Diagnosis not present

## 2016-07-20 DIAGNOSIS — N3946 Mixed incontinence: Secondary | ICD-10-CM | POA: Diagnosis not present

## 2016-07-20 DIAGNOSIS — R278 Other lack of coordination: Secondary | ICD-10-CM | POA: Diagnosis not present

## 2016-07-20 DIAGNOSIS — M6281 Muscle weakness (generalized): Secondary | ICD-10-CM | POA: Diagnosis not present

## 2016-07-20 DIAGNOSIS — R102 Pelvic and perineal pain: Secondary | ICD-10-CM | POA: Diagnosis not present

## 2016-07-20 DIAGNOSIS — M62838 Other muscle spasm: Secondary | ICD-10-CM | POA: Diagnosis not present

## 2016-07-22 ENCOUNTER — Other Ambulatory Visit: Payer: Self-pay | Admitting: Family Medicine

## 2016-07-22 DIAGNOSIS — R928 Other abnormal and inconclusive findings on diagnostic imaging of breast: Secondary | ICD-10-CM

## 2016-07-27 ENCOUNTER — Ambulatory Visit (INDEPENDENT_AMBULATORY_CARE_PROVIDER_SITE_OTHER): Payer: PPO

## 2016-07-27 DIAGNOSIS — Z23 Encounter for immunization: Secondary | ICD-10-CM | POA: Diagnosis not present

## 2016-07-28 ENCOUNTER — Ambulatory Visit: Payer: PPO

## 2016-07-31 ENCOUNTER — Ambulatory Visit (INDEPENDENT_AMBULATORY_CARE_PROVIDER_SITE_OTHER): Payer: PPO | Admitting: Family Medicine

## 2016-07-31 ENCOUNTER — Encounter: Payer: Self-pay | Admitting: Family Medicine

## 2016-07-31 VITALS — BP 118/78 | HR 70 | Temp 98.3°F | Wt 138.2 lb

## 2016-07-31 DIAGNOSIS — B9789 Other viral agents as the cause of diseases classified elsewhere: Secondary | ICD-10-CM | POA: Diagnosis not present

## 2016-07-31 DIAGNOSIS — J069 Acute upper respiratory infection, unspecified: Secondary | ICD-10-CM

## 2016-07-31 NOTE — Progress Notes (Signed)
BP 118/78   Pulse 70   Temp 98.3 F (36.8 C) (Oral)   Wt 138 lb 4 oz (62.7 kg)   SpO2 97%   BMI 24.11 kg/m    CC: URI Subjective:    Patient ID: Bianca Fry, female    DOB: 07-30-1946, 70 y.o.   MRN: [id number]  HPI: Bianca Fry is a 70 y.o. female presenting on 07/31/2016 for URI (congestion, cough )   Monday received flu shot. Next day started developing R sided ST, rhinorrhea, cough, R earache, PNdrainage. Headache present.   No fevers/chills, tooth pain, body aches.   So far has tried tylenol and allergy medication which mildly helped  No sick contacts at home. No smokers at home. No h/o asthma.   Relevant past medical, surgical, family and social history reviewed and updated as indicated. Interim medical history since our last visit reviewed. Allergies and medications reviewed and updated. Current Outpatient Prescriptions on File Prior to Visit  Medication Sig  . aspirin EC 81 MG EC tablet Take 1 tablet (81 mg total) by mouth daily.  Marland Kitchen atenolol (TENORMIN) 25 MG tablet TAKE ONE TABLET BY MOUTH ONCE DAILY  . atorvastatin (LIPITOR) 20 MG tablet Take 1 tablet (20 mg total) by mouth 2 (two) times a week. On Monday and Friday  . Calcium Carb-Cholecalciferol (CALCIUM-VITAMIN D) 600-400 MG-UNIT TABS Take 1 tablet by mouth daily.  . clonazePAM (KLONOPIN) 0.5 MG tablet TAKE ONE-HALF TO ONE TABLET BY MOUTH TWICE DAILY AS NEEDED  . CRANBERRY PO Take 3 tablets by mouth 2 (two) times daily.   Marland Kitchen dicyclomine (BENTYL) 10 MG capsule Take 1 capsule (10 mg total) by mouth 2 (two) times daily as needed for spasms.  Marland Kitchen estradiol (ESTRACE) 0.1 MG/GM vaginal cream Use 1/2 g vaginally every night for the first 2 weeks, then use 1/2 g vaginally two to three times a week.  . fexofenadine (ALLEGRA) 180 MG tablet Take 180 mg by mouth daily as needed for allergies.   Marland Kitchen omeprazole (PRILOSEC) 40 MG capsule TAKE ONE CAPSULE BY MOUTH ONCE DAILY  . ondansetron (ZOFRAN) 8 MG tablet TAKE ONE TABLET BY  MOUTH EVERY 8 HOURS AS NEEDED FOR NAUSEA AND VOMITING  . Probiotic Product (PROBIOTIC DAILY PO) Take 1 tablet by mouth daily. Nature's way   No current facility-administered medications on file prior to visit.     Review of Systems Per HPI unless specifically indicated in ROS section     Objective:    BP 118/78   Pulse 70   Temp 98.3 F (36.8 C) (Oral)   Wt 138 lb 4 oz (62.7 kg)   SpO2 97%   BMI 24.11 kg/m   Wt Readings from Last 3 Encounters:  07/31/16 138 lb 4 oz (62.7 kg)  06/09/16 140 lb (63.5 kg)  04/15/16 143 lb 8 oz (65.1 kg)    Physical Exam  Constitutional: She appears well-developed and well-nourished. No distress.  HENT:  Head: Normocephalic and atraumatic.  Right Ear: Hearing, tympanic membrane, external ear and ear canal normal.  Left Ear: Hearing, tympanic membrane, external ear and ear canal normal.  Nose: Mucosal edema (R>L nasal mucosal irritation  with congestion) and rhinorrhea present. Right sinus exhibits no maxillary sinus tenderness and no frontal sinus tenderness. Left sinus exhibits no maxillary sinus tenderness and no frontal sinus tenderness.  Mouth/Throat: Uvula is midline and mucous membranes are normal. Posterior oropharyngeal edema and posterior oropharyngeal erythema present. No oropharyngeal exudate or tonsillar abscesses.  Eyes:  Conjunctivae and EOM are normal. Pupils are equal, round, and reactive to light. No scleral icterus.  Neck: Normal range of motion. Neck supple.  Cardiovascular: Normal rate, regular rhythm, normal heart sounds and intact distal pulses.   No murmur heard. Pulmonary/Chest: Effort normal and breath sounds normal. No respiratory distress. She has no wheezes. She has no rales.  Lymphadenopathy:    She has no cervical adenopathy.  Skin: Skin is warm and dry. No rash noted.  Nursing note and vitals reviewed.     Assessment & Plan:   Problem List Items Addressed This Visit    Viral URI with cough - Primary     Anticipate either viral URI or immune response to flu vaccine. Reviewed supportive care measures. reviewed red flags to seek further care. Pt agrees with plan. No signs of bacterial infection today.       Other Visit Diagnoses   None.      Follow up plan: Return if symptoms worsen or fail to improve.  Ria Bush, MD

## 2016-07-31 NOTE — Assessment & Plan Note (Signed)
Anticipate either viral URI or immune response to flu vaccine. Reviewed supportive care measures. reviewed red flags to seek further care. Pt agrees with plan. No signs of bacterial infection today.

## 2016-07-31 NOTE — Patient Instructions (Signed)
This is either viral or body mounting an immune response to flu shot.  Continue supportive care - push fluids and rest, may use ibuprofen or tylenol, herbal tea to soothe throat or popsicle.  Let us know if fever >101 or worsening productive cough.

## 2016-07-31 NOTE — Progress Notes (Signed)
Pre visit review using our clinic review tool, if applicable. No additional management support is needed unless otherwise documented below in the visit note. 

## 2016-08-10 ENCOUNTER — Encounter: Payer: Self-pay | Admitting: Family Medicine

## 2016-08-11 ENCOUNTER — Ambulatory Visit
Admission: RE | Admit: 2016-08-11 | Discharge: 2016-08-11 | Disposition: A | Discharge status: Home / Self Care | Payer: PPO | Source: Ambulatory Visit | Attending: Family Medicine | Admitting: Family Medicine

## 2016-08-11 ENCOUNTER — Encounter: Payer: Self-pay | Admitting: *Deleted

## 2016-08-11 DIAGNOSIS — R928 Other abnormal and inconclusive findings on diagnostic imaging of breast: Secondary | ICD-10-CM | POA: Diagnosis not present

## 2016-08-11 LAB — HM MAMMOGRAPHY

## 2016-08-11 MED ORDER — RANITIDINE HCL 150 MG PO TABS: 150.0000 mg | ORAL_TABLET | Freq: Every day | ORAL | Status: DC

## 2016-08-11 NOTE — Addendum Note (Signed)
Addended by: Ria Bush on: 08/11/2016 10:09 AM   Modules accepted: Orders

## 2016-08-19 DIAGNOSIS — H02051 Trichiasis without entropian right upper eyelid: Secondary | ICD-10-CM | POA: Diagnosis not present

## 2016-08-19 DIAGNOSIS — H2512 Age-related nuclear cataract, left eye: Secondary | ICD-10-CM | POA: Diagnosis not present

## 2016-08-24 DIAGNOSIS — N3020 Other chronic cystitis without hematuria: Secondary | ICD-10-CM | POA: Diagnosis not present

## 2016-08-25 ENCOUNTER — Other Ambulatory Visit: Payer: Self-pay | Admitting: Family Medicine

## 2016-08-25 NOTE — Telephone Encounter (Signed)
Ok to refill? Last filled 06/29/16 #40 0RF

## 2016-08-26 NOTE — Telephone Encounter (Signed)
plz phone in. 

## 2016-08-26 NOTE — Telephone Encounter (Signed)
Rx called in as directed.   

## 2016-08-30 ENCOUNTER — Encounter: Payer: Self-pay | Admitting: Family Medicine

## 2016-08-30 DIAGNOSIS — N3946 Mixed incontinence: Secondary | ICD-10-CM | POA: Insufficient documentation

## 2016-10-04 ENCOUNTER — Other Ambulatory Visit: Payer: Self-pay | Admitting: Family Medicine

## 2016-10-05 NOTE — Telephone Encounter (Signed)
Phoned in to [place], [place] - [place]: [phone]

## 2016-10-05 NOTE — Telephone Encounter (Signed)
plz phone in. 

## 2016-10-21 DIAGNOSIS — L821 Other seborrheic keratosis: Secondary | ICD-10-CM | POA: Diagnosis not present

## 2016-10-21 DIAGNOSIS — L570 Actinic keratosis: Secondary | ICD-10-CM | POA: Diagnosis not present

## 2016-10-21 DIAGNOSIS — L814 Other melanin hyperpigmentation: Secondary | ICD-10-CM | POA: Diagnosis not present

## 2016-10-21 DIAGNOSIS — Z85828 Personal history of other malignant neoplasm of skin: Secondary | ICD-10-CM | POA: Diagnosis not present

## 2016-12-27 ENCOUNTER — Other Ambulatory Visit: Payer: Self-pay | Admitting: Family Medicine

## 2016-12-28 NOTE — Telephone Encounter (Signed)
Ok to refill? Last filled 10/02/16 #40 0RF

## 2016-12-29 ENCOUNTER — Other Ambulatory Visit: Payer: Self-pay | Admitting: Family Medicine

## 2016-12-29 NOTE — Telephone Encounter (Signed)
plz phone in. 

## 2016-12-29 NOTE — Telephone Encounter (Signed)
Rx called in as directed.   

## 2017-01-11 ENCOUNTER — Encounter: Payer: Self-pay | Admitting: Family Medicine

## 2017-01-11 ENCOUNTER — Ambulatory Visit (INDEPENDENT_AMBULATORY_CARE_PROVIDER_SITE_OTHER): Payer: PPO | Admitting: Family Medicine

## 2017-01-11 VITALS — BP 128/62 | HR 72 | Temp 97.7°F | Wt 139.5 lb

## 2017-01-11 DIAGNOSIS — Q383 Other congenital malformations of tongue: Secondary | ICD-10-CM

## 2017-01-11 NOTE — Assessment & Plan Note (Signed)
Endorsed tongue fuzzy feeling however normal exam. Anticipate bumps she felt were normal vallate papillae.  Suggested increased water intake, brushing tongue daily, and listerine PRN.  Update if not improved. Pt agrees with plan .

## 2017-01-11 NOTE — Patient Instructions (Signed)
I think spots on back of tongue are normal vallate papillae.  Continue drinking plenty of water, brushing teeth daily along with listerine mouthwash.  Let us know if worsening symptoms.

## 2017-01-11 NOTE — Progress Notes (Signed)
BP 128/62   Pulse 72   Temp 97.7 F (36.5 C) (Oral)   Wt 139 lb 8 oz (63.3 kg)   BMI 24.32 kg/m    CC: check tongue Subjective:    Patient ID: Bianca Fry, female    DOB: 04/12/1946, 71 y.o.   MRN: [id number]  HPI: Bianca Fry is a 71 y.o. female presenting on 01/11/2017 for Check tongue (feels bumpy)   Tongue feels fuzzy over the past 1-2 weeks. Not painful or bothersome necessarily. Mild ST in am when she awakens as well as some PNdrainage from allergies. Some dry mouth in am as well. Last weekend she did eat salsa with jalapeos at [employer].  H/o this in the past.  No recent abx use.  No fevers/chills, rash, canker sores or other oral lesions.   GERD - controlled with omeprazole and zantac.   Relevant past medical, surgical, family and social history reviewed and updated as indicated. Interim medical history since our last visit reviewed. Allergies and medications reviewed and updated. Outpatient Medications Prior to Visit  Medication Sig Dispense Refill  . aspirin EC 81 MG EC tablet Take 1 tablet (81 mg total) by mouth daily. 30 tablet 2  . atenolol (TENORMIN) 25 MG tablet TAKE ONE TABLET BY MOUTH ONCE DAILY 90 tablet 3  . atorvastatin (LIPITOR) 20 MG tablet Take 1 tablet (20 mg total) by mouth 2 (two) times a week. On Monday and Friday 30 tablet 3  . Calcium Carb-Cholecalciferol (CALCIUM-VITAMIN D) 600-400 MG-UNIT TABS Take 1 tablet by mouth daily.    . clonazePAM (KLONOPIN) 0.5 MG tablet TAKE ONE-HALF TO ONE TABLET BY MOUTH TWICE DAILY AS NEEDED 40 tablet 0  . dicyclomine (BENTYL) 10 MG capsule Take 1 capsule (10 mg total) by mouth 2 (two) times daily as needed for spasms. 30 capsule 3  . estradiol (ESTRACE) 0.1 MG/GM vaginal cream Use 1/2 g vaginally every night for the first 2 weeks, then use 1/2 g vaginally two to three times a week. 42.5 g 2  . fexofenadine (ALLEGRA) 180 MG tablet Take 180 mg by mouth daily as needed for allergies.     Marland Kitchen omeprazole  (PRILOSEC) 40 MG capsule TAKE ONE CAPSULE BY MOUTH ONCE DAILY 90 capsule 3  . ondansetron (ZOFRAN) 8 MG tablet TAKE ONE TABLET BY MOUTH EVERY 8 HOURS AS NEEDED FOR NAUSEA AND VOMITING 90 tablet 1  . ranitidine (ZANTAC) 150 MG tablet Take 1 tablet (150 mg total) by mouth at bedtime.    Marland Kitchen CRANBERRY PO Take 3 tablets by mouth 2 (two) times daily.     . Probiotic Product (PROBIOTIC DAILY PO) Take 1 tablet by mouth daily. Nature's way     No facility-administered medications prior to visit.      Per HPI unless specifically indicated in ROS section below Review of Systems     Objective:    BP 128/62   Pulse 72   Temp 97.7 F (36.5 C) (Oral)   Wt 139 lb 8 oz (63.3 kg)   BMI 24.32 kg/m   Wt Readings from Last 3 Encounters:  01/11/17 139 lb 8 oz (63.3 kg)  07/31/16 138 lb 4 oz (62.7 kg)  06/09/16 140 lb (63.5 kg)    Physical Exam  Constitutional: She appears well-developed and well-nourished. No distress.  HENT:  Head: Normocephalic and atraumatic.  Mouth/Throat: Uvula is midline, oropharynx is clear and moist and mucous membranes are normal. No oropharyngeal exudate, posterior oropharyngeal edema, posterior oropharyngeal  erythema or tonsillar abscesses.  Vallate papillae evident posterior tongue but no significant inflammation or edema of tongue noted today. No white films on tongue.   Eyes: Conjunctivae and EOM are normal. Pupils are equal, round, and reactive to light. No scleral icterus.  Neck: Normal range of motion. Neck supple. No thyromegaly present.  Lymphadenopathy:    She has no cervical adenopathy.  Psychiatric: She has a normal mood and affect.  Nursing note and vitals reviewed.  Results for orders placed or performed in visit on 08/11/16  HM MAMMOGRAPHY  Result Value Ref Range   HM Mammogram 0-4 Bi-Rad 0-4 Bi-Rad, Self Reported Normal      Assessment & Plan:   Problem List Items Addressed This Visit    Tongue distortion - Primary    Endorsed tongue fuzzy feeling  however normal exam. Anticipate bumps she felt were normal vallate papillae.  Suggested increased water intake, brushing tongue daily, and listerine PRN.  Update if not improved. Pt agrees with plan .          Follow up plan: Return if symptoms worsen or fail to improve.  Ria Bush, MD

## 2017-01-11 NOTE — Progress Notes (Signed)
Pre visit review using our clinic review tool, if applicable. No additional management support is needed unless otherwise documented below in the visit note. 

## 2017-01-20 ENCOUNTER — Encounter: Payer: Self-pay | Admitting: Obstetrics and Gynecology

## 2017-01-20 ENCOUNTER — Ambulatory Visit (INDEPENDENT_AMBULATORY_CARE_PROVIDER_SITE_OTHER): Payer: PPO | Admitting: Obstetrics and Gynecology

## 2017-01-20 VITALS — BP 130/70 | HR 60 | Resp 16 | Ht 64.0 in | Wt 139.4 lb

## 2017-01-20 DIAGNOSIS — M8580 Other specified disorders of bone density and structure, unspecified site: Secondary | ICD-10-CM

## 2017-01-20 DIAGNOSIS — Z01419 Encounter for gynecological examination (general) (routine) without abnormal findings: Secondary | ICD-10-CM | POA: Diagnosis not present

## 2017-01-20 NOTE — Patient Instructions (Signed)
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
## 2017-02-07 ENCOUNTER — Other Ambulatory Visit: Payer: Self-pay | Admitting: Family Medicine

## 2017-03-02 DIAGNOSIS — R102 Pelvic and perineal pain: Secondary | ICD-10-CM | POA: Diagnosis not present

## 2017-03-02 DIAGNOSIS — N3010 Interstitial cystitis (chronic) without hematuria: Secondary | ICD-10-CM | POA: Diagnosis not present

## 2017-03-02 DIAGNOSIS — N3020 Other chronic cystitis without hematuria: Secondary | ICD-10-CM | POA: Diagnosis not present

## 2017-03-08 DIAGNOSIS — D2272 Melanocytic nevi of left lower limb, including hip: Secondary | ICD-10-CM | POA: Diagnosis not present

## 2017-03-08 DIAGNOSIS — D2262 Melanocytic nevi of left upper limb, including shoulder: Secondary | ICD-10-CM | POA: Diagnosis not present

## 2017-03-08 DIAGNOSIS — D1801 Hemangioma of skin and subcutaneous tissue: Secondary | ICD-10-CM | POA: Diagnosis not present

## 2017-03-08 DIAGNOSIS — L821 Other seborrheic keratosis: Secondary | ICD-10-CM | POA: Diagnosis not present

## 2017-03-08 DIAGNOSIS — Z85828 Personal history of other malignant neoplasm of skin: Secondary | ICD-10-CM | POA: Diagnosis not present

## 2017-03-08 DIAGNOSIS — L814 Other melanin hyperpigmentation: Secondary | ICD-10-CM | POA: Diagnosis not present

## 2017-03-08 DIAGNOSIS — L570 Actinic keratosis: Secondary | ICD-10-CM | POA: Diagnosis not present

## 2017-03-08 DIAGNOSIS — D1800 Hemangioma unspecified site: Secondary | ICD-10-CM | POA: Insufficient documentation

## 2017-04-11 ENCOUNTER — Other Ambulatory Visit: Payer: Self-pay | Admitting: Family Medicine

## 2017-04-11 DIAGNOSIS — R7303 Prediabetes: Secondary | ICD-10-CM

## 2017-04-11 DIAGNOSIS — E785 Hyperlipidemia, unspecified: Secondary | ICD-10-CM

## 2017-04-14 ENCOUNTER — Other Ambulatory Visit: Payer: PPO

## 2017-04-14 ENCOUNTER — Ambulatory Visit (INDEPENDENT_AMBULATORY_CARE_PROVIDER_SITE_OTHER): Payer: PPO

## 2017-04-14 VITALS — BP 134/78 | HR 60 | Temp 97.9°F | Ht 63.5 in | Wt 140.5 lb

## 2017-04-14 DIAGNOSIS — R7303 Prediabetes: Secondary | ICD-10-CM

## 2017-04-14 DIAGNOSIS — E785 Hyperlipidemia, unspecified: Secondary | ICD-10-CM | POA: Diagnosis not present

## 2017-04-14 DIAGNOSIS — L720 Epidermal cyst: Secondary | ICD-10-CM | POA: Insufficient documentation

## 2017-04-14 DIAGNOSIS — Z0000 Encounter for general adult medical examination without abnormal findings: Secondary | ICD-10-CM

## 2017-04-14 DIAGNOSIS — L814 Other melanin hyperpigmentation: Secondary | ICD-10-CM | POA: Diagnosis not present

## 2017-04-14 DIAGNOSIS — Z85828 Personal history of other malignant neoplasm of skin: Secondary | ICD-10-CM | POA: Diagnosis not present

## 2017-04-14 DIAGNOSIS — L570 Actinic keratosis: Secondary | ICD-10-CM | POA: Diagnosis not present

## 2017-04-14 DIAGNOSIS — D485 Neoplasm of uncertain behavior of skin: Secondary | ICD-10-CM | POA: Diagnosis not present

## 2017-04-14 LAB — COMPREHENSIVE METABOLIC PANEL
ALBUMIN: 4 g/dL (ref 3.5–5.2)
ALK PHOS: 88 U/L (ref 39–117)
ALT: 16 U/L (ref 0–35)
AST: 17 U/L (ref 0–37)
BUN: 12 mg/dL (ref 6–23)
CHLORIDE: 104 meq/L (ref 96–112)
CO2: 31 mEq/L (ref 19–32)
Calcium: 9.5 mg/dL (ref 8.4–10.5)
Creatinine, Ser: 0.85 mg/dL (ref 0.40–1.20)
GFR: 69.81 mL/min (ref >60.00)
Glucose, Bld: 102 mg/dL — ABNORMAL HIGH (ref 70–99)
POTASSIUM: 4.6 meq/L (ref 3.5–5.1)
Sodium: 141 mEq/L (ref 135–145)
Total Bilirubin: 0.5 mg/dL (ref 0.2–1.2)
Total Protein: 6.6 g/dL (ref 6.0–8.3)

## 2017-04-14 LAB — LIPID PANEL
CHOLESTEROL: 152 mg/dL (ref 0–200)
HDL: 52.5 mg/dL (ref >39.00)
LDL CALC: 75 mg/dL (ref 0–99)
NonHDL: 99.07
Total CHOL/HDL Ratio: 3
Triglycerides: 121 mg/dL (ref 0.0–149.0)
VLDL: 24.2 mg/dL (ref 0.0–40.0)

## 2017-04-14 LAB — HEMOGLOBIN A1C: HEMOGLOBIN A1C: 6.3 % (ref 4.6–6.5)
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
## 2017-04-14 NOTE — Progress Notes (Signed)
Pre visit review using our clinic review tool, if applicable. No additional management support is needed unless otherwise documented below in the visit note. 

## 2017-04-14 NOTE — Progress Notes (Signed)
PCP notes:   Health maintenance:  No gaps identified.  Abnormal screenings:   Hearing - failed  Patient concerns:   Pt wants to discuss with PCP discontinuing Klonopin and resuming Xanax.  Nurse concerns:  None  Next PCP appt:   04/16/17 @ 0830

## 2017-04-14 NOTE — Patient Instructions (Signed)
Bianca Fry , Thank you for taking time to come for your Medicare Wellness Visit. I appreciate your ongoing commitment to your health goals. Please review the following plan we discussed and let me know if I can assist you in the future.   These are the goals we discussed: Goals    . Increase physical activity          Starting 04/14/2017, I will continue to walk at least 30 min 5 days per week.         This is a list of the screening recommended for you and due dates:  Health Maintenance  Topic Date Due  . Flu Shot  05/19/2017  . Mammogram  08/11/2018  . DTaP/Tdap/Td vaccine (2 - Td) 04/08/2021  . Tetanus Vaccine  04/08/2021  . Colon Cancer Screening  03/05/2025  . DEXA scan (bone density measurement)  Completed  .  Hepatitis C: One time screening is recommended by Center for Disease Control  (CDC) for  adults born from [birth year] through 1966.   Completed  . Pneumonia vaccines  Completed   Preventive Care for Adults  A healthy lifestyle and preventive care can promote health and wellness. Preventive health guidelines for adults include the following key practices.  . A routine yearly physical is a good way to check with your health care provider about your health and preventive screening. It is a chance to share any concerns and updates on your health and to receive a thorough exam.  . Visit your dentist for a routine exam and preventive care every 6 months. Brush your teeth twice a day and floss once a day. Good oral hygiene prevents tooth decay and gum disease.  . The frequency of eye exams is based on your age, health, family medical history, use  of contact lenses, and other factors. Follow your health care provider's ecommendations for frequency of eye exams.  . Eat a healthy diet. Foods like vegetables, fruits, whole grains, low-fat dairy products, and lean protein foods contain the nutrients you need without too many calories. Decrease your intake of foods high in solid fats,  added sugars, and salt. Eat the right amount of calories for you. Get information about a proper diet from your health care provider, if necessary.  . Regular physical exercise is one of the most important things you can do for your health. Most adults should get at least 150 minutes of moderate-intensity exercise (any activity that increases your heart rate and causes you to sweat) each week. In addition, most adults need muscle-strengthening exercises on 2 or more days a week.  Silver Sneakers may be a benefit available to you. To determine eligibility, you may visit the website: www.silversneakers.com or contact program at (430) 472-1734 Mon-Fri between 8AM-8PM.   . Maintain a healthy weight. The body mass index (BMI) is a screening tool to identify possible weight problems. It provides an estimate of body fat based on height and weight. Your health care provider can find your BMI and can help you achieve or maintain a healthy weight.   For adults 20 years and older: ? A BMI below 18.5 is considered underweight. ? A BMI of 18.5 to 24.9 is normal. ? A BMI of 25 to 29.9 is considered overweight. ? A BMI of 30 and above is considered obese.   . Maintain normal blood lipids and cholesterol levels by exercising and minimizing your intake of saturated fat. Eat a balanced diet with plenty of fruit and vegetables. Blood tests  for lipids and cholesterol should begin at age 27 and be repeated every 5 years. If your lipid or cholesterol levels are high, you are over 50, or you are at high risk for heart disease, you may need your cholesterol levels checked more frequently. Ongoing high lipid and cholesterol levels should be treated with medicines if diet and exercise are not working.  . If you smoke, find out from your health care provider how to quit. If you do not use tobacco, please do not start.  . If you choose to drink alcohol, please do not consume more than 2 drinks per day. One drink is  considered to be 12 ounces (355 mL) of beer, 5 ounces (148 mL) of wine, or 1.5 ounces (44 mL) of liquor.  . If you are 28-39 years old, ask your health care provider if you should take aspirin to prevent strokes.  . Use sunscreen. Apply sunscreen liberally and repeatedly throughout the day. You should seek shade when your shadow is shorter than you. Protect yourself by wearing long sleeves, pants, a wide-brimmed hat, and sunglasses year round, whenever you are outdoors.  . Once a month, do a whole body skin exam, using a mirror to look at the skin on your back. Tell your health care provider of new moles, moles that have irregular borders, moles that are larger than a pencil eraser, or moles that have changed in shape or color.

## 2017-04-15 NOTE — Progress Notes (Signed)
I reviewed health advisor's note, was available for consultation, and agree with documentation and plan.  

## 2017-04-16 ENCOUNTER — Ambulatory Visit (INDEPENDENT_AMBULATORY_CARE_PROVIDER_SITE_OTHER): Payer: PPO | Admitting: Family Medicine

## 2017-04-16 ENCOUNTER — Encounter: Payer: Self-pay | Admitting: Family Medicine

## 2017-04-16 VITALS — BP 124/70 | HR 68 | Ht 63.5 in | Wt 140.0 lb

## 2017-04-16 DIAGNOSIS — R7303 Prediabetes: Secondary | ICD-10-CM

## 2017-04-16 DIAGNOSIS — Z0000 Encounter for general adult medical examination without abnormal findings: Secondary | ICD-10-CM | POA: Diagnosis not present

## 2017-04-16 DIAGNOSIS — K2100 Gastro-esophageal reflux disease with esophagitis, without bleeding: Secondary | ICD-10-CM

## 2017-04-16 DIAGNOSIS — I25119 Atherosclerotic heart disease of native coronary artery with unspecified angina pectoris: Secondary | ICD-10-CM | POA: Diagnosis not present

## 2017-04-16 DIAGNOSIS — F4321 Adjustment disorder with depressed mood: Secondary | ICD-10-CM | POA: Diagnosis not present

## 2017-04-16 DIAGNOSIS — R0989 Other specified symptoms and signs involving the circulatory and respiratory systems: Secondary | ICD-10-CM | POA: Diagnosis not present

## 2017-04-16 DIAGNOSIS — Z7189 Other specified counseling: Secondary | ICD-10-CM

## 2017-04-16 DIAGNOSIS — E785 Hyperlipidemia, unspecified: Secondary | ICD-10-CM

## 2017-04-16 DIAGNOSIS — Q100 Congenital ptosis: Secondary | ICD-10-CM

## 2017-04-16 DIAGNOSIS — I10 Essential (primary) hypertension: Secondary | ICD-10-CM

## 2017-04-16 DIAGNOSIS — I6522 Occlusion and stenosis of left carotid artery: Secondary | ICD-10-CM | POA: Insufficient documentation

## 2017-04-16 DIAGNOSIS — Z79899 Other long term (current) drug therapy: Secondary | ICD-10-CM | POA: Diagnosis not present

## 2017-04-16 MED ORDER — ALPRAZOLAM 0.5 MG PO TABS: 0.5000 mg | ORAL_TABLET | Freq: Every evening | ORAL | 0 refills | Status: DC | PRN

## 2017-04-16 NOTE — Progress Notes (Signed)
BP 124/70   Pulse 68   Ht 5' 3.5" (1.613 m)   Wt 140 lb (63.5 kg)   SpO2 96%   BMI 24.41 kg/m    CC: CPE Subjective:    Patient ID: Bianca Fry, female    DOB: 1945-12-02, 71 y.o.   MRN: [id number]  HPI: Bianca Fry is a 71 y.o. female presenting on 04/16/2017 for Annual Exam   Saw Katha Cabal earlier this week for medicare wellness visit. Note reviewed.   Saw derm last earlier this week with lip biopsy for lesion and several spots frozen left cheek.  Known neck pain "slipped disc C4/5".  Increased stress recently.   Known anxiety - feels klonopin causes epigastric and throat burning. No better with omeprazole or with zantac. Asks about return to xanax (prior on this 2015). Intolerant to valium (feels bad) and ativan (overly sedating) in the past. Has tried antidepressants in the past - didn't like apathy. Ongoing family stressors.   Preventative: COLONOSCOPY Date: 03/2015 mod diverticulosis with focal colitis [facility] Plan) rpt 10 yrs Breast cancer screening - dx bilat mammo 07/2016 - reassuring. rec rpt 1 yr  Well woman exam - yearly GYN Dr Josefa Half (01/2017) DEXA scan - 01/2015 - osteopenia T -2.0 in hip Flu shot yearly  Tdap 2012  Pneumovax 2010, prevnar 2015, pnemovax 03/2016 zostavax - 07/2010  Shingrix - discussed Advanced directive discussion - Has not set up. Would want husband to be HCPOA. Packet provided last visit Seat belt use discussed  Sunscreen use discussed, sees derm Non smoker Alcohol - none  Daily caffeine  Lives with husband and 1 dog and 3 cats  Occupation: retired, was in [employer]  Activity: walking 20-30 min/day  Diet: good water, fruits/vegetables daily   Relevant past medical, surgical, family and social history reviewed and updated as indicated. Interim medical history since our last visit reviewed. Allergies and medications reviewed and updated. Outpatient Medications Prior to Visit  Medication Sig Dispense Refill  . aspirin EC 81 MG EC tablet  Take 1 tablet (81 mg total) by mouth daily. 30 tablet 2  . atenolol (TENORMIN) 25 MG tablet TAKE ONE TABLET BY MOUTH ONCE DAILY 90 tablet 3  . atorvastatin (LIPITOR) 20 MG tablet TAKE 1 TABLET BY MOUTH 2 TIMES A WEEK, ON MONDAY AND FRIDAY. 24 tablet 1  . Calcium Carb-Cholecalciferol (CALCIUM-VITAMIN D) 600-400 MG-UNIT TABS Take 1 tablet by mouth daily.    Marland Kitchen dicyclomine (BENTYL) 10 MG capsule Take 1 capsule (10 mg total) by mouth 2 (two) times daily as needed for spasms. 30 capsule 3  . estradiol (ESTRACE) 0.1 MG/GM vaginal cream Use 1/2 g vaginally every night for the first 2 weeks, then use 1/2 g vaginally two to three times a week. 42.5 g 2  . fexofenadine (ALLEGRA) 180 MG tablet Take 180 mg by mouth daily as needed for allergies.     Marland Kitchen GARLIC PO Take 1 capsule by mouth daily.    . Omega-3 Fatty Acids (FISH OIL) 1000 MG CAPS Take 1,000 mg by mouth daily.    Marland Kitchen omeprazole (PRILOSEC) 40 MG capsule TAKE ONE CAPSULE BY MOUTH ONCE DAILY 90 capsule 3  . ondansetron (ZOFRAN) 8 MG tablet TAKE ONE TABLET BY MOUTH EVERY 8 HOURS AS NEEDED FOR NAUSEA AND VOMITING 90 tablet 1  . ranitidine (ZANTAC) 150 MG tablet Take 1 tablet (150 mg total) by mouth at bedtime.    . clonazePAM (KLONOPIN) 0.5 MG tablet TAKE ONE-HALF TO ONE TABLET BY  MOUTH TWICE DAILY AS NEEDED 40 tablet 0   No facility-administered medications prior to visit.      Per HPI unless specifically indicated in ROS section below Review of Systems  Constitutional: Negative for activity change, appetite change, chills, fatigue, fever and unexpected weight change.  HENT: Negative for hearing loss.   Eyes: Negative for visual disturbance.  Respiratory: Negative for cough, chest tightness ( ), shortness of breath and wheezing.   Cardiovascular: Positive for chest pain (intermittent, not pressure - previously resolved with injection into chest). Negative for palpitations and leg swelling.  Gastrointestinal: Negative for abdominal distention,  abdominal pain, blood in stool, constipation, diarrhea, nausea and vomiting.  Genitourinary: Negative for difficulty urinating and hematuria.  Musculoskeletal: Negative for arthralgias, myalgias and neck pain.  Skin: Negative for rash.  Neurological: Negative for dizziness, seizures, syncope and headaches.  Hematological: Negative for adenopathy. Does not bruise/bleed easily.  Psychiatric/Behavioral: Negative for dysphoric mood. The patient is not nervous/anxious.        Objective:    BP 124/70   Pulse 68   Ht 5' 3.5" (1.613 m)   Wt 140 lb (63.5 kg)   SpO2 96%   BMI 24.41 kg/m   Wt Readings from Last 3 Encounters:  04/16/17 140 lb (63.5 kg)  04/14/17 140 lb 8 oz (63.7 kg)  01/20/17 139 lb 6.4 oz (63.2 kg)    Physical Exam  Constitutional: She is oriented to person, place, and time. She appears well-developed and well-nourished. No distress.  HENT:  Head: Normocephalic and atraumatic.  Right Ear: Hearing, tympanic membrane, external ear and ear canal normal.  Left Ear: Hearing, tympanic membrane, external ear and ear canal normal.  Nose: Nose normal.  Mouth/Throat: Uvula is midline, oropharynx is clear and moist and mucous membranes are normal. No oropharyngeal exudate, posterior oropharyngeal edema or posterior oropharyngeal erythema.  Eyes: Conjunctivae and EOM are normal. Pupils are equal, round, and reactive to light. No scleral icterus.  Neck: Normal range of motion. Neck supple. Carotid bruit is present (faint left). No thyromegaly present.  Cardiovascular: Normal rate, regular rhythm, normal heart sounds and intact distal pulses.   No murmur heard. Pulses:      Radial pulses are 2+ on the right side, and 2+ on the left side.  Pulmonary/Chest: Effort normal and breath sounds normal. No respiratory distress. She has no wheezes. She has no rales.  Abdominal: Soft. Bowel sounds are normal. She exhibits no distension and no mass. There is no tenderness. There is no rebound and  no guarding.  Musculoskeletal: Normal range of motion. She exhibits no edema.  Lymphadenopathy:    She has no cervical adenopathy.  Neurological: She is alert and oriented to person, place, and time.  CN grossly intact, station and gait intact  Skin: Skin is warm and dry. No rash noted.  Psychiatric: She has a normal mood and affect. Her behavior is normal. Judgment and thought content normal.  Nursing note and vitals reviewed.  Results for orders placed or performed in visit on 04/14/17  Lipid panel  Result Value Ref Range   Cholesterol 152 0 - 200 mg/dL   Triglycerides 121.0 0.0 - 149.0 mg/dL   HDL 52.50 >39.00 mg/dL   VLDL 24.2 0.0 - 40.0 mg/dL   LDL Cholesterol 75 0 - 99 mg/dL   Total CHOL/HDL Ratio 3    NonHDL 99.07   Comprehensive metabolic panel  Result Value Ref Range   Sodium 141 135 - 145 mEq/L   Potassium  4.6 3.5 - 5.1 mEq/L   Chloride 104 96 - 112 mEq/L   CO2 31 19 - 32 mEq/L   Glucose, Bld 102 (H) 70 - 99 mg/dL   BUN 12 6 - 23 mg/dL   Creatinine, Ser 0.85 0.40 - 1.20 mg/dL   Total Bilirubin 0.5 0.2 - 1.2 mg/dL   Alkaline Phosphatase 88 39 - 117 U/L   AST 17 0 - 37 U/L   ALT 16 0 - 35 U/L   Total Protein 6.6 6.0 - 8.3 g/dL   Albumin 4.0 3.5 - 5.2 g/dL   Calcium 9.5 8.4 - 10.5 mg/dL   GFR 69.81 >60.00 mL/min  Hemoglobin A1c  Result Value Ref Range   Hgb A1c MFr Bld 6.3 4.6 - 6.5 %      Assessment & Plan:   Problem List Items Addressed This Visit    Adjustment disorder with depressed mood    Pt endorses intolerance to prior antidepressants tried.  Has treated with PRN benzo over last several years.  Reviewed healthy stress relieving strategies.  Requests switch back to xanax due to concern over klonopin causing worsening heartburn. Intolerant to other benzos tried.  Update UDS and controlled substance agreement today.       Advanced care planning/counseling discussion    Advanced directive discussion - Has not set up. Would want husband to be HCPOA.  Packet provided last visit      CAD (coronary artery disease)    Nonobstructive by latest eval mid 2017.       Essential hypertension    Chronic, stable. Continue current regimen.       GERD    Continue omeprazole 40mg  daily. Pt worried klonopin causing worsening heartburn - see below.        Health maintenance examination - Primary    Preventative protocols reviewed and updated unless pt declined. Discussed healthy diet and lifestyle.       HLD (hyperlipidemia)    Chronic, stable. Continue current regimen.       Left carotid bruit    Very faint. Will just monitor for now.       Prediabetes    Encouraged avoid added sugars in diet.           Follow up plan: Return in about 6 months (around 10/16/2017) for follow up visit.  Ria Bush, MD

## 2017-04-16 NOTE — Assessment & Plan Note (Signed)
Encouraged avoid added sugars in diet.

## 2017-04-16 NOTE — Patient Instructions (Addendum)
If interested, check with local pharmacy about new 2 shot shingles series (shingrix). Ok to try xanax - printed out today. Update UDS today.  Return in 6 months for follow up visit.   Health Maintenance, Female Adopting a healthy lifestyle and getting preventive care can go a long way to promote health and wellness. Talk with your health care provider about what schedule of regular examinations is right for you. This is a good chance for you to check in with your provider about disease prevention and staying healthy. In between checkups, there are plenty of things you can do on your own. Experts have done a lot of research about which lifestyle changes and preventive measures are most likely to keep you healthy. Ask your health care provider for more information. Weight and diet Eat a healthy diet  Be sure to include plenty of vegetables, fruits, low-fat dairy products, and lean protein.  Do not eat a lot of foods high in solid fats, added sugars, or salt.  Get regular exercise. This is one of the most important things you can do for your health. ? Most adults should exercise for at least 150 minutes each week. The exercise should increase your heart rate and make you sweat (moderate-intensity exercise). ? Most adults should also do strengthening exercises at least twice a week. This is in addition to the moderate-intensity exercise.  Maintain a healthy weight  Body mass index (BMI) is a measurement that can be used to identify possible weight problems. It estimates body fat based on height and weight. Your health care provider can help determine your BMI and help you achieve or maintain a healthy weight.  For females 80 years of age and older: ? A BMI below 18.5 is considered underweight. ? A BMI of 18.5 to 24.9 is normal. ? A BMI of 25 to 29.9 is considered overweight. ? A BMI of 30 and above is considered obese.  Watch levels of cholesterol and blood lipids  You should start having  your blood tested for lipids and cholesterol at 72 years of age, then have this test every 5 years.  You may need to have your cholesterol levels checked more often if: ? Your lipid or cholesterol levels are high. ? You are older than 72 years of age. ? You are at high risk for heart disease.  Cancer screening Lung Cancer  Lung cancer screening is recommended for adults 63-54 years old who are at high risk for lung cancer because of a history of smoking.  A yearly low-dose CT scan of the lungs is recommended for people who: ? Currently smoke. ? Have quit within the past 15 years. ? Have at least a 30-pack-year history of smoking. A pack year is smoking an average of one pack of cigarettes a day for 1 year.  Yearly screening should continue until it has been 15 years since you quit.  Yearly screening should stop if you develop a health problem that would prevent you from having lung cancer treatment.  Breast Cancer  Practice breast self-awareness. This means understanding how your breasts normally appear and feel.  It also means doing regular breast self-exams. Let your health care provider know about any changes, no matter how small.  If you are in your 20s or 30s, you should have a clinical breast exam (CBE) by a health care provider every 1-3 years as part of a regular health exam.  If you are 62 or older, have a CBE every year.  Also consider having a breast X-ray (mammogram) every year.  If you have a family history of breast cancer, talk to your health care provider about genetic screening.  If you are at high risk for breast cancer, talk to your health care provider about having an MRI and a mammogram every year.  Breast cancer gene (BRCA) assessment is recommended for women who have family members with BRCA-related cancers. BRCA-related cancers include: ? Breast. ? Ovarian. ? Tubal. ? Peritoneal cancers.  Results of the assessment will determine the need for genetic  counseling and BRCA1 and BRCA2 testing.  Cervical Cancer Your health care provider may recommend that you be screened regularly for cancer of the pelvic organs (ovaries, uterus, and vagina). This screening involves a pelvic examination, including checking for microscopic changes to the surface of your cervix (Pap test). You may be encouraged to have this screening done every 3 years, beginning at age 53.  For women ages 31-65, health care providers may recommend pelvic exams and Pap testing every 3 years, or they may recommend the Pap and pelvic exam, combined with testing for human papilloma virus (HPV), every 5 years. Some types of HPV increase your risk of cervical cancer. Testing for HPV may also be done on women of any age with unclear Pap test results.  Other health care providers may not recommend any screening for nonpregnant women who are considered low risk for pelvic cancer and who do not have symptoms. Ask your health care provider if a screening pelvic exam is right for you.  If you have had past treatment for cervical cancer or a condition that could lead to cancer, you need Pap tests and screening for cancer for at least 20 years after your treatment. If Pap tests have been discontinued, your risk factors (such as having a new sexual partner) need to be reassessed to determine if screening should resume. Some women have medical problems that increase the chance of getting cervical cancer. In these cases, your health care provider may recommend more frequent screening and Pap tests.  Colorectal Cancer  This type of cancer can be detected and often prevented.  Routine colorectal cancer screening usually begins at 72 years of age and continues through 72 years of age.  Your health care provider may recommend screening at an earlier age if you have risk factors for colon cancer.  Your health care provider may also recommend using home test kits to check for hidden blood in the  stool.  A small camera at the end of a tube can be used to examine your colon directly (sigmoidoscopy or colonoscopy). This is done to check for the earliest forms of colorectal cancer.  Routine screening usually begins at age 66.  Direct examination of the colon should be repeated every 5-10 years through 72 years of age. However, you may need to be screened more often if early forms of precancerous polyps or small growths are found.  Skin Cancer  Check your skin from head to toe regularly.  Tell your health care provider about any new moles or changes in moles, especially if there is a change in a mole's shape or color.  Also tell your health care provider if you have a mole that is larger than the size of a pencil eraser.  Always use sunscreen. Apply sunscreen liberally and repeatedly throughout the day.  Protect yourself by wearing long sleeves, pants, a wide-brimmed hat, and sunglasses whenever you are outside.  Heart disease, diabetes, and high  blood pressure  High blood pressure causes heart disease and increases the risk of stroke. High blood pressure is more likely to develop in: ? People who have blood pressure in the high end of the normal range (130-139/85-89 mm Hg). ? People who are overweight or obese. ? People who are African American.  If you are 3-100 years of age, have your blood pressure checked every 3-5 years. If you are 49 years of age or older, have your blood pressure checked every year. You should have your blood pressure measured twice-once when you are at a hospital or clinic, and once when you are not at a hospital or clinic. Record the average of the two measurements. To check your blood pressure when you are not at a hospital or clinic, you can use: ? An automated blood pressure machine at a pharmacy. ? A home blood pressure monitor.  If you are between 62 years and 2 years old, ask your health care provider if you should take aspirin to prevent  strokes.  Have regular diabetes screenings. This involves taking a blood sample to check your fasting blood sugar level. ? If you are at a normal weight and have a low risk for diabetes, have this test once every three years after 72 years of age. ? If you are overweight and have a high risk for diabetes, consider being tested at a younger age or more often. Preventing infection Hepatitis B  If you have a higher risk for hepatitis B, you should be screened for this virus. You are considered at high risk for hepatitis B if: ? You were born in a country where hepatitis B is common. Ask your health care provider which countries are considered high risk. ? Your parents were born in a high-risk country, and you have not been immunized against hepatitis B (hepatitis B vaccine). ? You have HIV or AIDS. ? You use needles to inject street drugs. ? You live with someone who has hepatitis B. ? You have had sex with someone who has hepatitis B. ? You get hemodialysis treatment. ? You take certain medicines for conditions, including cancer, organ transplantation, and autoimmune conditions.  Hepatitis C  Blood testing is recommended for: ? Everyone born from [birth year] through 1966. ? Anyone with known risk factors for hepatitis C.  Sexually transmitted infections (STIs)  You should be screened for sexually transmitted infections (STIs) including gonorrhea and chlamydia if: ? You are sexually active and are younger than 72 years of age. ? You are older than 72 years of age and your health care provider tells you that you are at risk for this type of infection. ? Your sexual activity has changed since you were last screened and you are at an increased risk for chlamydia or gonorrhea. Ask your health care provider if you are at risk.  If you do not have HIV, but are at risk, it may be recommended that you take a prescription medicine daily to prevent HIV infection. This is called pre-exposure prophylaxis  (PrEP). You are considered at risk if: ? You are sexually active and do not regularly use condoms or know the HIV status of your partner(s). ? You take drugs by injection. ? You are sexually active with a partner who has HIV.  Talk with your health care provider about whether you are at high risk of being infected with HIV. If you choose to begin PrEP, you should first be tested for HIV. You should then be tested  every 3 months for as long as you are taking PrEP. Pregnancy  If you are premenopausal and you may become pregnant, ask your health care provider about preconception counseling.  If you may become pregnant, take 400 to 800 micrograms (mcg) of folic acid every day.  If you want to prevent pregnancy, talk to your health care provider about birth control (contraception). Osteoporosis and menopause  Osteoporosis is a disease in which the bones lose minerals and strength with aging. This can result in serious bone fractures. Your risk for osteoporosis can be identified using a bone density scan.  If you are 66 years of age or older, or if you are at risk for osteoporosis and fractures, ask your health care provider if you should be screened.  Ask your health care provider whether you should take a calcium or vitamin D supplement to lower your risk for osteoporosis.  Menopause may have certain physical symptoms and risks.  Hormone replacement therapy may reduce some of these symptoms and risks. Talk to your health care provider about whether hormone replacement therapy is right for you. Follow these instructions at home:  Schedule regular health, dental, and eye exams.  Stay current with your immunizations.  Do not use any tobacco products including cigarettes, chewing tobacco, or electronic cigarettes.  If you are pregnant, do not drink alcohol.  If you are breastfeeding, limit how much and how often you drink alcohol.  Limit alcohol intake to no more than 1 drink per day for  nonpregnant women. One drink equals 12 ounces of beer, 5 ounces of wine, or 1 ounces of hard liquor.  Do not use street drugs.  Do not share needles.  Ask your health care provider for help if you need support or information about quitting drugs.  Tell your health care provider if you often feel depressed.  Tell your health care provider if you have ever been abused or do not feel safe at home. This information is not intended to replace advice given to you by your health care provider. Make sure you discuss any questions you have with your health care provider. Document Released: 04/20/2011 Document Revised: 03/12/2016 Document Reviewed: 07/09/2015 Elsevier Interactive Patient Education  [employer].

## 2017-04-16 NOTE — Assessment & Plan Note (Signed)
Pt endorses intolerance to prior antidepressants tried.  Has treated with PRN benzo over last several years.  Reviewed healthy stress relieving strategies.  Requests switch back to xanax due to concern over klonopin causing worsening heartburn. Intolerant to other benzos tried.  Update UDS and controlled substance agreement today.

## 2017-04-16 NOTE — Assessment & Plan Note (Signed)
Nonobstructive by latest eval mid 2017.

## 2017-04-16 NOTE — Assessment & Plan Note (Signed)
Advanced directive discussion - Has not set up. Would want husband to be HCPOA. Packet providedlast visit.  

## 2017-04-16 NOTE — Assessment & Plan Note (Signed)
Chronic, stable. Continue current regimen. 

## 2017-04-16 NOTE — Assessment & Plan Note (Addendum)
Continue omeprazole 40mg  daily. Pt worried klonopin causing worsening heartburn - see below.

## 2017-04-16 NOTE — Assessment & Plan Note (Signed)
Very faint. Will just monitor for now.

## 2017-04-16 NOTE — Assessment & Plan Note (Signed)
Preventative protocols reviewed and updated unless pt declined. Discussed healthy diet and lifestyle.  

## 2017-04-16 NOTE — Progress Notes (Signed)
Pre visit review using our clinic review tool, if applicable. No additional management support is needed unless otherwise documented below in the visit note. 

## 2017-04-26 DIAGNOSIS — H02051 Trichiasis without entropian right upper eyelid: Secondary | ICD-10-CM | POA: Diagnosis not present

## 2017-06-06 ENCOUNTER — Other Ambulatory Visit: Payer: Self-pay | Admitting: Family Medicine

## 2017-06-20 ENCOUNTER — Other Ambulatory Visit: Payer: Self-pay | Admitting: Family Medicine

## 2017-06-22 NOTE — Telephone Encounter (Signed)
Last Rx 03/2016 #30 3R. Last OV 03/2017. pls advise

## 2017-07-05 DIAGNOSIS — H65112 Acute and subacute allergic otitis media (mucoid) (sanguinous) (serous), left ear: Secondary | ICD-10-CM | POA: Diagnosis not present

## 2017-07-05 DIAGNOSIS — H6122 Impacted cerumen, left ear: Secondary | ICD-10-CM | POA: Diagnosis not present

## 2017-07-11 ENCOUNTER — Other Ambulatory Visit: Payer: Self-pay | Admitting: Family Medicine

## 2017-07-12 NOTE — Telephone Encounter (Signed)
Last filled: 04/17/17, #40 Last OV (CPE): 04/16/17 Next OV:  10/15/17

## 2017-07-13 NOTE — Telephone Encounter (Signed)
Left refill on voice mail at pharmacy  

## 2017-07-13 NOTE — Telephone Encounter (Signed)
plz phone in. 

## 2017-07-14 DIAGNOSIS — L814 Other melanin hyperpigmentation: Secondary | ICD-10-CM | POA: Diagnosis not present

## 2017-07-14 DIAGNOSIS — Z85828 Personal history of other malignant neoplasm of skin: Secondary | ICD-10-CM | POA: Diagnosis not present

## 2017-07-14 DIAGNOSIS — L84 Corns and callosities: Secondary | ICD-10-CM | POA: Diagnosis not present

## 2017-07-26 ENCOUNTER — Other Ambulatory Visit: Payer: Self-pay | Admitting: Family Medicine

## 2017-07-26 DIAGNOSIS — M47812 Spondylosis without myelopathy or radiculopathy, cervical region: Secondary | ICD-10-CM | POA: Diagnosis not present

## 2017-07-26 DIAGNOSIS — H65111 Acute and subacute allergic otitis media (mucoid) (sanguinous) (serous), right ear: Secondary | ICD-10-CM | POA: Diagnosis not present

## 2017-07-27 ENCOUNTER — Other Ambulatory Visit: Payer: Self-pay | Admitting: Obstetrics and Gynecology

## 2017-07-27 DIAGNOSIS — Z1231 Encounter for screening mammogram for malignant neoplasm of breast: Secondary | ICD-10-CM

## 2017-08-05 ENCOUNTER — Ambulatory Visit (INDEPENDENT_AMBULATORY_CARE_PROVIDER_SITE_OTHER)
Admission: RE | Admit: 2017-08-05 | Discharge: 2017-08-05 | Disposition: A | Discharge status: Home / Self Care | Payer: PPO | Source: Ambulatory Visit | Attending: Family Medicine | Admitting: Family Medicine

## 2017-08-05 ENCOUNTER — Encounter: Payer: Self-pay | Admitting: Family Medicine

## 2017-08-05 ENCOUNTER — Ambulatory Visit (INDEPENDENT_AMBULATORY_CARE_PROVIDER_SITE_OTHER): Payer: PPO | Admitting: Family Medicine

## 2017-08-05 VITALS — BP 118/60 | HR 64 | Temp 97.7°F | Wt 142.2 lb

## 2017-08-05 DIAGNOSIS — M542 Cervicalgia: Secondary | ICD-10-CM | POA: Diagnosis not present

## 2017-08-05 DIAGNOSIS — Z23 Encounter for immunization: Secondary | ICD-10-CM | POA: Diagnosis not present

## 2017-08-05 NOTE — Addendum Note (Signed)
Addended by: Brenton Grills on: 12/28/8116 09:20 AM   Modules accepted: Orders

## 2017-08-05 NOTE — Assessment & Plan Note (Signed)
Anticipate cervical strain. Given longevity, will update cervical films. Pt agrees with plan. Continue tylenol, heating pad, gentle stretching exercises, update if not improving with treatment. Discussed muscle relaxant, pt declines at this time. Pt agrees with plan.

## 2017-08-05 NOTE — Progress Notes (Signed)
BP 118/60 (BP Location: Left Arm, Patient Position: Sitting, Cuff Size: Normal)   Pulse 64   Temp 97.7 F (36.5 C) (Oral)   Wt 142 lb 4 oz (64.5 kg)   SpO2 97%   BMI 24.80 kg/m    CC: worsening neck and chest pain Subjective:    Patient ID: Bianca Fry, female    DOB: February 07, 1946, 71 y.o.   MRN: [id number]  HPI: Bianca Fry is a 71 y.o. female presenting on 08/05/2017 for Neck Pain (in anterior neck. Occurs when trying to lie down, especially on left side. Has had for yrs, worsened in last few months)   Progressively worsening anterior chest and left sided neck pain worse when trying to sleep at night. Present for years, worse over the last 3-4 months. Feels better when she wakes up. Pain described as ache and occasional burning pain. She has been using her phone more recently.   No inciting trauma, fall or injury. No fevers/chills, shooting pain down arms, numbness or weakness down the arms. No pressure/tightness in chest. No cough.   Tylenol and aspirin help. Hasn't tried ice/heating pad.   Known C4/5 "slippage" Endorses h/o TMJ arthritis.  S/o multiple lumbar surgeries x3 (2009).  Relevant past medical, surgical, family and social history reviewed and updated as indicated. Interim medical history since our last visit reviewed. Allergies and medications reviewed and updated. Outpatient Medications Prior to Visit  Medication Sig Dispense Refill  . ALPRAZolam (XANAX) 0.5 MG tablet TAKE 1 TABLET BY MOUTH AT BEDTIME AS NEEDED FOR ANXIETY 40 tablet 0  . aspirin EC 81 MG EC tablet Take 1 tablet (81 mg total) by mouth daily. 30 tablet 2  . atenolol (TENORMIN) 25 MG tablet TAKE ONE TABLET BY MOUTH ONCE DAILY 90 tablet 3  . atorvastatin (LIPITOR) 20 MG tablet TAKE 1 TABLET BY MOUTH TWICE A WEEK ON MONDAY AND FRIDAY 24 tablet 2  . Calcium Carb-Cholecalciferol (CALCIUM-VITAMIN D) 600-400 MG-UNIT TABS Take 1 tablet by mouth daily.    Marland Kitchen dicyclomine (BENTYL) 10 MG capsule TAKE ONE  CAPSULE BY MOUTH TWICE DAILY AS NEEDED FOR SPASMS. 30 capsule 3  . estradiol (ESTRACE) 0.1 MG/GM vaginal cream Use 1/2 g vaginally every night for the first 2 weeks, then use 1/2 g vaginally two to three times a week. 42.5 g 2  . fexofenadine (ALLEGRA) 180 MG tablet Take 180 mg by mouth daily as needed for allergies.     Marland Kitchen GARLIC PO Take 1 capsule by mouth daily.    . Omega-3 Fatty Acids (FISH OIL) 1000 MG CAPS Take 1,000 mg by mouth daily.    Marland Kitchen omeprazole (PRILOSEC) 40 MG capsule TAKE ONE CAPSULE BY MOUTH ONCE DAILY 90 capsule 3  . ondansetron (ZOFRAN) 8 MG tablet TAKE ONE TABLET BY MOUTH EVERY 8 HOURS AS NEEDED FOR NAUSEA AND VOMITING 90 tablet 1  . ranitidine (ZANTAC) 150 MG tablet Take 1 tablet (150 mg total) by mouth at bedtime.     No facility-administered medications prior to visit.      Per HPI unless specifically indicated in ROS section below Review of Systems     Objective:    BP 118/60 (BP Location: Left Arm, Patient Position: Sitting, Cuff Size: Normal)   Pulse 64   Temp 97.7 F (36.5 C) (Oral)   Wt 142 lb 4 oz (64.5 kg)   SpO2 97%   BMI 24.80 kg/m   Wt Readings from Last 3 Encounters:  08/05/17 142 lb 4  oz (64.5 kg)  04/16/17 140 lb (63.5 kg)  04/14/17 140 lb 8 oz (63.7 kg)    Physical Exam  Constitutional: She is oriented to person, place, and time. She appears well-developed and well-nourished. No distress.  HENT:  Head: Normocephalic and atraumatic.  Mouth/Throat: Oropharynx is clear and moist. No oropharyngeal exudate.  Eyes: Pupils are equal, round, and reactive to light. Conjunctivae are normal.  Cardiovascular: Normal rate, regular rhythm, normal heart sounds and intact distal pulses.   No murmur heard. Pulmonary/Chest: Effort normal and breath sounds normal. No respiratory distress. She has no wheezes. She has no rales. She exhibits tenderness (mild discomfort to palpation along mid sternum).  Musculoskeletal: She exhibits no edema.  Mild discomfort  midline lower cervical neck as well as L paraspinous mm tenderness/tightness to palpation  FROM at shoulders without pain anterior shoulders or chest  Neurological: She is alert and oriented to person, place, and time.  Reflex Scores:      Bicep reflexes are 2+ on the right side and 2+ on the left side. Sensation intact BUE 5/5 strength BUE Grip strength intact Neg spurling test  Nursing note and vitals reviewed.      Assessment & Plan:   Problem List Items Addressed This Visit    Neck pain on left side - Primary    Anticipate cervical strain. Given longevity, will update cervical films. Pt agrees with plan. Continue tylenol, heating pad, gentle stretching exercises, update if not improving with treatment. Discussed muscle relaxant, pt declines at this time. Pt agrees with plan.       Relevant Orders   DG Cervical Spine Complete       Follow up plan: Return if symptoms worsen or fail to improve.  Ria Bush, MD

## 2017-08-05 NOTE — Patient Instructions (Addendum)
You do have tightness of neck muscles on the left. Gentle stretching, heating pad to neck, tylenol.  Return tomorrow for neck xrays.  Exercises provided today.  Let us know if not improving with above.

## 2017-08-17 ENCOUNTER — Ambulatory Visit
Admission: RE | Admit: 2017-08-17 | Discharge: 2017-08-17 | Disposition: A | Discharge status: Home / Self Care | Payer: PPO | Source: Ambulatory Visit | Attending: Obstetrics and Gynecology | Admitting: Obstetrics and Gynecology

## 2017-08-17 DIAGNOSIS — M8580 Other specified disorders of bone density and structure, unspecified site: Secondary | ICD-10-CM

## 2017-08-17 DIAGNOSIS — Z1231 Encounter for screening mammogram for malignant neoplasm of breast: Secondary | ICD-10-CM

## 2017-08-17 DIAGNOSIS — Z780 Asymptomatic menopausal state: Secondary | ICD-10-CM | POA: Diagnosis not present

## 2017-08-17 DIAGNOSIS — M8589 Other specified disorders of bone density and structure, multiple sites: Secondary | ICD-10-CM | POA: Diagnosis not present

## 2017-08-27 ENCOUNTER — Encounter: Payer: Self-pay | Admitting: Obstetrics and Gynecology

## 2017-08-27 ENCOUNTER — Ambulatory Visit (INDEPENDENT_AMBULATORY_CARE_PROVIDER_SITE_OTHER): Payer: PPO | Admitting: Obstetrics and Gynecology

## 2017-08-27 VITALS — BP 122/60 | HR 76 | Ht 64.0 in | Wt 143.0 lb

## 2017-08-27 DIAGNOSIS — M8580 Other specified disorders of bone density and structure, unspecified site: Secondary | ICD-10-CM | POA: Diagnosis not present
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
## 2017-08-27 NOTE — Patient Instructions (Signed)
Your goal is to take calcium 600 mg with vitamin D 400 IU added, once in the morning and once in the evening.   You do not need to supplement calcium and vitamin D if you eat 4 servings of dairy products per day.   We will recheck your bone density in 2 years.

## 2017-08-28 ENCOUNTER — Other Ambulatory Visit: Payer: Self-pay | Admitting: Obstetrics and Gynecology

## 2017-08-28 DIAGNOSIS — R7989 Other specified abnormal findings of blood chemistry: Secondary | ICD-10-CM

## 2017-08-28 LAB — VITAMIN D 25 HYDROXY (VIT D DEFICIENCY, FRACTURES): Vit D, 25-Hydroxy: 22.3 ng/mL — ABNORMAL LOW (ref 30.0–100.0)

## 2017-08-28 LAB — PARATHYROID HORMONE, INTACT (NO CA): PTH: 29 pg/mL (ref 15–65)

## 2017-09-03 ENCOUNTER — Telehealth: Payer: Self-pay

## 2017-09-03 MED ORDER — VITAMIN D (ERGOCALCIFEROL) 1.25 MG (50000 UNIT) PO CAPS: ORAL_CAPSULE | ORAL | 0 refills | Status: DC

## 2017-09-03 NOTE — Telephone Encounter (Signed)
Spoke with patient and notified of lab results. Rx for Vitamin D sent to pharmacy. Patient states at last OV she was told she might be able to get estrogen cream cheaper at a compounding pharmacy. [facility] in [place] is a [employer], [employer] and she would like Dr.Silva to mail her a Rx for Estrace cream(compounded estrogen cream) to take to pharmacy so she can price. She states Estrace is very expensive. Routed to Dr.Silva

## 2017-09-03 NOTE — Telephone Encounter (Signed)
Spoke with patient and notified of normal PTH and low Vitamin D. Will send RX for Vitamin D 50,000 IU to pharmacy and made lab appointment to recheck Vitamin D 11-22-17 10:00 am.   Patient states Dr.Silva offered to send RX for compounded Estrogen cream to pharmacy for her in [place] if she could find a [employer]ist, newspaper in [place]. Patient states [facility] [address] [place] does compounding if we could mail Rx to her she would appreciate it because Estrace is very expensive. She would like to check price with pharmacy before they fill it. Advised patient would discuss with Dr.Silva and make sure she mails RX to her.

## 2017-09-03 NOTE — Telephone Encounter (Signed)
-----   Message from Nunzio Cobbs, MD sent at 08/28/2017  3:19 PM EST ----- Please inform patient of her labs including a normal PTH and low vit D.  I am recommending she take vit D 50,000 IU every other week for 3 months.  She will need a lab visit to recheck this.  I have placed a future order.

## 2017-09-06 DIAGNOSIS — N3020 Other chronic cystitis without hematuria: Secondary | ICD-10-CM | POA: Diagnosis not present

## 2017-09-06 NOTE — Telephone Encounter (Signed)
Opened in error

## 2017-09-07 NOTE — Telephone Encounter (Signed)
I would recommend vaginal estradiol cream 0.02%, 1/2 gram per vagina twice weekly.  Dispense:  8 gram tube, RF ok until annual exam is due.   If her compounding pharmacy is unable to do this, then she can get it at [facility].   Cc - Marisa Sprinkles

## 2017-09-08 MED ORDER — NONFORMULARY OR COMPOUNDED ITEM: 4 refills | Status: DC

## 2017-09-08 NOTE — Telephone Encounter (Signed)
Rx written and to Dr.Silva for review before signing.

## 2017-09-08 NOTE — Telephone Encounter (Signed)
Mailed Rx to patient for estradiol cream 0.02%, 1/2gram per vagina twice weekly. Dispense: 8gm tube with 4 refills(until AEX).

## 2017-09-13 ENCOUNTER — Telehealth: Payer: Self-pay | Admitting: Obstetrics and Gynecology

## 2017-09-13 NOTE — Telephone Encounter (Signed)
Patient called [facility] and they do not have a prescription for the compounded estrogen creme.

## 2017-09-13 NOTE — Telephone Encounter (Signed)
Spoke with patient. Advised rx was mailed to her home address on file on 09/08/2017 by Marisa Sprinkles, [place] per her request. Patient verbalizes understanding. Will contact the office if [facility] cannot compound this rx.  Routing to provider for final review. Patient agreeable to disposition. Will close encounter.

## 2017-09-21 ENCOUNTER — Other Ambulatory Visit: Payer: Self-pay | Admitting: Family Medicine

## 2017-09-21 NOTE — Telephone Encounter (Signed)
Last filled:  07/17/17, #40 Last OV:  08/05/17 Next OV:  10/15/17

## 2017-09-22 NOTE — Telephone Encounter (Signed)
Sent electronically 

## 2017-10-15 ENCOUNTER — Ambulatory Visit: Payer: PPO | Admitting: Family Medicine

## 2017-10-15 ENCOUNTER — Encounter: Payer: Self-pay | Admitting: Family Medicine

## 2017-10-15 VITALS — BP 138/80 | HR 66 | Temp 97.9°F | Wt 142.2 lb

## 2017-10-15 DIAGNOSIS — E559 Vitamin D deficiency, unspecified: Secondary | ICD-10-CM | POA: Diagnosis not present

## 2017-10-15 DIAGNOSIS — F4321 Adjustment disorder with depressed mood: Secondary | ICD-10-CM

## 2017-10-15 DIAGNOSIS — I10 Essential (primary) hypertension: Secondary | ICD-10-CM | POA: Diagnosis not present

## 2017-10-15 DIAGNOSIS — R222 Localized swelling, mass and lump, trunk: Secondary | ICD-10-CM | POA: Insufficient documentation

## 2017-10-15 MED ORDER — TRIAMCINOLONE ACETONIDE 0.1 % EX CREA: 1.0000 "application " | TOPICAL_CREAM | Freq: Two times a day (BID) | CUTANEOUS | 0 refills | Status: DC

## 2017-10-15 NOTE — Progress Notes (Signed)
BP 138/80 (BP Location: Left Arm, Patient Position: Sitting, Cuff Size: Normal)   Pulse 66   Temp 97.9 F (36.6 C) (Oral)   Wt 142 lb 4 oz (64.5 kg)   SpO2 96%   BMI 24.42 kg/m    CC: 6 mo f/u visit Subjective:    Patient ID: Bianca Fry, female    DOB: 08/04/1946, 71 y.o.   MRN: [id number]  HPI: Bianca Fry is a 71 y.o. female presenting on 10/15/2017 for 6 mo follow-up   Osteopenia - on calcium and vitamin D. This is followed by GYN. Started on vitamin D 50,000 units every other week. Rpt labs planned 11/2017.   Last visit we trialed xanax for ongoing anxiety. She had intolerances to other benzos including valium, ativan, and klonopin. She takes this mainly at night.   Occasional bumps at lumbar surgery incision that come and go. Has not tried any creams for this. Hasn't found trigger for this. No new lotions, detergents or soaps.   Relevant past medical, surgical, family and social history reviewed and updated as indicated. Interim medical history since our last visit reviewed. Allergies and medications reviewed and updated. Outpatient Medications Prior to Visit  Medication Sig Dispense Refill  . ALPRAZolam (XANAX) 0.5 MG tablet TAKE 1 TABLET BY MOUTH AT BEDTIME AS NEEDED FOR ANXIETY 40 tablet 0  . aspirin EC 81 MG EC tablet Take 1 tablet (81 mg total) by mouth daily. 30 tablet 2  . atenolol (TENORMIN) 25 MG tablet TAKE ONE TABLET BY MOUTH ONCE DAILY 90 tablet 3  . atorvastatin (LIPITOR) 20 MG tablet TAKE 1 TABLET BY MOUTH TWICE A WEEK ON MONDAY AND FRIDAY 24 tablet 2  . Calcium Carb-Cholecalciferol (CALCIUM-VITAMIN D) 600-400 MG-UNIT TABS Take 1 tablet by mouth daily.    Marland Kitchen dicyclomine (BENTYL) 10 MG capsule TAKE ONE CAPSULE BY MOUTH TWICE DAILY AS NEEDED FOR SPASMS. 30 capsule 3  . estradiol (ESTRACE) 0.1 MG/GM vaginal cream Use 1/2 g vaginally every night for the first 2 weeks, then use 1/2 g vaginally two to three times a week. 42.5 g 2  . fexofenadine (ALLEGRA) 180 MG  tablet Take 180 mg by mouth daily as needed for allergies.     Marland Kitchen GARLIC PO Take 1 capsule by mouth daily.    . NONFORMULARY OR COMPOUNDED ITEM Vaginal estradiol cream 0.02%, 1/2 gram per vagina twice weekly.  Dispense:  8 gram tube, RF 1 each 4  . Omega-3 Fatty Acids (FISH OIL) 1000 MG CAPS Take 1,000 mg by mouth daily.    Marland Kitchen omeprazole (PRILOSEC) 40 MG capsule TAKE ONE CAPSULE BY MOUTH ONCE DAILY 90 capsule 3  . ondansetron (ZOFRAN) 8 MG tablet TAKE ONE TABLET BY MOUTH EVERY 8 HOURS AS NEEDED FOR NAUSEA AND VOMITING 90 tablet 1  . ranitidine (ZANTAC) 150 MG tablet Take 1 tablet (150 mg total) by mouth at bedtime.    . Vitamin D, Ergocalciferol, (DRISDOL) 50000 units CAPS capsule Take 1 capsule every other week 6 capsule 0   No facility-administered medications prior to visit.      Per HPI unless specifically indicated in ROS section below Review of Systems     Objective:    BP 138/80 (BP Location: Left Arm, Patient Position: Sitting, Cuff Size: Normal)   Pulse 66   Temp 97.9 F (36.6 C) (Oral)   Wt 142 lb 4 oz (64.5 kg)   SpO2 96%   BMI 24.42 kg/m   Wt Readings from Last  3 Encounters:  10/15/17 142 lb 4 oz (64.5 kg)  08/27/17 143 lb (64.9 kg)  08/05/17 142 lb 4 oz (64.5 kg)    Physical Exam  Constitutional: She appears well-developed and well-nourished. No distress.  HENT:  Mouth/Throat: Oropharynx is clear and moist. No oropharyngeal exudate.  Cardiovascular: Normal rate, regular rhythm, normal heart sounds and intact distal pulses.  No murmur heard. Pulmonary/Chest: Effort normal and breath sounds normal. No respiratory distress. She has no wheezes. She has no rales.  Musculoskeletal: She exhibits no edema.  Skin: Skin is warm and dry. No rash noted. No erythema.  Small flesh colored growth ~66mm diameter midline lumbar spine just above incision with mild scaling and redness  Nursing note and vitals reviewed.  Results for orders placed or performed in visit on 08/27/17    PTH, intact (no Ca)  Result Value Ref Range   PTH 29 15 - 65 pg/mL  VITAMIN D 25 Hydroxy (Vit-D Deficiency, Fractures)  Result Value Ref Range   Vit D, 25-Hydroxy 22.3 (L) 30.0 - 100.0 ng/mL      Assessment & Plan:   Problem List Items Addressed This Visit    Adjustment disorder with depressed mood    Tolerating xanax PRN. Continue.       Essential hypertension - Primary    Chronic, stable. Continue current regimen.       Lump of skin of back    Anticipate flesh colored benign mole that is irritated. No pigmentation. Rx triamcinolone cream PRN update if not improving. She has derm appt mid 2019      Vitamin D deficiency    Started on 50k U replacement by GYN.           Follow up plan: Return in about 6 months (around 04/15/2018), or if symptoms worsen or fail to improve, for medicare wellness visit, annual exam, prior fasting for blood work.  Ria Bush, MD

## 2017-10-15 NOTE — Patient Instructions (Signed)
Try steroid cream for what seems like irritated mole on back.  You are doing well today Return as needed or in 6 months for physical.

## 2017-10-15 NOTE — Assessment & Plan Note (Signed)
Tolerating xanax PRN. Continue.

## 2017-10-15 NOTE — Assessment & Plan Note (Addendum)
Anticipate flesh colored benign mole that is irritated. No pigmentation. Rx triamcinolone cream PRN update if not improving. She has derm appt mid 2019

## 2017-10-15 NOTE — Assessment & Plan Note (Signed)
Started on 50k U replacement by GYN.

## 2017-10-15 NOTE — Assessment & Plan Note (Signed)
Chronic, stable. Continue current regimen. 

## 2017-11-12 ENCOUNTER — Other Ambulatory Visit: Payer: Self-pay | Admitting: Family Medicine

## 2017-11-12 NOTE — Telephone Encounter (Signed)
Called in refill to [facility] per Dr. Darnell Level.

## 2017-11-12 NOTE — Telephone Encounter (Signed)
Last filled:  09/22/17, #40 Last OV:  10/15/17 Next OV:  04/22/18

## 2017-11-12 NOTE — Telephone Encounter (Signed)
plz phone in. 

## 2017-11-15 ENCOUNTER — Telehealth: Payer: Self-pay | Admitting: Obstetrics and Gynecology

## 2017-11-15 ENCOUNTER — Ambulatory Visit (INDEPENDENT_AMBULATORY_CARE_PROVIDER_SITE_OTHER): Payer: PPO | Admitting: Obstetrics and Gynecology

## 2017-11-15 ENCOUNTER — Encounter: Payer: Self-pay | Admitting: Obstetrics and Gynecology

## 2017-11-15 VITALS — BP 138/80 | HR 66 | Temp 97.8°F | Ht 64.0 in | Wt 145.0 lb

## 2017-11-15 DIAGNOSIS — R3989 Other symptoms and signs involving the genitourinary system: Secondary | ICD-10-CM

## 2017-11-15 DIAGNOSIS — N760 Acute vaginitis: Secondary | ICD-10-CM

## 2017-11-15 MED ORDER — FLUCONAZOLE 150 MG PO TABS: 150.0000 mg | ORAL_TABLET | Freq: Once | ORAL | 0 refills | Status: AC

## 2017-11-15 MED ORDER — CIPROFLOXACIN HCL 500 MG PO TABS: 500.0000 mg | ORAL_TABLET | Freq: Two times a day (BID) | ORAL | 0 refills | Status: DC
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
## 2017-11-15 NOTE — Telephone Encounter (Signed)
Reviewed with Dr. Quincy Simmonds and call returned to patient. OV scheduled for today at 12pm. Patient aware will be worked into schedule. Patient verbalizes understanding and is agreeable.   Routing to provider for final review. Patient is agreeable to disposition. Will close encounter.

## 2017-11-15 NOTE — Telephone Encounter (Signed)
Spoke with patient. Reports "internal" vaginal burning for the last 2 wks. Used OTC monistat on 1/27 experieinced spotting after use, spotting has resolved. Urinary frequency on 1/25, resolved. Nausea for the last few days.   Denies any other GYN symptoms or pain.Marland Kitchen   Hx of hysterectomy.   Has Rx for compounded vaginal cream, used this prior to monistat, experienced same vaginal burning.    Recommended OV for further evaluation. Advised will review scheduling with nursing supervisor and return call. Patient is agreeable.

## 2017-11-15 NOTE — Addendum Note (Signed)
Addended by: Terence Lux A on: 11/15/2017 02:06 PM   Modules accepted: Orders

## 2017-11-15 NOTE — Patient Instructions (Signed)
Vaginitis Vaginitis is a condition in which the vaginal tissue swells and becomes red (inflamed). This condition is most often caused by a change in the normal balance of bacteria and yeast that live in the vagina. This change causes an overgrowth of certain bacteria or yeast, which causes the inflammation. There are different types of vaginitis, but the most common types are:  Bacterial vaginosis.  Yeast infection (candidiasis).  Trichomoniasis vaginitis. This is a sexually transmitted disease (STD).  Viral vaginitis.  Atrophic vaginitis.  Allergic vaginitis.  What are the causes? The cause of this condition depends on the type of vaginitis. It can be caused by:  Bacteria (bacterial vaginosis).  Yeast, which is a fungus (yeast infection).  A parasite (trichomoniasis vaginitis).  A virus (viral vaginitis).  Low hormone levels (atrophic vaginitis). Low hormone levels can occur during pregnancy, breastfeeding, or after menopause.  Irritants, such as bubble baths, scented tampons, and feminine sprays (allergic vaginitis).  Other factors can change the normal balance of the yeast and bacteria that live in the vagina. These include:  Antibiotic medicines.  Poor hygiene.  Diaphragms, vaginal sponges, spermicides, birth control pills, and intrauterine devices (IUD).  Sex.  Infection.  Uncontrolled diabetes.  A weakened defense (immune) system.  What increases the risk? This condition is more likely to develop in women who:  Smoke.  Use vaginal douches, scented tampons, or scented sanitary pads.  Wear tight-fitting pants.  Wear thong underwear.  Use oral birth control pills or an IUD.  Have sex without a condom.  Have multiple sex partners.  Have an STD.  Frequently use the spermicide nonoxynol-9.  Eat lots of foods high in sugar.  Have uncontrolled diabetes.  Have low estrogen levels.  Have a weakened immune system from an immune disorder or medical  treatment.  Are pregnant or breastfeeding.  What are the signs or symptoms? Symptoms vary depending on the cause of the vaginitis. Common symptoms include:  Abnormal vaginal discharge. ? The discharge is white, gray, or yellow with bacterial vaginosis. ? The discharge is thick, white, and cheesy with a yeast infection. ? The discharge is frothy and yellow or greenish with trichomoniasis.  A bad vaginal smell. The smell is fishy with bacterial vaginosis.  Vaginal itching, pain, or swelling.  Sex that is painful.  Pain or burning when urinating.  Sometimes there are no symptoms. How is this diagnosed? This condition is diagnosed based on your symptoms and medical history. A physical exam, including a pelvic exam, will also be done. You may also have other tests, including:  Tests to determine the pH level (acidity or alkalinity) of your vagina.  A whiff test, to assess the odor that results when a sample of your vaginal discharge is mixed with a potassium hydroxide solution.  Tests of vaginal fluid. A sample will be examined under a microscope.  How is this treated? Treatment varies depending on the type of vaginitis you have. Your treatment may include:  Antibiotic creams or pills to treat bacterial vaginosis and trichomoniasis.  Antifungal medicines, such as vaginal creams or suppositories, to treat a yeast infection.  Medicine to ease discomfort if you have viral vaginitis. Your sexual partner should also be treated.  Estrogen delivered in a cream, pill, suppository, or vaginal ring to treat atrophic vaginitis. If vaginal dryness occurs, lubricants and moisturizing creams may help. You may need to avoid scented soaps, sprays, or douches.  Stopping use of a product that is causing allergic vaginitis. Then using a vaginal  cream to treat the symptoms.  Follow these instructions at home: Lifestyle  Keep your genital area clean and dry. Avoid soap, and only rinse the area  with water.  Do not douche or use tampons until your health care provider says it is okay to do so. Use sanitary pads, if needed.  Do not have sex until your health care provider approves. When you can return to sex, practice safe sex and use condoms.  Wipe from front to back. This avoids the spread of bacteria from the rectum to the vagina. General instructions  Take over-the-counter and prescription medicines only as told by your health care provider.  If you were prescribed an antibiotic medicine, take or use it as told by your health care provider. Do not stop taking or using the antibiotic even if you start to feel better.  Keep all follow-up visits as told by your health care provider. This is important. How is this prevented?  Use mild, non-scented products. Do not use things that can irritate the vagina, such as fabric softeners. Avoid the following products if they are scented: ? Feminine sprays. ? Detergents. ? Tampons. ? Feminine hygiene products. ? Soaps or bubble baths.  Let air reach your genital area. ? Wear cotton underwear to reduce moisture buildup. ? Avoid wearing underwear while you sleep. ? Avoid wearing tight pants and underwear or nylons without a cotton panel. ? Avoid wearing thong underwear.  Take off any wet clothing, such as bathing suits, as soon as possible.  Practice safe sex and use condoms. Contact a health care provider if:  You have abdominal pain.  You have a fever.  You have symptoms that last for more than 2-3 days. Get help right away if:  You have a fever and your symptoms suddenly get worse. Summary  Vaginitis is a condition in which the vaginal tissue becomes inflamed.This condition is most often caused by a change in the normal balance of bacteria and yeast that live in the vagina.  Treatment varies depending on the type of vaginitis you have.  Do not douche, use tampons , or have sex until your health care provider approves.  When you can return to sex, practice safe sex and use condoms. This information is not intended to replace advice given to you by your health care provider. Make sure you discuss any questions you have with your health care provider. Document Released: 08/02/2007 Document Revised: 11/10/2016 Document Reviewed: 11/10/2016 Elsevier Interactive Patient Education  2018 [employer]. Urinary Tract Infection, Adult A urinary tract infection (UTI) is an infection of any part of the urinary tract. The urinary tract includes the:  Kidneys.  Ureters.  Bladder.  Urethra.  These organs make, store, and get rid of pee (urine) in the body. Follow these instructions at home:  Take over-the-counter and prescription medicines only as told by your doctor.  If you were prescribed an antibiotic medicine, take it as told by your doctor. Do not stop taking the antibiotic even if you start to feel better.  Avoid the following drinks: ? Alcohol. ? Caffeine. ? Tea. ? Carbonated drinks.  Drink enough fluid to keep your pee clear or pale yellow.  Keep all follow-up visits as told by your doctor. This is important.  Make sure to: ? Empty your bladder often and completely. Do not to hold pee for long periods of time. ? Empty your bladder before and after sex. ? Wipe from front to back after a bowel movement if  you are female. Use each tissue one time when you wipe. Contact a doctor if:  You have back pain.  You have a fever.  You feel sick to your stomach (nauseous).  You throw up (vomit).  Your symptoms do not get better after 3 days.  Your symptoms go away and then come back. Get help right away if:  You have very bad back pain.  You have very bad lower belly (abdominal) pain.  You are throwing up and cannot keep down any medicines or water. This information is not intended to replace advice given to you by your health care provider. Make sure you discuss any questions you have with  your health care provider. Document Released: 03/23/2008 Document Revised: 03/12/2016 Document Reviewed: 08/26/2015 Elsevier Interactive Patient Education  [employer].

## 2017-11-15 NOTE — Telephone Encounter (Signed)
Patient is asking to talk with a nurse regarding some irritation that she has been having. Patient is not sure if she needs to see her PCP or Dr.Silva for this problem? Patient also states that she saw blood on her pad.

## 2017-11-16 LAB — URINALYSIS, MICROSCOPIC ONLY: CASTS: NONE SEEN /LPF

## 2017-11-16 LAB — VAGINITIS/VAGINOSIS, DNA PROBE
Candida Species: NEGATIVE
Gardnerella vaginalis: NEGATIVE
Trichomonas vaginosis: NEGATIVE

## 2017-11-17 LAB — URINE CULTURE

## 2017-11-18 ENCOUNTER — Telehealth: Payer: Self-pay | Admitting: Obstetrics and Gynecology

## 2017-11-18 ENCOUNTER — Telehealth: Payer: Self-pay

## 2017-11-18 MED ORDER — VANCOMYCIN HCL 125 MG PO CAPS: 125.0000 mg | ORAL_CAPSULE | Freq: Four times a day (QID) | ORAL | 0 refills | Status: DC

## 2017-11-18 NOTE — Telephone Encounter (Signed)
Spoke with patient and notified of urine culture results. Advised patient to stop Cipro and will send in Vancomycin to pharmacy on file([facility]). She will call if symptoms worsen.

## 2017-11-18 NOTE — Telephone Encounter (Signed)
Spoke with pharmacist at [facility] who states that Vancomycin 250 mg #8 tablets will be the same cost. Call to the patient who would like additional testing added to urine culture.  Spoke with Myriam Jacobson in the lab who called to have testing added. Per micro department these cannot be added to this patient's culture.  Spoke with patient who states insurance does not cover alternatives and she will pick up Vancomycin rx as written to begin taking. Advise additional testing could not be added to her urine culture per lab.  Routing to provider for final review. Patient agreeable to disposition. Will close encounter.

## 2017-11-18 NOTE — Telephone Encounter (Signed)
-----   Message from Nunzio Cobbs, MD sent at 11/18/2017  9:06 AM EST ----- Please contact patient with UC results showing enterococcus resistant to cipro. I am recommending vancomycin 125 mg op q 6 hours x 4 days.  Please have her stop the cipro and start vancomycin.  Call for worsening symptoms.

## 2017-11-18 NOTE — Telephone Encounter (Signed)
The patient has several allergies to medications and we currently do not have another choice beyond the vancomycin.  Please see if it is less expensive also for vancomycin 250 mg instead.  Please also contact the lab to have additional sensitivity testing done on the urine for fosfomycin and keflex (ancef).  Keflex may be an option but the patient may have a 10% chance of allergic reaction due to her hx of rash with PCN. If she has had anaphylaxis with PCN, she is not a candidate for potential keflex.

## 2017-11-18 NOTE — Telephone Encounter (Signed)
Please see other phone note dated with today's date. Spoke with patient who states that she spoke with [facility] about RX for Vancomycin and this will be $90. Patient cannot afford this medication and is asking if there is any other medication she could use.  Notes recorded by Lowella Fairy, CMA on 11/18/2017 at 10:06 AM EST Patient notified--see phone note. ------  Notes recorded by Nunzio Cobbs, MD on 11/18/2017 at 9:06 AM EST Please contact patient with UC results showing enterococcus resistant to cipro. I am recommending vancomycin 125 mg op q 6 hours x 4 days.  Please have her stop the cipro and start vancomycin.  Call for worsening symptoms.

## 2017-11-18 NOTE — Telephone Encounter (Signed)
Patient spoke with Estill Bamberg earlier today and is asking to talk with her again. Telephone note has been closed.

## 2017-11-22 ENCOUNTER — Other Ambulatory Visit (INDEPENDENT_AMBULATORY_CARE_PROVIDER_SITE_OTHER): Payer: PPO

## 2017-11-22 DIAGNOSIS — R7989 Other specified abnormal findings of blood chemistry: Secondary | ICD-10-CM | POA: Diagnosis not present

## 2017-11-23 LAB — VITAMIN D 25 HYDROXY (VIT D DEFICIENCY, FRACTURES): VIT D 25 HYDROXY: 32.5 ng/mL (ref 30.0–100.0)

## 2017-12-20 DIAGNOSIS — R3121 Asymptomatic microscopic hematuria: Secondary | ICD-10-CM | POA: Diagnosis not present

## 2017-12-20 DIAGNOSIS — N3000 Acute cystitis without hematuria: Secondary | ICD-10-CM | POA: Diagnosis not present

## 2018-01-05 DIAGNOSIS — N390 Urinary tract infection, site not specified: Secondary | ICD-10-CM | POA: Diagnosis not present

## 2018-01-17 ENCOUNTER — Other Ambulatory Visit: Payer: Self-pay | Admitting: Family Medicine

## 2018-01-17 NOTE — Telephone Encounter (Signed)
Last filled:  09/22/17, #40 Last OV:  10/15/17 Next OV (CPE):  04/22/18

## 2018-01-20 NOTE — Telephone Encounter (Signed)
Eprescribed.

## 2018-01-21 ENCOUNTER — Encounter: Payer: Self-pay | Admitting: Obstetrics and Gynecology

## 2018-01-21 ENCOUNTER — Ambulatory Visit (INDEPENDENT_AMBULATORY_CARE_PROVIDER_SITE_OTHER): Payer: PPO | Admitting: Obstetrics and Gynecology

## 2018-01-21 ENCOUNTER — Other Ambulatory Visit: Payer: Self-pay

## 2018-01-21 VITALS — BP 128/70 | HR 60 | Resp 16 | Ht 64.0 in | Wt 143.0 lb

## 2018-01-21 DIAGNOSIS — R350 Frequency of micturition: Secondary | ICD-10-CM | POA: Diagnosis not present

## 2018-01-21 DIAGNOSIS — R7989 Other specified abnormal findings of blood chemistry: Secondary | ICD-10-CM | POA: Diagnosis not present

## 2018-01-21 DIAGNOSIS — Z01419 Encounter for gynecological examination (general) (routine) without abnormal findings: Secondary | ICD-10-CM

## 2018-01-21 DIAGNOSIS — N766 Ulceration of vulva: Secondary | ICD-10-CM | POA: Diagnosis not present

## 2018-01-21 DIAGNOSIS — M8580 Other specified disorders of bone density and structure, unspecified site: Secondary | ICD-10-CM

## 2018-01-21 LAB — POCT URINALYSIS DIPSTICK
BILIRUBIN UA: NEGATIVE
GLUCOSE UA: NEGATIVE
Ketones, UA: NEGATIVE
Nitrite, UA: NEGATIVE
Protein, UA: NEGATIVE
RBC UA: 1
Urobilinogen, UA: 0.2 E.U./dL
pH, UA: 6 (ref 5.0–8.0)

## 2018-01-21 MED ORDER — ESTRADIOL 0.1 MG/GM VA CREA: TOPICAL_CREAM | VAGINAL | 1 refills | Status: DC

## 2018-01-21 MED ORDER — RISEDRONATE SODIUM 150 MG PO TABS: 150.0000 mg | ORAL_TABLET | ORAL | 0 refills | Status: DC
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
## 2018-01-21 NOTE — Patient Instructions (Addendum)
EXERCISE AND DIET:  We recommended that you start or continue a regular exercise program for good health. Regular exercise means any activity that makes your heart beat faster and makes you sweat.  We recommend exercising at least 30 minutes per day at least 3 days a week, preferably 4 or 5.  We also recommend a diet low in fat and sugar.  Inactivity, poor dietary choices and obesity can cause diabetes, heart attack, stroke, and kidney damage, among others.    ALCOHOL AND SMOKING:  Women should limit their alcohol intake to no more than 7 drinks/beers/glasses of wine (combined, not each!) per week. Moderation of alcohol intake to this level decreases your risk of breast cancer and liver damage. And of course, no recreational drugs are part of a healthy lifestyle.  And absolutely no smoking or even second hand smoke. Most people know smoking can cause heart and lung diseases, but did you know it also contributes to weakening of your bones? Aging of your skin?  Yellowing of your teeth and nails?  CALCIUM AND VITAMIN D:  Adequate intake of calcium and Vitamin D are recommended.  The recommendations for exact amounts of these supplements seem to change often, but generally speaking 600 mg of calcium (either carbonate or citrate) and 800 units of Vitamin D per day seems prudent. Certain women may benefit from higher intake of Vitamin D.  If you are among these women, your doctor will have told you during your visit.    PAP SMEARS:  Pap smears, to check for cervical cancer or precancers,  have traditionally been done yearly, although recent scientific advances have shown that most women can have pap smears less often.  However, every woman still should have a physical exam from her gynecologist every year. It will include a breast check, inspection of the vulva and vagina to check for abnormal growths or skin changes, a visual exam of the cervix, and then an exam to evaluate the size and shape of the uterus and  ovaries.  And after 73 years of age, a rectal exam is indicated to check for rectal cancers. We will also provide age appropriate advice regarding health maintenance, like when you should have certain vaccines, screening for sexually transmitted diseases, bone density testing, colonoscopy, mammograms, etc.   MAMMOGRAMS:  All women over 40 years old should have a yearly mammogram. Many facilities now offer a "3D" mammogram, which may cost around $50 extra out of pocket. If possible,  we recommend you accept the option to have the 3D mammogram performed.  It both reduces the number of women who will be called back for extra views which then turn out to be normal, and it is better than the routine mammogram at detecting truly abnormal areas.    COLONOSCOPY:  Colonoscopy to screen for colon cancer is recommended for all women at age 50.  We know, you hate the idea of the prep.  We agree, BUT, having colon cancer and not knowing it is worse!!  Colon cancer so often starts as a polyp that can be seen and removed at colonscopy, which can quite literally save your life!  And if your first colonoscopy is normal and you have no family history of colon cancer, most women don't have to have it again for 10 years.  Once every ten years, you can do something that may end up saving your life, right?  We will be happy to help you get it scheduled when you are ready.    Be sure to check your insurance coverage so you understand how much it will cost.  It may be covered as a preventative service at no cost, but you should check your particular policy.     Risedronate tablets What is this medicine? RISEDRONATE (ris ED roe nate) reduces calcium loss from bones. It helps make healthy bone and to slow bone loss in patients with Paget's disease and osteoporosis. It may be used in others at risk for bone loss. This medicine may be used for other purposes; ask your health care provider or pharmacist if you have questions. COMMON  BRAND NAME(S): Actonel What should I tell my health care provider before I take this medicine? They need to know if you have any of these conditions: -dental disease -esophagus, stomach, or intestine problems, like acid reflux or GERD -kidney disease -low blood calcium -problems sitting or standing for 30 minutes -trouble swallowing -an unusual or allergic reaction to risedronate, other medicines, foods, dyes, or preservatives -pregnant or trying to get pregnant -breast-feeding How should I use this medicine? You must take this medication exactly as directed or you will lower the amount of medicine you absorb into your body or you may cause your self harm. Take this medicine by mouth first thing in the morning, after you are up for the day. Do not eat or drink anything before you take this medicine. Swallow the tablets with a full glass (6 to 8 fluid ounces) of plain water. Do not take the tablets with any other drink. Do not chew or crush the tablet. After taking this medicine, do not eat breakfast, drink, or take any other medicines or vitamins for at least 30 minutes. Stand or sit up for at least 30 minutes after you take this medicine; do not lie down. Do not take your medicine more often than directed. Talk to your pediatrician regarding the use of this medicine in children. Special care may be needed. Overdosage: If you think you have taken too much of this medicine contact a poison control center or emergency room at once. NOTE: This medicine is only for you. Do not share this medicine with others. What if I miss a dose? If you miss a dose, do not take it later in the day. Take your normal dose the next morning. Do not take double or extra doses. What may interact with this medicine? -antacids like aluminum hydroxide or magnesium hydroxide -aspirin -calcium supplements -iron supplements -NSAIDs, medicines for pain and inflammation, like ibuprofen or naproxen -thyroid  hormones -vitamins with minerals This list may not describe all possible interactions. Give your health care provider a list of all the medicines, herbs, non-prescription drugs, or dietary supplements you use. Also tell them if you smoke, drink alcohol, or use illegal drugs. Some items may interact with your medicine. What should I watch for while using this medicine? Visit your doctor or health care professional for regular check ups. It may be some time before you see the benefit from this medicine. Your doctor or health care professional may order blood tests and other tests to see how you are doing. You should make sure you get enough calcium and vitamin D while you are taking this medicine, unless your doctor tells you not to. Discuss the foods you eat and the vitamins you take with your health care professional. Some people who take this medicine have severe bone, joint, and/or muscle pain. This medicine may also increase your risk for a broken thigh bone. Tell  your doctor right away if you have pain in your upper leg or groin. Tell your doctor if you have any pain that does not go away or that gets worse. What side effects may I notice from receiving this medicine? Side effects that you should report to your doctor or health care professional as soon as possible: -allergic reactions such as skin rash or itching, hives, swelling of the face, lips, throat, or tongue -black or tarry stools -changes in vision -heartburn or stomach pain -jaw pain, especially after dental work -pain or difficulty when swallowing -redness, blistering, peeling, or loosening of the skin, including inside the mouth Side effects that usually do not require medical attention (report to your doctor or health care professional if they continue or are bothersome): -bone, muscle, or joint pain -changes in taste -diarrhea or constipation -eye pain or itching -headache -nausea or vomiting -stomach gas or fullness This  list may not describe all possible side effects. Call your doctor for medical advice about side effects. You may report side effects to FDA at 1-800-FDA-1088. Where should I keep my medicine? Keep out of the reach of children. Store at room temperature between 20 and 25 degrees C (68 and 77 degrees F). Throw away any unused medicine after the expiration date. NOTE: This sheet is a summary. It may not cover all possible information. If you have questions about this medicine, talk to your doctor, pharmacist, or health care provider.  2018 Elsevier/Gold Standard (2015-11-07 09:18:09)

## 2018-01-22 LAB — URINALYSIS, MICROSCOPIC ONLY
BACTERIA UA: NONE SEEN
CASTS: NONE SEEN /LPF

## 2018-01-22 LAB — HSV(HERPES SIMPLEX VRS) I + II AB-IGG
HSV 1 Glycoprotein G Ab, IgG: 30.1 index — ABNORMAL HIGH (ref 0.00–0.90)
HSV 2 IGG, TYPE SPEC: 16.2 {index} — AB (ref 0.00–0.90)

## 2018-01-22 LAB — VITAMIN D 25 HYDROXY (VIT D DEFICIENCY, FRACTURES): Vit D, 25-Hydroxy: 36.2 ng/mL (ref 30.0–100.0)

## 2018-01-23 ENCOUNTER — Encounter: Payer: Self-pay | Admitting: Obstetrics and Gynecology

## 2018-01-23 LAB — HSV NAA
HSV 1 NAA: NEGATIVE
HSV 2 NAA: POSITIVE — AB

## 2018-01-25 ENCOUNTER — Telehealth: Payer: Self-pay

## 2018-01-25 LAB — URINE CULTURE

## 2018-01-25 MED ORDER — CEPHALEXIN 250 MG PO CAPS: 250.0000 mg | ORAL_CAPSULE | Freq: Two times a day (BID) | ORAL | 0 refills | Status: DC

## 2018-01-25 NOTE — Telephone Encounter (Signed)
Spoke with patient. Advised of results a seen below from [place]. Patient verbalizes understanding. Keflex 250 mg po BID x 5 days #10 0RF sent to pharmacy on file. Patient is agreeable with seeing urology directly for UTI's in the future. Encounter closed.

## 2018-01-25 NOTE — Telephone Encounter (Signed)
-----   Message from Nunzio Cobbs, MD sent at 01/25/2018  2:55 PM EDT ----- Please report final UC result to patient.  She still has a small amount of enterococcus in the urine.  I am recommending she take Keflex 250 mg po bid x 5 days.  We talked about her seeing urology directly for future UTIs due to her history.

## 2018-02-16 DIAGNOSIS — R8279 Other abnormal findings on microbiological examination of urine: Secondary | ICD-10-CM | POA: Diagnosis not present

## 2018-02-16 DIAGNOSIS — N3020 Other chronic cystitis without hematuria: Secondary | ICD-10-CM | POA: Diagnosis not present

## 2018-02-17 DIAGNOSIS — N3020 Other chronic cystitis without hematuria: Secondary | ICD-10-CM | POA: Diagnosis not present

## 2018-02-18 DIAGNOSIS — N3000 Acute cystitis without hematuria: Secondary | ICD-10-CM | POA: Diagnosis not present

## 2018-02-18 DIAGNOSIS — N3020 Other chronic cystitis without hematuria: Secondary | ICD-10-CM | POA: Diagnosis not present

## 2018-03-02 DIAGNOSIS — H9209 Otalgia, unspecified ear: Secondary | ICD-10-CM | POA: Diagnosis not present

## 2018-03-02 DIAGNOSIS — R070 Pain in throat: Secondary | ICD-10-CM | POA: Diagnosis not present

## 2018-03-02 DIAGNOSIS — H6123 Impacted cerumen, bilateral: Secondary | ICD-10-CM | POA: Diagnosis not present

## 2018-03-02 DIAGNOSIS — J309 Allergic rhinitis, unspecified: Secondary | ICD-10-CM | POA: Diagnosis not present

## 2018-03-17 ENCOUNTER — Other Ambulatory Visit: Payer: Self-pay | Admitting: Family Medicine

## 2018-03-18 NOTE — Telephone Encounter (Signed)
Last filled:  01/20/18, #40 Last OV:  10/15/17 Next OV (CPE):  04/22/18

## 2018-03-21 NOTE — Telephone Encounter (Signed)
Eprescribed.

## 2018-04-05 ENCOUNTER — Encounter: Payer: Self-pay | Admitting: Emergency Medicine

## 2018-04-05 ENCOUNTER — Emergency Department: Payer: PPO

## 2018-04-05 DIAGNOSIS — I2510 Atherosclerotic heart disease of native coronary artery without angina pectoris: Secondary | ICD-10-CM | POA: Insufficient documentation

## 2018-04-05 DIAGNOSIS — Z79899 Other long term (current) drug therapy: Secondary | ICD-10-CM | POA: Insufficient documentation

## 2018-04-05 DIAGNOSIS — R51 Headache: Secondary | ICD-10-CM | POA: Insufficient documentation

## 2018-04-05 DIAGNOSIS — I119 Hypertensive heart disease without heart failure: Secondary | ICD-10-CM | POA: Insufficient documentation

## 2018-04-05 DIAGNOSIS — N3000 Acute cystitis without hematuria: Secondary | ICD-10-CM | POA: Insufficient documentation

## 2018-04-05 DIAGNOSIS — Z7982 Long term (current) use of aspirin: Secondary | ICD-10-CM | POA: Diagnosis not present

## 2018-04-05 DIAGNOSIS — I6522 Occlusion and stenosis of left carotid artery: Secondary | ICD-10-CM | POA: Diagnosis not present

## 2018-04-05 LAB — CBC
HCT: 36.5 % (ref 35.0–47.0)
Hemoglobin: 12.5 g/dL (ref 12.0–16.0)
MCH: 28.2 pg (ref 26.0–34.0)
MCHC: 34.2 g/dL (ref 32.0–36.0)
MCV: 82.2 fL (ref 80.0–100.0)
PLATELETS: 179 10*3/uL (ref 150–440)
RBC: 4.44 MIL/uL (ref 3.80–5.20)
RDW: 14.5 % (ref 11.5–14.5)
WBC: 12.7 10*3/uL — ABNORMAL HIGH (ref 3.6–11.0)

## 2018-04-05 LAB — COMPREHENSIVE METABOLIC PANEL
ALT: 18 U/L (ref 14–54)
AST: 24 U/L (ref 15–41)
Albumin: 4.2 g/dL (ref 3.5–5.0)
Alkaline Phosphatase: 101 U/L (ref 38–126)
Anion gap: 12 (ref 5–15)
BUN: 15 mg/dL (ref 6–20)
CHLORIDE: 105 mmol/L (ref 101–111)
CO2: 24 mmol/L (ref 22–32)
CREATININE: 0.85 mg/dL (ref 0.44–1.00)
Calcium: 9.2 mg/dL (ref 8.9–10.3)
GFR calc Af Amer: >60 mL/min (ref >60)
GFR calc non Af Amer: >60 mL/min (ref >60)
Glucose, Bld: 132 mg/dL — ABNORMAL HIGH (ref 65–99)
Potassium: 3.7 mmol/L (ref 3.5–5.1)
Sodium: 141 mmol/L (ref 135–145)
Total Bilirubin: 0.5 mg/dL (ref 0.3–1.2)
Total Protein: 7.8 g/dL (ref 6.5–8.1)

## 2018-04-05 LAB — LIPASE, BLOOD: LIPASE: 32 U/L (ref 11–51)

## 2018-04-05 MED ORDER — ONDANSETRON HCL 4 MG/2ML IJ SOLN
4.0000 mg | Freq: Once | INTRAMUSCULAR | Status: AC | PRN
  Administered 2018-04-05: 4 mg via INTRAVENOUS
  Filled 2018-04-05: qty 2

## 2018-04-05 NOTE — ED Triage Notes (Signed)
Pt c/o sudden onset right sided HA while in shower that has increased since 1800 this evening. Pt having N/V in triage.

## 2018-04-06 ENCOUNTER — Emergency Department: Payer: PPO

## 2018-04-06 ENCOUNTER — Emergency Department
Admission: EM | Admit: 2018-04-06 | Discharge: 2018-04-06 | Disposition: A | Discharge status: Home / Self Care | Payer: PPO | Attending: Emergency Medicine | Admitting: Emergency Medicine

## 2018-04-06 ENCOUNTER — Other Ambulatory Visit: Payer: Self-pay

## 2018-04-06 DIAGNOSIS — R519 Headache, unspecified: Secondary | ICD-10-CM

## 2018-04-06 DIAGNOSIS — N3000 Acute cystitis without hematuria: Secondary | ICD-10-CM

## 2018-04-06 DIAGNOSIS — R51 Headache: Secondary | ICD-10-CM

## 2018-04-06 DIAGNOSIS — I6522 Occlusion and stenosis of left carotid artery: Secondary | ICD-10-CM | POA: Diagnosis not present

## 2018-04-06 LAB — URINALYSIS, COMPLETE (UACMP) WITH MICROSCOPIC
BILIRUBIN URINE: NEGATIVE
Glucose, UA: NEGATIVE mg/dL
Hgb urine dipstick: NEGATIVE
KETONES UR: 5 mg/dL — AB
Nitrite: NEGATIVE
Protein, ur: NEGATIVE mg/dL
Specific Gravity, Urine: 1.014 (ref 1.005–1.030)
pH: 7 (ref 5.0–8.0)

## 2018-04-06 MED ORDER — ONDANSETRON HCL 4 MG/2ML IJ SOLN
4.0000 mg | Freq: Once | INTRAMUSCULAR | Status: AC
  Administered 2018-04-06: 4 mg via INTRAVENOUS
  Filled 2018-04-06: qty 2

## 2018-04-06 MED ORDER — MORPHINE SULFATE (PF) 2 MG/ML IV SOLN
2.0000 mg | Freq: Once | INTRAVENOUS | Status: AC
  Administered 2018-04-06: 2 mg via INTRAVENOUS
  Filled 2018-04-06: qty 1

## 2018-04-06 MED ORDER — IOPAMIDOL (ISOVUE-370) INJECTION 76%
100.0000 mL | Freq: Once | INTRAVENOUS | Status: AC | PRN
  Administered 2018-04-06: 100 mL via INTRAVENOUS

## 2018-04-06 MED ORDER — CEPHALEXIN 500 MG PO CAPS: 500.0000 mg | ORAL_CAPSULE | Freq: Two times a day (BID) | ORAL | 0 refills | Status: AC

## 2018-04-06 MED ORDER — SODIUM CHLORIDE 0.9 % IV SOLN
1.0000 g | Freq: Once | INTRAVENOUS | Status: AC
  Administered 2018-04-06: 1 g via INTRAVENOUS
  Filled 2018-04-06: qty 10

## 2018-04-06 NOTE — ED Notes (Signed)
Pt to the er for headache to the right side of the head that started suddenly when she was taking a shower. Pt also had nausea. Pain has improved but has not subsided. Pt had an episode of vomiting in the lobby. Pt denies any vision changes, or dizziness. Pt has a hx of abdominal issues and is allergic to medications
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
## 2018-04-06 NOTE — ED Notes (Signed)
Patient transported to CT 

## 2018-04-06 NOTE — ED Notes (Signed)
Pt back from CT

## 2018-04-11 ENCOUNTER — Other Ambulatory Visit: Payer: Self-pay | Admitting: Family Medicine

## 2018-04-19 ENCOUNTER — Other Ambulatory Visit: Payer: Self-pay | Admitting: Family Medicine

## 2018-04-19 DIAGNOSIS — E559 Vitamin D deficiency, unspecified: Secondary | ICD-10-CM

## 2018-04-19 DIAGNOSIS — D509 Iron deficiency anemia, unspecified: Secondary | ICD-10-CM

## 2018-04-19 DIAGNOSIS — E785 Hyperlipidemia, unspecified: Secondary | ICD-10-CM

## 2018-04-19 DIAGNOSIS — R7303 Prediabetes: Secondary | ICD-10-CM

## 2018-04-20 ENCOUNTER — Ambulatory Visit: Payer: PPO

## 2018-04-20 ENCOUNTER — Ambulatory Visit (INDEPENDENT_AMBULATORY_CARE_PROVIDER_SITE_OTHER): Payer: PPO

## 2018-04-20 VITALS — BP 124/70 | HR 71 | Temp 97.7°F | Ht 63.75 in | Wt 145.0 lb

## 2018-04-20 DIAGNOSIS — D1801 Hemangioma of skin and subcutaneous tissue: Secondary | ICD-10-CM | POA: Diagnosis not present

## 2018-04-20 DIAGNOSIS — I788 Other diseases of capillaries: Secondary | ICD-10-CM | POA: Diagnosis not present

## 2018-04-20 DIAGNOSIS — Z0000 Encounter for general adult medical examination without abnormal findings: Secondary | ICD-10-CM

## 2018-04-20 DIAGNOSIS — E785 Hyperlipidemia, unspecified: Secondary | ICD-10-CM

## 2018-04-20 DIAGNOSIS — D2271 Melanocytic nevi of right lower limb, including hip: Secondary | ICD-10-CM | POA: Diagnosis not present

## 2018-04-20 DIAGNOSIS — Z85828 Personal history of other malignant neoplasm of skin: Secondary | ICD-10-CM | POA: Diagnosis not present

## 2018-04-20 DIAGNOSIS — L570 Actinic keratosis: Secondary | ICD-10-CM | POA: Diagnosis not present

## 2018-04-20 DIAGNOSIS — L814 Other melanin hyperpigmentation: Secondary | ICD-10-CM | POA: Diagnosis not present

## 2018-04-20 DIAGNOSIS — D509 Iron deficiency anemia, unspecified: Secondary | ICD-10-CM

## 2018-04-20 DIAGNOSIS — R7303 Prediabetes: Secondary | ICD-10-CM

## 2018-04-20 DIAGNOSIS — D225 Melanocytic nevi of trunk: Secondary | ICD-10-CM | POA: Diagnosis not present

## 2018-04-20 DIAGNOSIS — D2272 Melanocytic nevi of left lower limb, including hip: Secondary | ICD-10-CM | POA: Diagnosis not present

## 2018-04-20 DIAGNOSIS — D2262 Melanocytic nevi of left upper limb, including shoulder: Secondary | ICD-10-CM | POA: Diagnosis not present

## 2018-04-20 DIAGNOSIS — L821 Other seborrheic keratosis: Secondary | ICD-10-CM | POA: Diagnosis not present

## 2018-04-20 DIAGNOSIS — L812 Freckles: Secondary | ICD-10-CM | POA: Diagnosis not present

## 2018-04-20 LAB — LIPID PANEL
Cholesterol: 196 mg/dL (ref 0–200)
HDL: 54.2 mg/dL (ref >39.00)
NONHDL: 141.35
Total CHOL/HDL Ratio: 4
Triglycerides: 215 mg/dL — ABNORMAL HIGH (ref 0.0–149.0)
VLDL: 43 mg/dL — AB (ref 0.0–40.0)

## 2018-04-20 LAB — FOLATE: Folate: 19.5 ng/mL (ref >5.9)

## 2018-04-20 LAB — HEMOGLOBIN A1C: HEMOGLOBIN A1C: 6.3 % (ref 4.6–6.5)

## 2018-04-20 LAB — IBC PANEL
IRON: 72 ug/dL (ref 42–145)
SATURATION RATIOS: 19.2 % — AB (ref 20.0–50.0)
Transferrin: 268 mg/dL (ref 212.0–360.0)

## 2018-04-20 LAB — LDL CHOLESTEROL, DIRECT: LDL DIRECT: 103 mg/dL
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
## 2018-04-20 NOTE — Progress Notes (Signed)
PCP notes:   Health maintenance:  No gaps identified.   Abnormal screenings:   None  Patient concerns:   None  Nurse concerns:  None  Next PCP appt:   04/22/18 @ 0830

## 2018-04-20 NOTE — Patient Instructions (Signed)
Ms. Eisenbeis , Thank you for taking time to come for your Medicare Wellness Visit. I appreciate your ongoing commitment to your health goals. Please review the following plan we discussed and let me know if I can assist you in the future.   These are the goals we discussed: Goals    . Increase physical activity     Starting 04/20/2018, I will continue to walk at least 20 minutes daily.        This is a list of the screening recommended for you and due dates:  Health Maintenance  Topic Date Due  . Flu Shot  05/19/2018  . Mammogram  08/17/2018  . DTaP/Tdap/Td vaccine (2 - Td) 04/08/2021  . Tetanus Vaccine  04/08/2021  . Colon Cancer Screening  03/05/2025  . DEXA scan (bone density measurement)  Completed  .  Hepatitis C: One time screening is recommended by Center for Disease Control  (CDC) for  adults born from [birth year] through 1966.   Completed  . Pneumonia vaccines  Completed   Preventive Care for Adults  A healthy lifestyle and preventive care can promote health and wellness. Preventive health guidelines for adults include the following key practices.  . A routine yearly physical is a good way to check with your health care provider about your health and preventive screening. It is a chance to share any concerns and updates on your health and to receive a thorough exam.  . Visit your dentist for a routine exam and preventive care every 6 months. Brush your teeth twice a day and floss once a day. Good oral hygiene prevents tooth decay and gum disease.  . The frequency of eye exams is based on your age, health, family medical history, use  of contact lenses, and other factors. Follow your health care provider's recommendations for frequency of eye exams.  . Eat a healthy diet. Foods like vegetables, fruits, whole grains, low-fat dairy products, and lean protein foods contain the nutrients you need without too many calories. Decrease your intake of foods high in solid fats, added sugars, and  salt. Eat the right amount of calories for you. Get information about a proper diet from your health care provider, if necessary.  . Regular physical exercise is one of the most important things you can do for your health. Most adults should get at least 150 minutes of moderate-intensity exercise (any activity that increases your heart rate and causes you to sweat) each week. In addition, most adults need muscle-strengthening exercises on 2 or more days a week.  Silver Sneakers may be a benefit available to you. To determine eligibility, you may visit the website: www.silversneakers.com or contact program at 854 197 8741 Mon-Fri between 8AM-8PM.   . Maintain a healthy weight. The body mass index (BMI) is a screening tool to identify possible weight problems. It provides an estimate of body fat based on height and weight. Your health care provider can find your BMI and can help you achieve or maintain a healthy weight.   For adults 20 years and older: ? A BMI below 18.5 is considered underweight. ? A BMI of 18.5 to 24.9 is normal. ? A BMI of 25 to 29.9 is considered overweight. ? A BMI of 30 and above is considered obese.   . Maintain normal blood lipids and cholesterol levels by exercising and minimizing your intake of saturated fat. Eat a balanced diet with plenty of fruit and vegetables. Blood tests for lipids and cholesterol should begin at age 58  and be repeated every 5 years. If your lipid or cholesterol levels are high, you are over 50, or you are at high risk for heart disease, you may need your cholesterol levels checked more frequently. Ongoing high lipid and cholesterol levels should be treated with medicines if diet and exercise are not working.  . If you smoke, find out from your health care provider how to quit. If you do not use tobacco, please do not start.  . If you choose to drink alcohol, please do not consume more than 2 drinks per day. One drink is considered to be 12 ounces  (355 mL) of beer, 5 ounces (148 mL) of wine, or 1.5 ounces (44 mL) of liquor.  . If you are 72-69 years old, ask your health care provider if you should take aspirin to prevent strokes.  . Use sunscreen. Apply sunscreen liberally and repeatedly throughout the day. You should seek shade when your shadow is shorter than you. Protect yourself by wearing long sleeves, pants, a wide-brimmed hat, and sunglasses year round, whenever you are outdoors.  . Once a month, do a whole body skin exam, using a mirror to look at the skin on your back. Tell your health care provider of new moles, moles that have irregular borders, moles that are larger than a pencil eraser, or moles that have changed in shape or color.

## 2018-04-22 ENCOUNTER — Ambulatory Visit (INDEPENDENT_AMBULATORY_CARE_PROVIDER_SITE_OTHER): Payer: PPO | Admitting: Family Medicine

## 2018-04-22 ENCOUNTER — Encounter: Payer: Self-pay | Admitting: Family Medicine

## 2018-04-22 ENCOUNTER — Ambulatory Visit: Payer: PPO | Admitting: Obstetrics and Gynecology

## 2018-04-22 VITALS — BP 136/76 | HR 75 | Temp 97.8°F | Ht 63.75 in | Wt 144.5 lb

## 2018-04-22 DIAGNOSIS — K582 Mixed irritable bowel syndrome: Secondary | ICD-10-CM

## 2018-04-22 DIAGNOSIS — I10 Essential (primary) hypertension: Secondary | ICD-10-CM

## 2018-04-22 DIAGNOSIS — K2100 Gastro-esophageal reflux disease with esophagitis, without bleeding: Secondary | ICD-10-CM

## 2018-04-22 DIAGNOSIS — E785 Hyperlipidemia, unspecified: Secondary | ICD-10-CM

## 2018-04-22 DIAGNOSIS — Z7189 Other specified counseling: Secondary | ICD-10-CM

## 2018-04-22 DIAGNOSIS — Z0000 Encounter for general adult medical examination without abnormal findings: Secondary | ICD-10-CM

## 2018-04-22 DIAGNOSIS — D509 Iron deficiency anemia, unspecified: Secondary | ICD-10-CM

## 2018-04-22 DIAGNOSIS — F4321 Adjustment disorder with depressed mood: Secondary | ICD-10-CM | POA: Diagnosis not present

## 2018-04-22 DIAGNOSIS — N390 Urinary tract infection, site not specified: Secondary | ICD-10-CM | POA: Diagnosis not present

## 2018-04-22 DIAGNOSIS — R7303 Prediabetes: Secondary | ICD-10-CM

## 2018-04-22 DIAGNOSIS — R0989 Other specified symptoms and signs involving the circulatory and respiratory systems: Secondary | ICD-10-CM | POA: Diagnosis not present

## 2018-04-22 DIAGNOSIS — K589 Irritable bowel syndrome without diarrhea: Secondary | ICD-10-CM | POA: Insufficient documentation

## 2018-04-22 MED ORDER — ONDANSETRON HCL 8 MG PO TABS: ORAL_TABLET | ORAL | 0 refills | Status: DC

## 2018-04-22 NOTE — Assessment & Plan Note (Addendum)
Chronic, stable. Continue current regimen (lipitor, fish oil. The 10-year ASCVD risk score Mikey Bussing DC Brooke Bonito., et al., 2013) is: 19.1%   Values used to calculate the score:     Age: 72 years     Sex: Female     Is Non-Hispanic African American: No     Diabetic: No     Tobacco smoker: No     Systolic Blood Pressure: 373 mmHg     Is BP treated: Yes     HDL Cholesterol: 54.2 mg/dL     Total Cholesterol: 196 mg/dL

## 2018-04-22 NOTE — Progress Notes (Signed)
I reviewed health advisor's note, was available for consultation, and agree with documentation and plan.  

## 2018-04-22 NOTE — Assessment & Plan Note (Signed)
Managed with bentyl, zofran prn.

## 2018-04-22 NOTE — Assessment & Plan Note (Signed)
Chronic, stable. Continue current regimen. 

## 2018-04-22 NOTE — Patient Instructions (Addendum)
Contact pharmacy for 2nd dose of shingrix (new shingles shot).  Continue regular weight bearing exercises to keep bones strong.  Work on setting up advanced directive and bring me a copy.  We will order carotid artery ultrasound   Health Maintenance, Female Adopting a healthy lifestyle and getting preventive care can go a long way to promote health and wellness. Talk with your health care provider about what schedule of regular examinations is right for you. This is a good chance for you to check in with your provider about disease prevention and staying healthy. In between checkups, there are plenty of things you can do on your own. Experts have done a lot of research about which lifestyle changes and preventive measures are most likely to keep you healthy. Ask your health care provider for more information. Weight and diet Eat a healthy diet  Be sure to include plenty of vegetables, fruits, low-fat dairy products, and lean protein.  Do not eat a lot of foods high in solid fats, added sugars, or salt.  Get regular exercise. This is one of the most important things you can do for your health. ? Most adults should exercise for at least 150 minutes each week. The exercise should increase your heart rate and make you sweat (moderate-intensity exercise). ? Most adults should also do strengthening exercises at least twice a week. This is in addition to the moderate-intensity exercise.  Maintain a healthy weight  Body mass index (BMI) is a measurement that can be used to identify possible weight problems. It estimates body fat based on height and weight. Your health care provider can help determine your BMI and help you achieve or maintain a healthy weight.  For females 79 years of age and older: ? A BMI below 18.5 is considered underweight. ? A BMI of 18.5 to 24.9 is normal. ? A BMI of 25 to 29.9 is considered overweight. ? A BMI of 30 and above is considered obese.  Watch levels of  cholesterol and blood lipids  You should start having your blood tested for lipids and cholesterol at 73 years of age, then have this test every 5 years.  You may need to have your cholesterol levels checked more often if: ? Your lipid or cholesterol levels are high. ? You are older than 73 years of age. ? You are at high risk for heart disease.  Cancer screening Lung Cancer  Lung cancer screening is recommended for adults 49-12 years old who are at high risk for lung cancer because of a history of smoking.  A yearly low-dose CT scan of the lungs is recommended for people who: ? Currently smoke. ? Have quit within the past 15 years. ? Have at least a 30-pack-year history of smoking. A pack year is smoking an average of one pack of cigarettes a day for 1 year.  Yearly screening should continue until it has been 15 years since you quit.  Yearly screening should stop if you develop a health problem that would prevent you from having lung cancer treatment.  Breast Cancer  Practice breast self-awareness. This means understanding how your breasts normally appear and feel.  It also means doing regular breast self-exams. Let your health care provider know about any changes, no matter how small.  If you are in your 20s or 30s, you should have a clinical breast exam (CBE) by a health care provider every 1-3 years as part of a regular health exam.  If you are 40 or  older, have a CBE every year. Also consider having a breast X-ray (mammogram) every year.  If you have a family history of breast cancer, talk to your health care provider about genetic screening.  If you are at high risk for breast cancer, talk to your health care provider about having an MRI and a mammogram every year.  Breast cancer gene (BRCA) assessment is recommended for women who have family members with BRCA-related cancers. BRCA-related cancers include: ? Breast. ? Ovarian. ? Tubal. ? Peritoneal cancers.  Results  of the assessment will determine the need for genetic counseling and BRCA1 and BRCA2 testing.  Cervical Cancer Your health care provider may recommend that you be screened regularly for cancer of the pelvic organs (ovaries, uterus, and vagina). This screening involves a pelvic examination, including checking for microscopic changes to the surface of your cervix (Pap test). You may be encouraged to have this screening done every 3 years, beginning at age 69.  For women ages 46-65, health care providers may recommend pelvic exams and Pap testing every 3 years, or they may recommend the Pap and pelvic exam, combined with testing for human papilloma virus (HPV), every 5 years. Some types of HPV increase your risk of cervical cancer. Testing for HPV may also be done on women of any age with unclear Pap test results.  Other health care providers may not recommend any screening for nonpregnant women who are considered low risk for pelvic cancer and who do not have symptoms. Ask your health care provider if a screening pelvic exam is right for you.  If you have had past treatment for cervical cancer or a condition that could lead to cancer, you need Pap tests and screening for cancer for at least 20 years after your treatment. If Pap tests have been discontinued, your risk factors (such as having a new sexual partner) need to be reassessed to determine if screening should resume. Some women have medical problems that increase the chance of getting cervical cancer. In these cases, your health care provider may recommend more frequent screening and Pap tests.  Colorectal Cancer  This type of cancer can be detected and often prevented.  Routine colorectal cancer screening usually begins at 73 years of age and continues through 73 years of age.  Your health care provider may recommend screening at an earlier age if you have risk factors for colon cancer.  Your health care provider may also recommend using  home test kits to check for hidden blood in the stool.  A small camera at the end of a tube can be used to examine your colon directly (sigmoidoscopy or colonoscopy). This is done to check for the earliest forms of colorectal cancer.  Routine screening usually begins at age 93.  Direct examination of the colon should be repeated every 5-10 years through 73 years of age. However, you may need to be screened more often if early forms of precancerous polyps or small growths are found.  Skin Cancer  Check your skin from head to toe regularly.  Tell your health care provider about any new moles or changes in moles, especially if there is a change in a mole's shape or color.  Also tell your health care provider if you have a mole that is larger than the size of a pencil eraser.  Always use sunscreen. Apply sunscreen liberally and repeatedly throughout the day.  Protect yourself by wearing long sleeves, pants, a wide-brimmed hat, and sunglasses whenever you are outside.  Heart disease, diabetes, and high blood pressure  High blood pressure causes heart disease and increases the risk of stroke. High blood pressure is more likely to develop in: ? People who have blood pressure in the high end of the normal range (130-139/85-89 mm Hg). ? People who are overweight or obese. ? People who are African American.  If you are 13-66 years of age, have your blood pressure checked every 3-5 years. If you are 65 years of age or older, have your blood pressure checked every year. You should have your blood pressure measured twice-once when you are at a hospital or clinic, and once when you are not at a hospital or clinic. Record the average of the two measurements. To check your blood pressure when you are not at a hospital or clinic, you can use: ? An automated blood pressure machine at a pharmacy. ? A home blood pressure monitor.  If you are between 3 years and 60 years old, ask your health care provider  if you should take aspirin to prevent strokes.  Have regular diabetes screenings. This involves taking a blood sample to check your fasting blood sugar level. ? If you are at a normal weight and have a low risk for diabetes, have this test once every three years after 73 years of age. ? If you are overweight and have a high risk for diabetes, consider being tested at a younger age or more often. Preventing infection Hepatitis B  If you have a higher risk for hepatitis B, you should be screened for this virus. You are considered at high risk for hepatitis B if: ? You were born in a country where hepatitis B is common. Ask your health care provider which countries are considered high risk. ? Your parents were born in a high-risk country, and you have not been immunized against hepatitis B (hepatitis B vaccine). ? You have HIV or AIDS. ? You use needles to inject street drugs. ? You live with someone who has hepatitis B. ? You have had sex with someone who has hepatitis B. ? You get hemodialysis treatment. ? You take certain medicines for conditions, including cancer, organ transplantation, and autoimmune conditions.  Hepatitis C  Blood testing is recommended for: ? Everyone born from [birth year] through 1966. ? Anyone with known risk factors for hepatitis C.  Sexually transmitted infections (STIs)  You should be screened for sexually transmitted infections (STIs) including gonorrhea and chlamydia if: ? You are sexually active and are younger than 73 years of age. ? You are older than 73 years of age and your health care provider tells you that you are at risk for this type of infection. ? Your sexual activity has changed since you were last screened and you are at an increased risk for chlamydia or gonorrhea. Ask your health care provider if you are at risk.  If you do not have HIV, but are at risk, it may be recommended that you take a prescription medicine daily to prevent HIV infection. This  is called pre-exposure prophylaxis (PrEP). You are considered at risk if: ? You are sexually active and do not regularly use condoms or know the HIV status of your partner(s). ? You take drugs by injection. ? You are sexually active with a partner who has HIV.  Talk with your health care provider about whether you are at high risk of being infected with HIV. If you choose to begin PrEP, you should first be tested for HIV.  You should then be tested every 3 months for as long as you are taking PrEP. Pregnancy  If you are premenopausal and you may become pregnant, ask your health care provider about preconception counseling.  If you may become pregnant, take 400 to 800 micrograms (mcg) of folic acid every day.  If you want to prevent pregnancy, talk to your health care provider about birth control (contraception). Osteoporosis and menopause  Osteoporosis is a disease in which the bones lose minerals and strength with aging. This can result in serious bone fractures. Your risk for osteoporosis can be identified using a bone density scan.  If you are 11 years of age or older, or if you are at risk for osteoporosis and fractures, ask your health care provider if you should be screened.  Ask your health care provider whether you should take a calcium or vitamin D supplement to lower your risk for osteoporosis.  Menopause may have certain physical symptoms and risks.  Hormone replacement therapy may reduce some of these symptoms and risks. Talk to your health care provider about whether hormone replacement therapy is right for you. Follow these instructions at home:  Schedule regular health, dental, and eye exams.  Stay current with your immunizations.  Do not use any tobacco products including cigarettes, chewing tobacco, or electronic cigarettes.  If you are pregnant, do not drink alcohol.  If you are breastfeeding, limit how much and how often you drink alcohol.  Limit alcohol intake  to no more than 1 drink per day for nonpregnant women. One drink equals 12 ounces of beer, 5 ounces of wine, or 1 ounces of hard liquor.  Do not use street drugs.  Do not share needles.  Ask your health care provider for help if you need support or information about quitting drugs.  Tell your health care provider if you often feel depressed.  Tell your health care provider if you have ever been abused or do not feel safe at home. This information is not intended to replace advice given to you by your health care provider. Make sure you discuss any questions you have with your health care provider. Document Released: 04/20/2011 Document Revised: 03/12/2016 Document Reviewed: 07/09/2015 Elsevier Interactive Patient Education  [employer].

## 2018-04-22 NOTE — Assessment & Plan Note (Signed)
Reviewed with patient, encouraged avoiding added sugars.  

## 2018-04-22 NOTE — Assessment & Plan Note (Signed)
Preventative protocols reviewed and updated unless pt declined. Discussed healthy diet and lifestyle.  

## 2018-04-22 NOTE — Assessment & Plan Note (Signed)
Managed with daily omeprazole, nightly zantac for breakthrough symptoms

## 2018-04-22 NOTE — Assessment & Plan Note (Signed)
Stable iron panel

## 2018-04-22 NOTE — Assessment & Plan Note (Signed)
PRN xanax.

## 2018-04-22 NOTE — Progress Notes (Signed)
BP 136/76 (BP Location: Right Arm, Patient Position: Sitting, Cuff Size: Normal)   Pulse 75   Temp 97.8 F (36.6 C) (Oral)   Ht 5' 3.75" (1.619 m)   Wt 144 lb 8 oz (65.5 kg)   LMP 10/20/1979 (Within Years)   SpO2 96%   BMI 25.00 kg/m    CC: CPE Subjective:    Patient ID: Bianca Fry, female    DOB: 1945/11/17, 72 y.o.   MRN: [id number]  HPI: Bianca Fry is a 72 y.o. female presenting on 04/22/2018 for Annual Exam (Pt 2.)   Saw Katha Cabal Wed for medicare wellness visit. Note reviewed.    Seen at ER last month for bad R sided headache - found UTI, treated with rocephin and keflex.   Uro - Dr Louis Meckel sees regularly.   Has been treated for IBS with benefit. PRN zofran receives #90 per year  Preventative: COLONOSCOPY Date: 03/2015 mod diverticulosis with focal colitis [facility] Plan) rpt 10 yrs  Breast cancer screening - mammo 07/2017 - reassuring. rec rpt 1 yr  Well woman exam - yearly GYN Dr Josefa Half (01/2018). S/p hysterectomy. On estrace cream.  DEXA scan - 01/2015 - osteopenia T -2.0 in hip. Taking calcium and vitamin D daily. Discussing bisphosphonate. Reviewed weight bearing exercises.  Flu shot yearly  Tdap 2012  Pneumovax 2010, prevnar 2015, pnemovax 03/2016 zostavax - 07/2010  Shingrix - 01/2018 Advanced directive discussion - Has not set up. Would want husband to be HCPOA. Packet provided last visit.  Seat belt use discussed  Sunscreen use discussed, no changing moles on skin, sees derm Non smoker  Alcohol - none Dentist - Q6 mo Eye exam - Q6 mo  Daily caffeine  Lives with husband and 1 dog and 3 cats  Occupation: retired, was in [employer]  Activity: walking 20-30 min/day  Diet: good water, fruits/vegetables daily   Relevant past medical, surgical, family and social history reviewed and updated as indicated. Interim medical history since our last visit reviewed. Allergies and medications reviewed and updated. Outpatient Medications Prior to Visit  Medication Sig  Dispense Refill  . ALPRAZolam (XANAX) 0.5 MG tablet TAKE 1 TABLET BY MOUTH AT BEDTIME AS NEEDED FOR ANXIETY 40 tablet 0  . aspirin EC 81 MG EC tablet Take 1 tablet (81 mg total) by mouth daily. 30 tablet 2  . atenolol (TENORMIN) 25 MG tablet TAKE ONE TABLET BY MOUTH ONCE DAILY 90 tablet 3  . atorvastatin (LIPITOR) 20 MG tablet TAKE 1 TABLET BY MOUTH TWICE A WEEK ON  MONDAY  AND  FRIDAY 24 tablet 1  . Calcium Carb-Cholecalciferol (CALCIUM-VITAMIN D) 600-400 MG-UNIT TABS Take 1 tablet by mouth daily.    . Cholecalciferol (VITAMIN D3 PO) Take by mouth.    . dicyclomine (BENTYL) 10 MG capsule TAKE ONE CAPSULE BY MOUTH TWICE DAILY AS NEEDED FOR SPASMS. 30 capsule 3  . estradiol (ESTRACE) 0.1 MG/GM vaginal cream Use 1/2 g vaginally three times a week. 42.5 g 1  . fexofenadine (ALLEGRA) 180 MG tablet Take 180 mg by mouth daily as needed for allergies.     Marland Kitchen GARLIC PO Take 1 capsule by mouth daily.    . Omega-3 Fatty Acids (FISH OIL) 1000 MG CAPS Take 1,000 mg by mouth daily.    Marland Kitchen omeprazole (PRILOSEC) 40 MG capsule TAKE ONE CAPSULE BY MOUTH ONCE DAILY 90 capsule 3  . ranitidine (ZANTAC) 150 MG tablet Take 1 tablet (150 mg total) by mouth at bedtime.    Marland Kitchen  risedronate (ACTONEL) 150 MG tablet Take 1 tablet (150 mg total) by mouth every 30 (thirty) days. with water on empty stomach, nothing by mouth or lie down for next 30 minutes. 3 tablet 0  . triamcinolone cream (KENALOG) 0.1 % Apply 1 application topically 2 (two) times daily. Apply to AA. Limit to 2 weeks duration 30 g 0  . ondansetron (ZOFRAN) 8 MG tablet TAKE ONE TABLET BY MOUTH EVERY 8 HOURS AS NEEDED FOR NAUSEA AND VOMITING 90 tablet 1   No facility-administered medications prior to visit.      Per HPI unless specifically indicated in ROS section below Review of Systems  Constitutional: Negative for activity change, appetite change, chills, fatigue, fever and unexpected weight change.  HENT: Negative for hearing loss.   Eyes: Negative for  visual disturbance.  Respiratory: Negative for cough, chest tightness, shortness of breath and wheezing.   Cardiovascular: Negative for chest pain, palpitations and leg swelling.  Gastrointestinal: Positive for abdominal pain, constipation, diarrhea and nausea. Negative for abdominal distention, blood in stool and vomiting.       H/o IBS  Genitourinary: Negative for difficulty urinating and hematuria.  Musculoskeletal: Negative for arthralgias, myalgias and neck pain.  Skin: Negative for rash.  Neurological: Positive for headaches (see HPI). Negative for dizziness, seizures and syncope.  Hematological: Negative for adenopathy. Does not bruise/bleed easily.  Psychiatric/Behavioral: Negative for dysphoric mood. The patient is not nervous/anxious.        Objective:    BP 136/76 (BP Location: Right Arm, Patient Position: Sitting, Cuff Size: Normal)   Pulse 75   Temp 97.8 F (36.6 C) (Oral)   Ht 5' 3.75" (1.619 m)   Wt 144 lb 8 oz (65.5 kg)   LMP 10/20/1979 (Within Years)   SpO2 96%   BMI 25.00 kg/m   Wt Readings from Last 3 Encounters:  04/22/18 144 lb 8 oz (65.5 kg)  04/20/18 145 lb (65.8 kg)  04/05/18 143 lb (64.9 kg)    Physical Exam  Constitutional: She is oriented to person, place, and time. She appears well-developed and well-nourished. No distress.  HENT:  Head: Normocephalic and atraumatic.  Right Ear: Hearing, tympanic membrane, external ear and ear canal normal.  Left Ear: Hearing, tympanic membrane, external ear and ear canal normal.  Nose: Nose normal.  Mouth/Throat: Uvula is midline, oropharynx is clear and moist and mucous membranes are normal. No oropharyngeal exudate, posterior oropharyngeal edema or posterior oropharyngeal erythema.  Eyes: Pupils are equal, round, and reactive to light. Conjunctivae and EOM are normal. No scleral icterus.  Neck: Normal range of motion. Neck supple. Carotid bruit is present (faint L sided). No thyromegaly present.  Cardiovascular:  Normal rate, regular rhythm, normal heart sounds and intact distal pulses.  No murmur heard. Pulses:      Radial pulses are 2+ on the right side, and 2+ on the left side.  Pulmonary/Chest: Effort normal and breath sounds normal. No respiratory distress. She has no wheezes. She has no rales.  Abdominal: Soft. Bowel sounds are normal. She exhibits no distension and no mass. There is no tenderness. There is no rebound and no guarding.  Musculoskeletal: Normal range of motion. She exhibits no edema.  Lymphadenopathy:    She has no cervical adenopathy.  Neurological: She is alert and oriented to person, place, and time.  CN grossly intact, station and gait intact  Skin: Skin is warm and dry. No rash noted.  Psychiatric: She has a normal mood and affect. Her behavior is  normal. Judgment and thought content normal.  Nursing note and vitals reviewed.  Results for orders placed or performed in visit on 04/20/18  Folate  Result Value Ref Range   Folate 19.5 >5.9 ng/mL  IBC panel  Result Value Ref Range   Iron 72 42 - 145 ug/dL   Transferrin 268.0 212.0 - 360.0 mg/dL   Saturation Ratios 19.2 (L) 20.0 - 50.0 %  Hemoglobin A1c  Result Value Ref Range   Hgb A1c MFr Bld 6.3 4.6 - 6.5 %  Lipid panel  Result Value Ref Range   Cholesterol 196 0 - 200 mg/dL   Triglycerides 215.0 (H) 0.0 - 149.0 mg/dL   HDL 54.20 >39.00 mg/dL   VLDL 43.0 (H) 0.0 - 40.0 mg/dL   Total CHOL/HDL Ratio 4    NonHDL 141.35   LDL cholesterol, direct  Result Value Ref Range   Direct LDL 103.0 mg/dL      Assessment & Plan:   Problem List Items Addressed This Visit    Recurrent UTI    Regularly sees Dr Louis Meckel (urology).      Prediabetes    Reviewed with patient, encouraged avoiding added sugars.       Left carotid bruit    Baseline [place] ordered today.      Relevant Orders   VAS US CAROTID   Irritable bowel syndrome (IBS)    Managed with bentyl, zofran prn.      Relevant Medications   ondansetron  (ZOFRAN) 8 MG tablet   Iron deficiency anemia    Stable iron panel       HLD (hyperlipidemia)    Chronic, stable. Continue current regimen (lipitor, fish oil. The 10-year ASCVD risk score Mikey Bussing DC Brooke Bonito., et al., 2013) is: 19.1%   Values used to calculate the score:     Age: 76 years     Sex: Female     Is Non-Hispanic African American: No     Diabetic: No     Tobacco smoker: No     Systolic Blood Pressure: 242 mmHg     Is BP treated: Yes     HDL Cholesterol: 54.2 mg/dL     Total Cholesterol: 196 mg/dL       Health maintenance examination - Primary    Preventative protocols reviewed and updated unless pt declined. Discussed healthy diet and lifestyle.       GERD    Managed with daily omeprazole, nightly zantac for breakthrough symptoms      Relevant Medications   ondansetron (ZOFRAN) 8 MG tablet   Essential hypertension    Chronic, stable. Continue current regimen.       Advanced care planning/counseling discussion    Advanced directive discussion - Has not set up. Would want husband to be HCPOA. Packet provided last visit.       Adjustment disorder with depressed mood    PRN xanax.           Meds ordered this encounter  Medications  . ondansetron (ZOFRAN) 8 MG tablet    Sig: TAKE ONE TABLET BY MOUTH EVERY 8 HOURS AS NEEDED FOR NAUSEA AND VOMITING    Dispense:  90 tablet    Refill:  0   No orders of the defined types were placed in this encounter.   Follow up plan: Return in about 1 year (around 04/23/2019) for annual exam, prior fasting for blood work, medicare wellness visit.  Ria Bush, MD

## 2018-04-22 NOTE — Assessment & Plan Note (Addendum)
Regularly sees Dr Louis Meckel (urology).

## 2018-04-22 NOTE — Assessment & Plan Note (Signed)
Advanced directive discussion - Has not set up. Would want husband to be HCPOA. Packet providedlast visit.  

## 2018-04-22 NOTE — Assessment & Plan Note (Signed)
Baseline [place] ordered today.

## 2018-05-02 ENCOUNTER — Ambulatory Visit (INDEPENDENT_AMBULATORY_CARE_PROVIDER_SITE_OTHER): Payer: PPO

## 2018-05-02 DIAGNOSIS — R0989 Other specified symptoms and signs involving the circulatory and respiratory systems: Secondary | ICD-10-CM

## 2018-05-04 ENCOUNTER — Other Ambulatory Visit: Payer: Self-pay

## 2018-05-04 ENCOUNTER — Ambulatory Visit (INDEPENDENT_AMBULATORY_CARE_PROVIDER_SITE_OTHER): Payer: PPO | Admitting: Obstetrics and Gynecology

## 2018-05-04 ENCOUNTER — Encounter: Payer: Self-pay | Admitting: Obstetrics and Gynecology

## 2018-05-04 VITALS — BP 112/60 | HR 68 | Resp 16 | Ht 64.0 in | Wt 144.0 lb

## 2018-05-04 DIAGNOSIS — M810 Age-related osteoporosis without current pathological fracture: Secondary | ICD-10-CM

## 2018-05-04 DIAGNOSIS — R350 Frequency of micturition: Secondary | ICD-10-CM | POA: Diagnosis not present

## 2018-05-04 DIAGNOSIS — N949 Unspecified condition associated with female genital organs and menstrual cycle: Secondary | ICD-10-CM

## 2018-05-04 DIAGNOSIS — N390 Urinary tract infection, site not specified: Secondary | ICD-10-CM

## 2018-05-04 LAB — POCT URINALYSIS DIPSTICK
BILIRUBIN UA: NEGATIVE
Glucose, UA: NEGATIVE
Ketones, UA: NEGATIVE
Nitrite, UA: NEGATIVE
Protein, UA: NEGATIVE
Urobilinogen, UA: 0.2 E.U./dL
pH, UA: 5 (ref 5.0–8.0)

## 2018-05-04 MED ORDER — RISEDRONATE SODIUM 150 MG PO TABS: 150.0000 mg | ORAL_TABLET | ORAL | 0 refills | Status: DC
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
## 2018-05-05 ENCOUNTER — Other Ambulatory Visit: Payer: Self-pay

## 2018-05-05 LAB — URINE CULTURE

## 2018-05-05 LAB — URINALYSIS, MICROSCOPIC ONLY
Casts: NONE SEEN /lpf
WBC, UA: >30 /hpf — AB (ref 0–5)

## 2018-05-05 LAB — VAGINITIS/VAGINOSIS, DNA PROBE
Candida Species: NEGATIVE
Gardnerella vaginalis: NEGATIVE
Trichomonas vaginosis: NEGATIVE

## 2018-05-05 NOTE — Patient Outreach (Signed)
[facility]) Care Management  05/05/2018  Bianca Fry 03-Apr-1946 [id number]   Telephone Screen  Referral Date: 05/05/18 Referral Source: [facility] Concierge Referral Reason: "member states she can not afford her RX" Insurance: [employer]  Outreach attempt # 1 to patient. Spoke with patient.   Social: Patient resides in the home along with her spouse. She voices that she is independent with ADLs/IADLs. She denies any recent falls. She drives herself to medical appts.    Conditions: Per chart review, patient has PMH of osteoporosis, HLD, anemia, IBS, pre-diabetes, GERD and HTN. She denies any issues managing her conditions.    Medications: Patient voices that MD has ordered her to start taking new med-Actonel. She states that she went to get med filled and was told it was a Tier 4 and will cost her $180.00 for a 90 day supply. She states that she is not able to afford her estrogen cream as it is $90.00. Patient voices that she is not in doughnut hole. However, her and her spouse have had a lot of medical expenses this year that has placed a financial hardship on them. Patient aware that she needs to discuss her inability to afford meds with prescribing MD to discuss alternatives.  Consent: [facility] services reviewed and discussed with patient. Patient voices only needing pharmacy assistance at this time.   Plan: RN CM will send [facility] pharmacy referral for possible med assistance.    Enzo Montgomery, RN,BSN,CCM [place] Management Telephonic Care Management Coordinator Direct Phone: [phone] Toll Free: [phone] Fax: [phone]

## 2018-05-06 ENCOUNTER — Other Ambulatory Visit: Payer: Self-pay | Admitting: Pharmacist

## 2018-05-06 ENCOUNTER — Telehealth: Payer: Self-pay | Admitting: *Deleted

## 2018-05-06 NOTE — Telephone Encounter (Signed)
Forwarding to Dr. Antony Blackbird. Encounter closed.    Notes recorded by Burnice Logan, RN on 05/06/2018 at 10:09 AM EDT Advised as seen below per Dr. Quincy Simmonds. OV scheduled for 05/10/18 at 9:45am with Dr. Quincy Simmonds, declined earlier OV offered .Patient verbalizes understanding and is agreeable. See telephone encounter dated 05/06/18 to update provider.

## 2018-05-06 NOTE — Telephone Encounter (Signed)
-----   Message from Nunzio Cobbs, MD sent at 05/05/2018 11:14 PM EDT ----- Please report results of urine micro showing RBCs and WBCs but negative for infection.  Please have her return for a urine catheter specimen for further analysis to see if this is from the bladder or represents vaginal contamination of the urine specimen.

## 2018-05-06 NOTE — Patient Outreach (Signed)
[facility]) Care Management  05/06/2018  Bianca Fry 03/20/1946 [id number]   72 year old female referred to [place] [facility] by [employer] Concierge  for medication assistance with Actonel and Estrace cream.    Unsuccessful call to Ms. Vickey Huger on both the home phone and mobile phones listed in [place].  No voicemail available to leave message on home phone.  I left a HIPAA compliant voicemail on the mobile phone.    Plan: I will mail patient an unsuccessful outreach letter describing [facility] services and make 2nd outreach attempt next week.   Ralene Bathe, PharmD, [place] [phone]
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
## 2018-05-10 ENCOUNTER — Encounter: Payer: Self-pay | Admitting: Obstetrics and Gynecology

## 2018-05-10 ENCOUNTER — Ambulatory Visit (INDEPENDENT_AMBULATORY_CARE_PROVIDER_SITE_OTHER): Payer: PPO | Admitting: Obstetrics and Gynecology

## 2018-05-10 ENCOUNTER — Other Ambulatory Visit: Payer: Self-pay

## 2018-05-10 VITALS — BP 144/66 | HR 66 | Resp 14 | Ht 64.0 in | Wt 144.4 lb

## 2018-05-10 DIAGNOSIS — R3989 Other symptoms and signs involving the genitourinary system: Secondary | ICD-10-CM

## 2018-05-10 DIAGNOSIS — N390 Urinary tract infection, site not specified: Secondary | ICD-10-CM | POA: Diagnosis not present

## 2018-05-10 DIAGNOSIS — R8281 Pyuria: Secondary | ICD-10-CM

## 2018-05-11 ENCOUNTER — Other Ambulatory Visit (HOSPITAL_COMMUNITY)
Admission: RE | Admit: 2018-05-11 | Discharge: 2018-05-11 | Disposition: A | Discharge status: Home / Self Care | Payer: PPO | Source: Ambulatory Visit | Attending: Obstetrics and Gynecology | Admitting: Obstetrics and Gynecology

## 2018-05-11 ENCOUNTER — Other Ambulatory Visit: Payer: Self-pay | Admitting: *Deleted

## 2018-05-11 DIAGNOSIS — N390 Urinary tract infection, site not specified: Secondary | ICD-10-CM

## 2018-05-11 DIAGNOSIS — R82998 Other abnormal findings in urine: Secondary | ICD-10-CM | POA: Diagnosis not present

## 2018-05-11 LAB — URINE CULTURE

## 2018-05-11 LAB — URINALYSIS, MICROSCOPIC ONLY
CASTS: NONE SEEN /LPF
EPITHELIAL CELLS (NON RENAL): NONE SEEN /HPF (ref 0–10)

## 2018-05-11 NOTE — Progress Notes (Signed)
Urine cyto

## 2018-05-12 ENCOUNTER — Telehealth: Payer: Self-pay | Admitting: Obstetrics and Gynecology

## 2018-05-12 ENCOUNTER — Telehealth: Payer: Self-pay | Admitting: *Deleted

## 2018-05-12 ENCOUNTER — Other Ambulatory Visit: Payer: Self-pay | Admitting: Pharmacist

## 2018-05-12 ENCOUNTER — Other Ambulatory Visit: Payer: Self-pay | Admitting: Pharmacy Technician

## 2018-05-12 DIAGNOSIS — Z87440 Personal history of urinary (tract) infections: Secondary | ICD-10-CM | POA: Insufficient documentation

## 2018-05-12 DIAGNOSIS — R3989 Other symptoms and signs involving the genitourinary system: Secondary | ICD-10-CM | POA: Insufficient documentation

## 2018-05-12 DIAGNOSIS — N952 Postmenopausal atrophic vaginitis: Secondary | ICD-10-CM | POA: Diagnosis not present

## 2018-05-12 DIAGNOSIS — R102 Pelvic and perineal pain: Secondary | ICD-10-CM | POA: Insufficient documentation

## 2018-05-12 DIAGNOSIS — R8281 Pyuria: Secondary | ICD-10-CM | POA: Insufficient documentation

## 2018-05-12 DIAGNOSIS — N390 Urinary tract infection, site not specified: Secondary | ICD-10-CM | POA: Diagnosis not present

## 2018-05-12 DIAGNOSIS — B9620 Unspecified Escherichia coli [E. coli] as the cause of diseases classified elsewhere: Secondary | ICD-10-CM | POA: Diagnosis not present

## 2018-05-12 NOTE — Telephone Encounter (Signed)
Notes recorded by Burnice Logan, RN on 05/12/2018 at 10:33 AM EDT Left message to call Sharee Pimple at [phone].

## 2018-05-12 NOTE — Telephone Encounter (Signed)
Fosamax is not the best option.  The patient has reflux and is taking more than one medication to control this.  Actonel is a better alternative for her for oral medication.

## 2018-05-12 NOTE — Patient Outreach (Addendum)
[facility]) Care Management  05/12/2018  COLLIER MONICA 1946/09/08 [id number]  72 year old female referred to [place] [facility] by [employer]  for medication assistance with Actonel and Estrace cream.    Successful call to Ms. Bergdoll today on her mobile phone.  HIPAA identifiers verified. Patient reports she is having difficulty affording Estrace and Actonel.  She denies any other medication issues or concerns and declines medication reconciliation.  She verifies that she has [employer].  She reports she paid $170 for Estrace cream last year and has been using is sparingly to make it last.  She previously tried premarin but switched to Estrace due to irritation. Patient states generic size was much smaller than brand name and she prefers to use brand. She was told that Actonel co-pay is $180 / 90 day supply.    Medication Assistance:  Per review of 2019 HTA documents:  Actonel = Non Formulary.  Alternative is Fosamax, Tier 1 (only Tier 1 option)     Estrace = Non Formulary.  Generic estradiol 0.1 mg/gm = Tier 3 ($45 /30 DS or $90 / 90 DS).     -Estrace PAP: ([employer]) - patient may be eligible to apply based on reported income.  She is not near the LIS income therefore should not need to have denial letter.     Plan: 1. I will contact Dr. Elza Rafter office to relay message regarding substitution from Actonel --> Fosamax if clinically warranted.   2. I will route patient assistance letter to pharmacy technician, Etter Sjogren, who will coordinate application process for Estrace through [place].  She will assist with obtaining all pertinent documents from both patient and Dr. Florence Canner and submit application once completed.    Ralene Bathe, PharmD, [place] [phone]

## 2018-05-12 NOTE — Telephone Encounter (Signed)
-----   Message from Nunzio Cobbs, MD sent at 05/12/2018  6:29 AM EDT ----- Please inform patient that her catheter specimen urine contains white blood cells, red blood cells, and crystals. A repeat urine culture is negative for infection.  The urine cytology is being processed separately.   I do want her to see Dr. Alona Bene in [place] for her bladder pain and recurrent UTIs. I have already placed this referral.

## 2018-05-12 NOTE — Telephone Encounter (Signed)
Ok for Boniva 150 mg orally once monthly.  Disp:  3 RF: none.  She needs recheck with me in 3 months.  If she has increased reflux symptoms, stop medication and call the office.  Theoretically, any bisphosphonate can do this, but the Fosamax is more associated with this.

## 2018-05-12 NOTE — Telephone Encounter (Signed)
Please let me know what other options are available through patient's insurance.  Boniva may be of consideration as well.

## 2018-05-12 NOTE — Telephone Encounter (Signed)
PA denied for Actonel, appeal submitted to [facility].    Call returned to [place] at [facility], [place] full, unable to leave message.

## 2018-05-12 NOTE — Patient Outreach (Signed)
[place] [facility]) Care Management  05/13/2018  CHARLCIE PRISCO [birth year] [id number]   Received [employer] patient assistance referral from [place] for Estrace. Prepared patient portion to be mailed and faxed provider portion to Dr. Quincy Simmonds.  Will follow up with patient in 7-10 business days to confirm application has been received.  Maud Deed [place], [place] [facility] [phone]

## 2018-05-12 NOTE — Telephone Encounter (Signed)
Spoke with patient, advised as seen below per Dr. Quincy Simmonds. Patient states she was seen by Dr> evans today, thankful for referral.   Updated patient on medications, see 2nd telephone encounter dated 05/12/18. Patient agreeable to plan.   Routing to provider for final review. Patient is agreeable to disposition. Will close encounter.

## 2018-05-12 NOTE — Telephone Encounter (Signed)
Spoke with [place] at [facility]. They are assisting patient with medication assistance for Actonel and Estrace cream, see note in Epic.   1. Actonel is Non Formulary, alternative is Fosamax, Tier 1. Asking if Fosmax is an appropriate alternative.   2. Will be faxing additional paperwork to the office to be completed for assistance with Estrace cream.   Dr. Quincy Simmonds -please advise on Fosamax Rx.

## 2018-05-12 NOTE — Telephone Encounter (Signed)
Colleen at [facility] is calling to talk with a nurse about this patient's prescription.

## 2018-05-12 NOTE — Telephone Encounter (Signed)
Spoke with Bianca Fry, was advised Bianca Fry would be a Tier 2 medication with $15 co-pay vs $90 for 30 days supply, request to review with Dr. Quincy Simmonds and send Rx of appropriate.    Dr. Quincy Simmonds -please advise on Boniva Rx?

## 2018-05-12 NOTE — Telephone Encounter (Signed)
Spoke with [place] at [facility], advised as seen below per Dr. Quincy Simmonds. Was advised Actonel is a Tier 4 medication, patient will likely not be able to afford out of pocket cost if approved. Jaclyn Shaggy will research options for medication assistance for Actonel, is aware to return call if any additional information or assistance needed.   Routing to Dr. Quincy Simmonds, encounter closed.

## 2018-05-13 ENCOUNTER — Other Ambulatory Visit: Payer: Self-pay | Admitting: Family Medicine

## 2018-05-13 MED ORDER — IBANDRONATE SODIUM 150 MG PO TABS: 150.0000 mg | ORAL_TABLET | ORAL | 0 refills | Status: DC

## 2018-05-13 NOTE — Addendum Note (Signed)
Addended by: Burnice Logan on: 05/13/2018 08:03 AM   Modules accepted: Orders

## 2018-05-13 NOTE — Telephone Encounter (Signed)
Dr. Damita Dunnings, will you address in Dr. Synthia Innocent absence?  Name of Medication: Alprazolam Name of Pharmacy: [facility] or Written Date and Quantity: 03/22/18, #40/0 Last Office Visit and Type: 04/22/18, CPE Next Office Visit and Type: 05/11/19, CPE Last Controlled Substance Agreement Date: 04/16/17 Last UDS: 04/16/17

## 2018-05-13 NOTE — Telephone Encounter (Signed)
Spoke with patient, advised as seen below per Dr. Quincy Simmonds. Patient verbalizes understanding and is agreeable.  OV scheduled for 08/04/18. Encounter closed.

## 2018-05-15 NOTE — Telephone Encounter (Signed)
Sent. Thanks.   

## 2018-05-24 ENCOUNTER — Telehealth: Payer: Self-pay | Admitting: Family Medicine

## 2018-05-24 NOTE — Telephone Encounter (Signed)
VAS US Carotid results are under CV Proc tab.Please advise.

## 2018-05-24 NOTE — Telephone Encounter (Signed)
Copied from [place] [phone] [phone]. Topic: General - Other >> May 24, 2018  4:19 PM Jarold Motto, Fraser Din wrote: Reason for CRM: pt called and stated that she never received results from  VAS US CAROTID which was done on 05/02/18. Please advise

## 2018-05-26 NOTE — Telephone Encounter (Signed)
See result note.  

## 2018-05-27 DIAGNOSIS — N390 Urinary tract infection, site not specified: Secondary | ICD-10-CM | POA: Diagnosis not present

## 2018-05-30 ENCOUNTER — Other Ambulatory Visit: Payer: Self-pay | Admitting: Pharmacy Technician

## 2018-05-30 NOTE — Patient Outreach (Signed)
[facility] [facility]) Care Management  05/30/2018  Bianca Fry 15-Feb-1946 [id number]   Received patient portion on [employer] patient assistance application for Estrace. Faxed completed application and required documents to [place].  Will follow up with company in 28-30 business days to check status of application.  Maud Deed [place], [place] Management [phone]

## 2018-05-31 NOTE — Telephone Encounter (Signed)
Spoke with pt relaying results and message per Dr. Henriette Combs understanding.

## 2018-06-09 DIAGNOSIS — Z87440 Personal history of urinary (tract) infections: Secondary | ICD-10-CM | POA: Diagnosis not present

## 2018-06-09 DIAGNOSIS — R82998 Other abnormal findings in urine: Secondary | ICD-10-CM | POA: Diagnosis not present

## 2018-06-09 DIAGNOSIS — B965 Pseudomonas (aeruginosa) (mallei) (pseudomallei) as the cause of diseases classified elsewhere: Secondary | ICD-10-CM | POA: Diagnosis not present

## 2018-06-09 DIAGNOSIS — R102 Pelvic and perineal pain: Secondary | ICD-10-CM | POA: Diagnosis not present

## 2018-06-09 DIAGNOSIS — N952 Postmenopausal atrophic vaginitis: Secondary | ICD-10-CM | POA: Diagnosis not present

## 2018-06-09 DIAGNOSIS — R3989 Other symptoms and signs involving the genitourinary system: Secondary | ICD-10-CM | POA: Diagnosis not present

## 2018-06-09 DIAGNOSIS — N3010 Interstitial cystitis (chronic) without hematuria: Secondary | ICD-10-CM | POA: Diagnosis not present

## 2018-06-11 ENCOUNTER — Other Ambulatory Visit: Payer: Self-pay | Admitting: Family Medicine

## 2018-06-11 ENCOUNTER — Encounter: Payer: Self-pay | Admitting: Family Medicine

## 2018-06-11 DIAGNOSIS — I771 Stricture of artery: Secondary | ICD-10-CM | POA: Insufficient documentation

## 2018-06-17 ENCOUNTER — Other Ambulatory Visit: Payer: Self-pay | Admitting: Pharmacist

## 2018-06-17 NOTE — Patient Outreach (Signed)
[facility]) Care Management  06/17/2018  Bianca Fry 10/08/1946 [id number]   Incoming call and voicemail received from Ms. Bianca Fry.  Return call placed to patient this morning. Patient requests update on patient assistance application for Estrace via [employer].  I informed patient that application submitted on 05/30/2018 and is currently in process.  [facility] pharmacy technician will follow-up with [employer] in another 1- 2 weeks regarding application status as usual process takes 4 weeks.  I provided [place] phone number to patient if she would also like to call for the application status.  Patient voiced understanding and stated she will try to call later today.    Plan: [place] technician will follow-up as scheduled with [employer].   Ralene Bathe, PharmD, [place] [phone]

## 2018-06-23 ENCOUNTER — Telehealth: Payer: Self-pay | Admitting: Obstetrics and Gynecology

## 2018-06-23 NOTE — Telephone Encounter (Signed)
Spoke with patient. Started Boniva 150mg  tab q30 days on 9/3. Took as directed with full glass of water on empty stomach. Reports stomach cramping, diarrhea, increased reflux, no appetite, no energy since Tuesday night. Was able to eat small amount of food this morning.   Patient states she does not plan to take anymore of the medication. Advised patient to stop medication, will review with Lorin Picket and return call with recommendations. Patient agreeable.   Dr. Quincy Simmonds -please advise

## 2018-06-23 NOTE — Telephone Encounter (Signed)
Patient started taking Actonel on Tuesday and started having stomach cramps. Yesterday she started having diarrhea, reflux, and no appetite. Unsure if side effect from medication.

## 2018-06-24 NOTE — Telephone Encounter (Signed)
Spoke with patient, advised as seen below per Dr. Silva.  Patient verbalizes understanding and is agreeable.  Encounter closed.  

## 2018-06-24 NOTE — Telephone Encounter (Signed)
I recommend she does not take the Actonel.  If her symptoms persist, she needs a visit with her PCP.  I will have her repeat her BMD in 2020.

## 2018-06-27 ENCOUNTER — Encounter: Payer: Self-pay | Admitting: Emergency Medicine

## 2018-06-27 ENCOUNTER — Emergency Department
Admission: EM | Admit: 2018-06-27 | Discharge: 2018-06-27 | Disposition: A | Discharge status: Home / Self Care | Payer: PPO | Attending: Emergency Medicine | Admitting: Emergency Medicine

## 2018-06-27 ENCOUNTER — Emergency Department: Payer: PPO

## 2018-06-27 ENCOUNTER — Other Ambulatory Visit: Payer: Self-pay

## 2018-06-27 DIAGNOSIS — K5730 Diverticulosis of large intestine without perforation or abscess without bleeding: Secondary | ICD-10-CM | POA: Diagnosis not present

## 2018-06-27 DIAGNOSIS — I10 Essential (primary) hypertension: Secondary | ICD-10-CM | POA: Diagnosis not present

## 2018-06-27 DIAGNOSIS — Z79899 Other long term (current) drug therapy: Secondary | ICD-10-CM | POA: Diagnosis not present

## 2018-06-27 DIAGNOSIS — I2510 Atherosclerotic heart disease of native coronary artery without angina pectoris: Secondary | ICD-10-CM | POA: Insufficient documentation

## 2018-06-27 DIAGNOSIS — R197 Diarrhea, unspecified: Secondary | ICD-10-CM | POA: Diagnosis not present

## 2018-06-27 DIAGNOSIS — R1032 Left lower quadrant pain: Secondary | ICD-10-CM | POA: Insufficient documentation

## 2018-06-27 DIAGNOSIS — Z7982 Long term (current) use of aspirin: Secondary | ICD-10-CM | POA: Insufficient documentation

## 2018-06-27 LAB — URINALYSIS, COMPLETE (UACMP) WITH MICROSCOPIC
BILIRUBIN URINE: NEGATIVE
Bacteria, UA: NONE SEEN
GLUCOSE, UA: NEGATIVE mg/dL
KETONES UR: NEGATIVE mg/dL
Nitrite: NEGATIVE
PH: 5 (ref 5.0–8.0)
Protein, ur: NEGATIVE mg/dL
SPECIFIC GRAVITY, URINE: 1.02 (ref 1.005–1.030)

## 2018-06-27 LAB — COMPREHENSIVE METABOLIC PANEL
ALK PHOS: 96 U/L (ref 38–126)
ALT: 17 U/L (ref 0–44)
AST: 20 U/L (ref 15–41)
Albumin: 3.6 g/dL (ref 3.5–5.0)
Anion gap: 8 (ref 5–15)
BUN: 13 mg/dL (ref 8–23)
CALCIUM: 8.7 mg/dL — AB (ref 8.9–10.3)
CO2: 25 mmol/L (ref 22–32)
CREATININE: 0.86 mg/dL (ref 0.44–1.00)
Chloride: 104 mmol/L (ref 98–111)
Glucose, Bld: 116 mg/dL — ABNORMAL HIGH (ref 70–99)
Potassium: 3.3 mmol/L — ABNORMAL LOW (ref 3.5–5.1)
SODIUM: 137 mmol/L (ref 135–145)
Total Bilirubin: 0.6 mg/dL (ref 0.3–1.2)
Total Protein: 7 g/dL (ref 6.5–8.1)

## 2018-06-27 LAB — GASTROINTESTINAL PANEL BY PCR, STOOL (REPLACES STOOL CULTURE)
ADENOVIRUS F40/41: NOT DETECTED
ASTROVIRUS: NOT DETECTED
CYCLOSPORA CAYETANENSIS: NOT DETECTED
Campylobacter species: NOT DETECTED
Cryptosporidium: NOT DETECTED
ENTAMOEBA HISTOLYTICA: NOT DETECTED
ENTEROPATHOGENIC E COLI (EPEC): NOT DETECTED
ENTEROTOXIGENIC E COLI (ETEC): NOT DETECTED
Enteroaggregative E coli (EAEC): NOT DETECTED
GIARDIA LAMBLIA: NOT DETECTED
Norovirus GI/GII: NOT DETECTED
PLESIMONAS SHIGELLOIDES: NOT DETECTED
Rotavirus A: NOT DETECTED
SHIGA LIKE TOXIN PRODUCING E COLI (STEC): NOT DETECTED
Salmonella species: NOT DETECTED
Sapovirus (I, II, IV, and V): NOT DETECTED
Shigella/Enteroinvasive E coli (EIEC): NOT DETECTED
VIBRIO CHOLERAE: NOT DETECTED
VIBRIO SPECIES: NOT DETECTED
Yersinia enterocolitica: NOT DETECTED

## 2018-06-27 LAB — C DIFFICILE QUICK SCREEN W PCR REFLEX
C Diff antigen: NEGATIVE
C Diff interpretation: NOT DETECTED
C Diff toxin: NEGATIVE

## 2018-06-27 LAB — CBC
HCT: 38.5 % (ref 35.0–47.0)
HEMOGLOBIN: 13.2 g/dL (ref 12.0–16.0)
MCH: 27.4 pg (ref 26.0–34.0)
MCHC: 34.2 g/dL (ref 32.0–36.0)
MCV: 80.1 fL (ref 80.0–100.0)
PLATELETS: 185 10*3/uL (ref 150–440)
RBC: 4.81 MIL/uL (ref 3.80–5.20)
RDW: 13.9 % (ref 11.5–14.5)
WBC: 8 10*3/uL (ref 3.6–11.0)

## 2018-06-27 LAB — LIPASE, BLOOD: Lipase: 28 U/L (ref 11–51)

## 2018-06-27 MED ORDER — POTASSIUM CHLORIDE 20 MEQ PO PACK
40.0000 meq | PACK | Freq: Once | ORAL | Status: AC
  Administered 2018-06-27: 40 meq via ORAL
  Filled 2018-06-27: qty 2

## 2018-06-27 MED ORDER — SODIUM CHLORIDE 0.9 % IV BOLUS
1000.0000 mL | Freq: Once | INTRAVENOUS | Status: AC
  Administered 2018-06-27: 1000 mL via INTRAVENOUS

## 2018-06-27 MED ORDER — IOPAMIDOL (ISOVUE-300) INJECTION 61%
100.0000 mL | Freq: Once | INTRAVENOUS | Status: AC | PRN
  Administered 2018-06-27: 100 mL via INTRAVENOUS

## 2018-06-27 MED ORDER — LOPERAMIDE HCL 2 MG PO CAPS
4.0000 mg | ORAL_CAPSULE | Freq: Once | ORAL | Status: AC
  Administered 2018-06-27: 4 mg via ORAL
  Filled 2018-06-27: qty 2

## 2018-06-27 NOTE — ED Triage Notes (Signed)
Pt reports has been having diarrhea since Wednesday reports started a new medication Boniva on Tuesday. Pt reports has diarrhea about every 10 minutes reports yesterday symptoms got better reports this morning woke up and has had about 5 episodes of diarrhea. Pt talks in complete sentences no respiratory distress noted

## 2018-06-27 NOTE — ED Notes (Addendum)
Pt says she started a new medication Tuesday morning, Boniva, and that night began having severe abd pain; pt says she had a normal bowel movement Wednesday morning but has multiple episodes of diarrhea daily since then; says sometimes the diarrhea comes on so quickly she doesn't have time to get to the bathroom;  pt says yesterday was her "slowest" day of episodes but since midnight she's had 4-5 episodes; pt says she just feels worn out; "I feel yuck";  denies abd pain at this time; denies nausea at this time; lower abd tender on palpation, increased to right lower quadrant;
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
## 2018-06-27 NOTE — ED Notes (Signed)
Dr Brown in to assess pt.

## 2018-06-30 ENCOUNTER — Other Ambulatory Visit: Payer: Self-pay | Admitting: Pharmacy Technician

## 2018-06-30 NOTE — Patient Outreach (Signed)
[place] [facility]) Care Management  06/30/2018  Bianca Fry [birth year] [id number]   Follow up call to [place] patient assistance to check status of application for Estrace. Beverlee Nims stated patient had been denied due to copayment amount.  Will route note to [place] to inform.  Maud Deed [place], [place] Management [phone]

## 2018-07-01 ENCOUNTER — Encounter: Payer: Self-pay | Admitting: Family Medicine

## 2018-07-01 ENCOUNTER — Ambulatory Visit (INDEPENDENT_AMBULATORY_CARE_PROVIDER_SITE_OTHER): Payer: PPO | Admitting: Family Medicine

## 2018-07-01 ENCOUNTER — Other Ambulatory Visit: Payer: Self-pay | Admitting: Pharmacist

## 2018-07-01 VITALS — BP 130/64 | HR 62 | Temp 97.9°F | Ht 63.75 in | Wt 142.8 lb

## 2018-07-01 DIAGNOSIS — R197 Diarrhea, unspecified: Secondary | ICD-10-CM | POA: Insufficient documentation

## 2018-07-01 DIAGNOSIS — M810 Age-related osteoporosis without current pathological fracture: Secondary | ICD-10-CM | POA: Insufficient documentation

## 2018-07-01 DIAGNOSIS — M8580 Other specified disorders of bone density and structure, unspecified site: Secondary | ICD-10-CM | POA: Diagnosis not present

## 2018-07-01 DIAGNOSIS — N390 Urinary tract infection, site not specified: Secondary | ICD-10-CM | POA: Diagnosis not present

## 2018-07-01 MED ORDER — ALPRAZOLAM 0.5 MG PO TABS: ORAL_TABLET | ORAL | 0 refills | Status: DC

## 2018-07-01 NOTE — Assessment & Plan Note (Signed)
Has established with Dr Amalia Hailey.  Did not tolerate elmiron.

## 2018-07-01 NOTE — Patient Instructions (Addendum)
I agree with avoiding boniva and bisphosphonates at this time. Stool tests for infection returned negative.  If osteoporosis progresses, there are other medicines that can be tried.  Continue advancing diet as tolerated. Push fluids to ensure good hydration status.

## 2018-07-01 NOTE — Assessment & Plan Note (Signed)
Most likely med related - boniva. Will avoid oral bisphosphonates from here on out. Infectious evaluation was normal at ER. Illness largely resolving now.

## 2018-07-01 NOTE — Patient Outreach (Signed)
[facility]) Care Management  07/01/2018  ASTRIA JORDAHL [birth year] [id number]  Call placed to Ms. Kimiyo Vilar to inform her that the Estrace PAP application was denied due to patient having a co-pay through [facility].  Patient voiced understanding and appreciation of [facility] efforts.   Plan: I will close [place] case at this time but am happy to assist in the future as needed.   Ralene Bathe, PharmD, [place] [phone]

## 2018-07-01 NOTE — Progress Notes (Signed)
BP 130/64 (BP Location: Left Arm, Patient Position: Sitting, Cuff Size: Normal)   Pulse 62   Temp 97.9 F (36.6 C) (Oral)   Ht 5' 3.75" (1.619 m)   Wt 142 lb 12 oz (64.8 kg)   LMP 10/20/1979 (Within Years)   SpO2 96%   BMI 24.70 kg/m    CC: ER f/u visit Subjective:    Patient ID: Bianca Fry, female    DOB: 14-May-1946, 72 y.o.   MRN: [id number]  HPI: Bianca Fry is a 72 y.o. female presenting on 07/01/2018 for Hospitalization Follow-up   Recent ER visit 9/9 for 6d diarrhea. Records reviewed. CT abd/pelvis with liquid stool throughout colon and possible mild mucosal enhancement without wall thickening. Treated with imodium 4mg  and 2L NS IVF. Since home, having more formed stools still soft. Having 1 stool a day.  She started boniva for OP 1d prior to GI symptoms developing - abdominal pain and nausea. No fevers or blood in stool.   Saw urologist Dr Amalia Hailey for chronic cystitis - started on elmiron which caused chest tightness and wheezing so she stopped this.   Relevant past medical, surgical, family and social history reviewed and updated as indicated. Interim medical history since our last visit reviewed. Allergies and medications reviewed and updated. Outpatient Medications Prior to Visit  Medication Sig Dispense Refill  . aspirin EC 81 MG EC tablet Take 1 tablet (81 mg total) by mouth daily. 30 tablet 2  . atenolol (TENORMIN) 25 MG tablet TAKE ONE TABLET BY MOUTH ONCE DAILY 90 tablet 3  . atorvastatin (LIPITOR) 20 MG tablet TAKE 1 TABLET BY MOUTH TWICE A WEEK ON  MONDAY  AND  FRIDAY 24 tablet 1  . Calcium Carb-Cholecalciferol (CALCIUM-VITAMIN D) 600-400 MG-UNIT TABS Take 1 tablet by mouth daily.    . Cholecalciferol (VITAMIN D3 PO) Take by mouth.    . dicyclomine (BENTYL) 10 MG capsule TAKE ONE CAPSULE BY MOUTH TWICE DAILY AS NEEDED FOR SPASMS. 30 capsule 3  . estradiol (ESTRACE) 0.1 MG/GM vaginal cream Use 1/2 g vaginally three times a week. 42.5 g 1  . fexofenadine  (ALLEGRA) 180 MG tablet Take 180 mg by mouth daily as needed for allergies.     Marland Kitchen GARLIC PO Take 1 capsule by mouth daily.    . Omega-3 Fatty Acids (FISH OIL) 1000 MG CAPS Take 1,000 mg by mouth daily.    Marland Kitchen omeprazole (PRILOSEC) 40 MG capsule TAKE 1 CAPSULE BY MOUTH ONCE DAILY 90 capsule 3  . ondansetron (ZOFRAN) 8 MG tablet TAKE ONE TABLET BY MOUTH EVERY 8 HOURS AS NEEDED FOR NAUSEA AND VOMITING 90 tablet 0  . ranitidine (ZANTAC) 150 MG tablet Take 1 tablet (150 mg total) by mouth at bedtime.    . ALPRAZolam (XANAX) 0.5 MG tablet TAKE 1 TABLET BY MOUTH AT BEDTIME AS NEEDED FOR ANXIETY 40 tablet 0  . cephALEXin (KEFLEX) 250 MG capsule Take 250 mg by mouth daily.  99  . ibandronate (BONIVA) 150 MG tablet Take 1 tablet (150 mg total) by mouth every 30 (thirty) days. Take in the morning with a full glass of water, on an empty stomach, and do not take anything else by mouth or lie down for the next 30 min.    . triamcinolone cream (KENALOG) 0.1 % Apply 1 application topically 2 (two) times daily. Apply to AA. Limit to 2 weeks duration 30 g 0   No facility-administered medications prior to visit.      Per  HPI unless specifically indicated in ROS section below Review of Systems     Objective:    BP 130/64 (BP Location: Left Arm, Patient Position: Sitting, Cuff Size: Normal)   Pulse 62   Temp 97.9 F (36.6 C) (Oral)   Ht 5' 3.75" (1.619 m)   Wt 142 lb 12 oz (64.8 kg)   LMP 10/20/1979 (Within Years)   SpO2 96%   BMI 24.70 kg/m   Wt Readings from Last 3 Encounters:  07/01/18 142 lb 12 oz (64.8 kg)  06/27/18 140 lb (63.5 kg)  05/10/18 144 lb 6.4 oz (65.5 kg)    Physical Exam  Constitutional: She appears well-developed and well-nourished. No distress.  HENT:  Mouth/Throat: Oropharynx is clear and moist. No oropharyngeal exudate.  Cardiovascular: Normal rate, regular rhythm and normal heart sounds.  No murmur heard. Pulmonary/Chest: Effort normal and breath sounds normal. No respiratory  distress. She has no wheezes. She has no rales.  Abdominal: Soft. Bowel sounds are normal. She exhibits no distension and no mass. There is no tenderness. There is no rebound and no guarding. No hernia.  Musculoskeletal: She exhibits no edema.  Nursing note and vitals reviewed.  Results for orders placed or performed during the hospital encounter of 06/27/18  Gastrointestinal Panel by PCR , Stool  Result Value Ref Range   Campylobacter species NOT DETECTED NOT DETECTED   Plesimonas shigelloides NOT DETECTED NOT DETECTED   Salmonella species NOT DETECTED NOT DETECTED   Yersinia enterocolitica NOT DETECTED NOT DETECTED   Vibrio species NOT DETECTED NOT DETECTED   Vibrio cholerae NOT DETECTED NOT DETECTED   Enteroaggregative E coli (EAEC) NOT DETECTED NOT DETECTED   Enteropathogenic E coli (EPEC) NOT DETECTED NOT DETECTED   Enterotoxigenic E coli (ETEC) NOT DETECTED NOT DETECTED   Shiga like toxin producing E coli (STEC) NOT DETECTED NOT DETECTED   Shigella/Enteroinvasive E coli (EIEC) NOT DETECTED NOT DETECTED   Cryptosporidium NOT DETECTED NOT DETECTED   Cyclospora cayetanensis NOT DETECTED NOT DETECTED   Entamoeba histolytica NOT DETECTED NOT DETECTED   Giardia lamblia NOT DETECTED NOT DETECTED   Adenovirus F40/41 NOT DETECTED NOT DETECTED   Astrovirus NOT DETECTED NOT DETECTED   Norovirus GI/GII NOT DETECTED NOT DETECTED   Rotavirus A NOT DETECTED NOT DETECTED   Sapovirus (I, II, IV, and V) NOT DETECTED NOT DETECTED  C difficile quick scan w PCR reflex  Result Value Ref Range   C Diff antigen NEGATIVE NEGATIVE   C Diff toxin NEGATIVE NEGATIVE   C Diff interpretation No C. difficile detected.   Lipase, blood  Result Value Ref Range   Lipase 28 11 - 51 U/L  Comprehensive metabolic panel  Result Value Ref Range   Sodium 137 135 - 145 mmol/L   Potassium 3.3 (L) 3.5 - 5.1 mmol/L   Chloride 104 98 - 111 mmol/L   CO2 25 22 - 32 mmol/L   Glucose, Bld 116 (H) 70 - 99 mg/dL   BUN 13  8 - 23 mg/dL   Creatinine, Ser 0.86 0.44 - 1.00 mg/dL   Calcium 8.7 (L) 8.9 - 10.3 mg/dL   Total Protein 7.0 6.5 - 8.1 g/dL   Albumin 3.6 3.5 - 5.0 g/dL   AST 20 15 - 41 U/L   ALT 17 0 - 44 U/L   Alkaline Phosphatase 96 38 - 126 U/L   Total Bilirubin 0.6 0.3 - 1.2 mg/dL   GFR calc non Af Amer >60 >60 mL/min   GFR calc Af  Amer >60 >60 mL/min   Anion gap 8 5 - 15  CBC  Result Value Ref Range   WBC 8.0 3.6 - 11.0 K/uL   RBC 4.81 3.80 - 5.20 MIL/uL   Hemoglobin 13.2 12.0 - 16.0 g/dL   HCT 38.5 35.0 - 47.0 %   MCV 80.1 80.0 - 100.0 fL   MCH 27.4 26.0 - 34.0 pg   MCHC 34.2 32.0 - 36.0 g/dL   RDW 13.9 11.5 - 14.5 %   Platelets 185 150 - 440 K/uL  Urinalysis, Complete w Microscopic  Result Value Ref Range   Color, Urine YELLOW (A) YELLOW   APPearance HAZY (A) CLEAR   Specific Gravity, Urine 1.020 1.005 - 1.030   pH 5.0 5.0 - 8.0   Glucose, UA NEGATIVE NEGATIVE mg/dL   Hgb urine dipstick SMALL (A) NEGATIVE   Bilirubin Urine NEGATIVE NEGATIVE   Ketones, ur NEGATIVE NEGATIVE mg/dL   Protein, ur NEGATIVE NEGATIVE mg/dL   Nitrite NEGATIVE NEGATIVE   Leukocytes, UA SMALL (A) NEGATIVE   RBC / HPF 0-5 0 - 5 RBC/hpf   WBC, UA 21-50 0 - 5 WBC/hpf   Bacteria, UA NONE SEEN NONE SEEN   Squamous Epithelial / LPF 0-5 0 - 5   Mucus PRESENT    Hyaline Casts, UA PRESENT       Assessment & Plan:   Problem List Items Addressed This Visit    Recurrent UTI    Has established with Dr Amalia Hailey.  Did not tolerate elmiron.       Relevant Medications   cephALEXin (KEFLEX) 250 MG capsule   Osteopenia   Diarrhea - Primary    Most likely med related - boniva. Will avoid oral bisphosphonates from here on out. Infectious evaluation was normal at ER. Illness largely resolving now.           Meds ordered this encounter  Medications  . ALPRAZolam (XANAX) 0.5 MG tablet    Sig: TAKE 1 TABLET BY MOUTH AT BEDTIME AS NEEDED FOR ANXIETY    Dispense:  40 tablet    Refill:  0   No orders of the  defined types were placed in this encounter.   Follow up plan: Return if symptoms worsen or fail to improve.  Ria Bush, MD

## 2018-07-02 NOTE — Addendum Note (Signed)
Addended by: Ria Bush on: 07/02/2018 10:10 AM   Modules accepted: Level of Service

## 2018-07-04 ENCOUNTER — Other Ambulatory Visit: Payer: Self-pay | Admitting: Family Medicine

## 2018-07-08 ENCOUNTER — Telehealth: Payer: Self-pay | Admitting: *Deleted

## 2018-07-08 NOTE — Telephone Encounter (Signed)
Spoke with patient.  1. Advised as seen below. Patient states she has some estrace cream left, does not desire an alternative, will pay out of pocket. Has current RX for estrace vaginal cream.  Has tried compounded in the past. Letter to scan.  2. Update for Dr. Quincy Simmonds. States Dr. Amalia Hailey started her on a daily abx, burning and stinging has resolved, feels much better, has f/u on 10/22.   Patient thankful for call.   Routing to provider for final review. Patient is agreeable to disposition. Will close encounter.

## 2018-07-08 NOTE — Telephone Encounter (Signed)
Left message to call Sharee Pimple at [phone].  Letter from [employer] Patient Assistance Program received for estrace dated 06/21/18, unable to provide medication under program. Per review of Epic [facility] has notified patient.   Does patient need an alternative medication? Did she pick up Rx?

## 2018-07-19 ENCOUNTER — Other Ambulatory Visit: Payer: Self-pay | Admitting: Obstetrics and Gynecology

## 2018-07-19 DIAGNOSIS — Z1231 Encounter for screening mammogram for malignant neoplasm of breast: Secondary | ICD-10-CM

## 2018-07-25 DIAGNOSIS — C44529 Squamous cell carcinoma of skin of other part of trunk: Secondary | ICD-10-CM | POA: Diagnosis not present

## 2018-07-25 DIAGNOSIS — Z85828 Personal history of other malignant neoplasm of skin: Secondary | ICD-10-CM | POA: Diagnosis not present

## 2018-07-27 ENCOUNTER — Encounter: Payer: Self-pay | Admitting: Internal Medicine

## 2018-07-27 ENCOUNTER — Ambulatory Visit (INDEPENDENT_AMBULATORY_CARE_PROVIDER_SITE_OTHER): Payer: PPO | Admitting: Internal Medicine

## 2018-07-27 VITALS — BP 144/84 | HR 59 | Temp 97.7°F | Wt 142.0 lb

## 2018-07-27 DIAGNOSIS — H9202 Otalgia, left ear: Secondary | ICD-10-CM | POA: Diagnosis not present

## 2018-07-27 MED ORDER — NAPROXEN 375 MG PO TABS: 375.0000 mg | ORAL_TABLET | Freq: Three times a day (TID) | ORAL | 0 refills | Status: DC

## 2018-07-27 NOTE — Patient Instructions (Signed)
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
## 2018-07-29 ENCOUNTER — Encounter: Payer: Self-pay | Admitting: Family Medicine

## 2018-07-29 ENCOUNTER — Ambulatory Visit (INDEPENDENT_AMBULATORY_CARE_PROVIDER_SITE_OTHER): Payer: PPO | Admitting: Family Medicine

## 2018-07-29 VITALS — BP 134/64 | HR 63 | Temp 97.5°F | Wt 142.0 lb

## 2018-07-29 DIAGNOSIS — G518 Other disorders of facial nerve: Secondary | ICD-10-CM

## 2018-07-29 MED ORDER — GABAPENTIN 100 MG PO CAPS: 100.0000 mg | ORAL_CAPSULE | Freq: Three times a day (TID) | ORAL | 0 refills | Status: DC

## 2018-07-29 MED ORDER — NAPROXEN 375 MG PO TABS: 375.0000 mg | ORAL_TABLET | Freq: Two times a day (BID) | ORAL | 0 refills | Status: DC

## 2018-07-29 NOTE — Patient Instructions (Signed)
Sounds like irritation of nerve of left face.  Treat with continued naprosyn 375mg  twice daily with meals as needed, may add gabapentin nerve pain pill 100mg  at night first, if tolerated may taper up to three times daily as needed.  Caution with anything too strong to face.

## 2018-07-29 NOTE — Progress Notes (Signed)
BP 134/64   Pulse 63   Temp (!) 97.5 F (36.4 C) (Oral)   Wt 142 lb (64.4 kg)   LMP 10/20/1979 (Within Years)   SpO2 96%   BMI 24.57 kg/m    CC: L ear pain Subjective:    Patient ID: Bianca Fry, female    DOB: 07-16-1946, 72 y.o.   MRN: [id number]  HPI: Bianca Fry is a 72 y.o. female presenting on 07/29/2018 for Follow-up (ear pain)   L facial pain started 07/27/2018.  Saw Regina on Wednesday with L ear pain, note reviewed.  Recent dental work 9/30 - crown placed upper left molars.  Treated with naprosyn 375mg  tid with food - this helps for several hours at a time.   Persistent pain - did well last night, but shooting pains in ear started again this morning when she woke up. Pain starts at pinna, travels posterior to ear. Describes electrical shock. "Face feels funny" paresthesias of left cheek.   No fevers, chills, skin changes, rash, oral lesions.   Thinking about switching dentist.   Relevant past medical, surgical, family and social history reviewed and updated as indicated. Interim medical history since our last visit reviewed. Allergies and medications reviewed and updated. Outpatient Medications Prior to Visit  Medication Sig Dispense Refill  . ALPRAZolam (XANAX) 0.5 MG tablet TAKE 1 TABLET BY MOUTH AT BEDTIME AS NEEDED FOR ANXIETY 40 tablet 0  . aspirin EC 81 MG EC tablet Take 1 tablet (81 mg total) by mouth daily. 30 tablet 2  . atenolol (TENORMIN) 25 MG tablet TAKE 1 TABLET BY MOUTH ONCE DAILY 90 tablet 3  . atorvastatin (LIPITOR) 20 MG tablet TAKE 1 TABLET BY MOUTH TWICE A WEEK ON  MONDAY  AND  FRIDAY 24 tablet 1  . Calcium Carb-Cholecalciferol (CALCIUM-VITAMIN D) 600-400 MG-UNIT TABS Take 1 tablet by mouth daily.    . cephALEXin (KEFLEX) 250 MG capsule Take 250 mg by mouth daily.  99  . Cholecalciferol (VITAMIN D3 PO) Take by mouth.    . dicyclomine (BENTYL) 10 MG capsule TAKE ONE CAPSULE BY MOUTH TWICE DAILY AS NEEDED FOR SPASMS. 30 capsule 3  . estradiol  (ESTRACE) 0.1 MG/GM vaginal cream Use 1/2 g vaginally three times a week. 42.5 g 1  . fexofenadine (ALLEGRA) 180 MG tablet Take 180 mg by mouth daily as needed for allergies.     Marland Kitchen GARLIC PO Take 1 capsule by mouth daily.    . Omega-3 Fatty Acids (FISH OIL) 1000 MG CAPS Take 1,000 mg by mouth daily.    Marland Kitchen omeprazole (PRILOSEC) 40 MG capsule TAKE 1 CAPSULE BY MOUTH ONCE DAILY 90 capsule 3  . ondansetron (ZOFRAN) 8 MG tablet TAKE ONE TABLET BY MOUTH EVERY 8 HOURS AS NEEDED FOR NAUSEA AND VOMITING 90 tablet 0  . ranitidine (ZANTAC) 150 MG tablet Take 1 tablet (150 mg total) by mouth at bedtime.    . naproxen (NAPROSYN) 375 MG tablet Take 1 tablet (375 mg total) by mouth 3 (three) times daily with meals. 15 tablet 0   No facility-administered medications prior to visit.      Per HPI unless specifically indicated in ROS section below Review of Systems     Objective:    BP 134/64   Pulse 63   Temp (!) 97.5 F (36.4 C) (Oral)   Wt 142 lb (64.4 kg)   LMP 10/20/1979 (Within Years)   SpO2 96%   BMI 24.57 kg/m   Wt Readings from  Last 3 Encounters:  07/29/18 142 lb (64.4 kg)  07/27/18 142 lb (64.4 kg)  07/01/18 142 lb 12 oz (64.8 kg)    Physical Exam  Constitutional: She appears well-developed and well-nourished. No distress.  HENT:  Head: Normocephalic and atraumatic.  Right Ear: Hearing, tympanic membrane, external ear and ear canal normal.  Left Ear: Hearing, tympanic membrane, external ear and ear canal normal.  Mouth/Throat: Uvula is midline, oropharynx is clear and moist and mucous membranes are normal. No oropharyngeal exudate, posterior oropharyngeal edema, posterior oropharyngeal erythema or tonsillar abscesses.  Discomfort with palpation of L pinna without erythema or swelling or rash Crepitus at TMJ bilaterally but no reproducible pain  Neck: Normal range of motion. Neck supple.  Musculoskeletal: She exhibits no edema.  Lymphadenopathy:    She has no cervical adenopathy.    Skin: Skin is warm and dry. No rash noted.  No vesicular rash present  Nursing note and vitals reviewed.     Assessment & Plan:   Problem List Items Addressed This Visit    Facial neuralgia - Primary    Describes neuralgia/neuritis symptoms to L ear, no rash present suggestive of shingles. Ear canal clear. Anticipate irritant neuralgia, possibly after recent dental work. Continue naprosyn 375mg  BID AC, will add low dose gabapentin 100mg  with slow titration as tolerated. Update with effect.           Meds ordered this encounter  Medications  . naproxen (NAPROSYN) 375 MG tablet    Sig: Take 1 tablet (375 mg total) by mouth 2 (two) times daily with a meal.    Dispense:  30 tablet    Refill:  0  . gabapentin (NEURONTIN) 100 MG capsule    Sig: Take 1 capsule (100 mg total) by mouth 3 (three) times daily.    Dispense:  60 capsule    Refill:  0   No orders of the defined types were placed in this encounter.   Follow up plan: Return if symptoms worsen or fail to improve.  Ria Bush, MD

## 2018-07-29 NOTE — Assessment & Plan Note (Signed)
Describes neuralgia/neuritis symptoms to L ear, no rash present suggestive of shingles. Ear canal clear. Anticipate irritant neuralgia, possibly after recent dental work. Continue naprosyn 375mg  BID AC, will add low dose gabapentin 100mg  with slow titration as tolerated. Update with effect.

## 2018-08-04 ENCOUNTER — Ambulatory Visit: Payer: PPO | Admitting: Obstetrics and Gynecology

## 2018-08-09 DIAGNOSIS — R399 Unspecified symptoms and signs involving the genitourinary system: Secondary | ICD-10-CM | POA: Diagnosis not present

## 2018-08-09 DIAGNOSIS — Z87440 Personal history of urinary (tract) infections: Secondary | ICD-10-CM | POA: Diagnosis not present

## 2018-08-18 ENCOUNTER — Ambulatory Visit
Admission: RE | Admit: 2018-08-18 | Discharge: 2018-08-18 | Disposition: A | Discharge status: Home / Self Care | Payer: PPO | Source: Ambulatory Visit | Attending: Obstetrics and Gynecology | Admitting: Obstetrics and Gynecology

## 2018-08-18 DIAGNOSIS — Z1231 Encounter for screening mammogram for malignant neoplasm of breast: Secondary | ICD-10-CM

## 2018-08-19 ENCOUNTER — Other Ambulatory Visit: Payer: Self-pay | Admitting: Obstetrics and Gynecology

## 2018-08-19 DIAGNOSIS — R928 Other abnormal and inconclusive findings on diagnostic imaging of breast: Secondary | ICD-10-CM
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
## 2018-08-22 ENCOUNTER — Other Ambulatory Visit: Payer: Self-pay

## 2018-08-22 ENCOUNTER — Encounter: Payer: Self-pay | Admitting: Obstetrics and Gynecology

## 2018-08-22 ENCOUNTER — Ambulatory Visit (INDEPENDENT_AMBULATORY_CARE_PROVIDER_SITE_OTHER): Payer: PPO | Admitting: Obstetrics and Gynecology

## 2018-08-22 VITALS — BP 134/70 | HR 66 | Ht 64.0 in | Wt 144.0 lb

## 2018-08-22 DIAGNOSIS — R928 Other abnormal and inconclusive findings on diagnostic imaging of breast: Secondary | ICD-10-CM | POA: Diagnosis not present

## 2018-08-22 DIAGNOSIS — Z87440 Personal history of urinary (tract) infections: Secondary | ICD-10-CM

## 2018-08-22 DIAGNOSIS — M8580 Other specified disorders of bone density and structure, unspecified site: Secondary | ICD-10-CM | POA: Diagnosis not present

## 2018-08-22 DIAGNOSIS — N952 Postmenopausal atrophic vaginitis: Secondary | ICD-10-CM

## 2018-08-22 MED ORDER — ESTRADIOL 0.1 MG/GM VA CREA: TOPICAL_CREAM | VAGINAL | 1 refills | Status: DC

## 2018-08-23 ENCOUNTER — Ambulatory Visit
Admission: RE | Admit: 2018-08-23 | Discharge: 2018-08-23 | Disposition: A | Discharge status: Home / Self Care | Payer: PPO | Source: Ambulatory Visit | Attending: Obstetrics and Gynecology | Admitting: Obstetrics and Gynecology

## 2018-08-23 ENCOUNTER — Ambulatory Visit: Payer: PPO

## 2018-08-23 DIAGNOSIS — R928 Other abnormal and inconclusive findings on diagnostic imaging of breast: Secondary | ICD-10-CM

## 2018-09-12 ENCOUNTER — Other Ambulatory Visit: Payer: Self-pay | Admitting: Family Medicine

## 2018-09-12 NOTE — Telephone Encounter (Signed)
Name of Medication: Xanax Name of Pharmacy: [facility] Last Fill or Written Date and Quantity: 07/01/18, #40/0 Last Office Visit and Type: 07/29/18, acute Next Office Visit and Type: 05/11/19, CPE Last Controlled Substance Agreement Date: 04/16/17 Last UDS: 04/16/17  Bentyl Last filled:  04/02/18, #30/3

## 2018-09-13 NOTE — Telephone Encounter (Signed)
Eprescribed.

## 2018-10-03 ENCOUNTER — Other Ambulatory Visit: Payer: Self-pay | Admitting: Family Medicine

## 2018-10-10 DIAGNOSIS — H9319 Tinnitus, unspecified ear: Secondary | ICD-10-CM | POA: Diagnosis not present

## 2018-10-10 DIAGNOSIS — H6121 Impacted cerumen, right ear: Secondary | ICD-10-CM | POA: Diagnosis not present

## 2018-11-02 DIAGNOSIS — D485 Neoplasm of uncertain behavior of skin: Secondary | ICD-10-CM | POA: Diagnosis not present

## 2018-11-02 DIAGNOSIS — Z85828 Personal history of other malignant neoplasm of skin: Secondary | ICD-10-CM | POA: Diagnosis not present

## 2018-11-02 DIAGNOSIS — D692 Other nonthrombocytopenic purpura: Secondary | ICD-10-CM | POA: Diagnosis not present

## 2018-11-02 DIAGNOSIS — B0089 Other herpesviral infection: Secondary | ICD-10-CM | POA: Diagnosis not present

## 2018-11-02 DIAGNOSIS — L821 Other seborrheic keratosis: Secondary | ICD-10-CM | POA: Diagnosis not present

## 2018-11-02 DIAGNOSIS — L820 Inflamed seborrheic keratosis: Secondary | ICD-10-CM | POA: Diagnosis not present

## 2018-11-12 ENCOUNTER — Other Ambulatory Visit: Payer: Self-pay | Admitting: Family Medicine

## 2018-11-12 NOTE — Telephone Encounter (Signed)
Eprescribed.

## 2018-12-14 DIAGNOSIS — H02051 Trichiasis without entropian right upper eyelid: Secondary | ICD-10-CM | POA: Diagnosis not present

## 2019-01-09 ENCOUNTER — Other Ambulatory Visit: Payer: Self-pay | Admitting: Family Medicine

## 2019-01-09 NOTE — Telephone Encounter (Signed)
Last refill on Zofran was on 04/22/2018 for #90 with 0 refill. Last appointment was on 07/29/18 for acute. Future appointment on 05/15/2019

## 2019-01-30 ENCOUNTER — Other Ambulatory Visit: Payer: Self-pay | Admitting: Family Medicine

## 2019-01-30 NOTE — Telephone Encounter (Signed)
Name of Medication: Xanax Name of Pharmacy: [facility] [address] Last Fill or Written Date and Quantity: 11/12/18, #40/0 Last Office Visit and Type: 07/29/18, acute Next Office Visit and Type: 05/11/19, CPE Last Controlled Substance Agreement Date: 04/16/17 Last UDS: 04/16/17

## 2019-01-31 NOTE — Telephone Encounter (Signed)
Eprescribed.

## 2019-02-15 ENCOUNTER — Ambulatory Visit: Payer: PPO | Admitting: Obstetrics and Gynecology

## 2019-03-27 ENCOUNTER — Other Ambulatory Visit: Payer: Self-pay | Admitting: Family Medicine

## 2019-04-11 ENCOUNTER — Other Ambulatory Visit: Payer: Self-pay | Admitting: Family Medicine

## 2019-04-11 ENCOUNTER — Ambulatory Visit (INDEPENDENT_AMBULATORY_CARE_PROVIDER_SITE_OTHER): Payer: PPO | Admitting: Obstetrics and Gynecology

## 2019-04-11 ENCOUNTER — Encounter: Payer: Self-pay | Admitting: Obstetrics and Gynecology

## 2019-04-11 ENCOUNTER — Other Ambulatory Visit: Payer: Self-pay

## 2019-04-11 VITALS — BP 118/62 | HR 68 | Temp 97.4°F | Resp 12 | Ht 64.25 in | Wt 147.6 lb

## 2019-04-11 DIAGNOSIS — M8580 Other specified disorders of bone density and structure, unspecified site: Secondary | ICD-10-CM

## 2019-04-11 DIAGNOSIS — Z01419 Encounter for gynecological examination (general) (routine) without abnormal findings: Secondary | ICD-10-CM | POA: Diagnosis not present

## 2019-04-11 DIAGNOSIS — Z780 Asymptomatic menopausal state: Secondary | ICD-10-CM | POA: Diagnosis not present

## 2019-04-11 NOTE — Telephone Encounter (Signed)
Name of Medication:Xanax Name of [place] or Written Date and Quantity:01/31/19, #40tabs with 0 refills Last Office Visit and Type:07/29/18, acute Next Office Visit and Type:05/11/19, CPE Last Controlled Substance Agreement Date:04/16/17 Last UDS:04/16/17

## 2019-04-11 NOTE — Patient Instructions (Signed)
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
## 2019-04-13 ENCOUNTER — Other Ambulatory Visit: Payer: Self-pay | Admitting: Family Medicine

## 2019-04-13 NOTE — Telephone Encounter (Signed)
already refilled

## 2019-04-13 NOTE — Telephone Encounter (Signed)
Last office visit 07/29/2018 for facial neuralgia. Last refilled 01/31/2019 for #40 with no refills.  CPE scheduled for 05/11/2019.

## 2019-04-13 NOTE — Telephone Encounter (Signed)
Eprescribed.

## 2019-05-04 ENCOUNTER — Telehealth: Payer: Self-pay | Admitting: Family Medicine

## 2019-05-04 DIAGNOSIS — I10 Essential (primary) hypertension: Secondary | ICD-10-CM

## 2019-05-04 DIAGNOSIS — D509 Iron deficiency anemia, unspecified: Secondary | ICD-10-CM

## 2019-05-04 DIAGNOSIS — R7303 Prediabetes: Secondary | ICD-10-CM

## 2019-05-04 DIAGNOSIS — E785 Hyperlipidemia, unspecified: Secondary | ICD-10-CM

## 2019-05-04 DIAGNOSIS — E559 Vitamin D deficiency, unspecified: Secondary | ICD-10-CM

## 2019-05-04 NOTE — Telephone Encounter (Signed)
Labs ordered.

## 2019-05-04 NOTE — Telephone Encounter (Signed)
Pt needs labs ordered for AMV 05/09/19

## 2019-05-05 ENCOUNTER — Ambulatory Visit (INDEPENDENT_AMBULATORY_CARE_PROVIDER_SITE_OTHER): Payer: PPO

## 2019-05-05 VITALS — Ht 64.25 in | Wt 145.0 lb

## 2019-05-05 DIAGNOSIS — Z0000 Encounter for general adult medical examination without abnormal findings: Secondary | ICD-10-CM

## 2019-05-05 NOTE — Progress Notes (Signed)
PCP notes:  Health Maintenance:  DEXA scan ordered 04/11/2019. States will get in October with mammogram  Abnormal Screenings:  None  Patient concerns:  Has been having left hip pain at night. Wonders if it could be coming from her back. Feet have been swelling for about a month  Nurse concerns:  New swelling of feet  Next PCP appt.: 05/11/2019 at 9:30
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
## 2019-05-05 NOTE — Patient Instructions (Signed)
Bianca Fry , Thank you for taking time to come for your Medicare Wellness Visit. I appreciate your ongoing commitment to your health goals. Please review the following plan we discussed and let me know if I can assist you in the future.   Screening recommendations/referrals: Colonoscopy: 02/2015 Mammogram: 07/2018 Bone Density: ordered 04/11/2019 Recommended yearly ophthalmology/optometry visit for glaucoma screening and checkup Recommended yearly dental visit for hygiene and checkup  Vaccinations: Influenza vaccine: 07/2017 Pneumococcal vaccine: 03/2016 Tdap vaccine: 03/2011 Shingles vaccine: discussed    Advanced directives: Advance directive discussed with you today.  Conditions/risks identified: swollen feet and hip pain  Next appointment: 05/11/2019 at 9:30   Preventive Care 74 Years and Older, Female Preventive care refers to lifestyle choices and visits with your health care provider that can promote health and wellness. What does preventive care include?  A yearly physical exam. This is also called an annual well check.  Dental exams once or twice a year.  Routine eye exams. Ask your health care provider how often you should have your eyes checked.  Personal lifestyle choices, including:  Daily care of your teeth and gums.  Regular physical activity.  Eating a healthy diet.  Avoiding tobacco and drug use.  Limiting alcohol use.  Practicing safe sex.  Taking low-dose aspirin every day.  Taking vitamin and mineral supplements as recommended by your health care provider. What happens during an annual well check? The services and screenings done by your health care provider during your annual well check will depend on your age, overall health, lifestyle risk factors, and family history of disease. Counseling  Your health care provider may ask you questions about your:  Alcohol use.  Tobacco use.  Drug use.  Emotional well-being.  Home and relationship  well-being.  Sexual activity.  Eating habits.  History of falls.  Memory and ability to understand (cognition).  Work and work Statistician.  Reproductive health. Screening  You may have the following tests or measurements:  Height, weight, and BMI.  Blood pressure.  Lipid and cholesterol levels. These may be checked every 5 years, or more frequently if you are over 51 years old.  Skin check.  Lung cancer screening. You may have this screening every year starting at age 36 if you have a 30-pack-year history of smoking and currently smoke or have quit within the past 15 years.  Fecal occult blood test (FOBT) of the stool. You may have this test every year starting at age 74.  Flexible sigmoidoscopy or colonoscopy. You may have a sigmoidoscopy every 5 years or a colonoscopy every 10 years starting at age 74.  Hepatitis C blood test.  Hepatitis B blood test.  Sexually transmitted disease (STD) testing.  Diabetes screening. This is done by checking your blood sugar (glucose) after you have not eaten for a while (fasting). You may have this done every 1-3 years.  Bone density scan. This is done to screen for osteoporosis. You may have this done starting at age 74.  Mammogram. This may be done every 1-2 years. Talk to your health care provider about how often you should have regular mammograms. Talk with your health care provider about your test results, treatment options, and if necessary, the need for more tests. Vaccines  Your health care provider may recommend certain vaccines, such as:  Influenza vaccine. This is recommended every year.  Tetanus, diphtheria, and acellular pertussis (Tdap, Td) vaccine. You may need a Td booster every 10 years.  Zoster vaccine. You may  need this after age 41.  Pneumococcal 13-valent conjugate (PCV13) vaccine. One dose is recommended after age 81.  Pneumococcal polysaccharide (PPSV23) vaccine. One dose is recommended after age 74.  Talk to your health care provider about which screenings and vaccines you need and how often you need them. This information is not intended to replace advice given to you by your health care provider. Make sure you discuss any questions you have with your health care provider. Document Released: 11/01/2015 Document Revised: 06/24/2016 Document Reviewed: 08/06/2015 Elsevier Interactive Patient Education  2017 [employer] Prevention in the Home Falls can cause injuries. They can happen to people of all ages. There are many things you can do to make your home safe and to help prevent falls. What can I do on the outside of my home?  Regularly fix the edges of walkways and driveways and fix any cracks.  Remove anything that might make you trip as you walk through a door, such as a raised step or threshold.  Trim any bushes or trees on the path to your home.  Use bright outdoor lighting.  Clear any walking paths of anything that might make someone trip, such as rocks or tools.  Regularly check to see if handrails are loose or broken. Make sure that both sides of any steps have handrails.  Any raised decks and porches should have guardrails on the edges.  Have any leaves, snow, or ice cleared regularly.  Use sand or salt on walking paths during winter.  Clean up any spills in your garage right away. This includes oil or grease spills. What can I do in the bathroom?  Use night lights.  Install grab bars by the toilet and in the tub and shower. Do not use towel bars as grab bars.  Use non-skid mats or decals in the tub or shower.  If you need to sit down in the shower, use a plastic, non-slip stool.  Keep the floor dry. Clean up any water that spills on the floor as soon as it happens.  Remove soap buildup in the tub or shower regularly.  Attach bath mats securely with double-sided non-slip rug tape.  Do not have throw rugs and other things on the floor that can make you  trip. What can I do in the bedroom?  Use night lights.  Make sure that you have a light by your bed that is easy to reach.  Do not use any sheets or blankets that are too big for your bed. They should not hang down onto the floor.  Have a firm chair that has side arms. You can use this for support while you get dressed.  Do not have throw rugs and other things on the floor that can make you trip. What can I do in the kitchen?  Clean up any spills right away.  Avoid walking on wet floors.  Keep items that you use a lot in easy-to-reach places.  If you need to reach something above you, use a strong step stool that has a grab bar.  Keep electrical cords out of the way.  Do not use floor polish or wax that makes floors slippery. If you must use wax, use non-skid floor wax.  Do not have throw rugs and other things on the floor that can make you trip. What can I do with my stairs?  Do not leave any items on the stairs.  Make sure that there are handrails on both sides  of the stairs and use them. Fix handrails that are broken or loose. Make sure that handrails are as long as the stairways.  Check any carpeting to make sure that it is firmly attached to the stairs. Fix any carpet that is loose or worn.  Avoid having throw rugs at the top or bottom of the stairs. If you do have throw rugs, attach them to the floor with carpet tape.  Make sure that you have a light switch at the top of the stairs and the bottom of the stairs. If you do not have them, ask someone to add them for you. What else can I do to help prevent falls?  Wear shoes that:  Do not have high heels.  Have rubber bottoms.  Are comfortable and fit you well.  Are closed at the toe. Do not wear sandals.  If you use a stepladder:  Make sure that it is fully opened. Do not climb a closed stepladder.  Make sure that both sides of the stepladder are locked into place.  Ask someone to hold it for you, if  possible.  Clearly mark and make sure that you can see:  Any grab bars or handrails.  First and last steps.  Where the edge of each step is.  Use tools that help you move around (mobility aids) if they are needed. These include:  Canes.  Walkers.  Scooters.  Crutches.  Turn on the lights when you go into a dark area. Replace any light bulbs as soon as they burn out.  Set up your furniture so you have a clear path. Avoid moving your furniture around.  If any of your floors are uneven, fix them.  If there are any pets around you, be aware of where they are.  Review your medicines with your doctor. Some medicines can make you feel dizzy. This can increase your chance of falling. Ask your doctor what other things that you can do to help prevent falls. This information is not intended to replace advice given to you by your health care provider. Make sure you discuss any questions you have with your health care provider. Document Released: 08/01/2009 Document Revised: 03/12/2016 Document Reviewed: 11/09/2014 Elsevier Interactive Patient Education  2017 [employer].

## 2019-05-09 ENCOUNTER — Other Ambulatory Visit (INDEPENDENT_AMBULATORY_CARE_PROVIDER_SITE_OTHER): Payer: PPO

## 2019-05-09 ENCOUNTER — Ambulatory Visit: Payer: PPO

## 2019-05-09 DIAGNOSIS — R7303 Prediabetes: Secondary | ICD-10-CM

## 2019-05-09 DIAGNOSIS — E559 Vitamin D deficiency, unspecified: Secondary | ICD-10-CM

## 2019-05-09 DIAGNOSIS — D509 Iron deficiency anemia, unspecified: Secondary | ICD-10-CM | POA: Diagnosis not present

## 2019-05-09 DIAGNOSIS — E785 Hyperlipidemia, unspecified: Secondary | ICD-10-CM | POA: Diagnosis not present

## 2019-05-09 DIAGNOSIS — I10 Essential (primary) hypertension: Secondary | ICD-10-CM

## 2019-05-09 LAB — CBC WITH DIFFERENTIAL/PLATELET
Basophils Absolute: 0 10*3/uL (ref 0.0–0.1)
Basophils Relative: 0.3 % (ref 0.0–3.0)
Eosinophils Absolute: 0.1 10*3/uL (ref 0.0–0.7)
Eosinophils Relative: 2.2 % (ref 0.0–5.0)
HCT: 36.6 % (ref 36.0–46.0)
Hemoglobin: 11.8 g/dL — ABNORMAL LOW (ref 12.0–15.0)
Lymphocytes Relative: 31.3 % (ref 12.0–46.0)
Lymphs Abs: 2.1 10*3/uL (ref 0.7–4.0)
MCHC: 32.1 g/dL (ref 30.0–36.0)
MCV: 83.1 fl (ref 78.0–100.0)
Monocytes Absolute: 0.6 10*3/uL (ref 0.1–1.0)
Monocytes Relative: 8.9 % (ref 3.0–12.0)
Neutro Abs: 3.9 10*3/uL (ref 1.4–7.7)
Neutrophils Relative %: 57.3 % (ref 43.0–77.0)
Platelets: 154 10*3/uL (ref 150.0–400.0)
RBC: 4.41 Mil/uL (ref 3.87–5.11)
RDW: 15 % (ref 11.5–15.5)
WBC: 6.8 10*3/uL (ref 4.0–10.5)

## 2019-05-09 LAB — COMPREHENSIVE METABOLIC PANEL
ALT: 11 U/L (ref 0–35)
AST: 15 U/L (ref 0–37)
Albumin: 4 g/dL (ref 3.5–5.2)
Alkaline Phosphatase: 109 U/L (ref 39–117)
BUN: 11 mg/dL (ref 6–23)
CO2: 26 mEq/L (ref 19–32)
Calcium: 9 mg/dL (ref 8.4–10.5)
Chloride: 104 mEq/L (ref 96–112)
Creatinine, Ser: 0.89 mg/dL (ref 0.40–1.20)
GFR: 61.94 mL/min (ref >60.00)
Glucose, Bld: 103 mg/dL — ABNORMAL HIGH (ref 70–99)
Potassium: 4.2 mEq/L (ref 3.5–5.1)
Sodium: 141 mEq/L (ref 135–145)
Total Bilirubin: 0.5 mg/dL (ref 0.2–1.2)
Total Protein: 6.6 g/dL (ref 6.0–8.3)

## 2019-05-09 LAB — VITAMIN D 25 HYDROXY (VIT D DEFICIENCY, FRACTURES): VITD: 39.2 ng/mL (ref 30.00–100.00)

## 2019-05-09 LAB — HEMOGLOBIN A1C: Hgb A1c MFr Bld: 6.4 % (ref 4.6–6.5)

## 2019-05-09 LAB — LIPID PANEL
Cholesterol: 171 mg/dL (ref 0–200)
HDL: 57 mg/dL (ref >39.00)
LDL Cholesterol: 81 mg/dL (ref 0–99)
NonHDL: 114.1
Total CHOL/HDL Ratio: 3
Triglycerides: 166 mg/dL — ABNORMAL HIGH (ref 0.0–149.0)
VLDL: 33.2 mg/dL (ref 0.0–40.0)

## 2019-05-09 LAB — MICROALBUMIN / CREATININE URINE RATIO
Creatinine,U: 103 mg/dL
Microalb Creat Ratio: 0.7 mg/g (ref 0.0–30.0)
Microalb, Ur: <0.7 mg/dL (ref 0.0–1.9)

## 2019-05-11 ENCOUNTER — Encounter: Payer: Self-pay | Admitting: Family Medicine

## 2019-05-11 ENCOUNTER — Ambulatory Visit (INDEPENDENT_AMBULATORY_CARE_PROVIDER_SITE_OTHER): Payer: PPO | Admitting: Family Medicine

## 2019-05-11 ENCOUNTER — Other Ambulatory Visit: Payer: Self-pay

## 2019-05-11 VITALS — BP 128/72 | HR 72 | Temp 97.9°F | Ht 64.0 in | Wt 147.9 lb

## 2019-05-11 DIAGNOSIS — Z0000 Encounter for general adult medical examination without abnormal findings: Secondary | ICD-10-CM | POA: Diagnosis not present

## 2019-05-11 DIAGNOSIS — Q100 Congenital ptosis: Secondary | ICD-10-CM

## 2019-05-11 DIAGNOSIS — E785 Hyperlipidemia, unspecified: Secondary | ICD-10-CM

## 2019-05-11 DIAGNOSIS — M8580 Other specified disorders of bone density and structure, unspecified site: Secondary | ICD-10-CM

## 2019-05-11 DIAGNOSIS — E559 Vitamin D deficiency, unspecified: Secondary | ICD-10-CM

## 2019-05-11 DIAGNOSIS — K2100 Gastro-esophageal reflux disease with esophagitis, without bleeding: Secondary | ICD-10-CM

## 2019-05-11 DIAGNOSIS — N390 Urinary tract infection, site not specified: Secondary | ICD-10-CM | POA: Diagnosis not present

## 2019-05-11 DIAGNOSIS — I10 Essential (primary) hypertension: Secondary | ICD-10-CM | POA: Diagnosis not present

## 2019-05-11 DIAGNOSIS — I6522 Occlusion and stenosis of left carotid artery: Secondary | ICD-10-CM | POA: Diagnosis not present

## 2019-05-11 DIAGNOSIS — K582 Mixed irritable bowel syndrome: Secondary | ICD-10-CM

## 2019-05-11 DIAGNOSIS — M5432 Sciatica, left side: Secondary | ICD-10-CM | POA: Diagnosis not present

## 2019-05-11 DIAGNOSIS — I771 Stricture of artery: Secondary | ICD-10-CM

## 2019-05-11 DIAGNOSIS — N3946 Mixed incontinence: Secondary | ICD-10-CM

## 2019-05-11 DIAGNOSIS — R7303 Prediabetes: Secondary | ICD-10-CM

## 2019-05-11 NOTE — Assessment & Plan Note (Signed)
Sees urology

## 2019-05-11 NOTE — Assessment & Plan Note (Signed)
Planned rpt DEXA next year.

## 2019-05-11 NOTE — Assessment & Plan Note (Signed)
Chronic, improved. Continue lipitor.  The 10-year ASCVD risk score Mikey Bussing DC Brooke Bonito., et al., 2013) is: 18.7%   Values used to calculate the score:     Age: 73 years     Sex: Female     Is Non-Hispanic African American: No     Diabetic: No     Tobacco smoker: No     Systolic Blood Pressure: 882 mmHg     Is BP treated: Yes     HDL Cholesterol: 57 mg/dL     Total Cholesterol: 171 mg/dL

## 2019-05-11 NOTE — Assessment & Plan Note (Signed)
Preventative protocols reviewed and updated unless pt declined. Discussed healthy diet and lifestyle.  

## 2019-05-11 NOTE — Patient Instructions (Addendum)
You are doing well today Return as needed or in 1 year for next physical Try sciatica exercises provided today.   Health Maintenance After Age 75 After age 78, you are at a higher risk for certain long-term diseases and infections as well as injuries from falls. Falls are a major cause of broken bones and head injuries in people who are older than age 13. Getting regular preventive care can help to keep you healthy and well. Preventive care includes getting regular testing and making lifestyle changes as recommended by your health care provider. Talk with your health care provider about:  Which screenings and tests you should have. A screening is a test that checks for a disease when you have no symptoms.  A diet and exercise plan that is right for you. What should I know about screenings and tests to prevent falls? Screening and testing are the best ways to find a health problem early. Early diagnosis and treatment give you the best chance of managing medical conditions that are common after age 75. Certain conditions and lifestyle choices may make you more likely to have a fall. Your health care provider may recommend:  Regular vision checks. Poor vision and conditions such as cataracts can make you more likely to have a fall. If you wear glasses, make sure to get your prescription updated if your vision changes.  Medicine review. Work with your health care provider to regularly review all of the medicines you are taking, including over-the-counter medicines. Ask your health care provider about any side effects that may make you more likely to have a fall. Tell your health care provider if any medicines that you take make you feel dizzy or sleepy.  Osteoporosis screening. Osteoporosis is a condition that causes the bones to get weaker. This can make the bones weak and cause them to break more easily.  Blood pressure screening. Blood pressure changes and medicines to control blood pressure can  make you feel dizzy.  Strength and balance checks. Your health care provider may recommend certain tests to check your strength and balance while standing, walking, or changing positions.  Foot health exam. Foot pain and numbness, as well as not wearing proper footwear, can make you more likely to have a fall.  Depression screening. You may be more likely to have a fall if you have a fear of falling, feel emotionally low, or feel unable to do activities that you used to do.  Alcohol use screening. Using too much alcohol can affect your balance and may make you more likely to have a fall. What actions can I take to lower my risk of falls? General instructions  Talk with your health care provider about your risks for falling. Tell your health care provider if: ? You fall. Be sure to tell your health care provider about all falls, even ones that seem minor. ? You feel dizzy, sleepy, or off-balance.  Take over-the-counter and prescription medicines only as told by your health care provider. These include any supplements.  Eat a healthy diet and maintain a healthy weight. A healthy diet includes low-fat dairy products, low-fat (lean) meats, and fiber from whole grains, beans, and lots of fruits and vegetables. Home safety  Remove any tripping hazards, such as rugs, cords, and clutter.  Install safety equipment such as grab bars in bathrooms and safety rails on stairs.  Keep rooms and walkways well-lit. Activity   Follow a regular exercise program to stay fit. This will help you maintain  your balance. Ask your health care provider what types of exercise are appropriate for you.  If you need a cane or walker, use it as recommended by your health care provider.  Wear supportive shoes that have nonskid soles. Lifestyle  Do not drink alcohol if your health care provider tells you not to drink.  If you drink alcohol, limit how much you have: ? 0-1 drink a day for women. ? 0-2 drinks a  day for men.  Be aware of how much alcohol is in your drink. In the [place] one drink equals one typical bottle of beer (12 oz), one-half glass of wine (5 oz), or one shot of hard liquor (1 oz).  Do not use any products that contain nicotine or tobacco, such as cigarettes and e-cigarettes. If you need help quitting, ask your health care provider. Summary  Having a healthy lifestyle and getting preventive care can help to protect your health and wellness after age 73.  Screening and testing are the best way to find a health problem early and help you avoid having a fall. Early diagnosis and treatment give you the best chance for managing medical conditions that are more common for people who are older than age 9.  Falls are a major cause of broken bones and head injuries in people who are older than age 43. Take precautions to prevent a fall at home.  Work with your health care provider to learn what changes you can make to improve your health and wellness and to prevent falls. This information is not intended to replace advice given to you by your health care provider. Make sure you discuss any questions you have with your health care provider. Document Released: 08/18/2017 Document Revised: 01/26/2019 Document Reviewed: 08/18/2017 Elsevier Patient Education  2020 [employer].

## 2019-05-11 NOTE — Assessment & Plan Note (Signed)
Continue daily PPI.  
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
## 2019-05-11 NOTE — Assessment & Plan Note (Signed)
Chronic, stable. Continue current regimen. 

## 2019-05-11 NOTE — Assessment & Plan Note (Signed)
Story/exam consistent with this. Provided with exercises for piriformis syndrome. Continue tylenol, gabapentin PRN. Update if not improving with treatment.

## 2019-05-11 NOTE — Assessment & Plan Note (Signed)
Has seen Dr Amalia Hailey, doing well on daily keflex 250mg  preventatively.

## 2019-05-11 NOTE — Assessment & Plan Note (Deleted)
Advanced directive discussion - Has not set up. Would want husband to be HCPOA. Packet providedlast visit.

## 2019-05-11 NOTE — Assessment & Plan Note (Signed)
Minimal. ?R subclavian stenosis

## 2019-05-11 NOTE — Assessment & Plan Note (Signed)
Reviewed A1c with patient. Encouraged avoiding added sugars.

## 2019-05-11 NOTE — Assessment & Plan Note (Deleted)
Continue vit D daily.  

## 2019-05-17 DIAGNOSIS — H524 Presbyopia: Secondary | ICD-10-CM | POA: Diagnosis not present

## 2019-05-17 DIAGNOSIS — H35371 Puckering of macula, right eye: Secondary | ICD-10-CM | POA: Diagnosis not present

## 2019-05-17 DIAGNOSIS — H40053 Ocular hypertension, bilateral: Secondary | ICD-10-CM | POA: Diagnosis not present

## 2019-05-30 ENCOUNTER — Other Ambulatory Visit: Payer: Self-pay | Admitting: Family Medicine

## 2019-06-03 ENCOUNTER — Emergency Department
Admission: EM | Admit: 2019-06-03 | Discharge: 2019-06-03 | Disposition: A | Discharge status: Home / Self Care | Payer: PPO | Attending: Emergency Medicine | Admitting: Emergency Medicine

## 2019-06-03 ENCOUNTER — Other Ambulatory Visit: Payer: Self-pay

## 2019-06-03 ENCOUNTER — Emergency Department: Payer: PPO

## 2019-06-03 DIAGNOSIS — Z79899 Other long term (current) drug therapy: Secondary | ICD-10-CM | POA: Diagnosis not present

## 2019-06-03 DIAGNOSIS — I2510 Atherosclerotic heart disease of native coronary artery without angina pectoris: Secondary | ICD-10-CM | POA: Diagnosis not present

## 2019-06-03 DIAGNOSIS — K219 Gastro-esophageal reflux disease without esophagitis: Secondary | ICD-10-CM | POA: Insufficient documentation

## 2019-06-03 DIAGNOSIS — R079 Chest pain, unspecified: Secondary | ICD-10-CM | POA: Diagnosis not present

## 2019-06-03 DIAGNOSIS — Z7982 Long term (current) use of aspirin: Secondary | ICD-10-CM | POA: Insufficient documentation

## 2019-06-03 DIAGNOSIS — I10 Essential (primary) hypertension: Secondary | ICD-10-CM | POA: Diagnosis not present

## 2019-06-03 LAB — CBC
HCT: 40 % (ref 36.0–46.0)
Hemoglobin: 12.8 g/dL (ref 12.0–15.0)
MCH: 26.6 pg (ref 26.0–34.0)
MCHC: 32 g/dL (ref 30.0–36.0)
MCV: 83.2 fL (ref 80.0–100.0)
Platelets: 163 10*3/uL (ref 150–400)
RBC: 4.81 MIL/uL (ref 3.87–5.11)
RDW: 13.6 % (ref 11.5–15.5)
WBC: 8.7 10*3/uL (ref 4.0–10.5)
nRBC: 0 % (ref 0.0–0.2)

## 2019-06-03 LAB — BASIC METABOLIC PANEL
Anion gap: 9 (ref 5–15)
BUN: 16 mg/dL (ref 8–23)
CO2: 25 mmol/L (ref 22–32)
Calcium: 9.4 mg/dL (ref 8.9–10.3)
Chloride: 105 mmol/L (ref 98–111)
Creatinine, Ser: 0.79 mg/dL (ref 0.44–1.00)
GFR calc Af Amer: >60 mL/min (ref >60)
GFR calc non Af Amer: >60 mL/min (ref >60)
Glucose, Bld: 96 mg/dL (ref 70–99)
Potassium: 4.1 mmol/L (ref 3.5–5.1)
Sodium: 139 mmol/L (ref 135–145)

## 2019-06-03 LAB — TROPONIN I (HIGH SENSITIVITY)
Troponin I (High Sensitivity): 5 ng/L (ref <18)
Troponin I (High Sensitivity): 5 ng/L (ref <18)

## 2019-06-03 MED ORDER — LIDOCAINE VISCOUS HCL 2 % MT SOLN
15.0000 mL | Freq: Once | OROMUCOSAL | Status: AC
  Administered 2019-06-03: 15 mL via OROMUCOSAL
  Filled 2019-06-03: qty 15

## 2019-06-03 NOTE — ED Notes (Signed)
Pt states central chest pain that she now rates at a 3 after medication administered.
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
## 2019-06-03 NOTE — ED Notes (Signed)
Pt verbalized understanding of discharge instructions. NAD at this time. 

## 2019-06-03 NOTE — ED Triage Notes (Signed)
Pt states L sided CP that is sharp that began around 10am today. States "a while ago it felt like it was going across here" and is pointing to middle chest. Also c/o "face feels weird". A&O, ambulatory.

## 2019-06-03 NOTE — Discharge Instructions (Addendum)
Please seek medical attention for any high fevers, chest pain, shortness of breath, change in behavior, persistent vomiting, bloody stool or any other new or concerning symptoms.  

## 2019-06-12 ENCOUNTER — Other Ambulatory Visit: Payer: Self-pay | Admitting: Family Medicine

## 2019-06-13 ENCOUNTER — Other Ambulatory Visit: Payer: Self-pay | Admitting: Family Medicine

## 2019-06-13 DIAGNOSIS — R102 Pelvic and perineal pain: Secondary | ICD-10-CM | POA: Diagnosis not present

## 2019-06-13 DIAGNOSIS — R3989 Other symptoms and signs involving the genitourinary system: Secondary | ICD-10-CM | POA: Diagnosis not present

## 2019-06-13 DIAGNOSIS — N952 Postmenopausal atrophic vaginitis: Secondary | ICD-10-CM | POA: Diagnosis not present

## 2019-06-13 DIAGNOSIS — Z87440 Personal history of urinary (tract) infections: Secondary | ICD-10-CM | POA: Diagnosis not present

## 2019-06-13 NOTE — Telephone Encounter (Signed)
Name of Medication: Alprazolam Name of Pharmacy: [facility] or Written Date and Quantity: 04/13/19 #40 Last Office Visit and Type: 05/11/19 Next Office Visit and Type: 05/16/2020 CPE Last Controlled Substance Agreement Date: 04/16/17 Last UDS:04/16/17

## 2019-06-13 NOTE — Telephone Encounter (Signed)
Eprescribed.

## 2019-06-14 NOTE — Telephone Encounter (Signed)
Gabapentin Last rx:  07/29/18, #60 Last OV:  05/11/19, CPE Prt 2 Next OV:  05/16/20, CPE Prt 2

## 2019-06-15 ENCOUNTER — Other Ambulatory Visit: Payer: Self-pay | Admitting: Family Medicine

## 2019-06-16 MED ORDER — GABAPENTIN 100 MG PO CAPS: 100.0000 mg | ORAL_CAPSULE | Freq: Three times a day (TID) | ORAL | 0 refills | Status: DC

## 2019-06-16 NOTE — Telephone Encounter (Signed)
LOV 05/11/2019 for CPE. Future appointment on 05/16/2020. Last filled on 07/29/2018 for #60 with 0 refill. Per last office note states to use PRN per Dr Darnell Level. Please review

## 2019-06-18 ENCOUNTER — Other Ambulatory Visit: Payer: Self-pay | Admitting: Family Medicine

## 2019-06-21 DIAGNOSIS — H40053 Ocular hypertension, bilateral: Secondary | ICD-10-CM | POA: Diagnosis not present

## 2019-06-27 ENCOUNTER — Other Ambulatory Visit: Payer: Self-pay | Admitting: Family Medicine

## 2019-07-03 DIAGNOSIS — B009 Herpesviral infection, unspecified: Secondary | ICD-10-CM | POA: Diagnosis not present

## 2019-07-03 DIAGNOSIS — Z85828 Personal history of other malignant neoplasm of skin: Secondary | ICD-10-CM | POA: Diagnosis not present

## 2019-07-03 DIAGNOSIS — L814 Other melanin hyperpigmentation: Secondary | ICD-10-CM | POA: Diagnosis not present

## 2019-07-03 DIAGNOSIS — L820 Inflamed seborrheic keratosis: Secondary | ICD-10-CM | POA: Diagnosis not present

## 2019-07-03 DIAGNOSIS — L821 Other seborrheic keratosis: Secondary | ICD-10-CM | POA: Diagnosis not present

## 2019-07-03 DIAGNOSIS — L570 Actinic keratosis: Secondary | ICD-10-CM | POA: Diagnosis not present

## 2019-07-03 DIAGNOSIS — L218 Other seborrheic dermatitis: Secondary | ICD-10-CM | POA: Diagnosis not present

## 2019-07-12 ENCOUNTER — Encounter: Payer: Self-pay | Admitting: Family Medicine

## 2019-07-12 ENCOUNTER — Other Ambulatory Visit: Payer: Self-pay

## 2019-07-12 ENCOUNTER — Ambulatory Visit (INDEPENDENT_AMBULATORY_CARE_PROVIDER_SITE_OTHER): Payer: PPO | Admitting: Family Medicine

## 2019-07-12 VITALS — BP 132/70 | HR 71 | Temp 97.8°F | Ht 64.0 in | Wt 146.1 lb

## 2019-07-12 DIAGNOSIS — Z23 Encounter for immunization: Secondary | ICD-10-CM | POA: Diagnosis not present

## 2019-07-12 DIAGNOSIS — R21 Rash and other nonspecific skin eruption: Secondary | ICD-10-CM

## 2019-07-12 MED ORDER — MUPIROCIN 2 % EX OINT: 1.0000 "application " | TOPICAL_OINTMENT | Freq: Two times a day (BID) | CUTANEOUS | 0 refills | Status: DC

## 2019-07-12 NOTE — Progress Notes (Signed)
This visit was conducted in person.  BP 132/70 (BP Location: Left Arm, Patient Position: Sitting, Cuff Size: Normal)   Pulse 71   Temp 97.8 F (36.6 C) (Temporal)   Ht 5\' 4"  (1.626 m)   Wt 146 lb 2 oz (66.3 kg)   LMP 10/20/1979 (Within Years)   SpO2 97%   BMI 25.08 kg/m    CC: check L foot insect bite. Subjective:    Patient ID: Bianca Fry, female    DOB: 1945/12/25, 73 y.o.   MRN: [id number]  HPI: Bianca Fry is a 73 y.o. female presenting on 07/12/2019 for Insect Bite (C/o insect bite on anterior left foot.  Noticed about 3 wks ago.  Seemed to have developed a rash around the area about 1 wk ago.  Says area is tingly.  Tried hydrocorisone cream, Kenalog cream and Neosporin. )   3 wks ago had insect bite to L anterior foot. Unsure what but possible spider, did not feel bite. Developed into hard blister. Developed rash 2 wks after bite. Tender bumpy rash around bite. Last night paresthesia sensation that kept her up. Has tried cortisone 10, neosporin, kenalog.      Relevant past medical, surgical, family and social history reviewed and updated as indicated. Interim medical history since our last visit reviewed. Allergies and medications reviewed and updated. Outpatient Medications Prior to Visit  Medication Sig Dispense Refill  . ALPRAZolam (XANAX) 0.5 MG tablet TAKE 1 TABLET BY MOUTH AT BEDTIME AS NEEDED FOR ANXIETY 40 tablet 0  . aspirin EC 81 MG EC tablet Take 1 tablet (81 mg total) by mouth daily. 30 tablet 2  . atenolol (TENORMIN) 25 MG tablet Take 1 tablet by mouth once daily 90 tablet 3  . atorvastatin (LIPITOR) 20 MG tablet TAKE 1 TABLET BY MOUTH TWICE A WEEK (TAKE  ON  MONDAY  AND  FRIDAY) 24 tablet 2  . Calcium Carb-Cholecalciferol (CALCIUM-VITAMIN D) 600-400 MG-UNIT TABS Take 1 tablet by mouth daily.    . cephALEXin (KEFLEX) 250 MG capsule Take 250 mg by mouth daily.  99  . Cholecalciferol (VITAMIN D3 PO) Take by mouth.    . dicyclomine (BENTYL) 10 MG capsule TAKE  1 CAPSULE BY MOUTH TWICE DAILY AS NEEDED FOR  SPASMS 90 capsule 1  . estradiol (ESTRACE) 0.1 MG/GM vaginal cream Use 1/2 g vaginally three times a week. 42.5 g 1  . fexofenadine (ALLEGRA) 180 MG tablet Take 180 mg by mouth daily as needed for allergies.     Marland Kitchen gabapentin (NEURONTIN) 100 MG capsule Take 1 capsule (100 mg total) by mouth 3 (three) times daily. 60 capsule 0  . omeprazole (PRILOSEC) 40 MG capsule Take 1 capsule by mouth once daily 90 capsule 3  . ondansetron (ZOFRAN) 8 MG tablet TAKE 1 TABLET BY MOUTH EVERY 8 HOURS AS NEEDED FOR NAUSEA AND VOMITING 90 tablet 0  . triamcinolone cream (KENALOG) 0.1 % Apply 1 application topically 2 (two) times daily. Apply to AA. Limit to 2 weeks duration    . valACYclovir (VALTREX) 1000 MG tablet TAKE 2 TABLETS BY MOUTH IN THE MORNING AND 2 IN THE EVENING FOR 1 DAY ONLY AS NEEDEED FOR FLARE     No facility-administered medications prior to visit.      Per HPI unless specifically indicated in ROS section below Review of Systems Objective:    BP 132/70 (BP Location: Left Arm, Patient Position: Sitting, Cuff Size: Normal)   Pulse 71   Temp 97.8 F (  36.6 C) (Temporal)   Ht 5\' 4"  (1.626 m)   Wt 146 lb 2 oz (66.3 kg)   LMP 10/20/1979 (Within Years)   SpO2 97%   BMI 25.08 kg/m   Wt Readings from Last 3 Encounters:  07/12/19 146 lb 2 oz (66.3 kg)  06/03/19 147 lb (66.7 kg)  05/11/19 147 lb 14.4 oz (67.1 kg)    Physical Exam Vitals signs and nursing note reviewed.  Constitutional:      General: She is not in acute distress.    Appearance: Normal appearance. She is not ill-appearing.  Musculoskeletal: Normal range of motion.     Right lower leg: No edema.     Left lower leg: No edema.     Comments: 2+ rad pulses bilaterally  Skin:    General: Skin is warm and dry.     Capillary Refill: Capillary refill takes less than 2 seconds.     Findings: Erythema and rash present.     Comments: Firm dry larger vesicle/papule distal dorsal L foot with  surrounding tender papular rash around initial papule, mild swelling around rash. No crusting or drainage noted.   Neurological:     Mental Status: She is alert.     Comments: Sensation intact       Assessment & Plan:   Problem List Items Addressed This Visit    Skin rash - Primary    Anticipate new rash around initial spider bite suffered 2-3 wks ago. Possibly infected. Treat with continued triamcinolone cream, add mupirocin BID for 1 wk. Update if not improving with this.        Other Visit Diagnoses    Need for influenza vaccination       Relevant Orders   Flu Vaccine QUAD High Dose(Fluad) (Completed)       Meds ordered this encounter  Medications  . mupirocin ointment (BACTROBAN) 2 %    Sig: Apply 1 application topically 2 (two) times daily.    Dispense:  22 g    Refill:  0    Formulate cream vs ointment based on pt cost/insurance preference   Orders Placed This Encounter  Procedures  . Flu Vaccine QUAD High Dose(Fluad)    Follow up plan: No follow-ups on file.  Ria Bush, MD

## 2019-07-12 NOTE — Patient Instructions (Addendum)
Flu shot today I do think this started as spider bite, now with rash around bite possible irritated dermatitis vs infection.  Treat with continue triamcinolone cream twice daily as well as start antibiotic cream (mupirocin) sent to pharmacy twice daily for 1 week.  Update me if not improving with this.

## 2019-07-12 NOTE — Assessment & Plan Note (Signed)
Anticipate new rash around initial spider bite suffered 2-3 wks ago. Possibly infected. Treat with continued triamcinolone cream, add mupirocin BID for 1 wk. Update if not improving with this.

## 2019-07-26 ENCOUNTER — Other Ambulatory Visit: Payer: Self-pay | Admitting: Obstetrics and Gynecology

## 2019-07-26 DIAGNOSIS — Z1231 Encounter for screening mammogram for malignant neoplasm of breast: Secondary | ICD-10-CM

## 2019-08-25 ENCOUNTER — Other Ambulatory Visit: Payer: Self-pay | Admitting: Family Medicine

## 2019-08-25 NOTE — Telephone Encounter (Signed)
ERx 

## 2019-08-25 NOTE — Telephone Encounter (Signed)
Name of Medication: Alprazolam Name of Pharmacy: [facility] Last Fill or Written Date and Quantity: 06/13/19, #40 Last Office Visit and Type: 07/12/19, acute rash Next Office Visit and Type: 05/16/20, CPE prt 2 Last Controlled Substance Agreement Date: 04/16/17 Last UDS: 04/16/17

## 2019-09-08 ENCOUNTER — Ambulatory Visit
Admission: RE | Admit: 2019-09-08 | Discharge: 2019-09-08 | Disposition: A | Discharge status: Home / Self Care | Payer: PPO | Source: Ambulatory Visit | Attending: Obstetrics and Gynecology | Admitting: Obstetrics and Gynecology

## 2019-09-08 ENCOUNTER — Other Ambulatory Visit: Payer: Self-pay

## 2019-09-08 DIAGNOSIS — Z1231 Encounter for screening mammogram for malignant neoplasm of breast: Secondary | ICD-10-CM

## 2019-09-13 ENCOUNTER — Other Ambulatory Visit: Payer: Self-pay

## 2019-09-20 DIAGNOSIS — H02051 Trichiasis without entropian right upper eyelid: Secondary | ICD-10-CM | POA: Diagnosis not present

## 2019-10-05 ENCOUNTER — Ambulatory Visit
Admission: RE | Admit: 2019-10-05 | Discharge: 2019-10-05 | Disposition: A | Discharge status: Home / Self Care | Payer: PPO | Source: Ambulatory Visit | Attending: Obstetrics and Gynecology | Admitting: Obstetrics and Gynecology

## 2019-10-05 ENCOUNTER — Other Ambulatory Visit: Payer: Self-pay

## 2019-10-05 DIAGNOSIS — M8580 Other specified disorders of bone density and structure, unspecified site: Secondary | ICD-10-CM

## 2019-10-05 DIAGNOSIS — Z780 Asymptomatic menopausal state: Secondary | ICD-10-CM

## 2019-10-05 DIAGNOSIS — M8589 Other specified disorders of bone density and structure, multiple sites: Secondary | ICD-10-CM | POA: Diagnosis not present

## 2019-10-16 ENCOUNTER — Other Ambulatory Visit: Payer: Self-pay | Admitting: Obstetrics and Gynecology

## 2019-10-16 DIAGNOSIS — N952 Postmenopausal atrophic vaginitis: Secondary | ICD-10-CM

## 2019-10-16 DIAGNOSIS — Z01419 Encounter for gynecological examination (general) (routine) without abnormal findings: Secondary | ICD-10-CM

## 2019-10-16 NOTE — Telephone Encounter (Signed)
Med refill request: Estrace Last AEX: 04/11/19 Next AEX: 04/17/2020 Last MMG (if hormonal med) 09/12/19, BIRADS 1, Negative Refill authorized: 43g, 0RF. Orders pended if approved.

## 2019-10-17 ENCOUNTER — Other Ambulatory Visit: Payer: Self-pay | Admitting: *Deleted

## 2019-10-17 DIAGNOSIS — M8580 Other specified disorders of bone density and structure, unspecified site: Secondary | ICD-10-CM

## 2019-10-26 ENCOUNTER — Other Ambulatory Visit: Payer: Self-pay | Admitting: Family Medicine

## 2019-10-26 NOTE — Telephone Encounter (Signed)
ERx 

## 2019-11-01 DIAGNOSIS — H02051 Trichiasis without entropian right upper eyelid: Secondary | ICD-10-CM | POA: Diagnosis not present

## 2019-11-08 ENCOUNTER — Other Ambulatory Visit: Payer: Self-pay

## 2019-11-08 ENCOUNTER — Ambulatory Visit (INDEPENDENT_AMBULATORY_CARE_PROVIDER_SITE_OTHER): Payer: PPO | Admitting: Family Medicine

## 2019-11-08 ENCOUNTER — Encounter: Payer: Self-pay | Admitting: Family Medicine

## 2019-11-08 VITALS — BP 157/72 | HR 69

## 2019-11-08 DIAGNOSIS — G44209 Tension-type headache, unspecified, not intractable: Secondary | ICD-10-CM

## 2019-11-08 DIAGNOSIS — I10 Essential (primary) hypertension: Secondary | ICD-10-CM

## 2019-11-08 NOTE — Progress Notes (Signed)
Virtual Visit via Telephone Note  I connected with Bianca Fry on 11/08/19 at  9:20 AM EST by telephone and verified that I am speaking with the correct person using two identifiers.   I discussed the limitations, risks, security and privacy concerns of performing an evaluation and management service by telephone and the availability of in person appointments. I also discussed with the patient that there may be a patient responsible charge related to this service. The patient expressed understanding and agreed to proceed.  Patient location: Home Provider Location: [place] Participants: Lesleigh Noe and Bianca Fry   History of Present Illness: Chief Complaint  Patient presents with  . Headache    off and on. pain radiates to the back of the right ear and across her face.    Headache  This is a new problem. The current episode started yesterday. The problem occurs intermittently. The pain is located in the left unilateral (from the ear to the nose) region. The pain radiates to the face (left ear). Quality: pressure like pain on the face. The pain is mild. Associated symptoms include ear pain and neck pain (chronic). Pertinent negatives include no abdominal pain, back pain, blurred vision, coughing, eye watering, fever, muscle aches, nausea, numbness, phonophobia, photophobia, sinus pressure, sore throat, tinnitus or vomiting. She has tried acetaminophen (gabapentin) for the symptoms. The treatment provided mild relief. Her past medical history is significant for hypertension and migraine headaches.   December - had 2 occular migraines w/in 2 weeks of each other  Will occasional get headaches which feel like they travel from the back of the neck  #HTN - Previously has been normal - generally not elevated - a little nervous with phone visit today   Review of Systems  Constitutional: Negative for fever.  HENT: Positive for ear pain. Negative for sinus pressure, sore  throat and tinnitus.   Eyes: Negative for blurred vision and photophobia.  Respiratory: Negative for cough.   Gastrointestinal: Negative for abdominal pain, nausea and vomiting.  Musculoskeletal: Positive for neck pain (chronic). Negative for back pain.  Neurological: Positive for headaches. Negative for numbness.      Observations/Objective: BP (!) 157/72 Comment: per patient  Pulse 69 Comment: per patient  LMP 10/20/1979 (Within Years)   Phone visit:  Patient speaking in complete sentences No distress Alert and oriented Normal mood  Assessment and Plan: Problem List Items Addressed This Visit      Cardiovascular and Mediastinum   Essential hypertension - Primary    Other Visit Diagnoses    Acute non intractable tension-type headache         Pt now with mild symptoms. Hx of facial neuralgia and endorses this is similar location but does not describe nerve like pain. Also having neck pain so suspect a tension component to HA. Tylenol/NSAID, heat, hydration.   Given that occasional Covid presents with new HA recommended getting tested to rule this out.   ER precautions given Advised staying out of work and isolating until results have come back Information for contacting [facility] for scheduled testing provided   Follow Up Instructions:  Return if symptoms worsen or fail to improve.   I discussed the assessment and treatment plan with the patient. The patient was provided an opportunity to ask questions and all were answered. The patient agreed with the plan and demonstrated an understanding of the instructions.   The patient was advised to call back or seek an in-person evaluation if  the symptoms worsen or if the condition fails to improve as anticipated.  I provided 15 minutes of non-face-to-face time during this encounter.   Lesleigh Noe, MD

## 2019-11-08 NOTE — Patient Instructions (Addendum)
Covid Testing Location options:   [place] options:  Text "COVID" to [phone] - or -  [email or web address] to schedule at one of the [place] locations  [place] to find locations near you: [phone]   Headache Treatment - Use tylenol or ibuprofen (I would avoid Aspirin) - Try using heat to your neck and do some gentle neck stretching - drink lots of water - get tested for Covid to rule this out - while waiting for test results - you should isolate and any close contacts should quarantine    Positive Coronovirus - When isolation ends 1) 10 days have passed from when symptoms started  2) Other symptoms have improved - no longer with cough or shortness of breath 3) At least 24 hours since your last fever without needing any tylenol or ibuprofen  Call the clinic immediately or consider going to the emergency room if:  1) Trouble Breathing 2) Persistent pain or pressure in the chest 3) New confusion or inability to wake 4) Bluish lips or face 5) Notify them that you may have COVID-19

## 2019-11-09 ENCOUNTER — Ambulatory Visit: Payer: PPO | Attending: Internal Medicine

## 2019-11-09 DIAGNOSIS — Z20822 Contact with and (suspected) exposure to covid-19: Secondary | ICD-10-CM

## 2019-11-10 LAB — NOVEL CORONAVIRUS, NAA: SARS-CoV-2, NAA: NOT DETECTED

## 2019-11-20 ENCOUNTER — Ambulatory Visit (INDEPENDENT_AMBULATORY_CARE_PROVIDER_SITE_OTHER): Payer: PPO | Admitting: Family Medicine

## 2019-11-20 ENCOUNTER — Encounter: Payer: Self-pay | Admitting: Family Medicine

## 2019-11-20 ENCOUNTER — Ambulatory Visit: Payer: PPO | Admitting: Internal Medicine

## 2019-11-20 ENCOUNTER — Other Ambulatory Visit: Payer: Self-pay

## 2019-11-20 VITALS — BP 146/78 | HR 71 | Temp 97.1°F | Ht 64.0 in | Wt 148.0 lb

## 2019-11-20 DIAGNOSIS — I771 Stricture of artery: Secondary | ICD-10-CM

## 2019-11-20 DIAGNOSIS — I6522 Occlusion and stenosis of left carotid artery: Secondary | ICD-10-CM | POA: Diagnosis not present

## 2019-11-20 DIAGNOSIS — I10 Essential (primary) hypertension: Secondary | ICD-10-CM

## 2019-11-20 DIAGNOSIS — R109 Unspecified abdominal pain: Secondary | ICD-10-CM | POA: Diagnosis not present

## 2019-11-20 LAB — COMPREHENSIVE METABOLIC PANEL
ALT: 11 U/L (ref 0–35)
AST: 15 U/L (ref 0–37)
Albumin: 4.1 g/dL (ref 3.5–5.2)
Alkaline Phosphatase: 102 U/L (ref 39–117)
BUN: 15 mg/dL (ref 6–23)
CO2: 28 mEq/L (ref 19–32)
Calcium: 9.6 mg/dL (ref 8.4–10.5)
Chloride: 102 mEq/L (ref 96–112)
Creatinine, Ser: 0.83 mg/dL (ref 0.40–1.20)
GFR: 67.03 mL/min (ref >60.00)
Glucose, Bld: 104 mg/dL — ABNORMAL HIGH (ref 70–99)
Potassium: 4.2 mEq/L (ref 3.5–5.1)
Sodium: 138 mEq/L (ref 135–145)
Total Bilirubin: 0.3 mg/dL (ref 0.2–1.2)
Total Protein: 7.3 g/dL (ref 6.0–8.3)

## 2019-11-20 LAB — CBC WITH DIFFERENTIAL/PLATELET
Basophils Absolute: 0 10*3/uL (ref 0.0–0.1)
Basophils Relative: 0.2 % (ref 0.0–3.0)
Eosinophils Absolute: 0.1 10*3/uL (ref 0.0–0.7)
Eosinophils Relative: 1.2 % (ref 0.0–5.0)
HCT: 37.8 % (ref 36.0–46.0)
Hemoglobin: 12.4 g/dL (ref 12.0–15.0)
Lymphocytes Relative: 27.6 % (ref 12.0–46.0)
Lymphs Abs: 2.2 10*3/uL (ref 0.7–4.0)
MCHC: 32.9 g/dL (ref 30.0–36.0)
MCV: 83.4 fl (ref 78.0–100.0)
Monocytes Absolute: 0.6 10*3/uL (ref 0.1–1.0)
Monocytes Relative: 7.6 % (ref 3.0–12.0)
Neutro Abs: 5 10*3/uL (ref 1.4–7.7)
Neutrophils Relative %: 63.4 % (ref 43.0–77.0)
Platelets: 171 10*3/uL (ref 150.0–400.0)
RBC: 4.54 Mil/uL (ref 3.87–5.11)
RDW: 14.7 % (ref 11.5–15.5)
WBC: 7.9 10*3/uL (ref 4.0–10.5)

## 2019-11-20 LAB — POC URINALSYSI DIPSTICK (AUTOMATED)
Bilirubin, UA: NEGATIVE
Glucose, UA: NEGATIVE
Ketones, UA: NEGATIVE
Leukocytes, UA: NEGATIVE
Nitrite, UA: NEGATIVE
Protein, UA: NEGATIVE
Spec Grav, UA: 1.02 (ref 1.010–1.025)
Urobilinogen, UA: 0.2 E.U./dL
pH, UA: 6 (ref 5.0–8.0)

## 2019-11-20 LAB — LIPASE: Lipase: 21 U/L (ref 11.0–59.0)

## 2019-11-20 NOTE — Progress Notes (Signed)
This visit was conducted in person.  BP (!) 146/78 (BP Location: Left Arm, Patient Position: Sitting, Cuff Size: Small)   Pulse 71   Temp (!) 97.1 F (36.2 C) (Temporal)   Ht 5\' 4"  (1.626 m)   Wt 148 lb (67.1 kg)   LMP 10/20/1979 (Within Years)   SpO2 91%   BMI 25.40 kg/m   BP Readings from Last 3 Encounters:  11/20/19 (!) 146/78  11/08/19 (!) 157/72  07/12/19 132/70    CC: L side abd pain Subjective:    Patient ID: Bianca Fry, female    DOB: [birth year], 74 y.o.   MRN: [id number]  HPI: Bianca Fry is a 74 y.o. female presenting on 11/20/2019 for Flank Pain (Left side, off & on for approx. 1 year )   1 yr h/o intermittent L flank and abd side pain described as sore ache. Not diet related. Possibly prolonged standing may incite pain - ie cooking for thanksgiving. Latest episode was last week, lasted longer than normal - several hours. Last BM this morning - normal. Passing gas well.   No fevers/chills, nausea/vomiting, diarrhea, constipation blood in stool or other bowel changes. No significant urinary symptoms with pain.   Sees Dr Amalia Hailey for chronic UTI - last seen 05/2019 - on daily ppx keflex.  H/o ischemic colitis - has seen Dr Fuller Plan.  CT abd/pelvis w/ contrast 2019 - prominent sigmoid colonic diverticulosis  Home BP readings have been running elevated - 150/60s (she has been in pain).      Relevant past medical, surgical, family and social history reviewed and updated as indicated. Interim medical history since our last visit reviewed. Allergies and medications reviewed and updated. Outpatient Medications Prior to Visit  Medication Sig Dispense Refill  . ALPRAZolam (XANAX) 0.5 MG tablet TAKE 1 TABLET BY MOUTH AT BEDTIME AS NEEDED FOR ANXIETY 40 tablet 0  . aspirin EC 81 MG EC tablet Take 1 tablet (81 mg total) by mouth daily. 30 tablet 2  . atenolol (TENORMIN) 25 MG tablet Take 1 tablet by mouth once daily 90 tablet 3  . atorvastatin (LIPITOR) 20 MG tablet TAKE 1  TABLET BY MOUTH TWICE A WEEK (TAKE  ON  MONDAY  AND  FRIDAY) 24 tablet 2  . Calcium Carb-Cholecalciferol (CALCIUM-VITAMIN D) 600-400 MG-UNIT TABS Take 1 tablet by mouth daily.    . cephALEXin (KEFLEX) 250 MG capsule Take 250 mg by mouth daily.  99  . Cholecalciferol (VITAMIN D3 PO) Take by mouth.    . dicyclomine (BENTYL) 10 MG capsule TAKE 1 CAPSULE BY MOUTH TWICE DAILY AS NEEDED FOR  SPASMS 90 capsule 1  . estradiol (ESTRACE) 0.1 MG/GM vaginal cream USE ONE-HALF GRAM VAGINALLY THREE TIMES A WEEK 43 g 0  . fexofenadine (ALLEGRA) 180 MG tablet Take 180 mg by mouth daily as needed for allergies.     Marland Kitchen gabapentin (NEURONTIN) 100 MG capsule Take 1 capsule (100 mg total) by mouth 3 (three) times daily. 60 capsule 0  . mupirocin ointment (BACTROBAN) 2 % Apply 1 application topically 2 (two) times daily. (Patient taking differently: Apply 1 application topically 2 (two) times daily as needed. ) 22 g 0  . omeprazole (PRILOSEC) 40 MG capsule Take 1 capsule by mouth once daily 90 capsule 3  . ondansetron (ZOFRAN) 8 MG tablet TAKE 1 TABLET BY MOUTH EVERY 8 HOURS AS NEEDED FOR NAUSEA AND VOMITING 90 tablet 0  . triamcinolone cream (KENALOG) 0.1 % prn    . valACYclovir (  VALTREX) 1000 MG tablet TAKE 2 TABLETS BY MOUTH IN THE MORNING AND 2 IN THE EVENING FOR 1 DAY ONLY AS NEEDEED FOR FLARE     No facility-administered medications prior to visit.     Per HPI unless specifically indicated in ROS section below Review of Systems Objective:    BP (!) 146/78 (BP Location: Left Arm, Patient Position: Sitting, Cuff Size: Small)   Pulse 71   Temp (!) 97.1 F (36.2 C) (Temporal)   Ht 5\' 4"  (1.626 m)   Wt 148 lb (67.1 kg)   LMP 10/20/1979 (Within Years)   SpO2 91%   BMI 25.40 kg/m   Wt Readings from Last 3 Encounters:  11/20/19 148 lb (67.1 kg)  07/12/19 146 lb 2 oz (66.3 kg)  06/03/19 147 lb (66.7 kg)    Physical Exam Vitals and nursing note reviewed.  Constitutional:      Appearance: Normal  appearance. She is not ill-appearing.  Neck:     Vascular: Carotid bruit (L mild) present.  Cardiovascular:     Rate and Rhythm: Normal rate and regular rhythm.     Pulses: Normal pulses.     Heart sounds: Normal heart sounds. No murmur.  Pulmonary:     Effort: Pulmonary effort is normal. No respiratory distress.     Breath sounds: Normal breath sounds. No wheezing, rhonchi or rales.  Abdominal:     General: Abdomen is flat. Bowel sounds are normal. There is no distension.     Palpations: Abdomen is soft. There is no mass.     Tenderness: There is abdominal tenderness (mild-mod) in the suprapubic area and left lower quadrant. There is no right CVA tenderness, left CVA tenderness, guarding or rebound.     Hernia: No hernia is present.  Musculoskeletal:     Right lower leg: No edema.     Left lower leg: No edema.  Skin:    Findings: No rash.  Neurological:     Mental Status: She is alert.  Psychiatric:        Mood and Affect: Mood normal.        Behavior: Behavior normal.       Results for orders placed or performed in visit on 11/20/19  POCT Urinalysis Dipstick (Automated)  Result Value Ref Range   Color, UA Yellow    Clarity, UA Clear    Glucose, UA Negative Negative   Bilirubin, UA Neg    Ketones, UA Neg    Spec Grav, UA 1.020 1.010 - 1.025   Blood, UA Trace    pH, UA 6.0 5.0 - 8.0   Protein, UA Negative Negative   Urobilinogen, UA 0.2 0.2 or 1.0 E.U./dL   Nitrite, UA Neg    Leukocytes, UA Negative Negative   Assessment & Plan:  This visit occurred during the SARS-CoV-2 public health emergency.  Safety protocols were in place, including screening questions prior to the visit, additional usage of staff PPE, and extensive cleaning of exam room while observing appropriate contact time as indicated for disinfecting solutions.   Problem List Items Addressed This Visit    Stenosis of right subclavian artery ([place])    Update imaging.       Left lateral abdominal pain -  Primary    Progressive L sided abdominal pain without acute abdomen on exam. Known sigmoid diverticulosis, recurrent UTIs and h/o ischemic colitis. Check UA and lab work, consider further evaluation with abdominal imaging pending results ([place] vs CT).  Relevant Orders   Comprehensive metabolic panel   CBC with Differential/Platelet   Lipase   POCT Urinalysis Dipstick (Automated) (Completed)   Left carotid artery stenosis    Update imaging.       Relevant Orders   VAS US CAROTID   Essential hypertension    BP recently elevated - in setting of abd pain - advised monitor at home when in resting state, and let us know if staying elevated - would likely increase atenolol to BID dosing.           No orders of the defined types were placed in this encounter.  Orders Placed This Encounter  Procedures  . Comprehensive metabolic panel  . CBC with Differential/Platelet  . Lipase  . POCT Urinalysis Dipstick (Automated)    Patient Instructions  Labs today, urinalysis today.  Pending results we will discuss abdominal imaging.  We will order carotid ultrasound to update.    Follow up plan: Return if symptoms worsen or fail to improve.  Ria Bush, MD

## 2019-11-20 NOTE — Assessment & Plan Note (Signed)
Progressive L sided abdominal pain without acute abdomen on exam. Known sigmoid diverticulosis, recurrent UTIs and h/o ischemic colitis. Check UA and lab work, consider further evaluation with abdominal imaging pending results ([place] vs CT).

## 2019-11-20 NOTE — Assessment & Plan Note (Signed)
BP recently elevated - in setting of abd pain - advised monitor at home when in resting state, and let us know if staying elevated - would likely increase atenolol to BID dosing.

## 2019-11-20 NOTE — Patient Instructions (Addendum)
Labs today, urinalysis today.  Pending results we will discuss abdominal imaging.  We will order carotid ultrasound to update.

## 2019-11-20 NOTE — Assessment & Plan Note (Signed)
Update imaging.  

## 2019-11-24 ENCOUNTER — Other Ambulatory Visit: Payer: Self-pay

## 2019-11-24 ENCOUNTER — Ambulatory Visit (INDEPENDENT_AMBULATORY_CARE_PROVIDER_SITE_OTHER): Payer: PPO

## 2019-11-24 DIAGNOSIS — I6522 Occlusion and stenosis of left carotid artery: Secondary | ICD-10-CM

## 2019-11-26 ENCOUNTER — Other Ambulatory Visit: Payer: Self-pay | Admitting: Family Medicine

## 2019-11-26 DIAGNOSIS — R109 Unspecified abdominal pain: Secondary | ICD-10-CM

## 2019-11-27 DIAGNOSIS — H02051 Trichiasis without entropian right upper eyelid: Secondary | ICD-10-CM | POA: Diagnosis not present

## 2019-12-04 ENCOUNTER — Other Ambulatory Visit: Payer: Self-pay

## 2019-12-04 ENCOUNTER — Ambulatory Visit
Admission: RE | Admit: 2019-12-04 | Discharge: 2019-12-04 | Disposition: A | Discharge status: Home / Self Care | Payer: PPO | Source: Ambulatory Visit | Attending: Family Medicine | Admitting: Family Medicine

## 2019-12-04 DIAGNOSIS — R109 Unspecified abdominal pain: Secondary | ICD-10-CM | POA: Diagnosis not present

## 2019-12-04 DIAGNOSIS — D1803 Hemangioma of intra-abdominal structures: Secondary | ICD-10-CM | POA: Diagnosis not present

## 2019-12-17 ENCOUNTER — Encounter: Payer: Self-pay | Admitting: Family Medicine

## 2019-12-17 DIAGNOSIS — K5790 Diverticulosis of intestine, part unspecified, without perforation or abscess without bleeding: Secondary | ICD-10-CM | POA: Insufficient documentation

## 2019-12-28 ENCOUNTER — Other Ambulatory Visit: Payer: Self-pay | Admitting: Family Medicine

## 2019-12-28 NOTE — Telephone Encounter (Signed)
ERx 

## 2020-01-04 ENCOUNTER — Telehealth: Payer: Self-pay

## 2020-01-04 NOTE — Telephone Encounter (Signed)
Pt f/u with office concerning abdominal pain and msg below copied from imaging result.  Pt is taking dicyclomine when having abd pain. Pt is also currently using activia every morning. Advised to continue activia and dicyclomine and also take 500mg  tylenol daily and f/u with clinic in 2 wks. Pt verbalized understanding.    Ria Bush, MD  12/17/2019 12:30 PM EST    Actually I'd like to try something prior to return to GI. Is she taking dicyclomine? If not would recommend she try this for when she has abd pain. Also would recommend she start tylenol 500mg  once daily for 2 weeks as well as probiotic like activia or align or [facility] colon health daily for 2 weeks then update [place] with effect.     Pt reports abdominal pain has only occurred about one to two times recently and she reports the pain is not bad. Pt is wondering if this could be coming from her back. Pt also reported the last few morning she has woken very anxious. She is taking xanax at night and taking 1/2 tab in the morning. She is not sure why she is having anxiety but she reports she has been anxious about getting the covid vaccine but she has had it and did not have any problems. Pt is concerned about her BP readings. She has not been consistent with readings and reports she has taken them at different times in the morning. Today it was 170/77 and the readings over the last week when she has taken it are 145/69, 150/65, 159/64, 151/86, 130/79. She is not sure if she is taking atenolol before or after the readings. She thinks both. She reports PCP said her dose may need to be increased. Pt denies any chest pain or SOB.  Advised pt to take readings twice daily and record and f/u with office in one week with readings. Advised if any new symptoms, SOB or chest pain to contact the office immediately. Advised a msg would be sent to PCP and this office would f/u.  Pt verbalized understanding.

## 2020-01-08 NOTE — Telephone Encounter (Signed)
Pt c/o ongoing anxiety. Pt reports last night was really bad sitting watching tv. Pt used xanax but she has anxiety again this morning. Advised med will not last that long. Pt reports she knows that but she is having more anxiety and would like apt with Dr. Darnell Level. Advised no apt until tomorrow. Pt only want to see Dr. Darnell Level. Made apt for tomorrow.  Pt also reports she noticed that her atenolol was not put in her pill box the past week. She noticed on Friday and started taking it again. Pt reports she has never done this in the past. Advised pt to bring in BP cuff and BP readings. Advised if anything is needed before apt tomorrow to contact office. Pt verbalized understanding.

## 2020-01-08 NOTE — Telephone Encounter (Signed)
Noted! Thank you

## 2020-01-09 ENCOUNTER — Ambulatory Visit (INDEPENDENT_AMBULATORY_CARE_PROVIDER_SITE_OTHER): Payer: PPO | Admitting: Family Medicine

## 2020-01-09 ENCOUNTER — Ambulatory Visit (INDEPENDENT_AMBULATORY_CARE_PROVIDER_SITE_OTHER)
Admission: RE | Admit: 2020-01-09 | Discharge: 2020-01-09 | Disposition: A | Discharge status: Home / Self Care | Payer: PPO | Source: Ambulatory Visit | Attending: Family Medicine | Admitting: Family Medicine

## 2020-01-09 ENCOUNTER — Other Ambulatory Visit: Payer: Self-pay

## 2020-01-09 ENCOUNTER — Encounter: Payer: Self-pay | Admitting: Family Medicine

## 2020-01-09 VITALS — BP 140/70 | HR 65 | Temp 97.9°F | Ht 64.0 in | Wt 149.6 lb

## 2020-01-09 DIAGNOSIS — M546 Pain in thoracic spine: Secondary | ICD-10-CM

## 2020-01-09 DIAGNOSIS — R002 Palpitations: Secondary | ICD-10-CM | POA: Diagnosis not present

## 2020-01-09 DIAGNOSIS — R109 Unspecified abdominal pain: Secondary | ICD-10-CM

## 2020-01-09 DIAGNOSIS — I10 Essential (primary) hypertension: Secondary | ICD-10-CM

## 2020-01-09 DIAGNOSIS — G8929 Other chronic pain: Secondary | ICD-10-CM

## 2020-01-09 DIAGNOSIS — F419 Anxiety disorder, unspecified: Secondary | ICD-10-CM | POA: Diagnosis not present

## 2020-01-09 NOTE — Assessment & Plan Note (Signed)
BP recently elevated but better controlled since restarting atenolol. I did ask her to start monitoring blood pressures at home in evenings and if staying elevated, consider change in beta blocker to metoprolol for better full day coverage.

## 2020-01-09 NOTE — Progress Notes (Signed)
This visit was conducted in person.  BP 140/70 (BP Location: Left Arm, Patient Position: Sitting, Cuff Size: Normal)   Pulse 65   Temp 97.9 F (36.6 C) (Temporal)   Ht 5\' 4"  (1.626 m)   Wt 149 lb 9 oz (67.8 kg)   LMP 10/20/1979 (Within Years)   SpO2 97%   BMI 25.67 kg/m    CC: anxiety Subjective:    Patient ID: Bianca Fry, female    DOB: 08/31/1946, 74 y.o.   MRN: [id number]  HPI: Bianca Fry is a 74 y.o. female presenting on 01/09/2020 for Anxiety (Here for f/u.  Had panic attack on 01/07/20.  Took Xanax and ASA, helpful.  Also, brought home BP monitor to compare, reading 130/78. )   See recent phone note for details.  Notes increasing anxiety over the past 2 weeks, did also accidentally stop atenolol. She has restarted atenolol but symptoms persist some. Tends to happen in the morning - describes anxious feeling in chest. Sunday night had bad panic attack - strange sensation in chest with fluttering heart. No dyspnea with this. She took xanax and aspirin - after 30 min started feeling better.   Doesn't feel depressed, not a Biochemist, clinical. On xanax since age 42 after hysterectomy. Takes nightly.   BP recently elevated 180/100s - but she realized she had accidentally stopped atenolol for 1 week. Restarted atenolol Friday, Saturday morning BP better controlled. Pulse 60-80s.   Denies new stressors over the past 2 weeks.    Some ongoing L side discomfort, only had 1 episode since last seen 11/20/2019.      Relevant past medical, surgical, family and social history reviewed and updated as indicated. Interim medical history since our last visit reviewed. Allergies and medications reviewed and updated. Outpatient Medications Prior to Visit  Medication Sig Dispense Refill  . ALPRAZolam (XANAX) 0.5 MG tablet TAKE 1 TABLET BY MOUTH AT BEDTIME AS NEEDED FOR ANXIETY 40 tablet 0  . aspirin EC 81 MG EC tablet Take 1 tablet (81 mg total) by mouth daily. 30 tablet 2  . atenolol (TENORMIN)  25 MG tablet Take 1 tablet by mouth once daily 90 tablet 3  . atorvastatin (LIPITOR) 20 MG tablet TAKE 1 TABLET BY MOUTH TWICE A WEEK (TAKE  ON  MONDAY  AND  FRIDAY) 24 tablet 2  . Calcium Carb-Cholecalciferol (CALCIUM-VITAMIN D) 600-400 MG-UNIT TABS Take 1 tablet by mouth daily.    . cephALEXin (KEFLEX) 250 MG capsule Take 250 mg by mouth daily.  99  . Cholecalciferol (VITAMIN D3 PO) Take by mouth.    . dicyclomine (BENTYL) 10 MG capsule TAKE 1 CAPSULE BY MOUTH TWICE DAILY AS NEEDED FOR  SPASMS 90 capsule 1  . estradiol (ESTRACE) 0.1 MG/GM vaginal cream USE ONE-HALF GRAM VAGINALLY THREE TIMES A WEEK 43 g 0  . fexofenadine (ALLEGRA) 180 MG tablet Take 180 mg by mouth daily as needed for allergies.     Marland Kitchen gabapentin (NEURONTIN) 100 MG capsule Take 1 capsule (100 mg total) by mouth 3 (three) times daily. 60 capsule 0  . mupirocin ointment (BACTROBAN) 2 % Apply 1 application topically 2 (two) times daily. (Patient taking differently: Apply 1 application topically 2 (two) times daily as needed. ) 22 g 0  . omeprazole (PRILOSEC) 40 MG capsule Take 1 capsule by mouth once daily 90 capsule 3  . ondansetron (ZOFRAN) 8 MG tablet TAKE 1 TABLET BY MOUTH EVERY 8 HOURS AS NEEDED FOR NAUSEA AND VOMITING 90 tablet  0  . triamcinolone cream (KENALOG) 0.1 % prn    . valACYclovir (VALTREX) 1000 MG tablet TAKE 2 TABLETS BY MOUTH IN THE MORNING AND 2 IN THE EVENING FOR 1 DAY ONLY AS NEEDEED FOR FLARE     No facility-administered medications prior to visit.     Per HPI unless specifically indicated in ROS section below Review of Systems Objective:    BP 140/70 (BP Location: Left Arm, Patient Position: Sitting, Cuff Size: Normal)   Pulse 65   Temp 97.9 F (36.6 C) (Temporal)   Ht 5\' 4"  (1.626 m)   Wt 149 lb 9 oz (67.8 kg)   LMP 10/20/1979 (Within Years)   SpO2 97%   BMI 25.67 kg/m   Wt Readings from Last 3 Encounters:  01/09/20 149 lb 9 oz (67.8 kg)  11/20/19 148 lb (67.1 kg)  07/12/19 146 lb 2 oz (66.3  kg)    Physical Exam Vitals and nursing note reviewed.  Constitutional:      Appearance: Normal appearance. She is not ill-appearing.  Eyes:     Extraocular Movements: Extraocular movements intact.     Pupils: Pupils are equal, round, and reactive to light.  Neck:     Thyroid: No thyromegaly or thyroid tenderness.  Cardiovascular:     Rate and Rhythm: Normal rate and regular rhythm.     Pulses: Normal pulses.     Heart sounds: Normal heart sounds. No murmur.  Pulmonary:     Effort: Pulmonary effort is normal. No respiratory distress.     Breath sounds: Normal breath sounds. No wheezing, rhonchi or rales.  Musculoskeletal:     Right lower leg: No edema.     Left lower leg: No edema.     Comments: No thoracic back pain or paraspinous mm tenderness along thoracic spine  Skin:    Findings: No rash.  Neurological:     Mental Status: She is alert.  Psychiatric:        Mood and Affect: Mood normal.        Behavior: Behavior normal.       Results for orders placed or performed in visit on 11/20/19  Comprehensive metabolic panel  Result Value Ref Range   Sodium 138 135 - 145 mEq/L   Potassium 4.2 3.5 - 5.1 mEq/L   Chloride 102 96 - 112 mEq/L   CO2 28 19 - 32 mEq/L   Glucose, Bld 104 (H) 70 - 99 mg/dL   BUN 15 6 - 23 mg/dL   Creatinine, Ser 0.83 0.40 - 1.20 mg/dL   Total Bilirubin 0.3 0.2 - 1.2 mg/dL   Alkaline Phosphatase 102 39 - 117 U/L   AST 15 0 - 37 U/L   ALT 11 0 - 35 U/L   Total Protein 7.3 6.0 - 8.3 g/dL   Albumin 4.1 3.5 - 5.2 g/dL   GFR 67.03 >60.00 mL/min   Calcium 9.6 8.4 - 10.5 mg/dL  CBC with Differential/Platelet  Result Value Ref Range   WBC 7.9 4.0 - 10.5 K/uL   RBC 4.54 3.87 - 5.11 Mil/uL   Hemoglobin 12.4 12.0 - 15.0 g/dL   HCT 37.8 36.0 - 46.0 %   MCV 83.4 78.0 - 100.0 fl   MCHC 32.9 30.0 - 36.0 g/dL   RDW 14.7 11.5 - 15.5 %   Platelets 171.0 150.0 - 400.0 K/uL   Neutrophils Relative % 63.4 43.0 - 77.0 %   Lymphocytes Relative 27.6 12.0 - 46.0 %    Monocytes Relative  7.6 3.0 - 12.0 %   Eosinophils Relative 1.2 0.0 - 5.0 %   Basophils Relative 0.2 0.0 - 3.0 %   Neutro Abs 5.0 1.4 - 7.7 K/uL   Lymphs Abs 2.2 0.7 - 4.0 K/uL   Monocytes Absolute 0.6 0.1 - 1.0 K/uL   Eosinophils Absolute 0.1 0.0 - 0.7 K/uL   Basophils Absolute 0.0 0.0 - 0.1 K/uL  Lipase  Result Value Ref Range   Lipase 21.0 11.0 - 59.0 U/L  POCT Urinalysis Dipstick (Automated)  Result Value Ref Range   Color, UA Yellow    Clarity, UA Clear    Glucose, UA Negative Negative   Bilirubin, UA Neg    Ketones, UA Neg    Spec Grav, UA 1.020 1.010 - 1.025   Blood, UA Trace    pH, UA 6.0 5.0 - 8.0   Protein, UA Negative Negative   Urobilinogen, UA 0.2 0.2 or 1.0 E.U./dL   Nitrite, UA Neg    Leukocytes, UA Negative Negative   Lab Results  Component Value Date   TSH 1.816 04/04/2016    Depression screen [facility] 2/9 01/09/2020 05/05/2019 05/05/2018 04/20/2018 04/16/2017  Decreased Interest 0 0 0 0 0  Down, Depressed, Hopeless 0 0 0 0 3  PHQ - 2 Score 0 0 0 0 3  Altered sleeping 1 0 - 0 0  Tired, decreased energy 0 0 - 0 0  Change in appetite 0 0 - 0 0  Feeling bad or failure about yourself  0 0 - 0 0  Trouble concentrating 0 0 - 0 0  Moving slowly or fidgety/restless 0 0 - 0 0  Suicidal thoughts 0 0 - 0 0  PHQ-9 Score 1 0 - 0 3  Difficult doing work/chores - - - Not difficult at all -  Some recent data might be hidden    GAD 7 : Generalized Anxiety Score 01/09/2020  Nervous, Anxious, on Edge 2  Control/stop worrying 1  Worry too much - different things 0  Trouble relaxing 0  Restless 0  Easily annoyed or irritable 0  Afraid - awful might happen 0  Total GAD 7 Score 3   EKG - sinus bradycardia 50s, normal axis, intervals, no acute ST/T changes.  Assessment & Plan:  This visit occurred during the SARS-CoV-2 public health emergency.  Safety protocols were in place, including screening questions prior to the visit, additional usage of staff PPE, and extensive  cleaning of exam room while observing appropriate contact time as indicated for disinfecting solutions.   Problem List Items Addressed This Visit    Left lateral abdominal pain    S/p reassuring GI eval in the past. Check thoracic films - ?referred pain from thoracic spine.       Essential hypertension    BP recently elevated but better controlled since restarting atenolol. I did ask her to start monitoring blood pressures at home in evenings and if staying elevated, consider change in beta blocker to metoprolol for better full day coverage.       Chronic left-sided thoracic back pain - Primary    Longstanding L sided abdominal discomfort s/p prior unrevealing GI evaluation. Seems to be positional in nature suggesting MSK cause like thoracic radicular pain - will check thoracic films today.       Relevant Orders   DG Thoracic Spine W/Swimmers (Completed)   Anxiety    Recent episodes of anxiety in setting of accidentally stopping beta blocker - ?rebound tachycardia leading to anxiety  sensation. Now back on atenolol. Check EKG today.        Other Visit Diagnoses    Palpitations       Relevant Orders   EKG 12-Lead (Completed)       No orders of the defined types were placed in this encounter.  Orders Placed This Encounter  Procedures  . DG Thoracic Spine W/Swimmers    Standing Status:   Future    Number of Occurrences:   1    Standing Expiration Date:   03/10/2021    Order Specific Question:   Reason for Exam (SYMPTOM  OR DIAGNOSIS REQUIRED)    Answer:   L abd side pain and thoracic back discomfort    Order Specific Question:   Preferred imaging location?    Answer:   [facility]    Order Specific Question:   Radiology Contrast Protocol - do NOT remove file path    Answer:   [email or web address]  . EKG 12-Lead    Patient Instructions  Blood pressures are looking ok.  Work on Personal assistant at home.  Continue monitoring blood  pressure, especially at night and if persistently elevated at night, we may change blood pressure medicine.  Increase water intake during the day.  EKG today.  Thoracic xrays today.    Follow up plan: No follow-ups on file.  Ria Bush, MD

## 2020-01-09 NOTE — Patient Instructions (Addendum)
Blood pressures are looking ok.  Work on Personal assistant at home.  Continue monitoring blood pressure, especially at night and if persistently elevated at night, we may change blood pressure medicine.  Increase water intake during the day.  EKG today.  Thoracic xrays today.

## 2020-01-09 NOTE — Assessment & Plan Note (Signed)
Recent episodes of anxiety in setting of accidentally stopping beta blocker - ?rebound tachycardia leading to anxiety sensation. Now back on atenolol. Check EKG today.

## 2020-01-10 NOTE — Assessment & Plan Note (Addendum)
Longstanding L sided abdominal discomfort s/p prior unrevealing GI evaluation. Seems to be positional in nature suggesting MSK cause like thoracic radicular pain - will check thoracic films today.

## 2020-01-10 NOTE — Assessment & Plan Note (Signed)
S/p reassuring GI eval in the past. Check thoracic films - ?referred pain from thoracic spine.

## 2020-01-11 ENCOUNTER — Ambulatory Visit: Payer: PPO | Admitting: Internal Medicine

## 2020-01-11 ENCOUNTER — Encounter: Payer: Self-pay | Admitting: Internal Medicine

## 2020-01-11 ENCOUNTER — Other Ambulatory Visit: Payer: Self-pay

## 2020-01-11 VITALS — BP 130/70 | HR 70 | Ht 64.0 in | Wt 148.0 lb

## 2020-01-11 DIAGNOSIS — M810 Age-related osteoporosis without current pathological fracture: Secondary | ICD-10-CM

## 2020-01-11 LAB — BASIC METABOLIC PANEL
BUN: 16 mg/dL (ref 6–23)
CO2: 29 mEq/L (ref 19–32)
Calcium: 9.4 mg/dL (ref 8.4–10.5)
Chloride: 104 mEq/L (ref 96–112)
Creatinine, Ser: 0.89 mg/dL (ref 0.40–1.20)
GFR: 61.82 mL/min (ref >60.00)
Glucose, Bld: 90 mg/dL (ref 70–99)
Potassium: 4.4 mEq/L (ref 3.5–5.1)
Sodium: 138 mEq/L (ref 135–145)

## 2020-01-11 LAB — VITAMIN D 25 HYDROXY (VIT D DEFICIENCY, FRACTURES): VITD: 34.82 ng/mL (ref 30.00–100.00)

## 2020-01-11 MED ORDER — ALENDRONATE SODIUM 70 MG PO TABS: 70.0000 mg | ORAL_TABLET | ORAL | 3 refills | Status: DC
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
## 2020-01-11 NOTE — Patient Instructions (Addendum)
Please stop at the lab.  Please make sure you get ~1200 mg calcium a day, preferentially from the diet. If you need a supplement, make sure you get calcium citrate. For now, decrease calcium to just once a day.  Please start Fosamax 70 mg weekly.  Please come back for a follow-up appointment in 1 year.  How Can I Prevent Falls? Men and women with osteoporosis need to take care not to fall down. Falls can break bones. Some reasons people fall are: Poor vision  Poor balance  Certain diseases that affect how you walk  Some types of medicine, such as sleeping pills.  Some tips to help prevent falls outdoors are: Use a cane or walker  Wear rubber-soled shoes so you don't slip  Walk on grass when sidewalks are slippery  In winter, put salt or kitty litter on icy sidewalks.  Some ways to help prevent falls indoors are: Keep rooms free of clutter, especially on floors  Use plastic or carpet runners on slippery floors  Wear low-heeled shoes that provide good support  Do not walk in socks, stockings, or slippers  Be sure carpets and area rugs have skid-proof backs or are tacked to the floor  Be sure stairs are well lit and have rails on both sides  Put grab bars on bathroom walls near tub, shower, and toilet  Use a rubber bath mat in the shower or tub  Keep a flashlight next to your bed  Use a sturdy step stool with a handrail and wide steps  Add more lights in rooms (and night lights) Buy a cordless phone to keep with you so that you don't have to rush to the phone       when it rings and so that you can call for help if you fall.   (adapted from http://www.[employer]se)  Alendronate tablets What is this medicine? ALENDRONATE (a LEN droe nate) slows calcium loss from bones. It helps to make normal healthy bone and to slow bone loss in people with Paget's disease and osteoporosis. It may be used in others at risk for bone loss. This  medicine may be used for other purposes; ask your health care provider or pharmacist if you have questions. COMMON BRAND NAME(S): Fosamax What should I tell my health care provider before I take this medicine? They need to know if you have any of these conditions:  dental disease  esophagus, stomach, or intestine problems, like acid reflux or GERD  kidney disease  low blood calcium  low vitamin D  problems sitting or standing 30 minutes  trouble swallowing  an unusual or allergic reaction to alendronate, other medicines, foods, dyes, or preservatives  pregnant or trying to get pregnant  breast-feeding How should I use this medicine? You must take this medicine exactly as directed or you will lower the amount of the medicine you absorb into your body or you may cause yourself harm. Take this medicine by mouth first thing in the morning, after you are up for the day. Do not eat or drink anything before you take your medicine. Swallow the tablet with a full glass (6 to 8 fluid ounces) of plain water. Do not take this medicine with any other drink. Do not chew or crush the tablet. After taking this medicine, do not eat breakfast, drink, or take any medicines or vitamins for at least 30 minutes. Sit or stand up for at least 30 minutes after you take this medicine; do not lie down.  Do not take your medicine more often than directed. Talk to your pediatrician regarding the use of this medicine in children. Special care may be needed. Overdosage: If you think you have taken too much of this medicine contact a poison control center or emergency room at once. NOTE: This medicine is only for you. Do not share this medicine with others. What if I miss a dose? If you miss a dose, do not take it later in the day. Continue your normal schedule starting the next morning. Do not take double or extra doses. What may interact with this medicine?  aluminum hydroxide  antacids  aspirin  calcium  supplements  drugs for inflammation like ibuprofen, naproxen, and others  iron supplements  magnesium supplements  vitamins with minerals This list may not describe all possible interactions. Give your health care provider a list of all the medicines, herbs, non-prescription drugs, or dietary supplements you use. Also tell them if you smoke, drink alcohol, or use illegal drugs. Some items may interact with your medicine. What should I watch for while using this medicine? Visit your doctor or health care professional for regular checks ups. It may be some time before you see benefit from this medicine. Do not stop taking your medicine except on your doctor's advice. Your doctor or health care professional may order blood tests and other tests to see how you are doing. You should make sure you get enough calcium and vitamin D while you are taking this medicine, unless your doctor tells you not to. Discuss the foods you eat and the vitamins you take with your health care professional. Some people who take this medicine have severe bone, joint, and/or muscle pain. This medicine may also increase your risk for a broken thigh bone. Tell your doctor right away if you have pain in your upper leg or groin. Tell your doctor if you have any pain that does not go away or that gets worse. This medicine can make you more sensitive to the sun. If you get a rash while taking this medicine, sunlight may cause the rash to get worse. Keep out of the sun. If you cannot avoid being in the sun, wear protective clothing and use sunscreen. Do not use sun lamps or tanning beds/booths. What side effects may I notice from receiving this medicine? Side effects that you should report to your doctor or health care professional as soon as possible:  allergic reactions like skin rash, itching or hives, swelling of the face, lips, or tongue  black or tarry stools  bone, muscle or joint pain  changes in vision  chest  pain  heartburn or stomach pain  jaw pain, especially after dental work  pain or trouble when swallowing  redness, blistering, peeling or loosening of the skin, including inside the mouth Side effects that usually do not require medical attention (report to your doctor or health care professional if they continue or are bothersome):  changes in taste  diarrhea or constipation  eye pain or itching  headache  nausea or vomiting  stomach gas or fullness This list may not describe all possible side effects. Call your doctor for medical advice about side effects. You may report side effects to FDA at 1-800-FDA-1088. Where should I keep my medicine? Keep out of the reach of children. Store at room temperature of 15 and 30 degrees C (59 and 86 degrees F). Throw away any unused medicine after the expiration date. NOTE: This sheet is a summary.  It may not cover all possible information. If you have questions about this medicine, talk to your doctor, pharmacist, or health care provider.  2020 [employer] Standard (2011-04-03 08:56:09)   Exercise for Strong Bones (from [employer]) There are two types of exercises that are important for building and maintaining bone density:  weight-bearing and muscle-strengthening exercises. Weight-bearing Exercises These exercises include activities that make you move against gravity while staying upright. Weight-bearing exercises can be high-impact or low-impact. High-impact weight-bearing exercises help build bones and keep them strong. If you have broken a bone due to osteoporosis or are at risk of breaking a bone, you may need to avoid high-impact exercises. If you're not sure, you should check with your healthcare provider. Examples of high-impact weight-bearing exercises are: . Dancing . Doing high-impact aerobics . Hiking . Jogging/running . Jumping Rope . Stair climbing . Tennis Low-impact weight-bearing exercises can  also help keep bones strong and are a safe alternative if you cannot do high-impact exercises. Examples of low-impact weight-bearing exercises are: . Using elliptical training machines . Doing low-impact aerobics . Using stair-step machines . Fast walking on a treadmill or outside Muscle-Strengthening Exercises These exercises include activities where you move your body, a weight or some other resistance against gravity. They are also known as resistance exercises and include: . Lifting weights . Using elastic exercise bands . Using weight machines . Lifting your own body weight . Functional movements, such as standing and rising up on your toes Yoga and Pilates can also improve strength, balance and flexibility. However, certain positions may not be safe for people with osteoporosis or those at increased risk of broken bones. For example, exercises that have you bend forward may increase the chance of breaking a bone in the spine. A physical therapist should be able to help you learn which exercises are safe and appropriate for you. Non-Impact Exercises Non-impact exercises can help you to improve balance, posture and how well you move in everyday activities. These exercises can also help to increase muscle strength and decrease the risk of falls and broken bones. Some of these exercises include: . Balance exercises that strengthen your legs and test your balance, such as Tai Chi, can decrease your risk of falls. . Posture exercises that improve your posture and reduce rounded or "sloping" shoulders can help you decrease the chance of breaking a bone, especially in the spine. . Functional exercises that improve how well you move can help you with everyday activities and decrease your chance of falling and breaking a bone. For example, if you have trouble getting up from a chair or climbing stairs, you should do these activities as exercises. A physical therapist can teach you balance, posture and  functional exercises. Starting a New Exercise Program If you haven't exercised regularly for a while, check with your healthcare provider before beginning a new exercise program-particularly if you have health problems such as heart disease, diabetes or high blood pressure. If you're at high risk of breaking a bone, you should work with a physical therapist to develop a safe exercise program. Once you have your healthcare provider's approval, start slowly. If you've already broken bones in the spine because of osteoporosis, be very careful to avoid activities that require reaching down, bending forward, rapid twisting motions, heavy lifting and those that increase your chance of a fall. As you get started, your muscles may feel sore for a day or two after you exercise. If soreness lasts longer, you may be  working too hard and need to ease up. Exercises should be done in a pain-free range of motion. How Much Exercise Do You Need? Weight-bearing exercises 30 minutes on most days of the week. Do a 30-minutesession or multiple sessions spread out throughout the day. The benefits to your bones are the same.   Muscle-strengthening exercises Two to three days per week. If you don't have much time for strengthening/resistance training, do small amounts at a time. You can do just one body part each day. For example do arms one day, legs the next and trunk the next. You can also spread these exercises out during your normal day.  Balance, posture and functional exercises Every day or as often as needed. You may want to focus on one area more than the others. If you have fallen or lose your balance, spend time doing balance exercises. If you are getting rounded shoulders, work more on posture exercises. If you have trouble climbing stairs or getting up from the couch, do more functional exercises. You can also perform these exercises at one time or spread them during your day. Work with a phyiscal therapist to learn  the right exercises for you.

## 2020-01-14 ENCOUNTER — Encounter: Payer: Self-pay | Admitting: Family Medicine

## 2020-01-29 ENCOUNTER — Ambulatory Visit (INDEPENDENT_AMBULATORY_CARE_PROVIDER_SITE_OTHER): Payer: PPO | Admitting: Family Medicine

## 2020-01-29 ENCOUNTER — Other Ambulatory Visit: Payer: Self-pay

## 2020-01-29 ENCOUNTER — Encounter: Payer: Self-pay | Admitting: Family Medicine

## 2020-01-29 VITALS — BP 146/75 | HR 79 | Temp 97.0°F | Ht 64.0 in | Wt 152.1 lb

## 2020-01-29 DIAGNOSIS — R079 Chest pain, unspecified: Secondary | ICD-10-CM | POA: Diagnosis not present

## 2020-01-29 DIAGNOSIS — I10 Essential (primary) hypertension: Secondary | ICD-10-CM | POA: Diagnosis not present

## 2020-01-29 DIAGNOSIS — F411 Generalized anxiety disorder: Secondary | ICD-10-CM | POA: Diagnosis not present

## 2020-01-29 DIAGNOSIS — R002 Palpitations: Secondary | ICD-10-CM

## 2020-01-29 NOTE — Assessment & Plan Note (Signed)
May be worsening her chest pain (which is in turn making her more anxious)- suspect a cycle of symptoms  Per pt has been on multiple medications and xanax is the only thing that she tolerates  Good exercise habits Enc her to avoid caffeine

## 2020-01-29 NOTE — Assessment & Plan Note (Signed)
Intermittent with atypical chest pain  Pt desires cardiology consult  Already taking atenolol -bp is mildly elevated today  Anxiety may add to this as well/discussed  Is at times relieved by prn xanax
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
## 2020-01-29 NOTE — Assessment & Plan Note (Signed)
Atypical in pt with h/o non obstructive vascular dz (well controlled lipids with statin)  Pt is very anxious and wants to r/o cardiac cause of this and palpitations Taking atenolol  Anxiety may play a role  Has had cardiac w/u in ? 2017 Desires another consultation with cardiology  Ref done  Will update pcp

## 2020-01-29 NOTE — Assessment & Plan Note (Signed)
bp in fair control at this time - per pt somewhat labile  May be up in response to anxiety as well BP Readings from Last 1 Encounters:  01/29/20 (!) 146/75  taking atenolol Will update PCP Most recent labs reviewed  Disc lifstyle change with low sodium diet and exercise

## 2020-01-29 NOTE — Patient Instructions (Addendum)
Let's get you set up for a cardiology visit to discuss your chest discomfort and palpitations   The office will call you to set that   Ruling out a heart problem may calm your anxiety   Take care of yourself  Keep walking

## 2020-02-10 DIAGNOSIS — I739 Peripheral vascular disease, unspecified: Secondary | ICD-10-CM | POA: Insufficient documentation
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
## 2020-02-12 ENCOUNTER — Other Ambulatory Visit: Payer: Self-pay

## 2020-02-12 ENCOUNTER — Encounter: Payer: Self-pay | Admitting: Cardiovascular Disease

## 2020-02-12 ENCOUNTER — Ambulatory Visit: Payer: PPO | Admitting: Cardiovascular Disease

## 2020-02-12 VITALS — BP 184/84 | HR 66 | Ht 64.25 in | Wt 151.5 lb

## 2020-02-12 DIAGNOSIS — R079 Chest pain, unspecified: Secondary | ICD-10-CM | POA: Diagnosis not present

## 2020-02-12 DIAGNOSIS — R7303 Prediabetes: Secondary | ICD-10-CM

## 2020-02-12 DIAGNOSIS — I739 Peripheral vascular disease, unspecified: Secondary | ICD-10-CM

## 2020-02-12 DIAGNOSIS — E782 Mixed hyperlipidemia: Secondary | ICD-10-CM

## 2020-02-12 DIAGNOSIS — F411 Generalized anxiety disorder: Secondary | ICD-10-CM

## 2020-02-12 MED ORDER — ATORVASTATIN CALCIUM 10 MG PO TABS: 10.0000 mg | ORAL_TABLET | Freq: Every day | ORAL | 3 refills | Status: DC

## 2020-02-12 MED ORDER — LOSARTAN POTASSIUM 50 MG PO TABS: 50.0000 mg | ORAL_TABLET | Freq: Every day | ORAL | 3 refills | Status: DC

## 2020-02-12 NOTE — Patient Instructions (Addendum)
If you have more fluttering, Please call the office,  We would order a ZIO monitor  Medication Instructions:  Your physician has recommended you make the following change in your medication:   1. START Losartan 50 mg once daily  2. START Atorvastatin 10 mg once daily   If you need a refill on your cardiac medications before your next appointment, please call your pharmacy.   Lab work: No new labs needed   If you have labs (blood work) drawn today and your tests are completely normal, you will receive your results only by: Marland Kitchen MyChart Message (if you have MyChart) OR . A paper copy in the mail If you have any lab test that is abnormal or we need to change your treatment, we will call you to review the results.   Testing/Procedures: No new testing needed   Follow-Up: At [facility], you and your health needs are our priority.  As part of our continuing mission to provide you with exceptional heart care, we have created designated Provider Care Teams.  These Care Teams include your primary Cardiologist (physician) and Advanced Practice Providers (APPs -  Physician Assistants and Nurse Practitioners) who all work together to provide you with the care you need, when you need it.  . You will need a follow up appointment f/u 1 year  . Providers on your designated Care Team:   . Murray Hodgkins, NP . Christell Faith, PA-C . Marrianne Mood, PA-C  Any Other Special Instructions Will Be Listed Below (If Applicable).  For educational health videos Log in to : [email or web address] Or : [email or web address], password : [email or web address]

## 2020-02-13 IMAGING — CT CT HEAD W/O CM
3 series · 15 of 47 positions shown, 18 images · non-contrast
Comparison: MRI brain 05/16/2005

CLINICAL DATA: Sudden onset right-sided headache while in shower
worsening tonight. Nausea and vomiting in triage.

EXAM:
CT HEAD WITHOUT CONTRAST
TECHNIQUE: Contiguous axial images were obtained from the base of the skull
through the vertex without intravenous contrast.

[Series 2: head wo · axial · 0.42mm/px · z∈[+406,+531]mm · 9 of 30 slices shown, 12 images]
[im 3/30  brain]
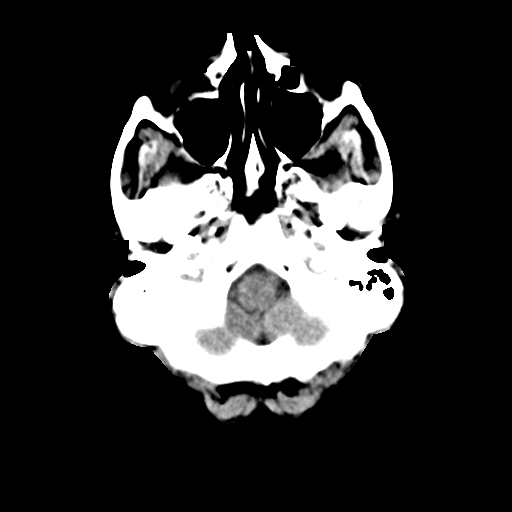
[im 3/30  bone]
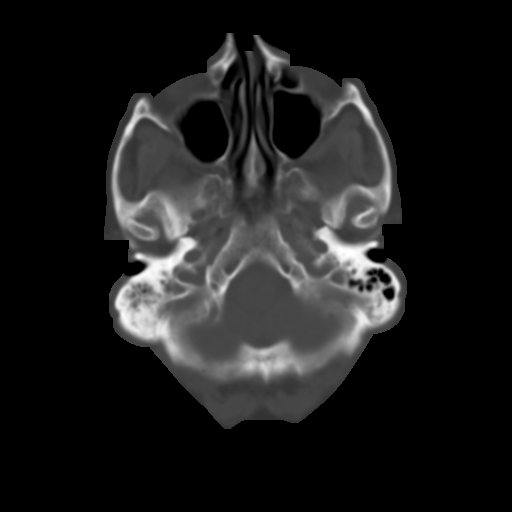
[im 6/30  brain]
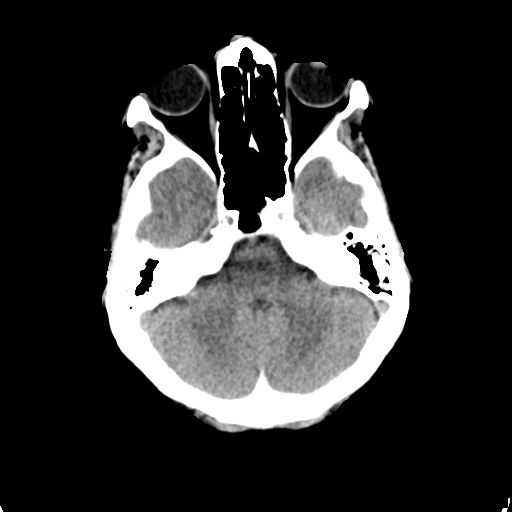
[im 9/30  brain]
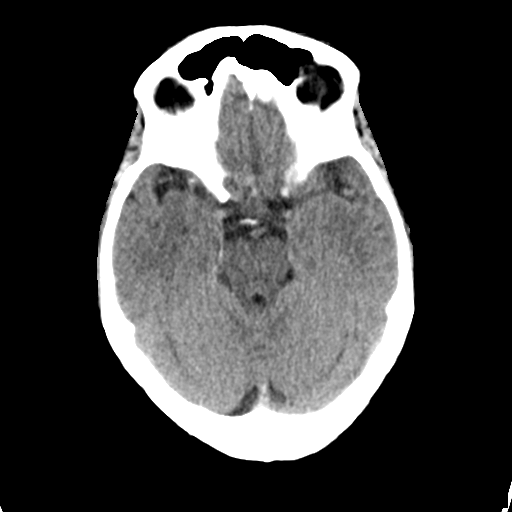
[im 12/30  brain]
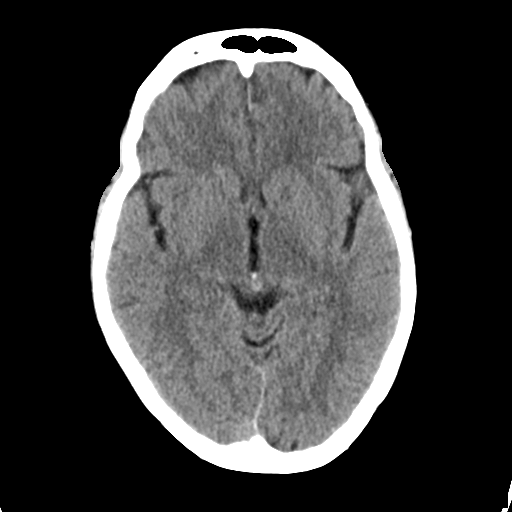
[im 16/30  brain]
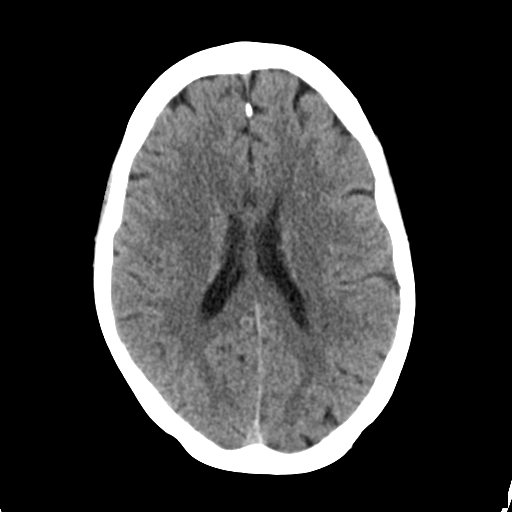
[im 16/30  bone]
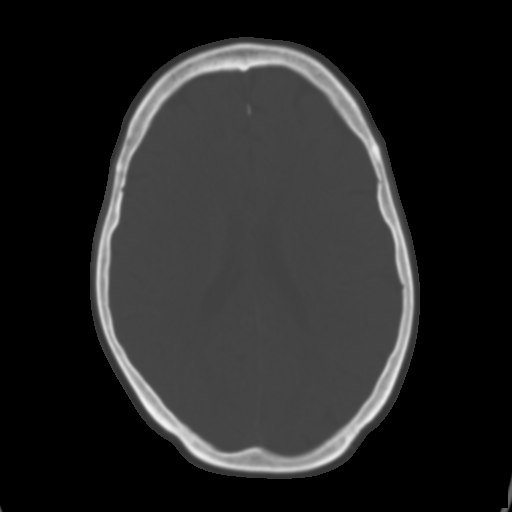
[im 19/30  brain]
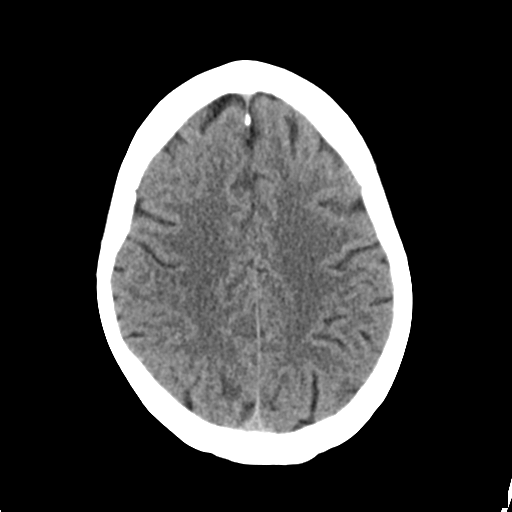
[im 22/30  brain]
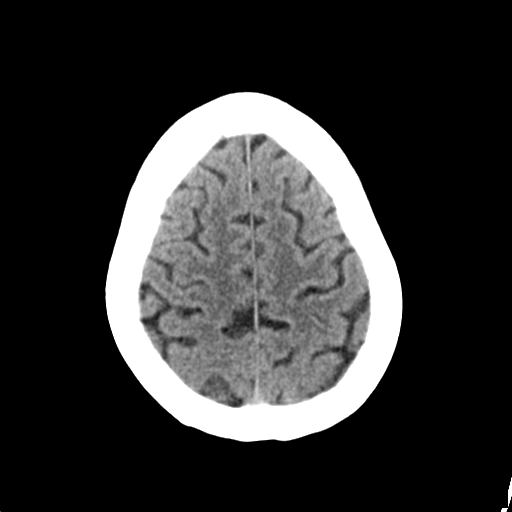
[im 25/30  brain]
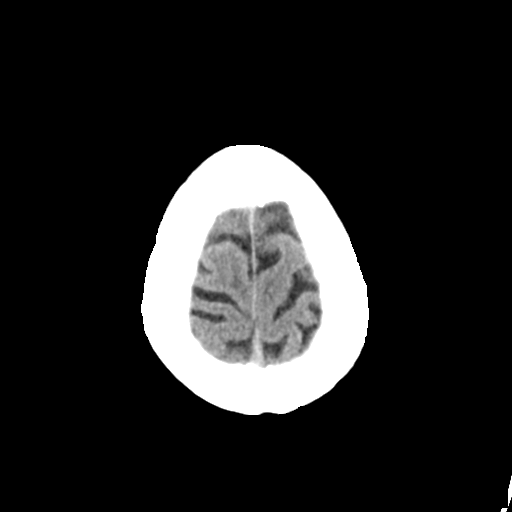
[im 28/30  brain]
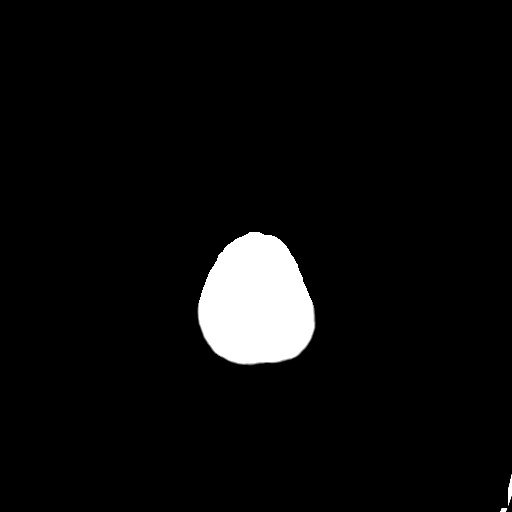
[im 28/30  bone]
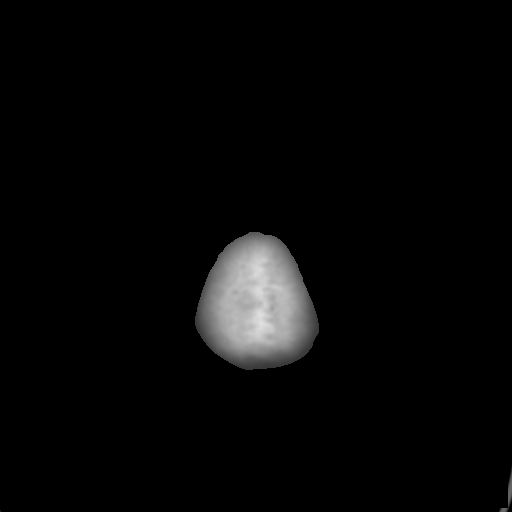

[Series 4: coronal soft tissue · coronal · 0.30mm/px · 3 of 64 slices shown]
[im 22/64  brain]
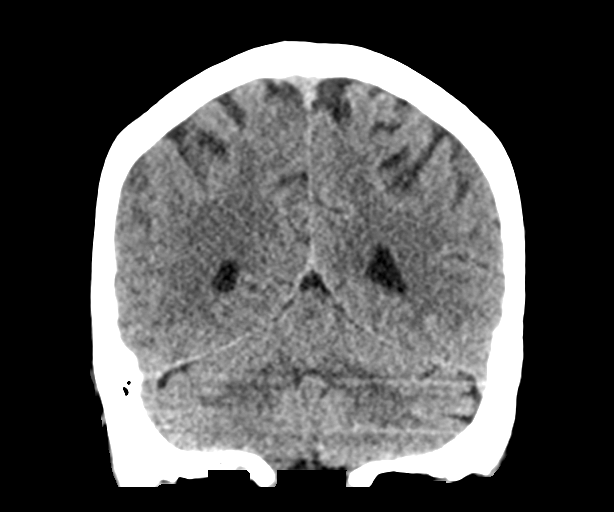
[im 29/64  brain]
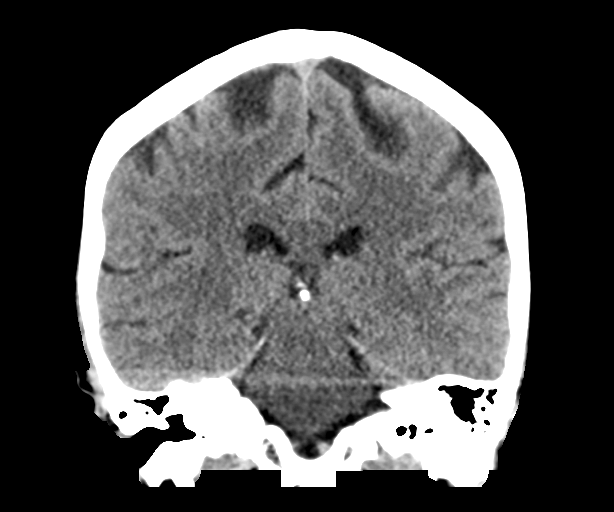
[im 36/64  brain]
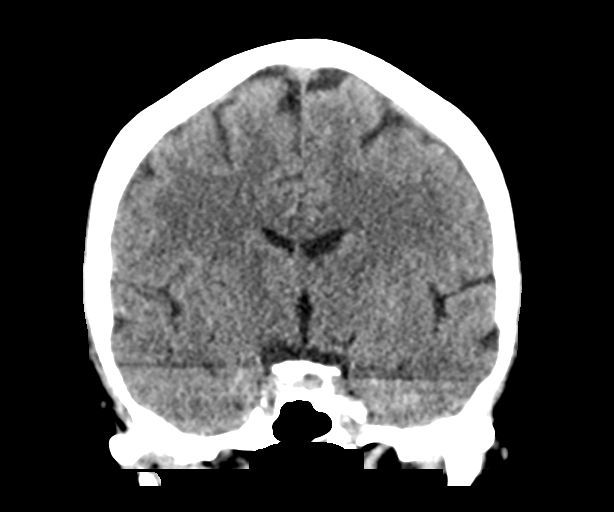

[Series 5: sagittal soft tissue · sagittal · 0.30mm/px · 3 of 46 slices shown]
[im 16/46  brain]
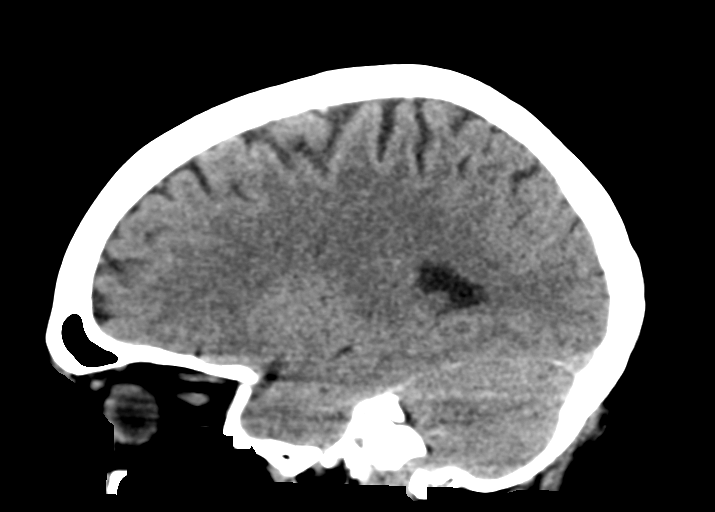
[im 23/46  brain]
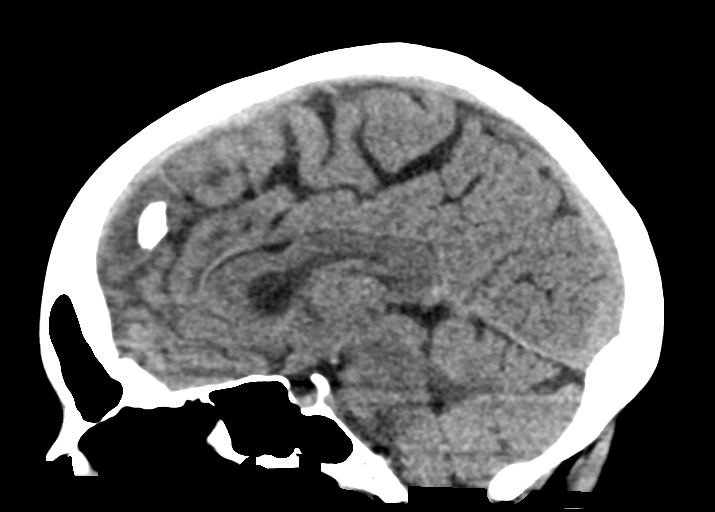
[im 31/46  brain]
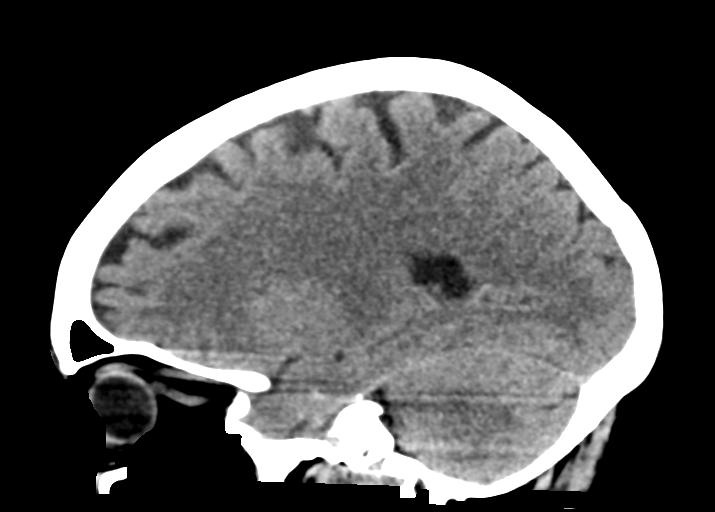

[15 of 47 positions shown; findings below may reference images not displayed]

FINDINGS: Brain: No evidence of acute infarction, hemorrhage, hydrocephalus,
extra-axial collection or mass lesion/mass effect.

Vascular: No hyperdense vessel or unexpected calcification.

Skull: Normal. Negative for fracture or focal lesion.

Sinuses/Orbits: No acute finding.

Other: None.
IMPRESSION: No acute findings.

## 2020-02-14 ENCOUNTER — Other Ambulatory Visit: Payer: Self-pay | Admitting: Family Medicine

## 2020-02-14 NOTE — Telephone Encounter (Signed)
Name of Medication: Alprazolam Name of Pharmacy: [facility] Last Fill or Written Date and Quantity: 12/28/19, #40 Last Office Visit and Type: 01/29/20, chest pain, GAD w/ Tower Next Office Visit and Type: 05/16/20, AWV prt 2 Last Controlled Substance Agreement Date: none Last UDS: none

## 2020-02-15 ENCOUNTER — Other Ambulatory Visit: Payer: Self-pay | Admitting: Family Medicine

## 2020-02-15 NOTE — Telephone Encounter (Signed)
ERx 

## 2020-02-16 ENCOUNTER — Telehealth: Payer: Self-pay | Admitting: Cardiovascular Disease

## 2020-02-16 NOTE — Telephone Encounter (Signed)
Pt was seen by Dr. Rockey Situ on 4/26. lipitor was increased and losartan started.   Swelling in feet and tingling in hands. Needing to sleep with ceiling fan on, hotter than normal.   BP 148/68 R arm 129/72 L HR 70  She denies any other issues.   Advised pt to call for any further questions or concerns.   Routing to Dr. Rockey Situ to further advise.

## 2020-02-16 NOTE — Telephone Encounter (Signed)
Pt c/o medication issue:  1. Name of Medication: losartan   2. How are you currently taking this medication (dosage and times per day)? 50 mg daily (new as of this week)  3. Are you having a reaction (difficulty breathing--STAT)? no  4. What is your medication issue? Swelling in feet, feels weird, hands feel hot and occasional chest pain. Couldn't sleep last night, ancy

## 2020-02-19 ENCOUNTER — Ambulatory Visit: Payer: PPO | Admitting: Cardiovascular Disease

## 2020-02-21 NOTE — Telephone Encounter (Signed)
Symptoms are atypical for that medication Long history of anxiety Numerous issues discussed on last clinic visit likely related to anxiety Can she give [place] some blood pressures from home

## 2020-02-22 NOTE — Telephone Encounter (Signed)
Spoke with patient and reviewed provider recommendations. She states that she is feeling some better but that since increasing atorvastatin to daily she has had increase in joint pain. She reports that she has not taken atorvastatin the past couple of days and was going to take a dose today. Recommended that she try taking it every other day to see if she tolerates it better. She was agreeable with this plan and also requested that if she did not tolerate that to please let us know so that we can help with another alternative. Also requested that she monitor her blood pressures 2 hours after medications and to call or send them through My Chart for provider to review. She verbalized understanding, agreeable to let us know if it doesn't get better, and had no further questions at this time.

## 2020-03-06 ENCOUNTER — Other Ambulatory Visit: Payer: Self-pay | Admitting: Family Medicine

## 2020-03-06 DIAGNOSIS — I6523 Occlusion and stenosis of bilateral carotid arteries: Secondary | ICD-10-CM

## 2020-03-06 NOTE — Telephone Encounter (Signed)
Spoke with patient and reviewed her BP numbers look good. She was encouraged with that but she does report that since the increase with atorvastatin taking it daily she has had persistent hip pain to the point she can't walk at times. Instructed her to hold it for 2-3 weeks and then call back if symptoms did or did not improve so we can document and discuss other options. She verbalized understanding of our conversation, agreement with plan, and had no further questions at this time.

## 2020-03-06 NOTE — Telephone Encounter (Signed)
Please call to discuss atorvastatin and lipitor. BP readings: 5/7- 123/63 5/8-144/68 5/9-130/58 5/10-134/80 5/12-147/64 5/13-129/62 5/15-150/66 This morning 133/65

## 2020-03-11 ENCOUNTER — Other Ambulatory Visit: Payer: Self-pay | Admitting: Internal Medicine

## 2020-03-11 ENCOUNTER — Telehealth: Payer: Self-pay | Admitting: Internal Medicine

## 2020-03-11 DIAGNOSIS — M25552 Pain in left hip: Secondary | ICD-10-CM

## 2020-03-11 DIAGNOSIS — M25551 Pain in right hip: Secondary | ICD-10-CM

## 2020-03-11 DIAGNOSIS — M810 Age-related osteoporosis without current pathological fracture: Secondary | ICD-10-CM

## 2020-03-11 NOTE — Telephone Encounter (Signed)
I called patient back.  She started Fosamax 1.5 months ago.  After the first meal, she developed right > left hip pain.  The left hip was hurting from before (sciatica), but increased after Fosamax.  She has problems walking at times because of the pain.  She describes the pain as high, and the inside of her leg. I advised her to stop the Fosamax. We will get x-rays of both hips to rule out atypical fractures. She did see orthopedics in the past, but not since 2017.  May need a referral.

## 2020-03-11 NOTE — Telephone Encounter (Signed)
Please review and advise.    Thank you.

## 2020-03-11 NOTE — Telephone Encounter (Signed)
Patient called saying ever since she started taking the Fosamax she's been having pain in her hips. Please advise, ph# [phone]

## 2020-03-13 DIAGNOSIS — L570 Actinic keratosis: Secondary | ICD-10-CM | POA: Diagnosis not present

## 2020-03-13 DIAGNOSIS — Z85828 Personal history of other malignant neoplasm of skin: Secondary | ICD-10-CM | POA: Diagnosis not present

## 2020-03-13 DIAGNOSIS — L814 Other melanin hyperpigmentation: Secondary | ICD-10-CM | POA: Diagnosis not present

## 2020-04-05 ENCOUNTER — Other Ambulatory Visit: Payer: Self-pay | Admitting: Family Medicine

## 2020-04-05 NOTE — Telephone Encounter (Signed)
Last seen 01/29/20 Last refill 02/15/20  Please advisee

## 2020-04-05 NOTE — Telephone Encounter (Signed)
ERx 

## 2020-04-12 DIAGNOSIS — M25551 Pain in right hip: Secondary | ICD-10-CM | POA: Diagnosis not present

## 2020-04-12 DIAGNOSIS — M1611 Unilateral primary osteoarthritis, right hip: Secondary | ICD-10-CM | POA: Diagnosis not present
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
## 2020-04-17 ENCOUNTER — Other Ambulatory Visit: Payer: Self-pay

## 2020-04-17 ENCOUNTER — Encounter: Payer: Self-pay | Admitting: Obstetrics and Gynecology

## 2020-04-17 ENCOUNTER — Ambulatory Visit (INDEPENDENT_AMBULATORY_CARE_PROVIDER_SITE_OTHER): Payer: PPO | Admitting: Obstetrics and Gynecology

## 2020-04-17 VITALS — BP 120/86 | HR 76 | Resp 16 | Ht 63.0 in | Wt 146.6 lb

## 2020-04-17 DIAGNOSIS — Z01419 Encounter for gynecological examination (general) (routine) without abnormal findings: Secondary | ICD-10-CM

## 2020-04-17 NOTE — Patient Instructions (Signed)

## 2020-05-13 ENCOUNTER — Ambulatory Visit: Payer: PPO

## 2020-05-14 ENCOUNTER — Other Ambulatory Visit: Payer: Self-pay

## 2020-05-14 ENCOUNTER — Ambulatory Visit (INDEPENDENT_AMBULATORY_CARE_PROVIDER_SITE_OTHER): Payer: PPO

## 2020-05-14 DIAGNOSIS — Z0000 Encounter for general adult medical examination without abnormal findings: Secondary | ICD-10-CM | POA: Diagnosis not present

## 2020-05-14 NOTE — Patient Instructions (Signed)
Bianca Fry , Thank you for taking time to come for your Medicare Wellness Visit. I appreciate your ongoing commitment to your health goals. Please review the following plan we discussed and let me know if I can assist you in the future.   Screening recommendations/referrals: Colonoscopy: Up to date, completed 03/06/2015, due 02/2025 Mammogram: Up to date, completed 09/08/2019, due 08/2020 Bone Density: Up to date, completed 10/05/2019, due 09/2021 Recommended yearly ophthalmology/optometry visit for glaucoma screening and checkup Recommended yearly dental visit for hygiene and checkup  Vaccinations: Influenza vaccine: Up to date, completed 07/12/2019, due 05/2020 Pneumococcal vaccine: Completed series Tdap vaccine: Up to date, completed 04/09/2011, due 03/2021 Shingles vaccine: Completed series   Covid-19:Completed series  Advanced directives: Advance directive discussed with you today. Even though you declined this today please call our office should you change your mind and we can give you the proper paperwork for you to fill out.  Conditions/risks identified: hypertension  Next appointment: Follow up in one year for your annual wellness visit    Preventive Care 36 Years and Older, Female Preventive care refers to lifestyle choices and visits with your health care provider that can promote health and wellness. What does preventive care include?  A yearly physical exam. This is also called an annual well check.  Dental exams once or twice a year.  Routine eye exams. Ask your health care provider how often you should have your eyes checked.  Personal lifestyle choices, including:  Daily care of your teeth and gums.  Regular physical activity.  Eating a healthy diet.  Avoiding tobacco and drug use.  Limiting alcohol use.  Practicing safe sex.  Taking low-dose aspirin every day.  Taking vitamin and mineral supplements as recommended by your health care provider. What happens  during an annual well check? The services and screenings done by your health care provider during your annual well check will depend on your age, overall health, lifestyle risk factors, and family history of disease. Counseling  Your health care provider may ask you questions about your:  Alcohol use.  Tobacco use.  Drug use.  Emotional well-being.  Home and relationship well-being.  Sexual activity.  Eating habits.  History of falls.  Memory and ability to understand (cognition).  Work and work Statistician.  Reproductive health. Screening  You may have the following tests or measurements:  Height, weight, and BMI.  Blood pressure.  Lipid and cholesterol levels. These may be checked every 5 years, or more frequently if you are over 60 years old.  Skin check.  Lung cancer screening. You may have this screening every year starting at age 56 if you have a 30-pack-year history of smoking and currently smoke or have quit within the past 15 years.  Fecal occult blood test (FOBT) of the stool. You may have this test every year starting at age 26.  Flexible sigmoidoscopy or colonoscopy. You may have a sigmoidoscopy every 5 years or a colonoscopy every 10 years starting at age 68.  Hepatitis C blood test.  Hepatitis B blood test.  Sexually transmitted disease (STD) testing.  Diabetes screening. This is done by checking your blood sugar (glucose) after you have not eaten for a while (fasting). You may have this done every 1-3 years.  Bone density scan. This is done to screen for osteoporosis. You may have this done starting at age 30.  Mammogram. This may be done every 1-2 years. Talk to your health care provider about how often you should have  regular mammograms. Talk with your health care provider about your test results, treatment options, and if necessary, the need for more tests. Vaccines  Your health care provider may recommend certain vaccines, such  as:  Influenza vaccine. This is recommended every year.  Tetanus, diphtheria, and acellular pertussis (Tdap, Td) vaccine. You may need a Td booster every 10 years.  Zoster vaccine. You may need this after age 55.  Pneumococcal 13-valent conjugate (PCV13) vaccine. One dose is recommended after age 50.  Pneumococcal polysaccharide (PPSV23) vaccine. One dose is recommended after age 68. Talk to your health care provider about which screenings and vaccines you need and how often you need them. This information is not intended to replace advice given to you by your health care provider. Make sure you discuss any questions you have with your health care provider. Document Released: 11/01/2015 Document Revised: 06/24/2016 Document Reviewed: 08/06/2015 Elsevier Interactive Patient Education  2017 [place] Prevention in the Home Falls can cause injuries. They can happen to people of all ages. There are many things you can do to make your home safe and to help prevent falls. What can I do on the outside of my home?  Regularly fix the edges of walkways and driveways and fix any cracks.  Remove anything that might make you trip as you walk through a door, such as a raised step or threshold.  Trim any bushes or trees on the path to your home.  Use bright outdoor lighting.  Clear any walking paths of anything that might make someone trip, such as rocks or tools.  Regularly check to see if handrails are loose or broken. Make sure that both sides of any steps have handrails.  Any raised decks and porches should have guardrails on the edges.  Have any leaves, snow, or ice cleared regularly.  Use sand or salt on walking paths during winter.  Clean up any spills in your garage right away. This includes oil or grease spills. What can I do in the bathroom?  Use night lights.  Install grab bars by the toilet and in the tub and shower. Do not use towel bars as grab bars.  Use  non-skid mats or decals in the tub or shower.  If you need to sit down in the shower, use a plastic, non-slip stool.  Keep the floor dry. Clean up any water that spills on the floor as soon as it happens.  Remove soap buildup in the tub or shower regularly.  Attach bath mats securely with double-sided non-slip rug tape.  Do not have throw rugs and other things on the floor that can make you trip. What can I do in the bedroom?  Use night lights.  Make sure that you have a light by your bed that is easy to reach.  Do not use any sheets or blankets that are too big for your bed. They should not hang down onto the floor.  Have a firm chair that has side arms. You can use this for support while you get dressed.  Do not have throw rugs and other things on the floor that can make you trip. What can I do in the kitchen?  Clean up any spills right away.  Avoid walking on wet floors.  Keep items that you use a lot in easy-to-reach places.  If you need to reach something above you, use a strong step stool that has a grab bar.  Keep electrical cords out of the  way.  Do not use floor polish or wax that makes floors slippery. If you must use wax, use non-skid floor wax.  Do not have throw rugs and other things on the floor that can make you trip. What can I do with my stairs?  Do not leave any items on the stairs.  Make sure that there are handrails on both sides of the stairs and use them. Fix handrails that are broken or loose. Make sure that handrails are as long as the stairways.  Check any carpeting to make sure that it is firmly attached to the stairs. Fix any carpet that is loose or worn.  Avoid having throw rugs at the top or bottom of the stairs. If you do have throw rugs, attach them to the floor with carpet tape.  Make sure that you have a light switch at the top of the stairs and the bottom of the stairs. If you do not have them, ask someone to add them for you. What  else can I do to help prevent falls?  Wear shoes that:  Do not have high heels.  Have rubber bottoms.  Are comfortable and fit you well.  Are closed at the toe. Do not wear sandals.  If you use a stepladder:  Make sure that it is fully opened. Do not climb a closed stepladder.  Make sure that both sides of the stepladder are locked into place.  Ask someone to hold it for you, if possible.  Clearly mark and make sure that you can see:  Any grab bars or handrails.  First and last steps.  Where the edge of each step is.  Use tools that help you move around (mobility aids) if they are needed. These include:  Canes.  Walkers.  Scooters.  Crutches.  Turn on the lights when you go into a dark area. Replace any light bulbs as soon as they burn out.  Set up your furniture so you have a clear path. Avoid moving your furniture around.  If any of your floors are uneven, fix them.  If there are any pets around you, be aware of where they are.  Review your medicines with your doctor. Some medicines can make you feel dizzy. This can increase your chance of falling. Ask your doctor what other things that you can do to help prevent falls. This information is not intended to replace advice given to you by your health care provider. Make sure you discuss any questions you have with your health care provider. Document Released: 08/01/2009 Document Revised: 03/12/2016 Document Reviewed: 11/09/2014 Elsevier Interactive Patient Education  2017 [employer].
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
## 2020-05-14 NOTE — Progress Notes (Signed)
PCP notes:  Health Maintenance: No gaps noted   Abnormal Screenings: none   Patient concerns: Ankles swelling   Nurse concerns: none   Next PCP appt.: 05/16/2020 @ 9:30 am

## 2020-05-16 ENCOUNTER — Encounter: Payer: Self-pay | Admitting: Family Medicine

## 2020-05-16 ENCOUNTER — Other Ambulatory Visit: Payer: Self-pay

## 2020-05-16 ENCOUNTER — Ambulatory Visit (INDEPENDENT_AMBULATORY_CARE_PROVIDER_SITE_OTHER): Payer: PPO | Admitting: Family Medicine

## 2020-05-16 VITALS — BP 124/68 | HR 69 | Temp 97.9°F | Ht 63.5 in | Wt 150.0 lb

## 2020-05-16 DIAGNOSIS — I739 Peripheral vascular disease, unspecified: Secondary | ICD-10-CM

## 2020-05-16 DIAGNOSIS — N3946 Mixed incontinence: Secondary | ICD-10-CM

## 2020-05-16 DIAGNOSIS — I6522 Occlusion and stenosis of left carotid artery: Secondary | ICD-10-CM | POA: Diagnosis not present

## 2020-05-16 DIAGNOSIS — R7303 Prediabetes: Secondary | ICD-10-CM | POA: Diagnosis not present

## 2020-05-16 DIAGNOSIS — E782 Mixed hyperlipidemia: Secondary | ICD-10-CM | POA: Diagnosis not present

## 2020-05-16 DIAGNOSIS — Z7189 Other specified counseling: Secondary | ICD-10-CM | POA: Diagnosis not present

## 2020-05-16 DIAGNOSIS — I25119 Atherosclerotic heart disease of native coronary artery with unspecified angina pectoris: Secondary | ICD-10-CM | POA: Diagnosis not present

## 2020-05-16 DIAGNOSIS — E559 Vitamin D deficiency, unspecified: Secondary | ICD-10-CM

## 2020-05-16 DIAGNOSIS — M810 Age-related osteoporosis without current pathological fracture: Secondary | ICD-10-CM

## 2020-05-16 DIAGNOSIS — N390 Urinary tract infection, site not specified: Secondary | ICD-10-CM | POA: Diagnosis not present

## 2020-05-16 DIAGNOSIS — Z0000 Encounter for general adult medical examination without abnormal findings: Secondary | ICD-10-CM | POA: Diagnosis not present

## 2020-05-16 DIAGNOSIS — I10 Essential (primary) hypertension: Secondary | ICD-10-CM | POA: Diagnosis not present

## 2020-05-16 DIAGNOSIS — K2100 Gastro-esophageal reflux disease with esophagitis, without bleeding: Secondary | ICD-10-CM | POA: Diagnosis not present

## 2020-05-16 DIAGNOSIS — K582 Mixed irritable bowel syndrome: Secondary | ICD-10-CM

## 2020-05-16 DIAGNOSIS — D509 Iron deficiency anemia, unspecified: Secondary | ICD-10-CM

## 2020-05-16 LAB — COMPREHENSIVE METABOLIC PANEL WITH GFR
ALT: 11 U/L (ref 0–35)
AST: 16 U/L (ref 0–37)
Albumin: 4 g/dL (ref 3.5–5.2)
Alkaline Phosphatase: 79 U/L (ref 39–117)
BUN: 17 mg/dL (ref 6–23)
CO2: 29 meq/L (ref 19–32)
Calcium: 9.4 mg/dL (ref 8.4–10.5)
Chloride: 105 meq/L (ref 96–112)
Creatinine, Ser: 0.95 mg/dL (ref 0.40–1.20)
GFR: 57.28 mL/min — ABNORMAL LOW
Glucose, Bld: 93 mg/dL (ref 70–99)
Potassium: 4.6 meq/L (ref 3.5–5.1)
Sodium: 140 meq/L (ref 135–145)
Total Bilirubin: 0.5 mg/dL (ref 0.2–1.2)
Total Protein: 6.7 g/dL (ref 6.0–8.3)

## 2020-05-16 LAB — LIPID PANEL
Cholesterol: 157 mg/dL (ref 0–200)
HDL: 57.8 mg/dL
LDL Cholesterol: 70 mg/dL (ref 0–99)
NonHDL: 98.99
Total CHOL/HDL Ratio: 3
Triglycerides: 147 mg/dL (ref 0.0–149.0)
VLDL: 29.4 mg/dL (ref 0.0–40.0)

## 2020-05-16 LAB — CBC WITH DIFFERENTIAL/PLATELET
Basophils Absolute: 0 10*3/uL (ref 0.0–0.1)
Basophils Relative: 0.2 % (ref 0.0–3.0)
Eosinophils Absolute: 0.2 10*3/uL (ref 0.0–0.7)
Eosinophils Relative: 3.1 % (ref 0.0–5.0)
HCT: 36.1 % (ref 36.0–46.0)
Hemoglobin: 11.7 g/dL — ABNORMAL LOW (ref 12.0–15.0)
Lymphocytes Relative: 36 % (ref 12.0–46.0)
Lymphs Abs: 2.5 10*3/uL (ref 0.7–4.0)
MCHC: 32.4 g/dL (ref 30.0–36.0)
MCV: 83.5 fl (ref 78.0–100.0)
Monocytes Absolute: 0.7 10*3/uL (ref 0.1–1.0)
Monocytes Relative: 9.5 % (ref 3.0–12.0)
Neutro Abs: 3.5 10*3/uL (ref 1.4–7.7)
Neutrophils Relative %: 51.2 % (ref 43.0–77.0)
Platelets: 145 10*3/uL — ABNORMAL LOW (ref 150.0–400.0)
RBC: 4.32 Mil/uL (ref 3.87–5.11)
RDW: 14.9 % (ref 11.5–15.5)
WBC: 6.9 10*3/uL (ref 4.0–10.5)

## 2020-05-16 LAB — VITAMIN D 25 HYDROXY (VIT D DEFICIENCY, FRACTURES): VITD: 40.11 ng/mL (ref 30.00–100.00)

## 2020-05-16 LAB — HEMOGLOBIN A1C: Hgb A1c MFr Bld: 6.6 % — ABNORMAL HIGH (ref 4.6–6.5)

## 2020-05-16 NOTE — Patient Instructions (Addendum)
Labs today  Work on setting up living will.  You are doing well today.  Return as needed or in 1 year for next physical.   Health Maintenance After Age 75 After age 60, you are at a higher risk for certain long-term diseases and infections as well as injuries from falls. Falls are a major cause of broken bones and head injuries in people who are older than age 44. Getting regular preventive care can help to keep you healthy and well. Preventive care includes getting regular testing and making lifestyle changes as recommended by your health care provider. Talk with your health care provider about:  Which screenings and tests you should have. A screening is a test that checks for a disease when you have no symptoms.  A diet and exercise plan that is right for you. What should I know about screenings and tests to prevent falls? Screening and testing are the best ways to find a health problem early. Early diagnosis and treatment give you the best chance of managing medical conditions that are common after age 65. Certain conditions and lifestyle choices may make you more likely to have a fall. Your health care provider may recommend:  Regular vision checks. Poor vision and conditions such as cataracts can make you more likely to have a fall. If you wear glasses, make sure to get your prescription updated if your vision changes.  Medicine review. Work with your health care provider to regularly review all of the medicines you are taking, including over-the-counter medicines. Ask your health care provider about any side effects that may make you more likely to have a fall. Tell your health care provider if any medicines that you take make you feel dizzy or sleepy.  Osteoporosis screening. Osteoporosis is a condition that causes the bones to get weaker. This can make the bones weak and cause them to break more easily.  Blood pressure screening. Blood pressure changes and medicines to control blood  pressure can make you feel dizzy.  Strength and balance checks. Your health care provider may recommend certain tests to check your strength and balance while standing, walking, or changing positions.  Foot health exam. Foot pain and numbness, as well as not wearing proper footwear, can make you more likely to have a fall.  Depression screening. You may be more likely to have a fall if you have a fear of falling, feel emotionally low, or feel unable to do activities that you used to do.  Alcohol use screening. Using too much alcohol can affect your balance and may make you more likely to have a fall. What actions can I take to lower my risk of falls? General instructions  Talk with your health care provider about your risks for falling. Tell your health care provider if: ? You fall. Be sure to tell your health care provider about all falls, even ones that seem minor. ? You feel dizzy, sleepy, or off-balance.  Take over-the-counter and prescription medicines only as told by your health care provider. These include any supplements.  Eat a healthy diet and maintain a healthy weight. A healthy diet includes low-fat dairy products, low-fat (lean) meats, and fiber from whole grains, beans, and lots of fruits and vegetables. Home safety  Remove any tripping hazards, such as rugs, cords, and clutter.  Install safety equipment such as grab bars in bathrooms and safety rails on stairs.  Keep rooms and walkways well-lit. Activity   Follow a regular exercise program to stay  fit. This will help you maintain your balance. Ask your health care provider what types of exercise are appropriate for you.  If you need a cane or walker, use it as recommended by your health care provider.  Wear supportive shoes that have nonskid soles. Lifestyle  Do not drink alcohol if your health care provider tells you not to drink.  If you drink alcohol, limit how much you have: ? 0-1 drink a day for  women. ? 0-2 drinks a day for men.  Be aware of how much alcohol is in your drink. In the [place] one drink equals one typical bottle of beer (12 oz), one-half glass of wine (5 oz), or one shot of hard liquor (1 oz).  Do not use any products that contain nicotine or tobacco, such as cigarettes and e-cigarettes. If you need help quitting, ask your health care provider. Summary  Having a healthy lifestyle and getting preventive care can help to protect your health and wellness after age 66.  Screening and testing are the best way to find a health problem early and help you avoid having a fall. Early diagnosis and treatment give you the best chance for managing medical conditions that are more common for people who are older than age 13.  Falls are a major cause of broken bones and head injuries in people who are older than age 64. Take precautions to prevent a fall at home.  Work with your health care provider to learn what changes you can make to improve your health and wellness and to prevent falls. This information is not intended to replace advice given to you by your health care provider. Make sure you discuss any questions you have with your health care provider. Document Revised: 01/26/2019 Document Reviewed: 08/18/2017 Elsevier Patient Education  2020 [employer].

## 2020-05-16 NOTE — Assessment & Plan Note (Signed)
Continue aspirin, statin.  

## 2020-05-16 NOTE — Assessment & Plan Note (Signed)
Chronic, update labs on lipitor.  The 10-year ASCVD risk score Mikey Bussing DC Brooke Bonito., et al., 2013) is: 19.6%   Values used to calculate the score:     Age: 74 years     Sex: Female     Is Non-Hispanic African American: No     Diabetic: No     Tobacco smoker: No     Systolic Blood Pressure: 784 mmHg     Is BP treated: Yes     HDL Cholesterol: 57 mg/dL     Total Cholesterol: 171 mg/dL

## 2020-05-16 NOTE — Assessment & Plan Note (Signed)
Continue statin, aspirin 

## 2020-05-16 NOTE — Assessment & Plan Note (Addendum)
Reviewed recent DEXA and endo plan. Now off fosamax. Seeing ortho for worsening R hip pain presumed due to OA told needed hip replacement.

## 2020-05-16 NOTE — Assessment & Plan Note (Signed)
Preventative protocols reviewed and updated unless pt declined. Discussed healthy diet and lifestyle.  

## 2020-05-16 NOTE — Assessment & Plan Note (Signed)
Update CBC. 

## 2020-05-16 NOTE — Assessment & Plan Note (Addendum)
Sees urology. Did have PFPT 2017

## 2020-05-16 NOTE — Assessment & Plan Note (Signed)
Latest [place] 11/2019 reviewed.

## 2020-05-16 NOTE — Assessment & Plan Note (Signed)
Continues daily PPI.  

## 2020-05-16 NOTE — Progress Notes (Signed)
This visit was conducted in person.  BP 124/68 (BP Location: Left Arm, Patient Position: Sitting, Cuff Size: Normal)   Pulse 69   Temp 97.9 F (36.6 C) (Temporal)   Ht 5' 3.5" (1.613 m)   Wt 150 lb (68 kg)   LMP 10/20/1979 (Within Years)   SpO2 97%   BMI 26.15 kg/m    CC: CPE Subjective:    Patient ID: Bianca Fry, female    DOB: [birth year], 74 y.o.   MRN: [id number]  HPI: DAISEY Fry is a 74 y.o. female presenting on 05/16/2020 for Annual Exam (PART 2)   Saw health advisor this week for medicare wellness visit. Note reviewed.    No exam data present    Clinical Support from 05/14/2020 in [facility] [place]  PHQ-2 Total Score 0      Fall Risk  05/14/2020 09/13/2019 05/05/2019 04/20/2018 04/14/2017  Falls in the past year? 0 0 0 No No  Comment - Emmi Telephone Survey: data to providers prior to load - - -  Number falls in past yr: 0 - - - -  Injury with Fall? 0 - - - -  Risk Factor Category  - - - - -  Comment - - - - -  Risk for fall due to : Medication side effect - Medication side effect - -  Follow up Falls evaluation completed;Falls prevention discussed - Falls prevention discussed;Falls evaluation completed - -   Osteopenia - fosamax started March ([place]) - caused R hip pain so she stopped. Saw ortho, found to have bone spur - treated with steroid injection hip joint with temporary relief. Started on mobic 7.5mg  with benefit. May need R hip replacement. Notes some pedal edema as well.   Known L carotid stenosis. On losartan and lipitor. Trial off lipitor didn't help hip pain.   Chronic UTI - sees urology Amalia Hailey) on keflex daily.   Preventative: COLONOSCOPY Date: 03/2015 mod diverticulosis with focal colitis [facility] Plan) rpt 10 yrs  Breast cancer screening - mammo 08/2019 Birads1 Well woman exam - yearlyGYNDr Dietrich Pates Silva(01/2019). S/p hysterectomy. On estrace cream. DEXA 01/2015 - T score -2.3 R hip, -1.6 spine DEXA 09/2019 -  T score -2.3 at R hip,  -1.5 spine.  Taking calcium and vitamin D daily. Hip pain to bisphosphonate. Encouraged regular weight bearing exercises  Flu shot yearly  Tdap 2012  COVID vaccine - completed [place] 12/2019 Pneumovax 2010, prevnar 2015, pnemovax6/2017 zostavax- 07/2010 Shingrix -completed 2019 Advanced directive discussion - Has not set up. Would want husband to be HCPOA. Packet previously provided. Seat belt use discussed.  Sunscreen use discussed,no changing moles on skin,sees derm  Non smoker  Alcohol - none Dentist - Q6 mo Eye exam - yearly Bowel - irregular  Bladder - no incontinence - doing well on keflex.   Daily caffeine  Lives with husband and 1 dog and 3 cats  Occupation: retired, was in [employer]  Activity: walking20-30 min/day Diet: good water, fruits/vegetables daily     Relevant past medical, surgical, family and social history reviewed and updated as indicated. Interim medical history since our last visit reviewed. Allergies and medications reviewed and updated. Outpatient Medications Prior to Visit  Medication Sig Dispense Refill  . ALPRAZolam (XANAX) 0.5 MG tablet TAKE 1 TABLET BY MOUTH AT BEDTIME AS NEEDED FOR ANXIETY 40 tablet 0  . aspirin EC 81 MG EC tablet Take 1 tablet (81 mg total) by mouth daily. 30 tablet 2  . atenolol (TENORMIN)  25 MG tablet Take 1 tablet by mouth once daily 90 tablet 3  . Calcium Carb-Cholecalciferol (CALCIUM-VITAMIN D) 600-400 MG-UNIT TABS Take 1 tablet by mouth daily.    . cephALEXin (KEFLEX) 250 MG capsule Take 250 mg by mouth daily.  99  . Cholecalciferol (VITAMIN D3 PO) Take by mouth.    . clindamycin (CLEOCIN) 150 MG capsule TAKE FOUR CAPSULES BY MOUTH ONE HOUR BEFORE APPOINTMENT    . dicyclomine (BENTYL) 10 MG capsule TAKE 1 CAPSULE BY MOUTH TWICE DAILY AS NEEDED FOR  SPASMS 90 capsule 1  . estradiol (ESTRACE) 0.1 MG/GM vaginal cream USE ONE-HALF GRAM VAGINALLY THREE TIMES A WEEK 43 g 0  . fexofenadine (ALLEGRA) 180 MG tablet Take 180  mg by mouth daily as needed for allergies.     Marland Kitchen gabapentin (NEURONTIN) 100 MG capsule Take 1 capsule (100 mg total) by mouth 3 (three) times daily. 60 capsule 0  . meloxicam (MOBIC) 7.5 MG tablet Take 7.5 mg by mouth daily.    . mupirocin ointment (BACTROBAN) 2 % Apply 1 application topically 2 (two) times daily. (Patient taking differently: Apply 1 application topically 2 (two) times daily as needed. ) 22 g 0  . omeprazole (PRILOSEC) 40 MG capsule Take 1 capsule by mouth once daily 90 capsule 3  . ondansetron (ZOFRAN) 8 MG tablet TAKE 1 TABLET BY MOUTH EVERY 8 HOURS AS NEEDED FOR NAUSEA AND VOMITING 90 tablet 0  . triamcinolone cream (KENALOG) 0.1 % APPLY  CREAM TOPICALLY TWICE DAILY. LIMIT TO 2 WEEKS DURATION 30 g 0  . valACYclovir (VALTREX) 1000 MG tablet TAKE 2 TABLETS BY MOUTH IN THE MORNING AND 2 IN THE EVENING FOR 1 DAY ONLY AS NEEDEED FOR FLARE    . meloxicam (MOBIC) 7.5 MG tablet Take by mouth.    Marland Kitchen atorvastatin (LIPITOR) 10 MG tablet Take 1 tablet (10 mg total) by mouth daily. 90 tablet 3  . losartan (COZAAR) 50 MG tablet Take 1 tablet (50 mg total) by mouth daily. 90 tablet 3   No facility-administered medications prior to visit.     Per HPI unless specifically indicated in ROS section below Review of Systems  Constitutional: Negative for activity change, appetite change, chills, fatigue, fever and unexpected weight change.  HENT: Negative for hearing loss.   Eyes: Negative for visual disturbance.  Respiratory: Negative for cough, chest tightness, shortness of breath and wheezing.   Cardiovascular: Positive for leg swelling (see HPI). Negative for chest pain and palpitations.  Gastrointestinal: Positive for constipation and diarrhea. Negative for abdominal distention, abdominal pain, blood in stool, nausea and vomiting.  Genitourinary: Negative for difficulty urinating and hematuria.  Musculoskeletal: Negative for arthralgias, myalgias and neck pain.  Skin: Negative for rash.    Neurological: Negative for dizziness, seizures, syncope and headaches.  Hematological: Negative for adenopathy. Does not bruise/bleed easily.  Psychiatric/Behavioral: Negative for dysphoric mood. The patient is not nervous/anxious.    Objective:  BP 124/68 (BP Location: Left Arm, Patient Position: Sitting, Cuff Size: Normal)   Pulse 69   Temp 97.9 F (36.6 C) (Temporal)   Ht 5' 3.5" (1.613 m)   Wt 150 lb (68 kg)   LMP 10/20/1979 (Within Years)   SpO2 97%   BMI 26.15 kg/m   Wt Readings from Last 3 Encounters:  05/16/20 150 lb (68 kg)  04/17/20 146 lb 9.6 oz (66.5 kg)  02/12/20 151 lb 8 oz (68.7 kg)      Physical Exam Vitals and nursing note reviewed.  Constitutional:  General: She is not in acute distress.    Appearance: Normal appearance. She is well-developed. She is not ill-appearing.  HENT:     Head: Normocephalic and atraumatic.     Right Ear: Hearing, tympanic membrane, ear canal and external ear normal.     Left Ear: Hearing, tympanic membrane, ear canal and external ear normal.  Eyes:     General: No scleral icterus.    Extraocular Movements: Extraocular movements intact.     Conjunctiva/sclera: Conjunctivae normal.     Pupils: Pupils are equal, round, and reactive to light.  Cardiovascular:     Rate and Rhythm: Normal rate and regular rhythm.     Pulses: Normal pulses.          Radial pulses are 2+ on the right side and 2+ on the left side.     Heart sounds: Normal heart sounds. No murmur heard.   Pulmonary:     Effort: Pulmonary effort is normal. No respiratory distress.     Breath sounds: Normal breath sounds. No wheezing, rhonchi or rales.  Abdominal:     General: Abdomen is flat. Bowel sounds are normal. There is no distension.     Palpations: Abdomen is soft. There is no mass.     Tenderness: There is no abdominal tenderness. There is no guarding or rebound.     Hernia: No hernia is present.  Musculoskeletal:        General: Normal range of motion.      Cervical back: Normal range of motion and neck supple.     Right lower leg: No edema.     Left lower leg: No edema.  Lymphadenopathy:     Cervical: No cervical adenopathy.  Skin:    General: Skin is warm and dry.     Findings: No rash.  Neurological:     General: No focal deficit present.     Mental Status: She is alert and oriented to person, place, and time.     Comments: CN grossly intact, station and gait intact  Psychiatric:        Mood and Affect: Mood normal.        Behavior: Behavior normal.        Thought Content: Thought content normal.        Judgment: Judgment normal.       Results for orders placed or performed in visit on 63/78/58  Basic metabolic panel  Result Value Ref Range   Sodium 138 135 - 145 mEq/L   Potassium 4.4 3.5 - 5.1 mEq/L   Chloride 104 96 - 112 mEq/L   CO2 29 19 - 32 mEq/L   Glucose, Bld 90 70 - 99 mg/dL   BUN 16 6 - 23 mg/dL   Creatinine, Ser 0.89 0.40 - 1.20 mg/dL   GFR 61.82 >60.00 mL/min   Calcium 9.4 8.4 - 10.5 mg/dL  VITAMIN D 25 Hydroxy (Vit-D Deficiency, Fractures)  Result Value Ref Range   VITD 34.82 30.00 - 100.00 ng/mL   Assessment & Plan:  This visit occurred during the SARS-CoV-2 public health emergency.  Safety protocols were in place, including screening questions prior to the visit, additional usage of staff PPE, and extensive cleaning of exam room while observing appropriate contact time as indicated for disinfecting solutions.   Problem List Items Addressed This Visit    Vitamin D deficiency   Relevant Orders   VITAMIN D 25 Hydroxy (Vit-D Deficiency, Fractures)   Recurrent UTI    Continues daily  keflex through urology.       Prediabetes    Update A1c.       Relevant Orders   Hemoglobin A1c   PAD (peripheral artery disease) (HCC)    Continue aspirin, statin.       Osteoporosis    Reviewed recent DEXA and endo plan. Now off fosamax. Seeing ortho for worsening R hip pain presumed due to OA told needed hip  replacement.      Mixed incontinence    Sees urology. Did have PFPT 2017      Left carotid artery stenosis    Latest [place] 11/2019 reviewed.       Irritable bowel syndrome (IBS)   Iron deficiency anemia    Update CBC.       Relevant Orders   CBC with Differential/Platelet   HLD (hyperlipidemia)    Chronic, update labs on lipitor.  The 10-year ASCVD risk score Mikey Bussing DC Brooke Bonito., et al., 2013) is: 19.6%   Values used to calculate the score:     Age: 53 years     Sex: Female     Is Non-Hispanic African American: No     Diabetic: No     Tobacco smoker: No     Systolic Blood Pressure: 944 mmHg     Is BP treated: Yes     HDL Cholesterol: 57 mg/dL     Total Cholesterol: 171 mg/dL       Relevant Orders   Comprehensive metabolic panel   Lipid panel   Health maintenance examination - Primary    Preventative protocols reviewed and updated unless pt declined. Discussed healthy diet and lifestyle.       GERD    Continues daily PPI.       Essential hypertension    Chronic, stable. Continue current regimen.       CAD (coronary artery disease)    Continue statin, aspirin.       Advanced care planning/counseling discussion    Advanced directive discussion - Has not set up. Would want husband to be HCPOA. Packet previously provided.          No orders of the defined types were placed in this encounter.  Orders Placed This Encounter  Procedures  . Comprehensive metabolic panel  . Lipid panel  . Hemoglobin A1c  . CBC with Differential/Platelet  . VITAMIN D 25 Hydroxy (Vit-D Deficiency, Fractures)    Patient instructions: Labs today  Work on setting up living will.  You are doing well today.  Return as needed or in 1 year for next physical.   Her computer is down - requests phone call or letter with lab results.   Follow up plan: Return in about 1 year (around 05/16/2021) for annual exam, prior fasting for blood work, medicare wellness visit.  Ria Bush, MD

## 2020-05-16 NOTE — Assessment & Plan Note (Signed)
Advanced directive discussion - Has not set up. Would want husband to be HCPOA. Packet previously provided.

## 2020-05-16 NOTE — Assessment & Plan Note (Addendum)
Continues daily keflex through urology.

## 2020-05-16 NOTE — Assessment & Plan Note (Signed)
Chronic, stable. Continue current regimen. 

## 2020-05-16 NOTE — Assessment & Plan Note (Signed)
Update A1c ?

## 2020-05-20 ENCOUNTER — Other Ambulatory Visit: Payer: Self-pay | Admitting: Family Medicine

## 2020-05-22 DIAGNOSIS — H02051 Trichiasis without entropian right upper eyelid: Secondary | ICD-10-CM | POA: Diagnosis not present

## 2020-05-28 ENCOUNTER — Other Ambulatory Visit: Payer: Self-pay | Admitting: Family Medicine

## 2020-05-28 NOTE — Telephone Encounter (Signed)
ERx 

## 2020-06-13 DIAGNOSIS — N952 Postmenopausal atrophic vaginitis: Secondary | ICD-10-CM | POA: Diagnosis not present

## 2020-06-13 DIAGNOSIS — R3915 Urgency of urination: Secondary | ICD-10-CM | POA: Diagnosis not present

## 2020-06-13 DIAGNOSIS — Z87440 Personal history of urinary (tract) infections: Secondary | ICD-10-CM | POA: Diagnosis not present

## 2020-06-13 DIAGNOSIS — R102 Pelvic and perineal pain: Secondary | ICD-10-CM | POA: Diagnosis not present

## 2020-06-13 DIAGNOSIS — R3989 Other symptoms and signs involving the genitourinary system: Secondary | ICD-10-CM | POA: Diagnosis not present

## 2020-06-17 DIAGNOSIS — H02051 Trichiasis without entropian right upper eyelid: Secondary | ICD-10-CM | POA: Diagnosis not present

## 2020-06-25 ENCOUNTER — Encounter: Payer: Self-pay | Admitting: Family

## 2020-06-25 ENCOUNTER — Other Ambulatory Visit: Payer: Self-pay

## 2020-06-25 ENCOUNTER — Ambulatory Visit: Payer: PPO | Admitting: Family

## 2020-06-25 VITALS — BP 120/60 | HR 69 | Ht 64.0 in | Wt 147.0 lb

## 2020-06-25 DIAGNOSIS — E782 Mixed hyperlipidemia: Secondary | ICD-10-CM

## 2020-06-25 DIAGNOSIS — I10 Essential (primary) hypertension: Secondary | ICD-10-CM | POA: Diagnosis not present

## 2020-06-25 DIAGNOSIS — M1611 Unilateral primary osteoarthritis, right hip: Secondary | ICD-10-CM | POA: Diagnosis not present

## 2020-06-25 DIAGNOSIS — R002 Palpitations: Secondary | ICD-10-CM | POA: Diagnosis not present

## 2020-06-25 DIAGNOSIS — M5416 Radiculopathy, lumbar region: Secondary | ICD-10-CM | POA: Diagnosis not present

## 2020-06-25 NOTE — Patient Instructions (Signed)
Medication Instructions:  No medication changes today.   *If you need a refill on your cardiac medications before your next appointment, please call your pharmacy*   Lab Work: No lab work today  Testing/Procedures: Your EKG today shows normal sinus rhythm.  Follow-Up: At [facility], you and your health needs are our priority.  As part of our continuing mission to provide you with exceptional heart care, we have created designated Provider Care Teams.  These Care Teams include your primary Cardiologist (physician) and Advanced Practice Providers (APPs -  Physician Assistants and Nurse Practitioners) who all work together to provide you with the care you need, when you need it.  We recommend signing up for the patient portal called "MyChart".  Sign up information is provided on this After Visit Summary.  MyChart is used to connect with patients for Virtual Visits (Telemedicine).  Patients are able to view lab/test results, encounter notes, upcoming appointments, etc.  Non-urgent messages can be sent to your provider as well.   To learn more about what you can do with MyChart, go to [email or web address].    Your next appointment:   In April 2022 with Dr. Rockey Situ as previously recommended  Other Instructions  Your palpitations sound a lot like something called PVCs or premature ventricular contractions. Additional information is below. If you wish to have a monitor placed, don't hesitate to call us.   Premature Ventricular Contraction  A premature ventricular contraction (PVC) is a common kind of irregular heartbeat (arrhythmia). These contractions are extra heartbeats that start in the ventricles of the heart and occur too early in the normal sequence. During the PVC, the heart's normal electrical pathway is not used, so the beat is shorter and less effective. In most cases, these contractions come and go and do not require treatment. What are the causes? Common causes of the  condition include:  Smoking.  Drinking alcohol.  Certain medicines.  Some illegal drugs.  Stress.  Caffeine. Certain medical conditions can also cause PVCs:  Heart failure.  Heart attack, or coronary artery disease.  Heart valve problems.  Changes in minerals in the blood (electrolytes).  Low blood oxygen levels or high carbon dioxide levels. In many cases, the cause of this condition is not known. What are the signs or symptoms? The main symptom of this condition is fast or skipped heartbeats (palpitations). Other symptoms include:  Chest pain.  Shortness of breath.  Feeling tired.  Dizziness.  Difficulty exercising. In some cases, there are no symptoms. How is this diagnosed? This condition may be diagnosed based on:  Your medical history.  A physical exam. During the exam, the health care provider will check for irregular heartbeats.  Tests, such as: ? An ECG (electrocardiogram) to monitor the electrical activity of your heart. ? An ambulatory cardiac monitor. This device records your heartbeats for 24 hours or more. How is this treated? Treatment for this condition depends on any underlying conditions, the type of PVCs that you are having, and how much the symptoms are interfering with your daily life. Possible treatments include:  Avoiding things that cause premature contractions (triggers). These include caffeine and alcohol.  Taking medicines if symptoms are severe or if the extra heartbeats are frequent.  Getting treatment for underlying conditions that cause PVCs.  Having an implantable cardioverter defibrillator (ICD), if you are at risk for a serious arrhythmia. The ICD is a small device that is inserted into your chest to monitor your heartbeat. When it senses an irregular  heartbeat, it sends a shock to bring the heartbeat back to normal.  Having a procedure to destroy the portion of the heart tissue that sends out abnormal signals (catheter  ablation). In some cases, no treatment is required. Follow these instructions at home: Lifestyle  Do not use any products that contain nicotine or tobacco, such as cigarettes, e-cigarettes, and chewing tobacco. If you need help quitting, ask your health care provider.  Do not use illegal drugs.  Exercise regularly. Ask your health care provider what type of exercise is safe for you.  Try to get at least 7-9 hours of sleep each night, or as much as recommended by your health care provider.  Find healthy ways to manage stress. Avoid stressful situations when possible. Alcohol use  Do not drink alcohol if: ? Your health care provider tells you not to drink. ? You are pregnant, may be pregnant, or are planning to become pregnant. ? Alcohol triggers your episodes.  If you drink alcohol: ? Limit how much you use to:  0-1 drink a day for women.  0-2 drinks a day for men.  Be aware of how much alcohol is in your drink. In the [place] one drink equals one 12 oz bottle of beer (355 mL), one 5 oz glass of wine (148 mL), or one 1 oz glass of hard liquor (44 mL). General instructions  Take over-the-counter and prescription medicines only as told by your health care provider.  If caffeine triggers episodes of PVC, do not eat, drink, or use anything with caffeine in it.  Keep all follow-up visits as told by your health care provider. This is important. Contact a health care provider if you:  Feel palpitations. Get help right away if you:  Have chest pain.  Have shortness of breath.  Have sweating for no reason.  Have nausea and vomiting.  Become light-headed or you faint. Summary  A premature ventricular contraction (PVC) is a common kind of irregular heartbeat (arrhythmia).  In most cases, these contractions come and go and do not require treatment.  You may need to wear an ambulatory cardiac monitor. This records your heartbeats for 24 hours or more.  Treatment depends on  any underlying conditions, the type of PVCs that you are having, and how much the symptoms are interfering with your daily life. This information is not intended to replace advice given to you by your health care provider. Make sure you discuss any questions you have with your health care provider. Document Revised: 06/30/2018 Document Reviewed: 06/30/2018 Elsevier Patient Education  2020 [employer].
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
## 2020-07-07 ENCOUNTER — Other Ambulatory Visit: Payer: Self-pay | Admitting: Family Medicine

## 2020-07-08 NOTE — Telephone Encounter (Signed)
Name of Medication: Alprazolam Name of Pharmacy: [facility] Last Fill or Written Date and Quantity: 05/28/20, #40 Last Office Visit and Type: 05/16/20, AWV prt 2 Next Office Visit and Type: 05/20/21, AWV prt 2 Last Controlled Substance Agreement Date: 04/16/17 Last UDS: 04/16/17

## 2020-07-10 DIAGNOSIS — D1801 Hemangioma of skin and subcutaneous tissue: Secondary | ICD-10-CM | POA: Diagnosis not present

## 2020-07-10 DIAGNOSIS — L814 Other melanin hyperpigmentation: Secondary | ICD-10-CM | POA: Diagnosis not present

## 2020-07-10 DIAGNOSIS — L821 Other seborrheic keratosis: Secondary | ICD-10-CM | POA: Diagnosis not present

## 2020-07-10 DIAGNOSIS — D2271 Melanocytic nevi of right lower limb, including hip: Secondary | ICD-10-CM | POA: Diagnosis not present

## 2020-07-10 DIAGNOSIS — L570 Actinic keratosis: Secondary | ICD-10-CM | POA: Diagnosis not present

## 2020-07-10 DIAGNOSIS — D225 Melanocytic nevi of trunk: Secondary | ICD-10-CM | POA: Diagnosis not present

## 2020-07-10 DIAGNOSIS — Z85828 Personal history of other malignant neoplasm of skin: Secondary | ICD-10-CM | POA: Diagnosis not present

## 2020-07-10 NOTE — Telephone Encounter (Signed)
ERx 

## 2020-07-25 ENCOUNTER — Other Ambulatory Visit: Payer: Self-pay | Admitting: Obstetrics and Gynecology

## 2020-07-25 DIAGNOSIS — Z1231 Encounter for screening mammogram for malignant neoplasm of breast: Secondary | ICD-10-CM

## 2020-07-31 ENCOUNTER — Ambulatory Visit (INDEPENDENT_AMBULATORY_CARE_PROVIDER_SITE_OTHER): Payer: PPO

## 2020-07-31 DIAGNOSIS — Z23 Encounter for immunization: Secondary | ICD-10-CM

## 2020-09-02 DIAGNOSIS — H02051 Trichiasis without entropian right upper eyelid: Secondary | ICD-10-CM | POA: Diagnosis not present

## 2020-09-03 ENCOUNTER — Other Ambulatory Visit: Payer: Self-pay | Admitting: Family Medicine

## 2020-09-03 NOTE — Telephone Encounter (Signed)
Name of Medication: Alprazolam Name of Pharmacy: [place] or Written Date and Quantity: 07/10/20, #40 Last Office Visit and Type: 05/16/20, AWV prt 2 Next Office Visit and Type: 05/20/21, AWV prt 2 Last Controlled Substance Agreement Date: 04/16/17 Last UDS: 04/16/17   Bentyl last filled:  08/25/19, #90 Ondansetron last filled:  12/28/19, #90

## 2020-09-04 NOTE — Telephone Encounter (Signed)
ERx 

## 2020-09-05 ENCOUNTER — Telehealth: Payer: Self-pay

## 2020-09-05 NOTE — Telephone Encounter (Signed)
Please call patient's cell number.

## 2020-09-05 NOTE — Telephone Encounter (Signed)
Left message for pt to return call to triage RN. 

## 2020-09-05 NOTE — Telephone Encounter (Signed)
Patient is calling in regards to hemroids and "not feeling right down there".

## 2020-09-05 NOTE — Telephone Encounter (Signed)
AEX 6/21 with BS  Spoke with pt. Pt states having hemorrhoid problem x 1 month. Pt states looked with mirror today and states " feels like hemorrhoids are larger" than last time and feeling stinging sensation when bathing. Denies any BM changes or problems going  Pt denies any sores, blisters on or near vagina or rectum or rectal bleeding. Pt states has GI and PCP, but does not want to see them for this problem. Pt denies vaginal discharge, odor, UTI sx.   Pt advised to have OV for evaluation. Pt agreeable. Pt scheduled with Claiborne Billings, NP with first available appt on 11/30 at 21 am. Pt agreeable to date and time of appt. Pt advised if sx worsen or see rectal bleeding, then be seen by PCP or ER before appt. Pt verbalized understanding.  Routing to [place], NP for review Encounter closed

## 2020-09-09 ENCOUNTER — Ambulatory Visit (INDEPENDENT_AMBULATORY_CARE_PROVIDER_SITE_OTHER): Payer: PPO | Admitting: Family Medicine

## 2020-09-09 ENCOUNTER — Encounter: Payer: Self-pay | Admitting: Family Medicine

## 2020-09-09 ENCOUNTER — Ambulatory Visit
Admission: RE | Admit: 2020-09-09 | Discharge: 2020-09-09 | Disposition: A | Discharge status: Home / Self Care | Payer: PPO | Source: Ambulatory Visit | Attending: Obstetrics and Gynecology | Admitting: Obstetrics and Gynecology

## 2020-09-09 ENCOUNTER — Other Ambulatory Visit: Payer: Self-pay

## 2020-09-09 VITALS — BP 120/60 | HR 67 | Temp 97.6°F | Ht 64.0 in | Wt 150.2 lb

## 2020-09-09 DIAGNOSIS — N390 Urinary tract infection, site not specified: Secondary | ICD-10-CM | POA: Diagnosis not present

## 2020-09-09 DIAGNOSIS — R519 Headache, unspecified: Secondary | ICD-10-CM | POA: Diagnosis not present

## 2020-09-09 DIAGNOSIS — Z1231 Encounter for screening mammogram for malignant neoplasm of breast: Secondary | ICD-10-CM | POA: Diagnosis not present

## 2020-09-09 DIAGNOSIS — R109 Unspecified abdominal pain: Secondary | ICD-10-CM

## 2020-09-09 DIAGNOSIS — M810 Age-related osteoporosis without current pathological fracture: Secondary | ICD-10-CM

## 2020-09-09 MED ORDER — METHOCARBAMOL 500 MG PO TABS: 500.0000 mg | ORAL_TABLET | Freq: Three times a day (TID) | ORAL | 1 refills | Status: DC | PRN

## 2020-09-09 NOTE — Patient Instructions (Addendum)
I think headaches are coming from neck.  Ensure good ergonomics at home (TV in living room).  Use muscle relaxant sent to pharmacy, may use gabapentin, may use heating pad. Gentle stretching for the neck.  I'm not sure where left sided pain is coming from. I agree with holding keflex to see if this improves. Continue acvitia.

## 2020-09-09 NOTE — Progress Notes (Signed)
Patient ID: Bianca Fry, female    DOB: 02/21/1946, 74 y.o.   MRN: [id number]  This visit was conducted in person.  BP 120/60 (BP Location: Left Arm, Patient Position: Sitting, Cuff Size: Normal)   Pulse 67   Temp 97.6 F (36.4 C) (Temporal)   Ht 5\' 4"  (1.626 m)   Wt 150 lb 3 oz (68.1 kg)   LMP 10/20/1979 (Within Years)   SpO2 97%   BMI 25.78 kg/m    CC: abd LUQ pain, headache  Subjective:   HPI: Bianca Fry is a 74 y.o. female presenting on 09/09/2020 for Chest Pain (C/o left rib pain, off and on.  Today area is fine.  Started about 1 yr ago.  Was seen for previously. ) and Headache (C/o HA off and on for about 2 yrs. )   Chronic L abdominal pain under L ribcage for years. Comes and goes, lasts <1 wk, dull achey pain. Acutely worse 5 days ago. Worse with prolonged standing (ie cooking) - then sitting down helps. Tylenol can help. Also intermittent nausea and loose stools/diarrhea. No vomiting. No blood in stool or urine.   She changed keflex to night time with some improvement.   Also notes increasing pain starting from L>R neck into posterior head, acutely worse recently but present for years. May be coming from TV position in the living room - twists neck to the right when she's watching TV, triggers headache. Tried gabapentin with benefit.   Fosamax caused R hip pain.   Last saw cardiology 06/2020 - thought PVCs causing palpitations.  Recurrent UTI followed by Dr Amalia Hailey urology on daily keflex with cranberry pills and D-mannose supplements. Continues estrogen for vaginal dryness.      Relevant past medical, surgical, family and social history reviewed and updated as indicated. Interim medical history since our last visit reviewed. Allergies and medications reviewed and updated. Outpatient Medications Prior to Visit  Medication Sig Dispense Refill  . ALPRAZolam (XANAX) 0.5 MG tablet TAKE 1 TABLET BY MOUTH AT BEDTIME AS NEEDED FOR ANXIETY 40 tablet 0  . aspirin EC 81 MG  EC tablet Take 1 tablet (81 mg total) by mouth daily. 30 tablet 2  . atenolol (TENORMIN) 25 MG tablet Take 1 tablet by mouth once daily 90 tablet 3  . atorvastatin (LIPITOR) 10 MG tablet Take 1 tablet (10 mg total) by mouth daily. 90 tablet 3  . Calcium Carb-Cholecalciferol (CALCIUM-VITAMIN D) 600-400 MG-UNIT TABS Take 1 tablet by mouth daily.    . cephALEXin (KEFLEX) 250 MG capsule Take 250 mg by mouth daily.  99  . Cholecalciferol (VITAMIN D3 PO) Take by mouth daily.     . clindamycin (CLEOCIN) 150 MG capsule TAKE FOUR CAPSULES BY MOUTH ONE HOUR BEFORE APPOINTMENT    . dicyclomine (BENTYL) 10 MG capsule TAKE 1 CAPSULE BY MOUTH TWICE DAILY AS NEEDED FOR SPASMS 90 capsule 0  . estradiol (ESTRACE) 0.1 MG/GM vaginal cream USE ONE-HALF GRAM VAGINALLY THREE TIMES A WEEK 43 g 0  . fexofenadine (ALLEGRA) 180 MG tablet Take 180 mg by mouth daily as needed for allergies.     Marland Kitchen gabapentin (NEURONTIN) 100 MG capsule Take 1 capsule (100 mg total) by mouth 3 (three) times daily. 60 capsule 0  . losartan (COZAAR) 50 MG tablet Take 1 tablet (50 mg total) by mouth daily. 90 tablet 3  . meloxicam (MOBIC) 7.5 MG tablet Take 7.5 mg by mouth daily.    . mupirocin ointment (BACTROBAN) 2 %  Apply 1 application topically 2 (two) times daily. (Patient taking differently: Apply 1 application topically 2 (two) times daily as needed. ) 22 g 0  . omeprazole (PRILOSEC) 40 MG capsule Take 1 capsule by mouth once daily 90 capsule 3  . ondansetron (ZOFRAN) 8 MG tablet TAKE 1 TABLET BY MOUTH EVERY 8 HOURS AS NEEDED FOR NAUSEA AND VOMITING 90 tablet 0  . triamcinolone cream (KENALOG) 0.1 % APPLY  CREAM TOPICALLY TWICE DAILY. LIMIT TO 2 WEEKS DURATION 30 g 0  . valACYclovir (VALTREX) 1000 MG tablet TAKE 2 TABLETS BY MOUTH IN THE MORNING AND 2 IN THE EVENING FOR 1 DAY ONLY AS NEEDEED FOR FLARE     No facility-administered medications prior to visit.     Per HPI unless specifically indicated in ROS section below Review of  Systems Objective:  BP 120/60 (BP Location: Left Arm, Patient Position: Sitting, Cuff Size: Normal)   Pulse 67   Temp 97.6 F (36.4 C) (Temporal)   Ht 5\' 4"  (1.626 m)   Wt 150 lb 3 oz (68.1 kg)   LMP 10/20/1979 (Within Years)   SpO2 97%   BMI 25.78 kg/m   Wt Readings from Last 3 Encounters:  09/09/20 150 lb 3 oz (68.1 kg)  06/25/20 147 lb (66.7 kg)  05/16/20 150 lb (68 kg)      Physical Exam Vitals and nursing note reviewed.  Constitutional:      Appearance: Normal appearance. She is not ill-appearing.  Eyes:     Extraocular Movements: Extraocular movements intact.     Pupils: Pupils are equal, round, and reactive to light.  Neck:     Comments:  Limited ROM to R lateral rotation and flexion due to pain Midline discomfort to lower cervical neck.   Cardiovascular:     Rate and Rhythm: Normal rate and regular rhythm.     Pulses: Normal pulses.     Heart sounds: Normal heart sounds. No murmur heard.   Pulmonary:     Effort: Pulmonary effort is normal. No respiratory distress.     Breath sounds: Normal breath sounds. No wheezing, rhonchi or rales.  Abdominal:     General: Bowel sounds are normal. There is no distension.     Palpations: Abdomen is soft. There is no mass.     Tenderness: There is no abdominal tenderness. There is no right CVA tenderness, left CVA tenderness, guarding or rebound. Negative signs include Murphy's sign.     Hernia: No hernia is present.     Comments: No significant reproducible tenderness to palpation  Musculoskeletal:     Cervical back: Normal range of motion. No rigidity.     Right lower leg: No edema.     Left lower leg: No edema.  Lymphadenopathy:     Cervical: No cervical adenopathy.  Skin:    General: Skin is warm and dry.     Findings: No rash.  Neurological:     Mental Status: She is alert.  Psychiatric:        Mood and Affect: Mood normal.        Behavior: Behavior normal.       Results for orders placed or performed in  visit on 05/16/20  Comprehensive metabolic panel  Result Value Ref Range   Sodium 140 135 - 145 mEq/L   Potassium 4.6 3.5 - 5.1 mEq/L   Chloride 105 96 - 112 mEq/L   CO2 29 19 - 32 mEq/L   Glucose, Bld 93 70 - 99  mg/dL   BUN 17 6 - 23 mg/dL   Creatinine, Ser 0.95 0.40 - 1.20 mg/dL   Total Bilirubin 0.5 0.2 - 1.2 mg/dL   Alkaline Phosphatase 79 39 - 117 U/L   AST 16 0 - 37 U/L   ALT 11 0 - 35 U/L   Total Protein 6.7 6.0 - 8.3 g/dL   Albumin 4.0 3.5 - 5.2 g/dL   GFR 57.28 (L) >60.00 mL/min   Calcium 9.4 8.4 - 10.5 mg/dL  Lipid panel  Result Value Ref Range   Cholesterol 157 0 - 200 mg/dL   Triglycerides 147.0 0 - 149 mg/dL   HDL 57.80 >39.00 mg/dL   VLDL 29.4 0.0 - 40.0 mg/dL   LDL Cholesterol 70 0 - 99 mg/dL   Total CHOL/HDL Ratio 3    NonHDL 98.99   Hemoglobin A1c  Result Value Ref Range   Hgb A1c MFr Bld 6.6 (H) 4.6 - 6.5 %  CBC with Differential/Platelet  Result Value Ref Range   WBC 6.9 4.0 - 10.5 K/uL   RBC 4.32 3.87 - 5.11 Mil/uL   Hemoglobin 11.7 (L) 12.0 - 15.0 g/dL   HCT 36.1 36 - 46 %   MCV 83.5 78.0 - 100.0 fl   MCHC 32.4 30.0 - 36.0 g/dL   RDW 14.9 11.5 - 15.5 %   Platelets 145.0 (L) 150 - 400 K/uL   Neutrophils Relative % 51.2 43 - 77 %   Lymphocytes Relative 36.0 12 - 46 %   Monocytes Relative 9.5 3 - 12 %   Eosinophils Relative 3.1 0 - 5 %   Basophils Relative 0.2 0 - 3 %   Neutro Abs 3.5 1.4 - 7.7 K/uL   Lymphs Abs 2.5 0.7 - 4.0 K/uL   Monocytes Absolute 0.7 0.1 - 1.0 K/uL   Eosinophils Absolute 0.2 0.0 - 0.7 K/uL   Basophils Absolute 0.0 0.0 - 0.1 K/uL  VITAMIN D 25 Hydroxy (Vit-D Deficiency, Fractures)  Result Value Ref Range   VITD 40.11 30.00 - 100.00 ng/mL   Assessment & Plan:  This visit occurred during the SARS-CoV-2 public health emergency.  Safety protocols were in place, including screening questions prior to the visit, additional usage of staff PPE, and extensive cleaning of exam room while observing appropriate contact time as  indicated for disinfecting solutions.   Problem List Items Addressed This Visit    Recurrent UTI    Sees Dr Amalia Hailey - considering cranberry pills, D-mannose, continues keflex. See below for keflex plan.       Osteoporosis    Fosamax caused R hip pain - now off this.       Left lateral abdominal pain    Ongoing for years. Has had GI eval previously.  ?MSK cause.  Possibly exacerbated by daily keflex used for UTI ppx. Suggested hold keflex and monitor for improvement      Headache - Primary    Suspect cervicogenic headache from poor position of TV in living room.  rec robaxin muscle relaxant, gentle stretching, heating pad, update with effect. Ok to continue gabapentin. Work on Personal assistant in living room.       Relevant Medications   methocarbamol (ROBAXIN) 500 MG tablet       Meds ordered this encounter  Medications  . methocarbamol (ROBAXIN) 500 MG tablet    Sig: Take 1 tablet (500 mg total) by mouth 3 (three) times daily as needed for muscle spasms.    Dispense:  30 tablet  Refill:  1   No orders of the defined types were placed in this encounter.   Patient Instructions  I think headaches are coming from neck.  Ensure good ergonomics at home (TV in living room).  Use muscle relaxant sent to pharmacy, may use gabapentin, may use heating pad. Gentle stretching for the neck.  I'm not sure where left sided pain is coming from. I agree with holding keflex to see if this improves. Continue acvitia.    Follow up plan: Return if symptoms worsen or fail to improve.  Ria Bush, MD

## 2020-09-10 ENCOUNTER — Telehealth: Payer: Self-pay

## 2020-09-10 DIAGNOSIS — G4486 Cervicogenic headache: Secondary | ICD-10-CM | POA: Insufficient documentation

## 2020-09-10 DIAGNOSIS — R519 Headache, unspecified: Secondary | ICD-10-CM | POA: Insufficient documentation

## 2020-09-10 NOTE — Telephone Encounter (Signed)
Patient cancelled problem visit because she is feeling better.

## 2020-09-10 NOTE — Assessment & Plan Note (Signed)
Ongoing for years. Has had GI eval previously.  ?MSK cause.  Possibly exacerbated by daily keflex used for UTI ppx. Suggested hold keflex and monitor for improvement

## 2020-09-10 NOTE — Assessment & Plan Note (Signed)
Sees Dr Amalia Hailey - considering cranberry pills, D-mannose, continues keflex. See below for keflex plan.

## 2020-09-10 NOTE — Assessment & Plan Note (Signed)
Fosamax caused R hip pain - now off this.

## 2020-09-10 NOTE — Telephone Encounter (Signed)
Left detailed message for pt to return call if needs to be seen in future. Pt was seen by PCP yesterday (09/09/20) Encounter closed

## 2020-09-10 NOTE — Assessment & Plan Note (Addendum)
Suspect cervicogenic headache from poor position of TV in living room.  rec robaxin muscle relaxant, gentle stretching, heating pad, update with effect. Ok to continue gabapentin. Work on Personal assistant in living room.

## 2020-09-17 ENCOUNTER — Ambulatory Visit: Payer: Self-pay | Admitting: Nurse Practitioner

## 2020-10-15 ENCOUNTER — Other Ambulatory Visit: Payer: Self-pay | Admitting: Obstetrics and Gynecology

## 2020-10-15 ENCOUNTER — Other Ambulatory Visit: Payer: Self-pay | Admitting: Family Medicine

## 2020-10-15 DIAGNOSIS — N952 Postmenopausal atrophic vaginitis: Secondary | ICD-10-CM

## 2020-10-15 DIAGNOSIS — Z01419 Encounter for gynecological examination (general) (routine) without abnormal findings: Secondary | ICD-10-CM

## 2020-10-15 NOTE — Telephone Encounter (Signed)
Pharmacy requests refill on: Alprazolam 0.5 mg  LAST REFILL: 09/04/2020 (Q-40, R-0) LAST OV: 09/09/2020 NEXT OV: 05/20/2021 PHARMACY: [id number] [place], [place]

## 2020-10-15 NOTE — Telephone Encounter (Signed)
Medication refill request: Estradiol Last AEX:  04/17/20 Dr. Edward Jolly Next AEX: 04/24/21 Last MMG (if hormonal medication request): 09/12/20 BIRADS 1 negative/density b Refill authorized: today, please advise

## 2020-10-18 NOTE — Telephone Encounter (Signed)
ERx 

## 2020-11-01 ENCOUNTER — Ambulatory Visit (INDEPENDENT_AMBULATORY_CARE_PROVIDER_SITE_OTHER)
Admission: RE | Admit: 2020-11-01 | Discharge: 2020-11-01 | Disposition: A | Discharge status: Home / Self Care | Payer: PPO | Source: Ambulatory Visit | Attending: Family Medicine | Admitting: Family Medicine

## 2020-11-01 ENCOUNTER — Ambulatory Visit (INDEPENDENT_AMBULATORY_CARE_PROVIDER_SITE_OTHER): Payer: PPO | Admitting: Family Medicine

## 2020-11-01 ENCOUNTER — Other Ambulatory Visit: Payer: Self-pay

## 2020-11-01 ENCOUNTER — Encounter: Payer: Self-pay | Admitting: Family Medicine

## 2020-11-01 VITALS — BP 136/66 | HR 65 | Temp 97.8°F | Ht 64.0 in | Wt 151.5 lb

## 2020-11-01 DIAGNOSIS — R1012 Left upper quadrant pain: Secondary | ICD-10-CM | POA: Diagnosis not present

## 2020-11-01 DIAGNOSIS — M4319 Spondylolisthesis, multiple sites in spine: Secondary | ICD-10-CM | POA: Diagnosis not present

## 2020-11-01 DIAGNOSIS — M47812 Spondylosis without myelopathy or radiculopathy, cervical region: Secondary | ICD-10-CM | POA: Diagnosis not present

## 2020-11-01 DIAGNOSIS — G8929 Other chronic pain: Secondary | ICD-10-CM

## 2020-11-01 DIAGNOSIS — R519 Headache, unspecified: Secondary | ICD-10-CM

## 2020-11-01 DIAGNOSIS — M2578 Osteophyte, vertebrae: Secondary | ICD-10-CM | POA: Diagnosis not present

## 2020-11-01 NOTE — Patient Instructions (Addendum)
For neck - xrays today. Start mobic 7.5mg  daily for the next 1-2 weeks, take with food. Stretching exercises provided today. Restart gabapentin 100mg  three times daily as needed, we have room to increase dose if needed For left side pain - try bentyl daily with largest meal for a few weeks. Switch probiotic to align daily for a few weeks. If no better with this, let me know for CT.

## 2020-11-01 NOTE — Assessment & Plan Note (Signed)
Chronic s/p unrevealing GI eval.  In h/o IBS, discussed probiotic use - suggested transition from [place] to align, as well as continued fiber intake to avoid constipation and daily bentyl.  Update if ongoing to consider rpt imaging (likely CT abd).

## 2020-11-01 NOTE — Assessment & Plan Note (Signed)
Anticipate ongoing cervicogenic headache acutely worse after lifting cut wood over weekend. Discussed treatment options at home - rec mobic 7.5mg  daily for 1-[redacted] wks along with PRN tylenol, robaxin, restart gabapentin 100mg  TID, discussed topical voltaren gel use. Update if persistent. Update neck films today.

## 2020-11-01 NOTE — Progress Notes (Signed)
Patient ID: Bianca Fry, female    DOB: 11-14-1945, 75 y.o.   MRN: [id number]  This visit was conducted in person.  BP 136/66 (BP Location: Left Arm, Patient Position: Sitting, Cuff Size: Normal)   Pulse 65   Temp 97.8 F (36.6 C) (Temporal)   Ht 5\' 4"  (1.626 m)   Wt 151 lb 8 oz (68.7 kg)   LMP 10/20/1979 (Within Years)   SpO2 96%   BMI 26.00 kg/m    CC: Abd pain, headache  Subjective:   HPI: Bianca Fry is a 75 y.o. female presenting on 11/01/2020 for Abdominal Pain (C/o continued RUQ abd pain.) and Headache (C/o HA.  Started 10/26/20.)   From previous note:  Chronic L abdominal pain under L ribcage for years. Comes and goes, lasts <1 wk, dull achey pain. Worse with prolonged standing (ie cooking) - then sitting down helps. Tylenol can help. Also intermittent nausea and loose stools/diarrhea. No vomiting. No blood in stool or urine.  Also notes increasing pain starting from L>R neck into posterior head, acutely worse recently but present for years. May be coming from TV position in the living room - twists neck to the right when she's watching TV, triggers headache. Tried gabapentin with benefit.   Left lateral abdominal pain didn't improve with holding keflex. S/p unrevealing GI evaluation - focal colitis on colonoscopy 2016, ?segmental colitis associated diverticulosis syndrome. S/p thoracic films 12/2019. Becoming more frequent. Takes keflex for chronic cystitis. Changing this to night time has helped with nausea. No diarrhea/constipation associated with abd pain. Pain may be worse when constipated. H/o IBS - eating a box of raisins nightly for fiber. She eats an [place] daily.   Headache thought cervicogenic - treating with heating pad, robaxin, tylenol, gentle stretching, working on ergonomics. Acutely worse over the past week, L>R neck pain. Last night took ibuprofen with the most benefit. Has not tried gabapentin yet. This may have started after cutting up and lifting wood after  a tree was cut down. No numbness, weakness, or shooting pain down arms.      Relevant past medical, surgical, family and social history reviewed and updated as indicated. Interim medical history since our last visit reviewed. Allergies and medications reviewed and updated. Outpatient Medications Prior to Visit  Medication Sig Dispense Refill  . ALPRAZolam (XANAX) 0.5 MG tablet TAKE 1 TABLET BY MOUTH AT BEDTIME AS NEEDED FOR ANXIETY 40 tablet 0  . aspirin EC 81 MG EC tablet Take 1 tablet (81 mg total) by mouth daily. 30 tablet 2  . atenolol (TENORMIN) 25 MG tablet Take 1 tablet by mouth once daily 90 tablet 3  . atorvastatin (LIPITOR) 10 MG tablet Take 1 tablet (10 mg total) by mouth daily. 90 tablet 3  . Calcium Carb-Cholecalciferol (CALCIUM-VITAMIN D) 600-400 MG-UNIT TABS Take 1 tablet by mouth daily.    . cephALEXin (KEFLEX) 250 MG capsule Take 250 mg by mouth daily.  99  . Cholecalciferol (VITAMIN D3 PO) Take by mouth daily.     . clindamycin (CLEOCIN) 150 MG capsule TAKE FOUR CAPSULES BY MOUTH ONE HOUR BEFORE APPOINTMENT    . D-MANNOSE PO Take by mouth. With cranberry.  OTC    . dicyclomine (BENTYL) 10 MG capsule TAKE 1 CAPSULE BY MOUTH TWICE DAILY AS NEEDED FOR SPASMS 90 capsule 0  . estradiol (ESTRACE) 0.1 MG/GM vaginal cream USE ONE-HALF GRAM VAGINALLY THREE TIMES WEEKLY 43 g 0  . fexofenadine (ALLEGRA) 180 MG tablet Take 180 mg  by mouth daily as needed for allergies.    Marland Kitchen gabapentin (NEURONTIN) 100 MG capsule Take 1 capsule (100 mg total) by mouth 3 (three) times daily. 60 capsule 0  . losartan (COZAAR) 50 MG tablet Take 1 tablet (50 mg total) by mouth daily. 90 tablet 3  . meloxicam (MOBIC) 7.5 MG tablet Take 7.5 mg by mouth daily.    . methocarbamol (ROBAXIN) 500 MG tablet Take 1 tablet (500 mg total) by mouth 3 (three) times daily as needed for muscle spasms. 30 tablet 1  . mupirocin ointment (BACTROBAN) 2 % Apply 1 application topically 2 (two) times daily. (Patient taking  differently: Apply 1 application topically 2 (two) times daily as needed.) 22 g 0  . omeprazole (PRILOSEC) 40 MG capsule Take 1 capsule by mouth once daily 90 capsule 3  . ondansetron (ZOFRAN) 8 MG tablet TAKE 1 TABLET BY MOUTH EVERY 8 HOURS AS NEEDED FOR NAUSEA AND VOMITING 90 tablet 0  . triamcinolone cream (KENALOG) 0.1 % APPLY  CREAM TOPICALLY TWICE DAILY. LIMIT TO 2 WEEKS DURATION 30 g 0  . valACYclovir (VALTREX) 1000 MG tablet TAKE 2 TABLETS BY MOUTH IN THE MORNING AND 2 IN THE EVENING FOR 1 DAY ONLY AS NEEDEED FOR FLARE     No facility-administered medications prior to visit.     Per HPI unless specifically indicated in ROS section below Review of Systems Objective:  BP 136/66 (BP Location: Left Arm, Patient Position: Sitting, Cuff Size: Normal)   Pulse 65   Temp 97.8 F (36.6 C) (Temporal)   Ht 5\' 4"  (1.626 m)   Wt 151 lb 8 oz (68.7 kg)   LMP 10/20/1979 (Within Years)   SpO2 96%   BMI 26.00 kg/m   Wt Readings from Last 3 Encounters:  11/01/20 151 lb 8 oz (68.7 kg)  09/09/20 150 lb 3 oz (68.1 kg)  06/25/20 147 lb (66.7 kg)      Physical Exam Vitals and nursing note reviewed.  Constitutional:      Appearance: Normal appearance. She is not ill-appearing.  Neck:     Comments:  No midline cervical tenderness Discomfort paracervical mm L>R FROM at neck, discomfort with lateral flexion and rotation to left Cardiovascular:     Rate and Rhythm: Normal rate and regular rhythm.  Abdominal:     General: Abdomen is flat. Bowel sounds are normal. There is no distension.     Palpations: Abdomen is soft. There is no mass.     Tenderness: There is abdominal tenderness (mild-mod) in the left upper quadrant. There is no right CVA tenderness, left CVA tenderness, guarding or rebound. Negative signs include Murphy's sign.     Hernia: No hernia is present.  Musculoskeletal:     Cervical back: Normal range of motion. Tenderness present. No rigidity.  Skin:    General: Skin is warm and  dry.     Findings: No rash.  Neurological:     Mental Status: She is alert.  Psychiatric:        Mood and Affect: Mood normal.        Behavior: Behavior normal.       Results for orders placed or performed in visit on 05/16/20  Comprehensive metabolic panel  Result Value Ref Range   Sodium 140 135 - 145 mEq/L   Potassium 4.6 3.5 - 5.1 mEq/L   Chloride 105 96 - 112 mEq/L   CO2 29 19 - 32 mEq/L   Glucose, Bld 93 70 - 99 mg/dL  BUN 17 6 - 23 mg/dL   Creatinine, Ser 0.95 0.40 - 1.20 mg/dL   Total Bilirubin 0.5 0.2 - 1.2 mg/dL   Alkaline Phosphatase 79 39 - 117 U/L   AST 16 0 - 37 U/L   ALT 11 0 - 35 U/L   Total Protein 6.7 6.0 - 8.3 g/dL   Albumin 4.0 3.5 - 5.2 g/dL   GFR 57.28 (L) >60.00 mL/min   Calcium 9.4 8.4 - 10.5 mg/dL  Lipid panel  Result Value Ref Range   Cholesterol 157 0 - 200 mg/dL   Triglycerides 147.0 0.0 - 149.0 mg/dL   HDL 57.80 >39.00 mg/dL   VLDL 29.4 0.0 - 40.0 mg/dL   LDL Cholesterol 70 0 - 99 mg/dL   Total CHOL/HDL Ratio 3    NonHDL 98.99   Hemoglobin A1c  Result Value Ref Range   Hgb A1c MFr Bld 6.6 (H) 4.6 - 6.5 %  CBC with Differential/Platelet  Result Value Ref Range   WBC 6.9 4.0 - 10.5 K/uL   RBC 4.32 3.87 - 5.11 Mil/uL   Hemoglobin 11.7 (L) 12.0 - 15.0 g/dL   HCT 36.1 36.0 - 46.0 %   MCV 83.5 78.0 - 100.0 fl   MCHC 32.4 30.0 - 36.0 g/dL   RDW 14.9 11.5 - 15.5 %   Platelets 145.0 (L) 150.0 - 400.0 K/uL   Neutrophils Relative % 51.2 43.0 - 77.0 %   Lymphocytes Relative 36.0 12.0 - 46.0 %   Monocytes Relative 9.5 3.0 - 12.0 %   Eosinophils Relative 3.1 0.0 - 5.0 %   Basophils Relative 0.2 0.0 - 3.0 %   Neutro Abs 3.5 1.4 - 7.7 K/uL   Lymphs Abs 2.5 0.7 - 4.0 K/uL   Monocytes Absolute 0.7 0.1 - 1.0 K/uL   Eosinophils Absolute 0.2 0.0 - 0.7 K/uL   Basophils Absolute 0.0 0.0 - 0.1 K/uL  VITAMIN D 25 Hydroxy (Vit-D Deficiency, Fractures)  Result Value Ref Range   VITD 40.11 30.00 - 100.00 ng/mL   Assessment & Plan:  This visit  occurred during the SARS-CoV-2 public health emergency.  Safety protocols were in place, including screening questions prior to the visit, additional usage of staff PPE, and extensive cleaning of exam room while observing appropriate contact time as indicated for disinfecting solutions.   Problem List Items Addressed This Visit    Headache    Anticipate ongoing cervicogenic headache acutely worse after lifting cut wood over weekend. Discussed treatment options at home - rec mobic 7.5mg  daily for 1-[redacted] wks along with PRN tylenol, robaxin, restart gabapentin 100mg  TID, discussed topical voltaren gel use. Update if persistent. Update neck films today.       Relevant Orders   DG Cervical Spine Complete   Abdominal pain, chronic, left upper quadrant - Primary    Chronic s/p unrevealing GI eval.  In h/o IBS, discussed probiotic use - suggested transition from [place] to align, as well as continued fiber intake to avoid constipation and daily bentyl.  Update if ongoing to consider rpt imaging (likely CT abd).           No orders of the defined types were placed in this encounter.  Orders Placed This Encounter  Procedures  . DG Cervical Spine Complete    Standing Status:   Future    Number of Occurrences:   1    Standing Expiration Date:   11/01/2021    Order Specific Question:   Reason for Exam (SYMPTOM  OR DIAGNOSIS REQUIRED)    Answer:   L>R neck pain with cervicogenic HA    Order Specific Question:   Preferred imaging location?    Answer:   [facility]    Patient Instructions  For neck - xrays today. Start mobic 7.5mg  daily for the next 1-2 weeks, take with food. Stretching exercises provided today. Restart gabapentin 100mg  three times daily as needed, we have room to increase dose if needed For left side pain - try bentyl daily with largest meal for a few weeks. Switch probiotic to align daily for a few weeks. If no better with this, let me know for CT.  Follow up plan: Return  if symptoms worsen or fail to improve.  Ria Bush, MD

## 2020-11-11 ENCOUNTER — Telehealth: Payer: Self-pay

## 2020-11-11 DIAGNOSIS — G8929 Other chronic pain: Secondary | ICD-10-CM

## 2020-11-11 DIAGNOSIS — R1012 Left upper quadrant pain: Secondary | ICD-10-CM

## 2020-11-11 NOTE — Telephone Encounter (Signed)
Pt called to say she is still having the LUQ pain. She is ready to take the next step to see what is causing it. Please set up whatever that may be: imaging, specialist, etc. Please let her know at [phone].

## 2020-11-11 NOTE — Telephone Encounter (Signed)
Called pt to advise, left VM to return call to office. -JMA

## 2020-11-11 NOTE — Telephone Encounter (Signed)
CT abdomen with contrast ordered.  She will need to come in for labs prior to CT scan.

## 2020-11-12 NOTE — Telephone Encounter (Signed)
Pt has lab visit scheduled on 11/14/20 at 9:00.

## 2020-11-14 ENCOUNTER — Other Ambulatory Visit: Payer: Self-pay

## 2020-11-14 ENCOUNTER — Other Ambulatory Visit (INDEPENDENT_AMBULATORY_CARE_PROVIDER_SITE_OTHER): Payer: PPO

## 2020-11-14 ENCOUNTER — Other Ambulatory Visit: Payer: Self-pay | Admitting: Family Medicine

## 2020-11-14 DIAGNOSIS — R1012 Left upper quadrant pain: Secondary | ICD-10-CM

## 2020-11-14 DIAGNOSIS — G8929 Other chronic pain: Secondary | ICD-10-CM

## 2020-11-14 LAB — CBC WITH DIFFERENTIAL/PLATELET
Basophils Absolute: 0 10*3/uL (ref 0.0–0.1)
Basophils Relative: 0.3 % (ref 0.0–3.0)
Eosinophils Absolute: 0.1 10*3/uL (ref 0.0–0.7)
Eosinophils Relative: 1.9 % (ref 0.0–5.0)
HCT: 36.2 % (ref 36.0–46.0)
Hemoglobin: 11.9 g/dL — ABNORMAL LOW (ref 12.0–15.0)
Lymphocytes Relative: 27.1 % (ref 12.0–46.0)
Lymphs Abs: 2 10*3/uL (ref 0.7–4.0)
MCHC: 32.8 g/dL (ref 30.0–36.0)
MCV: 84.4 fl (ref 78.0–100.0)
Monocytes Absolute: 0.7 10*3/uL (ref 0.1–1.0)
Monocytes Relative: 9 % (ref 3.0–12.0)
Neutro Abs: 4.5 10*3/uL (ref 1.4–7.7)
Neutrophils Relative %: 61.7 % (ref 43.0–77.0)
Platelets: 139 10*3/uL — ABNORMAL LOW (ref 150.0–400.0)
RBC: 4.28 Mil/uL (ref 3.87–5.11)
RDW: 14.3 % (ref 11.5–15.5)
WBC: 7.3 10*3/uL (ref 4.0–10.5)

## 2020-11-14 LAB — COMPREHENSIVE METABOLIC PANEL
ALT: 11 U/L (ref 0–35)
AST: 11 U/L (ref 0–37)
Albumin: 4 g/dL (ref 3.5–5.2)
Alkaline Phosphatase: 83 U/L (ref 39–117)
BUN: 15 mg/dL (ref 6–23)
CO2: 30 mEq/L (ref 19–32)
Calcium: 9.3 mg/dL (ref 8.4–10.5)
Chloride: 104 mEq/L (ref 96–112)
Creatinine, Ser: 0.94 mg/dL (ref 0.40–1.20)
GFR: 59.19 mL/min — ABNORMAL LOW (ref >60.00)
Glucose, Bld: 111 mg/dL — ABNORMAL HIGH (ref 70–99)
Potassium: 4.1 mEq/L (ref 3.5–5.1)
Sodium: 141 mEq/L (ref 135–145)
Total Bilirubin: 0.5 mg/dL (ref 0.2–1.2)
Total Protein: 6.6 g/dL (ref 6.0–8.3)

## 2020-11-14 LAB — TSH: TSH: 3.02 u[IU]/mL (ref 0.35–4.50)

## 2020-11-14 LAB — LIPASE: Lipase: 13 U/L (ref 11.0–59.0)

## 2020-11-14 NOTE — Telephone Encounter (Signed)
Gabapentin Last filled:  06/16/19, #60 Last OV:  11/01/20, LUQ abd pain Next OV:  05/20/21, AWV prt 2

## 2020-11-25 ENCOUNTER — Other Ambulatory Visit: Payer: Self-pay

## 2020-11-25 ENCOUNTER — Ambulatory Visit
Admission: RE | Admit: 2020-11-25 | Discharge: 2020-11-25 | Disposition: A | Discharge status: Home / Self Care | Payer: PPO | Source: Ambulatory Visit | Attending: Family Medicine | Admitting: Family Medicine

## 2020-11-25 DIAGNOSIS — I700 Atherosclerosis of aorta: Secondary | ICD-10-CM | POA: Diagnosis not present

## 2020-11-25 DIAGNOSIS — R1012 Left upper quadrant pain: Secondary | ICD-10-CM | POA: Insufficient documentation

## 2020-11-25 DIAGNOSIS — G8929 Other chronic pain: Secondary | ICD-10-CM | POA: Diagnosis not present

## 2020-11-25 DIAGNOSIS — H02051 Trichiasis without entropian right upper eyelid: Secondary | ICD-10-CM | POA: Diagnosis not present

## 2020-11-25 DIAGNOSIS — Z9889 Other specified postprocedural states: Secondary | ICD-10-CM | POA: Diagnosis not present

## 2020-11-25 MED ORDER — IOHEXOL 300 MG/ML  SOLN
100.0000 mL | Freq: Once | INTRAMUSCULAR | Status: AC | PRN
  Administered 2020-11-25: 100 mL via INTRAVENOUS

## 2020-12-10 ENCOUNTER — Other Ambulatory Visit: Payer: Self-pay | Admitting: *Deleted

## 2020-12-10 NOTE — Telephone Encounter (Signed)
Patient left a voicemail stating that she is changing her pharmacy to [facility] in [place]. Patient stated that she needs a refill on her Xanax sent to [facility]. Last refill 10/18/30 #40 Last office visit 11/01/20 Upcoming appointment 05/20/21

## 2020-12-12 MED ORDER — ALPRAZOLAM 0.5 MG PO TABS: 0.5000 mg | ORAL_TABLET | Freq: Two times a day (BID) | ORAL | 0 refills | Status: DC | PRN

## 2020-12-12 NOTE — Telephone Encounter (Signed)
ERx 

## 2020-12-16 ENCOUNTER — Other Ambulatory Visit: Payer: Self-pay

## 2020-12-16 ENCOUNTER — Ambulatory Visit (INDEPENDENT_AMBULATORY_CARE_PROVIDER_SITE_OTHER): Payer: PPO

## 2020-12-16 DIAGNOSIS — I6523 Occlusion and stenosis of bilateral carotid arteries: Secondary | ICD-10-CM | POA: Diagnosis not present

## 2021-01-08 ENCOUNTER — Other Ambulatory Visit: Payer: Self-pay

## 2021-01-08 ENCOUNTER — Ambulatory Visit (INDEPENDENT_AMBULATORY_CARE_PROVIDER_SITE_OTHER)
Admission: RE | Admit: 2021-01-08 | Discharge: 2021-01-08 | Disposition: A | Discharge status: Home / Self Care | Payer: PPO | Source: Ambulatory Visit | Attending: Family Medicine | Admitting: Family Medicine

## 2021-01-08 ENCOUNTER — Ambulatory Visit (INDEPENDENT_AMBULATORY_CARE_PROVIDER_SITE_OTHER): Payer: PPO | Admitting: Family Medicine

## 2021-01-08 ENCOUNTER — Encounter: Payer: Self-pay | Admitting: Family Medicine

## 2021-01-08 VITALS — BP 122/70 | HR 73 | Temp 97.6°F | Ht 64.0 in | Wt 150.0 lb

## 2021-01-08 DIAGNOSIS — Z981 Arthrodesis status: Secondary | ICD-10-CM | POA: Diagnosis not present

## 2021-01-08 DIAGNOSIS — G8929 Other chronic pain: Secondary | ICD-10-CM | POA: Insufficient documentation

## 2021-01-08 DIAGNOSIS — M533 Sacrococcygeal disorders, not elsewhere classified: Secondary | ICD-10-CM

## 2021-01-08 DIAGNOSIS — M8588 Other specified disorders of bone density and structure, other site: Secondary | ICD-10-CM | POA: Diagnosis not present

## 2021-01-08 NOTE — Assessment & Plan Note (Signed)
Reassuring exam today - no hemorrhoid, evidence of prolapse or fissures.  Anticipate coccydynia which may have started after fast sit onto stairs when she lost balance a few months ago. Discussed tylenol, sitting on cushion. Given ongoing pain for months, check coccyx film.

## 2021-01-08 NOTE — Patient Instructions (Addendum)
Xray today  Sounds like you have pain of coccyx May use tylenol for discomfort, up to 500mg  three times daily. Carry cushion with you to sit on - don't sit on hard surfaces.

## 2021-01-08 NOTE — Progress Notes (Signed)
Patient ID: Bianca Fry, female    DOB: [birth year], 75 y.o.   MRN: [id number]  This visit was conducted in person.  BP 122/70 (BP Location: Left Arm, Patient Position: Sitting, Cuff Size: Normal)   Pulse 73   Temp 97.6 F (36.4 C) (Temporal)   Ht 5\' 4"  (1.626 m)   Wt 150 lb (68 kg)   LMP 10/20/1979 (Within Years)   SpO2 96%   BMI 25.75 kg/m    CC: check possible hemorrhoid Subjective:   HPI: Bianca Fry is a 75 y.o. female presenting on 01/08/2021 for Acute Visit (Possible hemorrhoids )   Several months of stinging soreness progressing to bulge perianally. No significant pain. When showering feels bulge in anal area.   Actually point tender to coccyx. No inciting trauma/injury. She did have epsidoe several months ago where she sat down hard onto bottom onto stairs.   Normal BMs (1-3/day), no straining, last BM was this monrning. No bleeding with wiping. Feels she drinks plenty of water, could do better with fiber in the diet.  No fevers/chills. No dysuria. Constant stinging feeling to vaginal area.  No abdominal pain besides chronic LUQ pain of unclear etiology.   COLONOSCOPY Date: 03/2015 mod diverticulosis with focal colitis [facility] Plan) rpt 10 yrs  Discussed latest cervical films 10/2020 - severe disc space narrowing C5/6, multilevel anterior osteophytes and facet hypertrophy (worse at L C5/6). Offered return to spine clinic - she will consider.      Relevant past medical, surgical, family and social history reviewed and updated as indicated. Interim medical history since our last visit reviewed. Allergies and medications reviewed and updated. Outpatient Medications Prior to Visit  Medication Sig Dispense Refill  . ALPRAZolam (XANAX) 0.5 MG tablet Take 1 tablet (0.5 mg total) by mouth 2 (two) times daily as needed for anxiety. 40 tablet 0  . aspirin EC 81 MG EC tablet Take 1 tablet (81 mg total) by mouth daily. 30 tablet 2  . atenolol (TENORMIN) 25 MG tablet Take 1 tablet by  mouth once daily 90 tablet 3  . atorvastatin (LIPITOR) 10 MG tablet Take 1 tablet (10 mg total) by mouth daily. 90 tablet 3  . Calcium Carb-Cholecalciferol (CALCIUM-VITAMIN D) 600-400 MG-UNIT TABS Take 1 tablet by mouth daily.    . cephALEXin (KEFLEX) 250 MG capsule Take 250 mg by mouth daily.  99  . Cholecalciferol (VITAMIN D3 PO) Take by mouth daily.     . clindamycin (CLEOCIN) 150 MG capsule TAKE FOUR CAPSULES BY MOUTH ONE HOUR BEFORE APPOINTMENT    . D-MANNOSE PO Take by mouth. With cranberry.  OTC    . dicyclomine (BENTYL) 10 MG capsule TAKE 1 CAPSULE BY MOUTH TWICE DAILY AS NEEDED FOR SPASMS 90 capsule 0  . estradiol (ESTRACE) 0.1 MG/GM vaginal cream USE ONE-HALF GRAM VAGINALLY THREE TIMES WEEKLY 43 g 0  . fexofenadine (ALLEGRA) 180 MG tablet Take 180 mg by mouth daily as needed for allergies.    Marland Kitchen gabapentin (NEURONTIN) 100 MG capsule TAKE 1 CAPSULE BY MOUTH THREE TIMES DAILY 60 capsule 0  . losartan (COZAAR) 50 MG tablet Take 1 tablet (50 mg total) by mouth daily. 90 tablet 3  . meloxicam (MOBIC) 7.5 MG tablet Take 7.5 mg by mouth daily.    . methocarbamol (ROBAXIN) 500 MG tablet Take 1 tablet (500 mg total) by mouth 3 (three) times daily as needed for muscle spasms. 30 tablet 1  . mupirocin ointment (BACTROBAN) 2 % Apply 1 application  topically 2 (two) times daily. (Patient taking differently: Apply 1 application topically 2 (two) times daily as needed.) 22 g 0  . omeprazole (PRILOSEC) 40 MG capsule Take 1 capsule by mouth once daily 90 capsule 3  . ondansetron (ZOFRAN) 8 MG tablet TAKE 1 TABLET BY MOUTH EVERY 8 HOURS AS NEEDED FOR NAUSEA AND VOMITING 90 tablet 0  . triamcinolone cream (KENALOG) 0.1 % APPLY  CREAM TOPICALLY TWICE DAILY. LIMIT TO 2 WEEKS DURATION 30 g 0  . valACYclovir (VALTREX) 1000 MG tablet TAKE 2 TABLETS BY MOUTH IN THE MORNING AND 2 IN THE EVENING FOR 1 DAY ONLY AS NEEDEED FOR FLARE     No facility-administered medications prior to visit.     Per HPI unless  specifically indicated in ROS section below Review of Systems Objective:  BP 122/70 (BP Location: Left Arm, Patient Position: Sitting, Cuff Size: Normal)   Pulse 73   Temp 97.6 F (36.4 C) (Temporal)   Ht 5\' 4"  (1.626 m)   Wt 150 lb (68 kg)   LMP 10/20/1979 (Within Years)   SpO2 96%   BMI 25.75 kg/m   Wt Readings from Last 3 Encounters:  01/08/21 150 lb (68 kg)  11/01/20 151 lb 8 oz (68.7 kg)  09/09/20 150 lb 3 oz (68.1 kg)      Physical Exam Vitals and nursing note reviewed. Exam conducted with a chaperone present.  Constitutional:      Appearance: Normal appearance.  Genitourinary:    Rectum: Normal. No tenderness, anal fissure, external hemorrhoid or internal hemorrhoid.     Comments:  No mass, bulge, or anal prolapse noted Musculoskeletal:     Comments:  No pain midline spine No paraspinous mm tenderness Neg SLR bilaterally. No pain with int/ext rotation at hip. No pain at SIJ, GTB or sciatic notch bilaterally.  Tender to palpation of coccyx  Neurological:     Mental Status: She is alert.       Assessment & Plan:  This visit occurred during the SARS-CoV-2 public health emergency.  Safety protocols were in place, including screening questions prior to the visit, additional usage of staff PPE, and extensive cleaning of exam room while observing appropriate contact time as indicated for disinfecting solutions.   Problem List Items Addressed This Visit    Pain in the coccyx - Primary    Reassuring exam today - no hemorrhoid, evidence of prolapse or fissures.  Anticipate coccydynia which may have started after fast sit onto stairs when she lost balance a few months ago. Discussed tylenol, sitting on cushion. Given ongoing pain for months, check coccyx film.       Relevant Orders   DG Sacrum/Coccyx       No orders of the defined types were placed in this encounter.  Orders Placed This Encounter  Procedures  . DG Sacrum/Coccyx    Standing Status:   Future     Number of Occurrences:   1    Standing Expiration Date:   01/08/2022    Order Specific Question:   Reason for Exam (SYMPTOM  OR DIAGNOSIS REQUIRED)    Answer:   pain at coccyx x months    Order Specific Question:   Preferred imaging location?    Answer:   [facility]    Patient Instructions  Xray today  Sounds like you have pain of coccyx May use tylenol for discomfort, up to 500mg  three times daily. Carry cushion with you to sit on - don't sit on  hard surfaces.    Follow up plan: Return if symptoms worsen or fail to improve.  Ria Bush, MD

## 2021-01-17 ENCOUNTER — Other Ambulatory Visit: Payer: Self-pay

## 2021-01-17 ENCOUNTER — Observation Stay: Payer: PPO

## 2021-01-17 ENCOUNTER — Emergency Department: Payer: PPO

## 2021-01-17 ENCOUNTER — Observation Stay
Admission: EM | Admit: 2021-01-17 | Discharge: 2021-01-18 | Disposition: A | Discharge status: Home / Self Care | Payer: PPO | Attending: Internal Medicine | Admitting: Internal Medicine

## 2021-01-17 DIAGNOSIS — N390 Urinary tract infection, site not specified: Secondary | ICD-10-CM | POA: Diagnosis not present

## 2021-01-17 DIAGNOSIS — F419 Anxiety disorder, unspecified: Secondary | ICD-10-CM

## 2021-01-17 DIAGNOSIS — Q100 Congenital ptosis: Secondary | ICD-10-CM

## 2021-01-17 DIAGNOSIS — Z20822 Contact with and (suspected) exposure to covid-19: Secondary | ICD-10-CM | POA: Diagnosis not present

## 2021-01-17 DIAGNOSIS — K589 Irritable bowel syndrome without diarrhea: Secondary | ICD-10-CM | POA: Diagnosis present

## 2021-01-17 DIAGNOSIS — Z7982 Long term (current) use of aspirin: Secondary | ICD-10-CM | POA: Diagnosis not present

## 2021-01-17 DIAGNOSIS — Z79899 Other long term (current) drug therapy: Secondary | ICD-10-CM | POA: Insufficient documentation

## 2021-01-17 DIAGNOSIS — I2510 Atherosclerotic heart disease of native coronary artery without angina pectoris: Secondary | ICD-10-CM | POA: Insufficient documentation

## 2021-01-17 DIAGNOSIS — E782 Mixed hyperlipidemia: Secondary | ICD-10-CM | POA: Diagnosis not present

## 2021-01-17 DIAGNOSIS — I10 Essential (primary) hypertension: Secondary | ICD-10-CM | POA: Diagnosis not present

## 2021-01-17 DIAGNOSIS — Z85828 Personal history of other malignant neoplasm of skin: Secondary | ICD-10-CM | POA: Insufficient documentation

## 2021-01-17 DIAGNOSIS — R519 Headache, unspecified: Secondary | ICD-10-CM | POA: Diagnosis not present

## 2021-01-17 DIAGNOSIS — G4486 Cervicogenic headache: Secondary | ICD-10-CM | POA: Diagnosis present

## 2021-01-17 DIAGNOSIS — F4321 Adjustment disorder with depressed mood: Secondary | ICD-10-CM | POA: Diagnosis not present

## 2021-01-17 DIAGNOSIS — E785 Hyperlipidemia, unspecified: Secondary | ICD-10-CM | POA: Diagnosis present

## 2021-01-17 DIAGNOSIS — G4451 Hemicrania continua: Secondary | ICD-10-CM | POA: Diagnosis not present

## 2021-01-17 DIAGNOSIS — I739 Peripheral vascular disease, unspecified: Secondary | ICD-10-CM | POA: Diagnosis present

## 2021-01-17 DIAGNOSIS — G8929 Other chronic pain: Secondary | ICD-10-CM | POA: Diagnosis present

## 2021-01-17 LAB — URINALYSIS, COMPLETE (UACMP) WITH MICROSCOPIC
Bacteria, UA: NONE SEEN
Bilirubin Urine: NEGATIVE
Glucose, UA: NEGATIVE mg/dL
Hgb urine dipstick: NEGATIVE
Ketones, ur: 5 mg/dL — AB
Nitrite: NEGATIVE
Protein, ur: 30 mg/dL — AB
Specific Gravity, Urine: 1.019 (ref 1.005–1.030)
pH: 6 (ref 5.0–8.0)

## 2021-01-17 LAB — COMPREHENSIVE METABOLIC PANEL
ALT: 13 U/L (ref 0–44)
AST: 19 U/L (ref 15–41)
Albumin: 4.3 g/dL (ref 3.5–5.0)
Alkaline Phosphatase: 86 U/L (ref 38–126)
Anion gap: 10 (ref 5–15)
BUN: 16 mg/dL (ref 8–23)
CO2: 25 mmol/L (ref 22–32)
Calcium: 9.4 mg/dL (ref 8.9–10.3)
Chloride: 103 mmol/L (ref 98–111)
Creatinine, Ser: 0.94 mg/dL (ref 0.44–1.00)
GFR, Estimated: >60 mL/min (ref >60)
Glucose, Bld: 135 mg/dL — ABNORMAL HIGH (ref 70–99)
Potassium: 3.7 mmol/L (ref 3.5–5.1)
Sodium: 138 mmol/L (ref 135–145)
Total Bilirubin: 0.6 mg/dL (ref 0.3–1.2)
Total Protein: 8.1 g/dL (ref 6.5–8.1)

## 2021-01-17 LAB — DIFFERENTIAL
Abs Immature Granulocytes: 0.07 10*3/uL (ref 0.00–0.07)
Basophils Absolute: 0.1 10*3/uL (ref 0.0–0.1)
Basophils Relative: 0 %
Eosinophils Absolute: 0.1 10*3/uL (ref 0.0–0.5)
Eosinophils Relative: 1 %
Immature Granulocytes: 1 %
Lymphocytes Relative: 14 %
Lymphs Abs: 2.1 10*3/uL (ref 0.7–4.0)
Monocytes Absolute: 0.8 10*3/uL (ref 0.1–1.0)
Monocytes Relative: 5 %
Neutro Abs: 12.2 10*3/uL — ABNORMAL HIGH (ref 1.7–7.7)
Neutrophils Relative %: 79 %

## 2021-01-17 LAB — CBC
HCT: 37.4 % (ref 36.0–46.0)
Hemoglobin: 11.9 g/dL — ABNORMAL LOW (ref 12.0–15.0)
MCH: 27.2 pg (ref 26.0–34.0)
MCHC: 31.8 g/dL (ref 30.0–36.0)
MCV: 85.6 fL (ref 80.0–100.0)
Platelets: 158 10*3/uL (ref 150–400)
RBC: 4.37 MIL/uL (ref 3.87–5.11)
RDW: 13.4 % (ref 11.5–15.5)
WBC: 15.3 10*3/uL — ABNORMAL HIGH (ref 4.0–10.5)
nRBC: 0 % (ref 0.0–0.2)

## 2021-01-17 LAB — SEDIMENTATION RATE: Sed Rate: 57 mm/hr — ABNORMAL HIGH (ref 0–30)

## 2021-01-17 LAB — TROPONIN I (HIGH SENSITIVITY): Troponin I (High Sensitivity): 4 ng/L (ref <18)

## 2021-01-17 LAB — PROTIME-INR
INR: 1 (ref 0.8–1.2)
Prothrombin Time: 12.4 seconds (ref 11.4–15.2)

## 2021-01-17 LAB — APTT: aPTT: 29 seconds (ref 24–36)

## 2021-01-17 MED ORDER — CYCLOBENZAPRINE HCL 10 MG PO TABS
10.0000 mg | ORAL_TABLET | Freq: Once | ORAL | Status: AC
  Administered 2021-01-17: 10 mg via ORAL
  Filled 2021-01-17: qty 1

## 2021-01-17 MED ORDER — KETOROLAC TROMETHAMINE 30 MG/ML IJ SOLN
15.0000 mg | Freq: Once | INTRAMUSCULAR | Status: AC
  Administered 2021-01-17: 15 mg via INTRAVENOUS
  Filled 2021-01-17: qty 1

## 2021-01-17 MED ORDER — KETOROLAC TROMETHAMINE 30 MG/ML IJ SOLN: 30.0000 mg | Freq: Once | INTRAMUSCULAR | Status: DC

## 2021-01-17 MED ORDER — SODIUM CHLORIDE 0.9 % IV SOLN
1000.0000 mg | Freq: Once | INTRAVENOUS | Status: AC
  Administered 2021-01-17: 1000 mg via INTRAVENOUS
  Filled 2021-01-17: qty 8

## 2021-01-17 MED ORDER — ONDANSETRON 4 MG PO TBDP
4.0000 mg | ORAL_TABLET | Freq: Once | ORAL | Status: AC | PRN
  Administered 2021-01-17: 4 mg via ORAL
  Filled 2021-01-17: qty 1

## 2021-01-17 MED ORDER — METOCLOPRAMIDE HCL 5 MG/ML IJ SOLN
10.0000 mg | Freq: Once | INTRAMUSCULAR | Status: AC
  Administered 2021-01-17: 10 mg via INTRAVENOUS
  Filled 2021-01-17: qty 2

## 2021-01-17 MED ORDER — DIPHENHYDRAMINE HCL 50 MG/ML IJ SOLN
12.5000 mg | Freq: Once | INTRAMUSCULAR | Status: AC
  Administered 2021-01-17: 12.5 mg via INTRAVENOUS
  Filled 2021-01-17: qty 1

## 2021-01-17 MED ORDER — ACETAMINOPHEN 500 MG PO TABS
1000.0000 mg | ORAL_TABLET | Freq: Once | ORAL | Status: AC
  Administered 2021-01-17: 1000 mg via ORAL
  Filled 2021-01-17: qty 2
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
## 2021-01-17 NOTE — ED Triage Notes (Signed)
PT to ED via POV from home c/o headache since around noon today. Droop noted to right eye which pt states is normal. Equal grip strenght, no drift noted to arms or legs. EDP Funke to room to evaluate pt. Verbal orders for STAT CT head, no need for code stroke.  Headache so bad pt is vomiting
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
## 2021-01-17 NOTE — ED Notes (Signed)
Patient transported to MRI 
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
## 2021-01-18 DIAGNOSIS — E785 Hyperlipidemia, unspecified: Secondary | ICD-10-CM | POA: Diagnosis not present

## 2021-01-18 DIAGNOSIS — F419 Anxiety disorder, unspecified: Secondary | ICD-10-CM

## 2021-01-18 DIAGNOSIS — K219 Gastro-esophageal reflux disease without esophagitis: Secondary | ICD-10-CM | POA: Diagnosis not present

## 2021-01-18 DIAGNOSIS — R519 Headache, unspecified: Secondary | ICD-10-CM | POA: Diagnosis not present

## 2021-01-18 DIAGNOSIS — I10 Essential (primary) hypertension: Secondary | ICD-10-CM | POA: Diagnosis not present

## 2021-01-18 DIAGNOSIS — N390 Urinary tract infection, site not specified: Secondary | ICD-10-CM | POA: Diagnosis not present

## 2021-01-18 LAB — RESP PANEL BY RT-PCR (FLU A&B, COVID) ARPGX2
Influenza A by PCR: NEGATIVE
Influenza B by PCR: NEGATIVE
SARS Coronavirus 2 by RT PCR: NEGATIVE

## 2021-01-18 MED ORDER — PANTOPRAZOLE SODIUM 40 MG PO TBEC
80.0000 mg | DELAYED_RELEASE_TABLET | Freq: Every day | ORAL | Status: DC
  Administered 2021-01-18: 80 mg via ORAL
  Filled 2021-01-18: qty 2

## 2021-01-18 MED ORDER — ALPRAZOLAM 0.5 MG PO TABS
0.5000 mg | ORAL_TABLET | Freq: Two times a day (BID) | ORAL | Status: DC | PRN
  Administered 2021-01-18: 02:00:00 0.5 mg via ORAL
  Filled 2021-01-18: qty 1

## 2021-01-18 MED ORDER — ACETAMINOPHEN 650 MG RE SUPP: 650.0000 mg | Freq: Four times a day (QID) | RECTAL | Status: DC | PRN

## 2021-01-18 MED ORDER — LOSARTAN POTASSIUM 50 MG PO TABS
50.0000 mg | ORAL_TABLET | Freq: Every day | ORAL | Status: DC
  Administered 2021-01-18: 50 mg via ORAL
  Filled 2021-01-18: qty 1

## 2021-01-18 MED ORDER — POLYETHYLENE GLYCOL 3350 17 G PO PACK: 17.0000 g | PACK | Freq: Every day | ORAL | Status: DC | PRN

## 2021-01-18 MED ORDER — ATORVASTATIN CALCIUM 20 MG PO TABS
10.0000 mg | ORAL_TABLET | Freq: Every day | ORAL | Status: DC
  Administered 2021-01-18: 09:00:00 10 mg via ORAL
  Filled 2021-01-18: qty 1

## 2021-01-18 MED ORDER — ONDANSETRON HCL 4 MG PO TABS: 4.0000 mg | ORAL_TABLET | Freq: Four times a day (QID) | ORAL | Status: DC | PRN

## 2021-01-18 MED ORDER — CALCIUM CARBONATE-VITAMIN D 500-200 MG-UNIT PO TABS
1.0000 | ORAL_TABLET | Freq: Every day | ORAL | Status: DC
  Administered 2021-01-18: 1 via ORAL
  Filled 2021-01-18: qty 1

## 2021-01-18 MED ORDER — METHOCARBAMOL 1000 MG/10ML IJ SOLN
500.0000 mg | Freq: Four times a day (QID) | INTRAVENOUS | Status: DC | PRN
  Filled 2021-01-18: qty 5

## 2021-01-18 MED ORDER — ASPIRIN EC 81 MG PO TBEC
81.0000 mg | DELAYED_RELEASE_TABLET | Freq: Every day | ORAL | Status: DC
  Administered 2021-01-18: 09:00:00 81 mg via ORAL
  Filled 2021-01-18: qty 1

## 2021-01-18 MED ORDER — ONDANSETRON HCL 4 MG/2ML IJ SOLN: 4.0000 mg | Freq: Four times a day (QID) | INTRAMUSCULAR | Status: DC | PRN

## 2021-01-18 MED ORDER — CEPHALEXIN 250 MG PO CAPS
250.0000 mg | ORAL_CAPSULE | Freq: Every day | ORAL | Status: DC
  Administered 2021-01-18: 250 mg via ORAL
  Filled 2021-01-18: qty 1

## 2021-01-18 MED ORDER — TRAZODONE HCL 50 MG PO TABS: 50.0000 mg | ORAL_TABLET | Freq: Every evening | ORAL | Status: DC | PRN

## 2021-01-18 MED ORDER — ACETAMINOPHEN 325 MG PO TABS: 650.0000 mg | ORAL_TABLET | Freq: Four times a day (QID) | ORAL | Status: DC | PRN

## 2021-01-18 MED ORDER — ATENOLOL 25 MG PO TABS
25.0000 mg | ORAL_TABLET | Freq: Every day | ORAL | Status: DC
  Administered 2021-01-18: 09:00:00 25 mg via ORAL
  Filled 2021-01-18: qty 1
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
## 2021-01-18 NOTE — Plan of Care (Signed)
End of Shift Summary:  Alert and oriented x4. VSS since arrival to unit. Headache pain decreased with ED interventions per pt. Denies n/v. Xanax given x1 for anxiety. Pt resting quietly with eyes closed. Q4h neuro checks. mNIHSS score of 0. Voids. Remained free from falls or injury. Call bell within reach and able to use.   Problem: Pain Managment: Goal: General experience of comfort will improve Outcome: Progressing   Problem: Safety: Goal: Ability to remain free from injury will improve Outcome: Progressing   Problem: Health Behavior/Discharge Planning: Goal: Ability to manage health-related needs will improve Outcome: Progressing   Problem: Education: Goal: Knowledge of General Education information will improve Description: Including pain rating scale, medication(s)/side effects and non-pharmacologic comfort measures Outcome: Progressing

## 2021-01-18 NOTE — Progress Notes (Signed)
Ambulated to bed from stretcher without assistance, then bathroom to void. Skin swarm completed with Lonn Georgia, RN. No skin issues identified. Reports no nausea and pain decreased from earlier, now 2/10. Oriented to room and call light system, bed low and in locked position. Call bell within reach and able to use.

## 2021-01-28 ENCOUNTER — Inpatient Hospital Stay: Payer: PPO | Admitting: Family Medicine

## 2021-01-28 ENCOUNTER — Other Ambulatory Visit: Payer: Self-pay

## 2021-01-28 ENCOUNTER — Encounter: Payer: Self-pay | Admitting: Family Medicine

## 2021-01-28 ENCOUNTER — Ambulatory Visit (INDEPENDENT_AMBULATORY_CARE_PROVIDER_SITE_OTHER): Payer: PPO | Admitting: Family Medicine

## 2021-01-28 VITALS — BP 150/62 | HR 81 | Temp 97.5°F | Ht 64.0 in | Wt 150.6 lb

## 2021-01-28 DIAGNOSIS — N952 Postmenopausal atrophic vaginitis: Secondary | ICD-10-CM

## 2021-01-28 DIAGNOSIS — I10 Essential (primary) hypertension: Secondary | ICD-10-CM

## 2021-01-28 DIAGNOSIS — G4451 Hemicrania continua: Secondary | ICD-10-CM

## 2021-01-28 DIAGNOSIS — I771 Stricture of artery: Secondary | ICD-10-CM

## 2021-01-28 MED ORDER — ATENOLOL 25 MG PO TABS: 25.0000 mg | ORAL_TABLET | Freq: Two times a day (BID) | ORAL | 1 refills | Status: DC

## 2021-01-28 NOTE — Assessment & Plan Note (Signed)
BP remaining elevated - will increase atenolol to BID dosing, she will start monitoring blood pressures at home and let me know if consistently too high or low.

## 2021-01-28 NOTE — Patient Instructions (Addendum)
Start checking blood pressures at home, increase atenolol to 25mg  twice daily. Let us know if persistently staying too low or too high.  Ensure sleeping well, ensure good hydration status.  Let us know if recurrent headache.

## 2021-01-28 NOTE — Assessment & Plan Note (Signed)
Bilateral blood pressures stable.

## 2021-01-28 NOTE — Assessment & Plan Note (Signed)
Recent left frontal headache associated with nausea/vomiting s/p reassuring hospital evaluation presumed migraine related, treated with toradol and flexeril. Headache has since resolved. Supportive care reviewed. Update if recurrent headache to consider further eval r/o temporal arteritis.  Known h/o cervicogenic headaches.

## 2021-01-28 NOTE — Progress Notes (Signed)
  Patient ID: Bianca Fry, female    DOB: 05/04/1946, 75 y.o.   MRN: [id number]  This visit was conducted in person.  BP (!) 150/62 (BP Location: Right Arm, Cuff Size: Normal)   Pulse 81   Temp (!) 97.5 F (36.4 C) (Temporal)   Ht 5' 4" (1.626 m)   Wt 150 lb 9 oz (68.3 kg)   LMP 10/20/1979 (Within Years)   SpO2 96%   BMI 25.84 kg/m   BP Readings from Last 3 Encounters:  01/28/21 (!) 150/62  01/18/21 (!) 145/67  01/08/21 122/70    CC: hosp f/u visit  Subjective:   HPI: Bianca Fry is a 75 y.o. female presenting on 01/28/2021 for Hospitalization Follow-up (Admitted on 01/17/21 to [facility], dx acute nonintractable HA, unspecified HA. )   Recent hospital records reviewed. Recent hospitalization for severe headache to left frontal and maxillary region. MRI brain showed R mastoid effusion and chronic microvascular ischemia. Presumed migraine etiology. Flexeril/toradol treatment seemed to help as well as sleep. ESR borderline at 57 however no pain over temporal region.   Describes pain to L frontal region progressively worsened no benefit with 2 excedrin migraine, associated with nausea/vomiting. No photo/phonophobia. No h/o migraines. No autonomic symptoms with headache.   No further headache since she's been home.  Notes persistent fatigue.   BP remaining elevated despite atenolol 25mg daily and losartan 50mg daily. Hasn't been checking BP at home.   Reviewed stressors in setting of husband's illness - she is his caregiver. Not interested in medication at this time.    Date of admission: 01/17/2021 Date of discharge 01/18/2021 TCM hosp f/u phone call not completed.   Discharge diagnosis: Active Problems:   HLD (hyperlipidemia)   Adjustment disorder with depressed mood   Essential hypertension   Recurrent UTI   Abdominal pain, chronic, left upper quadrant   Congenital ptosis of right eyelid   Irritable bowel syndrome (IBS)   PAD (peripheral artery disease) (HCC)   Headache      Relevant past medical, surgical, family and social history reviewed and updated as indicated. Interim medical history since our last visit reviewed. Allergies and medications reviewed and updated. Outpatient Medications Prior to Visit  Medication Sig Dispense Refill  . ALPRAZolam (XANAX) 0.5 MG tablet Take 1 tablet (0.5 mg total) by mouth 2 (two) times daily as needed for anxiety. 40 tablet 0  . aspirin EC 81 MG EC tablet Take 1 tablet (81 mg total) by mouth daily. 30 tablet 2  . atorvastatin (LIPITOR) 10 MG tablet Take 1 tablet (10 mg total) by mouth daily. 90 tablet 3  . Calcium Carb-Cholecalciferol (CALCIUM-VITAMIN D) 600-400 MG-UNIT TABS Take 1 tablet by mouth daily.    . cephALEXin (KEFLEX) 250 MG capsule Take 250 mg by mouth daily.  99  . Cholecalciferol (VITAMIN D3 PO) Take by mouth daily.     . clindamycin (CLEOCIN) 150 MG capsule TAKE FOUR CAPSULES BY MOUTH ONE HOUR BEFORE APPOINTMENT    . D-MANNOSE PO Take by mouth. With cranberry.  OTC    . fexofenadine (ALLEGRA) 180 MG tablet Take 180 mg by mouth daily as needed for allergies.    . losartan (COZAAR) 50 MG tablet Take 1 tablet (50 mg total) by mouth daily. 90 tablet 3  . meloxicam (MOBIC) 7.5 MG tablet Take 7.5 mg by mouth daily.    . omeprazole (PRILOSEC) 40 MG capsule Take 1 capsule by mouth once daily 90 capsule 3  . ondansetron (ZOFRAN)   4 MG tablet Take 1 tablet (4 mg total) by mouth every 8 (eight) hours as needed for nausea or vomiting.    . valACYclovir (VALTREX) 1000 MG tablet TAKE 2 TABLETS BY MOUTH IN THE MORNING AND 2 IN THE EVENING FOR 1 DAY ONLY AS NEEDEED FOR FLARE    . atenolol (TENORMIN) 25 MG tablet Take 1 tablet by mouth once daily 90 tablet 3  . dicyclomine (BENTYL) 10 MG capsule TAKE 1 CAPSULE BY MOUTH TWICE DAILY AS NEEDED FOR SPASMS 90 capsule   . estradiol (ESTRACE) 0.1 MG/GM vaginal cream USE ONE-HALF GRAM VAGINALLY THREE TIMES WEEKLY 43 g    No facility-administered medications prior to visit.     Per  HPI unless specifically indicated in ROS section below Review of Systems Objective:  BP (!) 150/62 (BP Location: Right Arm, Cuff Size: Normal)   Pulse 81   Temp (!) 97.5 F (36.4 C) (Temporal)   Ht 5' 4" (1.626 m)   Wt 150 lb 9 oz (68.3 kg)   LMP 10/20/1979 (Within Years)   SpO2 96%   BMI 25.84 kg/m   Wt Readings from Last 3 Encounters:  01/28/21 150 lb 9 oz (68.3 kg)  01/18/21 145 lb 1 oz (65.8 kg)  01/08/21 150 lb (68 kg)      Physical Exam Vitals and nursing note reviewed.  Constitutional:      Appearance: Normal appearance. She is not ill-appearing.  Eyes:     Extraocular Movements: Extraocular movements intact.     Pupils: Pupils are equal, round, and reactive to light.  Neck:     Vascular: Carotid bruit (mild L sided) present.  Cardiovascular:     Rate and Rhythm: Normal rate and regular rhythm.     Pulses: Normal pulses.     Heart sounds: Normal heart sounds. No murmur heard.   Pulmonary:     Effort: Pulmonary effort is normal. No respiratory distress.     Breath sounds: Normal breath sounds. No wheezing, rhonchi or rales.  Musculoskeletal:     Cervical back: Normal range of motion and neck supple.  Lymphadenopathy:     Cervical: No cervical adenopathy.  Neurological:     Mental Status: She is alert.  Psychiatric:        Mood and Affect: Mood normal.        Behavior: Behavior normal.       Results for orders placed or performed during the hospital encounter of 01/17/21  Resp Panel by RT-PCR (Flu A&B, Covid) Nasopharyngeal Swab   Specimen: Nasopharyngeal Swab; Nasopharyngeal(NP) swabs in vial transport medium  Result Value Ref Range   SARS Coronavirus 2 by RT PCR NEGATIVE NEGATIVE   Influenza A by PCR NEGATIVE NEGATIVE   Influenza B by PCR NEGATIVE NEGATIVE  Protime-INR  Result Value Ref Range   Prothrombin Time 12.4 11.4 - 15.2 seconds   INR 1.0 0.8 - 1.2  APTT  Result Value Ref Range   aPTT 29 24 - 36 seconds  CBC  Result Value Ref Range   WBC  15.3 (H) 4.0 - 10.5 K/uL   RBC 4.37 3.87 - 5.11 MIL/uL   Hemoglobin 11.9 (L) 12.0 - 15.0 g/dL   HCT 37.4 36.0 - 46.0 %   MCV 85.6 80.0 - 100.0 fL   MCH 27.2 26.0 - 34.0 pg   MCHC 31.8 30.0 - 36.0 g/dL   RDW 13.4 11.5 - 15.5 %   Platelets 158 150 - 400 K/uL   nRBC 0.0 0.0 -  0.2 %  Differential  Result Value Ref Range   Neutrophils Relative % 79 %   Neutro Abs 12.2 (H) 1.7 - 7.7 K/uL   Lymphocytes Relative 14 %   Lymphs Abs 2.1 0.7 - 4.0 K/uL   Monocytes Relative 5 %   Monocytes Absolute 0.8 0.1 - 1.0 K/uL   Eosinophils Relative 1 %   Eosinophils Absolute 0.1 0.0 - 0.5 K/uL   Basophils Relative 0 %   Basophils Absolute 0.1 0.0 - 0.1 K/uL   Immature Granulocytes 1 %   Abs Immature Granulocytes 0.07 0.00 - 0.07 K/uL  Comprehensive metabolic panel  Result Value Ref Range   Sodium 138 135 - 145 mmol/L   Potassium 3.7 3.5 - 5.1 mmol/L   Chloride 103 98 - 111 mmol/L   CO2 25 22 - 32 mmol/L   Glucose, Bld 135 (H) 70 - 99 mg/dL   BUN 16 8 - 23 mg/dL   Creatinine, Ser 0.94 0.44 - 1.00 mg/dL   Calcium 9.4 8.9 - 10.3 mg/dL   Total Protein 8.1 6.5 - 8.1 g/dL   Albumin 4.3 3.5 - 5.0 g/dL   AST 19 15 - 41 U/L   ALT 13 0 - 44 U/L   Alkaline Phosphatase 86 38 - 126 U/L   Total Bilirubin 0.6 0.3 - 1.2 mg/dL   GFR, Estimated >60 >60 mL/min   Anion gap 10 5 - 15  Urinalysis, Complete w Microscopic  Result Value Ref Range   Color, Urine YELLOW (A) YELLOW   APPearance CLEAR (A) CLEAR   Specific Gravity, Urine 1.019 1.005 - 1.030   pH 6.0 5.0 - 8.0   Glucose, UA NEGATIVE NEGATIVE mg/dL   Hgb urine dipstick NEGATIVE NEGATIVE   Bilirubin Urine NEGATIVE NEGATIVE   Ketones, ur 5 (A) NEGATIVE mg/dL   Protein, ur 30 (A) NEGATIVE mg/dL   Nitrite NEGATIVE NEGATIVE   Leukocytes,Ua TRACE (A) NEGATIVE   RBC / HPF 0-5 0 - 5 RBC/hpf   WBC, UA 11-20 0 - 5 WBC/hpf   Bacteria, UA NONE SEEN NONE SEEN   Squamous Epithelial / LPF 0-5 0 - 5   Mucus PRESENT   Sedimentation rate  Result Value Ref  Range   Sed Rate 57 (H) 0 - 30 mm/hr  Troponin I (High Sensitivity)  Result Value Ref Range   Troponin I (High Sensitivity) 4 <18 ng/L   Assessment & Plan:  This visit occurred during the SARS-CoV-2 public health emergency.  Safety protocols were in place, including screening questions prior to the visit, additional usage of staff PPE, and extensive cleaning of exam room while observing appropriate contact time as indicated for disinfecting solutions.   Problem List Items Addressed This Visit    Essential hypertension    BP remaining elevated - will increase atenolol to BID dosing, she will start monitoring blood pressures at home and let me know if consistently too high or low.       Relevant Medications   atenolol (TENORMIN) 25 MG tablet   Stenosis of right subclavian artery (HCC)    Bilateral blood pressures stable.       Relevant Medications   atenolol (TENORMIN) 25 MG tablet   Headache - Primary    Recent left frontal headache associated with nausea/vomiting s/p reassuring hospital evaluation presumed migraine related, treated with toradol and flexeril. Headache has since resolved. Supportive care reviewed. Update if recurrent headache to consider further eval r/o temporal arteritis.  Known h/o cervicogenic headaches.         Relevant Medications   atenolol (TENORMIN) 25 MG tablet    Other Visit Diagnoses    Vaginal atrophy       Relevant Medications   estradiol (ESTRACE) 0.1 MG/GM vaginal cream       Meds ordered this encounter  Medications  . atenolol (TENORMIN) 25 MG tablet    Sig: Take 1 tablet (25 mg total) by mouth 2 (two) times daily.    Dispense:  180 tablet    Refill:  1    Note new sig   No orders of the defined types were placed in this encounter.   Patient Instructions  Start checking blood pressures at home, increase atenolol to 25mg twice daily. Let us know if persistently staying too low or too high.  Ensure sleeping well, ensure good hydration  status.  Let us know if recurrent headache.    Follow up plan: Return if symptoms worsen or fail to improve.   , MD   

## 2021-02-10 DIAGNOSIS — H02051 Trichiasis without entropian right upper eyelid: Secondary | ICD-10-CM | POA: Diagnosis not present
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
## 2021-02-12 ENCOUNTER — Ambulatory Visit: Payer: PPO | Admitting: Cardiovascular Disease

## 2021-02-12 ENCOUNTER — Encounter: Payer: Self-pay | Admitting: Cardiovascular Disease

## 2021-02-12 ENCOUNTER — Other Ambulatory Visit: Payer: Self-pay

## 2021-02-12 VITALS — BP 140/60 | HR 71 | Ht 64.0 in | Wt 149.5 lb

## 2021-02-12 DIAGNOSIS — I10 Essential (primary) hypertension: Secondary | ICD-10-CM | POA: Diagnosis not present

## 2021-02-12 DIAGNOSIS — R079 Chest pain, unspecified: Secondary | ICD-10-CM | POA: Diagnosis not present

## 2021-02-12 DIAGNOSIS — E782 Mixed hyperlipidemia: Secondary | ICD-10-CM | POA: Diagnosis not present

## 2021-02-12 DIAGNOSIS — F411 Generalized anxiety disorder: Secondary | ICD-10-CM

## 2021-02-12 DIAGNOSIS — R7303 Prediabetes: Secondary | ICD-10-CM

## 2021-02-12 DIAGNOSIS — I739 Peripheral vascular disease, unspecified: Secondary | ICD-10-CM

## 2021-02-12 NOTE — Patient Instructions (Signed)
Medication Instructions:  No changes  If you need a refill on your cardiac medications before your next appointment, please call your pharmacy.    Lab work: No new labs needed   If you have labs (blood work) drawn today and your tests are completely normal, you will receive your results only by: . MyChart Message (if you have MyChart) OR . A paper copy in the mail If you have any lab test that is abnormal or we need to change your treatment, we will call you to review the results.   Testing/Procedures: No new testing needed   Follow-Up: At [facility], you and your health needs are our priority.  As part of our continuing mission to provide you with exceptional heart care, we have created designated Provider Care Teams.  These Care Teams include your primary Cardiologist (physician) and Advanced Practice Providers (APPs -  Physician Assistants and Nurse Practitioners) who all work together to provide you with the care you need, when you need it.  . You will need a follow up appointment as needed  . Providers on your designated Care Team:   . Christopher Berge, NP . Ryan Dunn, PA-C . Jacquelyn Visser, PA-C  Any Other Special Instructions Will Be Listed Below (If Applicable).  COVID-19 Vaccine Information can be found at: [email or web address] For questions related to vaccine distribution or appointments, please email [email or web address] or call [phone].     

## 2021-02-14 ENCOUNTER — Telehealth: Payer: Self-pay | Admitting: Cardiovascular Disease

## 2021-02-14 ENCOUNTER — Other Ambulatory Visit: Payer: Self-pay

## 2021-02-14 MED ORDER — LOSARTAN POTASSIUM 50 MG PO TABS
50.0000 mg | ORAL_TABLET | Freq: Every day | ORAL | 3 refills | Status: DC
Start: 2021-02-14 — End: 2021-03-31

## 2021-02-14 NOTE — Telephone Encounter (Signed)
*  STAT* If patient is at the pharmacy, call can be transferred to refill team.   1. Which medications need to be refilled? (please list name of each medication and dose if known)  Losartan 50 MG 1 tablet daily   2. Which pharmacy/location (including street and city if local pharmacy) is medication to be sent to? Switch from [facility] to [facility] pharmacy   3. Do they need a 30 day or 90 day supply? 90 day

## 2021-02-16 DIAGNOSIS — Z8616 Personal history of COVID-19: Secondary | ICD-10-CM

## 2021-02-16 HISTORY — DX: Personal history of COVID-19: Z86.16

## 2021-02-19 ENCOUNTER — Other Ambulatory Visit: Payer: Self-pay

## 2021-02-19 ENCOUNTER — Telehealth (INDEPENDENT_AMBULATORY_CARE_PROVIDER_SITE_OTHER): Payer: PPO | Admitting: Registered Nurse

## 2021-02-19 ENCOUNTER — Encounter: Payer: Self-pay | Admitting: Registered Nurse

## 2021-02-19 ENCOUNTER — Telehealth: Payer: PPO | Admitting: Family Medicine

## 2021-02-19 VITALS — BP 132/86 | Temp 98.1°F

## 2021-02-19 DIAGNOSIS — J069 Acute upper respiratory infection, unspecified: Secondary | ICD-10-CM | POA: Diagnosis not present

## 2021-02-19 MED ORDER — LEVOFLOXACIN 500 MG PO TABS: 500.0000 mg | ORAL_TABLET | Freq: Every day | ORAL | 0 refills | Status: AC

## 2021-02-19 MED ORDER — AZELASTINE HCL 0.1 % NA SOLN: 1.0000 | Freq: Two times a day (BID) | NASAL | 12 refills | Status: DC

## 2021-02-19 NOTE — Patient Instructions (Addendum)
  Ms Muriel -   Good to speak with you  Let's pursue Levaquin 500mg  daily for a week and azelastine nasal spray one puff each nostril daily for relief. Ok to continue OTC relief as you have been doing.   COVID testing for you will likely be most convenient at the [place] across the street from the [facility] where your medication will be sent. It should be covered by insurance. They'll likely have take home tests in stock if you'd prefer that to waiting for pharmacy testing to return in 1-2 days.  Let me know if you have questions,   Thanks,  Rich    If you have lab work done today you will be contacted with your lab results within the next 2 weeks.  If you have not heard from [place] then please contact us. The fastest way to get your results is to register for My Chart.   IF you received an x-ray today, you will receive an invoice from [facility]. Please contact [facility] at [phone] with questions or concerns regarding your invoice.   IF you received labwork today, you will receive an invoice from [facility]. Please contact [facility] at [phone] with questions or concerns regarding your invoice.   Our billing staff will not be able to assist you with questions regarding bills from these companies.  You will be contacted with the lab results as soon as they are available. The fastest way to get your results is to activate your My Chart account. Instructions are located on the last page of this paperwork. If you have not heard from [place] regarding the results in 2 weeks, please contact this office.
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
## 2021-02-20 DIAGNOSIS — Z20822 Contact with and (suspected) exposure to covid-19: Secondary | ICD-10-CM | POA: Diagnosis not present

## 2021-02-25 ENCOUNTER — Other Ambulatory Visit: Payer: Self-pay | Admitting: Family Medicine

## 2021-02-25 NOTE — Telephone Encounter (Signed)
Last filled 12-12-20 #40 Last OV 01-28-21 Next OV 05-20-21 [facility]

## 2021-02-26 NOTE — Telephone Encounter (Signed)
ERx 

## 2021-03-10 ENCOUNTER — Telehealth: Payer: Self-pay | Admitting: Family Medicine

## 2021-03-10 NOTE — Chronic Care Management (AMB) (Signed)
  Chronic Care Management   Note  03/10/2021 Name: [name] MRN: [id number] DOB: 1946-04-11  Bianca Fry is a 75 y.o. year old female who is a primary care patient of Ria Bush, MD. I reached out to Bianca Fry by phone today in response to a referral sent by Ms. Pablo Ledger Counce's PCP, Ria Bush, MD.   Ms. Andres was given information about Chronic Care Management services today including:  1. CCM service includes personalized support from designated clinical staff supervised by her physician, including individualized plan of care and coordination with other care providers 2. 24/7 contact phone numbers for assistance for urgent and routine care needs. 3. Service will only be billed when office clinical staff spend 20 minutes or more in a month to coordinate care. 4. Only one practitioner may furnish and bill the service in a calendar month. 5. The patient may stop CCM services at any time (effective at the end of the month) by phone call to the office staff.   Patient agreed to services and verbal consent obtained.   Follow up plan:   Tatjana Secretary/administrator

## 2021-03-18 ENCOUNTER — Emergency Department: Payer: PPO

## 2021-03-18 ENCOUNTER — Encounter: Payer: Self-pay | Admitting: Emergency Medicine

## 2021-03-18 ENCOUNTER — Other Ambulatory Visit: Payer: Self-pay

## 2021-03-18 ENCOUNTER — Telehealth: Payer: Self-pay | Admitting: *Deleted

## 2021-03-18 ENCOUNTER — Inpatient Hospital Stay
Admission: EM | Admit: 2021-03-18 | Discharge: 2021-03-21 | DRG: 393 | Disposition: A | Discharge status: Home / Self Care | Payer: PPO | Attending: Internal Medicine | Admitting: Internal Medicine

## 2021-03-18 ENCOUNTER — Ambulatory Visit
Admission: EM | Admit: 2021-03-18 | Discharge: 2021-03-18 | Disposition: A | Discharge status: Other Acute Inpatient Hospital | Payer: PPO | Source: Home / Self Care

## 2021-03-18 DIAGNOSIS — R1900 Intra-abdominal and pelvic swelling, mass and lump, unspecified site: Secondary | ICD-10-CM | POA: Diagnosis not present

## 2021-03-18 DIAGNOSIS — Z7982 Long term (current) use of aspirin: Secondary | ICD-10-CM | POA: Diagnosis not present

## 2021-03-18 DIAGNOSIS — R109 Unspecified abdominal pain: Secondary | ICD-10-CM | POA: Diagnosis not present

## 2021-03-18 DIAGNOSIS — Z90710 Acquired absence of both cervix and uterus: Secondary | ICD-10-CM | POA: Diagnosis not present

## 2021-03-18 DIAGNOSIS — K559 Vascular disorder of intestine, unspecified: Secondary | ICD-10-CM | POA: Diagnosis present

## 2021-03-18 DIAGNOSIS — K5790 Diverticulosis of intestine, part unspecified, without perforation or abscess without bleeding: Secondary | ICD-10-CM | POA: Diagnosis not present

## 2021-03-18 DIAGNOSIS — F419 Anxiety disorder, unspecified: Secondary | ICD-10-CM | POA: Diagnosis present

## 2021-03-18 DIAGNOSIS — K625 Hemorrhage of anus and rectum: Secondary | ICD-10-CM

## 2021-03-18 DIAGNOSIS — R0602 Shortness of breath: Secondary | ICD-10-CM | POA: Diagnosis not present

## 2021-03-18 DIAGNOSIS — Z9049 Acquired absence of other specified parts of digestive tract: Secondary | ICD-10-CM

## 2021-03-18 DIAGNOSIS — U071 COVID-19: Secondary | ICD-10-CM | POA: Diagnosis present

## 2021-03-18 DIAGNOSIS — K5730 Diverticulosis of large intestine without perforation or abscess without bleeding: Secondary | ICD-10-CM | POA: Diagnosis present

## 2021-03-18 DIAGNOSIS — E785 Hyperlipidemia, unspecified: Secondary | ICD-10-CM | POA: Diagnosis present

## 2021-03-18 DIAGNOSIS — R7303 Prediabetes: Secondary | ICD-10-CM | POA: Diagnosis present

## 2021-03-18 DIAGNOSIS — K649 Unspecified hemorrhoids: Secondary | ICD-10-CM | POA: Diagnosis not present

## 2021-03-18 DIAGNOSIS — I739 Peripheral vascular disease, unspecified: Secondary | ICD-10-CM | POA: Diagnosis present

## 2021-03-18 DIAGNOSIS — J9601 Acute respiratory failure with hypoxia: Secondary | ICD-10-CM | POA: Diagnosis not present

## 2021-03-18 DIAGNOSIS — F411 Generalized anxiety disorder: Secondary | ICD-10-CM | POA: Diagnosis present

## 2021-03-18 DIAGNOSIS — K921 Melena: Secondary | ICD-10-CM | POA: Diagnosis not present

## 2021-03-18 DIAGNOSIS — J690 Pneumonitis due to inhalation of food and vomit: Secondary | ICD-10-CM | POA: Diagnosis present

## 2021-03-18 DIAGNOSIS — I10 Essential (primary) hypertension: Secondary | ICD-10-CM | POA: Diagnosis present

## 2021-03-18 DIAGNOSIS — K219 Gastro-esophageal reflux disease without esophagitis: Secondary | ICD-10-CM | POA: Diagnosis present

## 2021-03-18 DIAGNOSIS — R197 Diarrhea, unspecified: Secondary | ICD-10-CM | POA: Diagnosis not present

## 2021-03-18 DIAGNOSIS — K640 First degree hemorrhoids: Secondary | ICD-10-CM | POA: Diagnosis present

## 2021-03-18 DIAGNOSIS — K5150 Left sided colitis without complications: Secondary | ICD-10-CM | POA: Diagnosis not present

## 2021-03-18 DIAGNOSIS — K529 Noninfective gastroenteritis and colitis, unspecified: Secondary | ICD-10-CM | POA: Diagnosis not present

## 2021-03-18 DIAGNOSIS — J811 Chronic pulmonary edema: Secondary | ICD-10-CM | POA: Diagnosis not present

## 2021-03-18 DIAGNOSIS — R0902 Hypoxemia: Secondary | ICD-10-CM

## 2021-03-18 DIAGNOSIS — K551 Chronic vascular disorders of intestine: Secondary | ICD-10-CM | POA: Diagnosis not present

## 2021-03-18 DIAGNOSIS — Z79899 Other long term (current) drug therapy: Secondary | ICD-10-CM | POA: Diagnosis not present

## 2021-03-18 DIAGNOSIS — I2510 Atherosclerotic heart disease of native coronary artery without angina pectoris: Secondary | ICD-10-CM | POA: Diagnosis present

## 2021-03-18 DIAGNOSIS — Z8616 Personal history of COVID-19: Secondary | ICD-10-CM

## 2021-03-18 DIAGNOSIS — R1030 Lower abdominal pain, unspecified: Secondary | ICD-10-CM | POA: Diagnosis not present

## 2021-03-18 HISTORY — DX: Personal history of COVID-19: Z86.16

## 2021-03-18 LAB — COMPREHENSIVE METABOLIC PANEL
ALT: 14 U/L (ref 0–44)
AST: 20 U/L (ref 15–41)
Albumin: 4.2 g/dL (ref 3.5–5.0)
Alkaline Phosphatase: 93 U/L (ref 38–126)
Anion gap: 12 (ref 5–15)
BUN: 18 mg/dL (ref 8–23)
CO2: 24 mmol/L (ref 22–32)
Calcium: 9.4 mg/dL (ref 8.9–10.3)
Chloride: 101 mmol/L (ref 98–111)
Creatinine, Ser: 0.91 mg/dL (ref 0.44–1.00)
GFR, Estimated: >60 mL/min (ref >60)
Glucose, Bld: 114 mg/dL — ABNORMAL HIGH (ref 70–99)
Potassium: 4 mmol/L (ref 3.5–5.1)
Sodium: 137 mmol/L (ref 135–145)
Total Bilirubin: 0.8 mg/dL (ref 0.3–1.2)
Total Protein: 7.5 g/dL (ref 6.5–8.1)

## 2021-03-18 LAB — URINALYSIS, COMPLETE (UACMP) WITH MICROSCOPIC
Bilirubin Urine: NEGATIVE
Glucose, UA: NEGATIVE mg/dL
Ketones, ur: 5 mg/dL — AB
Nitrite: NEGATIVE
Protein, ur: NEGATIVE mg/dL
Specific Gravity, Urine: 1.018 (ref 1.005–1.030)
pH: 5 (ref 5.0–8.0)

## 2021-03-18 LAB — GASTROINTESTINAL PANEL BY PCR, STOOL (REPLACES STOOL CULTURE)

## 2021-03-18 LAB — CBC
HCT: 39 % (ref 36.0–46.0)
Hemoglobin: 12.7 g/dL (ref 12.0–15.0)
MCH: 28 pg (ref 26.0–34.0)
MCHC: 32.6 g/dL (ref 30.0–36.0)
MCV: 85.9 fL (ref 80.0–100.0)
Platelets: 137 10*3/uL — ABNORMAL LOW (ref 150–400)
RBC: 4.54 MIL/uL (ref 3.87–5.11)
RDW: 14.5 % (ref 11.5–15.5)
WBC: 13.7 10*3/uL — ABNORMAL HIGH (ref 4.0–10.5)
nRBC: 0 % (ref 0.0–0.2)

## 2021-03-18 LAB — LACTIC ACID, PLASMA: Lactic Acid, Venous: 1.9 mmol/L (ref 0.5–1.9)

## 2021-03-18 LAB — TYPE AND SCREEN

## 2021-03-18 LAB — C DIFFICILE QUICK SCREEN W PCR REFLEX
C Diff antigen: NEGATIVE
C Diff interpretation: NOT DETECTED
C Diff toxin: NEGATIVE

## 2021-03-18 MED ORDER — CIPROFLOXACIN IN D5W 400 MG/200ML IV SOLN
400.0000 mg | Freq: Once | INTRAVENOUS | Status: DC
  Filled 2021-03-18: qty 200

## 2021-03-18 MED ORDER — IOHEXOL 350 MG/ML SOLN
100.0000 mL | Freq: Once | INTRAVENOUS | Status: AC | PRN
  Administered 2021-03-18: 100 mL via INTRAVENOUS

## 2021-03-18 NOTE — ED Triage Notes (Signed)
Pt presents with c/o diarrhea and nausea since yesterday , pt states she began having blood in stool , reports h/o ischemic colitis
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
## 2021-03-18 NOTE — ED Triage Notes (Addendum)
Pt arrived via POV with reports of lower abd pain, diarrhea and bloody stools. Pt states the abd started last night while the diarrhea and bloody stools started today.  Pt states in 2013 pt had ischemic colitis and states sxs were similar then.  Pt also reports nausea. Pt takes ASA 81mg  daily no other blood thinning medications.   Pt states she was at [facility] in [facility] prior to coming to ED. Stools samples pending from [place].
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
## 2021-03-18 NOTE — Telephone Encounter (Signed)
Noted. plz call tomorrow for update on symptoms.

## 2021-03-18 NOTE — Telephone Encounter (Signed)
Patient called stating that she started with some abdominal pain last night and felt like she had to go to the bathroom. Patient stated that she started with diarrhea today and it has blood in it . Patient stated that she has a history of ischemic colitis.  Patient stated that she has been given an antibiotic for this in the past, but can not take [place]. Patient stated that she still has some abdominal pain, diarrhea with blood in it and nausea. Patient was advised that she should go to  an UC for a face to face evaluation. Patient was given information on [facility]. Patient stated that she will get dressed and head to the UC in [place] now.

## 2021-03-18 NOTE — Discharge Instructions (Signed)
Go to ER for further work up [employer]  Your rapid PCR C deficil test is neg

## 2021-03-18 NOTE — ED Notes (Signed)
Patient is being discharged from the Urgent Care and sent to the Emergency Department via POV . Per Audery Amel, PA, patient is in need of higher level of care due to bloody diarrhea. Patient is aware and verbalizes understanding of plan of care.  Vitals:   03/18/21 1841  BP: (!) 170/68  Pulse: 65  Resp: 18  Temp: 98.5 F (36.9 C)  SpO2: 96%

## 2021-03-19 DIAGNOSIS — I10 Essential (primary) hypertension: Secondary | ICD-10-CM | POA: Diagnosis present

## 2021-03-19 DIAGNOSIS — J690 Pneumonitis due to inhalation of food and vomit: Secondary | ICD-10-CM | POA: Diagnosis present

## 2021-03-19 DIAGNOSIS — U071 COVID-19: Secondary | ICD-10-CM | POA: Diagnosis present

## 2021-03-19 DIAGNOSIS — Z9049 Acquired absence of other specified parts of digestive tract: Secondary | ICD-10-CM | POA: Diagnosis not present

## 2021-03-19 DIAGNOSIS — Z79899 Other long term (current) drug therapy: Secondary | ICD-10-CM | POA: Diagnosis not present

## 2021-03-19 DIAGNOSIS — K625 Hemorrhage of anus and rectum: Secondary | ICD-10-CM | POA: Diagnosis present

## 2021-03-19 DIAGNOSIS — J811 Chronic pulmonary edema: Secondary | ICD-10-CM | POA: Diagnosis not present

## 2021-03-19 DIAGNOSIS — I739 Peripheral vascular disease, unspecified: Secondary | ICD-10-CM | POA: Diagnosis present

## 2021-03-19 DIAGNOSIS — J9601 Acute respiratory failure with hypoxia: Secondary | ICD-10-CM | POA: Diagnosis not present

## 2021-03-19 DIAGNOSIS — F411 Generalized anxiety disorder: Secondary | ICD-10-CM | POA: Diagnosis present

## 2021-03-19 DIAGNOSIS — K529 Noninfective gastroenteritis and colitis, unspecified: Secondary | ICD-10-CM | POA: Diagnosis not present

## 2021-03-19 DIAGNOSIS — K640 First degree hemorrhoids: Secondary | ICD-10-CM | POA: Diagnosis present

## 2021-03-19 DIAGNOSIS — I2510 Atherosclerotic heart disease of native coronary artery without angina pectoris: Secondary | ICD-10-CM | POA: Diagnosis present

## 2021-03-19 DIAGNOSIS — K219 Gastro-esophageal reflux disease without esophagitis: Secondary | ICD-10-CM | POA: Diagnosis present

## 2021-03-19 DIAGNOSIS — F419 Anxiety disorder, unspecified: Secondary | ICD-10-CM | POA: Diagnosis present

## 2021-03-19 DIAGNOSIS — Z7982 Long term (current) use of aspirin: Secondary | ICD-10-CM | POA: Diagnosis not present

## 2021-03-19 DIAGNOSIS — R0602 Shortness of breath: Secondary | ICD-10-CM | POA: Diagnosis not present

## 2021-03-19 DIAGNOSIS — E785 Hyperlipidemia, unspecified: Secondary | ICD-10-CM | POA: Diagnosis present

## 2021-03-19 DIAGNOSIS — K5730 Diverticulosis of large intestine without perforation or abscess without bleeding: Secondary | ICD-10-CM | POA: Diagnosis present

## 2021-03-19 DIAGNOSIS — Z90710 Acquired absence of both cervix and uterus: Secondary | ICD-10-CM | POA: Diagnosis not present

## 2021-03-19 DIAGNOSIS — R7303 Prediabetes: Secondary | ICD-10-CM | POA: Diagnosis present

## 2021-03-19 DIAGNOSIS — K559 Vascular disorder of intestine, unspecified: Secondary | ICD-10-CM | POA: Diagnosis present

## 2021-03-19 HISTORY — PX: COLONOSCOPY: SHX174

## 2021-03-19 LAB — CBC
HCT: 33.4 % — ABNORMAL LOW (ref 36.0–46.0)
Hemoglobin: 11 g/dL — ABNORMAL LOW (ref 12.0–15.0)
MCH: 28 pg (ref 26.0–34.0)
MCHC: 32.9 g/dL (ref 30.0–36.0)
MCV: 85 fL (ref 80.0–100.0)
Platelets: 120 10*3/uL — ABNORMAL LOW (ref 150–400)
RBC: 3.93 MIL/uL (ref 3.87–5.11)
RDW: 14.6 % (ref 11.5–15.5)
WBC: 11.9 10*3/uL — ABNORMAL HIGH (ref 4.0–10.5)
nRBC: 0 % (ref 0.0–0.2)

## 2021-03-19 LAB — TYPE AND SCREEN
ABO/RH(D): O POS
Antibody Screen: NEGATIVE

## 2021-03-19 LAB — HEMOGLOBIN AND HEMATOCRIT, BLOOD
HCT: 33.1 % — ABNORMAL LOW (ref 36.0–46.0)
HCT: 33.5 % — ABNORMAL LOW (ref 36.0–46.0)
Hemoglobin: 10.8 g/dL — ABNORMAL LOW (ref 12.0–15.0)
Hemoglobin: 10.9 g/dL — ABNORMAL LOW (ref 12.0–15.0)

## 2021-03-19 LAB — LACTIC ACID, PLASMA: Lactic Acid, Venous: 2.1 mmol/L (ref 0.5–1.9)

## 2021-03-19 LAB — BASIC METABOLIC PANEL
Anion gap: 7 (ref 5–15)
BUN: 16 mg/dL (ref 8–23)
CO2: 26 mmol/L (ref 22–32)
Calcium: 8.6 mg/dL — ABNORMAL LOW (ref 8.9–10.3)
Chloride: 104 mmol/L (ref 98–111)
Creatinine, Ser: 0.75 mg/dL (ref 0.44–1.00)
GFR, Estimated: >60 mL/min (ref >60)
Glucose, Bld: 106 mg/dL — ABNORMAL HIGH (ref 70–99)
Potassium: 3.6 mmol/L (ref 3.5–5.1)
Sodium: 137 mmol/L (ref 135–145)

## 2021-03-19 LAB — RESP PANEL BY RT-PCR (FLU A&B, COVID) ARPGX2
Influenza A by PCR: NEGATIVE
Influenza B by PCR: NEGATIVE
SARS Coronavirus 2 by RT PCR: POSITIVE — AB

## 2021-03-19 MED ORDER — SODIUM CHLORIDE 0.9 % IV SOLN
1.0000 g | Freq: Two times a day (BID) | INTRAVENOUS | Status: DC
  Administered 2021-03-19 (×2): 1 g via INTRAVENOUS
  Filled 2021-03-19 (×2): qty 1

## 2021-03-19 MED ORDER — SODIUM CHLORIDE 0.9 % IV SOLN: INTRAVENOUS | Status: DC

## 2021-03-19 MED ORDER — LOSARTAN POTASSIUM 50 MG PO TABS
50.0000 mg | ORAL_TABLET | Freq: Every day | ORAL | Status: DC
  Filled 2021-03-19: qty 1

## 2021-03-19 MED ORDER — SODIUM CHLORIDE 0.9 % IV SOLN: 100.0000 mg | Freq: Every day | INTRAVENOUS | Status: DC

## 2021-03-19 MED ORDER — ONDANSETRON HCL 4 MG PO TABS: 4.0000 mg | ORAL_TABLET | Freq: Four times a day (QID) | ORAL | Status: DC | PRN

## 2021-03-19 MED ORDER — ALPRAZOLAM 0.5 MG PO TABS
0.5000 mg | ORAL_TABLET | Freq: Two times a day (BID) | ORAL | Status: DC | PRN
  Administered 2021-03-19 – 2021-03-20 (×4): 0.5 mg via ORAL
  Filled 2021-03-19 (×4): qty 1

## 2021-03-19 MED ORDER — ATENOLOL 25 MG PO TABS
25.0000 mg | ORAL_TABLET | Freq: Two times a day (BID) | ORAL | Status: DC
  Administered 2021-03-19 – 2021-03-21 (×6): 25 mg via ORAL
  Filled 2021-03-19 (×7): qty 1

## 2021-03-19 MED ORDER — ACETAMINOPHEN 325 MG PO TABS
ORAL_TABLET | ORAL | Status: AC
  Administered 2021-03-19: 650 mg via ORAL
  Filled 2021-03-19: qty 2

## 2021-03-19 MED ORDER — SODIUM CHLORIDE 0.9 % IV SOLN
1.0000 g | INTRAVENOUS | Status: DC
  Administered 2021-03-19: 1 g via INTRAVENOUS
  Filled 2021-03-19 (×2): qty 10

## 2021-03-19 MED ORDER — SODIUM CHLORIDE 0.9 % IV SOLN
200.0000 mg | Freq: Once | INTRAVENOUS | Status: DC
  Filled 2021-03-19: qty 40

## 2021-03-19 MED ORDER — ACETAMINOPHEN 325 MG PO TABS
650.0000 mg | ORAL_TABLET | Freq: Four times a day (QID) | ORAL | Status: DC | PRN
  Administered 2021-03-20: 650 mg via ORAL
  Filled 2021-03-19: qty 2

## 2021-03-19 MED ORDER — LACTATED RINGERS IV SOLN: INTRAVENOUS | Status: AC

## 2021-03-19 MED ORDER — PEG 3350-KCL-NA BICARB-NACL 420 G PO SOLR
4000.0000 mL | Freq: Once | ORAL | Status: AC
  Administered 2021-03-19: 4000 mL via ORAL
  Filled 2021-03-19: qty 4000

## 2021-03-19 MED ORDER — SODIUM CHLORIDE 0.9 % IV SOLN
200.0000 mg | Freq: Once | INTRAVENOUS | Status: DC
  Filled 2021-03-19 (×2): qty 40

## 2021-03-19 MED ORDER — ATORVASTATIN CALCIUM 10 MG PO TABS
10.0000 mg | ORAL_TABLET | Freq: Every day | ORAL | Status: DC
  Administered 2021-03-19 – 2021-03-21 (×3): 10 mg via ORAL
  Filled 2021-03-19 (×4): qty 1

## 2021-03-19 MED ORDER — ONDANSETRON HCL 4 MG/2ML IJ SOLN
4.0000 mg | Freq: Four times a day (QID) | INTRAMUSCULAR | Status: DC | PRN
  Administered 2021-03-19: 4 mg via INTRAVENOUS
  Filled 2021-03-19: qty 2

## 2021-03-19 NOTE — Progress Notes (Signed)
PROGRESS NOTE    Bianca Fry  EUM:[id number] DOB: Sep 29, 1946 DOA: 03/18/2021 PCP: Ria Bush, MD   Brief Narrative: 75 year old with past medical history significant for PAD, hypertension, hyperlipidemia, IBS, recurrent UTI on prophylactic Keflex chronically, depression anxiety who presents to the ED complaining of abdominal pain diarrhea and bloody stool.  Patient symptoms a started on 5/31.  She reported nausea without emesis. She initially went to the urgent care CDF and GI pathogen were collected and resulted negative.  She was send to the ED due to rectal bleeding Her sister [place] PCR was positive.  She had COVID recently May 2.  She is currently denies any upper respiratory symptoms.  Assessment & Plan:   Principal Problem:   Colitis Active Problems:   HLD (hyperlipidemia)   Essential hypertension   PAD (peripheral artery disease) (HCC)  1-Acute colitis with rectal bleeding: -Patient presented with abdominal pain, diarrhea and bloody stool.  C. difficile panel negative. -Hemoglobin initially at 12 decreased to 11. -Continue with IV ceftriaxone. -IV fluids -Hold aspirin and monitor hemoglobin -GI has been consulted.  2-HTN;  Continue with atenolol and losartan.   3-HLD;  Continue with atorvastatin.   4-PAD; hold aspirin.   5-Covid 19, positive:  Patient had covid recently 5-02. Her test is positive probably related to recent infection. No UR symptoms.  Plan to hold  Remdesivir.    Estimated body mass index is 25.58 kg/m as calculated from the following:   Height as of this encounter: 5\' 4"  (1.626 m).   Weight as of this encounter: 67.6 kg.   DVT prophylaxis: SCD Code Status: Full code Family Communication: Disposition Plan:  Status is: Observation  The patient will require care spanning > 2 midnights and should be moved to inpatient because: IV treatments appropriate due to intensity of illness or inability to take PO  Dispo: The patient is from:  Home              Anticipated d/c is to: Home              Patient currently is not medically stable to d/c.   Difficult to place patient No        Consultants:   GI  Procedures:   None  Antimicrobials:    Subjective: She report bloody stool and abdominal pain   Objective: Vitals:   03/19/21 0530 03/19/21 0704 03/19/21 0730 03/19/21 0830  BP: (!) 145/54  (!) 134/59 (!) 149/55  Pulse: 65  63 64  Resp: 20  17 18   Temp:      TempSrc:      SpO2: 96% 98% 97% 96%  Weight:      Height:       No intake or output data in the 24 hours ending 03/19/21 0939 Filed Weights   03/18/21 2115  Weight: 67.6 kg    Examination:  General exam: Appears calm and comfortable  Respiratory system: Clear to auscultation. Respiratory effort normal. Cardiovascular system: S1 & S2 heard, RRR. No JVD, murmurs, rubs, gallops or clicks. No pedal edema. Gastrointestinal system: Abdomen is non disteneded, mild tender.  Central nervous system: Alert and oriented. No focal neurological deficits. Extremities: Symmetric 5 x 5 power. Skin: No rashes, lesions or ulcers   Data Reviewed: I have personally reviewed following labs and imaging studies  CBC: Recent Labs  Lab 03/18/21 2133 03/19/21 0508  WBC 13.7* 11.9*  HGB 12.7 11.0*  HCT 39.0 33.4*  MCV 85.9 85.0  PLT 137* 120*  Basic Metabolic Panel: Recent Labs  Lab 03/18/21 2133 03/19/21 0508  NA 137 137  K 4.0 3.6  CL 101 104  CO2 24 26  GLUCOSE 114* 106*  BUN 18 16  CREATININE 0.91 0.75  CALCIUM 9.4 8.6*   GFR: Estimated Creatinine Clearance: 56.6 mL/min (by C-G formula based on SCr of 0.75 mg/dL). Liver Function Tests: Recent Labs  Lab 03/18/21 2133  AST 20  ALT 14  ALKPHOS 93  BILITOT 0.8  PROT 7.5  ALBUMIN 4.2   No results for input(s): LIPASE, AMYLASE in the last 168 hours. No results for input(s): AMMONIA in the last 168 hours. Coagulation Profile: No results for input(s): INR, PROTIME in the last 168  hours. Cardiac Enzymes: No results for input(s): CKTOTAL, CKMB, CKMBINDEX, TROPONINI in the last 168 hours. BNP (last 3 results) No results for input(s): PROBNP in the last 8760 hours. HbA1C: No results for input(s): HGBA1C in the last 72 hours. CBG: No results for input(s): GLUCAP in the last 168 hours. Lipid Profile: No results for input(s): CHOL, HDL, LDLCALC, TRIG, CHOLHDL, LDLDIRECT in the last 72 hours. Thyroid Function Tests: No results for input(s): TSH, T4TOTAL, FREET4, T3FREE, THYROIDAB in the last 72 hours. Anemia Panel: No results for input(s): VITAMINB12, FOLATE, FERRITIN, TIBC, IRON, RETICCTPCT in the last 72 hours. Sepsis Labs: Recent Labs  Lab 03/17/21 2335 03/18/21 2236  LATICACIDVEN 2.1* 1.9    Recent Results (from the past 240 hour(s))  Gastrointestinal Panel by PCR , Stool     Status: None   Collection Time: 03/18/21  7:08 PM   Specimen: Stool  Result Value Ref Range Status   Campylobacter species NOT DETECTED NOT DETECTED Final   Plesimonas shigelloides NOT DETECTED NOT DETECTED Final   Salmonella species NOT DETECTED NOT DETECTED Final   Yersinia enterocolitica NOT DETECTED NOT DETECTED Final   Vibrio species NOT DETECTED NOT DETECTED Final   Vibrio cholerae NOT DETECTED NOT DETECTED Final   Enteroaggregative E coli (EAEC) NOT DETECTED NOT DETECTED Final   Enteropathogenic E coli (EPEC) NOT DETECTED NOT DETECTED Final   Enterotoxigenic E coli (ETEC) NOT DETECTED NOT DETECTED Final   Shiga like toxin producing E coli (STEC) NOT DETECTED NOT DETECTED Final   Shigella/Enteroinvasive E coli (EIEC) NOT DETECTED NOT DETECTED Final   Cryptosporidium NOT DETECTED NOT DETECTED Final   Cyclospora cayetanensis NOT DETECTED NOT DETECTED Final   Entamoeba histolytica NOT DETECTED NOT DETECTED Final   Giardia lamblia NOT DETECTED NOT DETECTED Final   Adenovirus F40/41 NOT DETECTED NOT DETECTED Final   Astrovirus NOT DETECTED NOT DETECTED Final   Norovirus GI/GII  NOT DETECTED NOT DETECTED Final   Rotavirus A NOT DETECTED NOT DETECTED Final   Sapovirus (I, II, IV, and V) NOT DETECTED NOT DETECTED Final    Comment: Performed at [facility], [address] [place], [place] [address]  C Difficile Quick Screen w PCR reflex     Status: None   Collection Time: 03/18/21  7:08 PM   Specimen: Stool  Result Value Ref Range Status   C Diff antigen NEGATIVE NEGATIVE Final   C Diff toxin NEGATIVE NEGATIVE Final   C Diff interpretation No C. difficile detected.  Final    Comment: Performed at [facility], [address] [place], [place] [address]  Resp Panel by RT-PCR (Flu A&B, Covid) Nasopharyngeal Swab     Status: Abnormal   Collection Time: 03/18/21 11:36 PM   Specimen: Nasopharyngeal Swab; Nasopharyngeal(NP) swabs in  vial transport medium  Result Value Ref Range Status   SARS Coronavirus 2 by RT PCR POSITIVE (A) NEGATIVE Final    Comment: RESULT CALLED TO, READ BACK BY AND VERIFIED WITH: Mimi Faggart @0056  on 03/19/21 skl (NOTE) SARS-CoV-2 target nucleic acids are DETECTED.  The SARS-CoV-2 RNA is generally detectable in upper respiratory specimens during the acute phase of infection. Positive results are indicative of the presence of the identified virus, but do not rule out bacterial infection or co-infection with other pathogens not detected by the test. Clinical correlation with patient history and other diagnostic information is necessary to determine patient infection status. The expected result is Negative.  Fact Sheet for Patients: EntrepreneurPulse.com.au  Fact Sheet for Healthcare Providers: IncredibleEmployment.be  This test is not yet approved or cleared by the [place] FDA and  has been authorized for detection and/or diagnosis of SARS-CoV-2 by FDA under an Emergency Use Authorization (EUA).  This EUA will remain in effect (meaning this test can be  used) for the duration  of  the COVID-19 declaration under Section 564(b)(1) of the Act, 21 U.S.C. section 360bbb-3(b)(1), unless the authorization is terminated or revoked sooner.     Influenza A by PCR NEGATIVE NEGATIVE Final   Influenza B by PCR NEGATIVE NEGATIVE Final    Comment: (NOTE) The Xpert Xpress SARS-CoV-2/FLU/RSV plus assay is intended as an aid in the diagnosis of influenza from Nasopharyngeal swab specimens and should not be used as a sole basis for treatment. Nasal washings and aspirates are unacceptable for Xpert Xpress SARS-CoV-2/FLU/RSV testing.  Fact Sheet for Patients: EntrepreneurPulse.com.au  Fact Sheet for Healthcare Providers: IncredibleEmployment.be  This test is not yet approved or cleared by the [place] FDA and has been authorized for detection and/or diagnosis of SARS-CoV-2 by FDA under an Emergency Use Authorization (EUA). This EUA will remain in effect (meaning this test can be used) for the duration of the COVID-19 declaration under Section 564(b)(1) of the Act, 21 U.S.C. section 360bbb-3(b)(1), unless the authorization is terminated or revoked.  Performed at [facility], [address], [place], [place] [address]          Radiology Studies: CT Angio Abd/Pel W and/or Wo Contrast  Result Date: 03/18/2021 CLINICAL DATA:  Lower abdominal pain, diarrhea, blood in stool EXAM: CTA ABDOMEN AND PELVIS WITHOUT AND WITH CONTRAST TECHNIQUE: Multidetector CT imaging of the abdomen and pelvis was performed using the standard protocol during bolus administration of intravenous contrast. Multiplanar reconstructed images and MIPs were obtained and reviewed to evaluate the vascular anatomy. CONTRAST:  167mL OMNIPAQUE IOHEXOL 350 MG/ML SOLN COMPARISON:  11/25/2020 FINDINGS: VASCULAR Aorta: Normal caliber aorta without aneurysm, dissection, vasculitis or significant stenosis. Mild diffuse atherosclerosis. Celiac: Patent without  evidence of aneurysm, dissection, vasculitis or significant stenosis. SMA: Patent without evidence of aneurysm, dissection, vasculitis or significant stenosis. Renals: Both renal arteries are patent without evidence of aneurysm, dissection, vasculitis, fibromuscular dysplasia or significant stenosis. Stable atherosclerosis at the origin of the left renal artery. IMA: Patent without evidence of aneurysm, dissection, vasculitis or significant stenosis. Inflow: Patent without evidence of aneurysm, dissection, vasculitis or significant stenosis. Proximal Outflow: Bilateral common femoral and visualized portions of the superficial and profunda femoral arteries are patent without evidence of aneurysm, dissection, vasculitis or significant stenosis. Veins: No obvious venous abnormality within the limitations of this arterial phase study. Review of the MIP images confirms the above findings. NON-VASCULAR Lower chest: No acute pleural or parenchymal lung disease. Hepatobiliary: Stable small hemangioma right lobe  liver. No other focal abnormalities. Gallbladder is surgically absent. Chronic pneumobilia likely related to prior instrumentation of the common bile duct. Pancreas: Unremarkable. No pancreatic ductal dilatation or surrounding inflammatory changes. Spleen: Normal in size without focal abnormality. Adrenals/Urinary Tract: Adrenal glands are unremarkable. Kidneys are normal, without renal calculi, focal lesion, or hydronephrosis. Bladder is unremarkable. Stomach/Bowel: There is segmental wall thickening of the colon from the splenic flexure through the rectosigmoid junction, consistent with inflammatory or infectious colitis. While there is hyperemia of the mucosa throughout the segment of colonic wall thickening, I do not see any intraluminal contrast accumulation to suggest active gastrointestinal hemorrhage. Likely surgical clip within the mid sigmoid colon reference image 73/2, please correlate with previous  endoscopic procedures. Numerous diverticula are seen throughout the descending and sigmoid colon, but the diverticula do not appear inflamed. No bowel obstruction or ileus. Lymphatic: No pathologic adenopathy within the abdomen or pelvis. Reproductive: Status post hysterectomy. No adnexal masses. Other: No free fluid or free gas.  No abdominal wall hernia. Musculoskeletal: No acute or destructive bony lesions. Postsurgical changes in the lower lumbar spine. Reconstructed images demonstrate no additional findings. IMPRESSION: VASCULAR 1. No evidence of active gastrointestinal hemorrhage. 2.  Aortic Atherosclerosis (ICD10-I70.0). NON-VASCULAR 1. Segmental colonic wall thickening extending from the splenic flexure through the rectosigmoid junction. While there is diffuse diverticulosis throughout this region of the colon, the appearance is more suggestive of inflammatory/infectious colitis rather than diverticulitis. 2. Metallic density within the mid sigmoid colon, likely representing endoscopic clip. 3. Chronic pneumobilia likely representing prior common bile duct instrumentation or sphincter incompetence. Electronically Signed   By: Randa Ngo M.D.   On: 03/18/2021 23:12        Scheduled Meds: . atenolol  25 mg Oral BID  . atorvastatin  10 mg Oral Daily   Continuous Infusions: . lactated ringers 100 mL/hr at 03/19/21 0644  . meropenem (MERREM) IV 1 g (03/19/21 0933)  . remdesivir 200 mg in sodium chloride 0.9% 250 mL IVPB     Followed by  . [START ON 03/20/2021] remdesivir 100 mg in NS 100 mL       LOS: 0 days    Time spent: 35 minutes    Lutricia Widjaja A Daishawn Lauf, MD [facility]   If 7PM-7AM, please contact night-coverage [email or web address]  03/19/2021, 9:39 AM

## 2021-03-19 NOTE — ED Notes (Signed)
This tech assisted pt to get up to the bedside commode. Pt was able to get up w/ minimal assist.

## 2021-03-19 NOTE — ED Notes (Signed)
Pt had watery and bright red stool with some pellets of formed stool

## 2021-03-19 NOTE — ED Notes (Signed)
Pt had another watery bloody stool

## 2021-03-19 NOTE — ED Notes (Signed)
Pt placed on bedpan, able to lift hips.

## 2021-03-19 NOTE — ED Notes (Signed)
Pt assisted to bedpan, pt had urinated in the bedpan and also had blood present from liquid BM. Pt reports some LLQ abd pain rates 3/10.

## 2021-03-19 NOTE — Telephone Encounter (Signed)
Spoke with pt's husband, Jeneen Rinks (on dpr), asking for update on pt.  States she is currently at [facility] ED.  States they started at [facility] yesterday, then sent to [facility] ED.  Jeneen Rinks states pt was tested for COVID, pos results.  So they are going to give her COVID med this AM.

## 2021-03-19 NOTE — ED Notes (Signed)
Called and asked for bedpan.  Says she had diarrhea.

## 2021-03-19 NOTE — ED Notes (Signed)
Patient was given Nulytely at 1700 today. Upon assuming care of patient patient had drank approx 1/2 gallon. Per pharmacy patient should not start until 1100 tomorrow since she is scheduled for colonoscopy at 1500.   Patient aware. Stopped drinking at this time.   Patient has had 3 small bowel movements in past hour.

## 2021-03-19 NOTE — ED Notes (Signed)
Pt placed on bedpan. Clear liquid noted with some blood. Pt cleansed with wipe. Verbalized comfort. Denies further needs.

## 2021-03-19 NOTE — Progress Notes (Signed)
Pharmacy Antibiotic Note  Bianca Fry is a 75 y.o. female admitted on 03/18/2021 with intra-abdominal infection: Colitis versus diverticulitis, C. difficile negative.  Multiple reported antibiotic intolerances..  Pharmacy has been consulted for Meropenem dosing.  Plan: Ordered Meropenem 1 gm q12h per indication and current renal function.  Pharmacy will continue to follow and adjust abx dosing if warranted.  Height: 5\' 4"  (162.6 cm) Weight: 67.6 kg (149 lb) IBW/kg (Calculated) : 54.7  Temp (24hrs), Avg:98.2 F (36.8 C), Min:97.8 F (36.6 C), Max:98.5 F (36.9 C)  Recent Labs  Lab 03/17/21 2335 03/18/21 2133 03/18/21 2236  WBC  --  13.7*  --   CREATININE  --  0.91  --   LATICACIDVEN 2.1*  --  1.9    Estimated Creatinine Clearance: 49.7 mL/min (by C-G formula based on SCr of 0.91 mg/dL).    Allergies  Allergen Reactions  . Codeine Other (See Comments)    Chest pain  . Nitrofurantoin Other (See Comments)    Chest pain - hospitalization  . Percocet [Oxycodone-Acetaminophen] Nausea And Vomiting  . Propoxyphene Nausea And Vomiting  . Vicodin [Hydrocodone-Acetaminophen] Nausea And Vomiting  . Amlodipine Other (See Comments)    Pedal edema  . Boniva [Ibandronic Acid] Diarrhea    Severe watery diarrhea - led to ER visit  . Elmiron [Pentosan Polysulfate]     Chest pain, tremors, wheezing   . Flagyl [Metronidazole] Diarrhea and Nausea Only  . Fosamax [Alendronate] Other (See Comments)    R hip pain  . Hctz [Hydrochlorothiazide] Other (See Comments)    headache  . Vancomycin Diarrhea and Other (See Comments)    Severe abdominal pain  . Clarithromycin Other (See Comments)    Abd. cramps  . Penicillins Rash  . Prednisone Other (See Comments)    Caused UTI  . Sulfonamide Derivatives Other (See Comments)    GI upset    Antimicrobials this admission: 6/01 Meropenem >>   Microbiology results: 5/31 UCx: Pending  5/31 GI Panel: Pending   Thank you for allowing pharmacy  to be a part of this patient's care.  Renda Rolls, PharmD, [facility] 03/19/2021 1:56 AM

## 2021-03-19 NOTE — ED Notes (Signed)
Patient noted to have 3 small bowel movements over past hours. Assisted with changing gown and brief.

## 2021-03-19 NOTE — ED Notes (Signed)
GI at bedside

## 2021-03-19 NOTE — Progress Notes (Signed)
Remdesivir - Pharmacy Brief Note   O: CXR: "No acute pleural or parenchymal lung disease." SpO2: 96-97% on RA   A/P:  Remdesivir 200 mg IVPB once followed by 100 mg IVPB daily x 4 days.   1st dose scheduled for 1000 this AM due to [place] pharmacies currently being out of stock.  Expecting more medication to arrive in morning with daily order.  Renda Rolls, PharmD, MBA 03/19/2021 1:23 AM
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
## 2021-03-19 NOTE — ED Notes (Signed)
Informed RN bed assigned 

## 2021-03-19 NOTE — ED Notes (Signed)
Pt placed on bedpan, produced watery bloody stool with small fragments of formed stool. Bedpan removed and purewick placed.

## 2021-03-19 NOTE — ED Notes (Signed)
Pt assisted on and off the bedpan, pt had very small amount of formed stool, the rest was a moderate amount of bloody liquid. Pt denies any pain at this time. New purewick placed as well due to previous one stained with blood.

## 2021-03-19 NOTE — ED Notes (Signed)
Pt resting quietly at this time, respirations equal and unlabored. Waiting for bed assignment. Will continue to monitor.

## 2021-03-19 NOTE — ED Notes (Signed)
Pt had has a total of 5x liquid w/ some solid stool.

## 2021-03-19 NOTE — ED Notes (Signed)
Pt in bed and is comfortable. Pt is not c/o of anything and "wants to rest"

## 2021-03-19 NOTE — ED Notes (Signed)
Draw H&H at 12:40
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
## 2021-03-19 NOTE — ED Notes (Signed)
x1 attempt for lab draw unsuccessful, Raquel RN to look for site to draw AM labs. Pt refusing to allow RN to stick in hand for lab draw.

## 2021-03-20 ENCOUNTER — Encounter: Payer: Self-pay | Admitting: Internal Medicine

## 2021-03-20 ENCOUNTER — Inpatient Hospital Stay: Payer: PPO | Admitting: Anesthesiology

## 2021-03-20 ENCOUNTER — Inpatient Hospital Stay: Payer: PPO

## 2021-03-20 ENCOUNTER — Encounter
Admission: EM | Disposition: A | Discharge status: Home / Self Care | Payer: Self-pay | Source: Home / Self Care | Attending: Internal Medicine

## 2021-03-20 DIAGNOSIS — K640 First degree hemorrhoids: Secondary | ICD-10-CM | POA: Diagnosis not present

## 2021-03-20 DIAGNOSIS — K551 Chronic vascular disorders of intestine: Secondary | ICD-10-CM | POA: Diagnosis not present

## 2021-03-20 DIAGNOSIS — K5730 Diverticulosis of large intestine without perforation or abscess without bleeding: Secondary | ICD-10-CM | POA: Diagnosis not present

## 2021-03-20 HISTORY — PX: COLONOSCOPY: SHX5424

## 2021-03-20 LAB — HEMOGLOBIN AND HEMATOCRIT, BLOOD
HCT: 32.2 % — ABNORMAL LOW (ref 36.0–46.0)
HCT: 31 % — ABNORMAL LOW (ref 36.0–46.0)
Hemoglobin: 10.3 g/dL — ABNORMAL LOW (ref 12.0–15.0)
Hemoglobin: 10 g/dL — ABNORMAL LOW (ref 12.0–15.0)

## 2021-03-20 SURGERY — COLONOSCOPY
Anesthesia: General

## 2021-03-20 MED ORDER — GUAIFENESIN ER 600 MG PO TB12
600.0000 mg | ORAL_TABLET | Freq: Two times a day (BID) | ORAL | Status: DC
  Administered 2021-03-20 – 2021-03-21 (×2): 600 mg via ORAL
  Filled 2021-03-20 (×2): qty 1

## 2021-03-20 MED ORDER — ALBUTEROL SULFATE (2.5 MG/3ML) 0.083% IN NEBU: 2.5000 mg | INHALATION_SOLUTION | Freq: Four times a day (QID) | RESPIRATORY_TRACT | Status: DC

## 2021-03-20 MED ORDER — IPRATROPIUM-ALBUTEROL 0.5-2.5 (3) MG/3ML IN SOLN
3.0000 mL | Freq: Once | RESPIRATORY_TRACT | Status: AC
  Administered 2021-03-20: 3 mL via RESPIRATORY_TRACT

## 2021-03-20 MED ORDER — LIDOCAINE HCL (CARDIAC) PF 100 MG/5ML IV SOSY
PREFILLED_SYRINGE | INTRAVENOUS | Status: DC | PRN
  Administered 2021-03-20: 40 mg via INTRAVENOUS

## 2021-03-20 MED ORDER — PROPOFOL 500 MG/50ML IV EMUL
INTRAVENOUS | Status: DC | PRN
  Administered 2021-03-20: 150 ug/kg/min via INTRAVENOUS

## 2021-03-20 MED ORDER — FUROSEMIDE 10 MG/ML IJ SOLN: 40.0000 mg | Freq: Once | INTRAMUSCULAR | Status: DC

## 2021-03-20 MED ORDER — ALBUTEROL SULFATE HFA 108 (90 BASE) MCG/ACT IN AERS
1.0000 | INHALATION_SPRAY | Freq: Four times a day (QID) | RESPIRATORY_TRACT | Status: DC
  Administered 2021-03-20 – 2021-03-21 (×3): 1 via RESPIRATORY_TRACT
  Filled 2021-03-20: qty 6.7

## 2021-03-20 MED ORDER — SODIUM CHLORIDE 0.9 % IV SOLN
1.0000 g | INTRAVENOUS | Status: DC
  Administered 2021-03-20: 1 g via INTRAVENOUS
  Filled 2021-03-20: qty 1
  Filled 2021-03-20: qty 10

## 2021-03-20 MED ORDER — SODIUM CHLORIDE 0.9 % IV SOLN
2.0000 g | INTRAVENOUS | Status: DC
  Filled 2021-03-20: qty 20

## 2021-03-20 MED ORDER — PANTOPRAZOLE SODIUM 40 MG IV SOLR
40.0000 mg | Freq: Two times a day (BID) | INTRAVENOUS | Status: DC
  Administered 2021-03-20 – 2021-03-21 (×2): 40 mg via INTRAVENOUS
  Filled 2021-03-20 (×2): qty 40

## 2021-03-20 MED ORDER — POTASSIUM CHLORIDE CRYS ER 20 MEQ PO TBCR
40.0000 meq | EXTENDED_RELEASE_TABLET | Freq: Once | ORAL | Status: AC
  Administered 2021-03-20: 40 meq via ORAL
  Filled 2021-03-20: qty 2

## 2021-03-20 MED ORDER — PROPOFOL 500 MG/50ML IV EMUL
INTRAVENOUS | Status: AC
  Filled 2021-03-20: qty 50

## 2021-03-20 MED ORDER — PROPOFOL 10 MG/ML IV BOLUS
INTRAVENOUS | Status: DC | PRN
  Administered 2021-03-20: 50 mg via INTRAVENOUS

## 2021-03-20 MED ORDER — SODIUM CHLORIDE 0.9 % IV SOLN: INTRAVENOUS | Status: DC

## 2021-03-20 NOTE — Op Note (Signed)
[facility] Patient Name: Bianca Fry Procedure Date: 03/20/2021 12:46 PM MRN: [id number] Account #: [id number] Date of Birth: July 03, 1946 Admit Type: Inpatient Age: 75 Room: [facility] ENDO ROOM 4 Gender: Female Note Status: Finalized Procedure:             Colonoscopy Indications:           Abdominal pain in the left lower quadrant, Diarrhea                         (presumed secondary to ischemic colitis), Hematochezia Providers:             Benay Pike. Alice Reichert MD, MD Referring MD:          No Local Md, MD (Referring MD) Medicines:             Propofol per Anesthesia Complications:         No immediate complications. Procedure:             Pre-Anesthesia Assessment:                        - The risks and benefits of the procedure and the                         sedation options and risks were discussed with the                         patient. All questions were answered and informed                         consent was obtained.                        - Patient identification and proposed procedure were                         verified prior to the procedure by the nurse. The                         procedure was verified in the procedure room.                        - ASA Grade Assessment: III - A patient with severe                         systemic disease.                        - After reviewing the risks and benefits, the patient                         was deemed in satisfactory condition to undergo the                         procedure.                        After obtaining informed consent, the colonoscope was                         passed under  direct vision. Throughout the procedure,                         the patient's blood pressure, pulse, and oxygen                         saturations were monitored continuously. The                         Colonoscope was introduced through the anus and                         advanced to the the cecum,  identified by appendiceal                         orifice and ileocecal valve. The colonoscopy was                         somewhat difficult due to significant looping.                         Successful completion of the procedure was aided by                         applying abdominal pressure. Findings:      The perianal and digital rectal examinations were normal. Pertinent       negatives include normal sphincter tone and no palpable rectal lesions.      A few medium-mouthed diverticula were found in the sigmoid colon.      A continuous area of nonbleeding ulcerated mucosa with no stigmata of       recent bleeding was present in the sigmoid colon. Biopsies were taken       with a cold forceps for histology.      Non-bleeding internal hemorrhoids were found during retroflexion. The       hemorrhoids were Grade I (internal hemorrhoids that do not prolapse).      The exam was otherwise without abnormality. Impression:            - Diverticulosis in the sigmoid colon.                        - Mucosal ulceration. Biopsied.                        - Non-bleeding internal hemorrhoids.                        - The examination was otherwise normal. Recommendation:        - Return patient to hospital ward for ongoing care.                        - Mechanical soft diet.                        - Continue present medications.                        - Await pathology results.                        -  Discharge home within 24 hours if no further                         significant bleeding, abdominal pain, or drop in Hgb.                        - No further surveillance colonoscopy or colon cancer                         screening secondary to advanced age and absence of                         polyps on this examination.                        - [employer] will sign off for now. Call us back if any                         changes clinically or if we can help for any reason. Procedure Code(s):     ---  Professional ---                        [phone], Colonoscopy, flexible; with biopsy, single or                         multiple Diagnosis Code(s):     --- Professional ---                        K57.30, Diverticulosis of large intestine without                         perforation or abscess without bleeding                        K92.1, Melena (includes Hematochezia)                        R19.7, Diarrhea, unspecified                        R10.32, Left lower quadrant pain                        K63.3, Ulcer of intestine                        K64.0, First degree hemorrhoids CPT copyright 2019 American Medical Association. All rights reserved. The codes documented in this report are preliminary and upon coder review may  be revised to meet current compliance requirements. Efrain Sella MD, MD 03/20/2021 1:11:25 PM This report has been signed electronically. Number of Addenda: 0 Note Initiated On: 03/20/2021 12:46 PM Scope Withdrawal Time: 0 hours 3 minutes 34 seconds  Total Procedure Duration: 0 hours 12 minutes 6 seconds  Estimated Blood Loss:  Estimated blood loss: none.      [facility]

## 2021-03-20 NOTE — Interval H&P Note (Signed)
History and Physical Interval Note:  03/20/2021 12:44 PM  Bianca Fry  has presented today for surgery, with the diagnosis of Ischemic colitis, left-sided colitis, hematochezia.  The various methods of treatment have been discussed with the patient and family. After consideration of risks, benefits and other options for treatment, the patient has consented to  Procedure(s): COLONOSCOPY (N/A) as a surgical intervention.  The patient's history has been reviewed, patient examined, no change in status, stable for surgery.  I have reviewed the patient's chart and labs.  Questions were answered to the patient's satisfaction.     [place], [place]

## 2021-03-20 NOTE — Anesthesia Procedure Notes (Signed)
Procedure Name: MAC Date/Time: 03/20/2021 12:49 PM Performed by: Jerrye Noble, CRNA Pre-anesthesia Checklist: Patient identified, Emergency Drugs available, Suction available and Patient being monitored Patient Re-evaluated:Patient Re-evaluated prior to induction Oxygen Delivery Method: Nasal cannula

## 2021-03-20 NOTE — Progress Notes (Signed)
Ambulated patient in room without oxygen and she was sating at 96%. Tolerated ambulation well with no complaints of shortness of breath.  Will continue to monitor on room air. [name]
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
## 2021-03-20 NOTE — Progress Notes (Addendum)
PROGRESS NOTE    Bianca Fry  QMV:[id number] DOB: April 25, 1946 DOA: 03/18/2021 PCP: Ria Bush, MD   Brief Narrative: 75 year old with past medical history significant for PAD, hypertension, hyperlipidemia, IBS, recurrent UTI on prophylactic Keflex chronically, depression anxiety who presents to the ED complaining of abdominal pain diarrhea and bloody stool.  Patient symptoms a started on 5/31.  She reported nausea without emesis. She initially went to the urgent care CDF and GI pathogen were collected and resulted negative.  She was send to the ED due to rectal bleeding Her sister [place] PCR was positive.  She had COVID recently May 2.  She is currently denies any upper respiratory symptoms.  Assessment & Plan:   Principal Problem:   Colitis Active Problems:   HLD (hyperlipidemia)   Essential hypertension   PAD (peripheral artery disease) (HCC)  1-Acute colitis with rectal bleeding: -Patient presented with abdominal pain, diarrhea and bloody stool.  C. difficile panel negative. -Hemoglobin initially at 12 decreased to 11--10 -IV fluids -Hold aspirin and monitor hemoglobin -GI has been consulted. -Underwent colonoscopy 6/02; finding consistent with ischemic colitis. Biopsy pending.  -plan to observe patient overnight to monitor Hb. -plan to stop antibiotics.    2-HTN;  Continue with atenolol and losartan.   3-HLD;  Continue with atorvastatin.   4-PAD; hold aspirin.   5-Covid 19, positive:  Patient had covid recently 5-02. Her test is positive probably related to recent infection. No UR symptoms.  Plan to hold  Remdesivir.   Addendum;  Acute hypoxic respiratory failure:   Patient develops hypoxemia, SOB tremors. Had possible aspiration event after colonoscopy in endo.  Place on 2 L and is stable.  Plan to stop IV fluids. Check chest x ray.  Start nebulizer treatment.  Flutter valve.  Follow cbc.    Estimated body mass index is 25.62 kg/m as calculated from  the following:   Height as of this encounter: 5\' 4"  (1.626 m).   Weight as of this encounter: 67.7 kg.   DVT prophylaxis: SCD Code Status: Full code Family Communication: Patient updated, she politely decline that I update family.  Disposition Plan:  Status is: Observation  The patient will require care spanning > 2 midnights and should be moved to inpatient because: IV treatments appropriate due to intensity of illness or inability to take PO  Dispo: The patient is from: Home              Anticipated d/c is to: Home              Patient currently is not medically stable to d/c.   Difficult to place patient No        Consultants:   GI  Procedures:   None  Antimicrobials:    Subjective: She had multiples BM overnight after bowel prep.  She reported less blood in the stool. She report abdominal soreness.    Objective: Vitals:   03/19/21 2204 03/20/21 0252 03/20/21 1139 03/20/21 1312  BP:  (!) 141/53 (!) 178/69 (!) 109/49  Pulse:  64 74 73  Resp:  16  16  Temp:  98.1 F (36.7 C) 98.3 F (36.8 C)   TempSrc:  Oral    SpO2:  99% 100% 100%  Weight: 67.7 kg     Height:        Intake/Output Summary (Last 24 hours) at 03/20/2021 1316 Last data filed at 03/20/2021 0443 Gross per 24 hour  Intake 750.95 ml  Output --  Net 750.95 ml  Filed Weights   03/18/21 2115 03/19/21 2204  Weight: 67.6 kg 67.7 kg    Examination:  General exam: NAD Respiratory system: CTA Cardiovascular system:  S1, S 2 RRR Gastrointestinal system: BS present, soft, mild tender Central nervous system: Non focal.  Extremities: Symmetric power Skin: No rashes.    Data Reviewed: I have personally reviewed following labs and imaging studies  CBC: Recent Labs  Lab 03/18/21 2133 03/19/21 0508 03/19/21 1314 03/19/21 2212 03/20/21 0436  WBC 13.7* 11.9*  --   --   --   HGB 12.7 11.0* 10.8* 10.9* 10.3*  HCT 39.0 33.4* 33.1* 33.5* 32.2*  MCV 85.9 85.0  --   --   --   PLT 137* 120*   --   --   --    Basic Metabolic Panel: Recent Labs  Lab 03/18/21 2133 03/19/21 0508  NA 137 137  K 4.0 3.6  CL 101 104  CO2 24 26  GLUCOSE 114* 106*  BUN 18 16  CREATININE 0.91 0.75  CALCIUM 9.4 8.6*   GFR: Estimated Creatinine Clearance: 56.6 mL/min (by C-G formula based on SCr of 0.75 mg/dL). Liver Function Tests: Recent Labs  Lab 03/18/21 2133  AST 20  ALT 14  ALKPHOS 93  BILITOT 0.8  PROT 7.5  ALBUMIN 4.2   No results for input(s): LIPASE, AMYLASE in the last 168 hours. No results for input(s): AMMONIA in the last 168 hours. Coagulation Profile: No results for input(s): INR, PROTIME in the last 168 hours. Cardiac Enzymes: No results for input(s): CKTOTAL, CKMB, CKMBINDEX, TROPONINI in the last 168 hours. BNP (last 3 results) No results for input(s): PROBNP in the last 8760 hours. HbA1C: No results for input(s): HGBA1C in the last 72 hours. CBG: No results for input(s): GLUCAP in the last 168 hours. Lipid Profile: No results for input(s): CHOL, HDL, LDLCALC, TRIG, CHOLHDL, LDLDIRECT in the last 72 hours. Thyroid Function Tests: No results for input(s): TSH, T4TOTAL, FREET4, T3FREE, THYROIDAB in the last 72 hours. Anemia Panel: No results for input(s): VITAMINB12, FOLATE, FERRITIN, TIBC, IRON, RETICCTPCT in the last 72 hours. Sepsis Labs: Recent Labs  Lab 03/17/21 2335 03/18/21 2236  LATICACIDVEN 2.1* 1.9    Recent Results (from the past 240 hour(s))  Gastrointestinal Panel by PCR , Stool     Status: None   Collection Time: 03/18/21  7:08 PM   Specimen: Stool  Result Value Ref Range Status   Campylobacter species NOT DETECTED NOT DETECTED Final   Plesimonas shigelloides NOT DETECTED NOT DETECTED Final   Salmonella species NOT DETECTED NOT DETECTED Final   Yersinia enterocolitica NOT DETECTED NOT DETECTED Final   Vibrio species NOT DETECTED NOT DETECTED Final   Vibrio cholerae NOT DETECTED NOT DETECTED Final   Enteroaggregative E coli (EAEC) NOT  DETECTED NOT DETECTED Final   Enteropathogenic E coli (EPEC) NOT DETECTED NOT DETECTED Final   Enterotoxigenic E coli (ETEC) NOT DETECTED NOT DETECTED Final   Shiga like toxin producing E coli (STEC) NOT DETECTED NOT DETECTED Final   Shigella/Enteroinvasive E coli (EIEC) NOT DETECTED NOT DETECTED Final   Cryptosporidium NOT DETECTED NOT DETECTED Final   Cyclospora cayetanensis NOT DETECTED NOT DETECTED Final   Entamoeba histolytica NOT DETECTED NOT DETECTED Final   Giardia lamblia NOT DETECTED NOT DETECTED Final   Adenovirus F40/41 NOT DETECTED NOT DETECTED Final   Astrovirus NOT DETECTED NOT DETECTED Final   Norovirus GI/GII NOT DETECTED NOT DETECTED Final   Rotavirus A NOT DETECTED NOT DETECTED Final  Sapovirus (I, II, IV, and V) NOT DETECTED NOT DETECTED Final    Comment: Performed at [facility], [place], [place] [address]  C Difficile Quick Screen w PCR reflex     Status: None   Collection Time: 03/18/21  7:08 PM   Specimen: Stool  Result Value Ref Range Status   C Diff antigen NEGATIVE NEGATIVE Final   C Diff toxin NEGATIVE NEGATIVE Final   C Diff interpretation No C. difficile detected.  Final    Comment: Performed at [facility], [address], [place], [place] [address]  Urine culture     Status: Abnormal (Preliminary result)   Collection Time: 03/18/21  9:36 PM   Specimen: Urine, Random  Result Value Ref Range Status   Specimen Description   Final    URINE, RANDOM Performed at [facility], [address], [place], [place] [address]    Special Requests   Final    NONE Performed at [facility], [address], [place], [place] [address]    Culture (A)  Final    20,000 COLONIES/mL ESCHERICHIA COLI SUSCEPTIBILITIES TO FOLLOW Performed at [facility], [place] [address], [place], [place] [address]    Report Status PENDING  Incomplete  Resp Panel by RT-PCR (Flu A&B, Covid) Nasopharyngeal Swab     Status:  Abnormal   Collection Time: 03/18/21 11:36 PM   Specimen: Nasopharyngeal Swab; Nasopharyngeal(NP) swabs in vial transport medium  Result Value Ref Range Status   SARS Coronavirus 2 by RT PCR POSITIVE (A) NEGATIVE Final    Comment: RESULT CALLED TO, READ BACK BY AND VERIFIED WITH: Mimi Faggart @0056  on 03/19/21 skl (NOTE) SARS-CoV-2 target nucleic acids are DETECTED.  The SARS-CoV-2 RNA is generally detectable in upper respiratory specimens during the acute phase of infection. Positive results are indicative of the presence of the identified virus, but do not rule out bacterial infection or co-infection with other pathogens not detected by the test. Clinical correlation with patient history and other diagnostic information is necessary to determine patient infection status. The expected result is Negative.  Fact Sheet for Patients: EntrepreneurPulse.com.au  Fact Sheet for Healthcare Providers: IncredibleEmployment.be  This test is not yet approved or cleared by the [place] FDA and  has been authorized for detection and/or diagnosis of SARS-CoV-2 by FDA under an Emergency Use Authorization (EUA).  This EUA will remain in effect (meaning this test can be  used) for the duration of  the COVID-19 declaration under Section 564(b)(1) of the Act, 21 U.S.C. section 360bbb-3(b)(1), unless the authorization is terminated or revoked sooner.     Influenza A by PCR NEGATIVE NEGATIVE Final   Influenza B by PCR NEGATIVE NEGATIVE Final    Comment: (NOTE) The Xpert Xpress SARS-CoV-2/FLU/RSV plus assay is intended as an aid in the diagnosis of influenza from Nasopharyngeal swab specimens and should not be used as a sole basis for treatment. Nasal washings and aspirates are unacceptable for Xpert Xpress SARS-CoV-2/FLU/RSV testing.  Fact Sheet for Patients: EntrepreneurPulse.com.au  Fact Sheet for Healthcare  Providers: IncredibleEmployment.be  This test is not yet approved or cleared by the [place] FDA and has been authorized for detection and/or diagnosis of SARS-CoV-2 by FDA under an Emergency Use Authorization (EUA). This EUA will remain in effect (meaning this test can be used) for the duration of the COVID-19 declaration under Section 564(b)(1) of the Act, 21 U.S.C. section 360bbb-3(b)(1), unless the authorization is terminated or revoked.  Performed at [facility], [address], [place], [place] [address]          Radiology Studies: CT Angio Abd/Pel W and/or Wo Contrast  Result Date: 03/18/2021 CLINICAL DATA:  Lower abdominal pain, diarrhea, blood in stool EXAM: CTA ABDOMEN AND PELVIS WITHOUT AND WITH CONTRAST TECHNIQUE: Multidetector CT imaging of the abdomen and pelvis was performed using the standard protocol during bolus administration of intravenous contrast. Multiplanar reconstructed images and MIPs were obtained and reviewed to evaluate the vascular anatomy. CONTRAST:  139mL OMNIPAQUE IOHEXOL 350 MG/ML SOLN COMPARISON:  11/25/2020 FINDINGS: VASCULAR Aorta: Normal caliber aorta without aneurysm, dissection, vasculitis or significant stenosis. Mild diffuse atherosclerosis. Celiac: Patent without evidence of aneurysm, dissection, vasculitis or significant stenosis. SMA: Patent without evidence of aneurysm, dissection, vasculitis or significant stenosis. Renals: Both renal arteries are patent without evidence of aneurysm, dissection, vasculitis, fibromuscular dysplasia or significant stenosis. Stable atherosclerosis at the origin of the left renal artery. IMA: Patent without evidence of aneurysm, dissection, vasculitis or significant stenosis. Inflow: Patent without evidence of aneurysm, dissection, vasculitis or significant stenosis. Proximal Outflow: Bilateral common femoral and visualized portions of the superficial and profunda femoral arteries are  patent without evidence of aneurysm, dissection, vasculitis or significant stenosis. Veins: No obvious venous abnormality within the limitations of this arterial phase study. Review of the MIP images confirms the above findings. NON-VASCULAR Lower chest: No acute pleural or parenchymal lung disease. Hepatobiliary: Stable small hemangioma right lobe liver. No other focal abnormalities. Gallbladder is surgically absent. Chronic pneumobilia likely related to prior instrumentation of the common bile duct. Pancreas: Unremarkable. No pancreatic ductal dilatation or surrounding inflammatory changes. Spleen: Normal in size without focal abnormality. Adrenals/Urinary Tract: Adrenal glands are unremarkable. Kidneys are normal, without renal calculi, focal lesion, or hydronephrosis. Bladder is unremarkable. Stomach/Bowel: There is segmental wall thickening of the colon from the splenic flexure through the rectosigmoid junction, consistent with inflammatory or infectious colitis. While there is hyperemia of the mucosa throughout the segment of colonic wall thickening, I do not see any intraluminal contrast accumulation to suggest active gastrointestinal hemorrhage. Likely surgical clip within the mid sigmoid colon reference image 73/2, please correlate with previous endoscopic procedures. Numerous diverticula are seen throughout the descending and sigmoid colon, but the diverticula do not appear inflamed. No bowel obstruction or ileus. Lymphatic: No pathologic adenopathy within the abdomen or pelvis. Reproductive: Status post hysterectomy. No adnexal masses. Other: No free fluid or free gas.  No abdominal wall hernia. Musculoskeletal: No acute or destructive bony lesions. Postsurgical changes in the lower lumbar spine. Reconstructed images demonstrate no additional findings. IMPRESSION: VASCULAR 1. No evidence of active gastrointestinal hemorrhage. 2.  Aortic Atherosclerosis (ICD10-I70.0). NON-VASCULAR 1. Segmental colonic wall  thickening extending from the splenic flexure through the rectosigmoid junction. While there is diffuse diverticulosis throughout this region of the colon, the appearance is more suggestive of inflammatory/infectious colitis rather than diverticulitis. 2. Metallic density within the mid sigmoid colon, likely representing endoscopic clip. 3. Chronic pneumobilia likely representing prior common bile duct instrumentation or sphincter incompetence. Electronically Signed   By: Randa Ngo M.D.   On: 03/18/2021 23:12        Scheduled Meds: . [MAR Hold] atenolol  25 mg Oral BID  . [MAR Hold] atorvastatin  10 mg Oral Daily   Continuous Infusions: . sodium chloride 300 mL/hr at 03/20/21 1308  . [MAR Hold] cefTRIAXone (ROCEPHIN)  IV       LOS: 1 day    Time spent: 35 minutes  Elmarie Shiley, MD [facility]   If 7PM-7AM, please contact night-coverage [email or web address]  03/20/2021, 1:16 PM

## 2021-03-20 NOTE — Progress Notes (Signed)
PHARMACY NOTE:  ANTIMICROBIAL DOSAGE ADJUSTMENT  Current antimicrobial regimen includes a mismatch between antimicrobial dosage and indication.  As per policy approved by the Pharmacy & Therapeutics and Medical Executive Committees, the antimicrobial dosage will be adjusted accordingly.  Current antimicrobial dosage:  Ceftriaxone 1 gram IV every 24 hours  Indication: acute colitis  Renal Function:  Estimated Creatinine Clearance: 56.6 mL/min (by C-G formula based on SCr of 0.75 mg/dL). []      On intermittent HD, scheduled: []      On CRRT    Antimicrobial dosage has been changed to:  Ceftriaxone 2 grams IV every 24 hours  Thank you for allowing pharmacy to be a part of this patient's care.  Dallie Piles, [facility] 03/20/2021 7:36 AM

## 2021-03-20 NOTE — Transfer of Care (Signed)
Immediate Anesthesia Transfer of Care Note  Patient: Bianca Fry  Procedure(s) Performed: COLONOSCOPY (N/A )  Patient Location: PACU and Endoscopy Unit  Anesthesia Type:General  Level of Consciousness: drowsy and patient cooperative  Airway & Oxygen Therapy: Patient Spontanous Breathing and Patient connected to nasal cannula oxygen  Post-op Assessment: Report given to RN and Post -op Vital signs reviewed and stable  Post vital signs: Reviewed and stable  Last Vitals:  Vitals Value Taken Time  BP 109/49 03/20/2021 1312  Temp    Pulse 70   Resp 20   SpO2 98     Last Pain:  Vitals:   03/20/21 1139  TempSrc:   PainSc: 0-No pain         Complications: No complications documented.

## 2021-03-20 NOTE — Progress Notes (Signed)
Bianca Fry complained of feeling "wheezy" post procedure. She was coughing and had an episode of reflux during the procedure. Dr. Andree Elk was contacted and an order for a duoneb was given. The duoneb was administered and Bianca Fry states that she no longer feels like she is wheezing. Her primary nurse was notified of this over the telephone and report provided. Vital signs remain stable. Currently awaiting transport back to her room.

## 2021-03-20 NOTE — Anesthesia Postprocedure Evaluation (Signed)
Anesthesia Post Note  Patient: Bianca Fry  Procedure(s) Performed: COLONOSCOPY (N/A )  Patient location during evaluation: Endoscopy Anesthesia Type: General Level of consciousness: awake and alert Pain management: pain level controlled Vital Signs Assessment: post-procedure vital signs reviewed and stable Respiratory status: spontaneous breathing, nonlabored ventilation, respiratory function stable and patient connected to nasal cannula oxygen Cardiovascular status: blood pressure returned to baseline and stable Postop Assessment: no apparent nausea or vomiting Anesthetic complications: no   No complications documented.   Last Vitals:  Vitals:   03/20/21 1139 03/20/21 1312  BP: (!) 178/69 (!) 109/49  Pulse: 74 73  Resp:  16  Temp: 36.8 C   SpO2: 100% 100%    Last Pain:  Vitals:   03/20/21 1139  TempSrc:   PainSc: 0-No pain                 Arita Miss

## 2021-03-21 ENCOUNTER — Telehealth: Payer: Self-pay

## 2021-03-21 ENCOUNTER — Encounter: Payer: Self-pay | Admitting: Internal Medicine

## 2021-03-21 LAB — URINE CULTURE: Culture: 20000 — AB

## 2021-03-21 LAB — BASIC METABOLIC PANEL
Anion gap: 9 (ref 5–15)
BUN: 7 mg/dL — ABNORMAL LOW (ref 8–23)
CO2: 22 mmol/L (ref 22–32)
Calcium: 8 mg/dL — ABNORMAL LOW (ref 8.9–10.3)
Chloride: 107 mmol/L (ref 98–111)
Creatinine, Ser: 0.69 mg/dL (ref 0.44–1.00)
GFR, Estimated: >60 mL/min (ref >60)
Glucose, Bld: 89 mg/dL (ref 70–99)
Potassium: 3.7 mmol/L (ref 3.5–5.1)
Sodium: 138 mmol/L (ref 135–145)

## 2021-03-21 LAB — CBC
HCT: 30.5 % — ABNORMAL LOW (ref 36.0–46.0)
Hemoglobin: 9.8 g/dL — ABNORMAL LOW (ref 12.0–15.0)
MCH: 27.7 pg (ref 26.0–34.0)
MCHC: 32.1 g/dL (ref 30.0–36.0)
MCV: 86.2 fL (ref 80.0–100.0)
Platelets: 115 10*3/uL — ABNORMAL LOW (ref 150–400)
RBC: 3.54 MIL/uL — ABNORMAL LOW (ref 3.87–5.11)
RDW: 14.4 % (ref 11.5–15.5)
WBC: 13.4 10*3/uL — ABNORMAL HIGH (ref 4.0–10.5)
nRBC: 0.1 % (ref 0.0–0.2)

## 2021-03-21 MED ORDER — CEPHALEXIN 500 MG PO CAPS: 500.0000 mg | ORAL_CAPSULE | Freq: Three times a day (TID) | ORAL | 0 refills | Status: AC

## 2021-03-21 MED ORDER — GUAIFENESIN ER 600 MG PO TB12: 600.0000 mg | ORAL_TABLET | Freq: Two times a day (BID) | ORAL | 0 refills | Status: DC

## 2021-03-21 MED ORDER — ALBUTEROL SULFATE HFA 108 (90 BASE) MCG/ACT IN AERS: 1.0000 | INHALATION_SPRAY | Freq: Four times a day (QID) | RESPIRATORY_TRACT | 0 refills | Status: DC

## 2021-03-21 MED ORDER — METRONIDAZOLE 500 MG PO TABS
500.0000 mg | ORAL_TABLET | Freq: Three times a day (TID) | ORAL | Status: DC
  Filled 2021-03-21 (×2): qty 1

## 2021-03-21 MED ORDER — METRONIDAZOLE 500 MG PO TABS: 500.0000 mg | ORAL_TABLET | Freq: Three times a day (TID) | ORAL | 0 refills | Status: DC

## 2021-03-21 MED ORDER — CEPHALEXIN 500 MG PO CAPS: 500.0000 mg | ORAL_CAPSULE | Freq: Three times a day (TID) | ORAL | Status: DC

## 2021-03-21 NOTE — Discharge Summary (Signed)
Physician Discharge Summary  MARGOT ORIORDAN DJM:[id number] DOB: [birth year] DOA: 03/18/2021  PCP: Ria Bush, MD  Admit date: 03/18/2021 Discharge date: 03/21/2021  Admitted From: Home  Disposition: Home   Recommendations for Outpatient Follow-up:  1. Follow up with PCP in 1-2 weeks 2. Please obtain BMP/CBC in one week 3. Follow up biopsy result from colonoscopy 4. Follow up resolution of PNA  Home Health: none  Discharge Condition: Stable.  CODE STATUS: Full code Diet recommendation: Heart Healthy   Brief/Interim Summary: 75 year old with past medical history significant for PAD, hypertension, hyperlipidemia, IBS, recurrent UTI on prophylactic Keflex chronically, depression anxiety who presents to the ED complaining of abdominal pain diarrhea and bloody stool.  Patient symptoms a started on 5/31.  She reported nausea without emesis. She initially went to the urgent care CDF and GI pathogen were collected and resulted negative.  She was send to the ED due to rectal bleeding Her sister [place] PCR was positive.  She had COVID recently May 2.  She is currently denies any upper respiratory symptoms.   1-Acute colitis with rectal bleeding: -Patient presented with abdominal pain, diarrhea and bloody stool.  C. difficile panel negative. -Hemoglobin initially at 12 decreased to 11--10 -IV fluids -Hold aspirin and monitor hemoglobin -GI has been consulted. -Underwent colonoscopy 6/02; finding consistent with ischemic colitis. Biopsy pending.  -no further bleeding. Hb relatively stable.  Needs to follow up with GI> out patient.   2-HTN;  Continue with atenolol and losartan.   3-HLD;  Continue with atorvastatin.   4-PAD; hold aspirin. Resume out patient when ok by GI  5-Covid 19, positive:  Patient had covid recently 5-02. Her test is positive probably related to recent infection. No UR symptoms.  Plan to hold  Remdesivir.   Acute hypoxic respiratory failure:  Aspiration  PNA.  Patient develops hypoxemia, SOB tremors. Had possible aspiration event after colonoscopy in endo.  Place on 2 L and is stable.  Plan to stop IV fluids. Check chest x ray; pulmonary edema vs PNA.  Started nebulizer treatment.  Flutter valve.  Received IV lasix. Now on RA. WBC 13. Plan to discharge on Keflex TID for PNA.     Discharge Diagnoses:  Principal Problem:   Colitis Active Problems:   HLD (hyperlipidemia)   Essential hypertension   PAD (peripheral artery disease) (HCC)    Discharge Instructions  Discharge Instructions    Diet - low sodium heart healthy   Complete by: As directed    Increase activity slowly   Complete by: As directed      Allergies as of 03/21/2021      Reactions   Codeine Other (See Comments)   Chest pain   Nitrofurantoin Other (See Comments)   Chest pain - hospitalization   Percocet [oxycodone-acetaminophen] Nausea And Vomiting   Propoxyphene Nausea And Vomiting   Vicodin [hydrocodone-acetaminophen] Nausea And Vomiting   Amlodipine Other (See Comments)   Pedal edema   Boniva [ibandronic Acid] Diarrhea   Severe watery diarrhea - led to ER visit   Elmiron [pentosan Polysulfate]    Chest pain, tremors, wheezing    Flagyl [metronidazole] Diarrhea, Nausea Only   Fosamax [alendronate] Other (See Comments)   R hip pain   Hctz [hydrochlorothiazide] Other (See Comments)   headache   Vancomycin Diarrhea, Other (See Comments)   Severe abdominal pain   Clarithromycin Other (See Comments)   Abd. cramps   Penicillins Rash   Prednisone Other (See Comments)   Caused UTI   Sulfonamide  Derivatives Other (See Comments)   GI upset      Medication List    STOP taking these medications   aspirin 81 MG EC tablet     TAKE these medications   albuterol 108 (90 Base) MCG/ACT inhaler Commonly known as: VENTOLIN HFA Inhale 1 puff into the lungs every 6 (six) hours.   ALPRAZolam 0.5 MG tablet Commonly known as: XANAX TAKE ONE TABLET BY MOUTH  TWICE A DAY AS NEEDED FOR ANXIETY What changed: See the new instructions.   atenolol 25 MG tablet Commonly known as: TENORMIN Take 1 tablet (25 mg total) by mouth 2 (two) times daily.   atorvastatin 10 MG tablet Commonly known as: LIPITOR Take 1 tablet (10 mg total) by mouth daily.   azelastine 0.1 % nasal spray Commonly known as: ASTELIN Place 1 spray into both nostrils 2 (two) times daily. Use in each nostril as directed   Calcium-Vitamin D 600-400 MG-UNIT Tabs Take 1 tablet by mouth daily.   cephALEXin 500 MG capsule Commonly known as: KEFLEX Take 1 capsule (500 mg total) by mouth every 8 (eight) hours for 4 days. What changed:   medication strength  how much to take  when to take this   cholecalciferol 25 MCG (1000 UNIT) tablet Commonly known as: VITAMIN D Take 1,000 Units by mouth daily.   dicyclomine 10 MG capsule Commonly known as: BENTYL Take 10 mg by mouth 2 (two) times daily as needed for spasms.   estradiol 0.1 MG/GM vaginal cream Commonly known as: ESTRACE Place 0.5 g vaginally 3 (three) times a week.   fexofenadine 180 MG tablet Commonly known as: ALLEGRA Take 180 mg by mouth daily as needed for allergies.   guaiFENesin 600 MG 12 hr tablet Commonly known as: MUCINEX Take 1 tablet (600 mg total) by mouth 2 (two) times daily.   losartan 50 MG tablet Commonly known as: COZAAR Take 1 tablet (50 mg total) by mouth daily.   omeprazole 40 MG capsule Commonly known as: PRILOSEC Take 1 capsule by mouth once daily   ondansetron 4 MG tablet Commonly known as: ZOFRAN Take 1 tablet (4 mg total) by mouth every 8 (eight) hours as needed for nausea or vomiting.   valACYclovir 1000 MG tablet Commonly known as: VALTREX Take 2,000 mg by mouth in the morning and at bedtime. (for one day if needed for flares)       Follow-up Information    Ria Bush, MD On 03/28/2021.   Specialty: Family Medicine Why: You need lab work to check Hb. 2:30pm  appointment Contact information: [place] [address] [phone]        Minna Merritts, MD.   Specialty: Cardiology Contact information: [address] [place] [address] [phone]              Allergies  Allergen Reactions  . Codeine Other (See Comments)    Chest pain  . Nitrofurantoin Other (See Comments)    Chest pain - hospitalization  . Percocet [Oxycodone-Acetaminophen] Nausea And Vomiting  . Propoxyphene Nausea And Vomiting  . Vicodin [Hydrocodone-Acetaminophen] Nausea And Vomiting  . Amlodipine Other (See Comments)    Pedal edema  . Boniva [Ibandronic Acid] Diarrhea    Severe watery diarrhea - led to ER visit  . Elmiron [Pentosan Polysulfate]     Chest pain, tremors, wheezing   . Flagyl [Metronidazole] Diarrhea and Nausea Only  . Fosamax [Alendronate] Other (See Comments)    R  hip pain  . Hctz [Hydrochlorothiazide] Other (See Comments)    headache  . Vancomycin Diarrhea and Other (See Comments)    Severe abdominal pain  . Clarithromycin Other (See Comments)    Abd. cramps  . Penicillins Rash  . Prednisone Other (See Comments)    Caused UTI  . Sulfonamide Derivatives Other (See Comments)    GI upset    Consultations: GI  Procedures/Studies: DG Chest Port 1 View  Result Date: 03/20/2021 CLINICAL DATA:  Shortness of breath. Post colonoscopy with suspicion for aspiration. EXAM: PORTABLE CHEST 1 VIEW COMPARISON:  June 03, 2019. FINDINGS: Trachea midline. Cardiomediastinal contours and hilar structures are normal. Increased opacity at the LEFT lung base. Mildly increased interstitial markings are suggested. No sign of pleural effusion. No pneumothorax. Trachea is midline. On limited assessment no acute skeletal process. IMPRESSION: Increased basilar opacities worse on the LEFT raising the question of aspiration given provided history. Increased interstitial markings are suggested bilaterally. Another  explanation for above findings would be asymmetric pulmonary edema. Correlate clinically. Electronically Signed   By: Zetta Bills M.D.   On: 03/20/2021 18:00   CT Angio Abd/Pel W and/or Wo Contrast  Result Date: 03/18/2021 CLINICAL DATA:  Lower abdominal pain, diarrhea, blood in stool EXAM: CTA ABDOMEN AND PELVIS WITHOUT AND WITH CONTRAST TECHNIQUE: Multidetector CT imaging of the abdomen and pelvis was performed using the standard protocol during bolus administration of intravenous contrast. Multiplanar reconstructed images and MIPs were obtained and reviewed to evaluate the vascular anatomy. CONTRAST:  133mL OMNIPAQUE IOHEXOL 350 MG/ML SOLN COMPARISON:  11/25/2020 FINDINGS: VASCULAR Aorta: Normal caliber aorta without aneurysm, dissection, vasculitis or significant stenosis. Mild diffuse atherosclerosis. Celiac: Patent without evidence of aneurysm, dissection, vasculitis or significant stenosis. SMA: Patent without evidence of aneurysm, dissection, vasculitis or significant stenosis. Renals: Both renal arteries are patent without evidence of aneurysm, dissection, vasculitis, fibromuscular dysplasia or significant stenosis. Stable atherosclerosis at the origin of the left renal artery. IMA: Patent without evidence of aneurysm, dissection, vasculitis or significant stenosis. Inflow: Patent without evidence of aneurysm, dissection, vasculitis or significant stenosis. Proximal Outflow: Bilateral common femoral and visualized portions of the superficial and profunda femoral arteries are patent without evidence of aneurysm, dissection, vasculitis or significant stenosis. Veins: No obvious venous abnormality within the limitations of this arterial phase study. Review of the MIP images confirms the above findings. NON-VASCULAR Lower chest: No acute pleural or parenchymal lung disease. Hepatobiliary: Stable small hemangioma right lobe liver. No other focal abnormalities. Gallbladder is surgically absent. Chronic  pneumobilia likely related to prior instrumentation of the common bile duct. Pancreas: Unremarkable. No pancreatic ductal dilatation or surrounding inflammatory changes. Spleen: Normal in size without focal abnormality. Adrenals/Urinary Tract: Adrenal glands are unremarkable. Kidneys are normal, without renal calculi, focal lesion, or hydronephrosis. Bladder is unremarkable. Stomach/Bowel: There is segmental wall thickening of the colon from the splenic flexure through the rectosigmoid junction, consistent with inflammatory or infectious colitis. While there is hyperemia of the mucosa throughout the segment of colonic wall thickening, I do not see any intraluminal contrast accumulation to suggest active gastrointestinal hemorrhage. Likely surgical clip within the mid sigmoid colon reference image 73/2, please correlate with previous endoscopic procedures. Numerous diverticula are seen throughout the descending and sigmoid colon, but the diverticula do not appear inflamed. No bowel obstruction or ileus. Lymphatic: No pathologic adenopathy within the abdomen or pelvis. Reproductive: Status post hysterectomy. No adnexal masses. Other: No free fluid or free gas.  No abdominal wall hernia. Musculoskeletal: No acute or  destructive bony lesions. Postsurgical changes in the lower lumbar spine. Reconstructed images demonstrate no additional findings. IMPRESSION: VASCULAR 1. No evidence of active gastrointestinal hemorrhage. 2.  Aortic Atherosclerosis (ICD10-I70.0). NON-VASCULAR 1. Segmental colonic wall thickening extending from the splenic flexure through the rectosigmoid junction. While there is diffuse diverticulosis throughout this region of the colon, the appearance is more suggestive of inflammatory/infectious colitis rather than diverticulitis. 2. Metallic density within the mid sigmoid colon, likely representing endoscopic clip. 3. Chronic pneumobilia likely representing prior common bile duct instrumentation or  sphincter incompetence. Electronically Signed   By: Randa Ngo M.D.   On: 03/18/2021 23:12     Subjective: She is breathing well, mild cough. She feels ready to go home , no blood in the stool.   Discharge Exam: Vitals:   03/21/21 0748 03/21/21 1139  BP: (!) 152/61 (!) 142/53  Pulse: 84 74  Resp:  18  Temp: 98.5 F (36.9 C) 98.7 F (37.1 C)  SpO2: 95% 98%     General: Pt is alert, awake, not in acute distress Cardiovascular: RRR, S1/S2 +, no rubs, no gallops Respiratory: CTA bilaterally, no wheezing, no rhonchi Abdominal: Soft, NT, ND, bowel sounds + Extremities: no edema, no cyanosis    The results of significant diagnostics from this hospitalization (including imaging, microbiology, ancillary and laboratory) are listed below for reference.     Microbiology: Recent Results (from the past 240 hour(s))  Gastrointestinal Panel by PCR , Stool     Status: None   Collection Time: 03/18/21  7:08 PM   Specimen: Stool  Result Value Ref Range Status   Campylobacter species NOT DETECTED NOT DETECTED Final   Plesimonas shigelloides NOT DETECTED NOT DETECTED Final   Salmonella species NOT DETECTED NOT DETECTED Final   Yersinia enterocolitica NOT DETECTED NOT DETECTED Final   Vibrio species NOT DETECTED NOT DETECTED Final   Vibrio cholerae NOT DETECTED NOT DETECTED Final   Enteroaggregative E coli (EAEC) NOT DETECTED NOT DETECTED Final   Enteropathogenic E coli (EPEC) NOT DETECTED NOT DETECTED Final   Enterotoxigenic E coli (ETEC) NOT DETECTED NOT DETECTED Final   Shiga like toxin producing E coli (STEC) NOT DETECTED NOT DETECTED Final   Shigella/Enteroinvasive E coli (EIEC) NOT DETECTED NOT DETECTED Final   Cryptosporidium NOT DETECTED NOT DETECTED Final   Cyclospora cayetanensis NOT DETECTED NOT DETECTED Final   Entamoeba histolytica NOT DETECTED NOT DETECTED Final   Giardia lamblia NOT DETECTED NOT DETECTED Final   Adenovirus F40/41 NOT DETECTED NOT DETECTED Final    Astrovirus NOT DETECTED NOT DETECTED Final   Norovirus GI/GII NOT DETECTED NOT DETECTED Final   Rotavirus A NOT DETECTED NOT DETECTED Final   Sapovirus (I, II, IV, and V) NOT DETECTED NOT DETECTED Final    Comment: Performed at [facility], [address] [place], [place] [address]  C Difficile Quick Screen w PCR reflex     Status: None   Collection Time: 03/18/21  7:08 PM   Specimen: Stool  Result Value Ref Range Status   C Diff antigen NEGATIVE NEGATIVE Final   C Diff toxin NEGATIVE NEGATIVE Final   C Diff interpretation No C. difficile detected.  Final    Comment: Performed at [facility], [address] [place], [place] [address]  Urine culture     Status: Abnormal   Collection Time: 03/18/21  9:36 PM   Specimen: Urine, Random  Result Value Ref Range Status   Specimen Description   Final  URINE, RANDOM Performed at [facility], [address] [place], [place] [address]    Special Requests   Final    NONE Performed at [facility], [place], [place] [address]    Culture 20,000 COLONIES/mL ESCHERICHIA COLI (A)  Final   Report Status 03/21/2021 FINAL  Final   Organism ID, Bacteria ESCHERICHIA COLI (A)  Final      Susceptibility   Escherichia coli - MIC*    AMPICILLIN >=32 RESISTANT Resistant     CEFAZOLIN <=4 SENSITIVE Sensitive     CEFEPIME <=0.12 SENSITIVE Sensitive     CEFTRIAXONE <=0.25 SENSITIVE Sensitive     CIPROFLOXACIN >=4 RESISTANT Resistant     GENTAMICIN <=1 SENSITIVE Sensitive     IMIPENEM <=0.25 SENSITIVE Sensitive     NITROFURANTOIN <=16 SENSITIVE Sensitive     TRIMETH/SULFA >=320 RESISTANT Resistant     AMPICILLIN/SULBACTAM 8 SENSITIVE Sensitive     PIP/TAZO <=4 SENSITIVE Sensitive     * 20,000 COLONIES/mL ESCHERICHIA COLI  Resp Panel by RT-PCR (Flu A&B, Covid) Nasopharyngeal Swab     Status: Abnormal   Collection Time: 03/18/21 11:36 PM   Specimen: Nasopharyngeal Swab; Nasopharyngeal(NP)  swabs in vial transport medium  Result Value Ref Range Status   SARS Coronavirus 2 by RT PCR POSITIVE (A) NEGATIVE Final    Comment: RESULT CALLED TO, READ BACK BY AND VERIFIED WITH: Mimi Faggart @0056  on 03/19/21 skl (NOTE) SARS-CoV-2 target nucleic acids are DETECTED.  The SARS-CoV-2 RNA is generally detectable in upper respiratory specimens during the acute phase of infection. Positive results are indicative of the presence of the identified virus, but do not rule out bacterial infection or co-infection with other pathogens not detected by the test. Clinical correlation with patient history and other diagnostic information is necessary to determine patient infection status. The expected result is Negative.  Fact Sheet for Patients: EntrepreneurPulse.com.au  Fact Sheet for Healthcare Providers: IncredibleEmployment.be  This test is not yet approved or cleared by the [place] FDA and  has been authorized for detection and/or diagnosis of SARS-CoV-2 by FDA under an Emergency Use Authorization (EUA).  This EUA will remain in effect (meaning this test can be  used) for the duration of  the COVID-19 declaration under Section 564(b)(1) of the Act, 21 U.S.C. section 360bbb-3(b)(1), unless the authorization is terminated or revoked sooner.     Influenza A by PCR NEGATIVE NEGATIVE Final   Influenza B by PCR NEGATIVE NEGATIVE Final    Comment: (NOTE) The Xpert Xpress SARS-CoV-2/FLU/RSV plus assay is intended as an aid in the diagnosis of influenza from Nasopharyngeal swab specimens and should not be used as a sole basis for treatment. Nasal washings and aspirates are unacceptable for Xpert Xpress SARS-CoV-2/FLU/RSV testing.  Fact Sheet for Patients: EntrepreneurPulse.com.au  Fact Sheet for Healthcare Providers: IncredibleEmployment.be  This test is not yet approved or cleared by the [place] FDA  and has been authorized for detection and/or diagnosis of SARS-CoV-2 by FDA under an Emergency Use Authorization (EUA). This EUA will remain in effect (meaning this test can be used) for the duration of the COVID-19 declaration under Section 564(b)(1) of the Act, 21 U.S.C. section 360bbb-3(b)(1), unless the authorization is terminated or revoked.  Performed at [facility], [address] [place], [place] [address]      Labs: BNP (last 3 results) No results for input(s): BNP in the last 8760 hours. Basic Metabolic Panel: Recent Labs  Lab 03/18/21 2133 03/19/21 0508 03/21/21  0453  NA 137 137 138  K 4.0 3.6 3.7  CL 101 104 107  CO2 24 26 22   GLUCOSE 114* 106* 89  BUN 18 16 7*  CREATININE 0.91 0.75 0.69  CALCIUM 9.4 8.6* 8.0*   Liver Function Tests: Recent Labs  Lab 03/18/21 2133  AST 20  ALT 14  ALKPHOS 93  BILITOT 0.8  PROT 7.5  ALBUMIN 4.2   No results for input(s): LIPASE, AMYLASE in the last 168 hours. No results for input(s): AMMONIA in the last 168 hours. CBC: Recent Labs  Lab 03/18/21 2133 03/19/21 0508 03/19/21 1314 03/19/21 2212 03/20/21 0436 03/20/21 1824 03/21/21 0453  WBC 13.7* 11.9*  --   --   --   --  13.4*  HGB 12.7 11.0* 10.8* 10.9* 10.3* 10.0* 9.8*  HCT 39.0 33.4* 33.1* 33.5* 32.2* 31.0* 30.5*  MCV 85.9 85.0  --   --   --   --  86.2  PLT 137* 120*  --   --   --   --  115*   Cardiac Enzymes: No results for input(s): CKTOTAL, CKMB, CKMBINDEX, TROPONINI in the last 168 hours. BNP: Invalid input(s): POCBNP CBG: No results for input(s): GLUCAP in the last 168 hours. D-Dimer No results for input(s): DDIMER in the last 72 hours. Hgb A1c No results for input(s): HGBA1C in the last 72 hours. Lipid Profile No results for input(s): CHOL, HDL, LDLCALC, TRIG, CHOLHDL, LDLDIRECT in the last 72 hours. Thyroid function studies No results for input(s): TSH, T4TOTAL, T3FREE, THYROIDAB in the last 72 hours.  Invalid input(s):  FREET3 Anemia work up No results for input(s): VITAMINB12, FOLATE, FERRITIN, TIBC, IRON, RETICCTPCT in the last 72 hours. Urinalysis    Component Value Date/Time   COLORURINE YELLOW (A) 03/18/2021 2136   APPEARANCEUR CLEAR (A) 03/18/2021 2136   APPEARANCEUR Hazy 11/19/2011 1853   LABSPEC 1.018 03/18/2021 2136   LABSPEC 1.011 11/19/2011 1853   PHURINE 5.0 03/18/2021 2136   GLUCOSEU NEGATIVE 03/18/2021 2136   GLUCOSEU Negative 11/19/2011 1853   HGBUR SMALL (A) 03/18/2021 2136   HGBUR 1+ 01/28/2010 0931   BILIRUBINUR NEGATIVE 03/18/2021 2136   BILIRUBINUR Neg 11/20/2019 1045   BILIRUBINUR Negative 11/19/2011 1853   KETONESUR 5 (A) 03/18/2021 2136   PROTEINUR NEGATIVE 03/18/2021 2136   UROBILINOGEN 0.2 11/20/2019 1045   UROBILINOGEN 0.2 01/12/2015 2138   NITRITE NEGATIVE 03/18/2021 2136   LEUKOCYTESUR SMALL (A) 03/18/2021 2136   LEUKOCYTESUR 1+ 11/19/2011 1853   Sepsis Labs Invalid input(s): PROCALCITONIN,  WBC,  LACTICIDVEN Microbiology Recent Results (from the past 240 hour(s))  Gastrointestinal Panel by PCR , Stool     Status: None   Collection Time: 03/18/21  7:08 PM   Specimen: Stool  Result Value Ref Range Status   Campylobacter species NOT DETECTED NOT DETECTED Final   Plesimonas shigelloides NOT DETECTED NOT DETECTED Final   Salmonella species NOT DETECTED NOT DETECTED Final   Yersinia enterocolitica NOT DETECTED NOT DETECTED Final   Vibrio species NOT DETECTED NOT DETECTED Final   Vibrio cholerae NOT DETECTED NOT DETECTED Final   Enteroaggregative E coli (EAEC) NOT DETECTED NOT DETECTED Final   Enteropathogenic E coli (EPEC) NOT DETECTED NOT DETECTED Final   Enterotoxigenic E coli (ETEC) NOT DETECTED NOT DETECTED Final   Shiga like toxin producing E coli (STEC) NOT DETECTED NOT DETECTED Final   Shigella/Enteroinvasive E coli (EIEC) NOT DETECTED NOT DETECTED Final   Cryptosporidium NOT DETECTED NOT DETECTED Final   Cyclospora cayetanensis NOT DETECTED  NOT DETECTED  Final   Entamoeba histolytica NOT DETECTED NOT DETECTED Final   Giardia lamblia NOT DETECTED NOT DETECTED Final   Adenovirus F40/41 NOT DETECTED NOT DETECTED Final   Astrovirus NOT DETECTED NOT DETECTED Final   Norovirus GI/GII NOT DETECTED NOT DETECTED Final   Rotavirus A NOT DETECTED NOT DETECTED Final   Sapovirus (I, II, IV, and V) NOT DETECTED NOT DETECTED Final    Comment: Performed at [facility], [address] [place], [place] [address]  C Difficile Quick Screen w PCR reflex     Status: None   Collection Time: 03/18/21  7:08 PM   Specimen: Stool  Result Value Ref Range Status   C Diff antigen NEGATIVE NEGATIVE Final   C Diff toxin NEGATIVE NEGATIVE Final   C Diff interpretation No C. difficile detected.  Final    Comment: Performed at [facility], [address], [place], [place] [address]  Urine culture     Status: Abnormal   Collection Time: 03/18/21  9:36 PM   Specimen: Urine, Random  Result Value Ref Range Status   Specimen Description   Final    URINE, RANDOM Performed at [facility], [address] [place], [place] [address]    Special Requests   Final    NONE Performed at [facility], [place],  [address]    Culture 20,000 COLONIES/mL ESCHERICHIA COLI (A)  Final   Report Status 03/21/2021 FINAL  Final   Organism ID, Bacteria ESCHERICHIA COLI (A)  Final      Susceptibility   Escherichia coli - MIC*    AMPICILLIN >=32 RESISTANT Resistant     CEFAZOLIN <=4 SENSITIVE Sensitive     CEFEPIME <=0.12 SENSITIVE Sensitive     CEFTRIAXONE <=0.25 SENSITIVE Sensitive     CIPROFLOXACIN >=4 RESISTANT Resistant     GENTAMICIN <=1 SENSITIVE Sensitive     IMIPENEM <=0.25 SENSITIVE Sensitive     NITROFURANTOIN <=16 SENSITIVE Sensitive     TRIMETH/SULFA >=320 RESISTANT Resistant     AMPICILLIN/SULBACTAM 8 SENSITIVE Sensitive     PIP/TAZO <=4 SENSITIVE Sensitive     * 20,000 COLONIES/mL ESCHERICHIA COLI   Resp Panel by RT-PCR (Flu A&B, Covid) Nasopharyngeal Swab     Status: Abnormal   Collection Time: 03/18/21 11:36 PM   Specimen: Nasopharyngeal Swab; Nasopharyngeal(NP) swabs in vial transport medium  Result Value Ref Range Status   SARS Coronavirus 2 by RT PCR POSITIVE (A) NEGATIVE Final    Comment: RESULT CALLED TO, READ BACK BY AND VERIFIED WITH: Mimi Faggart @0056  on 03/19/21 skl (NOTE) SARS-CoV-2 target nucleic acids are DETECTED.  The SARS-CoV-2 RNA is generally detectable in upper respiratory specimens during the acute phase of infection. Positive results are indicative of the presence of the identified virus, but do not rule out bacterial infection or co-infection with other pathogens not detected by the test. Clinical correlation with patient history and other diagnostic information is necessary to determine patient infection status. The expected result is Negative.  Fact Sheet for Patients: EntrepreneurPulse.com.au  Fact Sheet for Healthcare Providers: IncredibleEmployment.be  This test is not yet approved or cleared by the [place] FDA and  has been authorized for detection and/or diagnosis of SARS-CoV-2 by FDA under an Emergency Use Authorization (EUA).  This EUA will remain in effect (meaning this test can be  used) for the duration of  the COVID-19 declaration under Section 564(b)(1) of the Act, 21 U.S.C. section 360bbb-3(b)(1), unless the  authorization is terminated or revoked sooner.     Influenza A by PCR NEGATIVE NEGATIVE Final   Influenza B by PCR NEGATIVE NEGATIVE Final    Comment: (NOTE) The Xpert Xpress SARS-CoV-2/FLU/RSV plus assay is intended as an aid in the diagnosis of influenza from Nasopharyngeal swab specimens and should not be used as a sole basis for treatment. Nasal washings and aspirates are unacceptable for Xpert Xpress SARS-CoV-2/FLU/RSV testing.  Fact Sheet for  Patients: EntrepreneurPulse.com.au  Fact Sheet for Healthcare Providers: IncredibleEmployment.be  This test is not yet approved or cleared by the [place] FDA and has been authorized for detection and/or diagnosis of SARS-CoV-2 by FDA under an Emergency Use Authorization (EUA). This EUA will remain in effect (meaning this test can be used) for the duration of the COVID-19 declaration under Section 564(b)(1) of the Act, 21 U.S.C. section 360bbb-3(b)(1), unless the authorization is terminated or revoked.  Performed at [facility], [address], [place], [place] [address]      Time coordinating discharge: 40 minutes  SIGNED:   Elmarie Shiley, MD  [facility]

## 2021-03-21 NOTE — Discharge Instructions (Signed)
Colitis  Colitis is a condition in which the colon is inflamed. It can cause diarrhea, blood in the stool, and abdominal pain. Colitis can last a short time (be acute), or it may last a long time (become chronic). What are the causes? This condition may be caused by:  Infections from viruses or bacteria.  A reaction to medicine.  Certain autoimmune diseases, such as Crohn's disease or ulcerative colitis.  Radiation treatment.  Decreased blood flow to the bowel (ischemia). What are the signs or symptoms? Symptoms of this condition include:  Diarrhea, blood in the stool, or black, tarry stool.  Pain in the joints or abdominal pain.  Fever or fatigue.  Vomiting.  Weight loss.  Bloating.  Having fewer bowel movements than usual.  A strong and sudden urge to have a bowel movement.  Feeling like the bowel is not empty after a bowel movement. How is this diagnosed? This condition may be diagnosed based on a stool test and a blood test. You may also have other tests, such as:  X-rays.  CT scan.  Colonoscopy.  Endoscopy.  Biopsy. How is this treated? Treatment for this condition depends on the cause. This condition may be treated with:  Steps to rest the bowel, such as not eating or drinking for a period of time.  Fluids that are given through an IV.  Medicine for pain and diarrhea.  Antibiotic medicines.  Cortisone medicines.  Surgery. Follow these instructions at home: Eating and drinking  Follow instructions from your health care provider about eating or drinking restrictions.  Drink enough fluid to keep your urine pale yellow.  Work with a dietitian to determine whether certain foods cause your condition to flare up.  Avoid foods or drinks that cause flare-ups.  Eat a well-balanced diet.   General instructions  If you were prescribed an antibiotic medicine, take it as told by your health care provider. Do not stop taking the antibiotic even if you  start to feel better.  Take over-the-counter and prescription medicines only as told by your health care provider.  Keep all follow-up visits. This is important. Contact a health care provider if:  Your symptoms do not go away.  You develop new symptoms. Get help right away if:  You have a fever that does not go away with treatment.  You develop chills.  You have extreme weakness, fainting, or dehydration.  You vomit repeatedly.  You develop severe pain in your abdomen.  You pass bloody or tarry stool. Summary  Colitis is a condition in which the colon is inflamed. Colitis can last a short time (be acute), or it may last a long time (become chronic).  Treatment for this condition depends on the cause and may include resting the bowel, taking medicines, or having surgery.  If you were prescribed an antibiotic medicine, take it as told by your health care provider. Do not stop taking the antibiotic even if you start to feel better.  Get help right away if you develop severe pain in your abdomen.  Keep all follow-up visits. This is important. This information is not intended to replace advice given to you by your health care provider. Make sure you discuss any questions you have with your health care provider. Document Revised: 06/11/2020 Document Reviewed: 06/11/2020 Elsevier Patient Education  2021 Elsevier Inc.  

## 2021-03-21 NOTE — Care Management Important Message (Signed)
Important Message  Patient Details  Name: Bianca Fry MRN: [id number] Date of Birth: 11/08/1945   Medicare Important Message Given:  N/A - LOS <3 / Initial given by admissions  Initial Medicare IM reviewed with patient by Brantley Fling, Patient Access Associate on 03/20/2021 at 8:43am.      Dannette Barbara 03/21/2021, 8:29 AM

## 2021-03-21 NOTE — Telephone Encounter (Signed)
Transition Care Management Follow-up Telephone Call  Date of discharge and from where: 03/21/2021, [facility]  How have you been since you were released from the hospital? Patient is doing much better. No complaints at this time.   Any questions or concerns? No  Items Reviewed:  Did the pt receive and understand the discharge instructions provided? Yes   Medications obtained and verified? Yes   Other? No   Any new allergies since your discharge? No   Dietary orders reviewed? Yes  Do you have support at home? Yes   Home Care and Equipment/Supplies: Were home health services ordered? not applicable If so, what is the name of the agency? N/A  Has the agency set up a time to come to the patient's home? not applicable Were any new equipment or medical supplies ordered?  No What is the name of the medical supply agency? N/A Were you able to get the supplies/equipment? not applicable Do you have any questions related to the use of the equipment or supplies? No  Functional Questionnaire: (I = Independent and D = Dependent) ADLs: I  Bathing/Dressing- I  Meal Prep- I  Eating- I  Maintaining continence- I  Transferring/Ambulation- I  Managing Meds- I  Follow up appointments reviewed:   PCP Hospital f/u appt confirmed? Yes  Scheduled to see Dr. Danise Mina on 03/31/2021 @ 3 pm.  [facility] f/u appt confirmed? follow up with cardiology.  Are transportation arrangements needed? No   If their condition worsens, is the pt aware to call PCP or go to the Emergency Dept.? Yes  Was the patient provided with contact information for the PCP's office or ED? Yes  Was to pt encouraged to call back with questions or concerns? Yes

## 2021-03-24 ENCOUNTER — Encounter: Payer: Self-pay | Admitting: Family Medicine

## 2021-03-24 LAB — SURGICAL PATHOLOGY

## 2021-03-28 ENCOUNTER — Ambulatory Visit: Payer: PPO | Admitting: Family Medicine

## 2021-03-31 ENCOUNTER — Encounter: Payer: Self-pay | Admitting: Family Medicine

## 2021-03-31 ENCOUNTER — Ambulatory Visit (INDEPENDENT_AMBULATORY_CARE_PROVIDER_SITE_OTHER): Payer: PPO | Admitting: Family Medicine

## 2021-03-31 ENCOUNTER — Other Ambulatory Visit: Payer: Self-pay

## 2021-03-31 ENCOUNTER — Telehealth: Payer: Self-pay

## 2021-03-31 VITALS — BP 156/66 | HR 68 | Temp 97.5°F | Ht 64.0 in | Wt 148.4 lb

## 2021-03-31 DIAGNOSIS — D509 Iron deficiency anemia, unspecified: Secondary | ICD-10-CM | POA: Diagnosis not present

## 2021-03-31 DIAGNOSIS — E785 Hyperlipidemia, unspecified: Secondary | ICD-10-CM | POA: Diagnosis not present

## 2021-03-31 DIAGNOSIS — J690 Pneumonitis due to inhalation of food and vomit: Secondary | ICD-10-CM

## 2021-03-31 DIAGNOSIS — R600 Localized edema: Secondary | ICD-10-CM

## 2021-03-31 DIAGNOSIS — K559 Vascular disorder of intestine, unspecified: Secondary | ICD-10-CM

## 2021-03-31 DIAGNOSIS — B370 Candidal stomatitis: Secondary | ICD-10-CM

## 2021-03-31 DIAGNOSIS — I10 Essential (primary) hypertension: Secondary | ICD-10-CM

## 2021-03-31 MED ORDER — LOSARTAN POTASSIUM 50 MG PO TABS: 75.0000 mg | ORAL_TABLET | Freq: Every day | ORAL | 3 refills | Status: DC

## 2021-03-31 MED ORDER — NYSTATIN 100000 UNIT/ML MT SUSP: 5.0000 mL | Freq: Three times a day (TID) | OROMUCOSAL | 0 refills | Status: DC

## 2021-03-31 NOTE — Progress Notes (Signed)
Patient ID: Bianca Fry, female    DOB: [birth year], 75 y.o.   MRN: [id number]  This visit was conducted in person.  BP (!) 156/66   Pulse 68   Temp (!) 97.5 F (36.4 C) (Temporal)   Ht 5\' 4"  (1.626 m)   Wt 148 lb 7 oz (67.3 kg)   LMP 10/20/1979 (Within Years)   SpO2 97%   BMI 25.48 kg/m   BP Readings from Last 3 Encounters:  03/31/21 (!) 156/66  03/21/21 (!) 142/53  03/18/21 (!) 170/68  160s/70 on repeat check  CC: hosp f/u visit  Subjective:   HPI: Bianca Fry is a 75 y.o. female presenting on 03/31/2021 for Hospitalization Follow-up (Admitted on 03/18/21 to [facility], dx rectal bleeding.  )   Recent hospitalization for abdominal pain associated with nausea, diarrhea and bloody stools. Records reviewed. C diff panel negative as was stool PCR. Underwent colonoscopy 03/20/2021 by Dr Alice Reichert - findings consistent with ischemic colitis (in h/o same). She subsequently developed possible aspiration (hypoxemia, dyspnea) and CXR showed pulm edema vs PNA - discharged on keflex TID empiric treatment for aspiration pneumonia. Pt states she was given 2 different antibiotics. Recommended outpatient GI f/u - this has not been scheduled. Aspirin was held.   Tested COVID PCR positive - did have COVID infection early May, presumed false positive due to this.   Chronic LLE swelling.  Notes worse bilateral swelling since out of the hospital.   Colonoscopy biopsy returned consistent with ischemic colitis, negative for dsyplasia/ malignancy.    Admit date: 03/18/2021 Discharge date: 03/21/2021 TCM phone call completed on 03/21/2021.   Admitted From: Home  Disposition: Home    Discharge Diagnoses:  Principal Problem:   Colitis Active Problems:   HLD (hyperlipidemia)   Essential hypertension   PAD (peripheral artery disease) ([place])  Recommendations for Outpatient Follow-up:  Follow up with PCP in 1-2 weeks Please obtain BMP/CBC in one week Follow up biopsy result from colonoscopy Follow up  resolution of PNA   Home Health: none Discharge Condition: Stable.  CODE STATUS: Full code Diet recommendation: Heart Healthy     Relevant past medical, surgical, family and social history reviewed and updated as indicated. Interim medical history since our last visit reviewed. Allergies and medications reviewed and updated. Outpatient Medications Prior to Visit  Medication Sig Dispense Refill   ALPRAZolam (XANAX) 0.5 MG tablet TAKE ONE TABLET BY MOUTH TWICE A DAY AS NEEDED FOR ANXIETY (Patient taking differently: Take 0.5 mg by mouth 2 (two) times daily as needed for anxiety.) 40 tablet 0   atenolol (TENORMIN) 25 MG tablet Take 1 tablet (25 mg total) by mouth 2 (two) times daily. 180 tablet 1   atorvastatin (LIPITOR) 10 MG tablet Take 1 tablet (10 mg total) by mouth daily. 90 tablet 3   Calcium Carb-Cholecalciferol (CALCIUM-VITAMIN D) 600-400 MG-UNIT TABS Take 1 tablet by mouth daily.     cephALEXin (KEFLEX) 250 MG capsule Take by mouth daily. Takes to prevent UTIs     cholecalciferol (VITAMIN D) 25 MCG (1000 UNIT) tablet Take 1,000 Units by mouth daily.     dicyclomine (BENTYL) 10 MG capsule Take 10 mg by mouth 2 (two) times daily as needed for spasms. 90 capsule    estradiol (ESTRACE) 0.1 MG/GM vaginal cream Place 0.5 g vaginally 3 (three) times a week. 43 g    fexofenadine (ALLEGRA) 180 MG tablet Take 180 mg by mouth daily as needed for allergies.     omeprazole (  PRILOSEC) 40 MG capsule Take 1 capsule by mouth once daily (Patient taking differently: Take 40 mg by mouth daily.) 90 capsule 3   ondansetron (ZOFRAN) 4 MG tablet Take 1 tablet (4 mg total) by mouth every 8 (eight) hours as needed for nausea or vomiting.     valACYclovir (VALTREX) 1000 MG tablet Take 2,000 mg by mouth in the morning and at bedtime. (for one day if needed for flares)     losartan (COZAAR) 50 MG tablet Take 1 tablet (50 mg total) by mouth daily. 90 tablet 3   albuterol (VENTOLIN HFA) 108 (90 Base) MCG/ACT inhaler  Inhale 1 puff into the lungs every 6 (six) hours. (Patient not taking: Reported on 03/31/2021) 1 each 0   guaiFENesin (MUCINEX) 600 MG 12 hr tablet Take 1 tablet (600 mg total) by mouth 2 (two) times daily. (Patient not taking: Reported on 03/31/2021) 20 tablet 0   azelastine (ASTELIN) 0.1 % nasal spray Place 1 spray into both nostrils 2 (two) times daily. Use in each nostril as directed 30 mL 12   No facility-administered medications prior to visit.     Per HPI unless specifically indicated in ROS section below Review of Systems Objective:  BP (!) 156/66   Pulse 68   Temp (!) 97.5 F (36.4 C) (Temporal)   Ht 5\' 4"  (1.626 m)   Wt 148 lb 7 oz (67.3 kg)   LMP 10/20/1979 (Within Years)   SpO2 97%   BMI 25.48 kg/m   Wt Readings from Last 3 Encounters:  03/31/21 148 lb 7 oz (67.3 kg)  03/19/21 149 lb 4 oz (67.7 kg)  02/12/21 149 lb 8 oz (67.8 kg)      Physical Exam Vitals and nursing note reviewed.  Constitutional:      Appearance: Normal appearance. She is not ill-appearing.  HENT:     Mouth/Throat:     Mouth: Mucous membranes are moist.     Comments: Thick white residue on midline tongue Eyes:     Extraocular Movements: Extraocular movements intact.     Conjunctiva/sclera: Conjunctivae normal.     Pupils: Pupils are equal, round, and reactive to light.  Cardiovascular:     Rate and Rhythm: Normal rate and regular rhythm.     Pulses: Normal pulses.     Heart sounds: Normal heart sounds. No murmur heard. Pulmonary:     Effort: Pulmonary effort is normal. No respiratory distress.     Breath sounds: Normal breath sounds. No wheezing or rhonchi.  Abdominal:     General: Bowel sounds are normal. There is no distension.     Palpations: Abdomen is soft. There is no mass.     Tenderness: There is no abdominal tenderness. There is no right CVA tenderness, left CVA tenderness, guarding or rebound.     Hernia: No hernia is present.  Musculoskeletal:     Cervical back: Normal range  of motion and neck supple. No rigidity.     Right lower leg: Edema (tr) present.     Left lower leg: Edema (tr) present.  Lymphadenopathy:     Cervical: No cervical adenopathy.  Skin:    General: Skin is warm and dry.     Findings: No rash.  Neurological:     Mental Status: She is alert.  Psychiatric:        Mood and Affect: Mood normal.        Behavior: Behavior normal.      Lab Results  Component Value Date  CHOL 157 05/16/2020   HDL 57.80 05/16/2020   LDLCALC 70 05/16/2020   LDLDIRECT 103.0 04/20/2018   TRIG 147.0 05/16/2020   CHOLHDL 3 05/16/2020    DG Chest Port 1 View CLINICAL DATA:  Shortness of breath. Post colonoscopy with suspicion for aspiration.  EXAM: PORTABLE CHEST 1 VIEW  COMPARISON:  June 03, 2019.  FINDINGS: Trachea midline. Cardiomediastinal contours and hilar structures are normal.  Increased opacity at the LEFT lung base.  Mildly increased interstitial markings are suggested. No sign of pleural effusion. No pneumothorax. Trachea is midline.  On limited assessment no acute skeletal process.  IMPRESSION: Increased basilar opacities worse on the LEFT raising the question of aspiration given provided history.  Increased interstitial markings are suggested bilaterally. Another explanation for above findings would be asymmetric pulmonary edema. Correlate clinically.  Electronically Signed   By: Zetta Bills M.D.   On: 03/20/2021 18:00   Assessment & Plan:  This visit occurred during the SARS-CoV-2 public health emergency.  Safety protocols were in place, including screening questions prior to the visit, additional usage of staff PPE, and extensive cleaning of exam room while observing appropriate contact time as indicated for disinfecting solutions.   Problem List Items Addressed This Visit     HLD (hyperlipidemia)    Continue low dose atorvastatin. Goal LDL likely <70  The 10-year ASCVD risk score Mikey Bussing DC Jr., et al., 2013) is:  32.3%   Values used to calculate the score:     Age: 57 years     Sex: Female     Is Non-Hispanic African American: No     Diabetic: No     Tobacco smoker: No     Systolic Blood Pressure: 379 mmHg     Is BP treated: Yes     HDL Cholesterol: 57.8 mg/dL     Total Cholesterol: 157 mg/dL        Relevant Medications   losartan (COZAAR) 50 MG tablet   Iron deficiency anemia    Update CBC, not on oral iron.       Essential hypertension    BP remaining elevated. Atenolol dosing limited by heart rate. Will increase losartan to 75mg  daily. Update with home BP readings.        Relevant Medications   losartan (COZAAR) 50 MG tablet   Ischemic colitis ([place]) - Primary    Recent hospitalization for this, largely recovered now. Colonoscopy biopsy showed ischemic colitis negative for dysplasia and malignancy. Reviewed importance of blood pressure and cholesterol control. See below.        Relevant Orders   CBC with Differential/Platelet   Basic metabolic panel   Pedal edema    Mild - check BNP       Relevant Orders   Brain natriuretic peptide   Aspiration pneumonia ([place])    Presumed LLL aspiration - pt states she received 2 separate abx for this however records only show keflex 4d course which she has since completed. Symptoms largely resolved.       Relevant Medications   cephALEXin (KEFLEX) 250 MG capsule   Thrush    Treat possible thrush with nystatin swish/swallow       Relevant Medications   cephALEXin (KEFLEX) 250 MG capsule   nystatin (MYCOSTATIN) 100000 UNIT/ML suspension     Meds ordered this encounter  Medications   nystatin (MYCOSTATIN) 100000 UNIT/ML suspension    Sig: Take 5 mLs (500,000 Units total) by mouth 3 (three) times daily.    Dispense:  120 mL    Refill:  0   losartan (COZAAR) 50 MG tablet    Sig: Take 1.5 tablets (75 mg total) by mouth daily.    Dispense:  135 tablet    Refill:  3    Note new sig    Orders Placed This Encounter   Procedures   CBC with Differential/Platelet   Basic metabolic panel   Brain natriuretic peptide    Patient instructions: Good to see you today.  For possible thrush - treat with nystatin swish/swallow.  Labs today.  Return as needed.   Follow up plan: Return if symptoms worsen or fail to improve.  Ria Bush, MD

## 2021-03-31 NOTE — Telephone Encounter (Signed)
Pt was seen in office today and forgot to ask Dr. Darnell Level if she can eat salads.  Plz advise.

## 2021-03-31 NOTE — Patient Instructions (Addendum)
Good to see you today.  For possible thrush - treat with nystatin swish/swallow.  Labs today.  Return as needed.  Blood pressure staying elevated - increase losartan to 75mg  daily (1.5 tablets daily).   Ischemic Colitis  Ischemic colitis is damage to the large intestine due to reduced blood flow (ischemia) to the colon. The colon is the last section of the large intestine, where stool is formed. Reduced blood flow may lead to the death of cells (necrosis) in the lining of the colon, damaging the colon and often causing bleeding. Most cases of ischemic colitis clear up in a few days with treatment. In other cases, blood flow does not improve, and parts of the colon start to die. This is extremely serious and even life-threatening. If this happens, surgery may berequired. In some cases, parts of the colon may need to be removed. What are the causes? Ischemic colitis results from a decrease in the blood supply to the colon. Many conditions can cause this, such as: Heart problems that reduce blood flow to the arteries that supply the colon. These include problems, such as coronary heart disease, peripheral vascular disease, atrial fibrillation, and congestive heart failure. Low blood pressure from: An infection that spreads to the blood (sepsis). Dehydration or bleeding (shock). Drugs that narrow blood vessels (vasoconstrictors). Sometimes the cause is not known. What increases the risk? You are more likely to develop this condition if you: Are 41 years of age or older. Are female. Have another medical condition, such as: Heart disease. Diabetes. Kidney disease that requires you to be on dialysis. A disease that causes blood clots. Are frequently constipated. Have had surgery on the heart, blood vessels (such as the aorta), or colon. Take certain medicines or drugs, such as: Medicines that suppress your immune system. Medicines that cause constipation. Illegal drugs, such as cocaine or  methamphetamines. Get an extreme amount of exercise from long-distance bike riding or running. What are the signs or symptoms? Symptoms of this condition start suddenly and may include: Dull pain, usually on the left side of the abdomen (abdominal pain). Tenderness of the abdomen or abdominal cramps. An urgent need to have a bowel movement. Loose, bloody stools with clots of dark or bright red blood. Nausea and vomiting. Fever. Weakness, fatigue, and confusion. How is this diagnosed? This condition may be diagnosed based on: Your symptoms, your medical history, and a physical exam. Giving a complete and accurate medical history is important to make an accurate diagnosis. Tests to find out more about your condition and to rule out other causes of pain and bleeding. These tests may include: Blood tests to check for clotting, blood loss, and low proteins in your blood. CT scan of the colon. A procedure to examine the inside of your colon using a scope that is passed through the rectum (colonoscopy). Colonoscopy is the most important diagnostic test. During this test, your health care provider may take a small piece of tissue from your colon to be examined under a microscope (biopsy). An X-ray may also be taken of your chest and abdomen (stomach area). How is this treated? You may be hospitalized for treatment. Treatment usually includes: Not eating or drinking anything. This allows the colon to rest. IV fluids to maintain blood pressure, regulate salts and minerals in the blood (electrolytes), and provide nutrition. Having a tube inserted into your stomach through your nose (nasogastric tube) to drain your stomach. IV antibiotic medicines. These may be used if an infection is suspected.  Stopping or changing medicines that may be causing the condition. You may need surgery if your condition is severe or if it gets worse or does not get better after a few days. Parts of the colon that will not  recover may need to be removed. In some cases, a procedure is also done to attach the healthy part of the colon to the outer wall of the abdomen to drain stool (colostomy). Follow these instructions at home: Follow instructions from your health care provider about eating or drinking restrictions. Drink enough fluid to keep your urine pale yellow. Take over-the-counter and prescription medicines only as told by your health care provider. Take your antibiotic medicine as told by your health care provider. Do not stop taking the antibiotic even if you start to feel better Return to your normal activities as told by your health care provider. Ask your health care provider what activities are safe for you. Do not use any products that contain nicotine or tobacco. These products include cigarettes, chewing tobacco, and vaping devices, such as e-cigarettes. If you need help quitting, ask your health care provider. Keep all follow-up visits. This is important. Contact a health care provider if: You have blood in your stool. You have abdominal pain or cramps. You are constipated. You have nausea or vomiting. Get help right away if: You have a moderate to large amount of loose, bloody stools with dark or bright red blood clots. You have severe abdominal pain. Your abdominal pain has not improved after 24 hours. You have a fever. You have not been able to have a bowel movement, and you are in pain and vomiting. You have shortness of breath. You are very tired (lethargic) or have confusion. Summary Ischemic colitis is damage to the large intestine due to reduced blood flow (ischemia) to the colon. Some of the symptoms of this condition include abdominal pain or tenderness, bloody stools, and an urgent need to have a bowel movement. Diagnosis usually includes a procedure to examine the inside of the colon using a scope that is passed through the rectum (colonoscopy). This information is not intended to  replace advice given to you by your health care provider. Make sure you discuss any questions you have with your healthcare provider. Document Revised: 06/11/2020 Document Reviewed: 06/11/2020 Elsevier Patient Education  [place].

## 2021-04-01 DIAGNOSIS — R600 Localized edema: Secondary | ICD-10-CM | POA: Insufficient documentation

## 2021-04-01 DIAGNOSIS — J690 Pneumonitis due to inhalation of food and vomit: Secondary | ICD-10-CM

## 2021-04-01 DIAGNOSIS — B370 Candidal stomatitis: Secondary | ICD-10-CM | POA: Insufficient documentation

## 2021-04-01 HISTORY — DX: Pneumonitis due to inhalation of food and vomit: J69.0

## 2021-04-01 LAB — BASIC METABOLIC PANEL
BUN: 11 mg/dL (ref 6–23)
CO2: 28 mEq/L (ref 19–32)
Calcium: 9.4 mg/dL (ref 8.4–10.5)
Chloride: 103 mEq/L (ref 96–112)
Creatinine, Ser: 0.88 mg/dL (ref 0.40–1.20)
GFR: 63.9 mL/min (ref >60.00)
Glucose, Bld: 135 mg/dL — ABNORMAL HIGH (ref 70–99)
Potassium: 4.1 mEq/L (ref 3.5–5.1)
Sodium: 141 mEq/L (ref 135–145)

## 2021-04-01 LAB — CBC WITH DIFFERENTIAL/PLATELET
Basophils Absolute: 0.1 10*3/uL (ref 0.0–0.1)
Basophils Relative: 1.4 % (ref 0.0–3.0)
Eosinophils Absolute: 0.2 10*3/uL (ref 0.0–0.7)
Eosinophils Relative: 1.7 % (ref 0.0–5.0)
HCT: 33.9 % — ABNORMAL LOW (ref 36.0–46.0)
Hemoglobin: 11.2 g/dL — ABNORMAL LOW (ref 12.0–15.0)
Lymphocytes Relative: 28.4 % (ref 12.0–46.0)
Lymphs Abs: 2.7 10*3/uL (ref 0.7–4.0)
MCHC: 33 g/dL (ref 30.0–36.0)
MCV: 84 fl (ref 78.0–100.0)
Monocytes Absolute: 0.8 10*3/uL (ref 0.1–1.0)
Monocytes Relative: 8.7 % (ref 3.0–12.0)
Neutro Abs: 5.6 10*3/uL (ref 1.4–7.7)
Neutrophils Relative %: 59.8 % (ref 43.0–77.0)
Platelets: 249 10*3/uL (ref 150.0–400.0)
RBC: 4.03 Mil/uL (ref 3.87–5.11)
RDW: 15.1 % (ref 11.5–15.5)
WBC: 9.4 10*3/uL (ref 4.0–10.5)

## 2021-04-01 LAB — BRAIN NATRIURETIC PEPTIDE: Pro B Natriuretic peptide (BNP): 151 pg/mL — ABNORMAL HIGH (ref 0.0–100.0)

## 2021-04-01 NOTE — Assessment & Plan Note (Signed)
BP remaining elevated. Atenolol dosing limited by heart rate. Will increase losartan to 75mg  daily. Update with home BP readings.

## 2021-04-01 NOTE — Assessment & Plan Note (Signed)
Update CBC, not on oral iron.

## 2021-04-01 NOTE — Telephone Encounter (Addendum)
I don't know of a specific diet for ischemic colitis. I think salads would be ok - unless she has abd pain after eating a certain food, would then avoid that food.

## 2021-04-01 NOTE — Assessment & Plan Note (Addendum)
Presumed LLL aspiration - pt states she received 2 separate abx for this however records only show keflex 4d course which she has since completed. Symptoms largely resolved.

## 2021-04-01 NOTE — Assessment & Plan Note (Addendum)
Continue low dose atorvastatin. Goal LDL likely <70  The 10-year ASCVD risk score Mikey Bussing DC Jr., et al., 2013) is: 32.3%   Values used to calculate the score:     Age: 75 years     Sex: Female     Is Non-Hispanic African American: No     Diabetic: No     Tobacco smoker: No     Systolic Blood Pressure: 161 mmHg     Is BP treated: Yes     HDL Cholesterol: 57.8 mg/dL     Total Cholesterol: 157 mg/dL

## 2021-04-01 NOTE — Assessment & Plan Note (Signed)
Recent hospitalization for this, largely recovered now. Colonoscopy biopsy showed ischemic colitis negative for dysplasia and malignancy. Reviewed importance of blood pressure and cholesterol control. See below.

## 2021-04-01 NOTE — Assessment & Plan Note (Signed)
Treat possible thrush with nystatin swish/swallow

## 2021-04-01 NOTE — Telephone Encounter (Signed)
Left message on vm per dpr relaying Dr. G's message.  

## 2021-04-01 NOTE — Assessment & Plan Note (Signed)
Mild - check BNP

## 2021-04-14 ENCOUNTER — Ambulatory Visit: Payer: PPO | Admitting: Cardiovascular Disease

## 2021-04-14 ENCOUNTER — Encounter: Payer: Self-pay | Admitting: Cardiovascular Disease

## 2021-04-14 ENCOUNTER — Other Ambulatory Visit: Payer: Self-pay

## 2021-04-14 VITALS — BP 110/54 | HR 64 | Ht 64.0 in | Wt 144.2 lb

## 2021-04-14 DIAGNOSIS — E782 Mixed hyperlipidemia: Secondary | ICD-10-CM

## 2021-04-14 DIAGNOSIS — I10 Essential (primary) hypertension: Secondary | ICD-10-CM | POA: Diagnosis not present

## 2021-04-14 DIAGNOSIS — R079 Chest pain, unspecified: Secondary | ICD-10-CM

## 2021-04-14 DIAGNOSIS — R7303 Prediabetes: Secondary | ICD-10-CM

## 2021-04-14 DIAGNOSIS — I739 Peripheral vascular disease, unspecified: Secondary | ICD-10-CM

## 2021-04-14 DIAGNOSIS — R002 Palpitations: Secondary | ICD-10-CM | POA: Diagnosis not present
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
## 2021-04-14 NOTE — Patient Instructions (Addendum)
Medication Instructions:  No changes  If you need a refill on your cardiac medications before your next appointment, please call your pharmacy.   Lab work: No new labs needed  Testing/Procedures: No new testing needed   Follow-Up: At [facility], you and your health needs are our priority.  As part of our continuing mission to provide you with exceptional heart care, we have created designated Provider Care Teams.  These Care Teams include your primary Cardiologist (physician) and Advanced Practice Providers (APPs -  Physician Assistants and Nurse Practitioners) who all work together to provide you with the care you need, when you need it.  You will need a follow up appointment in 6 months  Providers on your designated Care Team:   Christopher Berge, NP Ryan Dunn, PA-C Jacquelyn Visser, PA-C Cadence Furth, PA-C   COVID-19 Vaccine Information can be found at: [email or web address] For questions related to vaccine distribution or appointments, please email [email or web address] or call [phone].    

## 2021-04-15 ENCOUNTER — Other Ambulatory Visit: Payer: Self-pay | Admitting: Family Medicine

## 2021-04-16 NOTE — Telephone Encounter (Signed)
Name of Medication: Alprazolam Name of Pharmacy: [facility] Date and Quantity: 02/26/21, #40 Last Office Visit and Type: 03/31/21, hosp f/u Next Office Visit and Type: 05/20/21, AWV prt 2 Last Controlled Substance Agreement Date: 04/16/17 Last UDS: 04/06/17

## 2021-04-18 ENCOUNTER — Encounter: Payer: Self-pay | Admitting: Family Medicine

## 2021-04-18 NOTE — Telephone Encounter (Signed)
ERx 

## 2021-04-19 ENCOUNTER — Emergency Department
Admission: EM | Admit: 2021-04-19 | Discharge: 2021-04-19 | Disposition: A | Discharge status: Home / Self Care | Payer: PPO | Attending: Emergency Medicine | Admitting: Emergency Medicine

## 2021-04-19 ENCOUNTER — Emergency Department: Payer: PPO

## 2021-04-19 ENCOUNTER — Other Ambulatory Visit: Payer: Self-pay

## 2021-04-19 DIAGNOSIS — Z5321 Procedure and treatment not carried out due to patient leaving prior to being seen by health care provider: Secondary | ICD-10-CM | POA: Diagnosis not present

## 2021-04-19 DIAGNOSIS — M7989 Other specified soft tissue disorders: Secondary | ICD-10-CM | POA: Insufficient documentation

## 2021-04-19 DIAGNOSIS — R079 Chest pain, unspecified: Secondary | ICD-10-CM | POA: Diagnosis not present

## 2021-04-19 DIAGNOSIS — R112 Nausea with vomiting, unspecified: Secondary | ICD-10-CM | POA: Diagnosis not present

## 2021-04-19 LAB — CBC
HCT: 41.3 % (ref 36.0–46.0)
Hemoglobin: 12.9 g/dL (ref 12.0–15.0)
MCH: 27.1 pg (ref 26.0–34.0)
MCHC: 31.2 g/dL (ref 30.0–36.0)
MCV: 86.8 fL (ref 80.0–100.0)
Platelets: 185 10*3/uL (ref 150–400)
RBC: 4.76 MIL/uL (ref 3.87–5.11)
RDW: 13.9 % (ref 11.5–15.5)
WBC: 10.4 10*3/uL (ref 4.0–10.5)
nRBC: 0 % (ref 0.0–0.2)

## 2021-04-19 LAB — BASIC METABOLIC PANEL
Anion gap: 9 (ref 5–15)
BUN: 16 mg/dL (ref 8–23)
CO2: 20 mmol/L — ABNORMAL LOW (ref 22–32)
Calcium: 9 mg/dL (ref 8.9–10.3)
Chloride: 105 mmol/L (ref 98–111)
Creatinine, Ser: 1.08 mg/dL — ABNORMAL HIGH (ref 0.44–1.00)
GFR, Estimated: 53 mL/min — ABNORMAL LOW (ref >60)
Glucose, Bld: 178 mg/dL — ABNORMAL HIGH (ref 70–99)
Potassium: 3.6 mmol/L (ref 3.5–5.1)
Sodium: 134 mmol/L — ABNORMAL LOW (ref 135–145)

## 2021-04-19 LAB — TROPONIN I (HIGH SENSITIVITY)
Troponin I (High Sensitivity): 3 ng/L (ref <18)
Troponin I (High Sensitivity): 3 ng/L (ref <18)

## 2021-04-19 NOTE — ED Notes (Signed)
Lab here to draw repeat trop

## 2021-04-19 NOTE — ED Notes (Signed)
Pt states she does not want to wait any longer, pt states she has zofran and bentyl to take at home.Pt called husband who came to pick her up.

## 2021-04-19 NOTE — ED Triage Notes (Signed)
Pt to ED for possible allergic rxn after getting bit by approx 5-6 ants this afternoon. Denies known allergy to ants.  Reports after bit, started having chest pain, nausea, and felt swelling to lip and hands.  Took zyrtec PTA No swelling noted to facial area  Reports continued centralized cp and nausea. Denies shob

## 2021-04-22 ENCOUNTER — Telehealth: Payer: Self-pay

## 2021-04-22 NOTE — Chronic Care Management (AMB) (Addendum)
Chronic Care Management Pharmacy Assistant   Name: Bianca Fry  MRN: [id number] DOB: 31-Oct-1945  Bianca Fry is an 75 y.o. year old female who presents for her initial CCM visit with the clinical pharmacist.  Reason for Encounter: Initial Questions   Conditions to be addressed/monitored: CAD, HTN, and HLD   Recent office visits:  03/31/21 - Dr.Gutierrez PCP increase losartan to 75mg  daily.Treat possible thrush with nystatin swish/swallow Labs ordered  02/19/21 - Family Medicine telemedicine started Levaquin 500mg  daily for a week and azelastine nasal spray one puff each nostril daily  01/28/21 - PCP increase atenolol to BID dosing 01/08/21 - PCP use tylenol for discomfort, up to 500mg  three times daily 11/01/20 - PCP mobic 7.5mg  daily for 1-[redacted] wks along with PRN tylenol, robaxin, restart gabapentin 100mg  TID, discussed topical voltaren gel use. try bentyl daily with largest meal for a few weeks. Switch probiotic to align daily for a few weeks.  Recent consult visits:  04/14/21 - Cardiology no medication changes 02/12/21 - Cardiology no medication changes 02/10/21 - Optometry no data found 12/23/20 - [facility] Orthopedic advice for hip pain  12/16/20 - Cardiology  duplex scan   Hospital visits:  04/19/21 [facility] ED chest pain  03/18/21 thru 03/21/21  [facility] Retal Bleed  then [facility]  colonoscopy procedure 01/17/21 - [facility]  Headache - Brain MRI  follow up with PCP 5 days   Medications: Outpatient Encounter Medications as of 04/22/2021  Medication Sig   ALPRAZolam (XANAX) 0.5 MG tablet TAKE ONE TABLET BY MOUTH TWICE A DAY AS NEEDED FOR ANXIETY   atenolol (TENORMIN) 25 MG tablet Take 1 tablet (25 mg total) by mouth 2 (two) times daily.   atorvastatin (LIPITOR) 10 MG tablet Take 1 tablet (10 mg total) by mouth daily.   Calcium Carb-Cholecalciferol (CALCIUM-VITAMIN D) 600-400 MG-UNIT TABS Take 1 tablet by mouth daily.   cephALEXin (KEFLEX) 250 MG capsule Take by mouth daily. Takes to  prevent UTIs   cholecalciferol (VITAMIN D) 25 MCG (1000 UNIT) tablet Take 1,000 Units by mouth daily.   dicyclomine (BENTYL) 10 MG capsule Take 10 mg by mouth 2 (two) times daily as needed for spasms.   estradiol (ESTRACE) 0.1 MG/GM vaginal cream Place 0.5 g vaginally 3 (three) times a week.   fexofenadine (ALLEGRA) 180 MG tablet Take 180 mg by mouth daily as needed for allergies.   losartan (COZAAR) 50 MG tablet Take 1.5 tablets (75 mg total) by mouth daily.   omeprazole (PRILOSEC) 40 MG capsule Take 1 capsule by mouth once daily   ondansetron (ZOFRAN) 4 MG tablet Take 1 tablet (4 mg total) by mouth every 8 (eight) hours as needed for nausea or vomiting.   valACYclovir (VALTREX) 1000 MG tablet Take 2,000 mg by mouth in the morning and at bedtime. (for one day if needed for flares)   No facility-administered encounter medications on file as of 04/22/2021.    Lab Results  Component Value Date/Time   HGBA1C 6.6 (H) 05/16/2020 10:19 AM   HGBA1C 6.4 05/09/2019 09:03 AM   MICROALBUR <0.7 05/09/2019 09:03 AM     BP Readings from Last 3 Encounters:  04/19/21 104/63  04/14/21 (!) 110/54  03/31/21 (!) 156/66      Have you seen any other providers since your last visit with PCP? Yes  04/14/21 - Cardiology follow up from ED visit no medication changes  Any changes in your medications or health? Yes PCP has increased Losartan dose and atenolol  dose over the past few months for better blood pressure control  Any side effects from any medications? No  Do you have an symptoms or problems not managed by your medications? No  Any concerns about your health right now? No The patient reports she has lost about 6 Lbs recently and is feeling well  Has your provider asked that you check blood pressure, blood sugar, or follow special diet at home? The patient reports she has a BP monitor and will take readings at home.She is not on any special diet  Do you get any type of exercise on a regular basis?  No Since April 2022 the patient reports intestinal issues and covid-19 has kept her from doing any kind of regular exercise  Can you think of a goal you would like to reach for your health? No  Do you have any problems getting your medications? No  Is there anything that you would like to discuss during the appointment? No   Bianca Fry was reminded to have all medications, supplements and any blood glucose and blood pressure readings available for review with Debbora Dus, Pharm. D, at her telephone visit on 05/07/2021 at 3:30pm .    Star Rating Drugs:  Medication:  Last Fill: Day Supply Losartan 50mg  02/14/21 90 Atorvastatin 10mg  11/22/20  90   Follow-Up:  Pharmacist Review  Debbora Dus, CPP notified  Avel Sensor [facility] Clinical Pharmacy Assistant [phone]  I have reviewed the care management and care coordination activities outlined in this encounter and I am certifying that I agree with the content of this note. No further action required.  Debbora Dus, PharmD Clinical Pharmacist [facility] [phone]

## 2021-04-24 ENCOUNTER — Telehealth: Payer: Self-pay | Admitting: Obstetrics and Gynecology

## 2021-04-24 ENCOUNTER — Encounter: Payer: Self-pay | Admitting: Obstetrics and Gynecology

## 2021-04-24 ENCOUNTER — Ambulatory Visit: Payer: PPO | Admitting: Obstetrics and Gynecology

## 2021-04-24 ENCOUNTER — Other Ambulatory Visit: Payer: Self-pay

## 2021-04-24 ENCOUNTER — Ambulatory Visit (INDEPENDENT_AMBULATORY_CARE_PROVIDER_SITE_OTHER): Payer: PPO | Admitting: Obstetrics and Gynecology

## 2021-04-24 VITALS — BP 162/68 | HR 66 | Ht 63.0 in | Wt 145.0 lb

## 2021-04-24 DIAGNOSIS — N6320 Unspecified lump in the left breast, unspecified quadrant: Secondary | ICD-10-CM

## 2021-04-24 DIAGNOSIS — M8580 Other specified disorders of bone density and structure, unspecified site: Secondary | ICD-10-CM

## 2021-04-24 DIAGNOSIS — Z9289 Personal history of other medical treatment: Secondary | ICD-10-CM

## 2021-04-24 DIAGNOSIS — M810 Age-related osteoporosis without current pathological fracture: Secondary | ICD-10-CM | POA: Diagnosis not present

## 2021-04-24 DIAGNOSIS — Z1239 Encounter for other screening for malignant neoplasm of breast: Secondary | ICD-10-CM

## 2021-04-24 DIAGNOSIS — N952 Postmenopausal atrophic vaginitis: Secondary | ICD-10-CM

## 2021-04-24 DIAGNOSIS — Z008 Encounter for other general examination: Secondary | ICD-10-CM

## 2021-04-24 DIAGNOSIS — B009 Herpesviral infection, unspecified: Secondary | ICD-10-CM

## 2021-04-24 DIAGNOSIS — Z01411 Encounter for gynecological examination (general) (routine) with abnormal findings: Secondary | ICD-10-CM

## 2021-04-24 MED ORDER — PREMARIN 0.625 MG/GM VA CREA: 1.0000 | TOPICAL_CREAM | Freq: Every day | VAGINAL | 2 refills | Status: DC
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
## 2021-04-24 NOTE — Patient Instructions (Signed)

## 2021-04-24 NOTE — Telephone Encounter (Signed)
Please schedule dx left mammogram and left breast US at the [facility]. Patient has a mass versus ridge of breast tissue at 6:00 just under the areola.  Area is 1.5 cm.

## 2021-04-25 NOTE — Telephone Encounter (Signed)
Attempted to contact patient to reschedule initial visit as she is unavailable on 7/11 and I am unavailable on 7/20. Left VM with contact information.  Debbora Dus, PharmD Clinical Pharmacist [facility] [phone]

## 2021-04-25 NOTE — Telephone Encounter (Signed)
Patient scheduled on 05/27/21 @ 9:00 am at the breast center. Left message for patient to call.

## 2021-04-27 ENCOUNTER — Encounter: Payer: Self-pay | Admitting: Obstetrics and Gynecology

## 2021-04-28 ENCOUNTER — Telehealth: Payer: PPO

## 2021-04-29 NOTE — Telephone Encounter (Signed)
Left message for patient to call again. 

## 2021-05-01 ENCOUNTER — Encounter: Payer: Self-pay | Admitting: Internal Medicine

## 2021-05-01 NOTE — Telephone Encounter (Signed)
Patient informed with below note. 

## 2021-05-02 ENCOUNTER — Encounter: Payer: Self-pay | Admitting: Emergency Medicine

## 2021-05-02 ENCOUNTER — Ambulatory Visit
Admission: EM | Admit: 2021-05-02 | Discharge: 2021-05-02 | Disposition: A | Discharge status: Home / Self Care | Payer: PPO | Attending: Emergency Medicine | Admitting: Emergency Medicine

## 2021-05-02 ENCOUNTER — Other Ambulatory Visit: Payer: Self-pay

## 2021-05-02 DIAGNOSIS — B349 Viral infection, unspecified: Secondary | ICD-10-CM | POA: Diagnosis not present

## 2021-05-02 DIAGNOSIS — I10 Essential (primary) hypertension: Secondary | ICD-10-CM

## 2021-05-02 LAB — POCT RAPID STREP A (OFFICE): Rapid Strep A Screen: NEGATIVE

## 2021-05-02 NOTE — ED Triage Notes (Signed)
Patient c/o sore throat, bilateral ear pain, and cough x 2 days.   Patient denies fever at home.   Patient endorses that she's lost her voice.   Patient took an at home COVID test with a negative result.   Patient has taken Tylenol and Mucinex with no relief of symptoms.

## 2021-05-02 NOTE — Discharge Instructions (Addendum)
Your rapid strep test is negative.  A throat culture is pending; we will call you if it is positive requiring treatment.    Your COVID and Influenza tests are pending.  You should self quarantine until the test results are back.    Take Tylenol as needed for fever or discomfort.  Rest and keep yourself hydrated.    Your blood pressure is elevated today at 157/76.  Please have this rechecked by your primary care provider in 2-4 weeks.
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
## 2021-05-04 LAB — COVID-19, FLU A+B NAA
Influenza A, NAA: NOT DETECTED
Influenza B, NAA: NOT DETECTED
SARS-CoV-2, NAA: NOT DETECTED

## 2021-05-08 DIAGNOSIS — R059 Cough, unspecified: Secondary | ICD-10-CM | POA: Diagnosis not present

## 2021-05-08 DIAGNOSIS — R490 Dysphonia: Secondary | ICD-10-CM | POA: Diagnosis not present

## 2021-05-11 ENCOUNTER — Other Ambulatory Visit: Payer: Self-pay | Admitting: Family Medicine

## 2021-05-12 ENCOUNTER — Other Ambulatory Visit: Payer: Self-pay | Admitting: Cardiovascular Disease

## 2021-05-15 ENCOUNTER — Ambulatory Visit (INDEPENDENT_AMBULATORY_CARE_PROVIDER_SITE_OTHER): Payer: PPO

## 2021-05-15 DIAGNOSIS — Z0000 Encounter for general adult medical examination without abnormal findings: Secondary | ICD-10-CM

## 2021-05-15 NOTE — Progress Notes (Signed)
PCP notes:  Health Maintenance: Tdap-insurance   Abnormal Screenings: none   Patient concerns: none   Nurse concerns: none   Next PCP appt.:  05/20/2021 @ 8:30 am

## 2021-05-15 NOTE — Patient Instructions (Signed)
Ms. Bianca Fry , Thank you for taking time to come for your Medicare Wellness Visit. I appreciate your ongoing commitment to your health goals. Please review the following plan we discussed and let me know if I can assist you in the future.   Screening recommendations/referrals: Colonoscopy: Up to date, completed 03/20/2021, no longer required  Mammogram: Up to date, completed 09/09/2020, due 08/2022 Bone Density: Up to date, completed 10/05/2019, due 09/2021 Recommended yearly ophthalmology/optometry visit for glaucoma screening and checkup Recommended yearly dental visit for hygiene and checkup  Vaccinations: Influenza vaccine: Up to date, completed 07/31/2020, due 05/2021 Pneumococcal vaccine: Completed series Tdap vaccine: decline-insurance Shingles vaccine: Completed series   Covid-19: completed 3 vaccines   Advanced directives: Advance directive discussed with you today. Even though you declined this today please call our office should you change your mind and we can give you the proper paperwork for you to fill out.  Conditions/risks identified: hypertension, hyperlipidemia   Next appointment: Follow up in one year for your annual wellness visit    Preventive Care 76 Years and Older, Female Preventive care refers to lifestyle choices and visits with your health care provider that can promote health and wellness. What does preventive care include? A yearly physical exam. This is also called an annual well check. Dental exams once or twice a year. Routine eye exams. Ask your health care provider how often you should have your eyes checked. Personal lifestyle choices, including: Daily care of your teeth and gums. Regular physical activity. Eating a healthy diet. Avoiding tobacco and drug use. Limiting alcohol use. Practicing safe sex. Taking low-dose aspirin every day. Taking vitamin and mineral supplements as recommended by your health care provider. What happens during an annual  well check? The services and screenings done by your health care provider during your annual well check will depend on your age, overall health, lifestyle risk factors, and family history of disease. Counseling  Your health care provider may ask you questions about your: Alcohol use. Tobacco use. Drug use. Emotional well-being. Home and relationship well-being. Sexual activity. Eating habits. History of falls. Memory and ability to understand (cognition). Work and work Statistician. Reproductive health. Screening  You may have the following tests or measurements: Height, weight, and BMI. Blood pressure. Lipid and cholesterol levels. These may be checked every 5 years, or more frequently if you are over 76 years old. Skin check. Lung cancer screening. You may have this screening every year starting at age 35 if you have a 30-pack-year history of smoking and currently smoke or have quit within the past 15 years. Fecal occult blood test (FOBT) of the stool. You may have this test every year starting at age 76. Flexible sigmoidoscopy or colonoscopy. You may have a sigmoidoscopy every 5 years or a colonoscopy every 10 years starting at age 76. Hepatitis C blood test. Hepatitis B blood test. Sexually transmitted disease (STD) testing. Diabetes screening. This is done by checking your blood sugar (glucose) after you have not eaten for a while (fasting). You may have this done every 1-3 years. Bone density scan. This is done to screen for osteoporosis. You may have this done starting at age 76. Mammogram. This may be done every 1-2 years. Talk to your health care provider about how often you should have regular mammograms. Talk with your health care provider about your test results, treatment options, and if necessary, the need for more tests. Vaccines  Your health care provider may recommend certain vaccines, such as: Influenza  vaccine. This is recommended every year. Tetanus, diphtheria,  and acellular pertussis (Tdap, Td) vaccine. You may need a Td booster every 10 years. Zoster vaccine. You may need this after age 76. Pneumococcal 13-valent conjugate (PCV13) vaccine. One dose is recommended after age 76. Pneumococcal polysaccharide (PPSV23) vaccine. One dose is recommended after age 76. Talk to your health care provider about which screenings and vaccines you need and how often you need them. This information is not intended to replace advice given to you by your health care provider. Make sure you discuss any questions you have with your health care provider. Document Released: 11/01/2015 Document Revised: 06/24/2016 Document Reviewed: 08/06/2015 Elsevier Interactive Patient Education  2017 [place] Prevention in the Home Falls can cause injuries. They can happen to people of all ages. There are many things you can do to make your home safe and to help prevent falls. What can I do on the outside of my home? Regularly fix the edges of walkways and driveways and fix any cracks. Remove anything that might make you trip as you walk through a door, such as a raised step or threshold. Trim any bushes or trees on the path to your home. Use bright outdoor lighting. Clear any walking paths of anything that might make someone trip, such as rocks or tools. Regularly check to see if handrails are loose or broken. Make sure that both sides of any steps have handrails. Any raised decks and porches should have guardrails on the edges. Have any leaves, snow, or ice cleared regularly. Use sand or salt on walking paths during winter. Clean up any spills in your garage right away. This includes oil or grease spills. What can I do in the bathroom? Use night lights. Install grab bars by the toilet and in the tub and shower. Do not use towel bars as grab bars. Use non-skid mats or decals in the tub or shower. If you need to sit down in the shower, use a plastic, non-slip  stool. Keep the floor dry. Clean up any water that spills on the floor as soon as it happens. Remove soap buildup in the tub or shower regularly. Attach bath mats securely with double-sided non-slip rug tape. Do not have throw rugs and other things on the floor that can make you trip. What can I do in the bedroom? Use night lights. Make sure that you have a light by your bed that is easy to reach. Do not use any sheets or blankets that are too big for your bed. They should not hang down onto the floor. Have a firm chair that has side arms. You can use this for support while you get dressed. Do not have throw rugs and other things on the floor that can make you trip. What can I do in the kitchen? Clean up any spills right away. Avoid walking on wet floors. Keep items that you use a lot in easy-to-reach places. If you need to reach something above you, use a strong step stool that has a grab bar. Keep electrical cords out of the way. Do not use floor polish or wax that makes floors slippery. If you must use wax, use non-skid floor wax. Do not have throw rugs and other things on the floor that can make you trip. What can I do with my stairs? Do not leave any items on the stairs. Make sure that there are handrails on both sides of the stairs and use them. Fix  handrails that are broken or loose. Make sure that handrails are as long as the stairways. Check any carpeting to make sure that it is firmly attached to the stairs. Fix any carpet that is loose or worn. Avoid having throw rugs at the top or bottom of the stairs. If you do have throw rugs, attach them to the floor with carpet tape. Make sure that you have a light switch at the top of the stairs and the bottom of the stairs. If you do not have them, ask someone to add them for you. What else can I do to help prevent falls? Wear shoes that: Do not have high heels. Have rubber bottoms. Are comfortable and fit you well. Are closed at the  toe. Do not wear sandals. If you use a stepladder: Make sure that it is fully opened. Do not climb a closed stepladder. Make sure that both sides of the stepladder are locked into place. Ask someone to hold it for you, if possible. Clearly mark and make sure that you can see: Any grab bars or handrails. First and last steps. Where the edge of each step is. Use tools that help you move around (mobility aids) if they are needed. These include: Canes. Walkers. Scooters. Crutches. Turn on the lights when you go into a dark area. Replace any light bulbs as soon as they burn out. Set up your furniture so you have a clear path. Avoid moving your furniture around. If any of your floors are uneven, fix them. If there are any pets around you, be aware of where they are. Review your medicines with your doctor. Some medicines can make you feel dizzy. This can increase your chance of falling. Ask your doctor what other things that you can do to help prevent falls. This information is not intended to replace advice given to you by your health care provider. Make sure you discuss any questions you have with your health care provider. Document Released: 08/01/2009 Document Revised: 03/12/2016 Document Reviewed: 11/09/2014 Elsevier Interactive Patient Education  2017 [employer].
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
## 2021-05-16 ENCOUNTER — Other Ambulatory Visit: Payer: PPO

## 2021-05-16 DIAGNOSIS — M1611 Unilateral primary osteoarthritis, right hip: Secondary | ICD-10-CM | POA: Diagnosis not present

## 2021-05-18 ENCOUNTER — Other Ambulatory Visit: Payer: Self-pay | Admitting: Family Medicine

## 2021-05-18 DIAGNOSIS — M1611 Unilateral primary osteoarthritis, right hip: Secondary | ICD-10-CM | POA: Insufficient documentation

## 2021-05-18 DIAGNOSIS — D509 Iron deficiency anemia, unspecified: Secondary | ICD-10-CM

## 2021-05-18 DIAGNOSIS — R7303 Prediabetes: Secondary | ICD-10-CM

## 2021-05-18 DIAGNOSIS — M169 Osteoarthritis of hip, unspecified: Secondary | ICD-10-CM | POA: Insufficient documentation

## 2021-05-18 DIAGNOSIS — E785 Hyperlipidemia, unspecified: Secondary | ICD-10-CM

## 2021-05-18 DIAGNOSIS — E559 Vitamin D deficiency, unspecified: Secondary | ICD-10-CM

## 2021-05-19 ENCOUNTER — Other Ambulatory Visit: Payer: Self-pay

## 2021-05-19 ENCOUNTER — Other Ambulatory Visit (INDEPENDENT_AMBULATORY_CARE_PROVIDER_SITE_OTHER): Payer: PPO

## 2021-05-19 DIAGNOSIS — R7303 Prediabetes: Secondary | ICD-10-CM | POA: Diagnosis not present

## 2021-05-19 DIAGNOSIS — E785 Hyperlipidemia, unspecified: Secondary | ICD-10-CM

## 2021-05-19 DIAGNOSIS — E559 Vitamin D deficiency, unspecified: Secondary | ICD-10-CM

## 2021-05-19 DIAGNOSIS — D509 Iron deficiency anemia, unspecified: Secondary | ICD-10-CM

## 2021-05-19 LAB — LIPID PANEL
Cholesterol: 151 mg/dL (ref 0–200)
HDL: 53 mg/dL (ref >39.00)
LDL Cholesterol: 61 mg/dL (ref 0–99)
NonHDL: 98.03
Total CHOL/HDL Ratio: 3
Triglycerides: 183 mg/dL — ABNORMAL HIGH (ref 0.0–149.0)
VLDL: 36.6 mg/dL (ref 0.0–40.0)

## 2021-05-19 LAB — COMPREHENSIVE METABOLIC PANEL
ALT: 11 U/L (ref 0–35)
AST: 13 U/L (ref 0–37)
Albumin: 4 g/dL (ref 3.5–5.2)
Alkaline Phosphatase: 76 U/L (ref 39–117)
BUN: 16 mg/dL (ref 6–23)
CO2: 28 mEq/L (ref 19–32)
Calcium: 9.4 mg/dL (ref 8.4–10.5)
Chloride: 104 mEq/L (ref 96–112)
Creatinine, Ser: 0.85 mg/dL (ref 0.40–1.20)
GFR: 66.55 mL/min (ref >60.00)
Glucose, Bld: 107 mg/dL — ABNORMAL HIGH (ref 70–99)
Potassium: 4.2 mEq/L (ref 3.5–5.1)
Sodium: 139 mEq/L (ref 135–145)
Total Bilirubin: 0.4 mg/dL (ref 0.2–1.2)
Total Protein: 6.8 g/dL (ref 6.0–8.3)

## 2021-05-19 LAB — IBC PANEL
Iron: 54 ug/dL (ref 42–145)
Saturation Ratios: 14.4 % — ABNORMAL LOW (ref 20.0–50.0)
Transferrin: 267 mg/dL (ref 212.0–360.0)

## 2021-05-19 LAB — FERRITIN: Ferritin: 14 ng/mL (ref 10.0–291.0)

## 2021-05-19 LAB — HEMOGLOBIN A1C: Hgb A1c MFr Bld: 6.5 % (ref 4.6–6.5)

## 2021-05-19 LAB — VITAMIN D 25 HYDROXY (VIT D DEFICIENCY, FRACTURES): VITD: 40.93 ng/mL (ref 30.00–100.00)

## 2021-05-20 ENCOUNTER — Ambulatory Visit (INDEPENDENT_AMBULATORY_CARE_PROVIDER_SITE_OTHER): Payer: PPO | Admitting: Family Medicine

## 2021-05-20 ENCOUNTER — Other Ambulatory Visit: Payer: Self-pay

## 2021-05-20 ENCOUNTER — Encounter: Payer: Self-pay | Admitting: Family Medicine

## 2021-05-20 VITALS — BP 132/70 | HR 70 | Temp 97.4°F | Ht 63.5 in | Wt 144.5 lb

## 2021-05-20 DIAGNOSIS — E559 Vitamin D deficiency, unspecified: Secondary | ICD-10-CM

## 2021-05-20 DIAGNOSIS — K559 Vascular disorder of intestine, unspecified: Secondary | ICD-10-CM

## 2021-05-20 DIAGNOSIS — E785 Hyperlipidemia, unspecified: Secondary | ICD-10-CM

## 2021-05-20 DIAGNOSIS — K582 Mixed irritable bowel syndrome: Secondary | ICD-10-CM

## 2021-05-20 DIAGNOSIS — I10 Essential (primary) hypertension: Secondary | ICD-10-CM | POA: Diagnosis not present

## 2021-05-20 DIAGNOSIS — Z7189 Other specified counseling: Secondary | ICD-10-CM | POA: Diagnosis not present

## 2021-05-20 DIAGNOSIS — N390 Urinary tract infection, site not specified: Secondary | ICD-10-CM

## 2021-05-20 DIAGNOSIS — D509 Iron deficiency anemia, unspecified: Secondary | ICD-10-CM

## 2021-05-20 DIAGNOSIS — K2100 Gastro-esophageal reflux disease with esophagitis, without bleeding: Secondary | ICD-10-CM | POA: Diagnosis not present

## 2021-05-20 DIAGNOSIS — N3946 Mixed incontinence: Secondary | ICD-10-CM

## 2021-05-20 DIAGNOSIS — M810 Age-related osteoporosis without current pathological fracture: Secondary | ICD-10-CM

## 2021-05-20 DIAGNOSIS — R7303 Prediabetes: Secondary | ICD-10-CM | POA: Diagnosis not present

## 2021-05-20 DIAGNOSIS — Z0000 Encounter for general adult medical examination without abnormal findings: Secondary | ICD-10-CM | POA: Diagnosis not present

## 2021-05-20 DIAGNOSIS — I6522 Occlusion and stenosis of left carotid artery: Secondary | ICD-10-CM | POA: Diagnosis not present

## 2021-05-20 DIAGNOSIS — I771 Stricture of artery: Secondary | ICD-10-CM

## 2021-05-20 MED ORDER — IRON (FERROUS SULFATE) 325 (65 FE) MG PO TABS: 325.0000 mg | ORAL_TABLET | ORAL | Status: DC

## 2021-05-20 MED ORDER — EPINEPHRINE 0.3 MG/0.3ML IJ SOAJ: 0.3000 mg | INTRAMUSCULAR | 1 refills | Status: DC | PRN

## 2021-05-20 MED ORDER — ONDANSETRON HCL 4 MG PO TABS: 4.0000 mg | ORAL_TABLET | Freq: Three times a day (TID) | ORAL | 0 refills | Status: DC | PRN

## 2021-05-20 NOTE — Assessment & Plan Note (Signed)
Latest [place] reviewed.  R subclavian stenosis - normal BPs bilateral arms today.

## 2021-05-20 NOTE — Assessment & Plan Note (Signed)
Preventative protocols reviewed and updated unless pt declined. Discussed healthy diet and lifestyle.  

## 2021-05-20 NOTE — Assessment & Plan Note (Signed)
Continue uro f/u.  

## 2021-05-20 NOTE — Assessment & Plan Note (Signed)
Advanced directive discussion - Has not set up. Would want husband to be HCPOA. Packet previously provided.

## 2021-05-20 NOTE — Assessment & Plan Note (Signed)
Did not tolerate fosamax.  Continue calcium, vit D, regular walking.

## 2021-05-20 NOTE — Assessment & Plan Note (Signed)
Continue daily omeprazole.

## 2021-05-20 NOTE — Assessment & Plan Note (Signed)
Continue 1000 IU vit D daily.

## 2021-05-20 NOTE — Assessment & Plan Note (Signed)
Levels in diet controlled diabetes range - recheck in 6 months.

## 2021-05-20 NOTE — Assessment & Plan Note (Signed)
Chronic, stable on higher losartan dose - continue.

## 2021-05-20 NOTE — Assessment & Plan Note (Signed)
Stable period.  

## 2021-05-20 NOTE — Progress Notes (Signed)
Patient ID: [name], female    DOB: Oct 21, 1945, 75 y.o.   MRN: [id number]  This visit was conducted in person.  BP 132/70   Pulse 70   Temp (!) 97.4 F (36.3 C) (Temporal)   Ht 5' 3.5" (1.613 m)   Wt 144 lb 8 oz (65.5 kg)   LMP 10/20/1979 (Within Years)   SpO2 96%   BMI 25.20 kg/m    CC: CPE Subjective:   HPI: Bianca Fry is a 75 y.o. female presenting on 05/20/2021 for Annual Exam (Prt 2. )  Saw health advisor last week for medicare wellness visit. Note reviewed.    No results found.  Flowsheet Row Clinical Support from 05/15/2021 in [place] [facility] [place]  PHQ-2 Total Score 0       Fall Risk  05/15/2021 02/19/2021 05/14/2020 09/13/2019 05/05/2019  Falls in the past year? 0 0 0 0 0  Comment - - - Emmi Telephone Survey: data to providers prior to load -  Number falls in past yr: 0 0 0 - -  Injury with Fall? 0 0 0 - -  Risk Factor Category  - - - - -  Comment - - - - -  Risk for fall due to : Medication side effect - Medication side effect - Medication side effect  Follow up Falls evaluation completed;Falls prevention discussed Falls evaluation completed Falls evaluation completed;Falls prevention discussed - Falls prevention discussed;Falls evaluation completed    Got into black ants 04/2021 had reaction including rash to arms at site of bites as well as lip swelling and nausea/vomiting, some chest pains. Developed pustules at site of rash. No dyspnea or chest tightness or low blood pressures. Doesn't think they were fire ants.   Known L carotid stenosis. On losartan and lipitor.    Chronic UTI - sees urology Amalia Hailey) yearly on keflex daily.   Losartan dose increased to '75mg'$  most recently.   Recent URI seen by [facility] with persistent hoarseness so saw ENT - this improved. Treated with azithromycin.    Preventative: COLONOSCOPY Date: 03/2015 mod diverticulosis with focal colitis [facility]) rpt 10 yrs  Colonoscopy 03/2021 - ischemic colitis [facility])  Breast  cancer screening - mammo 08/2020 [facility] - upcoming detailed L breast mammogram/US next week for palpated spot.  Well woman exam - yearly GYN Dr Josefa Half (04/2021). S/p hysterectomy. On premarin cream.  DEXA 01/2015 - T score -2.3 R hip, -1.6 spine DEXA 09/2019 -  T score -2.3 at R hip, -1.5 spine, increased fracture risk. Continues calcium and vitamin D supplement. Hip pain to bisphosphonate. Encouraged regular weight bearing exercises. Ongoing R hip pain - sees ortho.  Flu shot yearly  [place] 11/2019, 12/2019, booster 10/2020  Tdap 2012  Pneumovax 2010, prevnar 2015, pnemovax 03/2016  zostavax - 07/2010  Shingrix - 01/2018, 04/2018  Advanced directive discussion - Has not set up. Would want husband to be HCPOA. Packet previously provided.  Seat belt use discussed.  Sunscreen use discussed, no changing moles on skin, sees derm Non smoker  Alcohol - none  Dentist - Q6 mo  Eye exam - yearly  Bowel - chronic, irregular  Bladder - no incontinence - doing well on keflex   Daily caffeine Lives with husband and 1 dog and 3 cats Occupation: retired, was in [employer] Activity: walking 20-30 min/day  Diet: good water, fruits/vegetables daily      Relevant past medical, surgical, family and social history  reviewed and updated as indicated. Interim medical history since our last visit reviewed. Allergies and medications reviewed and updated. Outpatient Medications Prior to Visit  Medication Sig Dispense Refill   ALPRAZolam (XANAX) 0.5 MG tablet TAKE ONE TABLET BY MOUTH TWICE A DAY AS NEEDED FOR ANXIETY 40 tablet 0   atenolol (TENORMIN) 25 MG tablet Take 1 tablet (25 mg total) by mouth 2 (two) times daily. 180 tablet 1   atorvastatin (LIPITOR) 10 MG tablet TAKE ONE TABLET BY MOUTH ONE TIME DAILY 90 tablet 2   Calcium Carb-Cholecalciferol (CALCIUM-VITAMIN D) 600-400 MG-UNIT TABS Take 1 tablet by mouth daily.     cephALEXin (KEFLEX) 250 MG capsule Take by mouth  daily. Takes to prevent UTIs     cholecalciferol (VITAMIN D) 25 MCG (1000 UNIT) tablet Take 1,000 Units by mouth daily.     conjugated estrogens (PREMARIN) vaginal cream Place 1 Applicatorful vaginally daily. Use 1/2 g vaginally two or three times per week as needed to maintain symptom relief. 30 g 2   dicyclomine (BENTYL) 10 MG capsule Take 10 mg by mouth 2 (two) times daily as needed for spasms. 90 capsule    fexofenadine (ALLEGRA) 180 MG tablet Take 180 mg by mouth daily as needed for allergies.     lactobacillus acidophilus (BACID) TABS tablet Take 2 tablets by mouth daily.     losartan (COZAAR) 50 MG tablet Take 1.5 tablets (75 mg total) by mouth daily. 135 tablet 3   meloxicam (MOBIC) 7.5 MG tablet Take 7.5 mg by mouth daily.     omeprazole (PRILOSEC) 40 MG capsule TAKE ONE CAPSULE BY MOUTH ONE TIME DAILY 90 capsule 0   valACYclovir (VALTREX) 1000 MG tablet Take 2,000 mg by mouth in the morning and at bedtime. (for one day if needed for flares)     ondansetron (ZOFRAN) 4 MG tablet Take 1 tablet (4 mg total) by mouth every 8 (eight) hours as needed for nausea or vomiting.     No facility-administered medications prior to visit.     Per HPI unless specifically indicated in ROS section below Review of Systems  Constitutional:  Negative for activity change, appetite change, chills, fatigue, fever and unexpected weight change.  HENT:  Positive for sore throat. Negative for hearing loss.   Eyes:  Negative for visual disturbance.  Respiratory:  Positive for cough (recent URI). Negative for chest tightness, shortness of breath and wheezing.   Cardiovascular:  Negative for chest pain, palpitations and leg swelling.  Gastrointestinal:  Negative for abdominal distention, abdominal pain, blood in stool, constipation, diarrhea, nausea and vomiting.  Genitourinary:  Negative for difficulty urinating and hematuria.  Musculoskeletal:  Negative for arthralgias, myalgias and neck pain.  Skin:  Negative  for rash.  Neurological:  Negative for dizziness, seizures, syncope and headaches.  Hematological:  Negative for adenopathy. Does not bruise/bleed easily.  Psychiatric/Behavioral:  Negative for dysphoric mood. The patient is not nervous/anxious.    Objective:  BP 132/70   Pulse 70   Temp (!) 97.4 F (36.3 C) (Temporal)   Ht 5' 3.5" (1.613 m)   Wt 144 lb 8 oz (65.5 kg)   LMP 10/20/1979 (Within Years)   SpO2 96%   BMI 25.20 kg/m   Wt Readings from Last 3 Encounters:  05/20/21 144 lb 8 oz (65.5 kg)  04/24/21 145 lb (65.8 kg)  04/19/21 143 lb 4.8 oz (65 kg)      Physical Exam Vitals and nursing note reviewed.  Constitutional:  Appearance: Normal appearance. She is not ill-appearing.  HENT:     Head: Normocephalic and atraumatic.     Right Ear: Tympanic membrane, ear canal and external ear normal. There is no impacted cerumen.     Left Ear: Tympanic membrane, ear canal and external ear normal. There is no impacted cerumen.  Eyes:     General:        Right eye: No discharge.        Left eye: No discharge.     Extraocular Movements: Extraocular movements intact.     Conjunctiva/sclera: Conjunctivae normal.     Pupils: Pupils are equal, round, and reactive to light.  Neck:     Thyroid: No thyroid mass or thyromegaly.     Vascular: Carotid bruit (left) present.  Cardiovascular:     Rate and Rhythm: Normal rate and regular rhythm.     Pulses: Normal pulses.     Heart sounds: Normal heart sounds. No murmur heard. Pulmonary:     Effort: Pulmonary effort is normal. No respiratory distress.     Breath sounds: Normal breath sounds. No wheezing, rhonchi or rales.  Abdominal:     General: Bowel sounds are normal. There is no distension.     Palpations: Abdomen is soft. There is no mass.     Tenderness: There is no abdominal tenderness. There is no guarding or rebound.     Hernia: No hernia is present.  Musculoskeletal:     Cervical back: Normal range of motion and neck supple.  No rigidity.     Right lower leg: No edema.     Left lower leg: No edema.  Lymphadenopathy:     Cervical: No cervical adenopathy.  Skin:    General: Skin is warm and dry.     Findings: No rash.  Neurological:     General: No focal deficit present.     Mental Status: She is alert. Mental status is at baseline.  Psychiatric:        Mood and Affect: Mood normal.        Behavior: Behavior normal.      Results for orders placed or performed in visit on 05/19/21  IBC panel  Result Value Ref Range   Iron 54 42 - 145 ug/dL   Transferrin 267.0 212.0 - 360.0 mg/dL   Saturation Ratios 14.4 (L) 20.0 - 50.0 %  Ferritin  Result Value Ref Range   Ferritin 14.0 10.0 - 291.0 ng/mL  Hemoglobin A1c  Result Value Ref Range   Hgb A1c MFr Bld 6.5 4.6 - 6.5 %  VITAMIN D 25 Hydroxy (Vit-D Deficiency, Fractures)  Result Value Ref Range   VITD 40.93 30.00 - 100.00 ng/mL  Comprehensive metabolic panel  Result Value Ref Range   Sodium 139 135 - 145 mEq/L   Potassium 4.2 3.5 - 5.1 mEq/L   Chloride 104 96 - 112 mEq/L   CO2 28 19 - 32 mEq/L   Glucose, Bld 107 (H) 70 - 99 mg/dL   BUN 16 6 - 23 mg/dL   Creatinine, Ser 0.85 0.40 - 1.20 mg/dL   Total Bilirubin 0.4 0.2 - 1.2 mg/dL   Alkaline Phosphatase 76 39 - 117 U/L   AST 13 0 - 37 U/L   ALT 11 0 - 35 U/L   Total Protein 6.8 6.0 - 8.3 g/dL   Albumin 4.0 3.5 - 5.2 g/dL   GFR 66.55 >60.00 mL/min   Calcium 9.4 8.4 - 10.5 mg/dL  Lipid panel  Result Value Ref Range   Cholesterol 151 0 - 200 mg/dL   Triglycerides 183.0 (H) 0.0 - 149.0 mg/dL   HDL 53.00 >39.00 mg/dL   VLDL 36.6 0.0 - 40.0 mg/dL   LDL Cholesterol 61 0 - 99 mg/dL   Total CHOL/HDL Ratio 3    NonHDL 98.03     Assessment & Plan:  This visit occurred during the SARS-CoV-2 public health emergency.  Safety protocols were in place, including screening questions prior to the visit, additional usage of staff PPE, and extensive cleaning of exam room while observing appropriate contact time  as indicated for disinfecting solutions.   Problem List Items Addressed This Visit     HLD (hyperlipidemia)    Chronic, adequate on low dose atorvastatin with LDL goal <70 - continue. The 10-year ASCVD risk score Mikey Bussing DC Brooke Bonito., et al., 2013) is: 24.2%   Values used to calculate the score:     Age: 20 years     Sex: Female     Is Non-Hispanic African American: No     Diabetic: No     Tobacco smoker: No     Systolic Blood Pressure: Q000111Q mmHg     Is BP treated: Yes     HDL Cholesterol: 53 mg/dL     Total Cholesterol: 151 mg/dL        Relevant Medications   EPINEPHrine 0.3 mg/0.3 mL IJ SOAJ injection   Iron deficiency anemia    Anemia has resolved, iron stores remain low - will start oral iron QOD       Relevant Medications   Iron, Ferrous Sulfate, 325 (65 Fe) MG TABS   Essential hypertension    Chronic, stable on higher losartan dose - continue.        Relevant Medications   EPINEPHrine 0.3 mg/0.3 mL IJ SOAJ injection   GERD    Continue daily omeprazole.        Relevant Medications   ondansetron (ZOFRAN) 4 MG tablet   Recurrent UTI    Continue uro f/u.        Ischemic colitis [facility])   Advanced care planning/counseling discussion    Advanced directive discussion - Has not set up. Would want husband to be HCPOA. Packet previously provided.       Health maintenance examination - Primary    Preventative protocols reviewed and updated unless pt declined. Discussed healthy diet and lifestyle.        Mixed incontinence    Stable period.        Left carotid artery stenosis    Latest [place] reviewed.  R subclavian stenosis - normal BPs bilateral arms today.        Relevant Medications   EPINEPHrine 0.3 mg/0.3 mL IJ SOAJ injection   Prediabetes    Levels in diet controlled diabetes range - recheck in 6 months.        Vitamin D deficiency    Continue 1000 IU vit D daily.        Irritable bowel syndrome (IBS)   Relevant Medications   ondansetron (ZOFRAN)  4 MG tablet   Stenosis of right subclavian artery (HCC)    BUE BP stable today.        Relevant Medications   EPINEPHrine 0.3 mg/0.3 mL IJ SOAJ injection   Osteoporosis    Did not tolerate fosamax.  Continue calcium, vit D, regular walking.          Meds ordered this encounter  Medications   ondansetron (ZOFRAN)  4 MG tablet    Sig: Take 1 tablet (4 mg total) by mouth every 8 (eight) hours as needed for nausea or vomiting.    Dispense:  20 tablet    Refill:  0   EPINEPHrine 0.3 mg/0.3 mL IJ SOAJ injection    Sig: Inject 0.3 mg into the muscle as needed for anaphylaxis.    Dispense:  1 each    Refill:  1   Iron, Ferrous Sulfate, 325 (65 Fe) MG TABS    Sig: Take 325 mg by mouth every other day.   No orders of the defined types were placed in this encounter.    Patient instructions: Epi pen sent in given recent reaction. Let me know if interested in allergist evaluation.  Work on Ecologist. Consider oral iron supplement every other day to improve iron stores. Could try slow-release iron preparations.  You are doing well today Return as needed or in 6 months for follow up visit.   Follow up plan: Return in about 6 months (around 11/20/2021) for follow up visit.  Ria Bush, MD

## 2021-05-20 NOTE — Assessment & Plan Note (Signed)
BUE BP stable today.

## 2021-05-20 NOTE — Assessment & Plan Note (Signed)
Anemia has resolved, iron stores remain low - will start oral iron QOD

## 2021-05-20 NOTE — Assessment & Plan Note (Signed)
Chronic, adequate on low dose atorvastatin with LDL goal <70 - continue. The 10-year ASCVD risk score Mikey Bussing DC Brooke Bonito., et al., 2013) is: 24.2%   Values used to calculate the score:     Age: 75 years     Sex: Female     Is Non-Hispanic African American: No     Diabetic: No     Tobacco smoker: No     Systolic Blood Pressure: Q000111Q mmHg     Is BP treated: Yes     HDL Cholesterol: 53 mg/dL     Total Cholesterol: 151 mg/dL

## 2021-05-20 NOTE — Patient Instructions (Addendum)
Epi pen sent in given recent reaction. Let me know if interested in allergist evaluation.  Work on Ecologist. Consider oral iron supplement every other day to improve iron stores. Could try slow-release iron preparations.  You are doing well today Return as needed or in 6 months for follow up visit.   Health Maintenance After Age 76 After age 35, you are at a higher risk for certain long-term diseases and infections as well as injuries from falls. Falls are a major cause of broken bones and head injuries in people who are older than age 49. Getting regular preventive care can help to keep you healthy and well. Preventive care includes getting regular testing and making lifestyle changes as recommended by your health care provider. Talk with your health care provider about: Which screenings and tests you should have. A screening is a test that checks for a disease when you have no symptoms. A diet and exercise plan that is right for you. What should I know about screenings and tests to prevent falls? Screening and testing are the best ways to find a health problem early. Early diagnosis and treatment give you the best chance of managing medical conditions that are common after age 39. Certain conditions and lifestyle choices may make you more likely to have a fall. Your health care provider may recommend: Regular vision checks. Poor vision and conditions such as cataracts can make you more likely to have a fall. If you wear glasses, make sure to get your prescription updated if your vision changes. Medicine review. Work with your health care provider to regularly review all of the medicines you are taking, including over-the-counter medicines. Ask your health care provider about any side effects that may make you more likely to have a fall. Tell your health care provider if any medicines that you take make you feel dizzy or sleepy. Osteoporosis screening. Osteoporosis is a condition that  causes the bones to get weaker. This can make the bones weak and cause them to break more easily. Blood pressure screening. Blood pressure changes and medicines to control blood pressure can make you feel dizzy. Strength and balance checks. Your health care provider may recommend certain tests to check your strength and balance while standing, walking, or changing positions. Foot health exam. Foot pain and numbness, as well as not wearing proper footwear, can make you more likely to have a fall. Depression screening. You may be more likely to have a fall if you have a fear of falling, feel emotionally low, or feel unable to do activities that you used to do. Alcohol use screening. Using too much alcohol can affect your balance and may make you more likely to have a fall. What actions can I take to lower my risk of falls? General instructions Talk with your health care provider about your risks for falling. Tell your health care provider if: You fall. Be sure to tell your health care provider about all falls, even ones that seem minor. You feel dizzy, sleepy, or off-balance. Take over-the-counter and prescription medicines only as told by your health care provider. These include any supplements. Eat a healthy diet and maintain a healthy weight. A healthy diet includes low-fat dairy products, low-fat (lean) meats, and fiber from whole grains, beans, and lots of fruits and vegetables. Home safety Remove any tripping hazards, such as rugs, cords, and clutter. Install safety equipment such as grab bars in bathrooms and safety rails on stairs. Keep rooms and walkways well-lit.  Activity  Follow a regular exercise program to stay fit. This will help you maintain your balance. Ask your health care provider what types of exercise are appropriate for you. If you need a cane or walker, use it as recommended by your health care provider. Wear supportive shoes that have nonskid soles.  Lifestyle Do not  drink alcohol if your health care provider tells you not to drink. If you drink alcohol, limit how much you have: 0-1 drink a day for women. 0-2 drinks a day for men. Be aware of how much alcohol is in your drink. In the [place] one drink equals one typical bottle of beer (12 oz), one-half glass of wine (5 oz), or one shot of hard liquor (1 oz). Do not use any products that contain nicotine or tobacco, such as cigarettes and e-cigarettes. If you need help quitting, ask your health care provider. Summary Having a healthy lifestyle and getting preventive care can help to protect your health and wellness after age 51. Screening and testing are the best way to find a health problem early and help you avoid having a fall. Early diagnosis and treatment give you the best chance for managing medical conditions that are more common for people who are older than age 57. Falls are a major cause of broken bones and head injuries in people who are older than age 29. Take precautions to prevent a fall at home. Work with your health care provider to learn what changes you can make to improve your health and wellness and to prevent falls. This information is not intended to replace advice given to you by your health care provider. Make sure you discuss any questions you have with your healthcare provider. Document Revised: 09/20/2020 Document Reviewed: 09/20/2020 Elsevier Patient Education  2022 [employer].

## 2021-05-27 ENCOUNTER — Ambulatory Visit
Admission: RE | Admit: 2021-05-27 | Discharge: 2021-05-27 | Disposition: A | Discharge status: Home / Self Care | Payer: PPO | Source: Ambulatory Visit | Attending: Obstetrics and Gynecology | Admitting: Obstetrics and Gynecology

## 2021-05-27 ENCOUNTER — Other Ambulatory Visit: Payer: Self-pay

## 2021-05-27 DIAGNOSIS — N6320 Unspecified lump in the left breast, unspecified quadrant: Secondary | ICD-10-CM

## 2021-05-27 DIAGNOSIS — R922 Inconclusive mammogram: Secondary | ICD-10-CM | POA: Diagnosis not present

## 2021-06-09 DIAGNOSIS — H02051 Trichiasis without entropian right upper eyelid: Secondary | ICD-10-CM | POA: Diagnosis not present

## 2021-06-11 ENCOUNTER — Other Ambulatory Visit: Payer: Self-pay | Admitting: Family Medicine

## 2021-06-12 NOTE — Telephone Encounter (Signed)
Last OV - 05/20/2021  Next OV - 11/21/2021  Last Filled - 04/18/2021

## 2021-06-12 NOTE — Telephone Encounter (Signed)
ERx 

## 2021-06-16 DIAGNOSIS — R3915 Urgency of urination: Secondary | ICD-10-CM | POA: Diagnosis not present

## 2021-06-16 DIAGNOSIS — N952 Postmenopausal atrophic vaginitis: Secondary | ICD-10-CM | POA: Diagnosis not present

## 2021-06-16 DIAGNOSIS — Z87440 Personal history of urinary (tract) infections: Secondary | ICD-10-CM | POA: Diagnosis not present

## 2021-06-16 DIAGNOSIS — R102 Pelvic and perineal pain: Secondary | ICD-10-CM | POA: Diagnosis not present

## 2021-06-25 ENCOUNTER — Other Ambulatory Visit: Payer: Self-pay | Admitting: Obstetrics and Gynecology

## 2021-06-25 DIAGNOSIS — Z1231 Encounter for screening mammogram for malignant neoplasm of breast: Secondary | ICD-10-CM

## 2021-07-01 DIAGNOSIS — H02051 Trichiasis without entropian right upper eyelid: Secondary | ICD-10-CM | POA: Diagnosis not present

## 2021-07-07 ENCOUNTER — Other Ambulatory Visit: Payer: Self-pay | Admitting: Family Medicine

## 2021-07-07 NOTE — Telephone Encounter (Signed)
Spoke to patient by telephone and was advised that she only needs a refill on her Zofran.  Last refill 05/20/21 #20  Patient stated since she has been taking iron she is constipated for a couple of days and then has diarrhea for a couple of days. Patient stated that when she has diarrhea she feels nauseated and the Zofran helps. Patient stated that she has plenty of the other medications at the current time. Patient stated that they must have her on automatic refills. Patient stated that she will let the pharmacy know when she actually needs refills Last office visit 05/20/21 Upcoming appointment 11/21/21

## 2021-07-08 NOTE — Telephone Encounter (Signed)
ERx. Other meds too early

## 2021-07-23 DIAGNOSIS — L821 Other seborrheic keratosis: Secondary | ICD-10-CM | POA: Diagnosis not present

## 2021-07-23 DIAGNOSIS — L570 Actinic keratosis: Secondary | ICD-10-CM | POA: Diagnosis not present

## 2021-07-23 DIAGNOSIS — D225 Melanocytic nevi of trunk: Secondary | ICD-10-CM | POA: Diagnosis not present

## 2021-07-23 DIAGNOSIS — Z85828 Personal history of other malignant neoplasm of skin: Secondary | ICD-10-CM | POA: Diagnosis not present

## 2021-07-23 DIAGNOSIS — L814 Other melanin hyperpigmentation: Secondary | ICD-10-CM | POA: Diagnosis not present

## 2021-07-24 ENCOUNTER — Telehealth: Payer: Self-pay

## 2021-07-24 NOTE — Progress Notes (Signed)
    Chronic Care Management Pharmacy Assistant   Name: Bianca Fry  MRN: [id number] DOB: [birth year]  Medications: Outpatient Encounter Medications as of 07/24/2021  Medication Sig   ALPRAZolam (XANAX) 0.5 MG tablet TAKE ONE TABLET BY MOUTH TWICE A DAY AS NEEDED FOR ANXIETY   atenolol (TENORMIN) 25 MG tablet Take 1 tablet (25 mg total) by mouth 2 (two) times daily.   atorvastatin (LIPITOR) 10 MG tablet TAKE ONE TABLET BY MOUTH ONE TIME DAILY   Calcium Carb-Cholecalciferol (CALCIUM-VITAMIN D) 600-400 MG-UNIT TABS Take 1 tablet by mouth daily.   cephALEXin (KEFLEX) 250 MG capsule Take by mouth daily. Takes to prevent UTIs   cholecalciferol (VITAMIN D) 25 MCG (1000 UNIT) tablet Take 1,000 Units by mouth daily.   conjugated estrogens (PREMARIN) vaginal cream Place 1 Applicatorful vaginally daily. Use 1/2 g vaginally two or three times per week as needed to maintain symptom relief.   dicyclomine (BENTYL) 10 MG capsule Take 10 mg by mouth 2 (two) times daily as needed for spasms.   EPINEPHrine 0.3 mg/0.3 mL IJ SOAJ injection Inject 0.3 mg into the muscle as needed for anaphylaxis.   fexofenadine (ALLEGRA) 180 MG tablet Take 180 mg by mouth daily as needed for allergies.   Iron, Ferrous Sulfate, 325 (65 Fe) MG TABS Take 325 mg by mouth every other day.   lactobacillus acidophilus (BACID) TABS tablet Take 2 tablets by mouth daily.   losartan (COZAAR) 50 MG tablet Take 1.5 tablets (75 mg total) by mouth daily.   meloxicam (MOBIC) 7.5 MG tablet Take 7.5 mg by mouth daily.   omeprazole (PRILOSEC) 40 MG capsule TAKE ONE CAPSULE BY MOUTH ONE TIME DAILY   ondansetron (ZOFRAN) 4 MG tablet TAKE ONE TABLET BY MOUTH EVERY 8 HOURS AS NEEDED FOR NAUSEA AND VOMITING   valACYclovir (VALTREX) 1000 MG tablet Take 2,000 mg by mouth in the morning and at bedtime. (for one day if needed for flares)   No facility-administered encounter medications on file as of 07/24/2021.    Attempted to contact patient to  reschedule missed initial appointment in July. Unable to reach patient.    Debbora Dus, CPP notified  Margaretmary Dys, [place] Pharmacy Assistant [phone]

## 2021-07-25 ENCOUNTER — Ambulatory Visit (INDEPENDENT_AMBULATORY_CARE_PROVIDER_SITE_OTHER): Payer: PPO

## 2021-07-25 ENCOUNTER — Other Ambulatory Visit: Payer: Self-pay

## 2021-07-25 DIAGNOSIS — Z23 Encounter for immunization: Secondary | ICD-10-CM | POA: Diagnosis not present

## 2021-07-25 NOTE — Telephone Encounter (Signed)
No Problem, Thank you Tati

## 2021-07-25 NOTE — Telephone Encounter (Signed)
Hey there can you forward this to the PTM they do the rescheduling. I was unable to forward this myself.  Thanks  Tati

## 2021-08-06 ENCOUNTER — Other Ambulatory Visit: Payer: Self-pay | Admitting: Family Medicine

## 2021-08-19 ENCOUNTER — Other Ambulatory Visit: Payer: Self-pay | Admitting: Family Medicine

## 2021-08-19 NOTE — Telephone Encounter (Signed)
Name of Medication: Alprazolam Name of Pharmacy: [facility] or Written Date and Quantity: 06/12/21, #40 Last Office Visit and Type: 05/20/21, AWV Next Office Visit and Type: 11/21/21, 6 mo f/u Last Controlled Substance Agreement Date: 04/16/17 Last UDS: 04/16/17

## 2021-08-20 NOTE — Telephone Encounter (Signed)
ERx 

## 2021-09-16 DIAGNOSIS — H02051 Trichiasis without entropian right upper eyelid: Secondary | ICD-10-CM | POA: Diagnosis not present

## 2021-09-16 DIAGNOSIS — Z961 Presence of intraocular lens: Secondary | ICD-10-CM | POA: Diagnosis not present

## 2021-09-16 DIAGNOSIS — H524 Presbyopia: Secondary | ICD-10-CM | POA: Diagnosis not present

## 2021-09-16 DIAGNOSIS — H02401 Unspecified ptosis of right eyelid: Secondary | ICD-10-CM | POA: Diagnosis not present

## 2021-09-16 DIAGNOSIS — H2512 Age-related nuclear cataract, left eye: Secondary | ICD-10-CM | POA: Diagnosis not present

## 2021-09-29 ENCOUNTER — Ambulatory Visit
Admission: RE | Admit: 2021-09-29 | Discharge: 2021-09-29 | Disposition: A | Discharge status: Home / Self Care | Payer: PPO | Source: Ambulatory Visit | Attending: Obstetrics and Gynecology | Admitting: Obstetrics and Gynecology

## 2021-09-29 DIAGNOSIS — Z1231 Encounter for screening mammogram for malignant neoplasm of breast: Secondary | ICD-10-CM | POA: Diagnosis not present

## 2021-09-30 ENCOUNTER — Other Ambulatory Visit: Payer: Self-pay | Admitting: Family Medicine

## 2021-10-07 ENCOUNTER — Other Ambulatory Visit: Payer: Self-pay | Admitting: Family Medicine

## 2021-10-07 NOTE — Telephone Encounter (Signed)
Name of Medication: Alprazolam Name of Pharmacy: [facility] or Written Date and Quantity: 08/20/21, #40 Last Office Visit and Type: 05/20/21, AWV prt 2 Next Office Visit and Type: 11/21/21, 6 mo f/u Last Controlled Substance Agreement Date: 04/15/18 Last UDS: 04/15/18

## 2021-10-08 NOTE — Telephone Encounter (Signed)
ERx 

## 2021-10-15 ENCOUNTER — Ambulatory Visit (INDEPENDENT_AMBULATORY_CARE_PROVIDER_SITE_OTHER): Payer: PPO | Admitting: Family Medicine

## 2021-10-15 ENCOUNTER — Other Ambulatory Visit: Payer: Self-pay

## 2021-10-15 ENCOUNTER — Encounter: Payer: Self-pay | Admitting: Family Medicine

## 2021-10-15 VITALS — BP 156/80 | HR 83 | Temp 97.7°F | Ht 63.5 in | Wt 146.5 lb

## 2021-10-15 DIAGNOSIS — J069 Acute upper respiratory infection, unspecified: Secondary | ICD-10-CM

## 2021-10-15 MED ORDER — BENZONATATE 200 MG PO CAPS: 200.0000 mg | ORAL_CAPSULE | Freq: Three times a day (TID) | ORAL | 1 refills | Status: DC | PRN
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
## 2021-10-15 NOTE — Assessment & Plan Note (Signed)
Suspect some post viral cough now Reassuring exam  Wanted to try prednisone, she declines Sent in [place]  Will continue robitussin DM (or nyquil at night) Discussed sympt care ER precautions reviewed  Update if not starting to improve in a week or if worsening

## 2021-10-15 NOTE — Patient Instructions (Signed)
Drink fluids and rest  Robitussin DM is good for cough and congestion   Try the tessalon for additional cough control   Try your albuterol inhaler also   Update if not starting to improve in a week or if worsening

## 2021-10-22 ENCOUNTER — Telehealth: Payer: Self-pay | Admitting: Cardiovascular Disease

## 2021-10-22 ENCOUNTER — Other Ambulatory Visit: Payer: Self-pay

## 2021-10-22 ENCOUNTER — Ambulatory Visit: Payer: PPO | Admitting: Cardiovascular Disease

## 2021-10-22 ENCOUNTER — Encounter: Payer: Self-pay | Admitting: Cardiovascular Disease

## 2021-10-22 VITALS — BP 160/70 | HR 72 | Ht 64.0 in | Wt 148.1 lb

## 2021-10-22 DIAGNOSIS — I739 Peripheral vascular disease, unspecified: Secondary | ICD-10-CM | POA: Diagnosis not present

## 2021-10-22 DIAGNOSIS — I2510 Atherosclerotic heart disease of native coronary artery without angina pectoris: Secondary | ICD-10-CM | POA: Diagnosis not present

## 2021-10-22 DIAGNOSIS — I771 Stricture of artery: Secondary | ICD-10-CM

## 2021-10-22 NOTE — Patient Instructions (Addendum)
Medication Instructions:  No changes  Keep an eye on blood pressure (check 2-3 houirs after taking medication)  If you need a refill on your cardiac medications before your next appointment, please call your pharmacy.   Lab work: No new labs needed  Testing/Procedures: Carotid ultrasound   Follow-Up: At [facility], you and your health needs are our priority.  As part of our continuing mission to provide you with exceptional heart care, we have created designated Provider Care Teams.  These Care Teams include your primary Cardiologist (physician) and Advanced Practice Providers (APPs -  Physician Assistants and Nurse Practitioners) who all work together to provide you with the care you need, when you need it.  You will need a follow up appointment in 12 months  Providers on your designated Care Team:   Murray Hodgkins, NP Christell Faith, PA-C Cadence Kathlen Mody, [place]  COVID-19 Vaccine Information can be found at: [email or web address].uk For questions related to vaccine distribution or appointments, please email [email or web address] or call [phone].
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
## 2021-10-22 NOTE — Telephone Encounter (Signed)
Patient wants to repeat carotid US yearly . Last test 12/16/20.   Patient aware if ordered we will call to schedule.

## 2021-10-22 NOTE — Telephone Encounter (Signed)
Dr. Rockey Situ verbally gave consent to order carotid u/s

## 2021-11-10 ENCOUNTER — Ambulatory Visit
Admission: RE | Admit: 2021-11-10 | Discharge: 2021-11-10 | Disposition: A | Discharge status: Home / Self Care | Payer: PPO | Source: Ambulatory Visit | Attending: Obstetrics and Gynecology | Admitting: Obstetrics and Gynecology

## 2021-11-10 DIAGNOSIS — M8580 Other specified disorders of bone density and structure, unspecified site: Secondary | ICD-10-CM

## 2021-11-10 DIAGNOSIS — Z780 Asymptomatic menopausal state: Secondary | ICD-10-CM | POA: Diagnosis not present

## 2021-11-10 DIAGNOSIS — M8589 Other specified disorders of bone density and structure, multiple sites: Secondary | ICD-10-CM | POA: Diagnosis not present

## 2021-11-21 ENCOUNTER — Ambulatory Visit (INDEPENDENT_AMBULATORY_CARE_PROVIDER_SITE_OTHER): Payer: PPO | Admitting: Family Medicine

## 2021-11-21 ENCOUNTER — Encounter: Payer: Self-pay | Admitting: Family Medicine

## 2021-11-21 ENCOUNTER — Other Ambulatory Visit: Payer: Self-pay

## 2021-11-21 VITALS — BP 136/80 | HR 64 | Temp 97.7°F | Ht 64.0 in | Wt 147.5 lb

## 2021-11-21 DIAGNOSIS — M85832 Other specified disorders of bone density and structure, left forearm: Secondary | ICD-10-CM | POA: Diagnosis not present

## 2021-11-21 DIAGNOSIS — N390 Urinary tract infection, site not specified: Secondary | ICD-10-CM | POA: Diagnosis not present

## 2021-11-21 DIAGNOSIS — I10 Essential (primary) hypertension: Secondary | ICD-10-CM | POA: Diagnosis not present

## 2021-11-21 DIAGNOSIS — K582 Mixed irritable bowel syndrome: Secondary | ICD-10-CM

## 2021-11-21 DIAGNOSIS — R7303 Prediabetes: Secondary | ICD-10-CM

## 2021-11-21 DIAGNOSIS — D509 Iron deficiency anemia, unspecified: Secondary | ICD-10-CM | POA: Diagnosis not present

## 2021-11-21 LAB — CBC WITH DIFFERENTIAL/PLATELET
Basophils Absolute: 0 10*3/uL (ref 0.0–0.1)
Basophils Relative: 0.4 % (ref 0.0–3.0)
Eosinophils Absolute: 0.2 10*3/uL (ref 0.0–0.7)
Eosinophils Relative: 2.4 % (ref 0.0–5.0)
HCT: 37.4 % (ref 36.0–46.0)
Hemoglobin: 12.1 g/dL (ref 12.0–15.0)
Lymphocytes Relative: 30.4 % (ref 12.0–46.0)
Lymphs Abs: 2.3 10*3/uL (ref 0.7–4.0)
MCHC: 32.5 g/dL (ref 30.0–36.0)
MCV: 85.8 fl (ref 78.0–100.0)
Monocytes Absolute: 0.6 10*3/uL (ref 0.1–1.0)
Monocytes Relative: 8.2 % (ref 3.0–12.0)
Neutro Abs: 4.5 10*3/uL (ref 1.4–7.7)
Neutrophils Relative %: 58.6 % (ref 43.0–77.0)
Platelets: 141 10*3/uL — ABNORMAL LOW (ref 150.0–400.0)
RBC: 4.36 Mil/uL (ref 3.87–5.11)
RDW: 14 % (ref 11.5–15.5)
WBC: 7.6 10*3/uL (ref 4.0–10.5)

## 2021-11-21 LAB — IBC PANEL
Iron: 77 ug/dL (ref 42–145)
Saturation Ratios: 23.1 % (ref 20.0–50.0)
TIBC: 333.2 ug/dL (ref 250.0–450.0)
Transferrin: 238 mg/dL (ref 212.0–360.0)

## 2021-11-21 LAB — FERRITIN: Ferritin: 28.1 ng/mL (ref 10.0–291.0)

## 2021-11-21 LAB — HEMOGLOBIN A1C: Hgb A1c MFr Bld: 6.4 % (ref 4.6–6.5)

## 2021-11-21 MED ORDER — DICYCLOMINE HCL 10 MG PO CAPS: 10.0000 mg | ORAL_CAPSULE | Freq: Two times a day (BID) | ORAL | 1 refills | Status: DC | PRN

## 2021-11-21 NOTE — Assessment & Plan Note (Signed)
Alternating diarrhea/constipation. Refill bentyl.

## 2021-11-21 NOTE — Assessment & Plan Note (Addendum)
Sees urology Bianca Fry) yearly on keflex ppx

## 2021-11-21 NOTE — Patient Instructions (Addendum)
Labs today  Bentyl refilled today  Good to see you today Return as needed or in 6 months for follow up visit.

## 2021-11-21 NOTE — Assessment & Plan Note (Signed)
Reviewed recent DEXA through GYN. rec continue cal, vit D replacement, and regular weight bearing exercises.

## 2021-11-21 NOTE — Assessment & Plan Note (Addendum)
Update iron levels after regular QOD replacement. She did stop oral iron 1 wk ago due to GI upset.  She is planning to start slo-release iron.

## 2021-11-21 NOTE — Assessment & Plan Note (Signed)
Chronic, stable on losartan and atenolol - continue.

## 2021-11-21 NOTE — Progress Notes (Signed)
Patient ID: Bianca Fry, female    DOB: 22-Apr-1946, 76 y.o.   MRN: [id number]  This visit was conducted in person.  BP 136/80 (BP Location: Right Arm, Cuff Size: Normal)    Pulse 64    Temp 97.7 F (36.5 C) (Temporal)    Ht 5\' 4"  (1.626 m)    Wt 147 lb 8 oz (66.9 kg)    LMP 10/20/1979 (Within Years)    SpO2 97%    BMI 25.32 kg/m    CC: 6 mo f/u visit  Subjective:   HPI: Bianca Fry is a 76 y.o. female presenting on 11/21/2021 for Follow-up (Here for 6 mo f/u.)   Lost 2 brothers late last year. One died of esophageal cancer.   Recently saw Dr Rockey Situ for known PAD, carotid stenosis, aortic ATH, HTN.   Sees urology Dr Amalia Hailey for rUTIs as well as atrophic vaginitis. Managed with daily keflex ppx. Did not do well with previous trials of estrace or premarin. Last seen 05/2021.   On meloxicam 7.5mg  daily through [facility] for R hip OA. Takes this intermittently.   Known h/o ischemic colitis as well as IBS - this has improved since she stopped oral iron QOD last week. Seemed to do better with slo-release iron. Requests bentyl refill. Alternating diarrhea/constipation.   Notes left ankle swelling as well as toe pain (L 4th). Notes ankle swelling at night time. Wonders if related to Lipitor.   Osteopenia - did not tolerate oral bisphosphonates.  DEXA 10/2021 - T -1.9 L femur, -2.1 L forearm      Relevant past medical, surgical, family and social history reviewed and updated as indicated. Interim medical history since our last visit reviewed. Allergies and medications reviewed and updated. Outpatient Medications Prior to Visit  Medication Sig Dispense Refill   ALPRAZolam (XANAX) 0.5 MG tablet TAKE ONE TABLET BY MOUTH TWICE A DAY AS NEEDED FOR ANXIETY 40 tablet 0   atenolol (TENORMIN) 25 MG tablet TAKE ONE TABLET BY MOUTH TWICE A DAY 180 tablet 1   atorvastatin (LIPITOR) 10 MG tablet TAKE ONE TABLET BY MOUTH ONE TIME DAILY 90 tablet 2   Calcium Carb-Cholecalciferol (CALCIUM-VITAMIN  D) 600-400 MG-UNIT TABS Take 1 tablet by mouth daily.     cephALEXin (KEFLEX) 250 MG capsule Take by mouth daily. Takes to prevent UTIs     cholecalciferol (VITAMIN D) 25 MCG (1000 UNIT) tablet Take 1,000 Units by mouth daily.     conjugated estrogens (PREMARIN) vaginal cream Place 1 Applicatorful vaginally daily. Use 1/2 g vaginally two or three times per week as needed to maintain symptom relief. 30 g 2   EPINEPHrine 0.3 mg/0.3 mL IJ SOAJ injection Inject 0.3 mg into the muscle as needed for anaphylaxis. 1 each 1   fexofenadine (ALLEGRA) 180 MG tablet Take 180 mg by mouth daily as needed for allergies.     Iron, Ferrous Sulfate, 325 (65 Fe) MG TABS Take 325 mg by mouth every other day.     lactobacillus acidophilus (BACID) TABS tablet Take 2 tablets by mouth daily.     losartan (COZAAR) 50 MG tablet Take 1.5 tablets (75 mg total) by mouth daily. 135 tablet 3   meloxicam (MOBIC) 7.5 MG tablet Take 7.5 mg by mouth daily.     omeprazole (PRILOSEC) 40 MG capsule TAKE ONE CAPSULE BY MOUTH ONE TIME DAILY 90 capsule 3   ondansetron (ZOFRAN) 4 MG tablet TAKE ONE TABLET BY MOUTH EVERY 8 HOURS AS NEEDED FOR  NAUSEA AND VOMITING 20 tablet 0   valACYclovir (VALTREX) 1000 MG tablet Take 2,000 mg by mouth in the morning and at bedtime. (for one day if needed for flares)     dicyclomine (BENTYL) 10 MG capsule Take 10 mg by mouth 2 (two) times daily as needed for spasms. 90 capsule    benzonatate (TESSALON) 200 MG capsule Take 1 capsule (200 mg total) by mouth 3 (three) times daily as needed for cough. Swallow whole, do not bite pill 30 capsule 1   No facility-administered medications prior to visit.     Per HPI unless specifically indicated in ROS section below Review of Systems  Objective:  BP 136/80 (BP Location: Right Arm, Cuff Size: Normal)    Pulse 64    Temp 97.7 F (36.5 C) (Temporal)    Ht 5\' 4"  (1.626 m)    Wt 147 lb 8 oz (66.9 kg)    LMP 10/20/1979 (Within Years)    SpO2 97%    BMI 25.32 kg/m    Wt Readings from Last 3 Encounters:  11/21/21 147 lb 8 oz (66.9 kg)  10/22/21 148 lb 2 oz (67.2 kg)  10/15/21 146 lb 8 oz (66.5 kg)      Physical Exam Vitals and nursing note reviewed.  Constitutional:      Appearance: Normal appearance. She is not ill-appearing.  Eyes:     Extraocular Movements: Extraocular movements intact.     Pupils: Pupils are equal, round, and reactive to light.  Cardiovascular:     Rate and Rhythm: Normal rate and regular rhythm.     Pulses: Normal pulses.     Heart sounds: Normal heart sounds. No murmur heard. Pulmonary:     Effort: Pulmonary effort is normal. No respiratory distress.     Breath sounds: Normal breath sounds. No wheezing, rhonchi or rales.  Musculoskeletal:     Right lower leg: No edema.     Left lower leg: No edema.     Comments:  2+ DP bilaterally Tender to palpation at L 4th MT, no pain with axial loading   Skin:    General: Skin is warm and dry.     Findings: No rash.  Neurological:     Mental Status: She is alert.  Psychiatric:        Mood and Affect: Mood normal.        Behavior: Behavior normal.      Results for orders placed or performed in visit on 05/19/21  IBC panel  Result Value Ref Range   Iron 54 42 - 145 ug/dL   Transferrin 267.0 212.0 - 360.0 mg/dL   Saturation Ratios 14.4 (L) 20.0 - 50.0 %  Ferritin  Result Value Ref Range   Ferritin 14.0 10.0 - 291.0 ng/mL  Hemoglobin A1c  Result Value Ref Range   Hgb A1c MFr Bld 6.5 4.6 - 6.5 %  VITAMIN D 25 Hydroxy (Vit-D Deficiency, Fractures)  Result Value Ref Range   VITD 40.93 30.00 - 100.00 ng/mL  Comprehensive metabolic panel  Result Value Ref Range   Sodium 139 135 - 145 mEq/L   Potassium 4.2 3.5 - 5.1 mEq/L   Chloride 104 96 - 112 mEq/L   CO2 28 19 - 32 mEq/L   Glucose, Bld 107 (H) 70 - 99 mg/dL   BUN 16 6 - 23 mg/dL   Creatinine, Ser 0.85 0.40 - 1.20 mg/dL   Total Bilirubin 0.4 0.2 - 1.2 mg/dL   Alkaline Phosphatase 76 39 -  117 U/L   AST 13 0 - 37 U/L    ALT 11 0 - 35 U/L   Total Protein 6.8 6.0 - 8.3 g/dL   Albumin 4.0 3.5 - 5.2 g/dL   GFR 66.55 >60.00 mL/min   Calcium 9.4 8.4 - 10.5 mg/dL  Lipid panel  Result Value Ref Range   Cholesterol 151 0 - 200 mg/dL   Triglycerides 183.0 (H) 0.0 - 149.0 mg/dL   HDL 53.00 >39.00 mg/dL   VLDL 36.6 0.0 - 40.0 mg/dL   LDL Cholesterol 61 0 - 99 mg/dL   Total CHOL/HDL Ratio 3    NonHDL 98.03     Assessment & Plan:  This visit occurred during the SARS-CoV-2 public health emergency.  Safety protocols were in place, including screening questions prior to the visit, additional usage of staff PPE, and extensive cleaning of exam room while observing appropriate contact time as indicated for disinfecting solutions.   Problem List Items Addressed This Visit     Iron deficiency anemia - Primary    Update iron levels after regular QOD replacement. She did stop oral iron 1 wk ago due to GI upset.  She is planning to start slo-release iron.       Relevant Orders   Ferritin   IBC panel   CBC with Differential/Platelet   Essential hypertension    Chronic, stable on losartan and atenolol - continue.       Recurrent UTI    Sees urology Amalia Hailey) yearly on keflex ppx       Prediabetes    Update A1c.       Relevant Orders   Hemoglobin A1c   Irritable bowel syndrome (IBS)    Alternating diarrhea/constipation. Refill bentyl.       Relevant Medications   dicyclomine (BENTYL) 10 MG capsule   Osteopenia    Reviewed recent DEXA through GYN. rec continue cal, vit D replacement, and regular weight bearing exercises.         Meds ordered this encounter  Medications   dicyclomine (BENTYL) 10 MG capsule    Sig: Take 1 capsule (10 mg total) by mouth 2 (two) times daily as needed for spasms.    Dispense:  90 capsule    Refill:  1   Orders Placed This Encounter  Procedures   Ferritin   IBC panel   CBC with Differential/Platelet   Hemoglobin A1c    Patient Instructions  Labs today  Bentyl  refilled today  Good to see you today Return as needed or in 6 months for follow up visit.    Follow up plan: Return in about 6 months (around 05/21/2022) for annual exam, prior fasting for blood work, medicare wellness visit.  Ria Bush, MD

## 2021-11-21 NOTE — Assessment & Plan Note (Signed)
Update A1c ?

## 2021-11-22 ENCOUNTER — Other Ambulatory Visit: Payer: Self-pay | Admitting: Family Medicine

## 2021-11-22 MED ORDER — SLOW RELEASE IRON 160 (50 FE) MG PO TBCR: 1.0000 | EXTENDED_RELEASE_TABLET | ORAL | Status: DC

## 2021-12-01 DIAGNOSIS — H02051 Trichiasis without entropian right upper eyelid: Secondary | ICD-10-CM | POA: Diagnosis not present

## 2021-12-08 ENCOUNTER — Other Ambulatory Visit: Payer: Self-pay | Admitting: Family Medicine

## 2021-12-08 NOTE — Telephone Encounter (Signed)
Name of Medication: Alprazolam Name of Pharmacy: [place] or Written Date and Quantity: 10/08/21, #40 Last Office Visit and Type: 11/21/21, 6 mo f/u Next Office Visit and Type: 05/22/22, AWV prt 2 Last Controlled Substance Agreement Date: 04/16/17 Last UDS: 04/16/17

## 2021-12-09 ENCOUNTER — Ambulatory Visit (INDEPENDENT_AMBULATORY_CARE_PROVIDER_SITE_OTHER): Payer: PPO

## 2021-12-09 ENCOUNTER — Other Ambulatory Visit: Payer: Self-pay

## 2021-12-09 DIAGNOSIS — I2510 Atherosclerotic heart disease of native coronary artery without angina pectoris: Secondary | ICD-10-CM

## 2021-12-09 DIAGNOSIS — I6522 Occlusion and stenosis of left carotid artery: Secondary | ICD-10-CM | POA: Diagnosis not present

## 2021-12-09 DIAGNOSIS — I771 Stricture of artery: Secondary | ICD-10-CM

## 2021-12-10 NOTE — Telephone Encounter (Signed)
ERx 

## 2021-12-15 ENCOUNTER — Other Ambulatory Visit: Payer: Self-pay | Admitting: Family Medicine

## 2021-12-15 ENCOUNTER — Telehealth: Payer: Self-pay | Admitting: Cardiovascular Disease

## 2021-12-15 NOTE — Telephone Encounter (Signed)
Left voicemail message to call back  

## 2021-12-15 NOTE — Telephone Encounter (Signed)
Refill request Zofran Last office visit 11/21/21 Last refill 07/08/21 #20

## 2021-12-15 NOTE — Telephone Encounter (Signed)
Patient returning call  Assumes it was for results  Please call to discuss

## 2021-12-16 NOTE — Telephone Encounter (Signed)
12/15/2021  4:00 PM EST     See telephone encounter. Results reviewed with patient and she verbalized understanding with no further questions at this time.

## 2022-02-02 ENCOUNTER — Other Ambulatory Visit: Payer: Self-pay | Admitting: Family Medicine

## 2022-02-02 ENCOUNTER — Other Ambulatory Visit: Payer: Self-pay | Admitting: Cardiovascular Disease

## 2022-02-02 NOTE — Telephone Encounter (Signed)
Refill request Alprazolam  ?Last refill 12/10/21 #40 ?Last office visit 12/10/21 ?Upcoming appointment 05/22/22 ?

## 2022-02-03 NOTE — Telephone Encounter (Signed)
ERx 

## 2022-02-12 NOTE — Progress Notes (Signed)
Error

## 2022-02-20 DIAGNOSIS — R070 Pain in throat: Secondary | ICD-10-CM | POA: Diagnosis not present

## 2022-02-20 DIAGNOSIS — M26622 Arthralgia of left temporomandibular joint: Secondary | ICD-10-CM | POA: Diagnosis not present

## 2022-03-06 DIAGNOSIS — L570 Actinic keratosis: Secondary | ICD-10-CM | POA: Diagnosis not present

## 2022-03-06 DIAGNOSIS — L821 Other seborrheic keratosis: Secondary | ICD-10-CM | POA: Diagnosis not present

## 2022-03-06 DIAGNOSIS — L814 Other melanin hyperpigmentation: Secondary | ICD-10-CM | POA: Diagnosis not present

## 2022-03-06 DIAGNOSIS — L820 Inflamed seborrheic keratosis: Secondary | ICD-10-CM | POA: Diagnosis not present

## 2022-03-06 DIAGNOSIS — Z85828 Personal history of other malignant neoplasm of skin: Secondary | ICD-10-CM | POA: Diagnosis not present

## 2022-03-09 DIAGNOSIS — H02051 Trichiasis without entropian right upper eyelid: Secondary | ICD-10-CM | POA: Diagnosis not present

## 2022-03-23 ENCOUNTER — Other Ambulatory Visit: Payer: Self-pay | Admitting: Family Medicine

## 2022-03-23 NOTE — Telephone Encounter (Signed)
Last filled 02-03-22 #40 Last OV 11-21-21 Next OV 05-22-22 [facility]

## 2022-03-24 NOTE — Telephone Encounter (Signed)
ERx 

## 2022-04-03 ENCOUNTER — Other Ambulatory Visit: Payer: Self-pay | Admitting: Family Medicine

## 2022-04-15 DIAGNOSIS — H35371 Puckering of macula, right eye: Secondary | ICD-10-CM | POA: Diagnosis not present

## 2022-04-15 DIAGNOSIS — H2512 Age-related nuclear cataract, left eye: Secondary | ICD-10-CM | POA: Diagnosis not present

## 2022-04-15 DIAGNOSIS — H25012 Cortical age-related cataract, left eye: Secondary | ICD-10-CM | POA: Diagnosis not present

## 2022-04-15 DIAGNOSIS — H524 Presbyopia: Secondary | ICD-10-CM | POA: Diagnosis not present

## 2022-04-15 DIAGNOSIS — H25042 Posterior subcapsular polar age-related cataract, left eye: Secondary | ICD-10-CM | POA: Diagnosis not present

## 2022-04-27 ENCOUNTER — Ambulatory Visit: Payer: PPO | Admitting: Obstetrics and Gynecology

## 2022-05-14 ENCOUNTER — Other Ambulatory Visit: Payer: Self-pay | Admitting: Family Medicine

## 2022-05-14 DIAGNOSIS — R7303 Prediabetes: Secondary | ICD-10-CM

## 2022-05-14 DIAGNOSIS — E785 Hyperlipidemia, unspecified: Secondary | ICD-10-CM

## 2022-05-14 DIAGNOSIS — E559 Vitamin D deficiency, unspecified: Secondary | ICD-10-CM

## 2022-05-14 DIAGNOSIS — D509 Iron deficiency anemia, unspecified: Secondary | ICD-10-CM

## 2022-05-15 ENCOUNTER — Other Ambulatory Visit (INDEPENDENT_AMBULATORY_CARE_PROVIDER_SITE_OTHER): Payer: PPO

## 2022-05-15 DIAGNOSIS — R7303 Prediabetes: Secondary | ICD-10-CM | POA: Diagnosis not present

## 2022-05-15 DIAGNOSIS — D509 Iron deficiency anemia, unspecified: Secondary | ICD-10-CM

## 2022-05-15 DIAGNOSIS — E785 Hyperlipidemia, unspecified: Secondary | ICD-10-CM

## 2022-05-15 DIAGNOSIS — E559 Vitamin D deficiency, unspecified: Secondary | ICD-10-CM | POA: Diagnosis not present

## 2022-05-15 LAB — COMPREHENSIVE METABOLIC PANEL
ALT: 12 U/L (ref 0–35)
AST: 15 U/L (ref 0–37)
Albumin: 4.1 g/dL (ref 3.5–5.2)
Alkaline Phosphatase: 69 U/L (ref 39–117)
BUN: 20 mg/dL (ref 6–23)
CO2: 27 mEq/L (ref 19–32)
Calcium: 9.3 mg/dL (ref 8.4–10.5)
Chloride: 106 mEq/L (ref 96–112)
Creatinine, Ser: 0.86 mg/dL (ref 0.40–1.20)
GFR: 65.17 mL/min (ref >60.00)
Glucose, Bld: 103 mg/dL — ABNORMAL HIGH (ref 70–99)
Potassium: 4 mEq/L (ref 3.5–5.1)
Sodium: 140 mEq/L (ref 135–145)
Total Bilirubin: 0.5 mg/dL (ref 0.2–1.2)
Total Protein: 6.5 g/dL (ref 6.0–8.3)

## 2022-05-15 LAB — CBC WITH DIFFERENTIAL/PLATELET
Basophils Absolute: 0 10*3/uL (ref 0.0–0.1)
Basophils Relative: 0.4 % (ref 0.0–3.0)
Eosinophils Absolute: 0.2 10*3/uL (ref 0.0–0.7)
Eosinophils Relative: 2.4 % (ref 0.0–5.0)
HCT: 34.1 % — ABNORMAL LOW (ref 36.0–46.0)
Hemoglobin: 11.5 g/dL — ABNORMAL LOW (ref 12.0–15.0)
Lymphocytes Relative: 35.7 % (ref 12.0–46.0)
Lymphs Abs: 2.5 10*3/uL (ref 0.7–4.0)
MCHC: 33.6 g/dL (ref 30.0–36.0)
MCV: 86.1 fl (ref 78.0–100.0)
Monocytes Absolute: 0.7 10*3/uL (ref 0.1–1.0)
Monocytes Relative: 10 % (ref 3.0–12.0)
Neutro Abs: 3.7 10*3/uL (ref 1.4–7.7)
Neutrophils Relative %: 51.5 % (ref 43.0–77.0)
Platelets: 129 10*3/uL — ABNORMAL LOW (ref 150.0–400.0)
RBC: 3.95 Mil/uL (ref 3.87–5.11)
RDW: 13.7 % (ref 11.5–15.5)
WBC: 7.1 10*3/uL (ref 4.0–10.5)

## 2022-05-15 LAB — HEMOGLOBIN A1C: Hgb A1c MFr Bld: 6.3 % (ref 4.6–6.5)

## 2022-05-15 LAB — LIPID PANEL
Cholesterol: 134 mg/dL (ref 0–200)
HDL: 48.2 mg/dL (ref >39.00)
LDL Cholesterol: 58 mg/dL (ref 0–99)
NonHDL: 85.33
Total CHOL/HDL Ratio: 3
Triglycerides: 139 mg/dL (ref 0.0–149.0)
VLDL: 27.8 mg/dL (ref 0.0–40.0)

## 2022-05-15 LAB — VITAMIN D 25 HYDROXY (VIT D DEFICIENCY, FRACTURES): VITD: 32.9 ng/mL (ref 30.00–100.00)

## 2022-05-15 LAB — FERRITIN: Ferritin: 30.6 ng/mL (ref 10.0–291.0)

## 2022-05-19 HISTORY — PX: CATARACT EXTRACTION W/ INTRAOCULAR LENS IMPLANT: SHX1309

## 2022-05-22 ENCOUNTER — Ambulatory Visit (INDEPENDENT_AMBULATORY_CARE_PROVIDER_SITE_OTHER): Payer: PPO | Admitting: Family Medicine

## 2022-05-22 ENCOUNTER — Encounter: Payer: Self-pay | Admitting: Family Medicine

## 2022-05-22 VITALS — BP 134/72 | HR 60 | Temp 98.0°F | Ht 63.0 in | Wt 149.0 lb

## 2022-05-22 DIAGNOSIS — D509 Iron deficiency anemia, unspecified: Secondary | ICD-10-CM

## 2022-05-22 DIAGNOSIS — I6522 Occlusion and stenosis of left carotid artery: Secondary | ICD-10-CM

## 2022-05-22 DIAGNOSIS — Z7189 Other specified counseling: Secondary | ICD-10-CM

## 2022-05-22 DIAGNOSIS — K559 Vascular disorder of intestine, unspecified: Secondary | ICD-10-CM

## 2022-05-22 DIAGNOSIS — N390 Urinary tract infection, site not specified: Secondary | ICD-10-CM

## 2022-05-22 DIAGNOSIS — R202 Paresthesia of skin: Secondary | ICD-10-CM | POA: Diagnosis not present

## 2022-05-22 DIAGNOSIS — M85832 Other specified disorders of bone density and structure, left forearm: Secondary | ICD-10-CM

## 2022-05-22 DIAGNOSIS — F419 Anxiety disorder, unspecified: Secondary | ICD-10-CM

## 2022-05-22 DIAGNOSIS — M25551 Pain in right hip: Secondary | ICD-10-CM

## 2022-05-22 DIAGNOSIS — K2100 Gastro-esophageal reflux disease with esophagitis, without bleeding: Secondary | ICD-10-CM

## 2022-05-22 DIAGNOSIS — I739 Peripheral vascular disease, unspecified: Secondary | ICD-10-CM

## 2022-05-22 DIAGNOSIS — E785 Hyperlipidemia, unspecified: Secondary | ICD-10-CM

## 2022-05-22 DIAGNOSIS — R7303 Prediabetes: Secondary | ICD-10-CM

## 2022-05-22 DIAGNOSIS — E559 Vitamin D deficiency, unspecified: Secondary | ICD-10-CM

## 2022-05-22 DIAGNOSIS — D696 Thrombocytopenia, unspecified: Secondary | ICD-10-CM

## 2022-05-22 DIAGNOSIS — K582 Mixed irritable bowel syndrome: Secondary | ICD-10-CM

## 2022-05-22 DIAGNOSIS — I10 Essential (primary) hypertension: Secondary | ICD-10-CM

## 2022-05-22 DIAGNOSIS — D649 Anemia, unspecified: Secondary | ICD-10-CM

## 2022-05-22 DIAGNOSIS — Z0000 Encounter for general adult medical examination without abnormal findings: Secondary | ICD-10-CM | POA: Diagnosis not present

## 2022-05-22 MED ORDER — ASPIRIN 81 MG PO TBEC: 81.0000 mg | DELAYED_RELEASE_TABLET | Freq: Every day | ORAL | Status: DC

## 2022-05-22 MED ORDER — ONDANSETRON HCL 4 MG PO TABS: ORAL_TABLET | ORAL | 0 refills | Status: DC

## 2022-05-22 MED ORDER — ALPRAZOLAM 0.5 MG PO TABS: ORAL_TABLET | ORAL | 0 refills | Status: DC

## 2022-05-22 NOTE — Assessment & Plan Note (Signed)

## 2022-05-22 NOTE — Assessment & Plan Note (Signed)
Preventative protocols reviewed and updated unless pt declined. Discussed healthy diet and lifestyle.  

## 2022-05-22 NOTE — Progress Notes (Unsigned)
Patient ID: [name], female    DOB: 1946/09/12, 76 y.o.   MRN: [id number]  This visit was conducted in person.  BP 134/72   Pulse 60   Temp 98 F (36.7 C) (Temporal)   Ht '5\' 3"'$  (1.6 m)   Wt 149 lb (67.6 kg)   LMP 10/20/1979 (Within Years)   SpO2 96%   BMI 26.39 kg/m    CC: AMW/CPE Subjective:   HPI: Bianca Fry is a 76 y.o. female presenting on 05/22/2022 for Medicare Wellness   Did not see health advisor.  Hearing Screening   '500Hz'$  '1000Hz'$  '2000Hz'$  '4000Hz'$   Right ear 20 40 40 25  Left ear '20 20 20 '$ 0  Vision Screening - Comments:: Last eye exam, 08/2021.  [place] Visit from 05/22/2022 in [place] at [facility]  PHQ-2 Total Score 0          05/22/2022    2:31 PM 05/15/2021    8:22 AM 02/19/2021   10:56 AM 05/14/2020    8:24 AM 09/13/2019    5:38 PM  Fall Risk   Falls in the past year? 0 0 0 0 0  Comment     [facility] Telephone Survey: data to providers prior to load  Number falls in past yr:  0 0 0   Injury with Fall?  0 0 0   Risk for fall due to :  Medication side effect  Medication side effect   Follow up  Falls evaluation completed;Falls prevention discussed Falls evaluation completed Falls evaluation completed;Falls prevention discussed    Noticing worsening R hip pain. Points to groin as well as lateral hip into leg. Saw ortho, told hips were fine - told likely related to lower back. More recently leg pain is affecting ambulation. Stiffness and pain after prolonged sitting. Worse pain as the day progresses. Mobic has helped.   Husband had knee replacement 04/2022 - she has been more active caring for him.   Known L carotid stenosis. On losartan and lipitor.    Chronic rUTI and atrophic vaginitis - sees urology Bianca Fry) yearly on keflex daily. did not tolerate estrace or premarin creams.   H/o ischemic colitis and IBS alternating D/C - overall better since stopping oral iron replacement. Does better with slo-release iron.     Preventative: COLONOSCOPY Date: 03/2015 mod diverticulosis with focal colitis Bianca Fry) rpt 10 yrs  Colonoscopy 03/2021 - ischemic colitis [facility])  Well woman exam - yearly GYN Dr Bianca Fry (04/2021). S/p hysterectomy. On premarin cream.  [facility]21 [facility]  DEXA 01/2015 - T score -2.3 R hip, -1.6 spine DEXA 09/2019 -  T score -2.3 at R hip, -1.5 spine, increased hip fracture risk - tried fosamax which caused hip pain.  DEXA 10/2021 - T -1.9 L femur, -2.1 L forearm - did not meet criteria for FRAX score Continues calcium and vitamin D supplement. Encouraged regular weight bearing exercises.  Lung cancer screening - not eligible  Flu shot yearly  COVID vaccine - Pfizer 11/2019, 12/2019, booster 10/2020  Td 2004, Tdap 2012  Pneumovax 2010, Prevnar-13 2015, pnemovax 03/2016  zostavax - 07/2010  Shingrix - 01/2018, 04/2018  Advanced directive discussion - Has not set up. Would want husband to be HCPOA. [place] with CPR and intubation for possibly reversible condition, but would not want prolonged life support if terminal condition. Packet previously provided. Encouraged she continue working on this.  Seat belt use discussed.  Sunscreen use discussed, no  changing moles on skin, sees derm this month  Non smoker  Alcohol - none  Dentist - Q6 mo  Eye exam - yearly  Bowel - chronic, irregular  Bladder - no incontinence - doing well on daily ppx keflex    Daily caffeine Lives with husband and 1 dog and 3 cats Occupation: retired, was in [employer] Activity: walking 20-30 min/day  Diet: good water, fruits/vegetables daily      Relevant past medical, surgical, family and social history reviewed and updated as indicated. Interim medical history since our last visit reviewed. Allergies and medications reviewed and updated. Outpatient Medications Prior to Visit  Medication Sig Dispense Refill   atenolol (TENORMIN) 25 MG tablet TAKE ONE TABLET BY MOUTH TWICE A DAY 180 tablet 0    atorvastatin (LIPITOR) 10 MG tablet TAKE ONE TABLET BY MOUTH ONE TIME DAILY 90 tablet 2   Calcium Carb-Cholecalciferol (CALCIUM-VITAMIN D) 600-400 MG-UNIT TABS Take 1 tablet by mouth daily.     cephALEXin (KEFLEX) 250 MG capsule Take by mouth daily. Takes to prevent UTIs     cholecalciferol (VITAMIN D) 25 MCG (1000 UNIT) tablet Take 1,000 Units by mouth daily.     conjugated estrogens (PREMARIN) vaginal cream Place 1 Applicatorful vaginally daily. Use 1/2 g vaginally two or three times per week as needed to maintain symptom relief. 30 g 2   dicyclomine (BENTYL) 10 MG capsule Take 1 capsule (10 mg total) by mouth 2 (two) times daily as needed for spasms. 90 capsule 1   EPINEPHrine 0.3 mg/0.3 mL IJ SOAJ injection Inject 0.3 mg into the muscle as needed for anaphylaxis. 1 each 1   ferrous sulfate (SLOW RELEASE IRON) 160 (50 Fe) MG TBCR SR tablet Take 1 tablet (160 mg total) by mouth every Monday, Wednesday, and Friday.     fexofenadine (ALLEGRA) 180 MG tablet Take 180 mg by mouth daily as needed for allergies.     lactobacillus acidophilus (BACID) TABS tablet Take 2 tablets by mouth daily.     losartan (COZAAR) 50 MG tablet TAKE 1 & 1/2 TABLETS BY MOUTH DAILY 135 tablet 0   meloxicam (MOBIC) 7.5 MG tablet Take 7.5 mg by mouth daily.     omeprazole (PRILOSEC) 40 MG capsule TAKE ONE CAPSULE BY MOUTH ONE TIME DAILY 90 capsule 3   valACYclovir (VALTREX) 1000 MG tablet Take 2,000 mg by mouth in the morning and at bedtime. (for one day if needed for flares)     ALPRAZolam (XANAX) 0.5 MG tablet TAKE ONE TABLET BY MOUTH TWICE A DAY AS NEEDED FOR ANXIETY 40 tablet 0   ondansetron (ZOFRAN) 4 MG tablet TAKE ONE TABLET BY MOUTH EVERY 8 HOURS AS NEEDED FOR NAUSEA AND VOMITING 20 tablet 0   No facility-administered medications prior to visit.     Per HPI unless specifically indicated in ROS section below Review of Systems  Constitutional:  Negative for activity change, appetite change, chills, fatigue, fever  and unexpected weight change.  HENT:  Negative for hearing loss.   Eyes:  Negative for visual disturbance.  Respiratory:  Negative for cough, chest tightness, shortness of breath and wheezing.   Cardiovascular:  Positive for leg swelling (L foot). Negative for chest pain and palpitations.  Gastrointestinal:  Positive for constipation and diarrhea. Negative for abdominal distention, abdominal pain, blood in stool, nausea and vomiting.       Alternating   Genitourinary:  Negative for difficulty urinating and hematuria.  Musculoskeletal:  Positive for arthralgias (R leg) and back  pain. Negative for myalgias and neck pain.  Skin:  Negative for rash.  Neurological:  Negative for dizziness, seizures, syncope and headaches.  Hematological:  Negative for adenopathy. Does not bruise/bleed easily.  Psychiatric/Behavioral:  Negative for dysphoric mood. The patient is not nervous/anxious.     Objective:  BP 134/72   Pulse 60   Temp 98 F (36.7 C) (Temporal)   Ht '5\' 3"'$  (1.6 m)   Wt 149 lb (67.6 kg)   LMP 10/20/1979 (Within Years)   SpO2 96%   BMI 26.39 kg/m   Wt Readings from Last 3 Encounters:  05/22/22 149 lb (67.6 kg)  11/21/21 147 lb 8 oz (66.9 kg)  10/22/21 148 lb 2 oz (67.2 kg)      Physical Exam Vitals and nursing note reviewed.  Constitutional:      Appearance: Normal appearance. She is not ill-appearing.  HENT:     Head: Normocephalic and atraumatic.     Right Ear: Tympanic membrane, ear canal and external ear normal. There is no impacted cerumen.     Left Ear: Tympanic membrane, ear canal and external ear normal. There is no impacted cerumen.  Eyes:     General:        Right eye: No discharge.        Left eye: No discharge.     Extraocular Movements: Extraocular movements intact.     Conjunctiva/sclera: Conjunctivae normal.     Pupils: Pupils are equal, round, and reactive to light.  Neck:     Thyroid: No thyroid mass or thyromegaly.     Vascular: No carotid bruit.   Cardiovascular:     Rate and Rhythm: Normal rate and regular rhythm.     Pulses: Normal pulses.     Heart sounds: Normal heart sounds. No murmur heard. Pulmonary:     Effort: Pulmonary effort is normal. No respiratory distress.     Breath sounds: Normal breath sounds. No wheezing, rhonchi or rales.  Abdominal:     General: Bowel sounds are normal. There is no distension.     Palpations: Abdomen is soft. There is no mass.     Tenderness: There is no abdominal tenderness. There is no guarding or rebound.     Hernia: No hernia is present.  Musculoskeletal:     Cervical back: Normal range of motion and neck supple. No rigidity.     Right lower leg: No edema.     Left lower leg: No edema.     Comments:  No pain midline spine Neg SLR bilaterally. No pain with int/ext rotation at hip. Neg FABER. + discomfort to palpation at right sciatic notch and GTB area.  No pain at bilateral SIJ, or left GTB or sciatic notch.   Lymphadenopathy:     Cervical: No cervical adenopathy.  Skin:    General: Skin is warm and dry.     Findings: No rash.  Neurological:     General: No focal deficit present.     Mental Status: She is alert. Mental status is at baseline.  Psychiatric:        Mood and Affect: Mood normal.        Behavior: Behavior normal.       Results for orders placed or performed in visit on 05/22/22  Vitamin B12  Result Value Ref Range   Vitamin B-12 218 200 - 1,100 pg/mL   Lab Results  Component Value Date   CHOL 134 05/15/2022   HDL 48.20 05/15/2022  LDLCALC 58 05/15/2022   LDLDIRECT 103.0 04/20/2018   TRIG 139.0 05/15/2022   CHOLHDL 3 05/15/2022    Lab Results  Component Value Date   CREATININE 0.86 05/15/2022   BUN 20 05/15/2022   NA 140 05/15/2022   K 4.0 05/15/2022   CL 106 05/15/2022   CO2 27 05/15/2022    Lab Results  Component Value Date   WBC 7.1 05/15/2022   HGB 11.5 (L) 05/15/2022   HCT 34.1 (L) 05/15/2022   MCV 86.1 05/15/2022   PLT 129.0 (L)  05/15/2022    Lab Results  Component Value Date   TSH 3.02 11/14/2020   Lab Results  Component Value Date   VD25OH 32.90 05/15/2022    Lab Results  Component Value Date   IRON 77 11/21/2021   TIBC 333.2 11/21/2021   FERRITIN 30.6 05/15/2022    Lab Results  Component Value Date   HGBA1C 6.3 05/15/2022    Assessment & Fry:   Problem List Items Addressed This Visit     Medicare annual wellness visit, subsequent - Primary (Chronic)    I have personally reviewed the Medicare Annual Wellness questionnaire and have noted 1. The patient's medical and social history 2. Their use of alcohol, tobacco or illicit drugs 3. Their current medications and supplements 4. The patient's functional ability including ADL's, fall risks, home safety risks and hearing or visual impairment. Cognitive function has been assessed and addressed as indicated.  5. Diet and physical activity 6. Evidence for depression or mood disorders The patients weight, height, BMI have been recorded in the chart. I have made referrals, counseling and provided education to the patient based on review of the above and I have provided the pt with a written personalized care Fry for preventive services. Provider list updated.. See scanned questionairre as needed for further documentation. Reviewed preventative protocols and updated unless pt declined.       Advanced care planning/counseling discussion (Chronic)    Advanced directive discussion - Has not set up. Would want husband to be HCPOA. [place] with CPR and intubation for possibly reversible condition, but would not want prolonged life support if terminal condition. Packet previously provided. Encouraged she continue working on this.       Health maintenance examination (Chronic)    Preventative protocols reviewed and updated unless pt declined. Discussed healthy diet and lifestyle.       HLD (hyperlipidemia)    Chronic, stable on current regimen of low dose  statin - continue. The 10-year ASCVD risk score (Arnett DK, et al., 2019) is: 27.3%   Values used to calculate the score:     Age: 78 years     Sex: Female     Is Non-Hispanic African American: No     Diabetic: No     Tobacco smoker: No     Systolic Blood Pressure: 267 mmHg     Is BP treated: Yes     HDL Cholesterol: 48.2 mg/dL     Total Cholesterol: 134 mg/dL       Relevant Medications   aspirin EC 81 MG tablet   Iron deficiency anemia    Ferritin remains normal. She continues slow release oral iron MWF      Relevant Medications   Cyanocobalamin (B-12) 1000 MCG SUBL   Essential hypertension    Chronic, stable on current regimen of losartan and atenolol.       Relevant Medications   aspirin EC 81 MG tablet   GERD  Continues daily omeprazole.       Relevant Medications   ondansetron (ZOFRAN) 4 MG tablet   Recurrent UTI    Sees urology yearly Bianca Fry), stable period on ppx keflex.       Right hip pain    Story/exam most consistent with greater trochanteric pain syndrome.  Reviewed exercises and conservative treatment measures.  Handout on exercises for hip bursitis provided.  Update if not improving with treatment.  She will also continue mobic PRN.      Ischemic colitis ([place])    Chronic issue, continue low dose aspirin and statin.       Left carotid artery stenosis    Minimal on [place] earlier this year.       Relevant Medications   aspirin EC 81 MG tablet   Prediabetes    Discussed limiting added sugars in diet.      Vitamin D deficiency    Continue vitamin D 1000 IU daily + cal/vit D.       Irritable bowel syndrome (IBS)   Relevant Medications   ondansetron (ZOFRAN) 4 MG tablet   Osteopenia    Reviewed latest DEXA 10/2021. No FRAX score completed, however prior DEXA 2020 with elevated hip fracture risk. Fosamax trial caused hip pain so now off bone strengthening phamacotherapy. Continue cal, vit D, regular weight bearing exercise.       Anxiety     Continue PRN xanax.       Relevant Medications   ALPRAZolam (XANAX) 0.5 MG tablet   PAD (peripheral artery disease) (HCC)    Continue statin, aspirin.       Relevant Medications   aspirin EC 81 MG tablet   Paresthesia of left foot    Overall stable exam. Check b12 levels.      Relevant Orders   Vitamin B12 (Completed)   Thrombocytopenia (HCC)    Recent decreasing platelet trend - will continue to monitor. Denies easy bruising/bleeding.       Anemia    ?IDA related - continue oral iron replacement.       Relevant Medications   Cyanocobalamin (B-12) 1000 MCG SUBL     Meds ordered this encounter  Medications   ALPRAZolam (XANAX) 0.5 MG tablet    Sig: TAKE ONE TABLET BY MOUTH TWICE A DAY AS NEEDED FOR ANXIETY    Dispense:  40 tablet    Refill:  0   ondansetron (ZOFRAN) 4 MG tablet    Sig: TAKE ONE TABLET BY MOUTH EVERY 8 HOURS AS NEEDED FOR NAUSEA AND VOMITING    Dispense:  20 tablet    Refill:  0   aspirin EC 81 MG tablet    Sig: Take 1 tablet (81 mg total) by mouth daily. Swallow whole.   Cyanocobalamin (B-12) 1000 MCG SUBL    Sig: Place 1 tablet under the tongue daily.   Orders Placed This Encounter  Procedures   Vitamin B12     Patient instructions: Finish advanced directive, bring me a copy Exam today suspicious for greater trochanteric pain - do exercises provided today including standing lateral leg raise.  Good to se you today Return as needed or in 6 months for follow up visit.   Follow up Fry: Return in about 6 months (around 11/22/2022), or if symptoms worsen or fail to improve, for follow up visit.  Bianca Bush, MD

## 2022-05-22 NOTE — Patient Instructions (Addendum)
Finish advanced directive, bring me a copy Exam today suspicious for greater trochanteric pain - do exercises provided today including standing lateral leg raise.  Good to se you today Return as needed or in 6 months for follow up visit.   Health Maintenance After Age 77 After age 74, you are at a higher risk for certain long-term diseases and infections as well as injuries from falls. Falls are a major cause of broken bones and head injuries in people who are older than age 76. Getting regular preventive care can help to keep you healthy and well. Preventive care includes getting regular testing and making lifestyle changes as recommended by your health care provider. Talk with your health care provider about: Which screenings and tests you should have. A screening is a test that checks for a disease when you have no symptoms. A diet and exercise plan that is right for you. What should I know about screenings and tests to prevent falls? Screening and testing are the best ways to find a health problem early. Early diagnosis and treatment give you the best chance of managing medical conditions that are common after age 34. Certain conditions and lifestyle choices may make you more likely to have a fall. Your health care provider may recommend: Regular vision checks. Poor vision and conditions such as cataracts can make you more likely to have a fall. If you wear glasses, make sure to get your prescription updated if your vision changes. Medicine review. Work with your health care provider to regularly review all of the medicines you are taking, including over-the-counter medicines. Ask your health care provider about any side effects that may make you more likely to have a fall. Tell your health care provider if any medicines that you take make you feel dizzy or sleepy. Strength and balance checks. Your health care provider may recommend certain tests to check your strength and balance while standing,  walking, or changing positions. Foot health exam. Foot pain and numbness, as well as not wearing proper footwear, can make you more likely to have a fall. Screenings, including: Osteoporosis screening. Osteoporosis is a condition that causes the bones to get weaker and break more easily. Blood pressure screening. Blood pressure changes and medicines to control blood pressure can make you feel dizzy. Depression screening. You may be more likely to have a fall if you have a fear of falling, feel depressed, or feel unable to do activities that you used to do. Alcohol use screening. Using too much alcohol can affect your balance and may make you more likely to have a fall. Follow these instructions at home: Lifestyle Do not drink alcohol if: Your health care provider tells you not to drink. If you drink alcohol: Limit how much you have to: 0-1 drink a day for women. 0-2 drinks a day for men. Know how much alcohol is in your drink. In the [place] one drink equals one 12 oz bottle of beer (355 mL), one 5 oz glass of wine (148 mL), or one 1 oz glass of hard liquor (44 mL). Do not use any products that contain nicotine or tobacco. These products include cigarettes, chewing tobacco, and vaping devices, such as e-cigarettes. If you need help quitting, ask your health care provider. Activity  Follow a regular exercise program to stay fit. This will help you maintain your balance. Ask your health care provider what types of exercise are appropriate for you. If you need a cane or walker, use it as  recommended by your health care provider. Wear supportive shoes that have nonskid soles. Safety  Remove any tripping hazards, such as rugs, cords, and clutter. Install safety equipment such as grab bars in bathrooms and safety rails on stairs. Keep rooms and walkways well-lit. General instructions Talk with your health care provider about your risks for falling. Tell your health care provider if: You fall.  Be sure to tell your health care provider about all falls, even ones that seem minor. You feel dizzy, tiredness (fatigue), or off-balance. Take over-the-counter and prescription medicines only as told by your health care provider. These include supplements. Eat a healthy diet and maintain a healthy weight. A healthy diet includes low-fat dairy products, low-fat (lean) meats, and fiber from whole grains, beans, and lots of fruits and vegetables. Stay current with your vaccines. Schedule regular health, dental, and eye exams. Summary Having a healthy lifestyle and getting preventive care can help to protect your health and wellness after age 53. Screening and testing are the best way to find a health problem early and help you avoid having a fall. Early diagnosis and treatment give you the best chance for managing medical conditions that are more common for people who are older than age 58. Falls are a major cause of broken bones and head injuries in people who are older than age 43. Take precautions to prevent a fall at home. Work with your health care provider to learn what changes you can make to improve your health and wellness and to prevent falls. This information is not intended to replace advice given to you by your health care provider. Make sure you discuss any questions you have with your health care provider. Document Revised: 02/24/2021 Document Reviewed: 02/24/2021 Elsevier Patient Education  [place].

## 2022-05-22 NOTE — Assessment & Plan Note (Addendum)
Advanced directive discussion - Has not set up. Would want husband to be HCPOA. [place] with CPR and intubation for possibly reversible condition, but would not want prolonged life support if terminal condition. Packet previously provided. Encouraged she continue working on this.

## 2022-05-23 ENCOUNTER — Encounter: Payer: Self-pay | Admitting: Family Medicine

## 2022-05-23 DIAGNOSIS — R202 Paresthesia of skin: Secondary | ICD-10-CM | POA: Insufficient documentation

## 2022-05-23 DIAGNOSIS — D649 Anemia, unspecified: Secondary | ICD-10-CM | POA: Insufficient documentation

## 2022-05-23 DIAGNOSIS — D696 Thrombocytopenia, unspecified: Secondary | ICD-10-CM | POA: Insufficient documentation

## 2022-05-23 LAB — VITAMIN B12: Vitamin B-12: 218 pg/mL (ref 200–1100)

## 2022-05-23 MED ORDER — B-12 1000 MCG SL SUBL: 1.0000 | SUBLINGUAL_TABLET | Freq: Every day | SUBLINGUAL | Status: DC

## 2022-05-23 NOTE — Assessment & Plan Note (Addendum)
Sees urology yearly Bianca Fry), stable period on ppx keflex.

## 2022-05-23 NOTE — Assessment & Plan Note (Signed)
Overall stable exam. Check b12 levels.

## 2022-05-23 NOTE — Assessment & Plan Note (Signed)
Chronic issue, continue low dose aspirin and statin.

## 2022-05-23 NOTE — Assessment & Plan Note (Signed)
Continue statin, aspirin 

## 2022-05-23 NOTE — Assessment & Plan Note (Addendum)
Reviewed latest DEXA 10/2021. No FRAX score completed, however prior DEXA 2020 with elevated hip fracture risk. Fosamax trial caused hip pain so now off bone strengthening phamacotherapy. Continue cal, vit D, regular weight bearing exercise.

## 2022-05-23 NOTE — Assessment & Plan Note (Signed)
?  IDA related - continue oral iron replacement.

## 2022-05-23 NOTE — Assessment & Plan Note (Signed)
Recent decreasing platelet trend - will continue to monitor. Denies easy bruising/bleeding.

## 2022-05-23 NOTE — Assessment & Plan Note (Signed)
Discussed limiting added sugars in diet.

## 2022-05-23 NOTE — Assessment & Plan Note (Addendum)
Story/exam most consistent with greater trochanteric pain syndrome.  Reviewed exercises and conservative treatment measures.  Handout on exercises for hip bursitis provided.  Update if not improving with treatment.  She will also continue mobic PRN.

## 2022-05-23 NOTE — Assessment & Plan Note (Signed)
Minimal on [place] earlier this year.

## 2022-05-23 NOTE — Assessment & Plan Note (Addendum)
Continue vitamin D 1000 IU daily + cal/vit D.

## 2022-05-23 NOTE — Assessment & Plan Note (Signed)
Continue PRN xanax.

## 2022-05-23 NOTE — Assessment & Plan Note (Signed)
Chronic, stable on current regimen of low dose statin - continue. The 10-year ASCVD risk score (Arnett DK, et al., 2019) is: 27.3%   Values used to calculate the score:     Age: 76 years     Sex: Female     Is Non-Hispanic African American: No     Diabetic: No     Tobacco smoker: No     Systolic Blood Pressure: 343 mmHg     Is BP treated: Yes     HDL Cholesterol: 48.2 mg/dL     Total Cholesterol: 134 mg/dL

## 2022-05-23 NOTE — Assessment & Plan Note (Signed)
Chronic, stable on current regimen of losartan and atenolol.

## 2022-05-23 NOTE — Assessment & Plan Note (Signed)
Ferritin remains normal. She continues slow release oral iron MWF

## 2022-05-23 NOTE — Assessment & Plan Note (Signed)
Continues daily omeprazole.  

## 2022-05-27 ENCOUNTER — Ambulatory Visit (INDEPENDENT_AMBULATORY_CARE_PROVIDER_SITE_OTHER): Payer: PPO

## 2022-05-27 VITALS — Wt 149.0 lb

## 2022-05-27 DIAGNOSIS — Z0000 Encounter for general adult medical examination without abnormal findings: Secondary | ICD-10-CM | POA: Diagnosis not present
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
## 2022-05-27 NOTE — Patient Instructions (Signed)
Bianca Fry , Thank you for taking time to come for your Medicare Wellness Visit. I appreciate your ongoing commitment to your health goals. Please review the following plan we discussed and let me know if I can assist you in the future.   Screening recommendations/referrals: Colonoscopy: aged out Mammogram: aged out Bone Density: 11/10/21 Recommended yearly ophthalmology/optometry visit for glaucoma screening and checkup Recommended yearly dental visit for hygiene and checkup  Vaccinations: Influenza vaccine: 07/25/21 Pneumococcal vaccine: 04/15/16 Tdap vaccine: 04/09/11, due if have injury Shingles vaccine: Shingrix 02/04/18, 05/17/18    Zostavax 08/04/10 Covid-19:12/08/19, 12/29/19, 10/23/20  Advanced directives: no  Conditions/risks identified: none  Next appointment: Follow up in one year for your annual wellness visit 05/31/23 @ 8:15 am by phone   Preventive Care 65 Years and Older, Female Preventive care refers to lifestyle choices and visits with your health care provider that can promote health and wellness. What does preventive care include? A yearly physical exam. This is also called an annual well check. Dental exams once or twice a year. Routine eye exams. Ask your health care provider how often you should have your eyes checked. Personal lifestyle choices, including: Daily care of your teeth and gums. Regular physical activity. Eating a healthy diet. Avoiding tobacco and drug use. Limiting alcohol use. Practicing safe sex. Taking low-dose aspirin every day. Taking vitamin and mineral supplements as recommended by your health care provider. What happens during an annual well check? The services and screenings done by your health care provider during your annual well check will depend on your age, overall health, lifestyle risk factors, and family history of disease. Counseling  Your health care provider may ask you questions about your: Alcohol use. Tobacco use. Drug  use. Emotional well-being. Home and relationship well-being. Sexual activity. Eating habits. History of falls. Memory and ability to understand (cognition). Work and work Statistician. Reproductive health. Screening  You may have the following tests or measurements: Height, weight, and BMI. Blood pressure. Lipid and cholesterol levels. These may be checked every 5 years, or more frequently if you are over 11 years old. Skin check. Lung cancer screening. You may have this screening every year starting at age 33 if you have a 30-pack-year history of smoking and currently smoke or have quit within the past 15 years. Fecal occult blood test (FOBT) of the stool. You may have this test every year starting at age 62. Flexible sigmoidoscopy or colonoscopy. You may have a sigmoidoscopy every 5 years or a colonoscopy every 10 years starting at age 59. Hepatitis C blood test. Hepatitis B blood test. Sexually transmitted disease (STD) testing. Diabetes screening. This is done by checking your blood sugar (glucose) after you have not eaten for a while (fasting). You may have this done every 1-3 years. Bone density scan. This is done to screen for osteoporosis. You may have this done starting at age 43. Mammogram. This may be done every 1-2 years. Talk to your health care provider about how often you should have regular mammograms. Talk with your health care provider about your test results, treatment options, and if necessary, the need for more tests. Vaccines  Your health care provider may recommend certain vaccines, such as: Influenza vaccine. This is recommended every year. Tetanus, diphtheria, and acellular pertussis (Tdap, Td) vaccine. You may need a Td booster every 10 years. Zoster vaccine. You may need this after age 77. Pneumococcal 13-valent conjugate (PCV13) vaccine. One dose is recommended after age 39. Pneumococcal polysaccharide (PPSV23) vaccine. One  dose is recommended after age  24. Talk to your health care provider about which screenings and vaccines you need and how often you need them. This information is not intended to replace advice given to you by your health care provider. Make sure you discuss any questions you have with your health care provider. Document Released: 11/01/2015 Document Revised: 06/24/2016 Document Reviewed: 08/06/2015 Elsevier Interactive Patient Education  2017 [employer] Prevention in the Home Falls can cause injuries. They can happen to people of all ages. There are many things you can do to make your home safe and to help prevent falls. What can I do on the outside of my home? Regularly fix the edges of walkways and driveways and fix any cracks. Remove anything that might make you trip as you walk through a door, such as a raised step or threshold. Trim any bushes or trees on the path to your home. Use bright outdoor lighting. Clear any walking paths of anything that might make someone trip, such as rocks or tools. Regularly check to see if handrails are loose or broken. Make sure that both sides of any steps have handrails. Any raised decks and porches should have guardrails on the edges. Have any leaves, snow, or ice cleared regularly. Use sand or salt on walking paths during winter. Clean up any spills in your garage right away. This includes oil or grease spills. What can I do in the bathroom? Use night lights. Install grab bars by the toilet and in the tub and shower. Do not use towel bars as grab bars. Use non-skid mats or decals in the tub or shower. If you need to sit down in the shower, use a plastic, non-slip stool. Keep the floor dry. Clean up any water that spills on the floor as soon as it happens. Remove soap buildup in the tub or shower regularly. Attach bath mats securely with double-sided non-slip rug tape. Do not have throw rugs and other things on the floor that can make you trip. What can I do in the  bedroom? Use night lights. Make sure that you have a light by your bed that is easy to reach. Do not use any sheets or blankets that are too big for your bed. They should not hang down onto the floor. Have a firm chair that has side arms. You can use this for support while you get dressed. Do not have throw rugs and other things on the floor that can make you trip. What can I do in the kitchen? Clean up any spills right away. Avoid walking on wet floors. Keep items that you use a lot in easy-to-reach places. If you need to reach something above you, use a strong step stool that has a grab bar. Keep electrical cords out of the way. Do not use floor polish or wax that makes floors slippery. If you must use wax, use non-skid floor wax. Do not have throw rugs and other things on the floor that can make you trip. What can I do with my stairs? Do not leave any items on the stairs. Make sure that there are handrails on both sides of the stairs and use them. Fix handrails that are broken or loose. Make sure that handrails are as long as the stairways. Check any carpeting to make sure that it is firmly attached to the stairs. Fix any carpet that is loose or worn. Avoid having throw rugs at the top or bottom of the  stairs. If you do have throw rugs, attach them to the floor with carpet tape. Make sure that you have a light switch at the top of the stairs and the bottom of the stairs. If you do not have them, ask someone to add them for you. What else can I do to help prevent falls? Wear shoes that: Do not have high heels. Have rubber bottoms. Are comfortable and fit you well. Are closed at the toe. Do not wear sandals. If you use a stepladder: Make sure that it is fully opened. Do not climb a closed stepladder. Make sure that both sides of the stepladder are locked into place. Ask someone to hold it for you, if possible. Clearly mark and make sure that you can see: Any grab bars or  handrails. First and last steps. Where the edge of each step is. Use tools that help you move around (mobility aids) if they are needed. These include: Canes. Walkers. Scooters. Crutches. Turn on the lights when you go into a dark area. Replace any light bulbs as soon as they burn out. Set up your furniture so you have a clear path. Avoid moving your furniture around. If any of your floors are uneven, fix them. If there are any pets around you, be aware of where they are. Review your medicines with your doctor. Some medicines can make you feel dizzy. This can increase your chance of falling. Ask your doctor what other things that you can do to help prevent falls. This information is not intended to replace advice given to you by your health care provider. Make sure you discuss any questions you have with your health care provider. Document Released: 08/01/2009 Document Revised: 03/12/2016 Document Reviewed: 11/09/2014 Elsevier Interactive Patient Education  2017 [employer].

## 2022-06-01 DIAGNOSIS — H02051 Trichiasis without entropian right upper eyelid: Secondary | ICD-10-CM | POA: Diagnosis not present

## 2022-06-02 DIAGNOSIS — L821 Other seborrheic keratosis: Secondary | ICD-10-CM | POA: Diagnosis not present

## 2022-06-02 DIAGNOSIS — Z85828 Personal history of other malignant neoplasm of skin: Secondary | ICD-10-CM | POA: Diagnosis not present

## 2022-06-02 DIAGNOSIS — L245 Irritant contact dermatitis due to other chemical products: Secondary | ICD-10-CM | POA: Diagnosis not present

## 2022-06-02 DIAGNOSIS — L814 Other melanin hyperpigmentation: Secondary | ICD-10-CM | POA: Diagnosis not present
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
## 2022-06-04 ENCOUNTER — Ambulatory Visit: Payer: PPO | Admitting: Obstetrics and Gynecology

## 2022-06-04 ENCOUNTER — Ambulatory Visit (INDEPENDENT_AMBULATORY_CARE_PROVIDER_SITE_OTHER): Payer: PPO | Admitting: Obstetrics and Gynecology

## 2022-06-04 ENCOUNTER — Encounter: Payer: Self-pay | Admitting: Obstetrics and Gynecology

## 2022-06-04 VITALS — BP 160/72 | HR 67 | Ht 63.0 in | Wt 148.0 lb

## 2022-06-04 DIAGNOSIS — Z5181 Encounter for therapeutic drug level monitoring: Secondary | ICD-10-CM

## 2022-06-04 DIAGNOSIS — Z9189 Other specified personal risk factors, not elsewhere classified: Secondary | ICD-10-CM

## 2022-06-04 DIAGNOSIS — N952 Postmenopausal atrophic vaginitis: Secondary | ICD-10-CM

## 2022-06-04 DIAGNOSIS — B009 Herpesviral infection, unspecified: Secondary | ICD-10-CM | POA: Diagnosis not present

## 2022-06-04 DIAGNOSIS — Z01419 Encounter for gynecological examination (general) (routine) without abnormal findings: Secondary | ICD-10-CM

## 2022-06-04 MED ORDER — PREMARIN 0.625 MG/GM VA CREA
TOPICAL_CREAM | VAGINAL | 2 refills | Status: DC
Start: 2022-06-04 — End: 2023-06-10

## 2022-06-04 MED ORDER — VALACYCLOVIR HCL 500 MG PO TABS: ORAL_TABLET | ORAL | 5 refills | Status: DC

## 2022-06-04 NOTE — Patient Instructions (Signed)

## 2022-06-09 DIAGNOSIS — H25812 Combined forms of age-related cataract, left eye: Secondary | ICD-10-CM | POA: Diagnosis not present

## 2022-06-09 DIAGNOSIS — H2512 Age-related nuclear cataract, left eye: Secondary | ICD-10-CM | POA: Diagnosis not present

## 2022-06-09 DIAGNOSIS — H25012 Cortical age-related cataract, left eye: Secondary | ICD-10-CM | POA: Diagnosis not present

## 2022-06-09 DIAGNOSIS — H25042 Posterior subcapsular polar age-related cataract, left eye: Secondary | ICD-10-CM | POA: Diagnosis not present

## 2022-06-16 DIAGNOSIS — H2512 Age-related nuclear cataract, left eye: Secondary | ICD-10-CM | POA: Diagnosis not present

## 2022-06-29 DIAGNOSIS — Z6826 Body mass index (BMI) 26.0-26.9, adult: Secondary | ICD-10-CM | POA: Diagnosis not present

## 2022-06-29 DIAGNOSIS — M7061 Trochanteric bursitis, right hip: Secondary | ICD-10-CM | POA: Diagnosis not present

## 2022-06-29 DIAGNOSIS — M47816 Spondylosis without myelopathy or radiculopathy, lumbar region: Secondary | ICD-10-CM | POA: Diagnosis not present

## 2022-07-02 ENCOUNTER — Other Ambulatory Visit: Payer: Self-pay | Admitting: Family Medicine

## 2022-07-02 DIAGNOSIS — M5127 Other intervertebral disc displacement, lumbosacral region: Secondary | ICD-10-CM | POA: Diagnosis not present

## 2022-07-02 DIAGNOSIS — M47816 Spondylosis without myelopathy or radiculopathy, lumbar region: Secondary | ICD-10-CM | POA: Diagnosis not present

## 2022-07-02 DIAGNOSIS — I10 Essential (primary) hypertension: Secondary | ICD-10-CM

## 2022-07-03 DIAGNOSIS — M47816 Spondylosis without myelopathy or radiculopathy, lumbar region: Secondary | ICD-10-CM | POA: Diagnosis not present

## 2022-07-03 DIAGNOSIS — N390 Urinary tract infection, site not specified: Secondary | ICD-10-CM | POA: Diagnosis not present

## 2022-07-09 ENCOUNTER — Other Ambulatory Visit: Payer: Self-pay | Admitting: Family Medicine

## 2022-07-09 DIAGNOSIS — R350 Frequency of micturition: Secondary | ICD-10-CM | POA: Diagnosis not present

## 2022-07-09 DIAGNOSIS — N952 Postmenopausal atrophic vaginitis: Secondary | ICD-10-CM | POA: Diagnosis not present

## 2022-07-09 DIAGNOSIS — R3915 Urgency of urination: Secondary | ICD-10-CM | POA: Diagnosis not present

## 2022-07-09 DIAGNOSIS — Z9849 Cataract extraction status, unspecified eye: Secondary | ICD-10-CM | POA: Diagnosis not present

## 2022-07-09 DIAGNOSIS — R300 Dysuria: Secondary | ICD-10-CM | POA: Diagnosis not present

## 2022-07-09 DIAGNOSIS — Z87440 Personal history of urinary (tract) infections: Secondary | ICD-10-CM | POA: Diagnosis not present

## 2022-07-09 NOTE — Telephone Encounter (Signed)
ERx 

## 2022-07-09 NOTE — Telephone Encounter (Signed)
Name of Medication: Alprazolam Name of Pharmacy: [facility] or Written Date and Quantity: 05/22/22, #40 Last Office Visit and Type: 05/22/22, AWV Next Office Visit and Type: 11/23/22, 6 mo f/u Last Controlled Substance Agreement Date: 04/16/17 Last UDS: 04/16/17

## 2022-07-16 DIAGNOSIS — M5416 Radiculopathy, lumbar region: Secondary | ICD-10-CM | POA: Diagnosis not present

## 2022-08-01 ENCOUNTER — Other Ambulatory Visit: Payer: Self-pay | Admitting: Family Medicine

## 2022-08-01 DIAGNOSIS — K2100 Gastro-esophageal reflux disease with esophagitis, without bleeding: Secondary | ICD-10-CM

## 2022-08-05 ENCOUNTER — Ambulatory Visit: Payer: PPO

## 2022-08-06 ENCOUNTER — Ambulatory Visit (INDEPENDENT_AMBULATORY_CARE_PROVIDER_SITE_OTHER): Payer: PPO

## 2022-08-06 DIAGNOSIS — Z23 Encounter for immunization: Secondary | ICD-10-CM | POA: Diagnosis not present

## 2022-08-06 DIAGNOSIS — H02051 Trichiasis without entropian right upper eyelid: Secondary | ICD-10-CM | POA: Diagnosis not present

## 2022-08-12 DIAGNOSIS — M5416 Radiculopathy, lumbar region: Secondary | ICD-10-CM | POA: Diagnosis not present

## 2022-08-25 ENCOUNTER — Other Ambulatory Visit: Payer: Self-pay | Admitting: Family Medicine

## 2022-08-25 NOTE — Telephone Encounter (Signed)
Name of Medication: Alprazolam Name of Pharmacy: [facility] or Written Date and Quantity: 07/09/22, #40 Last Office Visit and Type: 05/22/22, AWV Next Office Visit and Type: 11/23/22, 6 mo f/u Last Controlled Substance Agreement Date: 04/16/17 Last UDS: 04/16/17  Zofran last filled:  05/22/22, #20.

## 2022-08-26 NOTE — Telephone Encounter (Signed)
ERx 

## 2022-08-28 ENCOUNTER — Other Ambulatory Visit: Payer: Self-pay | Admitting: Family Medicine

## 2022-08-28 DIAGNOSIS — Z1231 Encounter for screening mammogram for malignant neoplasm of breast: Secondary | ICD-10-CM

## 2022-09-16 DIAGNOSIS — H61001 Unspecified perichondritis of right external ear: Secondary | ICD-10-CM | POA: Diagnosis not present

## 2022-09-16 DIAGNOSIS — L570 Actinic keratosis: Secondary | ICD-10-CM | POA: Diagnosis not present

## 2022-09-16 DIAGNOSIS — Z85828 Personal history of other malignant neoplasm of skin: Secondary | ICD-10-CM | POA: Diagnosis not present

## 2022-09-16 DIAGNOSIS — D692 Other nonthrombocytopenic purpura: Secondary | ICD-10-CM | POA: Diagnosis not present

## 2022-09-16 DIAGNOSIS — L821 Other seborrheic keratosis: Secondary | ICD-10-CM | POA: Diagnosis not present

## 2022-09-16 DIAGNOSIS — L858 Other specified epidermal thickening: Secondary | ICD-10-CM | POA: Diagnosis not present

## 2022-09-16 DIAGNOSIS — L814 Other melanin hyperpigmentation: Secondary | ICD-10-CM | POA: Diagnosis not present

## 2022-09-16 DIAGNOSIS — D1801 Hemangioma of skin and subcutaneous tissue: Secondary | ICD-10-CM | POA: Diagnosis not present

## 2022-09-23 ENCOUNTER — Encounter: Payer: Self-pay | Admitting: Family Medicine

## 2022-09-23 ENCOUNTER — Ambulatory Visit (INDEPENDENT_AMBULATORY_CARE_PROVIDER_SITE_OTHER): Payer: PPO | Admitting: Family Medicine

## 2022-09-23 VITALS — BP 132/64 | HR 61 | Temp 97.5°F | Ht 63.0 in | Wt 145.0 lb

## 2022-09-23 DIAGNOSIS — R1012 Left upper quadrant pain: Secondary | ICD-10-CM | POA: Diagnosis not present

## 2022-09-23 DIAGNOSIS — M7989 Other specified soft tissue disorders: Secondary | ICD-10-CM | POA: Diagnosis not present

## 2022-09-23 DIAGNOSIS — R233 Spontaneous ecchymoses: Secondary | ICD-10-CM

## 2022-09-23 DIAGNOSIS — G8929 Other chronic pain: Secondary | ICD-10-CM | POA: Diagnosis not present

## 2022-09-23 DIAGNOSIS — R1030 Lower abdominal pain, unspecified: Secondary | ICD-10-CM

## 2022-09-23 DIAGNOSIS — M79662 Pain in left lower leg: Secondary | ICD-10-CM | POA: Diagnosis not present

## 2022-09-23 LAB — POC URINALSYSI DIPSTICK (AUTOMATED)
Glucose, UA: NEGATIVE
Ketones, UA: NEGATIVE
Nitrite, UA: NEGATIVE
Protein, UA: POSITIVE — AB
Spec Grav, UA: 1.02 (ref 1.010–1.025)
Urobilinogen, UA: 0.2 E.U./dL
pH, UA: 6 (ref 5.0–8.0)

## 2022-09-23 LAB — D-DIMER, QUANTITATIVE: D-Dimer, Quant: 0.47 mcg/mL FEU (ref <0.50)

## 2022-09-23 NOTE — Progress Notes (Unsigned)
Patient ID: Bianca Fry, female    DOB: 05/26/1946, 76 y.o.   MRN: [id number]  This visit was conducted in person.  BP 132/64   Pulse 61   Temp (!) 97.5 F (36.4 C) (Temporal)   Ht '5\' 3"'$  (1.6 m)   Wt 154 lb 3.2 oz (69.9 kg)   LMP 10/20/1979 (Within Years)   SpO2 96%   BMI 27.32 kg/m    CC: left leg swelling, abdominal pain  Subjective:   HPI: Bianca Fry is a 76 y.o. female presenting on 09/23/2022 for Leg Swelling (C/o L leg/ankle swelling.  Started 09/20/22. Denies pain. ) and Abdominal Pain (C/o occasional LUQ abd pain.  Started yrs ago. Denies N/V/D.)   4d ago L foot swelling started, this did improve today. She notes swelling worse at night time, better when she wakes up. Associated with posterior left thigh tightness. She always sleeps with pillow under feet. No redness or warmth of leg. No recent prolonged periods of immobility ,car rides, plane rides. No personal hx blood clot. Sister has had blood clots. No chest pain or shortness of breath. Denies inciting trauma/injury or fall.   Chronic intermittent LUQ abdominal pain under ribcage ongoing for years. Workup has included GI evaluation with colonoscopy and imaging studies including vascular CT angiogram. She's had infectious colitis in the past. Colonoscopy 03/2021 consistent with ischemic colitis. Dicyclomine helps some  she's restarted taking this twice daily. She's not tried IBguard.   No fevers/chills.  Notes several weeks of increased suprapubic ache with urination, internal itching, urgency and frequency without hematuria, nausea/vomiting.  She is on keflex '250mg'$  daily for UTI ppx.  She sees [facility] Amalia Hailey) for recurrent UTIs - last seen 06/2022.  Mannitose hasn't helped, probiotics didn't help.  Colonoscopy 03/2021 - diverticulosis, int hem, biopsy consistent with ischemic colitis [facility])     Relevant past medical, surgical, family and social history reviewed and updated as indicated. Interim medical  history since our last visit reviewed. Allergies and medications reviewed and updated. Outpatient Medications Prior to Visit  Medication Sig Dispense Refill   ALPRAZolam (XANAX) 0.5 MG tablet TAKE ONE TABLET BY MOUTH TWICE A DAY AS NEEDED FOR ANXIETY 40 tablet 0   aspirin EC 81 MG tablet Take 1 tablet (81 mg total) by mouth daily. Swallow whole.     atenolol (TENORMIN) 25 MG tablet TAKE ONE TABLET BY MOUTH TWICE A DAY 180 tablet 3   atorvastatin (LIPITOR) 10 MG tablet TAKE ONE TABLET BY MOUTH ONE TIME DAILY 90 tablet 2   Calcium Carb-Cholecalciferol (CALCIUM-VITAMIN D) 600-400 MG-UNIT TABS Take 1 tablet by mouth daily.     cephALEXin (KEFLEX) 250 MG capsule Take by mouth daily. Takes to prevent UTIs     cholecalciferol (VITAMIN D) 25 MCG (1000 UNIT) tablet Take 1,000 Units by mouth daily.     conjugated estrogens (PREMARIN) vaginal cream Use 1/2 g vaginally and place a pea size amount to urethra two or three times per week as needed to maintain symptom relief. 30 g 2   Cyanocobalamin (B-12) 1000 MCG SUBL Place 1 tablet under the tongue daily.     dicyclomine (BENTYL) 10 MG capsule Take 1 capsule (10 mg total) by mouth 2 (two) times daily as needed for spasms. 90 capsule 1   EPINEPHrine 0.3 mg/0.3 mL IJ SOAJ injection Inject 0.3 mg into the muscle as needed for anaphylaxis. 1 each 1   fexofenadine (ALLEGRA) 180 MG tablet Take 180 mg by  mouth daily as needed for allergies.     losartan (COZAAR) 50 MG tablet TAKE ONE AND ONE-HALF TABLETS BY MOUTH ONE TIME DAILY 135 tablet 3   meloxicam (MOBIC) 7.5 MG tablet Take 7.5 mg by mouth daily.     omeprazole (PRILOSEC) 40 MG capsule TAKE ONE CAPSULE BY MOUTH ONE TIME DAILY 90 capsule 3   ondansetron (ZOFRAN) 4 MG tablet TAKE ONE TABLET BY MOUTH EVERY 8 HOURS AS NEEDED FOR NAUSEA AND VOMITING 20 tablet 0   triamcinolone cream (KENALOG) 0.1 % SMARTSIG:1 sparingly Topical Twice Daily     valACYclovir (VALTREX) 500 MG tablet Take one tablet (500 mg) by mouth  twice a day for 3 days as needed for a genital outbreak.  Take 4 tablets (2000 mg) by mouth twice a day for 1 day as needed for an oral outbreak. 30 tablet 5   brimonidine (ALPHAGAN) 0.2 % ophthalmic solution SMARTSIG:In Eye(s)     No facility-administered medications prior to visit.     Per HPI unless specifically indicated in ROS section below Review of Systems  Objective:  BP 132/64   Pulse 61   Temp (!) 97.5 F (36.4 C) (Temporal)   Ht '5\' 3"'$  (1.6 m)   Wt 154 lb 3.2 oz (69.9 kg)   LMP 10/20/1979 (Within Years)   SpO2 96%   BMI 27.32 kg/m   Wt Readings from Last 3 Encounters:  09/23/22 154 lb 3.2 oz (69.9 kg)  06/04/22 148 lb (67.1 kg)  05/27/22 149 lb (67.6 kg)      Physical Exam Vitals and nursing note reviewed.  Constitutional:      Appearance: Normal appearance. She is not ill-appearing.  HENT:     Head: Atraumatic.     Mouth/Throat:     Mouth: Mucous membranes are moist.     Pharynx: Oropharynx is clear. No oropharyngeal exudate or posterior oropharyngeal erythema.  Eyes:     Extraocular Movements: Extraocular movements intact.     Conjunctiva/sclera: Conjunctivae normal.     Pupils: Pupils are equal, round, and reactive to light.  Cardiovascular:     Rate and Rhythm: Normal rate and regular rhythm.     Pulses: Normal pulses.     Heart sounds: Normal heart sounds. No murmur heard. Pulmonary:     Effort: Pulmonary effort is normal. No respiratory distress.     Breath sounds: Normal breath sounds. No wheezing, rhonchi or rales.  Abdominal:     General: Abdomen is flat. Bowel sounds are normal. There is no distension.     Palpations: Abdomen is soft. There is no mass.     Tenderness: There is no abdominal tenderness. There is no right CVA tenderness, left CVA tenderness, guarding or rebound.     Hernia: No hernia is present.  Musculoskeletal:     Right lower leg: No edema.     Left lower leg: No edema.     Comments:  2+ DP bilaterally No palpable cords or  posterior leg discomfort to palpation FROM at bilateral knees in flexion/extension R calf circ 34 cm L calf circ 33.5 cm  Skin:    General: Skin is warm and dry.     Findings: Rash present.     Comments: New petechial rash to left forearm  Neurological:     Mental Status: She is alert.  Psychiatric:        Mood and Affect: Mood normal.        Behavior: Behavior normal.  Results for orders placed or performed in visit on 09/23/22  POCT Urinalysis Dipstick (Automated)  Result Value Ref Range   Color, UA yellow    Clarity, UA cloudy    Glucose, UA Negative Negative   Bilirubin, UA 1+    Ketones, UA negative    Spec Grav, UA 1.020 1.010 - 1.025   Blood, UA 2+    pH, UA 6.0 5.0 - 8.0   Protein, UA Positive (A) Negative   Urobilinogen, UA 0.2 0.2 or 1.0 E.U./dL   Nitrite, UA negative    Leukocytes, UA Large (3+) (A) Negative   Lab Results  Component Value Date   CHOL 134 05/15/2022   HDL 48.20 05/15/2022   LDLCALC 58 05/15/2022   LDLDIRECT 103.0 04/20/2018   TRIG 139.0 05/15/2022   CHOLHDL 3 05/15/2022    Lab Results  Component Value Date   WBC 7.1 05/15/2022   HGB 11.5 (L) 05/15/2022   HCT 34.1 (L) 05/15/2022   MCV 86.1 05/15/2022   PLT 129.0 (L) 05/15/2022    Lab Results  Component Value Date   CREATININE 0.86 05/15/2022   BUN 20 05/15/2022   NA 140 05/15/2022   K 4.0 05/15/2022   CL 106 05/15/2022   CO2 27 05/15/2022    Lab Results  Component Value Date   ALT 12 05/15/2022   AST 15 05/15/2022   ALKPHOS 69 05/15/2022   BILITOT 0.5 05/15/2022   Assessment & Plan:   Problem List Items Addressed This Visit   None Visit Diagnoses     Left upper quadrant abdominal pain    -  Primary   Relevant Orders   POCT Urinalysis Dipstick (Automated) (Completed)        No orders of the defined types were placed in this encounter.  Orders Placed This Encounter  Procedures   POCT Urinalysis Dipstick (Automated)     Patient Instructions  Urine  culture sent. We will be in touch with results.  D dimer test -if elevated, we will need to check leg ultrasound.  For upper abdominal pain, continue dicyclomine as needed.   Follow up plan: No follow-ups on file.  Ria Bush, MD

## 2022-09-23 NOTE — Patient Instructions (Addendum)
Urine culture sent. We will be in touch with results.  D dimer test -if elevated, we will need to check leg ultrasound.  For upper abdominal pain, continue dicyclomine as needed.

## 2022-09-24 ENCOUNTER — Encounter: Payer: Self-pay | Admitting: Family Medicine

## 2022-09-24 ENCOUNTER — Other Ambulatory Visit: Payer: Self-pay | Admitting: Family Medicine

## 2022-09-24 DIAGNOSIS — M7989 Other specified soft tissue disorders: Secondary | ICD-10-CM | POA: Insufficient documentation

## 2022-09-24 DIAGNOSIS — R233 Spontaneous ecchymoses: Secondary | ICD-10-CM | POA: Insufficient documentation

## 2022-09-24 DIAGNOSIS — M79662 Pain in left lower leg: Secondary | ICD-10-CM | POA: Insufficient documentation

## 2022-09-24 LAB — COMPREHENSIVE METABOLIC PANEL
ALT: 15 U/L (ref 0–35)
AST: 15 U/L (ref 0–37)
Albumin: 4 g/dL (ref 3.5–5.2)
Alkaline Phosphatase: 80 U/L (ref 39–117)
BUN: 16 mg/dL (ref 6–23)
CO2: 30 mEq/L (ref 19–32)
Calcium: 9.7 mg/dL (ref 8.4–10.5)
Chloride: 102 mEq/L (ref 96–112)
Creatinine, Ser: 1.02 mg/dL (ref 0.40–1.20)
GFR: 52.97 mL/min — ABNORMAL LOW (ref >60.00)
Glucose, Bld: 100 mg/dL — ABNORMAL HIGH (ref 70–99)
Potassium: 4.2 mEq/L (ref 3.5–5.1)
Sodium: 141 mEq/L (ref 135–145)
Total Bilirubin: 0.4 mg/dL (ref 0.2–1.2)
Total Protein: 6.5 g/dL (ref 6.0–8.3)

## 2022-09-24 LAB — CBC WITH DIFFERENTIAL/PLATELET
Absolute Monocytes: 932 cells/uL (ref 200–950)
Basophils Absolute: 35 cells/uL (ref 0–200)
Basophils Relative: 0.3 %
Eosinophils Absolute: 69 cells/uL (ref 15–500)
Eosinophils Relative: 0.6 %
HCT: 36.2 % (ref 35.0–45.0)
Hemoglobin: 11.9 g/dL (ref 11.7–15.5)
Lymphs Abs: 2818 cells/uL (ref 850–3900)
MCH: 29.1 pg (ref 27.0–33.0)
MCHC: 32.9 g/dL (ref 32.0–36.0)
MCV: 88.5 fL (ref 80.0–100.0)
MPV: 12.2 fL (ref 7.5–12.5)
Monocytes Relative: 8.1 %
Neutro Abs: 7648 cells/uL (ref 1500–7800)
Neutrophils Relative %: 66.5 %
Platelets: 169 10*3/uL (ref 140–400)
RBC: 4.09 10*6/uL (ref 3.80–5.10)
RDW: 14 % (ref 11.0–15.0)
Total Lymphocyte: 24.5 %
WBC: 11.5 10*3/uL — ABNORMAL HIGH (ref 3.8–10.8)

## 2022-09-24 LAB — PATHOLOGIST SMEAR REVIEW

## 2022-09-24 LAB — URINE CULTURE
MICRO NUMBER:: 14278521
SPECIMEN QUALITY:: ADEQUATE

## 2022-09-24 LAB — LIPASE: Lipase: 17 U/L (ref 11.0–59.0)

## 2022-09-24 MED ORDER — CIPROFLOXACIN HCL 250 MG PO TABS: 250.0000 mg | ORAL_TABLET | Freq: Two times a day (BID) | ORAL | 0 refills | Status: DC

## 2022-09-24 NOTE — Assessment & Plan Note (Addendum)
Several weeks of intermittent abdominal pain associated with painful urination, itching urgency and frequency.  She currently takes keflex '250mg'$  daily for UTI ppx.  UA today suspicious for infection, UCx sent.  She regularly sees urology Amalia Hailey) for recurrent UTI.  Probiotics previously tried were not helpful.

## 2022-09-24 NOTE — Assessment & Plan Note (Signed)
Newly noted in h/o thrombocytopenia - will update CBC with peripheral smear.

## 2022-09-24 NOTE — Assessment & Plan Note (Addendum)
Chronic issue. Latest GI evaluation showed ischemic colitis by biopsy during colonoscopy 03/2021.  She already is on low dose atorvastatin, with good blood pressure control.  ?MSK contribution.  Continue dicyclomine PRN

## 2022-09-24 NOTE — Assessment & Plan Note (Addendum)
Benign exam. Check D dimer. If elevated, check leg ultrasound r/o DVT.

## 2022-09-30 ENCOUNTER — Ambulatory Visit
Admission: RE | Admit: 2022-09-30 | Discharge: 2022-09-30 | Disposition: A | Discharge status: Home / Self Care | Payer: PPO | Source: Ambulatory Visit | Attending: Family Medicine | Admitting: Family Medicine

## 2022-09-30 DIAGNOSIS — Z1231 Encounter for screening mammogram for malignant neoplasm of breast: Secondary | ICD-10-CM

## 2022-10-06 ENCOUNTER — Emergency Department: Payer: PPO

## 2022-10-06 ENCOUNTER — Other Ambulatory Visit: Payer: Self-pay

## 2022-10-06 ENCOUNTER — Observation Stay
Admission: EM | Admit: 2022-10-06 | Discharge: 2022-10-07 | Disposition: A | Discharge status: Home / Self Care | Payer: PPO | Attending: Hospitalist | Admitting: Hospitalist

## 2022-10-06 ENCOUNTER — Encounter: Payer: Self-pay | Admitting: Emergency Medicine

## 2022-10-06 DIAGNOSIS — I2510 Atherosclerotic heart disease of native coronary artery without angina pectoris: Secondary | ICD-10-CM | POA: Diagnosis present

## 2022-10-06 DIAGNOSIS — I10 Essential (primary) hypertension: Secondary | ICD-10-CM | POA: Diagnosis present

## 2022-10-06 DIAGNOSIS — N390 Urinary tract infection, site not specified: Secondary | ICD-10-CM | POA: Diagnosis present

## 2022-10-06 DIAGNOSIS — R109 Unspecified abdominal pain: Secondary | ICD-10-CM | POA: Insufficient documentation

## 2022-10-06 DIAGNOSIS — K625 Hemorrhage of anus and rectum: Secondary | ICD-10-CM | POA: Diagnosis not present

## 2022-10-06 DIAGNOSIS — Z79899 Other long term (current) drug therapy: Secondary | ICD-10-CM | POA: Diagnosis not present

## 2022-10-06 DIAGNOSIS — R197 Diarrhea, unspecified: Secondary | ICD-10-CM | POA: Insufficient documentation

## 2022-10-06 DIAGNOSIS — B9620 Unspecified Escherichia coli [E. coli] as the cause of diseases classified elsewhere: Secondary | ICD-10-CM | POA: Diagnosis not present

## 2022-10-06 DIAGNOSIS — Z1629 Resistance to other single specified antibiotic: Secondary | ICD-10-CM | POA: Insufficient documentation

## 2022-10-06 DIAGNOSIS — K219 Gastro-esophageal reflux disease without esophagitis: Secondary | ICD-10-CM | POA: Diagnosis present

## 2022-10-06 DIAGNOSIS — K5730 Diverticulosis of large intestine without perforation or abscess without bleeding: Secondary | ICD-10-CM | POA: Diagnosis not present

## 2022-10-06 DIAGNOSIS — K529 Noninfective gastroenteritis and colitis, unspecified: Secondary | ICD-10-CM | POA: Diagnosis present

## 2022-10-06 DIAGNOSIS — Z8616 Personal history of COVID-19: Secondary | ICD-10-CM | POA: Insufficient documentation

## 2022-10-06 DIAGNOSIS — F419 Anxiety disorder, unspecified: Secondary | ICD-10-CM | POA: Diagnosis not present

## 2022-10-06 DIAGNOSIS — K922 Gastrointestinal hemorrhage, unspecified: Secondary | ICD-10-CM | POA: Diagnosis not present

## 2022-10-06 DIAGNOSIS — R7881 Bacteremia: Secondary | ICD-10-CM | POA: Diagnosis not present

## 2022-10-06 DIAGNOSIS — Z7982 Long term (current) use of aspirin: Secondary | ICD-10-CM | POA: Diagnosis not present

## 2022-10-06 DIAGNOSIS — Z1611 Resistance to penicillins: Secondary | ICD-10-CM | POA: Diagnosis not present

## 2022-10-06 DIAGNOSIS — K55039 Acute (reversible) ischemia of large intestine, extent unspecified: Principal | ICD-10-CM | POA: Insufficient documentation

## 2022-10-06 DIAGNOSIS — E785 Hyperlipidemia, unspecified: Secondary | ICD-10-CM | POA: Diagnosis present

## 2022-10-06 DIAGNOSIS — K6389 Other specified diseases of intestine: Secondary | ICD-10-CM | POA: Diagnosis not present

## 2022-10-06 DIAGNOSIS — K3189 Other diseases of stomach and duodenum: Secondary | ICD-10-CM | POA: Diagnosis not present

## 2022-10-06 LAB — CBC WITH DIFFERENTIAL/PLATELET
Abs Immature Granulocytes: 0.09 10*3/uL — ABNORMAL HIGH (ref 0.00–0.07)
Basophils Absolute: 0 10*3/uL (ref 0.0–0.1)
Basophils Relative: 0 %
Eosinophils Absolute: 0.2 10*3/uL (ref 0.0–0.5)
Eosinophils Relative: 1 %
HCT: 39.2 % (ref 36.0–46.0)
Hemoglobin: 12.4 g/dL (ref 12.0–15.0)
Immature Granulocytes: 1 %
Lymphocytes Relative: 18 %
Lymphs Abs: 2.7 10*3/uL (ref 0.7–4.0)
MCH: 28.8 pg (ref 26.0–34.0)
MCHC: 31.6 g/dL (ref 30.0–36.0)
MCV: 91.2 fL (ref 80.0–100.0)
Monocytes Absolute: 1.3 10*3/uL — ABNORMAL HIGH (ref 0.1–1.0)
Monocytes Relative: 9 %
Neutro Abs: 10.5 10*3/uL — ABNORMAL HIGH (ref 1.7–7.7)
Neutrophils Relative %: 71 %
Platelets: 187 10*3/uL (ref 150–400)
RBC: 4.3 MIL/uL (ref 3.87–5.11)
RDW: 14.6 % (ref 11.5–15.5)
WBC: 14.8 10*3/uL — ABNORMAL HIGH (ref 4.0–10.5)
nRBC: 0 % (ref 0.0–0.2)

## 2022-10-06 LAB — COMPREHENSIVE METABOLIC PANEL
ALT: 15 U/L (ref 0–44)
AST: 21 U/L (ref 15–41)
Albumin: 3.7 g/dL (ref 3.5–5.0)
Alkaline Phosphatase: 75 U/L (ref 38–126)
Anion gap: 8 (ref 5–15)
BUN: 20 mg/dL (ref 8–23)
CO2: 22 mmol/L (ref 22–32)
Calcium: 9.3 mg/dL (ref 8.9–10.3)
Chloride: 109 mmol/L (ref 98–111)
Creatinine, Ser: 0.96 mg/dL (ref 0.44–1.00)
GFR, Estimated: >60 mL/min (ref >60)
Glucose, Bld: 127 mg/dL — ABNORMAL HIGH (ref 70–99)
Potassium: 3.8 mmol/L (ref 3.5–5.1)
Sodium: 139 mmol/L (ref 135–145)
Total Bilirubin: 0.6 mg/dL (ref 0.3–1.2)
Total Protein: 6.7 g/dL (ref 6.5–8.1)

## 2022-10-06 LAB — URINALYSIS, ROUTINE W REFLEX MICROSCOPIC
Bilirubin Urine: NEGATIVE
Glucose, UA: NEGATIVE mg/dL
Ketones, ur: NEGATIVE mg/dL
Nitrite: NEGATIVE
Protein, ur: NEGATIVE mg/dL
Specific Gravity, Urine: 1.018 (ref 1.005–1.030)
WBC, UA: >50 WBC/hpf — ABNORMAL HIGH (ref 0–5)
pH: 5 (ref 5.0–8.0)

## 2022-10-06 LAB — CBC
HCT: 35.4 % — ABNORMAL LOW (ref 36.0–46.0)
HCT: 36.4 % (ref 36.0–46.0)
Hemoglobin: 11.2 g/dL — ABNORMAL LOW (ref 12.0–15.0)
Hemoglobin: 11.4 g/dL — ABNORMAL LOW (ref 12.0–15.0)
MCH: 28.8 pg (ref 26.0–34.0)
MCH: 28.1 pg (ref 26.0–34.0)
MCHC: 31.6 g/dL (ref 30.0–36.0)
MCHC: 31.3 g/dL (ref 30.0–36.0)
MCV: 91 fL (ref 80.0–100.0)
MCV: 89.9 fL (ref 80.0–100.0)
Platelets: 158 10*3/uL (ref 150–400)
Platelets: 163 10*3/uL (ref 150–400)
RBC: 3.89 MIL/uL (ref 3.87–5.11)
RBC: 4.05 MIL/uL (ref 3.87–5.11)
RDW: 14.6 % (ref 11.5–15.5)
RDW: 14.6 % (ref 11.5–15.5)
WBC: 10.7 10*3/uL — ABNORMAL HIGH (ref 4.0–10.5)
WBC: 10.1 10*3/uL (ref 4.0–10.5)
nRBC: 0 % (ref 0.0–0.2)
nRBC: 0 % (ref 0.0–0.2)

## 2022-10-06 LAB — APTT: aPTT: 28 seconds (ref 24–36)

## 2022-10-06 LAB — TYPE AND SCREEN
ABO/RH(D): O POS
Antibody Screen: NEGATIVE

## 2022-10-06 LAB — PROTIME-INR
INR: 1.1 (ref 0.8–1.2)
Prothrombin Time: 14.1 seconds (ref 11.4–15.2)

## 2022-10-06 LAB — LIPASE, BLOOD: Lipase: 32 U/L (ref 11–51)

## 2022-10-06 MED ORDER — ATORVASTATIN CALCIUM 10 MG PO TABS
10.0000 mg | ORAL_TABLET | Freq: Every day | ORAL | Status: DC
  Administered 2022-10-06 – 2022-10-07 (×2): 10 mg via ORAL
  Filled 2022-10-06 (×2): qty 1

## 2022-10-06 MED ORDER — ONDANSETRON HCL 4 MG/2ML IJ SOLN: 4.0000 mg | Freq: Three times a day (TID) | INTRAMUSCULAR | Status: DC | PRN

## 2022-10-06 MED ORDER — ALPRAZOLAM 0.5 MG PO TABS
0.5000 mg | ORAL_TABLET | Freq: Two times a day (BID) | ORAL | Status: DC | PRN
  Administered 2022-10-07: 0.5 mg via ORAL
  Filled 2022-10-06: qty 1

## 2022-10-06 MED ORDER — SODIUM CHLORIDE 0.9 % IV SOLN
1.0000 g | Freq: Two times a day (BID) | INTRAVENOUS | Status: DC
  Administered 2022-10-06 (×2): 1 g via INTRAVENOUS
  Filled 2022-10-06 (×3): qty 20

## 2022-10-06 MED ORDER — ORAL CARE MOUTH RINSE: 15.0000 mL | OROMUCOSAL | Status: DC | PRN

## 2022-10-06 MED ORDER — DICYCLOMINE HCL 10 MG PO CAPS: 10.0000 mg | ORAL_CAPSULE | Freq: Two times a day (BID) | ORAL | Status: DC | PRN

## 2022-10-06 MED ORDER — METRONIDAZOLE 500 MG/100ML IV SOLN
500.0000 mg | Freq: Once | INTRAVENOUS | Status: DC
  Filled 2022-10-06: qty 100

## 2022-10-06 MED ORDER — PANTOPRAZOLE SODIUM 40 MG PO TBEC
40.0000 mg | DELAYED_RELEASE_TABLET | Freq: Every day | ORAL | Status: DC
  Administered 2022-10-06 – 2022-10-07 (×2): 40 mg via ORAL
  Filled 2022-10-06 (×3): qty 1

## 2022-10-06 MED ORDER — ACETAMINOPHEN 325 MG PO TABS
650.0000 mg | ORAL_TABLET | Freq: Four times a day (QID) | ORAL | Status: DC | PRN
  Administered 2022-10-06 – 2022-10-07 (×2): 650 mg via ORAL
  Filled 2022-10-06 (×2): qty 2

## 2022-10-06 MED ORDER — IOHEXOL 350 MG/ML SOLN
100.0000 mL | Freq: Once | INTRAVENOUS | Status: AC | PRN
  Administered 2022-10-06: 100 mL via INTRAVENOUS

## 2022-10-06 MED ORDER — FENTANYL CITRATE PF 50 MCG/ML IJ SOSY: 12.5000 ug | PREFILLED_SYRINGE | INTRAMUSCULAR | Status: DC | PRN

## 2022-10-06 MED ORDER — ATENOLOL 25 MG PO TABS
25.0000 mg | ORAL_TABLET | Freq: Two times a day (BID) | ORAL | Status: DC
  Administered 2022-10-06 (×2): 25 mg via ORAL
  Filled 2022-10-06 (×3): qty 1

## 2022-10-06 MED ORDER — LORATADINE 10 MG PO TABS: 10.0000 mg | ORAL_TABLET | Freq: Every day | ORAL | Status: DC | PRN

## 2022-10-06 MED ORDER — OYSTER SHELL CALCIUM/D3 500-5 MG-MCG PO TABS
1.0000 | ORAL_TABLET | Freq: Every day | ORAL | Status: DC
  Administered 2022-10-06 – 2022-10-07 (×2): 1 via ORAL
  Filled 2022-10-06 (×2): qty 1

## 2022-10-06 MED ORDER — HYDRALAZINE HCL 20 MG/ML IJ SOLN: 5.0000 mg | INTRAMUSCULAR | Status: DC | PRN

## 2022-10-06 MED ORDER — VITAMIN D 25 MCG (1000 UNIT) PO TABS
1000.0000 [IU] | ORAL_TABLET | Freq: Every day | ORAL | Status: DC
  Administered 2022-10-06 – 2022-10-07 (×2): 1000 [IU] via ORAL
  Filled 2022-10-06 (×2): qty 1

## 2022-10-06 MED ORDER — CIPROFLOXACIN IN D5W 400 MG/200ML IV SOLN
400.0000 mg | Freq: Once | INTRAVENOUS | Status: DC
  Filled 2022-10-06: qty 200

## 2022-10-06 MED ORDER — VITAMIN B-12 1000 MCG PO TABS
1000.0000 ug | ORAL_TABLET | Freq: Every day | ORAL | Status: DC
  Administered 2022-10-06 – 2022-10-07 (×2): 1000 ug via ORAL
  Filled 2022-10-06 (×2): qty 1

## 2022-10-06 MED ORDER — SODIUM CHLORIDE 0.9 % IV BOLUS
1000.0000 mL | Freq: Once | INTRAVENOUS | Status: AC
  Administered 2022-10-06: 1000 mL via INTRAVENOUS
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
## 2022-10-06 NOTE — Progress Notes (Signed)
Pharmacy Antibiotic Note  Bianca Fry is a 76 y.o. female presented with abdominal pain, cramping, diarrhea, and rectal bleeding, admitted on 10/06/2022 with  intraabdominal infection .  Multiple allergies/adverse drug reactions including metronidazole (diarrhea, nausea), penicillins (rash). Recently filled ciprofloxacin on 09/28/22. Pharmacy has been consulted for meropenem dosing.    Plan: Meropenem IV 1 gram every 8 hours  Follow renal function and culture results.  Height: '5\' 4"'$  (162.6 cm) Weight: 65.8 kg (145 lb) IBW/kg (Calculated) : 54.7  Temp (24hrs), Avg:97.4 F (36.3 C), Min:97.4 F (36.3 C), Max:97.4 F (36.3 C)  Recent Labs  Lab 10/06/22 0352  WBC 14.8*  CREATININE 0.96    Estimated Creatinine Clearance: 45.8 mL/min (by C-G formula based on SCr of 0.96 mg/dL).    Allergies  Allergen Reactions   Codeine Other (See Comments)    Chest pain   Nitrofurantoin Other (See Comments)    Chest pain - hospitalization   Percocet [Oxycodone-Acetaminophen] Nausea And Vomiting   Propoxyphene Nausea And Vomiting   Vicodin [Hydrocodone-Acetaminophen] Nausea And Vomiting   Amlodipine Other (See Comments)    Pedal edema   Boniva [Ibandronic Acid] Diarrhea    Severe watery diarrhea - led to ER visit   Elmiron [Pentosan Polysulfate]     Chest pain, tremors, wheezing    Flagyl [Metronidazole] Diarrhea and Nausea Only   Fosamax [Alendronate] Other (See Comments)    R hip pain   Hctz [Hydrochlorothiazide] Other (See Comments)    headache   Vancomycin Diarrhea and Other (See Comments)    Severe abdominal pain   Clarithromycin Other (See Comments)    Abd. cramps   Penicillins Rash   Prednisone Other (See Comments)    Caused UTI   Sulfonamide Derivatives Other (See Comments)    GI upset    Antimicrobials this admission: Meropenem 12/19 >>   Dose adjustments this admission: N/a  Microbiology results: 12/19 BCx: to be collected 12/19 UCx: in process  12/19 GI panel:  to be collected  Thank you for allowing pharmacy to be a part of this patient's care.   Glean Salvo, PharmD, BCPS Clinical Pharmacist  10/06/2022 8:11 AM

## 2022-10-06 NOTE — Plan of Care (Signed)

## 2022-10-06 NOTE — ED Notes (Signed)
Stool has not been collected yet due to the patient not needing to have a bowel movement yet

## 2022-10-06 NOTE — ED Triage Notes (Signed)
Patient ambulatory to triage with steady gait, without difficulty or distress noted; pt reports abd cramping that began on Sunday; rectal bleeding tonight; st hx of same with colitis

## 2022-10-06 NOTE — ED Provider Notes (Signed)
[facility] Provider Note    Event Date/Time   First MD Initiated Contact with Patient 10/06/22 0507     (approximate)  History   Chief Complaint: Rectal Bleeding  HPI  Bianca Fry is a 76 y.o. female with a past medical history of anemia, anxiety, gastric reflux, ischemic colitis, presents to the emergency department for abdominal pain and rectal bleeding.  According to the patient since yesterday morning she has been experiencing loose stool along with abdominal pain and cramping mostly in the left lower quadrant.  Patient states this evening she developed bright red blood per rectum in the last 2 bowel movements have been mostly blood.  Patient denies any vomiting.  No fever.  Describes abdominal pain as cramping in the left lower quadrant.  Patient states a history of ischemic colitis in the past which is feels fairly similar.  Physical Exam   Triage Vital Signs: ED Triage Vitals  Enc Vitals Group     BP 10/06/22 0353 (!) 171/68     Pulse Rate 10/06/22 0353 82     Resp 10/06/22 0353 16     Temp 10/06/22 0353 (!) 97.4 F (36.3 C)     Temp Source 10/06/22 0353 Oral     SpO2 10/06/22 0353 97 %     Weight 10/06/22 0349 145 lb (65.8 kg)     Height 10/06/22 0349 '5\' 4"'$  (1.626 m)     Head Circumference --      Peak Flow --      Pain Score 10/06/22 0349 3     Pain Loc --      Pain Edu? --      Excl. in [place]? --     Most recent vital signs: Vitals:   10/06/22 0353  BP: (!) 171/68  Pulse: 82  Resp: 16  Temp: (!) 97.4 F (36.3 C)  SpO2: 97%    General: Awake, no distress.  CV:  Good peripheral perfusion.  Regular rate and rhythm  Resp:  Normal effort.  Equal breath sounds bilaterally.  Abd:  No distention.  Soft, moderate left lower quadrant tenderness.  No rebound or guarding.  Abdomen otherwise benign.  ED Results / Procedures / Treatments   RADIOLOGY  I have reviewed the CT images on my evaluation I do not see any obvious  obstruction. Radiology is read the CT scan as positive for acute colitis but negative for active bleed.   MEDICATIONS ORDERED IN ED: Medications  sodium chloride 0.9 % bolus 1,000 mL (1,000 mLs Intravenous New Bag/Given 10/06/22 0547)  iohexol (OMNIPAQUE) 350 MG/ML injection 100 mL (100 mLs Intravenous Contrast Given 10/06/22 0558)     IMPRESSION / MDM / ASSESSMENT AND PLAN / ED COURSE  I reviewed the triage vital signs and the nursing notes.  Patient's presentation is most consistent with acute presentation with potential threat to life or bodily function.  Patient presents emergency department for left lower quadrant abdominal pain, cramping and rectal bleeding.  Patient states a history of colitis in the past.  Patient's lab work shows leukocytosis with a white blood cell count of 14,800 reassuring H&H, chemistry shows no concerning findings, lipase is normal.  We will obtain the CT scan of the abdomen to further evaluate.  We will continue to closely monitor while awaiting results.  Differential would include infectious colitis, ischemic colitis, diverticulitis, hemorrhoidal bleeding internal versus external.  Patient CT scan is negative for active bleed but positive for acute colitis.  Given the patient's abdominal discomfort acute colitis now with rectal bleeding elevated white blood cell count we will start the patient on IV antibiotics admit to the hospital service for further workup and treatment.  Patient agreeable to plan of care.  FINAL CLINICAL IMPRESSION(S) / ED DIAGNOSES   Abdominal pain Rectal bleeding Acute colitis   Note:  This document was prepared using Dragon voice recognition software and may include unintentional dictation errors.   Harvest Dark, MD 10/06/22 [phone]
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
## 2022-10-06 NOTE — ED Notes (Signed)
Unable to administer medications currently due to them not being verified.

## 2022-10-07 DIAGNOSIS — K529 Noninfective gastroenteritis and colitis, unspecified: Secondary | ICD-10-CM | POA: Diagnosis not present

## 2022-10-07 LAB — CBC
HCT: 30.7 % — ABNORMAL LOW (ref 36.0–46.0)
HCT: 31.3 % — ABNORMAL LOW (ref 36.0–46.0)
HCT: 32.4 % — ABNORMAL LOW (ref 36.0–46.0)
Hemoglobin: 10 g/dL — ABNORMAL LOW (ref 12.0–15.0)
Hemoglobin: 10.3 g/dL — ABNORMAL LOW (ref 12.0–15.0)
Hemoglobin: 9.8 g/dL — ABNORMAL LOW (ref 12.0–15.0)
MCH: 28.6 pg (ref 26.0–34.0)
MCH: 28.8 pg (ref 26.0–34.0)
MCH: 29 pg (ref 26.0–34.0)
MCHC: 31.8 g/dL (ref 30.0–36.0)
MCHC: 31.9 g/dL (ref 30.0–36.0)
MCHC: 31.9 g/dL (ref 30.0–36.0)
MCV: 90 fL (ref 80.0–100.0)
MCV: 90.2 fL (ref 80.0–100.0)
MCV: 90.8 fL (ref 80.0–100.0)
Platelets: 136 10*3/uL — ABNORMAL LOW (ref 150–400)
Platelets: 144 10*3/uL — ABNORMAL LOW (ref 150–400)
Platelets: 146 10*3/uL — ABNORMAL LOW (ref 150–400)
RBC: 3.38 MIL/uL — ABNORMAL LOW (ref 3.87–5.11)
RBC: 3.47 MIL/uL — ABNORMAL LOW (ref 3.87–5.11)
RBC: 3.6 MIL/uL — ABNORMAL LOW (ref 3.87–5.11)
RDW: 14.6 % (ref 11.5–15.5)
RDW: 14.7 % (ref 11.5–15.5)
RDW: 14.7 % (ref 11.5–15.5)
WBC: 8.2 10*3/uL (ref 4.0–10.5)
WBC: 8.4 10*3/uL (ref 4.0–10.5)
WBC: 9.9 10*3/uL (ref 4.0–10.5)
nRBC: 0 % (ref 0.0–0.2)
nRBC: 0 % (ref 0.0–0.2)
nRBC: 0 % (ref 0.0–0.2)

## 2022-10-07 LAB — BASIC METABOLIC PANEL
Anion gap: 6 (ref 5–15)
BUN: 14 mg/dL (ref 8–23)
CO2: 24 mmol/L (ref 22–32)
Calcium: 8.5 mg/dL — ABNORMAL LOW (ref 8.9–10.3)
Chloride: 112 mmol/L — ABNORMAL HIGH (ref 98–111)
Creatinine, Ser: 0.77 mg/dL (ref 0.44–1.00)
GFR, Estimated: >60 mL/min (ref >60)
Glucose, Bld: 99 mg/dL (ref 70–99)
Potassium: 3.5 mmol/L (ref 3.5–5.1)
Sodium: 142 mmol/L (ref 135–145)

## 2022-10-07 LAB — GASTROINTESTINAL PANEL BY PCR, STOOL (REPLACES STOOL CULTURE)

## 2022-10-07 LAB — C DIFFICILE QUICK SCREEN W PCR REFLEX
C Diff antigen: NEGATIVE
C Diff interpretation: NOT DETECTED
C Diff toxin: NEGATIVE

## 2022-10-07 MED ORDER — ATENOLOL 25 MG PO TABS
12.5000 mg | ORAL_TABLET | Freq: Two times a day (BID) | ORAL | Status: DC
  Administered 2022-10-07: 12.5 mg via ORAL
  Filled 2022-10-07: qty 1

## 2022-10-07 MED ORDER — LOSARTAN POTASSIUM 50 MG PO TABS
75.0000 mg | ORAL_TABLET | Freq: Every day | ORAL | Status: DC
  Administered 2022-10-07: 75 mg via ORAL
  Filled 2022-10-07: qty 1

## 2022-10-07 MED ORDER — SODIUM CHLORIDE 0.9 % IV SOLN
1.0000 g | Freq: Three times a day (TID) | INTRAVENOUS | Status: DC
  Filled 2022-10-07: qty 20

## 2022-10-07 MED ORDER — ATENOLOL 25 MG PO TABS: 12.5000 mg | ORAL_TABLET | Freq: Two times a day (BID) | ORAL | Status: DC

## 2022-10-07 NOTE — Progress Notes (Signed)
DISCHARGE NOTE:  Pt given discharge instructions, and verbalized understating doing. Pt wheeled to car by staff, husband providing transportation.

## 2022-10-07 NOTE — Discharge Summary (Signed)
Physician Discharge Summary   Bianca Fry  female DOB: [birth year]  ZES:[id number]  PCP: Ria Bush, MD  Admit date: 10/06/2022 Discharge date: 10/07/2022  Admitted From: home Disposition:  home CODE STATUS: Full code  Discharge Instructions     Discharge instructions   Complete by: As directed    GI has seen you and believed your bloody diarrhea and abdominal pain are due to ischemic colitis, supportive care is recommended, no need for antibiotic.  As we discussed, your urinary track is likely colonized with bacteria.  Since you just finished a course of Cipro for UTI and currently has no urinary symptoms, we will not treat with another course of antibiotic.  Since your heart rate was low in the hospital, please take half of your atenolol for 3-5 days, until you feel back to normal, then resume your regular dose.   Dr. Enzo Bi [facility] Course:  For full details, please see H&P, progress notes, consult notes and ancillary notes.  Briefly,  Bianca Fry is a 76 y.o. female with medical history significant of ischemic colitis, hypertension, hyperlipidemia, GERD, anxiety, CAD, PVD, who presents with abdominal pain, bloody diarrhea.     Acute ischemic colitis --GI consulted and dx with ischemic colitis.  Supportive care recommended, no need for abx.  C diff and GI path both neg.  Abdominal pain and bloody diarrhea resolved prior to discharge.   Bacteruria  --pt has frequent abx treatment for UTI, which usually does present with dysuria.  Pt had just recently finished a course of Cipro for UTI, and currently didn't have dysuria or urinary symptoms.  Pt agreed no need for abx treatment at this time.   --Urine cx pos for E coli, pt is likely chronically colonized.   HLD (hyperlipidemia) -Lipitor   Essential hypertension -cont Cozaar --HR was low in the hospital, so pt was advised to take 1/2 of usual atenolol BID for 3-5 days, until pt feels back to  normal, then resume regular dose of 25 mg BID.   GERD -Protonix   CAD (coronary artery disease) --cont ASA and statin   Anxiety: -As needed Xanax   Unless noted above, medications under "STOP" list are ones pt was not taking PTA.  Discharge Diagnoses:  Principal Problem:   Acute colitis Active Problems:   Recurrent UTI   HLD (hyperlipidemia)   Essential hypertension   GERD   CAD (coronary artery disease)   Anxiety     Discharge Instructions:  Allergies as of 10/07/2022       Reactions   Codeine Other (See Comments)   Chest pain   Nitrofurantoin Other (See Comments)   Chest pain - hospitalization   Percocet [oxycodone-acetaminophen] Nausea And Vomiting   Propoxyphene Nausea And Vomiting   Vicodin [hydrocodone-acetaminophen] Nausea And Vomiting   Amlodipine Other (See Comments)   Pedal edema   Boniva [ibandronic Acid] Diarrhea   Severe watery diarrhea - led to ER visit   Elmiron [pentosan Polysulfate]    Chest pain, tremors, wheezing    Flagyl [metronidazole] Diarrhea, Nausea Only   Fosamax [alendronate] Other (See Comments)   R hip pain   Hctz [hydrochlorothiazide] Other (See Comments)   headache   Vancomycin Diarrhea, Other (See Comments)   Severe abdominal pain   Clarithromycin Other (See Comments)   Abd. cramps   Penicillins Rash   Prednisone Other (See Comments)   Caused UTI   Sulfonamide Derivatives Other (See Comments)  GI upset        Medication List     STOP taking these medications    cephALEXin 250 MG capsule Commonly known as: KEFLEX   ciprofloxacin 250 MG tablet Commonly known as: CIPRO       TAKE these medications    ALPRAZolam 0.5 MG tablet Commonly known as: XANAX TAKE ONE TABLET BY MOUTH TWICE A DAY AS NEEDED FOR ANXIETY   aspirin EC 81 MG tablet Take 1 tablet (81 mg total) by mouth daily. Swallow whole.   atenolol 25 MG tablet Commonly known as: TENORMIN TAKE ONE TABLET BY MOUTH TWICE A DAY   atorvastatin 10  MG tablet Commonly known as: LIPITOR TAKE ONE TABLET BY MOUTH ONE TIME DAILY   B-12 1000 MCG Subl Place 1 tablet under the tongue daily.   Calcium-Vitamin D 600-400 MG-UNIT Tabs Take 1 tablet by mouth daily.   cholecalciferol 25 MCG (1000 UNIT) tablet Commonly known as: VITAMIN D3 Take 1,000 Units by mouth daily.   dicyclomine 10 MG capsule Commonly known as: BENTYL Take 1 capsule (10 mg total) by mouth 2 (two) times daily as needed for spasms.   EPINEPHrine 0.3 mg/0.3 mL Soaj injection Commonly known as: EPI-PEN Inject 0.3 mg into the muscle as needed for anaphylaxis.   fexofenadine 180 MG tablet Commonly known as: ALLEGRA Take 180 mg by mouth daily as needed for allergies.   losartan 50 MG tablet Commonly known as: COZAAR TAKE ONE AND ONE-HALF TABLETS BY MOUTH ONE TIME DAILY   meloxicam 7.5 MG tablet Commonly known as: MOBIC Take 7.5 mg by mouth daily.   omeprazole 40 MG capsule Commonly known as: PRILOSEC TAKE ONE CAPSULE BY MOUTH ONE TIME DAILY   ondansetron 4 MG tablet Commonly known as: ZOFRAN TAKE ONE TABLET BY MOUTH EVERY 8 HOURS AS NEEDED FOR NAUSEA AND VOMITING   Premarin vaginal cream Generic drug: conjugated estrogens Use 1/2 g vaginally and place a pea size amount to urethra two or three times per week as needed to maintain symptom relief.   triamcinolone cream 0.1 % Commonly known as: KENALOG SMARTSIG:1 sparingly Topical Twice Daily   valACYclovir 500 MG tablet Commonly known as: VALTREX Take one tablet (500 mg) by mouth twice a day for 3 days as needed for a genital outbreak.  Take 4 tablets (2000 mg) by mouth twice a day for 1 day as needed for an oral outbreak.         Follow-up Information     Ria Bush, MD Follow up in 1 week(s).   Specialty: Family Medicine Contact information: [place] [address] [phone]                 Allergies  Allergen Reactions   Codeine Other (See Comments)     Chest pain   Nitrofurantoin Other (See Comments)    Chest pain - hospitalization   Percocet [Oxycodone-Acetaminophen] Nausea And Vomiting   Propoxyphene Nausea And Vomiting   Vicodin [Hydrocodone-Acetaminophen] Nausea And Vomiting   Amlodipine Other (See Comments)    Pedal edema   Boniva [Ibandronic Acid] Diarrhea    Severe watery diarrhea - led to ER visit   Elmiron [Pentosan Polysulfate]     Chest pain, tremors, wheezing    Flagyl [Metronidazole] Diarrhea and Nausea Only   Fosamax [Alendronate] Other (See Comments)    R hip pain   Hctz [Hydrochlorothiazide] Other (See Comments)    headache   Vancomycin Diarrhea and Other (See Comments)  Severe abdominal pain   Clarithromycin Other (See Comments)    Abd. cramps   Penicillins Rash   Prednisone Other (See Comments)    Caused UTI   Sulfonamide Derivatives Other (See Comments)    GI upset     The results of significant diagnostics from this hospitalization (including imaging, microbiology, ancillary and laboratory) are listed below for reference.   Consultations:   Procedures/Studies: CT ANGIO GI BLEED  Result Date: 10/06/2022 CLINICAL DATA:  76 year old female with blood per rectum. Abdominal pain. History of colitis. EXAM: CTA ABDOMEN AND PELVIS WITHOUT AND WITH CONTRAST TECHNIQUE: Multidetector CT imaging of the abdomen and pelvis was performed using the standard protocol during bolus administration of intravenous contrast. Multiplanar reconstructed images and MIPs were obtained and reviewed to evaluate the vascular anatomy. RADIATION DOSE REDUCTION: This exam was performed according to the departmental dose-optimization program which includes automated exposure control, adjustment of the mA and/or kV according to patient size and/or use of iterative reconstruction technique. CONTRAST:  13m OMNIPAQUE IOHEXOL 350 MG/ML SOLN COMPARISON:  CTA abdomen and Pelvis 03/18/2021. FINDINGS: VASCULAR Aortoiliac calcified  atherosclerosis. Normal caliber abdominal aorta. Major arterial structures in the abdomen and pelvis are patent. Proximal femoral arteries are patent with calcified atherosclerosis. Portal venous system is patent on the delayed images. Comparing pre- and postcontrast images, no gastrointestinal contrast extravasation identified. Review of the MIP images confirms the above findings. NON-VASCULAR Lower chest: Stable borderline cardiomegaly. No pericardial or pleural effusion. Stable mild lung base scarring. Hepatobiliary: Chronically absent gallbladder and mild pneumobilia. Stable liver enhancement. Pancreas: Negative. Spleen: Negative. Adrenals/Urinary Tract: Chronic renal cortical volume loss bilaterally, otherwise negative. Stomach/Bowel: Circumferential large bowel wall thickening, bowel wall edema from the splenic flexure through the sigmoid colon with underlying extensive chronic sigmoid diverticula. Indistinct appearance of the sigmoid colon similar to that in 2022. And trace distal sigmoid mesentery free fluid or inflammation series 16, image 192. No extraluminal gas. Transverse and right colon have a more normal appearance. Diminutive or absent appendix. Negative terminal ileum. No dilated small bowel. Decompressed stomach. Small gastric hiatal hernia versus phrenic ampulla is stable. No other free fluid in the abdomen. Lymphatic: No lymphadenopathy identified. Reproductive: Chronically absent uterus, diminutive or absent ovaries. Other: No pelvic free fluid. Musculoskeletal: Chronic L4-L5 fusion. Osteopenia. No acute osseous abnormality identified. IMPRESSION: 1. Positive for evidence of Acute Colitis from the splenic flexure through the sigmoid colon. Underlying extensive sigmoid diverticulosis. But no active gastrointestinal bleeding identified by CTA. No perforation or abscess. 2. No other acute or inflammatory process identified in the abdomen or pelvis. 3.  Aortic Atherosclerosis (ICD10-I70.0).  Electronically Signed   By: HGenevie AnnM.D.   On: 10/06/2022 06:26   MM 3D SCREEN BREAST BILATERAL  Result Date: 10/02/2022 CLINICAL DATA:  Screening. EXAM: DIGITAL SCREENING BILATERAL MAMMOGRAM WITH TOMOSYNTHESIS AND CAD TECHNIQUE: Bilateral screening digital craniocaudal and mediolateral oblique mammograms were obtained. Bilateral screening digital breast tomosynthesis was performed. The images were evaluated with computer-aided detection. COMPARISON:  Previous exam(s). ACR Breast Density Category b: There are scattered areas of fibroglandular density. FINDINGS: There are no findings suspicious for malignancy. IMPRESSION: No mammographic evidence of malignancy. A result letter of this screening mammogram will be mailed directly to the patient. RECOMMENDATION: Screening mammogram in one year. (Code:[id number]-B-01Y) BI-RADS CATEGORY  1: Negative. Electronically Signed   By: SFranki CabotM.D.   On: 10/02/2022 10:18      Labs: BNP (last 3 results) No results for input(s): "BNP" in the last 8760 hours.  Basic Metabolic Panel: Recent Labs  Lab 10/06/22 0352 10/07/22 0315  NA 139 142  K 3.8 3.5  CL 109 112*  CO2 22 24  GLUCOSE 127* 99  BUN 20 14  CREATININE 0.96 0.77  CALCIUM 9.3 8.5*   Liver Function Tests: Recent Labs  Lab 10/06/22 0352  AST 21  ALT 15  ALKPHOS 75  BILITOT 0.6  PROT 6.7  ALBUMIN 3.7   Recent Labs  Lab 10/06/22 0352  LIPASE 32   No results for input(s): "AMMONIA" in the last 168 hours. CBC: Recent Labs  Lab 10/06/22 0352 10/06/22 1631 10/06/22 1843 10/07/22 0004 10/07/22 0315 10/07/22 0854  WBC 14.8* 10.7* 10.1 8.4 8.2 9.9  NEUTROABS 10.5*  --   --   --   --   --   HGB 12.4 11.2* 11.4* 10.0* 9.8* 10.3*  HCT 39.2 35.4* 36.4 31.3* 30.7* 32.4*  MCV 91.2 91.0 89.9 90.2 90.8 90.0  PLT 187 158 163 146* 144* 136*   Cardiac Enzymes: No results for input(s): "CKTOTAL", "CKMB", "CKMBINDEX", "TROPONINI" in the last 168 hours. BNP: Invalid input(s):  "POCBNP" CBG: No results for input(s): "GLUCAP" in the last 168 hours. D-Dimer No results for input(s): "DDIMER" in the last 72 hours. Hgb A1c No results for input(s): "HGBA1C" in the last 72 hours. Lipid Profile No results for input(s): "CHOL", "HDL", "LDLCALC", "TRIG", "CHOLHDL", "LDLDIRECT" in the last 72 hours. Thyroid function studies No results for input(s): "TSH", "T4TOTAL", "T3FREE", "THYROIDAB" in the last 72 hours.  Invalid input(s): "FREET3" Anemia work up No results for input(s): "VITAMINB12", "FOLATE", "FERRITIN", "TIBC", "IRON", "RETICCTPCT" in the last 72 hours. Urinalysis    Component Value Date/Time   COLORURINE YELLOW (A) 10/06/2022 0352   APPEARANCEUR HAZY (A) 10/06/2022 0352   APPEARANCEUR Hazy 11/19/2011 1853   LABSPEC 1.018 10/06/2022 0352   LABSPEC 1.011 11/19/2011 1853   PHURINE 5.0 10/06/2022 0352   GLUCOSEU NEGATIVE 10/06/2022 0352   GLUCOSEU Negative 11/19/2011 1853   HGBUR SMALL (A) 10/06/2022 0352   HGBUR 1+ 01/28/2010 0931   BILIRUBINUR NEGATIVE 10/06/2022 0352   BILIRUBINUR 1+ 09/23/2022 1419   BILIRUBINUR Negative 11/19/2011 1853   KETONESUR NEGATIVE 10/06/2022 0352   PROTEINUR NEGATIVE 10/06/2022 0352   UROBILINOGEN 0.2 09/23/2022 1419   UROBILINOGEN 0.2 01/12/2015 2138   NITRITE NEGATIVE 10/06/2022 0352   LEUKOCYTESUR MODERATE (A) 10/06/2022 0352   LEUKOCYTESUR 1+ 11/19/2011 1853   Sepsis Labs Recent Labs  Lab 10/06/22 1843 10/07/22 0004 10/07/22 0315 10/07/22 0854  WBC 10.1 8.4 8.2 9.9   Microbiology Recent Results (from the past 240 hour(s))  Urine Culture     Status: Abnormal (Preliminary result)   Collection Time: 10/06/22  3:52 AM   Specimen: Urine, Clean Catch  Result Value Ref Range Status   Specimen Description   Final    URINE, CLEAN CATCH Performed at [facility], [address], [place], [place] [address]    Special Requests   Final    NONE Performed at [facility], [address],  [place], [place] [address]    Culture (A)  Final    >=100,000 COLONIES/mL Lonell Grandchild NEGATIVE RODS SUSCEPTIBILITIES TO FOLLOW Performed at [facility], [place] [address], [place], [place] [address]    Report Status PENDING  Incomplete  C Difficile Quick Screen w PCR reflex     Status: None   Collection Time: 10/06/22  8:50 AM   Specimen: STOOL  Result Value Ref Range Status   C Diff  antigen NEGATIVE NEGATIVE Final   C Diff toxin NEGATIVE NEGATIVE Final   C Diff interpretation No C. difficile detected.  Final    Comment: Performed at [facility], [address] [place], [place] [address]  Gastrointestinal Panel by PCR , Stool     Status: None   Collection Time: 10/06/22  8:50 AM   Specimen: Stool  Result Value Ref Range Status   Campylobacter species NOT DETECTED NOT DETECTED Final   Plesimonas shigelloides NOT DETECTED NOT DETECTED Final   Salmonella species NOT DETECTED NOT DETECTED Final   Yersinia enterocolitica NOT DETECTED NOT DETECTED Final   Vibrio species NOT DETECTED NOT DETECTED Final   Vibrio cholerae NOT DETECTED NOT DETECTED Final   Enteroaggregative E coli (EAEC) NOT DETECTED NOT DETECTED Final   Enteropathogenic E coli (EPEC) NOT DETECTED NOT DETECTED Final   Enterotoxigenic E coli (ETEC) NOT DETECTED NOT DETECTED Final   Shiga like toxin producing E coli (STEC) NOT DETECTED NOT DETECTED Final   Shigella/Enteroinvasive E coli (EIEC) NOT DETECTED NOT DETECTED Final   Cryptosporidium NOT DETECTED NOT DETECTED Final   Cyclospora cayetanensis NOT DETECTED NOT DETECTED Final   Entamoeba histolytica NOT DETECTED NOT DETECTED Final   Giardia lamblia NOT DETECTED NOT DETECTED Final   Adenovirus F40/41 NOT DETECTED NOT DETECTED Final   Astrovirus NOT DETECTED NOT DETECTED Final   Norovirus GI/GII NOT DETECTED NOT DETECTED Final   Rotavirus A NOT DETECTED NOT DETECTED Final   Sapovirus (I, II, IV, and V) NOT DETECTED NOT DETECTED Final    Comment: Performed at  [facility], [address] [place], [place] [address]  Culture, blood (Routine X 2) w Reflex to ID Panel     Status: None (Preliminary result)   Collection Time: 10/06/22  6:45 PM   Specimen: BLOOD  Result Value Ref Range Status   Specimen Description BLOOD LEFT ASSIST CONTROL  Final   Special Requests   Final    BOTTLES DRAWN AEROBIC AND ANAEROBIC Blood Culture adequate volume   Culture   Final    NO GROWTH < 12 HOURS Performed at [facility], [address] [place], [place] [address]    Report Status PENDING  Incomplete  Culture, blood (Routine X 2) w Reflex to ID Panel     Status: None (Preliminary result)   Collection Time: 10/06/22  6:48 PM   Specimen: BLOOD  Result Value Ref Range Status   Specimen Description BLOOD LEFT HAND  Final   Special Requests   Final    BOTTLES DRAWN AEROBIC AND ANAEROBIC Blood Culture adequate volume   Culture   Final    NO GROWTH < 12 HOURS Performed at [facility], [address], [place], [place] [address]    Report Status PENDING  Incomplete     Total time spend on discharging this patient, including the last patient exam, discussing the hospital stay, instructions for ongoing care as it relates to all pertinent caregivers, as well as preparing the medical discharge records, prescriptions, and/or referrals as applicable, is 35 minutes.    Enzo Bi, MD  [facility] 10/07/2022, 2:39 PM

## 2022-10-07 NOTE — Plan of Care (Signed)
  Problem: Pain Managment: Goal: General experience of comfort will improve Outcome: Progressing   Problem: Safety: Goal: Ability to remain free from injury will improve Outcome: Progressing   Problem: Skin Integrity: Goal: Risk for impaired skin integrity will decrease Outcome: Progressing   

## 2022-10-08 LAB — URINE CULTURE: Culture: >=100000 — AB

## 2022-10-11 LAB — CULTURE, BLOOD (ROUTINE X 2)
Culture: NO GROWTH
Culture: NO GROWTH
Special Requests: ADEQUATE
Special Requests: ADEQUATE

## 2022-10-13 ENCOUNTER — Other Ambulatory Visit: Payer: Self-pay | Admitting: Family Medicine

## 2022-10-16 ENCOUNTER — Encounter: Payer: Self-pay | Admitting: Family Medicine

## 2022-10-16 ENCOUNTER — Ambulatory Visit (INDEPENDENT_AMBULATORY_CARE_PROVIDER_SITE_OTHER): Payer: PPO | Admitting: Family Medicine

## 2022-10-16 VITALS — BP 158/70 | HR 62 | Temp 97.7°F | Ht 64.0 in | Wt 144.0 lb

## 2022-10-16 DIAGNOSIS — I10 Essential (primary) hypertension: Secondary | ICD-10-CM

## 2022-10-16 DIAGNOSIS — K559 Vascular disorder of intestine, unspecified: Secondary | ICD-10-CM

## 2022-10-16 DIAGNOSIS — K5909 Other constipation: Secondary | ICD-10-CM | POA: Diagnosis not present

## 2022-10-16 MED ORDER — CARVEDILOL 3.125 MG PO TABS: 3.1250 mg | ORAL_TABLET | Freq: Two times a day (BID) | ORAL | 3 refills | Status: DC

## 2022-10-16 NOTE — Assessment & Plan Note (Addendum)
Chronic, BP elevated today but previously well controlled.  Will continue losartan '75mg'$  daily, will change atenolol to carvedilol 3.'125mg'$  BID.  I will ask her to monitor BP at home to notify me if consistently >140/90, and will reassess control at f/u visit in 6 wks.  Want to avoid hypotension due to ischemic colitis history.

## 2022-10-16 NOTE — Assessment & Plan Note (Signed)
Chronic issue, latest exacerbation this month s/p hospitalization, records reviewed with patient.  She does not currently have GI doctor. She last saw Dr Fuller Plan 2016.  Reviewed strategies to prevent exacerbation - avoiding dehydration, constipation, and overly aggressive treatment of hypertension. See above.

## 2022-10-16 NOTE — Progress Notes (Signed)
Patient ID: Bianca Fry, female    DOB: January 02, 1946, 76 y.o.   MRN: [id number]  This visit was conducted in person.  BP (!) 158/70   Pulse 62   Temp 97.7 F (36.5 C) (Temporal)   Ht '5\' 4"'$  (1.626 m)   Wt 144 lb (65.3 kg)   LMP 10/20/1979 (Within Years)   SpO2 96%   BMI 24.72 kg/m   BP Readings from Last 3 Encounters:  10/16/22 (!) 158/70  10/07/22 128/61  09/23/22 132/64  170/80 on repeat testing  CC: hosp f/u visit  Subjective:   HPI: Bianca Fry is a 76 y.o. female presenting on 10/16/2022 for Hospitalization Follow-up (Admitted on 10/06/22 at [facility],dx acute GI bleeding; acute colitis. )   Recent hospitalization for abdominal pain with bloody diarrhea. Diagnosed with acute ischemic colitis. C diff and GI pathogen panel returned normal. Treated with supportive care with resolution of symptoms. Found to have UCx growing E coli, as pt asxs from urinary standpoint thought colonization without infection.  Blood cultures NG final x2.  Blood pressures were low so she was treated with 1/2 dose atenolol, full dose losartan '75mg'$  daily.  Hospital records reviewed. Med rec performed.   Ppx keflex '250mg'$  daily was stopped. She has h/o interstitial cystitis as well as recurrent UTIs. She sees urology Dr Amalia Hailey for this.   Since home, LLQ and L side pain still intermittently tender but symptoms are improving.  Tends to be more constipated than loose stools.  Since home, she's been taking losartan '75mg'$  daily and only 1 atenolol '25mg'$  a day.   Last episode of ischemic colitis was 03/2021.   Colonoscopy 03/2021 - diverticulosis, int hem, biopsy consistent with ischemic colitis [employer] [facility]).   Home health not set up.  Other follow up appointments scheduled: none ______________________________________________________________________ Hospital admission: 10/06/2022 Hospital discharge: 10/07/2022 TCM f/u phone call: not performed   Admitted From: home Disposition:  home CODE STATUS: Full  code  Discharge Diagnoses:  Principal Problem:   Acute colitis Active Problems:   Recurrent UTI   HLD (hyperlipidemia)   Essential hypertension   GERD   CAD (coronary artery disease)   Anxiety     Relevant past medical, surgical, family and social history reviewed and updated as indicated. Interim medical history since our last visit reviewed. Allergies and medications reviewed and updated. Outpatient Medications Prior to Visit  Medication Sig Dispense Refill   ALPRAZolam (XANAX) 0.5 MG tablet TAKE ONE TABLET BY MOUTH TWICE A DAY AS NEEDED FOR ANXIETY 40 tablet 0   aspirin EC 81 MG tablet Take 1 tablet (81 mg total) by mouth daily. Swallow whole.     atorvastatin (LIPITOR) 10 MG tablet TAKE ONE TABLET BY MOUTH ONE TIME DAILY 90 tablet 2   Calcium Carb-Cholecalciferol (CALCIUM-VITAMIN D) 600-400 MG-UNIT TABS Take 1 tablet by mouth daily.     cholecalciferol (VITAMIN D) 25 MCG (1000 UNIT) tablet Take 1,000 Units by mouth daily.     conjugated estrogens (PREMARIN) vaginal cream Use 1/2 g vaginally and place a pea size amount to urethra two or three times per week as needed to maintain symptom relief. 30 g 2   Cyanocobalamin (B-12) 1000 MCG SUBL Place 1 tablet under the tongue daily.     dicyclomine (BENTYL) 10 MG capsule Take 1 capsule (10 mg total) by mouth 2 (two) times daily as needed for spasms. 90 capsule 1   EPINEPHrine 0.3 mg/0.3 mL IJ SOAJ injection Inject 0.3 mg into the muscle  as needed for anaphylaxis. 1 each 1   fexofenadine (ALLEGRA) 180 MG tablet Take 180 mg by mouth daily as needed for allergies.     losartan (COZAAR) 50 MG tablet TAKE ONE AND ONE-HALF TABLETS BY MOUTH ONE TIME DAILY 135 tablet 3   meloxicam (MOBIC) 7.5 MG tablet Take 7.5 mg by mouth daily.     omeprazole (PRILOSEC) 40 MG capsule TAKE ONE CAPSULE BY MOUTH ONE TIME DAILY 90 capsule 3   ondansetron (ZOFRAN) 4 MG tablet TAKE ONE TABLET BY MOUTH EVERY 8 HOURS AS NEEDED FOR NAUSEA AND VOMITING 20 tablet 0    triamcinolone cream (KENALOG) 0.1 % SMARTSIG:1 sparingly Topical Twice Daily     valACYclovir (VALTREX) 500 MG tablet Take one tablet (500 mg) by mouth twice a day for 3 days as needed for a genital outbreak.  Take 4 tablets (2000 mg) by mouth twice a day for 1 day as needed for an oral outbreak. 30 tablet 5   atenolol (TENORMIN) 25 MG tablet TAKE ONE TABLET BY MOUTH TWICE A DAY 180 tablet 3   No facility-administered medications prior to visit.     Per HPI unless specifically indicated in ROS section below Review of Systems  Objective:  BP (!) 158/70   Pulse 62   Temp 97.7 F (36.5 C) (Temporal)   Ht '5\' 4"'$  (1.626 m)   Wt 144 lb (65.3 kg)   LMP 10/20/1979 (Within Years)   SpO2 96%   BMI 24.72 kg/m   Wt Readings from Last 3 Encounters:  10/16/22 144 lb (65.3 kg)  10/06/22 145 lb (65.8 kg)  09/23/22 145 lb (65.8 kg)      Physical Exam Vitals and nursing note reviewed.  Constitutional:      Appearance: Normal appearance. She is not ill-appearing.  Eyes:     Extraocular Movements: Extraocular movements intact.     Pupils: Pupils are equal, round, and reactive to light.  Cardiovascular:     Rate and Rhythm: Normal rate and regular rhythm.     Pulses: Normal pulses.     Heart sounds: Normal heart sounds. No murmur heard. Pulmonary:     Effort: Pulmonary effort is normal. No respiratory distress.     Breath sounds: Normal breath sounds. No wheezing, rhonchi or rales.  Abdominal:     General: Bowel sounds are normal. There is no distension.     Palpations: Abdomen is soft. There is no mass.     Tenderness: There is abdominal tenderness (mild) in the left upper quadrant. There is no right CVA tenderness, left CVA tenderness or guarding.     Hernia: No hernia is present.     Comments: Chronic LUQ tenderness, today with reproducible tenderness to palpation of lower left ribcage  Musculoskeletal:     Right lower leg: No edema.     Left lower leg: No edema.  Skin:    General:  Skin is warm and dry.     Findings: No rash.  Neurological:     Mental Status: She is alert.  Psychiatric:        Mood and Affect: Mood normal.        Behavior: Behavior normal.       Lab Results  Component Value Date   CREATININE 0.77 10/07/2022   BUN 14 10/07/2022   NA 142 10/07/2022   K 3.5 10/07/2022   CL 112 (H) 10/07/2022   CO2 24 10/07/2022    Lab Results  Component Value Date  ALT 15 10/06/2022   AST 21 10/06/2022   ALKPHOS 75 10/06/2022   BILITOT 0.6 10/06/2022    Lab Results  Component Value Date   WBC 9.9 10/07/2022   HGB 10.3 (L) 10/07/2022   HCT 32.4 (L) 10/07/2022   MCV 90.0 10/07/2022   PLT 136 (L) 10/07/2022   CTA ABDOMEN AND PELVIS WITHOUT AND WITH CONTRAST IMPRESSION:  1. Positive for evidence of Acute Colitis from the splenic flexure through the sigmoid colon. Underlying extensive sigmoid diverticulosis. But no active gastrointestinal bleeding identified by CTA. No perforation or abscess. 2. No other acute or inflammatory process identified in the abdomen or pelvis. 3.  Aortic Atherosclerosis (ICD10-I70.0). Electronically Signed   By: Genevie Ann M.D.   On: 10/06/2022 06:26  Assessment & Plan:   Problem List Items Addressed This Visit     Essential hypertension    Chronic, BP elevated today but previously well controlled.  Will continue losartan '75mg'$  daily, will change atenolol to carvedilol 3.'125mg'$  BID.  I will ask her to monitor BP at home to notify me if consistently >140/90, and will reassess control at f/u visit in 6 wks.  Want to avoid hypotension due to ischemic colitis history.       Relevant Medications   carvedilol (COREG) 3.125 MG tablet   Ischemic colitis ([place]) - Primary    Chronic issue, latest exacerbation this month s/p hospitalization, records reviewed with patient.  She does not currently have GI doctor. She last saw Dr Fuller Plan 2016.  Reviewed strategies to prevent exacerbation - avoiding dehydration, constipation, and overly  aggressive treatment of hypertension. See above.       Chronic constipation    Endorses tends to get constipated - reviewed importance of good hydration status for colonic health in setting of ischemic colitis.  She states stool softener and miralax have been ineffective. Consider daily fiber supplement vs medication to prevent constipation.         Meds ordered this encounter  Medications   DISCONTD: carvedilol (COREG) 3.125 MG tablet    Sig: Take 1 tablet (3.125 mg total) by mouth 2 (two) times daily with a meal.    Dispense:  60 tablet    Refill:  3   carvedilol (COREG) 3.125 MG tablet    Sig: Take 1 tablet (3.125 mg total) by mouth 2 (two) times daily with a meal.    Dispense:  60 tablet    Refill:  3    To replace atenolol   No orders of the defined types were placed in this encounter.   Patient instructions: Avoid dehydration, constipation, and over-treatment of blood pressure to prevent ischemic colitis flares.  Push fluids. Increase water intake throughout the day.  BP is too high - continue losartan '75mg'$  daily, change atenolol to carvedilol 3.'125mg'$  twice daily.  Good to see you today Keep February appointment.   Follow up plan: No follow-ups on file.  Ria Bush, MD

## 2022-10-16 NOTE — Patient Instructions (Addendum)
Avoid dehydration, constipation, and over-treatment of blood pressure to prevent ischemic colitis flares.  Push fluids. Increase water intake throughout the day.  BP is too high - continue losartan '75mg'$  daily, change atenolol to carvedilol 3.'125mg'$  twice daily.  Good to see you today Keep February appointment.   Ischemic Colitis  Ischemic colitis is damage to the large intestine due to reduced blood flow (ischemia) to the colon. The colon is the last section of the large intestine, where stool is formed. Reduced blood flow may lead to the death of cells (necrosis) in the lining of the colon, damaging the colon and often causing bleeding. Most cases of ischemic colitis clear up in a few days with treatment. In other cases, blood flow does not improve, and parts of the colon start to die. This is extremely serious and even life-threatening. If this happens, surgery may be required. In some cases, parts of the colon may need to be removed. What are the causes? Ischemic colitis results from a decrease in the blood supply to the colon. Many conditions can cause this, such as: Heart problems that reduce blood flow to the arteries that supply the colon. These include problems, such as coronary heart disease, peripheral vascular disease, atrial fibrillation, and congestive heart failure. Low blood pressure from: An infection that spreads to the blood (sepsis). Dehydration or bleeding (shock). Drugs that narrow blood vessels (vasoconstrictors). Sometimes the cause is not known. What increases the risk? You are more likely to develop this condition if you: Are 33 years of age or older. Are female. Have another medical condition, such as: Heart disease. Diabetes. Kidney disease that requires you to be on dialysis. A disease that causes blood clots. Are frequently constipated. Have had surgery on the heart, blood vessels (such as the aorta), or colon. Take certain medicines or drugs, such  as: Medicines that suppress your immune system. Medicines that cause constipation. Illegal drugs, such as cocaine or methamphetamines. Get an extreme amount of exercise from long-distance bike riding or running. What are the signs or symptoms? Symptoms of this condition start suddenly and may include: Dull pain, usually on the left side of the abdomen (abdominal pain). Tenderness of the abdomen or abdominal cramps. An urgent need to have a bowel movement. Loose, bloody stools with clots of dark or bright red blood. Nausea and vomiting. Fever. Weakness, fatigue, and confusion. How is this diagnosed? This condition may be diagnosed based on: Your symptoms, your medical history, and a physical exam. Giving a complete and accurate medical history is important to make an accurate diagnosis. Tests to find out more about your condition and to rule out other causes of pain and bleeding. These tests may include: Blood tests to check for clotting, blood loss, and low proteins in your blood. CT scan of the colon. A procedure to examine the inside of your colon using a scope that is passed through the rectum (colonoscopy). Colonoscopy is the most important diagnostic test. During this test, your health care provider may take a small piece of tissue from your colon to be examined under a microscope (biopsy). An X-ray may also be taken of your chest and abdomen (stomach area). How is this treated? You may be hospitalized for treatment. Treatment usually includes: Not eating or drinking anything. This allows the colon to rest. IV fluids to maintain blood pressure, regulate salts and minerals in the blood (electrolytes), and provide nutrition. Having a tube inserted into your stomach through your nose (nasogastric tube) to drain  your stomach. IV antibiotic medicines. These may be used if an infection is suspected. Stopping or changing medicines that may be causing the condition. You may need surgery if  your condition is severe or if it gets worse or does not get better after a few days. Parts of the colon that will not recover may need to be removed. In some cases, a procedure is also done to attach the healthy part of the colon to the outer wall of the abdomen to drain stool (colostomy). Follow these instructions at home: Follow instructions from your health care provider about eating or drinking restrictions. Drink enough fluid to keep your urine pale yellow. Take over-the-counter and prescription medicines only as told by your health care provider. Take your antibiotic medicine as told by your health care provider. Do not stop taking the antibiotic even if you start to feel better Return to your normal activities as told by your health care provider. Ask your health care provider what activities are safe for you. Do not use any products that contain nicotine or tobacco. These products include cigarettes, chewing tobacco, and vaping devices, such as e-cigarettes. If you need help quitting, ask your health care provider. Keep all follow-up visits. This is important. Contact a health care provider if: You have blood in your stool. You have abdominal pain or cramps. You are constipated. You have nausea or vomiting. Get help right away if: You have a moderate to large amount of loose, bloody stools with dark or bright red blood clots. You have severe abdominal pain. Your abdominal pain has not improved after 24 hours. You have a fever. You have not been able to have a bowel movement, and you are in pain and vomiting. You have shortness of breath. You are very tired (lethargic) or have confusion. Summary Ischemic colitis is damage to the large intestine due to reduced blood flow (ischemia) to the colon. Some of the symptoms of this condition include abdominal pain or tenderness, bloody stools, and an urgent need to have a bowel movement. Diagnosis usually includes a procedure to examine the  inside of the colon using a scope that is passed through the rectum (colonoscopy). This information is not intended to replace advice given to you by your health care provider. Make sure you discuss any questions you have with your health care provider. Document Revised: 06/11/2020 Document Reviewed: 06/11/2020 Elsevier Patient Education  [employer].

## 2022-10-16 NOTE — Assessment & Plan Note (Signed)
Endorses tends to get constipated - reviewed importance of good hydration status for colonic health in setting of ischemic colitis.  She states stool softener and miralax have been ineffective. Consider daily fiber supplement vs medication to prevent constipation.

## 2022-10-30 ENCOUNTER — Other Ambulatory Visit: Payer: Self-pay | Admitting: Cardiovascular Disease

## 2022-10-30 NOTE — Telephone Encounter (Signed)
Please schedule F/U appointment for 90 day refills. Thank you!
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
## 2022-11-02 NOTE — Telephone Encounter (Signed)
Pt scheduled for 1/16.

## 2022-11-03 ENCOUNTER — Ambulatory Visit: Payer: PPO | Attending: Physician Assistant | Admitting: Physician Assistant

## 2022-11-03 ENCOUNTER — Encounter: Payer: Self-pay | Admitting: Physician Assistant

## 2022-11-03 ENCOUNTER — Ambulatory Visit (INDEPENDENT_AMBULATORY_CARE_PROVIDER_SITE_OTHER): Payer: PPO

## 2022-11-03 ENCOUNTER — Other Ambulatory Visit: Payer: Self-pay | Admitting: Family Medicine

## 2022-11-03 VITALS — BP 159/89 | HR 76 | Ht 64.0 in | Wt 144.4 lb

## 2022-11-03 DIAGNOSIS — R002 Palpitations: Secondary | ICD-10-CM | POA: Diagnosis not present

## 2022-11-03 DIAGNOSIS — I771 Stricture of artery: Secondary | ICD-10-CM

## 2022-11-03 DIAGNOSIS — I6522 Occlusion and stenosis of left carotid artery: Secondary | ICD-10-CM

## 2022-11-03 DIAGNOSIS — I700 Atherosclerosis of aorta: Secondary | ICD-10-CM | POA: Diagnosis not present

## 2022-11-03 DIAGNOSIS — R0600 Dyspnea, unspecified: Secondary | ICD-10-CM

## 2022-11-03 DIAGNOSIS — E785 Hyperlipidemia, unspecified: Secondary | ICD-10-CM | POA: Diagnosis not present

## 2022-11-03 DIAGNOSIS — I10 Essential (primary) hypertension: Secondary | ICD-10-CM

## 2022-11-03 NOTE — Patient Instructions (Addendum)
Medication Instructions:  No changes at this time.   *If you need a refill on your cardiac medications before your next appointment, please call your pharmacy*   Lab Work: None  If you have labs (blood work) drawn today and your tests are completely normal, you will receive your results only by: [place] (if you have MyChart) OR A paper copy in the mail If you have any lab test that is abnormal or we need to change your treatment, we will call you to review the results.   Testing/Procedures: Your physician has requested that you have an echocardiogram. Echocardiography is a painless test that uses sound waves to create images of your heart. It provides your doctor with information about the size and shape of your heart and how well your heart's chambers and valves are working. This procedure takes approximately one hour. There are no restrictions for this procedure. Please do NOT wear cologne, perfume, aftershave, or lotions (deodorant is allowed). Please arrive 15 minutes prior to your appointment time.   Your physician has recommended that you wear a Zio monitor for 14 days.   This monitor is a medical device that records the heart's electrical activity. Doctors most often use these monitors to diagnose arrhythmias. Arrhythmias are problems with the speed or rhythm of the heartbeat. The monitor is a small device applied to your chest. You can wear one while you do your normal daily activities. While wearing this monitor if you have any symptoms to push the button and record what you felt. Once you have worn this monitor for the period of time provider prescribed (Usually 14 days), you will return the monitor device in the postage paid box. Once it is returned they will download the data collected and provide [place] with a report which the provider will then review and we will call you with those results. Important tips:  Avoid showering during the first 24 hours of wearing the  monitor. Avoid excessive sweating to help maximize wear time. Do not submerge the device, no hot tubs, and no swimming pools. Keep any lotions or oils away from the patch. After 24 hours you may shower with the patch on. Take brief showers with your back facing the shower head.  Do not remove patch once it has been placed because that will interrupt data and decrease adhesive wear time. Push the button when you have any symptoms and write down what you were feeling. Once you have completed wearing your monitor, remove and place into box which has postage paid and place in your outgoing mailbox.  If for some reason you have misplaced your box then call our office and we can provide another box and/or mail it off for you.    Follow-Up: At [facility], you and your health needs are our priority.  As part of our continuing mission to provide you with exceptional heart care, we have created designated Provider Care Teams.  These Care Teams include your primary Cardiologist (physician) and Advanced Practice Providers (APPs -  Physician Assistants and Nurse Practitioners) who all work together to provide you with the care you need, when you need it.   Your next appointment:   Follow up after testing.  Provider:   Ida Rogue, MD or Christell Faith, PA-C

## 2022-11-03 NOTE — Telephone Encounter (Signed)
Name of Medication: Alprazolam Name of Pharmacy: [facility] or Written Date and Quantity: 08/26/22, #40 Last Office Visit and Type: 10/16/22, hosp f/u Next Office Visit and Type: 11/23/22, 6 mo f/u Last Controlled Substance Agreement Date: 04/16/17 Last UDS: 04/16/17

## 2022-11-04 DIAGNOSIS — H16141 Punctate keratitis, right eye: Secondary | ICD-10-CM | POA: Diagnosis not present

## 2022-11-04 DIAGNOSIS — Z961 Presence of intraocular lens: Secondary | ICD-10-CM | POA: Diagnosis not present

## 2022-11-04 DIAGNOSIS — H02051 Trichiasis without entropian right upper eyelid: Secondary | ICD-10-CM | POA: Diagnosis not present

## 2022-11-04 DIAGNOSIS — H01023 Squamous blepharitis right eye, unspecified eyelid: Secondary | ICD-10-CM | POA: Diagnosis not present

## 2022-11-04 DIAGNOSIS — H02401 Unspecified ptosis of right eyelid: Secondary | ICD-10-CM | POA: Diagnosis not present

## 2022-11-04 NOTE — Telephone Encounter (Signed)
ERx 

## 2022-11-05 DIAGNOSIS — R002 Palpitations: Secondary | ICD-10-CM

## 2022-11-12 ENCOUNTER — Encounter: Payer: Self-pay | Admitting: Family Medicine

## 2022-11-12 ENCOUNTER — Ambulatory Visit (INDEPENDENT_AMBULATORY_CARE_PROVIDER_SITE_OTHER): Payer: PPO | Admitting: Family Medicine

## 2022-11-12 VITALS — BP 134/58 | HR 89 | Temp 97.4°F | Resp 16 | Ht 64.0 in | Wt 145.1 lb

## 2022-11-12 DIAGNOSIS — R52 Pain, unspecified: Secondary | ICD-10-CM | POA: Diagnosis not present

## 2022-11-12 DIAGNOSIS — R051 Acute cough: Secondary | ICD-10-CM | POA: Insufficient documentation

## 2022-11-12 NOTE — Patient Instructions (Addendum)
Rest, fluids.  Start flonase 2 spray per nostril daily.  Continue benzonatate twice daily as needed for cough.  We will call with test results.

## 2022-11-12 NOTE — Progress Notes (Signed)
Patient ID: Bianca Fry, female    DOB: [birth year], 77 y.o.   MRN: [id number]  This visit was conducted in person.  BP (!) 134/58   Pulse 89   Temp (!) 97.4 F (36.3 C)   Resp 16   Ht '5\' 4"'$  (1.626 m)   Wt 145 lb 2 oz (65.8 kg)   LMP 10/20/1979 (Within Years)   SpO2 97%   BMI 24.91 kg/m    CC:  Chief Complaint  Patient presents with   Cough    X 3 days   Headache    - covid last night    Subjective:   HPI: Bianca Fry is a 77 y.o. female  pt of Bianca Bush, MD With history of CAD, HTN presenting on 11/12/2022 for Cough (X 3 days) and Headache (- covid last night)   Date of onset: 3 days ago Initial symptoms included  cough, dry Symptoms progressed to fatigue, body aches, headache, hot flashes and chills.  Chest achy.  Feeling some nausea.  No SOB, no wheeze.   Sick contacts: kids had flu in 09/2022, grandson  on Sunday had a cold. COVID testing:    negative.    She has tried to treat with  zyrtec, tessalon perles, mucinex      No history of chronic lung disease such as asthma or COPD. Non-smoker.    Relevant past medical, surgical, family and social history reviewed and updated as indicated. Interim medical history since our last visit reviewed. Allergies and medications reviewed and updated. Outpatient Medications Prior to Visit  Medication Sig Dispense Refill   ALPRAZolam (XANAX) 0.5 MG tablet TAKE ONE TABLET BY MOUTH TWICE A DAY AS NEEDED FOR ANXIETY 40 tablet 0   aspirin EC 81 MG tablet Take 1 tablet (81 mg total) by mouth daily. Swallow whole.     atorvastatin (LIPITOR) 10 MG tablet TAKE ONE TABLET BY MOUTH ONE TIME DAILY 90 tablet 0   Calcium Carb-Cholecalciferol (CALCIUM-VITAMIN D) 600-400 MG-UNIT TABS Take 1 tablet by mouth daily.     carvedilol (COREG) 3.125 MG tablet Take 1 tablet (3.125 mg total) by mouth 2 (two) times daily with a meal. 60 tablet 3   cephALEXin (KEFLEX) 250 MG capsule Take 250 mg by mouth as needed.      cholecalciferol (VITAMIN D) 25 MCG (1000 UNIT) tablet Take 1,000 Units by mouth daily.     conjugated estrogens (PREMARIN) vaginal cream Use 1/2 g vaginally and place a pea size amount to urethra two or three times per week as needed to maintain symptom relief. 30 g 2   Cyanocobalamin (B-12) 1000 MCG SUBL Place 1 tablet under the tongue daily.     dicyclomine (BENTYL) 10 MG capsule Take 1 capsule (10 mg total) by mouth 2 (two) times daily as needed for spasms. 90 capsule 1   EPINEPHrine 0.3 mg/0.3 mL IJ SOAJ injection Inject 0.3 mg into the muscle as needed for anaphylaxis. 1 each 1   fexofenadine (ALLEGRA) 180 MG tablet Take 180 mg by mouth daily as needed for allergies.     losartan (COZAAR) 50 MG tablet TAKE ONE AND ONE-HALF TABLETS BY MOUTH ONE TIME DAILY 135 tablet 3   meloxicam (MOBIC) 7.5 MG tablet Take 7.5 mg by mouth daily.     omeprazole (PRILOSEC) 40 MG capsule TAKE ONE CAPSULE BY MOUTH ONE TIME DAILY 90 capsule 3   ondansetron (ZOFRAN) 4 MG tablet TAKE ONE TABLET BY MOUTH EVERY 8 HOURS AS  NEEDED FOR NAUSEA AND VOMITING 20 tablet 0   triamcinolone cream (KENALOG) 0.1 % SMARTSIG:1 sparingly Topical Twice Daily     valACYclovir (VALTREX) 500 MG tablet Take one tablet (500 mg) by mouth twice a day for 3 days as needed for a genital outbreak.  Take 4 tablets (2000 mg) by mouth twice a day for 1 day as needed for an oral outbreak. 30 tablet 5   No facility-administered medications prior to visit.     Per HPI unless specifically indicated in ROS section below Review of Systems  Constitutional:  Positive for chills and fatigue. Negative for fever.  HENT:  Positive for congestion.   Eyes:  Negative for pain.  Respiratory:  Positive for cough. Negative for shortness of breath.   Cardiovascular:  Negative for chest pain, palpitations and leg swelling.  Gastrointestinal:  Negative for abdominal pain.  Genitourinary:  Negative for dysuria and vaginal bleeding.  Musculoskeletal:  Positive for  myalgias. Negative for back pain.  Neurological:  Negative for syncope, light-headedness and headaches.  Psychiatric/Behavioral:  Negative for dysphoric mood.    Objective:  BP (!) 134/58   Pulse 89   Temp (!) 97.4 F (36.3 C)   Resp 16   Ht '5\' 4"'$  (1.626 m)   Wt 145 lb 2 oz (65.8 kg)   LMP 10/20/1979 (Within Years)   SpO2 97%   BMI 24.91 kg/m   Wt Readings from Last 3 Encounters:  11/12/22 145 lb 2 oz (65.8 kg)  11/03/22 144 lb 6 oz (65.5 kg)  10/16/22 144 lb (65.3 kg)      Physical Exam Constitutional:      General: She is not in acute distress.    Appearance: Normal appearance. She is well-developed. She is ill-appearing. She is not toxic-appearing.  HENT:     Head: Normocephalic.     Right Ear: Hearing, tympanic membrane, ear canal and external ear normal. Tympanic membrane is not erythematous, retracted or bulging.     Left Ear: Hearing, tympanic membrane, ear canal and external ear normal. Tympanic membrane is not erythematous, retracted or bulging.     Nose: Mucosal edema and rhinorrhea present.     Right Sinus: No maxillary sinus tenderness or frontal sinus tenderness.     Left Sinus: No maxillary sinus tenderness or frontal sinus tenderness.     Mouth/Throat:     Pharynx: Uvula midline.  Eyes:     General: Lids are normal. Lids are everted, no foreign bodies appreciated.     Conjunctiva/sclera: Conjunctivae normal.     Pupils: Pupils are equal, round, and reactive to light.  Neck:     Thyroid: No thyroid mass or thyromegaly.     Vascular: No carotid bruit.     Trachea: Trachea normal.  Cardiovascular:     Rate and Rhythm: Normal rate and regular rhythm.     Pulses: Normal pulses.     Heart sounds: Normal heart sounds, S1 normal and S2 normal. No murmur heard.    No friction rub. No gallop.  Pulmonary:     Effort: Pulmonary effort is normal. No tachypnea or respiratory distress.     Breath sounds: Normal breath sounds. No decreased breath sounds, wheezing,  rhonchi or rales.  Abdominal:     General: Bowel sounds are normal.     Palpations: Abdomen is soft.     Tenderness: There is no abdominal tenderness.  Musculoskeletal:     Cervical back: Normal range of motion and neck supple.  Skin:  General: Skin is warm and dry.     Findings: No rash.  Neurological:     Mental Status: She is alert.  Psychiatric:        Mood and Affect: Mood is not anxious or depressed.        Speech: Speech normal.        Behavior: Behavior normal. Behavior is cooperative.        Thought Content: Thought content normal.        Judgment: Judgment normal.       Results for orders placed or performed during the hospital encounter of 10/06/22  Culture, blood (Routine X 2) w Reflex to ID Panel   Specimen: BLOOD  Result Value Ref Range   Specimen Description BLOOD LEFT ASSIST CONTROL    Special Requests      BOTTLES DRAWN AEROBIC AND ANAEROBIC Blood Culture adequate volume   Culture      NO GROWTH 5 DAYS Performed at [facility], [address] [place], [place] [address]    Report Status 10/11/2022 FINAL   Culture, blood (Routine X 2) w Reflex to ID Panel   Specimen: BLOOD  Result Value Ref Range   Specimen Description BLOOD LEFT HAND    Special Requests      BOTTLES DRAWN AEROBIC AND ANAEROBIC Blood Culture adequate volume   Culture      NO GROWTH 5 DAYS Performed at [facility], [address] [place], [place] [address]    Report Status 10/11/2022 FINAL   Urine Culture   Specimen: Urine, Clean Catch  Result Value Ref Range   Specimen Description      URINE, CLEAN CATCH Performed at [facility], [address], [place], [place] [address]    Special Requests      NONE Performed at [facility], [address], [place], [place] [address]    Culture >=100,000 COLONIES/mL ESCHERICHIA COLI (A)    Report Status 10/08/2022 FINAL    Organism ID, Bacteria ESCHERICHIA COLI (A)       Susceptibility    Escherichia coli - MIC*    AMPICILLIN >=32 RESISTANT Resistant     CEFAZOLIN <=4 SENSITIVE Sensitive     CEFEPIME <=0.12 SENSITIVE Sensitive     CEFTRIAXONE <=0.25 SENSITIVE Sensitive     CIPROFLOXACIN >=4 RESISTANT Resistant     GENTAMICIN <=1 SENSITIVE Sensitive     IMIPENEM <=0.25 SENSITIVE Sensitive     NITROFURANTOIN <=16 SENSITIVE Sensitive     TRIMETH/SULFA >=320 RESISTANT Resistant     AMPICILLIN/SULBACTAM 16 INTERMEDIATE Intermediate     PIP/TAZO <=4 SENSITIVE Sensitive     * >=100,000 COLONIES/mL ESCHERICHIA COLI  C Difficile Quick Screen w PCR reflex   Specimen: STOOL  Result Value Ref Range   C Diff antigen NEGATIVE NEGATIVE   C Diff toxin NEGATIVE NEGATIVE   C Diff interpretation No C. difficile detected.   Gastrointestinal Panel by PCR , Stool   Specimen: Stool  Result Value Ref Range   Campylobacter species NOT DETECTED NOT DETECTED   Plesimonas shigelloides NOT DETECTED NOT DETECTED   Salmonella species NOT DETECTED NOT DETECTED   Yersinia enterocolitica NOT DETECTED NOT DETECTED   Vibrio species NOT DETECTED NOT DETECTED   Vibrio cholerae NOT DETECTED NOT DETECTED   Enteroaggregative E coli (EAEC) NOT DETECTED NOT DETECTED   Enteropathogenic E coli (EPEC) NOT DETECTED NOT DETECTED   Enterotoxigenic E coli (ETEC) NOT DETECTED NOT DETECTED   Shiga like toxin producing E  coli (STEC) NOT DETECTED NOT DETECTED   Shigella/Enteroinvasive E coli (EIEC) NOT DETECTED NOT DETECTED   Cryptosporidium NOT DETECTED NOT DETECTED   Cyclospora cayetanensis NOT DETECTED NOT DETECTED   Entamoeba histolytica NOT DETECTED NOT DETECTED   Giardia lamblia NOT DETECTED NOT DETECTED   Adenovirus F40/41 NOT DETECTED NOT DETECTED   Astrovirus NOT DETECTED NOT DETECTED   Norovirus GI/GII NOT DETECTED NOT DETECTED   Rotavirus A NOT DETECTED NOT DETECTED   Sapovirus (I, II, IV, and V) NOT DETECTED NOT DETECTED  CBC with Differential  Result Value Ref Range   WBC 14.8 (H) 4.0 - 10.5 K/uL    RBC 4.30 3.87 - 5.11 MIL/uL   Hemoglobin 12.4 12.0 - 15.0 g/dL   HCT 39.2 36.0 - 46.0 %   MCV 91.2 80.0 - 100.0 fL   MCH 28.8 26.0 - 34.0 pg   MCHC 31.6 30.0 - 36.0 g/dL   RDW 14.6 11.5 - 15.5 %   Platelets 187 150 - 400 K/uL   nRBC 0.0 0.0 - 0.2 %   Neutrophils Relative % 71 %   Neutro Abs 10.5 (H) 1.7 - 7.7 K/uL   Lymphocytes Relative 18 %   Lymphs Abs 2.7 0.7 - 4.0 K/uL   Monocytes Relative 9 %   Monocytes Absolute 1.3 (H) 0.1 - 1.0 K/uL   Eosinophils Relative 1 %   Eosinophils Absolute 0.2 0.0 - 0.5 K/uL   Basophils Relative 0 %   Basophils Absolute 0.0 0.0 - 0.1 K/uL   Immature Granulocytes 1 %   Abs Immature Granulocytes 0.09 (H) 0.00 - 0.07 K/uL  Comprehensive metabolic panel  Result Value Ref Range   Sodium 139 135 - 145 mmol/L   Potassium 3.8 3.5 - 5.1 mmol/L   Chloride 109 98 - 111 mmol/L   CO2 22 22 - 32 mmol/L   Glucose, Bld 127 (H) 70 - 99 mg/dL   BUN 20 8 - 23 mg/dL   Creatinine, Ser 0.96 0.44 - 1.00 mg/dL   Calcium 9.3 8.9 - 10.3 mg/dL   Total Protein 6.7 6.5 - 8.1 g/dL   Albumin 3.7 3.5 - 5.0 g/dL   AST 21 15 - 41 U/L   ALT 15 0 - 44 U/L   Alkaline Phosphatase 75 38 - 126 U/L   Total Bilirubin 0.6 0.3 - 1.2 mg/dL   GFR, Estimated >60 >60 mL/min   Anion gap 8 5 - 15  Lipase, blood  Result Value Ref Range   Lipase 32 11 - 51 U/L  Urinalysis, Routine w reflex microscopic  Result Value Ref Range   Color, Urine YELLOW (A) YELLOW   APPearance HAZY (A) CLEAR   Specific Gravity, Urine 1.018 1.005 - 1.030   pH 5.0 5.0 - 8.0   Glucose, UA NEGATIVE NEGATIVE mg/dL   Hgb urine dipstick SMALL (A) NEGATIVE   Bilirubin Urine NEGATIVE NEGATIVE   Ketones, ur NEGATIVE NEGATIVE mg/dL   Protein, ur NEGATIVE NEGATIVE mg/dL   Nitrite NEGATIVE NEGATIVE   Leukocytes,Ua MODERATE (A) NEGATIVE   RBC / HPF 0-5 0 - 5 RBC/hpf   WBC, UA >50 (H) 0 - 5 WBC/hpf   Bacteria, UA RARE (A) NONE SEEN   Squamous Epithelial / HPF 0-5 0 - 5   Mucus PRESENT    Hyaline Casts, UA  PRESENT   Protime-INR  Result Value Ref Range   Prothrombin Time 14.1 11.4 - 15.2 seconds   INR 1.1 0.8 - 1.2  APTT  Result Value Ref  Range   aPTT 28 24 - 36 seconds  CBC  Result Value Ref Range   WBC 10.7 (H) 4.0 - 10.5 K/uL   RBC 3.89 3.87 - 5.11 MIL/uL   Hemoglobin 11.2 (L) 12.0 - 15.0 g/dL   HCT 35.4 (L) 36.0 - 46.0 %   MCV 91.0 80.0 - 100.0 fL   MCH 28.8 26.0 - 34.0 pg   MCHC 31.6 30.0 - 36.0 g/dL   RDW 14.6 11.5 - 15.5 %   Platelets 158 150 - 400 K/uL   nRBC 0.0 0.0 - 0.2 %  CBC  Result Value Ref Range   WBC 8.4 4.0 - 10.5 K/uL   RBC 3.47 (L) 3.87 - 5.11 MIL/uL   Hemoglobin 10.0 (L) 12.0 - 15.0 g/dL   HCT 31.3 (L) 36.0 - 46.0 %   MCV 90.2 80.0 - 100.0 fL   MCH 28.8 26.0 - 34.0 pg   MCHC 31.9 30.0 - 36.0 g/dL   RDW 14.7 11.5 - 15.5 %   Platelets 146 (L) 150 - 400 K/uL   nRBC 0.0 0.0 - 0.2 %  Basic metabolic panel  Result Value Ref Range   Sodium 142 135 - 145 mmol/L   Potassium 3.5 3.5 - 5.1 mmol/L   Chloride 112 (H) 98 - 111 mmol/L   CO2 24 22 - 32 mmol/L   Glucose, Bld 99 70 - 99 mg/dL   BUN 14 8 - 23 mg/dL   Creatinine, Ser 0.77 0.44 - 1.00 mg/dL   Calcium 8.5 (L) 8.9 - 10.3 mg/dL   GFR, Estimated >60 >60 mL/min   Anion gap 6 5 - 15  CBC  Result Value Ref Range   WBC 8.2 4.0 - 10.5 K/uL   RBC 3.38 (L) 3.87 - 5.11 MIL/uL   Hemoglobin 9.8 (L) 12.0 - 15.0 g/dL   HCT 30.7 (L) 36.0 - 46.0 %   MCV 90.8 80.0 - 100.0 fL   MCH 29.0 26.0 - 34.0 pg   MCHC 31.9 30.0 - 36.0 g/dL   RDW 14.7 11.5 - 15.5 %   Platelets 144 (L) 150 - 400 K/uL   nRBC 0.0 0.0 - 0.2 %  CBC  Result Value Ref Range   WBC 10.1 4.0 - 10.5 K/uL   RBC 4.05 3.87 - 5.11 MIL/uL   Hemoglobin 11.4 (L) 12.0 - 15.0 g/dL   HCT 36.4 36.0 - 46.0 %   MCV 89.9 80.0 - 100.0 fL   MCH 28.1 26.0 - 34.0 pg   MCHC 31.3 30.0 - 36.0 g/dL   RDW 14.6 11.5 - 15.5 %   Platelets 163 150 - 400 K/uL   nRBC 0.0 0.0 - 0.2 %  CBC  Result Value Ref Range   WBC 9.9 4.0 - 10.5 K/uL   RBC 3.60 (L) 3.87 - 5.11  MIL/uL   Hemoglobin 10.3 (L) 12.0 - 15.0 g/dL   HCT 32.4 (L) 36.0 - 46.0 %   MCV 90.0 80.0 - 100.0 fL   MCH 28.6 26.0 - 34.0 pg   MCHC 31.8 30.0 - 36.0 g/dL   RDW 14.6 11.5 - 15.5 %   Platelets 136 (L) 150 - 400 K/uL   nRBC 0.0 0.0 - 0.2 %  Type and screen [facility]  Result Value Ref Range   ABO/RH(D) O POS    Antibody Screen NEG    Sample Expiration      10/09/2022,2359 Performed at [facility], [address], [place], [place] [address]  Assessment and Plan  Body aches -     COVID-19, Flu A+B and RSV  Acute cough Assessment & Plan: Acute, consistent with viral upper respiratory tract infection.  Will send out testing for flu, RSV and COVID. Recommended symptomatic care, rest and fluids.  She can start Flonase 2 sprays per nostril daily.  Continue benzonatate 200 mg twice daily as needed.  ER and return precautions provided.     Return if symptoms worsen or fail to improve.   Eliezer Lofts, MD

## 2022-11-12 NOTE — Assessment & Plan Note (Signed)
Acute, consistent with viral upper respiratory tract infection.  Will send out testing for flu, RSV and COVID. Recommended symptomatic care, rest and fluids.  She can start Flonase 2 sprays per nostril daily.  Continue benzonatate 200 mg twice daily as needed.  ER and return precautions provided.

## 2022-11-13 ENCOUNTER — Other Ambulatory Visit: Payer: Self-pay | Admitting: Family Medicine

## 2022-11-13 LAB — COVID-19, FLU A+B AND RSV
Influenza A, NAA: NOT DETECTED
Influenza B, NAA: NOT DETECTED
RSV, NAA: NOT DETECTED
SARS-CoV-2, NAA: DETECTED — AB

## 2022-11-13 MED ORDER — NIRMATRELVIR/RITONAVIR (PAXLOVID)TABLET: 3.0000 | ORAL_TABLET | Freq: Two times a day (BID) | ORAL | 0 refills | Status: AC

## 2022-11-13 NOTE — Addendum Note (Signed)
Addended by: Eliezer Lofts E on: 11/13/2022 04:58 PM   Modules accepted: Orders

## 2022-11-16 ENCOUNTER — Emergency Department: Payer: PPO

## 2022-11-16 ENCOUNTER — Emergency Department
Admission: EM | Admit: 2022-11-16 | Discharge: 2022-11-16 | Disposition: A | Discharge status: Home / Self Care | Payer: PPO | Attending: Emergency Medicine | Admitting: Emergency Medicine

## 2022-11-16 ENCOUNTER — Telehealth: Payer: Self-pay | Admitting: Family Medicine

## 2022-11-16 DIAGNOSIS — E876 Hypokalemia: Secondary | ICD-10-CM | POA: Diagnosis not present

## 2022-11-16 DIAGNOSIS — K529 Noninfective gastroenteritis and colitis, unspecified: Secondary | ICD-10-CM | POA: Diagnosis not present

## 2022-11-16 DIAGNOSIS — R197 Diarrhea, unspecified: Secondary | ICD-10-CM

## 2022-11-16 DIAGNOSIS — K5730 Diverticulosis of large intestine without perforation or abscess without bleeding: Secondary | ICD-10-CM | POA: Diagnosis not present

## 2022-11-16 DIAGNOSIS — I2510 Atherosclerotic heart disease of native coronary artery without angina pectoris: Secondary | ICD-10-CM | POA: Insufficient documentation

## 2022-11-16 DIAGNOSIS — I10 Essential (primary) hypertension: Secondary | ICD-10-CM | POA: Insufficient documentation

## 2022-11-16 DIAGNOSIS — N3289 Other specified disorders of bladder: Secondary | ICD-10-CM | POA: Diagnosis not present

## 2022-11-16 DIAGNOSIS — K6389 Other specified diseases of intestine: Secondary | ICD-10-CM | POA: Diagnosis not present

## 2022-11-16 LAB — LIPASE, BLOOD: Lipase: 28 U/L (ref 11–51)

## 2022-11-16 LAB — COMPREHENSIVE METABOLIC PANEL
ALT: 12 U/L (ref 0–44)
AST: 19 U/L (ref 15–41)
Albumin: 3.7 g/dL (ref 3.5–5.0)
Alkaline Phosphatase: 81 U/L (ref 38–126)
Anion gap: 11 (ref 5–15)
BUN: 13 mg/dL (ref 8–23)
CO2: 24 mmol/L (ref 22–32)
Calcium: 9.6 mg/dL (ref 8.9–10.3)
Chloride: 103 mmol/L (ref 98–111)
Creatinine, Ser: 0.86 mg/dL (ref 0.44–1.00)
GFR, Estimated: >60 mL/min (ref >60)
Glucose, Bld: 135 mg/dL — ABNORMAL HIGH (ref 70–99)
Potassium: 3.3 mmol/L — ABNORMAL LOW (ref 3.5–5.1)
Sodium: 138 mmol/L (ref 135–145)
Total Bilirubin: 0.6 mg/dL (ref 0.3–1.2)
Total Protein: 7.1 g/dL (ref 6.5–8.1)

## 2022-11-16 LAB — URINALYSIS, ROUTINE W REFLEX MICROSCOPIC
Bilirubin Urine: NEGATIVE
Glucose, UA: NEGATIVE mg/dL
Ketones, ur: NEGATIVE mg/dL
Nitrite: NEGATIVE
Protein, ur: 100 mg/dL — AB
Specific Gravity, Urine: 1.021 (ref 1.005–1.030)
WBC, UA: >50 WBC/hpf (ref 0–5)
pH: 5 (ref 5.0–8.0)

## 2022-11-16 LAB — CBC
HCT: 39.5 % (ref 36.0–46.0)
Hemoglobin: 12.5 g/dL (ref 12.0–15.0)
MCH: 28.3 pg (ref 26.0–34.0)
MCHC: 31.6 g/dL (ref 30.0–36.0)
MCV: 89.6 fL (ref 80.0–100.0)
Platelets: 159 10*3/uL (ref 150–400)
RBC: 4.41 MIL/uL (ref 3.87–5.11)
RDW: 12.4 % (ref 11.5–15.5)
WBC: 7.3 10*3/uL (ref 4.0–10.5)
nRBC: 0 % (ref 0.0–0.2)

## 2022-11-16 MED ORDER — LACTATED RINGERS IV BOLUS
1000.0000 mL | Freq: Once | INTRAVENOUS | Status: AC
  Administered 2022-11-16: 1000 mL via INTRAVENOUS

## 2022-11-16 MED ORDER — ONDANSETRON 4 MG PO TBDP: 4.0000 mg | ORAL_TABLET | Freq: Three times a day (TID) | ORAL | 0 refills | Status: DC | PRN

## 2022-11-16 MED ORDER — IOHEXOL 350 MG/ML SOLN
100.0000 mL | Freq: Once | INTRAVENOUS | Status: AC | PRN
  Administered 2022-11-16: 100 mL via INTRAVENOUS

## 2022-11-16 MED ORDER — CEPHALEXIN 500 MG PO CAPS: 500.0000 mg | ORAL_CAPSULE | Freq: Two times a day (BID) | ORAL | 0 refills | Status: AC

## 2022-11-16 MED ORDER — POTASSIUM CHLORIDE CRYS ER 20 MEQ PO TBCR
20.0000 meq | EXTENDED_RELEASE_TABLET | Freq: Once | ORAL | Status: AC
  Administered 2022-11-16: 20 meq via ORAL
  Filled 2022-11-16: qty 1

## 2022-11-16 NOTE — ED Triage Notes (Signed)
Pt presents to the ED via POV due to diarrhea. Pt states she was recently diagnosed with COVID and was given paxlovid which triggered diarrhea. Pt has a hx of ischemic colitis. Pt states she had multiple episode of bloody stool but they have stopped now. Pt is A&Ox4

## 2022-11-16 NOTE — Discharge Instructions (Addendum)
-  Based on my examination, your CT scan, your lab work, and my discussion with the on-call gastroenterologist, your diarrhea is likely related to the COVID infection versus possible side effect from the Paxil of it.  Since your COVID and for and is improving, you do not need to continue taking the Paxlovid.  You may take the ondansetron as needed for the nausea/vomiting.  -You are found to have a urinary tract infection.  Please take the cephalexin for the full course as prescribed.  -Please follow-up with a gastroenterologist.  It may be your own, or the gastroenterologist listed on this page.  -Return to the emergency department anytime if you begin to experience any new or worsening symptoms.

## 2022-11-16 NOTE — Telephone Encounter (Signed)
[facility] - Client TELEPHONE ADVICE RECORD [facility] Patient Name: Bianca Fry Gender: Female DOB: [birth year] Age: 77 Y 10 M 25 D Return Phone Number: [phone] (Primary), [phone] (Secondary) Address: City/ State/ Zip: [place] [address] Client [facility] - Client Client Site [place] - Day Provider Eliezer Lofts - MD Contact Type Call Who Is Calling Patient / Member / Family / Caregiver Call Type Triage / Clinical Relationship To Patient Self Return Phone Number [phone] (Secondary) Chief Complaint Blood In Stool Reason for Call Symptomatic / Request for [place] states she was diagnosed with Covid and the Paxlovid she is taking is causing diarrhea with bleeding. Translation No Nurse Assessment Nurse: Markus Daft, RN, Sherre Poot Date/Time (Eastern Time): 11/16/2022 8:32:26 AM Confirm and document reason for call. If symptomatic, describe symptoms. ---Caller states that she has been having vomiting and diarrhea with bleeding. Happened Saturday afternoon after 2 doses of Paxlovid. Bleeding stopped Sunday am. Still having diarrhea, small amt each time. More than 8 episodes. Unable to make it to the bathroom in time. - Diagnosed with Covid. Does the patient have any new or worsening symptoms? ---Yes Will a triage be completed? ---Yes Related visit to physician within the last 2 weeks? ---No Does the PT have any chronic conditions? (i.e. diabetes, asthma, this includes High risk factors for pregnancy, etc.) ---Yes List chronic conditions. ---ischemic colitis (normally requires antibiotic when she is having problems) Is this a behavioral health or substance abuse call? ---No Guidelines Guideline Title Affirmed Question Affirmed Notes Nurse Date/Time (Eastern Time) Diarrhea [1] Blood in the stool AND [2] moderate or large amount of blood Markus Daft, RN,  Sherre Poot 11/16/2022 8:36:37 AM PLEASE NOTE: All timestamps contained within this report are represented as [place] Standard Time. CONFIDENTIALTY NOTICE: This fax transmission is intended only for the addressee. It contains information that is legally privileged, confidential or otherwise protected from use or disclosure. If you are not the intended recipient, you are strictly prohibited from reviewing, disclosing, copying using or disseminating any of this information or taking any action in reliance on or regarding this information. If you have received this fax in error, please notify us immediately by telephone so that we can arrange for its return to [place]. Phone: [phone], Toll-Free: [phone], Fax: [phone] Page: 2 of 2 Call Id: [id number] [place]. Time Eilene Ghazi Time) Disposition Final User 11/16/2022 8:32:49 AM Attempt made - message left Jackqulyn Livings 11/16/2022 8:38:05 AM Go to ED Now Yes Markus Daft, RN, Sherre Poot Final Disposition 11/16/2022 8:38:05 AM Go to ED Now Yes Markus Daft, RN, Kenton Kingfisher Disagree/Comply Comply Caller Understands Yes PreDisposition Call Doctor Care Advice Given Per Guideline GO TO ED NOW: * You need to be seen in the Emergency Department. ANOTHER ADULT SHOULD DRIVE: * It is better and safer if another adult drives instead of you. BRING MEDICINES: * Bring a list of your current medicines when you go to the Emergency Department (ER). CARE ADVICE given per Diarrhea (Adult) guideline. Comments User: Mayford Knife, RN Date/Time Eilene Ghazi Time): 11/16/2022 8:36:54 AM No more vomiting in over 24 hrs. Continued nausea. No abdominal pain today. User: Mayford Knife, RN Date/Time Eilene Ghazi Time): 11/16/2022 8:40:23 AM Caller requesting antibiotics. RN advised that this can not be done w/o being seen, and advised ED as this could be more than her h/o ischemic colitis even. Caller verbalized understanding. Referrals [place]

## 2022-11-16 NOTE — ED Notes (Signed)
Helped pt to reposition on the bed, hooked her fluids back up and made sure they had their call bell.

## 2022-11-16 NOTE — Telephone Encounter (Signed)
Per chart review tab pt is presently at [facility] ED. Sending note to Dr Darnell Level and G pool.

## 2022-11-16 NOTE — ED Provider Notes (Signed)
[facility] Provider Note    Event Date/Time   First MD Initiated Contact with Patient 11/16/22 1005     (approximate)   History   Chief Complaint Diarrhea   HPI Bianca Fry is a 77 y.o. female, history of hyperlipidemia, hypertension, ischemic colitis, CAD, irritable bowel syndrome, PAD, anxiety, GERD, presents to the emergency department for evaluation of diarrhea x 5 days.  She states that she was recently diagnosed with COVID-19 last week and treated with Paxlovid.  She states that since starting the Paxlovid, she has had several episodes of diarrhea, including a few episodes of bloody stool.  Reports some nausea and abdominal cramping.  The bloody stool as stopped over the past 2 days, but she still continues to have some diarrhea.  She says that her COVID symptoms are improving.  She is no longer taking the Paxlovid.  Denies fever/chills, chest pain, shortness of breath, flank pain, vomiting, rash/lesions, hematochezia, urinary symptoms, headache, weakness, vision changes, or dizziness/lightheadedness.  History Limitations: No limitations.        Physical Exam  Triage Vital Signs: ED Triage Vitals  Enc Vitals Group     BP 11/16/22 0928 (!) 180/81     Pulse Rate 11/16/22 0928 96     Resp 11/16/22 0947 16     Temp 11/16/22 0928 97.6 F (36.4 C)     Temp Source 11/16/22 0928 Oral     SpO2 11/16/22 0928 95 %     Weight 11/16/22 0939 145 lb 1 oz (65.8 kg)     Height 11/16/22 0939 '5\' 4"'$  (1.626 m)     Head Circumference --      Peak Flow --      Pain Score --      Pain Loc --      Pain Edu? --      Excl. in [place]? --     Most recent vital signs: Vitals:   11/16/22 0947 11/16/22 1424  BP:  (!) 150/80  Pulse:  94  Resp: 16 16  Temp:  97.7 F (36.5 C)  SpO2:  96%    General: Awake, NAD.  Skin: Warm, dry. No rashes or lesions.  Eyes: PERRL. Conjunctivae normal.  CV: Good peripheral perfusion.  Resp: Normal effort.  Abd: Soft, non-tender.  No distention.  Neuro: At baseline. No gross neurological deficits.  Musculoskeletal: Normal ROM of all extremities.  Focused Exam: Rectal examination is unremarkable.  No evidence of hemorrhoids, fissures, or rectal perforations.  No active bleeding or discharge noted at this time.  Physical Exam    ED Results / Procedures / Treatments  Labs (all labs ordered are listed, but only abnormal results are displayed) Labs Reviewed  COMPREHENSIVE METABOLIC PANEL - Abnormal; Notable for the following components:      Result Value   Potassium 3.3 (*)    Glucose, Bld 135 (*)    All other components within normal limits  URINALYSIS, ROUTINE W REFLEX MICROSCOPIC - Abnormal; Notable for the following components:   Color, Urine YELLOW (*)    APPearance TURBID (*)    Hgb urine dipstick SMALL (*)    Protein, ur 100 (*)    Leukocytes,Ua LARGE (*)    Bacteria, UA MANY (*)    All other components within normal limits  CBC  LIPASE, BLOOD     EKG N/A.    RADIOLOGY  ED Provider Interpretation: I personally reviewed and interpreted the CT scan, evidence suggestive of colitis and  cystitis.  CT ANGIO GI BLEED  Result Date: 11/16/2022 CLINICAL DATA:  Left lower quadrant abdominal pain. Diarrhea. History of ischemic colitis. Bloody stools. Evaluate source of lower GI bleed. EXAM: CTA ABDOMEN AND PELVIS WITHOUT AND WITH CONTRAST TECHNIQUE: Multidetector CT imaging of the abdomen and pelvis was performed using the standard protocol during bolus administration of intravenous contrast. Multiplanar reconstructed images and MIPs were obtained and reviewed to evaluate the vascular anatomy. RADIATION DOSE REDUCTION: This exam was performed according to the departmental dose-optimization program which includes automated exposure control, adjustment of the mA and/or kV according to patient size and/or use of iterative reconstruction technique. CONTRAST:  148m OMNIPAQUE IOHEXOL 350 MG/ML SOLN COMPARISON:   10/06/2022 FINDINGS: VASCULAR Aortoiliac calcified atherosclerotic disease is identified. The abdominal aorta has a normal caliber. The major arterial structures within the abdomen and pelvis appear patent. Proximal femoral arteries are patent. On the delayed images the portal venous system appears patent. Comparing the pre-and postcontrast images, no gastrointestinal contrast extravasation is identified. Review of the MIP images confirms the above findings. NON-VASCULAR Lower chest: No pleural fluid or airspace disease. Hepatobiliary: No enhancing liver lesions. Unchanged low-attenuation too small to characterize right lobe of liver hypodensity measures 6 mm, image 56/16 status post cholecystectomy pneumobilia is identified. Pancreas: Unremarkable. No pancreatic ductal dilatation or surrounding inflammatory changes. Spleen: Normal in size without focal abnormality. Adrenals/Urinary Tract: Normal adrenal glands. No nephrolithiasis or hydronephrosis. Bilateral renal cortical scarring with volume loss. Mild chronic bladder wall thickening with surrounding soft tissue stranding. Stomach/Bowel: No pathologic dilatation of the large or small bowel loops. There is diffuse circumferential wall thickening involving the colon. This is most severe at the level of the mid and distal sigmoid colon. There is surrounding hyperemia of the Vasa recta. Multiple distal colonic diverticula noted. No pathologic dilatation of the large or small bowel loops. The appendix is not visualized. Lymphatic: No signs of abdominopelvic adenopathy. Reproductive: Status post hysterectomy. No adnexal masses. Other: No significant free fluid or fluid collections. No signs of pneumoperitoneum. Musculoskeletal: Status post L4-5 PLIF. No acute or suspicious osseous findings. IMPRESSION: 1. No signs of gastrointestinal contrast extravasation to suggest active gastrointestinal hemorrhage. 2. Diffuse circumferential wall thickening involving the colon with  surrounding hyperemia of the Vasa recta. Findings are compatible with colitis. 3. Colonic diverticulosis without evidence for acute diverticulitis. 4. Chronic bladder wall thickening with surrounding soft tissue stranding. Correlate for any clinical signs or symptoms of cystitis. 5.  Aortic Atherosclerosis (ICD10-I70.0). Electronically Signed   By: TKerby MoorsM.D.   On: 11/16/2022 11:41    PROCEDURES:  Critical Care performed: A.  Procedures    MEDICATIONS ORDERED IN ED: Medications  lactated ringers bolus 1,000 mL (0 mLs Intravenous Stopped 11/16/22 1353)  potassium chloride SA (KLOR-CON M) CR tablet 20 mEq (20 mEq Oral Given 11/16/22 1105)  iohexol (OMNIPAQUE) 350 MG/ML injection 100 mL (100 mLs Intravenous Contrast Given 11/16/22 1112)     IMPRESSION / MDM / ASSESSMENT AND PLAN / ED COURSE  I reviewed the triage vital signs and the nursing notes.                              Differential diagnosis includes, but is not limited to, cystitis, Crohn's disease, ischemic colitis, inflammatory colitis, ulcerative colitis, upper GI bleed, hemorrhoids, diverticulitis, diverticulosis.  ED Course Patient appears well, vitals within normal limits.  NAD.  Given the history of several episodes of diarrhea, will  treat with IV lactated Ringer's.  CBC shows no leukocytosis or anemia.  CMP shows mild hypokalemia at 3.3.  Provide p.o. potassium supplementation.  No AKI or transaminitis.  Lipase unremarkable at 28.  Urinalysis shows small amounts of hemoglobin, as well as large leukocytes and many bacteria.  Suggestive of urinary tract infection.    Assessment/Plan Patient presents with diarrhea x 5 days, associated with bloody stools for the first 3 days.  She appears well clinically.  No significant physical exam findings.  Negative rectal exam.  No active bleeding at this time.  Her CBC does not show any evidence of anemia.  Lab workup overall reassuring.  Urinalysis does show evidence of  urinary tract infection, paired with findings on CT scan suggestive of cystitis.  Will provide her with a prescription for cephalexin.  In regards to her GI bleeding, her CT imaging did not show diffuse circumferential wall thickening involving the colon with surrounding hyperemia at the base of rectum, compatible with colitis.  I spoke with the on-call gastroenterologist, who advised that this is likely secondary to recent viral syndrome.  Highly unlikely to be related to ischemic colitis or bacterial etiology.  Given her overall unremarkable presentation, I believe this to be correct.  Provide her with a prescription for ondansetron.  Recommend that she follow-up with her gastroenterologist.  She expressed understanding and agreed with this plan.  Considered admission for this patient, but given her stable presentation and access to close follow-up, she is unlikely benefit from admission.  Provided the patient with anticipatory guidance, return precautions, and educational material. Encouraged the patient to return to the emergency department at any time if they begin to experience any new or worsening symptoms. Patient expressed understanding and agreed with the plan.   Patient's presentation is most consistent with acute complicated illness / injury requiring diagnostic workup.       FINAL CLINICAL IMPRESSION(S) / ED DIAGNOSES   Final diagnoses:  Diarrhea, unspecified type     Rx / DC Orders   ED Discharge Orders          Ordered    ondansetron (ZOFRAN-ODT) 4 MG disintegrating tablet  Every 8 hours PRN        11/16/22 1413    cephALEXin (KEFLEX) 500 MG capsule  2 times daily        11/16/22 1413             Note:  This document was prepared using Dragon voice recognition software and may include unintentional dictation errors.   Teodoro Spray, [place] 11/16/22 1642    Nathaniel Man, MD 11/17/22 [phone]

## 2022-11-16 NOTE — Telephone Encounter (Signed)
Noted. Currently being evaluated for bloody diarrhea and vomiting in setting of taking paxlovid for COVID. Known ischemic colitis.

## 2022-11-16 NOTE — Telephone Encounter (Signed)
Patient called and stated she was prescribed the plaxlovid and it is making her nausea and having diarrhea. Patient was sent to access nurse.

## 2022-11-16 NOTE — ED Notes (Signed)
Pt up to the restroom

## 2022-11-18 ENCOUNTER — Telehealth: Payer: Self-pay

## 2022-11-18 NOTE — Telephone Encounter (Signed)
     Patient  visit on [place]   at 3/4  Have you been able to follow up with your primary care physician? Yes   The patient was or was not able to obtain any needed medicine or equipment. Yes   Patient expresses understanding of discharge instructions and education provided has no other needs at this time. Yes      [facility] [phone] [address], [place], [place] [address] Phone: [phone] Email: [email or web address]

## 2022-11-23 ENCOUNTER — Ambulatory Visit (INDEPENDENT_AMBULATORY_CARE_PROVIDER_SITE_OTHER): Payer: PPO | Admitting: Family Medicine

## 2022-11-23 ENCOUNTER — Encounter: Payer: Self-pay | Admitting: Family Medicine

## 2022-11-23 VITALS — BP 146/80 | HR 89 | Temp 97.8°F | Ht 64.0 in | Wt 140.5 lb

## 2022-11-23 DIAGNOSIS — K5909 Other constipation: Secondary | ICD-10-CM | POA: Diagnosis not present

## 2022-11-23 DIAGNOSIS — K559 Vascular disorder of intestine, unspecified: Secondary | ICD-10-CM | POA: Diagnosis not present

## 2022-11-23 DIAGNOSIS — N390 Urinary tract infection, site not specified: Secondary | ICD-10-CM | POA: Diagnosis not present

## 2022-11-23 DIAGNOSIS — K529 Noninfective gastroenteritis and colitis, unspecified: Secondary | ICD-10-CM

## 2022-11-23 LAB — BASIC METABOLIC PANEL
BUN: 17 mg/dL (ref 6–23)
CO2: 26 mEq/L (ref 19–32)
Calcium: 9.7 mg/dL (ref 8.4–10.5)
Chloride: 103 mEq/L (ref 96–112)
Creatinine, Ser: 0.81 mg/dL (ref 0.40–1.20)
GFR: 69.77 mL/min (ref >60.00)
Glucose, Bld: 119 mg/dL — ABNORMAL HIGH (ref 70–99)
Potassium: 3.8 mEq/L (ref 3.5–5.1)
Sodium: 139 mEq/L (ref 135–145)

## 2022-11-23 NOTE — Assessment & Plan Note (Addendum)
ER records reviewed. ?ischemic colitis flare in setting of Paxlovid use for COVID vs COVID colitis vs paxlovid related side effect. Supportive measures reviewed, rec good daily fluid intake. Update if not improving each day.  Update BMP

## 2022-11-23 NOTE — Patient Instructions (Addendum)
Labs today  Push small sips of fluids throughout the day.  Continue zofran as needed for nausea.  Good to see you today Return as needed

## 2022-11-23 NOTE — Progress Notes (Signed)
Patient ID: BELLADONNA LUBINSKI, female    DOB: 1946/07/29, 77 y.o.   MRN: [id number]  This visit was conducted in person.  BP (!) 146/80   Pulse 89   Temp 97.8 F (36.6 C) (Temporal)   Ht '5\' 4"'$  (1.626 m)   Wt 140 lb 8 oz (63.7 kg)   LMP 10/20/1979 (Within Years)   SpO2 97%   BMI 24.12 kg/m    CC: 6 mo f/u visit  Subjective:   HPI: EVALYNN HANKINS is a 77 y.o. female presenting on 11/23/2022 for Medical Management of Chronic Issues (Here for 6 mo f/u. )   Recent COVID infection treated with paxlovid, complicated by bloody diarrhea s/p ER evaluation with labs and CT GI angio negative for bleed (diffuse circumferential wall thickening involving the colon with surrounding hyperemia of the Vasa recta - findings are compatible with colitis, as well as chronic bladder wall thickening with surrounding soft tissue stranding - eval for cystitis), thought due to exacerbation of colitis after viral infection but not related to ischemic colitis flare. Treated with zofran, keflex for UTI and IVF. UCx was not sent.   She has h/o ischemic colitis, as well as interstitial cystitis and recurrent UTIs followed by Dr Amalia Hailey urology. Previously on ppx keflex '250mg'$ . She doesn't currently have GI doctor - last saw Dr Fuller Plan 2016.   Since taking Paxlovid feels nauseated, weak, lightheaded, epigastric GI upset, no appetite. Difficulty staying hydrated, not eating much in the past 2 weeks. Last episode of diarrhea was Thursday. BM yesterday and today - starting to be formed stools.   She wonders of higher dose keflex '500mg'$  BID is causing nausea.   Prior to feeling ill, was doing well with regular formed bowel movement daily after taking gummy fiber supplement.      Relevant past medical, surgical, family and social history reviewed and updated as indicated. Interim medical history since our last visit reviewed. Allergies and medications reviewed and updated. Outpatient Medications Prior to Visit  Medication Sig  Dispense Refill   ALPRAZolam (XANAX) 0.5 MG tablet TAKE ONE TABLET BY MOUTH TWICE A DAY AS NEEDED FOR ANXIETY 40 tablet 0   aspirin EC 81 MG tablet Take 1 tablet (81 mg total) by mouth daily. Swallow whole.     atorvastatin (LIPITOR) 10 MG tablet TAKE ONE TABLET BY MOUTH ONE TIME DAILY 90 tablet 0   Calcium Carb-Cholecalciferol (CALCIUM-VITAMIN D) 600-400 MG-UNIT TABS Take 1 tablet by mouth daily.     carvedilol (COREG) 3.125 MG tablet Take 1 tablet (3.125 mg total) by mouth 2 (two) times daily with a meal. 60 tablet 3   cephALEXin (KEFLEX) 500 MG capsule Take 1 capsule (500 mg total) by mouth 2 (two) times daily for 7 days. 14 capsule 0   cholecalciferol (VITAMIN D) 25 MCG (1000 UNIT) tablet Take 1,000 Units by mouth daily.     conjugated estrogens (PREMARIN) vaginal cream Use 1/2 g vaginally and place a pea size amount to urethra two or three times per week as needed to maintain symptom relief. 30 g 2   Cyanocobalamin (B-12) 1000 MCG SUBL Place 1 tablet under the tongue daily.     dicyclomine (BENTYL) 10 MG capsule Take 1 capsule (10 mg total) by mouth 2 (two) times daily as needed for spasms. 90 capsule 1   EPINEPHrine 0.3 mg/0.3 mL IJ SOAJ injection Inject 0.3 mg into the muscle as needed for anaphylaxis. 1 each 1   fexofenadine (ALLEGRA) 180 MG  tablet Take 180 mg by mouth daily as needed for allergies.     losartan (COZAAR) 50 MG tablet TAKE ONE AND ONE-HALF TABLETS BY MOUTH ONE TIME DAILY 135 tablet 3   meloxicam (MOBIC) 7.5 MG tablet Take 7.5 mg by mouth daily.     omeprazole (PRILOSEC) 40 MG capsule TAKE ONE CAPSULE BY MOUTH ONE TIME DAILY 90 capsule 3   ondansetron (ZOFRAN-ODT) 4 MG disintegrating tablet Take 1 tablet (4 mg total) by mouth every 8 (eight) hours as needed for nausea or vomiting. 20 tablet 0   triamcinolone cream (KENALOG) 0.1 % SMARTSIG:1 sparingly Topical Twice Daily     valACYclovir (VALTREX) 500 MG tablet Take one tablet (500 mg) by mouth twice a day for 3 days as needed  for a genital outbreak.  Take 4 tablets (2000 mg) by mouth twice a day for 1 day as needed for an oral outbreak. 30 tablet 5   No facility-administered medications prior to visit.     Per HPI unless specifically indicated in ROS section below Review of Systems  Objective:  BP (!) 146/80   Pulse 89   Temp 97.8 F (36.6 C) (Temporal)   Ht '5\' 4"'$  (1.626 m)   Wt 140 lb 8 oz (63.7 kg)   LMP 10/20/1979 (Within Years)   SpO2 97%   BMI 24.12 kg/m   Wt Readings from Last 3 Encounters:  11/23/22 140 lb 8 oz (63.7 kg)  11/16/22 145 lb 1 oz (65.8 kg)  11/12/22 145 lb 2 oz (65.8 kg)      Physical Exam Vitals and nursing note reviewed.  Constitutional:      Appearance: Normal appearance. She is not ill-appearing.  HENT:     Head: Normocephalic and atraumatic.     Mouth/Throat:     Mouth: Mucous membranes are moist.     Pharynx: Oropharynx is clear. No oropharyngeal exudate or posterior oropharyngeal erythema.  Eyes:     Extraocular Movements: Extraocular movements intact.     Pupils: Pupils are equal, round, and reactive to light.  Cardiovascular:     Rate and Rhythm: Normal rate and regular rhythm.     Pulses: Normal pulses.     Heart sounds: Normal heart sounds. No murmur heard. Pulmonary:     Effort: Pulmonary effort is normal. No respiratory distress.     Breath sounds: Normal breath sounds. No wheezing, rhonchi or rales.  Abdominal:     General: Bowel sounds are normal. There is no distension.     Palpations: Abdomen is soft. There is no mass.     Tenderness: There is abdominal tenderness (mild-mod) in the epigastric area and suprapubic area. There is no guarding or rebound. Negative signs include Murphy's sign.     Hernia: No hernia is present.  Musculoskeletal:     Right lower leg: No edema.     Left lower leg: No edema.  Skin:    General: Skin is warm and dry.     Findings: No rash.  Neurological:     Mental Status: She is alert.  Psychiatric:        Mood and  Affect: Mood normal.        Behavior: Behavior normal.       Results for orders placed or performed during the hospital encounter of 11/16/22  Comprehensive metabolic panel  Result Value Ref Range   Sodium 138 135 - 145 mmol/L   Potassium 3.3 (L) 3.5 - 5.1 mmol/L   Chloride 103 98 -  111 mmol/L   CO2 24 22 - 32 mmol/L   Glucose, Bld 135 (H) 70 - 99 mg/dL   BUN 13 8 - 23 mg/dL   Creatinine, Ser 0.86 0.44 - 1.00 mg/dL   Calcium 9.6 8.9 - 10.3 mg/dL   Total Protein 7.1 6.5 - 8.1 g/dL   Albumin 3.7 3.5 - 5.0 g/dL   AST 19 15 - 41 U/L   ALT 12 0 - 44 U/L   Alkaline Phosphatase 81 38 - 126 U/L   Total Bilirubin 0.6 0.3 - 1.2 mg/dL   GFR, Estimated >60 >60 mL/min   Anion gap 11 5 - 15  CBC  Result Value Ref Range   WBC 7.3 4.0 - 10.5 K/uL   RBC 4.41 3.87 - 5.11 MIL/uL   Hemoglobin 12.5 12.0 - 15.0 g/dL   HCT 39.5 36.0 - 46.0 %   MCV 89.6 80.0 - 100.0 fL   MCH 28.3 26.0 - 34.0 pg   MCHC 31.6 30.0 - 36.0 g/dL   RDW 12.4 11.5 - 15.5 %   Platelets 159 150 - 400 K/uL   nRBC 0.0 0.0 - 0.2 %  Urinalysis, Routine w reflex microscopic -Urine, Clean Catch  Result Value Ref Range   Color, Urine YELLOW (A) YELLOW   APPearance TURBID (A) CLEAR   Specific Gravity, Urine 1.021 1.005 - 1.030   pH 5.0 5.0 - 8.0   Glucose, UA NEGATIVE NEGATIVE mg/dL   Hgb urine dipstick SMALL (A) NEGATIVE   Bilirubin Urine NEGATIVE NEGATIVE   Ketones, ur NEGATIVE NEGATIVE mg/dL   Protein, ur 100 (A) NEGATIVE mg/dL   Nitrite NEGATIVE NEGATIVE   Leukocytes,Ua LARGE (A) NEGATIVE   RBC / HPF 21-50 0 - 5 RBC/hpf   WBC, UA >50 0 - 5 WBC/hpf   Bacteria, UA MANY (A) NONE SEEN   Squamous Epithelial / HPF 6-10 0 - 5 /HPF   WBC Clumps PRESENT    Mucus PRESENT    Hyphae Yeast PRESENT   Lipase, blood  Result Value Ref Range   Lipase 28 11 - 51 U/L    Assessment & Plan:   Problem List Items Addressed This Visit     Recurrent UTI    UA abnormal at ER - treated with keflex '500mg'$  BID x7 days, has 2 more days  of treatment left.  UCx was not sent       Ischemic colitis [facility])    Planning to establish with [facility] this summer [facility])      Acute colitis - Primary    ER records reviewed. ?ischemic colitis flare in setting of Paxlovid use for COVID vs COVID colitis vs paxlovid related side effect. Supportive measures reviewed, rec good daily fluid intake. Update if not improving each day.  Update BMP      Relevant Orders   Basic metabolic panel   Chronic constipation    Prior to COVID she was doing well with PRN fiber gummy.         No orders of the defined types were placed in this encounter.   Orders Placed This Encounter  Procedures   Basic metabolic panel    Patient Instructions  Labs today  Push small sips of fluids throughout the day.  Continue zofran as needed for nausea.  Good to see you today Return as needed   Follow up plan: Return if symptoms worsen or fail to improve.  Ria Bush, MD

## 2022-11-23 NOTE — Assessment & Plan Note (Signed)
UA abnormal at ER - treated with keflex '500mg'$  BID x7 days, has 2 more days of treatment left.  UCx was not sent

## 2022-11-23 NOTE — Assessment & Plan Note (Signed)
Prior to [place] she was doing well with PRN fiber gummy.

## 2022-11-23 NOTE — Assessment & Plan Note (Signed)
Planning to establish with [facility] this summer [facility])

## 2022-11-26 DIAGNOSIS — R002 Palpitations: Secondary | ICD-10-CM | POA: Diagnosis not present

## 2022-12-08 DIAGNOSIS — H02052 Trichiasis without entropian right lower eyelid: Secondary | ICD-10-CM | POA: Diagnosis not present

## 2022-12-11 ENCOUNTER — Other Ambulatory Visit: Payer: Self-pay | Admitting: *Deleted

## 2022-12-11 ENCOUNTER — Ambulatory Visit: Payer: PPO

## 2022-12-11 ENCOUNTER — Ambulatory Visit (INDEPENDENT_AMBULATORY_CARE_PROVIDER_SITE_OTHER): Payer: PPO

## 2022-12-11 DIAGNOSIS — R0600 Dyspnea, unspecified: Secondary | ICD-10-CM

## 2022-12-11 DIAGNOSIS — I6522 Occlusion and stenosis of left carotid artery: Secondary | ICD-10-CM

## 2022-12-12 LAB — ECHOCARDIOGRAM COMPLETE
Area-P 1/2: 2.96 cm2
S' Lateral: 2.7 cm

## 2022-12-15 ENCOUNTER — Other Ambulatory Visit: Payer: Self-pay | Admitting: Family Medicine

## 2022-12-15 DIAGNOSIS — F419 Anxiety disorder, unspecified: Secondary | ICD-10-CM

## 2022-12-15 NOTE — Telephone Encounter (Signed)
Name of Medication: Alprazolam Name of Pharmacy: [facility] or Written Date and Quantity: 11/04/22, #40 Last Office Visit and Type: 11/23/22, 6 mo f/u Next Office Visit and Type: 05/31/23, CPE Last Controlled Substance Agreement Date: 04/16/17 Last UDS: 04/16/17

## 2022-12-16 ENCOUNTER — Encounter: Payer: Self-pay | Admitting: Cardiovascular Disease

## 2022-12-16 ENCOUNTER — Ambulatory Visit: Payer: PPO | Attending: Cardiovascular Disease | Admitting: Cardiovascular Disease

## 2022-12-16 VITALS — BP 130/58 | HR 78 | Ht 64.0 in | Wt 143.5 lb

## 2022-12-16 DIAGNOSIS — R079 Chest pain, unspecified: Secondary | ICD-10-CM

## 2022-12-16 DIAGNOSIS — I739 Peripheral vascular disease, unspecified: Secondary | ICD-10-CM

## 2022-12-16 DIAGNOSIS — I6522 Occlusion and stenosis of left carotid artery: Secondary | ICD-10-CM

## 2022-12-16 DIAGNOSIS — R0600 Dyspnea, unspecified: Secondary | ICD-10-CM

## 2022-12-16 DIAGNOSIS — I771 Stricture of artery: Secondary | ICD-10-CM | POA: Diagnosis not present

## 2022-12-16 DIAGNOSIS — I700 Atherosclerosis of aorta: Secondary | ICD-10-CM

## 2022-12-16 DIAGNOSIS — R002 Palpitations: Secondary | ICD-10-CM

## 2022-12-16 DIAGNOSIS — E785 Hyperlipidemia, unspecified: Secondary | ICD-10-CM

## 2022-12-16 DIAGNOSIS — I10 Essential (primary) hypertension: Secondary | ICD-10-CM

## 2022-12-16 MED ORDER — CARVEDILOL 6.25 MG PO TABS: 6.2500 mg | ORAL_TABLET | Freq: Two times a day (BID) | ORAL | 3 refills | Status: DC

## 2022-12-16 MED ORDER — ATORVASTATIN CALCIUM 10 MG PO TABS: 10.0000 mg | ORAL_TABLET | Freq: Every day | ORAL | 3 refills | Status: DC

## 2022-12-16 NOTE — Progress Notes (Signed)
Patient seen today in clinic.

## 2022-12-16 NOTE — Patient Instructions (Addendum)
Medication Instructions:  Your physician has recommended you make the following change in your medication:    Please increase the Carvedilol (Coreg) up to 6.25 mg twice a day  If you need a refill on your cardiac medications before your next appointment, please call your pharmacy.   Lab work: No new labs needed  Testing/Procedures: No new testing needed  Follow-Up: At [facility], you and your health needs are our priority.  As part of our continuing mission to provide you with exceptional heart care, we have created designated Provider Care Teams.  These Care Teams include your primary Cardiologist (physician) and Advanced Practice Providers (APPs -  Physician Assistants and Nurse Practitioners) who all work together to provide you with the care you need, when you need it.  You will need a follow up appointment in 12 months  Providers on your designated Care Team:   Murray Hodgkins, NP Christell Faith, PA-C Cadence Kathlen Mody, [place]  COVID-19 Vaccine Information can be found at: [email or web address] For questions related to vaccine distribution or appointments, please email [email or web address] or call [phone].
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
## 2022-12-17 MED ORDER — ALPRAZOLAM 0.5 MG PO TABS: 0.5000 mg | ORAL_TABLET | Freq: Two times a day (BID) | ORAL | 0 refills | Status: DC | PRN

## 2022-12-17 NOTE — Addendum Note (Signed)
Addended by: Ria Bush on: 12/17/2022 12:17 PM   Modules accepted: Orders

## 2022-12-17 NOTE — Telephone Encounter (Signed)
ERx 
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
## 2022-12-22 DIAGNOSIS — H02051 Trichiasis without entropian right upper eyelid: Secondary | ICD-10-CM | POA: Diagnosis not present

## 2022-12-22 DIAGNOSIS — Q100 Congenital ptosis: Secondary | ICD-10-CM | POA: Diagnosis not present

## 2022-12-22 DIAGNOSIS — H02052 Trichiasis without entropian right lower eyelid: Secondary | ICD-10-CM | POA: Diagnosis not present

## 2022-12-23 ENCOUNTER — Ambulatory Visit: Payer: PPO | Admitting: Obstetrics and Gynecology

## 2022-12-23 ENCOUNTER — Encounter: Payer: Self-pay | Admitting: Obstetrics and Gynecology

## 2022-12-23 VITALS — BP 136/74 | HR 78 | Ht 63.0 in | Wt 144.0 lb

## 2022-12-23 DIAGNOSIS — L989 Disorder of the skin and subcutaneous tissue, unspecified: Secondary | ICD-10-CM
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
## 2023-01-18 ENCOUNTER — Encounter: Payer: Self-pay | Admitting: Family Medicine

## 2023-01-18 ENCOUNTER — Ambulatory Visit (INDEPENDENT_AMBULATORY_CARE_PROVIDER_SITE_OTHER): Payer: PPO | Admitting: Family Medicine

## 2023-01-18 VITALS — BP 148/78 | HR 65 | Temp 96.8°F | Ht 63.0 in | Wt 145.0 lb

## 2023-01-18 DIAGNOSIS — M542 Cervicalgia: Secondary | ICD-10-CM

## 2023-01-18 MED ORDER — GABAPENTIN 300 MG PO CAPS: 300.0000 mg | ORAL_CAPSULE | Freq: Every day | ORAL | 1 refills | Status: DC

## 2023-01-18 MED ORDER — DEXAMETHASONE SODIUM PHOSPHATE 10 MG/ML IJ SOLN
10.0000 mg | Freq: Once | INTRAMUSCULAR | Status: AC
Start: 2023-01-18 — End: 2023-01-18
  Administered 2023-01-18: 10 mg via INTRAMUSCULAR

## 2023-01-18 MED ORDER — METHOCARBAMOL 500 MG PO TABS: 500.0000 mg | ORAL_TABLET | Freq: Three times a day (TID) | ORAL | 0 refills | Status: DC | PRN

## 2023-01-18 NOTE — Patient Instructions (Addendum)
I think pain is coming from arthritis of neck.  Steroid shot today.  Treat with robaxin muscle relaxant as well as gabapentin 300mg  at night. May continue meloxicam 7.5mg  for the rest of the week.  Gentle stretching of neck.  If not improved, let us know for PT course or referral to spine doctor that does shots.

## 2023-01-18 NOTE — Assessment & Plan Note (Signed)
Anticipate acute exacerbation of known chronic cervical degenerative disc disease as evidenced by xrays 10/2020.  Treat today with dexamethasone 10mg  IM x1.  Rx robaxin, gabapentin with sedation precautions.  Supportive measures reviewed - may continue tylenol, meloxicam, gentle stretching, ice/heat.  Discussed if not improved with above treatment, may return to see her neurosurgeon Dr Pieter Partridge Dawley to consider cervical neck injection or let us know for referral for PT course.  Pt agrees with plan.

## 2023-01-18 NOTE — Progress Notes (Signed)
Patient ID: Bianca Fry, female    DOB: March 25, 1946, 77 y.o.   MRN: [id number]  This visit was conducted in person.  BP (!) 148/78   Pulse 65   Temp (!) 96.8 F (36 C) (Temporal)   Ht 5\' 3"  (1.6 m)   Wt 145 lb (65.8 kg)   LMP 10/20/1979 (Within Years)   SpO2 98%   BMI 25.69 kg/m   BP Readings from Last 3 Encounters:  01/18/23 (!) 148/78  12/23/22 136/74  12/16/22 (!) 130/58   CC: neck pain  Subjective:   HPI: Bianca Fry is a 77 y.o. female presenting on 01/18/2023 for Neck Pain (Neck pain since Monday 03/25. Pain is in the center of the back of the neck and the left side. )   1 wk h/o neck pain with radiation down left side if neck.  No shooting pain down arm, no numbness or tingling or weakness down arm.  Denies inciting trauma/injury or falls.  She's been cutting wood and cleaning the house.  Has tried husband's gabapentin 300mg  with benefit as well as tylenol/ibuprofen and meloxicam. No h/o neck surgery.  Known h/o cervical spondylosis from xrays 2022 as per below.  Feels best in am, symptoms worsen as day progresses.     Relevant past medical, surgical, family and social history reviewed and updated as indicated. Interim medical history since our last visit reviewed. Allergies and medications reviewed and updated. Outpatient Medications Prior to Visit  Medication Sig Dispense Refill   ALPRAZolam (XANAX) 0.5 MG tablet Take 1 tablet (0.5 mg total) by mouth 2 (two) times daily as needed for anxiety. 40 tablet 0   aspirin EC 81 MG tablet Take 1 tablet (81 mg total) by mouth daily. Swallow whole.     atorvastatin (LIPITOR) 10 MG tablet Take 1 tablet (10 mg total) by mouth daily. 90 tablet 3   Calcium Carb-Cholecalciferol (CALCIUM-VITAMIN D) 600-400 MG-UNIT TABS Take 1 tablet by mouth daily.     carvedilol (COREG) 6.25 MG tablet Take 1 tablet (6.25 mg total) by mouth 2 (two) times daily with a meal. 180 tablet 3   cephALEXin (KEFLEX) 250 MG capsule Take 250 mg by mouth  daily.     cholecalciferol (VITAMIN D) 25 MCG (1000 UNIT) tablet Take 1,000 Units by mouth daily.     conjugated estrogens (PREMARIN) vaginal cream Use 1/2 g vaginally and place a pea size amount to urethra two or three times per week as needed to maintain symptom relief. 30 g 2   dicyclomine (BENTYL) 10 MG capsule Take 1 capsule (10 mg total) by mouth 2 (two) times daily as needed for spasms. 90 capsule 1   EPINEPHrine 0.3 mg/0.3 mL IJ SOAJ injection Inject 0.3 mg into the muscle as needed for anaphylaxis. 1 each 1   fexofenadine (ALLEGRA) 180 MG tablet Take 180 mg by mouth daily as needed for allergies.     losartan (COZAAR) 50 MG tablet TAKE ONE AND ONE-HALF TABLETS BY MOUTH ONE TIME DAILY 135 tablet 3   meloxicam (MOBIC) 7.5 MG tablet Take 7.5 mg by mouth daily.     omeprazole (PRILOSEC) 40 MG capsule TAKE ONE CAPSULE BY MOUTH ONE TIME DAILY 90 capsule 3   ondansetron (ZOFRAN-ODT) 4 MG disintegrating tablet Take 1 tablet (4 mg total) by mouth every 8 (eight) hours as needed for nausea or vomiting. 20 tablet 0   triamcinolone cream (KENALOG) 0.1 % SMARTSIG:1 sparingly Topical Twice Daily     valACYclovir (  VALTREX) 500 MG tablet Take one tablet (500 mg) by mouth twice a day for 3 days as needed for a genital outbreak.  Take 4 tablets (2000 mg) by mouth twice a day for 1 day as needed for an oral outbreak. 30 tablet 5   Cyanocobalamin (B-12) 1000 MCG SUBL Place 1 tablet under the tongue daily. (Patient not taking: Reported on 01/18/2023)     No facility-administered medications prior to visit.     Per HPI unless specifically indicated in ROS section below Review of Systems  Objective:  BP (!) 148/78   Pulse 65   Temp (!) 96.8 F (36 C) (Temporal)   Ht 5\' 3"  (1.6 m)   Wt 145 lb (65.8 kg)   LMP 10/20/1979 (Within Years)   SpO2 98%   BMI 25.69 kg/m   Wt Readings from Last 3 Encounters:  01/18/23 145 lb (65.8 kg)  12/23/22 144 lb (65.3 kg)  12/16/22 143 lb 8 oz (65.1 kg)      Physical  Exam Vitals and nursing note reviewed.  Constitutional:      Appearance: Normal appearance. She is not ill-appearing.  Neck:     Vascular: No carotid bruit.     Comments:  FROM cervical neck with reproducible pain to left neck with rightward lateral flexion Tender to palpation midline cervical spine at ~C5 and T1  Tender to palpation of paracervical mm L>R as well as trapezius mm L>R FROM at shoulders Musculoskeletal:     Cervical back: Normal range of motion and neck supple. Tenderness present. No rigidity.     Right lower leg: No edema.     Left lower leg: No edema.  Lymphadenopathy:     Cervical: No cervical adenopathy.  Skin:    General: Skin is warm and dry.     Findings: No rash.  Neurological:     Mental Status: She is alert.     Sensory: Sensation is intact.     Motor: Motor function is intact.     Coordination: Coordination is intact.     Gait: Gait is intact.     Deep Tendon Reflexes:     Reflex Scores:      Bicep reflexes are 2+ on the right side and 2+ on the left side.    Comments:  CN grossly intact 5/5 strength BUE Grip strength intact  Psychiatric:        Mood and Affect: Mood normal.        Behavior: Behavior normal.       CERVICAL SPINE - COMPLETE 4+ VIEW COMPARISON:  None. FINDINGS: Frontal, lateral, open-mouth odontoid, and bilateral oblique views were obtained. There is no appreciable fracture. There is 2 mm of retrolisthesis of C5 on C6. There is 2 mm of anterolisthesis of C7 on T1. No other spondylolisthesis. Prevertebral soft tissues and predental space regions are normal. There is severe disc space narrowing at C5-6. There is mild disc space narrowing at C6-7 and C7-T1. There are anterior osteophytes at C4, C5, and C6. There is facet hypertrophy with exit foraminal narrowing at C4-5, C5-6, and C6-7 on the left and at C5-6 and C6-7 on the right. Facet hypertrophy is most severe on the left at C5-6. No erosive change. Visualized lung apices  are clear. There is calcification in each carotid artery. IMPRESSION: Osteoarthritic change at several levels, most severe at C5-6. Areas of mild spondylolisthesis at C5-6 and C7-T1, felt to be secondary to underlying spondylosis. No fracture  Foci of calcification  in each carotid artery noted. Electronically Signed   By: Lowella Grip III M.D.   On: 11/01/2020 09:14  Lab Results  Component Value Date   CREATININE 0.81 11/23/2022   BUN 17 11/23/2022   NA 139 11/23/2022   K 3.8 11/23/2022   CL 103 11/23/2022   CO2 26 11/23/2022   Lab Results  Component Value Date   WBC 7.3 11/16/2022   HGB 12.5 11/16/2022   HCT 39.5 11/16/2022   MCV 89.6 11/16/2022   PLT 159 11/16/2022    Assessment & Plan:   Problem List Items Addressed This Visit     Neck pain on left side - Primary    Anticipate acute exacerbation of known chronic cervical degenerative disc disease as evidenced by xrays 10/2020.  Treat today with dexamethasone 10mg  IM x1.  Rx robaxin, gabapentin with sedation precautions.  Supportive measures reviewed - may continue tylenol, meloxicam, gentle stretching, ice/heat.  Discussed if not improved with above treatment, may return to see her neurosurgeon Dr Pieter Partridge Dawley to consider cervical neck injection or let us know for referral for PT course.  Pt agrees with plan.         Meds ordered this encounter  Medications   methocarbamol (ROBAXIN) 500 MG tablet    Sig: Take 1 tablet (500 mg total) by mouth 3 (three) times daily as needed for muscle spasms (sedation precautions).    Dispense:  30 tablet    Refill:  0   gabapentin (NEURONTIN) 300 MG capsule    Sig: Take 1 capsule (300 mg total) by mouth at bedtime.    Dispense:  30 capsule    Refill:  1   dexamethasone (DECADRON) injection 10 mg    No orders of the defined types were placed in this encounter.   Patient Instructions  I think pain is coming from arthritis of neck.  Steroid shot today.  Treat with  robaxin muscle relaxant as well as gabapentin 300mg  at night. May continue meloxicam 7.5mg  for the rest of the week.  Gentle stretching of neck.  If not improved, let us know for PT course or referral to spine doctor that does shots.   Follow up plan: No follow-ups on file.  Ria Bush, MD

## 2023-01-21 ENCOUNTER — Other Ambulatory Visit: Payer: Self-pay | Admitting: Family Medicine

## 2023-01-21 DIAGNOSIS — F419 Anxiety disorder, unspecified: Secondary | ICD-10-CM

## 2023-01-22 NOTE — Telephone Encounter (Signed)
Refill request alprazolam Last refill 12/17/22 #40 Last office visit 01/18/23

## 2023-01-26 NOTE — Telephone Encounter (Signed)
ERx 

## 2023-01-27 DIAGNOSIS — L309 Dermatitis, unspecified: Secondary | ICD-10-CM | POA: Diagnosis not present

## 2023-01-27 DIAGNOSIS — L650 Telogen effluvium: Secondary | ICD-10-CM | POA: Diagnosis not present

## 2023-01-27 DIAGNOSIS — Z85828 Personal history of other malignant neoplasm of skin: Secondary | ICD-10-CM | POA: Diagnosis not present

## 2023-01-27 DIAGNOSIS — L821 Other seborrheic keratosis: Secondary | ICD-10-CM | POA: Diagnosis not present

## 2023-02-04 DIAGNOSIS — Q100 Congenital ptosis: Secondary | ICD-10-CM | POA: Diagnosis not present

## 2023-02-04 DIAGNOSIS — H02051 Trichiasis without entropian right upper eyelid: Secondary | ICD-10-CM | POA: Diagnosis not present

## 2023-02-04 DIAGNOSIS — H02052 Trichiasis without entropian right lower eyelid: Secondary | ICD-10-CM | POA: Diagnosis not present

## 2023-02-15 ENCOUNTER — Other Ambulatory Visit: Payer: Self-pay | Admitting: Family Medicine

## 2023-02-17 NOTE — Telephone Encounter (Signed)
Looks like regular Zofran tab was d/c. However, Zofran ODT is on current med list.   Spoke with pt asking about Zofran rx. States Dr. Reece Agar has originally prescribed regular Zofran tab. But [facility] ED gave her the Zofran ODT. Pt requests refill for regular Zofran tab and the ODT can be removed from list.  Zofran ODT has been removed.   Zofran Last filled:  10/14/22, #20 Last OV:  01/18/23, neck pain Next OV:  05/31/23, CPE

## 2023-02-17 NOTE — Telephone Encounter (Signed)
ERx 

## 2023-02-23 DIAGNOSIS — M5416 Radiculopathy, lumbar region: Secondary | ICD-10-CM | POA: Diagnosis not present

## 2023-03-17 DIAGNOSIS — L821 Other seborrheic keratosis: Secondary | ICD-10-CM | POA: Diagnosis not present

## 2023-03-17 DIAGNOSIS — Z85828 Personal history of other malignant neoplasm of skin: Secondary | ICD-10-CM | POA: Diagnosis not present

## 2023-03-17 DIAGNOSIS — L304 Erythema intertrigo: Secondary | ICD-10-CM | POA: Diagnosis not present

## 2023-03-17 DIAGNOSIS — D485 Neoplasm of uncertain behavior of skin: Secondary | ICD-10-CM | POA: Diagnosis not present

## 2023-03-17 DIAGNOSIS — L570 Actinic keratosis: Secondary | ICD-10-CM | POA: Diagnosis not present

## 2023-03-17 DIAGNOSIS — D0471 Carcinoma in situ of skin of right lower limb, including hip: Secondary | ICD-10-CM | POA: Diagnosis not present

## 2023-03-17 DIAGNOSIS — D692 Other nonthrombocytopenic purpura: Secondary | ICD-10-CM | POA: Diagnosis not present

## 2023-03-17 DIAGNOSIS — L814 Other melanin hyperpigmentation: Secondary | ICD-10-CM | POA: Diagnosis not present

## 2023-03-24 DIAGNOSIS — M7061 Trochanteric bursitis, right hip: Secondary | ICD-10-CM | POA: Diagnosis not present

## 2023-03-25 ENCOUNTER — Other Ambulatory Visit: Payer: Self-pay | Admitting: Family Medicine

## 2023-03-25 DIAGNOSIS — F419 Anxiety disorder, unspecified: Secondary | ICD-10-CM

## 2023-03-25 NOTE — Telephone Encounter (Signed)
Name of Medication: Alprazolam Name of Pharmacy: [facility] Last Fill or Written Date and Quantity:  01/26/23, #40 Last Office Visit and Type: 01/18/23, neck pain Next Office Visit and Type: 03/31/23, buttock pain Last Controlled Substance Agreement Date: 04/16/17 Last UDS:  04/16/17  Zofran last filled:  02/17/23, #20

## 2023-03-28 NOTE — Telephone Encounter (Signed)
ERx 

## 2023-03-30 MED ORDER — ALPRAZOLAM 0.5 MG PO TABS
ORAL_TABLET | ORAL | 0 refills | Status: DC
Start: 2023-03-30 — End: 2023-05-13

## 2023-03-30 MED ORDER — ONDANSETRON HCL 4 MG PO TABS: ORAL_TABLET | ORAL | 0 refills | Status: DC

## 2023-03-30 NOTE — Addendum Note (Signed)
Addended by: Nanci Pina on: 03/30/2023 04:42 PM   Modules accepted: Orders

## 2023-03-30 NOTE — Telephone Encounter (Signed)
Bianca Fry from [facility] called in and stated that they didn't receive the refill for her Xanax. She was wanting to know if this could be sent back over. Thank you!

## 2023-03-30 NOTE — Telephone Encounter (Signed)
Spoke with [facility] asking if rxs received. They received alprazolam but not Zofran. I e-scribed Zofran while on phn. They were able to see it come through and will fill for pt.

## 2023-03-30 NOTE — Addendum Note (Signed)
Addended by: Eustaquio Boyden on: 03/30/2023 03:41 PM   Modules accepted: Orders

## 2023-03-30 NOTE — Telephone Encounter (Signed)
ERx again. Plz ensure they received.

## 2023-03-31 ENCOUNTER — Ambulatory Visit (INDEPENDENT_AMBULATORY_CARE_PROVIDER_SITE_OTHER): Payer: PPO | Admitting: Family Medicine

## 2023-03-31 ENCOUNTER — Encounter: Payer: Self-pay | Admitting: Family Medicine

## 2023-03-31 VITALS — BP 132/68 | HR 84 | Temp 97.4°F | Ht 63.0 in | Wt 139.2 lb

## 2023-03-31 DIAGNOSIS — R29898 Other symptoms and signs involving the musculoskeletal system: Secondary | ICD-10-CM | POA: Diagnosis not present

## 2023-03-31 DIAGNOSIS — M25551 Pain in right hip: Secondary | ICD-10-CM

## 2023-03-31 DIAGNOSIS — M533 Sacrococcygeal disorders, not elsewhere classified: Secondary | ICD-10-CM | POA: Diagnosis not present

## 2023-03-31 DIAGNOSIS — G8929 Other chronic pain: Secondary | ICD-10-CM | POA: Diagnosis not present

## 2023-03-31 NOTE — Progress Notes (Signed)
Ph: [phone] Fax: [phone]   Patient ID: Bianca Fry, female    DOB: [birth year], 77 y.o.   MRN: [id number]  This visit was conducted in person.  BP 132/68   Pulse 84   Temp (!) 97.4 F (36.3 C) (Temporal)   Ht 5\' 3"  (1.6 m)   Wt 139 lb 4 oz (63.2 kg)   LMP 10/20/1979 (Within Years)   SpO2 97%   BMI 24.67 kg/m    CC: pain to bottom  Subjective:   HPI: Bianca Fry is a 77 y.o. female presenting on 03/31/2023 for Mass (C/o skin at rectum. Area is painful when sitting. Seen for previously by Dr. Reece Agar and GYN. ) and Extremity Weakness (C/o B leg weakness and feels like they will give out. )   4 months of pain with sitting, worse with leaning back, pain located to tailbone. Initially started in evenings when sitting on sofa. Now becoming more constant pain whenever sitting.  Saw gynecologist - referred to dermatologist seen x2, recommended aquaphor nightly for irritation of skin.  More recently using neosporin  No fever.   Also notes episodes of bilateral leg weakness that can occur when active ie shopping. Weakness starts in bilateral anterior thighs and travels upwards, associated with nausea, no abd pain.  She is able to walk but feels generalized lower extremity weakness.  No fevers/chills. No bowel/bladder incontinence. No saddle anesthesia or radiculopathy   Known h/o diverticulosis and ischemic colitis.  Current symptoms are different than when she gets episodes of ischemic colitis.      Relevant past medical, surgical, family and social history reviewed and updated as indicated. Interim medical history since our last visit reviewed. Allergies and medications reviewed and updated. Outpatient Medications Prior to Visit  Medication Sig Dispense Refill   ALPRAZolam (XANAX) 0.5 MG tablet TAKE ONE TABLET BY MOUTH TWICE A DAY AS NEEDED FOR ANXIETY 40 tablet 0   aspirin EC 81 MG tablet Take 1 tablet (81 mg total) by mouth daily. Swallow whole.     atorvastatin  (LIPITOR) 10 MG tablet Take 1 tablet (10 mg total) by mouth daily. 90 tablet 3   Calcium Carb-Cholecalciferol (CALCIUM-VITAMIN D) 600-400 MG-UNIT TABS Take 1 tablet by mouth daily.     carvedilol (COREG) 6.25 MG tablet Take 1 tablet (6.25 mg total) by mouth 2 (two) times daily with a meal. 180 tablet 3   cephALEXin (KEFLEX) 250 MG capsule Take 250 mg by mouth daily.     cholecalciferol (VITAMIN D) 25 MCG (1000 UNIT) tablet Take 1,000 Units by mouth daily.     conjugated estrogens (PREMARIN) vaginal cream Use 1/2 g vaginally and place a pea size amount to urethra two or three times per week as needed to maintain symptom relief. 30 g 2   Cyanocobalamin (B-12) 1000 MCG SUBL Place 1 tablet under the tongue daily.     dicyclomine (BENTYL) 10 MG capsule Take 1 capsule (10 mg total) by mouth 2 (two) times daily as needed for spasms. 90 capsule 1   EPINEPHrine 0.3 mg/0.3 mL IJ SOAJ injection Inject 0.3 mg into the muscle as needed for anaphylaxis. 1 each 1   fexofenadine (ALLEGRA) 180 MG tablet Take 180 mg by mouth daily as needed for allergies.     gabapentin (NEURONTIN) 300 MG capsule Take 1 capsule (300 mg total) by mouth at bedtime. 30 capsule 1   losartan (COZAAR) 50 MG tablet TAKE ONE AND ONE-HALF TABLETS BY MOUTH ONE TIME DAILY  135 tablet 3   meloxicam (MOBIC) 7.5 MG tablet Take 7.5 mg by mouth daily.     methocarbamol (ROBAXIN) 500 MG tablet Take 1 tablet (500 mg total) by mouth 3 (three) times daily as needed for muscle spasms (sedation precautions). 30 tablet 0   omeprazole (PRILOSEC) 40 MG capsule TAKE ONE CAPSULE BY MOUTH ONE TIME DAILY 90 capsule 3   ondansetron (ZOFRAN) 4 MG tablet TAKE ONE TABLET BY MOUTH EVERY 8 HOURS AS NEEDED FOR NAUSEA AND VOMITING 20 tablet 0   triamcinolone cream (KENALOG) 0.1 % SMARTSIG:1 sparingly Topical Twice Daily     valACYclovir (VALTREX) 500 MG tablet Take one tablet (500 mg) by mouth twice a day for 3 days as needed for a genital outbreak.  Take 4 tablets (2000  mg) by mouth twice a day for 1 day as needed for an oral outbreak. 30 tablet 5   No facility-administered medications prior to visit.     Per HPI unless specifically indicated in ROS section below Review of Systems  Objective:  BP 132/68   Pulse 84   Temp (!) 97.4 F (36.3 C) (Temporal)   Ht 5\' 3"  (1.6 m)   Wt 139 lb 4 oz (63.2 kg)   LMP 10/20/1979 (Within Years)   SpO2 97%   BMI 24.67 kg/m   Wt Readings from Last 3 Encounters:  03/31/23 139 lb 4 oz (63.2 kg)  01/18/23 145 lb (65.8 kg)  12/23/22 144 lb (65.3 kg)      Physical Exam Vitals and nursing note reviewed.  Constitutional:      Appearance: Normal appearance. She is not ill-appearing.  Musculoskeletal:        General: Normal range of motion.     Comments:  No significant pain midline spine No paraspinous mm tenderness Neg SLR bilaterally. No pain with int/ext rotation at hip. + pain at R GTB No pain at SIJ, sciatic notch bilaterally.  Reproducible pain to palpation of coccyx   Skin:    General: Skin is warm and dry.     Findings: Erythema present. No rash.     Comments: Mild erythema to skin of sacral region  Neurological:     General: No focal deficit present.     Mental Status: She is alert.     Comments:  5/5 BLE Sensation intact  Psychiatric:        Mood and Affect: Mood normal.        Behavior: Behavior normal.       Results for orders placed or performed in visit on 12/11/22  ECHOCARDIOGRAM COMPLETE  Result Value Ref Range   S' Lateral 2.70 cm   Area-P 1/2 2.96 cm2   Est EF 60 - 65%     Assessment & Plan:   Problem List Items Addressed This Visit     Chronic right hip pain    Ongoing R lateral hip pain consistent with chronic trochanteric pain syndrome, has been to orthopedist and neurosurgeon. States steroid injection didn't help as expected.       Coccyx pain - Primary    4 months of coccygeal pain without inciting injury reproducible to palpation of coccyx.  She had similar  issue back in 12/2020 that improved on its own.  Reviewed importance of always sitting on cushioned surfaces, carrying pillow with her.  Update coccyx films.  No red flags.  If ongoing, consider pelvic floor PT referral vs ortho eval.  Tylenol for pain. Avoid NSAIDs in h/o ischemic colitis.  Relevant Orders   DG Sacrum/Coccyx   Leg weakness, bilateral    Subjective leg weakness that occurs intermittently while active.  Benign exam today without obvious focal weakness. Will monitor for now.         No orders of the defined types were placed in this encounter.   Orders Placed This Encounter  Procedures   DG Sacrum/Coccyx    Standing Status:   Future    Standing Expiration Date:   03/30/2024    Order Specific Question:   Reason for Exam (SYMPTOM  OR DIAGNOSIS REQUIRED)    Answer:   coccygeal pain x 4 months    Order Specific Question:   Preferred imaging location?    Answer:   [facility]    Patient Instructions  You have tailbone pain. Return at your convenience for xrays between 10am-12 or 2-4pm.  Use cushion to sit on. Let me know if you'd like referral to physical therapy or orthopedist evaluation.  Follow up plan: Return if symptoms worsen or fail to improve.  Eustaquio Boyden, MD

## 2023-03-31 NOTE — Assessment & Plan Note (Addendum)
4 months of coccygeal pain without inciting injury reproducible to palpation of coccyx.  She had similar issue back in 12/2020 that improved on its own.  Reviewed importance of always sitting on cushioned surfaces, carrying pillow with her.  Update coccyx films.  No red flags.  If ongoing, consider pelvic floor PT referral vs ortho eval.  Tylenol for pain. Avoid NSAIDs in h/o ischemic colitis.

## 2023-03-31 NOTE — Patient Instructions (Signed)
You have tailbone pain. Return at your convenience for xrays between 10am-12 or 2-4pm.  Use cushion to sit on. Let me know if you'd like referral to physical therapy or orthopedist evaluation.

## 2023-03-31 NOTE — Assessment & Plan Note (Signed)
Ongoing R lateral hip pain consistent with chronic trochanteric pain syndrome, has been to orthopedist and neurosurgeon. States steroid injection didn't help as expected.

## 2023-03-31 NOTE — Assessment & Plan Note (Signed)
Subjective leg weakness that occurs intermittently while active.  Benign exam today without obvious focal weakness. Will monitor for now.

## 2023-04-01 ENCOUNTER — Ambulatory Visit (INDEPENDENT_AMBULATORY_CARE_PROVIDER_SITE_OTHER)
Admission: RE | Admit: 2023-04-01 | Discharge: 2023-04-01 | Disposition: A | Discharge status: Home / Self Care | Payer: PPO | Source: Ambulatory Visit | Attending: Family Medicine | Admitting: Family Medicine

## 2023-04-01 DIAGNOSIS — M533 Sacrococcygeal disorders, not elsewhere classified: Secondary | ICD-10-CM | POA: Diagnosis not present

## 2023-04-08 ENCOUNTER — Telehealth: Payer: Self-pay

## 2023-04-08 ENCOUNTER — Other Ambulatory Visit (INDEPENDENT_AMBULATORY_CARE_PROVIDER_SITE_OTHER): Payer: PPO

## 2023-04-08 DIAGNOSIS — M533 Sacrococcygeal disorders, not elsewhere classified: Secondary | ICD-10-CM | POA: Diagnosis not present

## 2023-04-08 NOTE — Telephone Encounter (Signed)
Touched base with radiology - rec CT without contrast if no concern for infection.   Please notify pt that sacrum/coccyx xray returned possibly abnormal with thinning of distal sacral bone.  I'd like her to come in for labwork to ensure no signs of infection (CBC, ESR/CRP). Doesn't have to be fasting.  Pending lab results I will order pelvic CT scan for further evaluation of sacrum.

## 2023-04-08 NOTE — Telephone Encounter (Signed)
Call report received from [facility] radiology on Xray ordered by Dr Sharen Hones. Sending message to Dr. Milinda Antis in his absence.

## 2023-04-08 NOTE — Telephone Encounter (Signed)
Spoke with pt relaying results and Dr. Timoteo Expose message. Pt verbalizes understanding and scheduled lab visit today at 3:30.

## 2023-04-09 LAB — CBC WITH DIFFERENTIAL/PLATELET
Basophils Absolute: 0.1 10*3/uL (ref 0.0–0.1)
Basophils Relative: 1 % (ref 0.0–3.0)
Eosinophils Absolute: 0.1 10*3/uL (ref 0.0–0.7)
Eosinophils Relative: 0.8 % (ref 0.0–5.0)
HCT: 37 % (ref 36.0–46.0)
Hemoglobin: 11.9 g/dL — ABNORMAL LOW (ref 12.0–15.0)
Lymphocytes Relative: 32.8 % (ref 12.0–46.0)
Lymphs Abs: 3.3 10*3/uL (ref 0.7–4.0)
MCHC: 32.2 g/dL (ref 30.0–36.0)
MCV: 87 fl (ref 78.0–100.0)
Monocytes Absolute: 1 10*3/uL (ref 0.1–1.0)
Monocytes Relative: 9.7 % (ref 3.0–12.0)
Neutro Abs: 5.5 10*3/uL (ref 1.4–7.7)
Neutrophils Relative %: 55.7 % (ref 43.0–77.0)
Platelets: 175 10*3/uL (ref 150.0–400.0)
RBC: 4.25 Mil/uL (ref 3.87–5.11)
RDW: 15.1 % (ref 11.5–15.5)
WBC: 10 10*3/uL (ref 4.0–10.5)

## 2023-04-09 LAB — SEDIMENTATION RATE: Sed Rate: 34 mm/hr — ABNORMAL HIGH (ref 0–30)

## 2023-04-09 LAB — C-REACTIVE PROTEIN: CRP: <1 mg/dL (ref 0.5–20.0)

## 2023-04-19 ENCOUNTER — Other Ambulatory Visit: Payer: Self-pay | Admitting: Family Medicine

## 2023-04-19 ENCOUNTER — Encounter: Payer: Self-pay | Admitting: *Deleted

## 2023-04-19 DIAGNOSIS — R937 Abnormal findings on diagnostic imaging of other parts of musculoskeletal system: Secondary | ICD-10-CM | POA: Insufficient documentation

## 2023-04-19 DIAGNOSIS — M533 Sacrococcygeal disorders, not elsewhere classified: Secondary | ICD-10-CM

## 2023-04-23 ENCOUNTER — Ambulatory Visit
Admission: RE | Admit: 2023-04-23 | Discharge: 2023-04-23 | Disposition: A | Discharge status: Home / Self Care | Payer: PPO | Source: Ambulatory Visit | Attending: Family Medicine | Admitting: Family Medicine

## 2023-04-23 DIAGNOSIS — R9389 Abnormal findings on diagnostic imaging of other specified body structures: Secondary | ICD-10-CM | POA: Diagnosis not present

## 2023-04-23 DIAGNOSIS — R937 Abnormal findings on diagnostic imaging of other parts of musculoskeletal system: Secondary | ICD-10-CM

## 2023-04-23 DIAGNOSIS — M533 Sacrococcygeal disorders, not elsewhere classified: Secondary | ICD-10-CM

## 2023-05-12 ENCOUNTER — Other Ambulatory Visit: Payer: Self-pay | Admitting: Family Medicine

## 2023-05-12 DIAGNOSIS — R112 Nausea with vomiting, unspecified: Secondary | ICD-10-CM

## 2023-05-12 DIAGNOSIS — F419 Anxiety disorder, unspecified: Secondary | ICD-10-CM

## 2023-05-12 NOTE — Telephone Encounter (Signed)
Name of Medication:  Alprazolam Name of Pharmacy:  [facility] Last Fill or Written Date and Quantity:  03/30/23, #40 Last Office Visit and Type:  03/31/23, buttock pain Next Office Visit and Type:  05/31/23, CPE Last Controlled Substance Agreement Date:  04/16/17 Last UDS:  04/16/17   Zofran last filled:  03/30/23, #20

## 2023-05-13 DIAGNOSIS — H02052 Trichiasis without entropian right lower eyelid: Secondary | ICD-10-CM | POA: Diagnosis not present

## 2023-05-13 DIAGNOSIS — H02051 Trichiasis without entropian right upper eyelid: Secondary | ICD-10-CM | POA: Diagnosis not present

## 2023-05-13 DIAGNOSIS — Q100 Congenital ptosis: Secondary | ICD-10-CM | POA: Diagnosis not present

## 2023-05-13 NOTE — Telephone Encounter (Signed)
ERx 

## 2023-05-15 ENCOUNTER — Other Ambulatory Visit: Payer: Self-pay | Admitting: Family Medicine

## 2023-05-15 DIAGNOSIS — R7303 Prediabetes: Secondary | ICD-10-CM

## 2023-05-15 DIAGNOSIS — D509 Iron deficiency anemia, unspecified: Secondary | ICD-10-CM

## 2023-05-15 DIAGNOSIS — R29898 Other symptoms and signs involving the musculoskeletal system: Secondary | ICD-10-CM

## 2023-05-15 DIAGNOSIS — E538 Deficiency of other specified B group vitamins: Secondary | ICD-10-CM | POA: Insufficient documentation

## 2023-05-15 DIAGNOSIS — K559 Vascular disorder of intestine, unspecified: Secondary | ICD-10-CM

## 2023-05-15 DIAGNOSIS — E559 Vitamin D deficiency, unspecified: Secondary | ICD-10-CM

## 2023-05-15 DIAGNOSIS — E785 Hyperlipidemia, unspecified: Secondary | ICD-10-CM

## 2023-05-18 ENCOUNTER — Other Ambulatory Visit: Payer: PPO

## 2023-05-21 ENCOUNTER — Other Ambulatory Visit (INDEPENDENT_AMBULATORY_CARE_PROVIDER_SITE_OTHER): Payer: PPO

## 2023-05-21 DIAGNOSIS — D509 Iron deficiency anemia, unspecified: Secondary | ICD-10-CM

## 2023-05-21 DIAGNOSIS — K559 Vascular disorder of intestine, unspecified: Secondary | ICD-10-CM

## 2023-05-21 DIAGNOSIS — E538 Deficiency of other specified B group vitamins: Secondary | ICD-10-CM

## 2023-05-21 DIAGNOSIS — E785 Hyperlipidemia, unspecified: Secondary | ICD-10-CM | POA: Diagnosis not present

## 2023-05-21 DIAGNOSIS — E559 Vitamin D deficiency, unspecified: Secondary | ICD-10-CM

## 2023-05-21 DIAGNOSIS — R29898 Other symptoms and signs involving the musculoskeletal system: Secondary | ICD-10-CM | POA: Diagnosis not present

## 2023-05-21 DIAGNOSIS — R7303 Prediabetes: Secondary | ICD-10-CM

## 2023-05-21 LAB — COMPREHENSIVE METABOLIC PANEL
ALT: 10 U/L (ref 0–35)
AST: 13 U/L (ref 0–37)
Albumin: 3.9 g/dL (ref 3.5–5.2)
Alkaline Phosphatase: 70 U/L (ref 39–117)
BUN: 16 mg/dL (ref 6–23)
CO2: 25 mEq/L (ref 19–32)
Calcium: 9.7 mg/dL (ref 8.4–10.5)
Chloride: 107 mEq/L (ref 96–112)
Creatinine, Ser: 0.83 mg/dL (ref 0.40–1.20)
GFR: 67.53 mL/min (ref >60.00)
Glucose, Bld: 108 mg/dL — ABNORMAL HIGH (ref 70–99)
Potassium: 3.5 mEq/L (ref 3.5–5.1)
Sodium: 141 mEq/L (ref 135–145)
Total Bilirubin: 0.5 mg/dL (ref 0.2–1.2)
Total Protein: 6.2 g/dL (ref 6.0–8.3)

## 2023-05-21 LAB — IBC PANEL
Iron: 59 ug/dL (ref 42–145)
Saturation Ratios: 19 % — ABNORMAL LOW (ref 20.0–50.0)
TIBC: 310.8 ug/dL (ref 250.0–450.0)
Transferrin: 222 mg/dL (ref 212.0–360.0)

## 2023-05-21 LAB — CBC WITH DIFFERENTIAL/PLATELET
Basophils Absolute: 0 10*3/uL (ref 0.0–0.1)
Basophils Relative: 0.4 % (ref 0.0–3.0)
Eosinophils Absolute: 0.1 10*3/uL (ref 0.0–0.7)
Eosinophils Relative: 1.1 % (ref 0.0–5.0)
HCT: 33.9 % — ABNORMAL LOW (ref 36.0–46.0)
Hemoglobin: 11.1 g/dL — ABNORMAL LOW (ref 12.0–15.0)
Lymphocytes Relative: 31.9 % (ref 12.0–46.0)
Lymphs Abs: 2.4 10*3/uL (ref 0.7–4.0)
MCHC: 32.7 g/dL (ref 30.0–36.0)
MCV: 87.8 fl (ref 78.0–100.0)
Monocytes Absolute: 0.8 10*3/uL (ref 0.1–1.0)
Monocytes Relative: 10.7 % (ref 3.0–12.0)
Neutro Abs: 4.2 10*3/uL (ref 1.4–7.7)
Neutrophils Relative %: 55.9 % (ref 43.0–77.0)
Platelets: 154 10*3/uL (ref 150.0–400.0)
RBC: 3.86 Mil/uL — ABNORMAL LOW (ref 3.87–5.11)
RDW: 14.8 % (ref 11.5–15.5)
WBC: 7.5 10*3/uL (ref 4.0–10.5)

## 2023-05-21 LAB — HEMOGLOBIN A1C: Hgb A1c MFr Bld: 6.4 % (ref 4.6–6.5)

## 2023-05-21 LAB — LIPID PANEL
Cholesterol: 151 mg/dL (ref 0–200)
HDL: 57.2 mg/dL (ref >39.00)
LDL Cholesterol: 69 mg/dL (ref 0–99)
NonHDL: 93.74
Total CHOL/HDL Ratio: 3
Triglycerides: 122 mg/dL (ref 0.0–149.0)
VLDL: 24.4 mg/dL (ref 0.0–40.0)

## 2023-05-21 LAB — VITAMIN D 25 HYDROXY (VIT D DEFICIENCY, FRACTURES): VITD: 36.18 ng/mL (ref 30.00–100.00)

## 2023-05-21 LAB — VITAMIN B12: Vitamin B-12: 356 pg/mL (ref 211–911)

## 2023-05-21 LAB — FERRITIN: Ferritin: 27.1 ng/mL (ref 10.0–291.0)

## 2023-05-21 LAB — CK: Total CK: 46 U/L (ref 7–177)

## 2023-05-25 ENCOUNTER — Encounter: Payer: PPO | Admitting: Family Medicine
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
## 2023-05-31 ENCOUNTER — Encounter: Payer: Self-pay | Admitting: Family Medicine

## 2023-05-31 ENCOUNTER — Ambulatory Visit (INDEPENDENT_AMBULATORY_CARE_PROVIDER_SITE_OTHER): Payer: PPO

## 2023-05-31 ENCOUNTER — Ambulatory Visit: Payer: PPO | Admitting: Family Medicine

## 2023-05-31 VITALS — BP 136/70 | HR 79 | Temp 97.1°F | Ht 62.5 in | Wt 140.5 lb

## 2023-05-31 VITALS — Ht 64.0 in | Wt 141.0 lb

## 2023-05-31 DIAGNOSIS — Z0000 Encounter for general adult medical examination without abnormal findings: Secondary | ICD-10-CM

## 2023-05-31 DIAGNOSIS — Z1231 Encounter for screening mammogram for malignant neoplasm of breast: Secondary | ICD-10-CM | POA: Diagnosis not present

## 2023-05-31 DIAGNOSIS — E785 Hyperlipidemia, unspecified: Secondary | ICD-10-CM

## 2023-05-31 DIAGNOSIS — I25119 Atherosclerotic heart disease of native coronary artery with unspecified angina pectoris: Secondary | ICD-10-CM | POA: Diagnosis not present

## 2023-05-31 DIAGNOSIS — F4321 Adjustment disorder with depressed mood: Secondary | ICD-10-CM | POA: Diagnosis not present

## 2023-05-31 DIAGNOSIS — R7303 Prediabetes: Secondary | ICD-10-CM

## 2023-05-31 DIAGNOSIS — K559 Vascular disorder of intestine, unspecified: Secondary | ICD-10-CM

## 2023-05-31 DIAGNOSIS — N3946 Mixed incontinence: Secondary | ICD-10-CM

## 2023-05-31 DIAGNOSIS — D509 Iron deficiency anemia, unspecified: Secondary | ICD-10-CM | POA: Diagnosis not present

## 2023-05-31 DIAGNOSIS — I10 Essential (primary) hypertension: Secondary | ICD-10-CM | POA: Diagnosis not present

## 2023-05-31 DIAGNOSIS — M85832 Other specified disorders of bone density and structure, left forearm: Secondary | ICD-10-CM

## 2023-05-31 DIAGNOSIS — Z7189 Other specified counseling: Secondary | ICD-10-CM

## 2023-05-31 DIAGNOSIS — K582 Mixed irritable bowel syndrome: Secondary | ICD-10-CM

## 2023-05-31 DIAGNOSIS — K2100 Gastro-esophageal reflux disease with esophagitis, without bleeding: Secondary | ICD-10-CM | POA: Diagnosis not present

## 2023-05-31 DIAGNOSIS — I739 Peripheral vascular disease, unspecified: Secondary | ICD-10-CM

## 2023-05-31 DIAGNOSIS — M533 Sacrococcygeal disorders, not elsewhere classified: Secondary | ICD-10-CM

## 2023-05-31 DIAGNOSIS — E538 Deficiency of other specified B group vitamins: Secondary | ICD-10-CM

## 2023-05-31 DIAGNOSIS — R21 Rash and other nonspecific skin eruption: Secondary | ICD-10-CM

## 2023-05-31 DIAGNOSIS — N390 Urinary tract infection, site not specified: Secondary | ICD-10-CM | POA: Diagnosis not present

## 2023-05-31 DIAGNOSIS — E559 Vitamin D deficiency, unspecified: Secondary | ICD-10-CM

## 2023-05-31 MED ORDER — B-12 1000 MCG SL SUBL: 1.0000 | SUBLINGUAL_TABLET | SUBLINGUAL | Status: DC

## 2023-05-31 NOTE — Patient Instructions (Addendum)
Bianca Fry , Thank you for taking time to come for your Medicare Wellness Visit. I appreciate your ongoing commitment to your health goals. Please review the following plan we discussed and let me know if I can assist you in the future.   Referrals/Orders/Follow-Ups/Clinician Recommendations: Aim for 30 minutes of exercise or brisk walking, 6-8 glasses of water, and 5 servings of fruits and vegetables each day.   You have an order for:  []   2D Mammogram  [x]   3D Mammogram  []   Bone Density     Please call for appointment:  [facility] [address] [place], [place] [address] [phone]  [facility] [address] [place], [place] [address] [phone]  [facility] [address] [place], [place] [address] [phone]  [facility] [facility] [address] [place], [place] [address] [phone]  [facility] [address] [place], [place] [address] [phone]    Make sure to wear two-piece clothing.  No lotions, powders, or deodorants the day of the appointment. Make sure to bring picture ID and insurance card.  Bring list of medications you are currently taking including any supplements.   Schedule your [place] screening mammogram through MyChart!   Log into your MyChart account.  Go to 'Visit' (or 'Appointments' if on mobile App) --> Schedule an Appointment  Under 'Select a Reason for Visit' choose the Mammogram Screening option.  Complete the pre-visit questions and select the time and place that best fits your schedule.    This is a list of the screening recommended for you and due dates:  Health Maintenance  Topic Date Due   DTaP/Tdap/Td vaccine (3 - Td or Tdap) 04/08/2021   COVID-19 Vaccine (4 - 2023-24 season) 06/19/2022   Medicare Annual Wellness Visit  05/28/2023   Flu Shot  05/20/2023   Mammogram  10/01/2023    Pneumonia Vaccine  Completed   DEXA scan (bone density measurement)  Completed   Hepatitis C Screening  Completed   Zoster (Shingles) Vaccine  Completed   HPV Vaccine  Aged Out   Colon Cancer Screening  Discontinued    Advanced directives: (Declined) Advance directive discussed with you today. Even though you declined this today, please call our office should you change your mind, and we can give you the proper paperwork for you to fill out.  Next Medicare Annual Wellness Visit scheduled for next year: Yes  Preventive Care 78 Years and Older, Female Preventive care refers to lifestyle choices and visits with your health care provider that can promote health and wellness. What does preventive care include? A yearly physical exam. This is also called an annual well check. Dental exams once or twice a year. Routine eye exams. Ask your health care provider how often you should have your eyes checked. Personal lifestyle choices, including: Daily care of your teeth and gums. Regular physical activity. Eating a healthy diet. Avoiding tobacco and drug use. Limiting alcohol use. Practicing safe sex. Taking low-dose aspirin every day. Taking vitamin and mineral supplements as recommended by your health care provider. What happens during an annual well check? The services and screenings done by your health care provider during your annual well check will depend on your age, overall health, lifestyle risk factors, and family history of disease. Counseling  Your health care provider may ask you questions about your: Alcohol use. Tobacco use. Drug use. Emotional well-being.  Home and relationship well-being. Sexual activity. Eating habits. History of falls. Memory and ability to understand (cognition). Work and work Astronomer. Reproductive health. Screening  You may have the following tests or measurements: Height, weight, and BMI. Blood pressure. Lipid and cholesterol levels. These  may be checked every 5 years, or more frequently if you are over 10 years old. Skin check. Lung cancer screening. You may have this screening every year starting at age 78 if you have a 30-pack-year history of smoking and currently smoke or have quit within the past 15 years. Fecal occult blood test (FOBT) of the stool. You may have this test every year starting at age 78. Flexible sigmoidoscopy or colonoscopy. You may have a sigmoidoscopy every 5 years or a colonoscopy every 10 years starting at age 78. Hepatitis C blood test. Hepatitis B blood test. Sexually transmitted disease (STD) testing. Diabetes screening. This is done by checking your blood sugar (glucose) after you have not eaten for a while (fasting). You may have this done every 1-3 years. Bone density scan. This is done to screen for osteoporosis. You may have this done starting at age 78. Mammogram. This may be done every 1-2 years. Talk to your health care provider about how often you should have regular mammograms. Talk with your health care provider about your test results, treatment options, and if necessary, the need for more tests. Vaccines  Your health care provider may recommend certain vaccines, such as: Influenza vaccine. This is recommended every year. Tetanus, diphtheria, and acellular pertussis (Tdap, Td) vaccine. You may need a Td booster every 10 years. Zoster vaccine. You may need this after age 78. Pneumococcal 13-valent conjugate (PCV13) vaccine. One dose is recommended after age 78. Pneumococcal polysaccharide (PPSV23) vaccine. One dose is recommended after age 78. Talk to your health care provider about which screenings and vaccines you need and how often you need them. This information is not intended to replace advice given to you by your health care provider. Make sure you discuss any questions you have with your health care provider. Document Released: 11/01/2015 Document Revised: 06/24/2016 Document  Reviewed: 08/06/2015 Elsevier Interactive Patient Education  2017 [employer].  Fall Prevention in the Home Falls can cause injuries. They can happen to people of all ages. There are many things you can do to make your home safe and to help prevent falls. What can I do on the outside of my home? Regularly fix the edges of walkways and driveways and fix any cracks. Remove anything that might make you trip as you walk through a door, such as a raised step or threshold. Trim any bushes or trees on the path to your home. Use bright outdoor lighting. Clear any walking paths of anything that might make someone trip, such as rocks or tools. Regularly check to see if handrails are loose or broken. Make sure that both sides of any steps have handrails. Any raised decks and porches should have guardrails on the edges. Have any leaves, snow, or ice cleared regularly. Use sand or salt on walking paths during winter. Clean up any spills in your garage right away. This includes oil or grease spills. What can I do in the bathroom? Use night lights. Install grab bars by the toilet and in the tub and shower. Do not use towel bars as grab bars. Use non-skid mats or decals in the tub or shower. If you need to sit down in the shower, use a plastic, non-slip stool. Keep the  floor dry. Clean up any water that spills on the floor as soon as it happens. Remove soap buildup in the tub or shower regularly. Attach bath mats securely with double-sided non-slip rug tape. Do not have throw rugs and other things on the floor that can make you trip. What can I do in the bedroom? Use night lights. Make sure that you have a light by your bed that is easy to reach. Do not use any sheets or blankets that are too big for your bed. They should not hang down onto the floor. Have a firm chair that has side arms. You can use this for support while you get dressed. Do not have throw rugs and other things on the floor that can  make you trip. What can I do in the kitchen? Clean up any spills right away. Avoid walking on wet floors. Keep items that you use a lot in easy-to-reach places. If you need to reach something above you, use a strong step stool that has a grab bar. Keep electrical cords out of the way. Do not use floor polish or wax that makes floors slippery. If you must use wax, use non-skid floor wax. Do not have throw rugs and other things on the floor that can make you trip. What can I do with my stairs? Do not leave any items on the stairs. Make sure that there are handrails on both sides of the stairs and use them. Fix handrails that are broken or loose. Make sure that handrails are as long as the stairways. Check any carpeting to make sure that it is firmly attached to the stairs. Fix any carpet that is loose or worn. Avoid having throw rugs at the top or bottom of the stairs. If you do have throw rugs, attach them to the floor with carpet tape. Make sure that you have a light switch at the top of the stairs and the bottom of the stairs. If you do not have them, ask someone to add them for you. What else can I do to help prevent falls? Wear shoes that: Do not have high heels. Have rubber bottoms. Are comfortable and fit you well. Are closed at the toe. Do not wear sandals. If you use a stepladder: Make sure that it is fully opened. Do not climb a closed stepladder. Make sure that both sides of the stepladder are locked into place. Ask someone to hold it for you, if possible. Clearly mark and make sure that you can see: Any grab bars or handrails. First and last steps. Where the edge of each step is. Use tools that help you move around (mobility aids) if they are needed. These include: Canes. Walkers. Scooters. Crutches. Turn on the lights when you go into a dark area. Replace any light bulbs as soon as they burn out. Set up your furniture so you have a clear path. Avoid moving your furniture  around. If any of your floors are uneven, fix them. If there are any pets around you, be aware of where they are. Review your medicines with your doctor. Some medicines can make you feel dizzy. This can increase your chance of falling. Ask your doctor what other things that you can do to help prevent falls. This information is not intended to replace advice given to you by your health care provider. Make sure you discuss any questions you have with your health care provider. Document Released: 08/01/2009 Document Revised: 03/12/2016 Document Reviewed: 11/09/2014 Elsevier Interactive  Patient Education  2017 [employer].

## 2023-05-31 NOTE — Assessment & Plan Note (Signed)
Advanced directive discussion - Has not set up. Would want husband to be HCPOA. [place] with CPR and intubation for possibly reversible condition, but would not want prolonged life support if terminal condition. Packet previously provided. Encouraged she continue working on this.

## 2023-05-31 NOTE — Patient Instructions (Addendum)
Consider RSV through local pharmacy.  Start b12 over the counter 3 days a week.  Good to see you today Return as needed or in 6 months for follow up visit.

## 2023-05-31 NOTE — Progress Notes (Unsigned)
Ph: [phone] Fax: [phone]   Patient ID: Bianca Fry, female    DOB: 10-Apr-1946, 77 y.o.   MRN: [id number]  This visit was conducted in person.  BP 136/70   Pulse 79   Temp (!) 97.1 F (36.2 C) (Temporal)   Ht 5' 2.5" (1.588 m)   Wt 140 lb 8 oz (63.7 kg)   LMP 10/20/1979 (Within Years)   SpO2 96%   BMI 25.29 kg/m    CC: CPE Subjective:   HPI: Bianca Fry is a 77 y.o. female presenting on 05/31/2023 for Annual Exam (MCR prt 2 [AWV- 05/31/23].)   Saw health advisor earlier today for medicare wellness visit. Note reviewed.    No results found.  Flowsheet Row Clinical Support from 05/31/2023 in [facility] [place]  PHQ-2 Total Score 0          05/31/2023    8:28 AM 01/18/2023    7:59 AM 11/12/2022   11:22 AM 05/27/2022    8:21 AM 05/22/2022    2:31 PM  Fall Risk   Falls in the past year? 0 1 1 0 0  Number falls in past yr: 0 0 0 0   Injury with Fall? 0 0 0 0   Risk for fall due to : No Fall Risks History of fall(s) No Fall Risks No Fall Risks   Follow up Falls prevention discussed;Falls evaluation completed Falls evaluation completed  Falls evaluation completed   She did have a fall last Thanksgiving - stepped on loose brick in backyard, fortunately no significant injury.   Coccyx pain x months - xray abnormal (relative lucency of distal sacrum proximal to [place] joint, CT scan was reassuring - prior lucency seen was not reproduced - thought artifactual. She feels this is related to dry skin to that area - has been using aquaphor cream with limited benefit.   Chronic rUTI and atrophic vaginitis - sees urology Logan Bores) yearly on keflex daily. did not tolerate estrace or premarin creams. Also taking D-mannose with benefit. No keflex use in the past month.   H/o diverticulitis and ischemic colitis and IBS alternating D/C - overall better since stopping oral iron replacement. Does better with slo-release iron.   Notes recent ankle swelling.   She continues omeprazole 40mg  daily. Not taking meloxicam regularly  Lesion present for months to right lower leg - has upcoming dermatology appt tomorrow.  Ongoing anhedonia - poor response to wellbutrin when previously tried.  Preventative: COLONOSCOPY Date: 03/2015 mod diverticulosis with focal colitis [facility]) rpt 10 yrs  Colonoscopy 03/2021 - ischemic colitis [facility])  Well woman exam - yearly GYN Dr Conley Simmonds (04/2021). S/p hysterectomy 1985. On premarin cream.  [facility]  DEXA 01/2015 - T score -2.3 R hip, -1.6 spine DEXA 09/2019 -  T score -2.3 at R hip, -1.5 spine, increased hip fracture risk - tried fosamax which caused hip pain.  DEXA 10/2021 - T -1.9 L femur, -2.1 L forearm - did not meet criteria for FRAX score Continues calcium and vitamin D supplement. Encouraged regular weight bearing exercises.  Lung cancer screening - not eligible  Flu shot yearly  COVID vaccine - Pfizer 11/2019, 12/2019, booster 10/2020  Td 2004, Tdap 2012  Pneumovax 2010, Prevnar-13 2015, pnemovax 03/2016  zostavax - 07/2010  Shingrix - 01/2018, 04/2018  RSV - discussed, to consider  Advanced directive discussion - Has not set up. Would want husband to be HCPOA. Ok with  CPR and intubation for possibly reversible condition, but would not want prolonged life support if terminal condition. Packet previously provided. Encouraged she continue working on this.  Seat belt use discussed.  Sunscreen use discussed, no changing moles on skin, sees derm yearly - upcoming appt tomorrow  Non smoker  Alcohol - none  Dentist - Q6 mo  Eye exam - yearly  Bowel - chronic, irregular  Bladder - urge incontinence - doing well on daily ppx keflex    Daily caffeine Lives with husband and 1 dog and 3 cats Occupation: retired, was in Audiological scientist Activity: walking 20-30 min/day  Diet: good water, fruits/vegetables daily     Relevant past medical, surgical, family and social history reviewed and  updated as indicated. Interim medical history since our last visit reviewed. Allergies and medications reviewed and updated. Outpatient Medications Prior to Visit  Medication Sig Dispense Refill   ALPRAZolam (XANAX) 0.5 MG tablet TAKE ONE TABLET BY MOUTH TWICE A DAY AS NEEDED FOR ANXIETY 40 tablet 0   aspirin EC 81 MG tablet Take 1 tablet (81 mg total) by mouth daily. Swallow whole.     atorvastatin (LIPITOR) 10 MG tablet Take 1 tablet (10 mg total) by mouth daily. 90 tablet 3   Calcium Carb-Cholecalciferol (CALCIUM-VITAMIN D) 600-400 MG-UNIT TABS Take 1 tablet by mouth daily.     carvedilol (COREG) 6.25 MG tablet Take 1 tablet (6.25 mg total) by mouth 2 (two) times daily with a meal. 180 tablet 3   cephALEXin (KEFLEX) 250 MG capsule Take 250 mg by mouth daily.     cholecalciferol (VITAMIN D) 25 MCG (1000 UNIT) tablet Take 1,000 Units by mouth daily.     conjugated estrogens (PREMARIN) vaginal cream Use 1/2 g vaginally and place a pea size amount to urethra two or three times per week as needed to maintain symptom relief. 30 g 2   dicyclomine (BENTYL) 10 MG capsule Take 1 capsule (10 mg total) by mouth 2 (two) times daily as needed for spasms. 90 capsule 1   EPINEPHrine 0.3 mg/0.3 mL IJ SOAJ injection Inject 0.3 mg into the muscle as needed for anaphylaxis. 1 each 1   fexofenadine (ALLEGRA) 180 MG tablet Take 180 mg by mouth daily as needed for allergies.     gabapentin (NEURONTIN) 300 MG capsule Take 1 capsule (300 mg total) by mouth at bedtime. 30 capsule 1   losartan (COZAAR) 50 MG tablet TAKE ONE AND ONE-HALF TABLETS BY MOUTH ONE TIME DAILY 135 tablet 3   meloxicam (MOBIC) 7.5 MG tablet Take 7.5 mg by mouth daily.     methocarbamol (ROBAXIN) 500 MG tablet Take 1 tablet (500 mg total) by mouth 3 (three) times daily as needed for muscle spasms (sedation precautions). 30 tablet 0   omeprazole (PRILOSEC) 40 MG capsule TAKE ONE CAPSULE BY MOUTH ONE TIME DAILY 90 capsule 3   ondansetron (ZOFRAN) 4  MG tablet TAKE ONE TABLET BY MOUTH EVERY 8 HOURS AS NEEDED FOR NAUSEA AND VOMITING 20 tablet 0   triamcinolone cream (KENALOG) 0.1 % SMARTSIG:1 sparingly Topical Twice Daily     valACYclovir (VALTREX) 500 MG tablet Take one tablet (500 mg) by mouth twice a day for 3 days as needed for a genital outbreak.  Take 4 tablets (2000 mg) by mouth twice a day for 1 day as needed for an oral outbreak. 30 tablet 5   Cyanocobalamin (B-12) 1000 MCG SUBL Place 1 tablet under the tongue daily.     No facility-administered medications  prior to visit.     Per HPI unless specifically indicated in ROS section below Review of Systems  Constitutional:  Negative for activity change, appetite change, chills, fatigue, fever and unexpected weight change.  HENT:  Negative for hearing loss.   Eyes:  Negative for visual disturbance.  Respiratory:  Negative for cough, chest tightness, shortness of breath and wheezing.   Cardiovascular:  Positive for leg swelling. Negative for chest pain and palpitations.  Gastrointestinal:  Positive for abdominal pain, constipation, nausea and vomiting. Negative for abdominal distention, blood in stool and diarrhea.  Genitourinary:  Negative for difficulty urinating and hematuria.  Musculoskeletal:  Negative for arthralgias, myalgias and neck pain.  Skin:  Negative for rash.  Neurological:  Negative for dizziness, seizures, syncope and headaches.  Hematological:  Negative for adenopathy. Bruises/bleeds easily.  Psychiatric/Behavioral:  Negative for dysphoric mood. The patient is not nervous/anxious.        Anhedonia     Objective:  BP 136/70   Pulse 79   Temp (!) 97.1 F (36.2 C) (Temporal)   Ht 5' 2.5" (1.588 m)   Wt 140 lb 8 oz (63.7 kg)   LMP 10/20/1979 (Within Years)   SpO2 96%   BMI 25.29 kg/m   Wt Readings from Last 3 Encounters:  05/31/23 140 lb 8 oz (63.7 kg)  05/31/23 141 lb (64 kg)  03/31/23 139 lb 4 oz (63.2 kg)      Physical Exam Vitals and nursing note  reviewed.  Constitutional:      Appearance: Normal appearance. She is not ill-appearing.  HENT:     Head: Normocephalic and atraumatic.     Right Ear: Tympanic membrane, ear canal and external ear normal. There is no impacted cerumen.     Left Ear: Tympanic membrane, ear canal and external ear normal. There is no impacted cerumen.     Mouth/Throat:     Mouth: Mucous membranes are moist.     Pharynx: Oropharynx is clear. No oropharyngeal exudate or posterior oropharyngeal erythema.  Eyes:     General:        Right eye: No discharge.        Left eye: No discharge.     Extraocular Movements: Extraocular movements intact.     Conjunctiva/sclera: Conjunctivae normal.     Pupils: Pupils are equal, round, and reactive to light.  Neck:     Thyroid: No thyroid mass or thyromegaly.     Vascular: No carotid bruit.  Cardiovascular:     Rate and Rhythm: Normal rate and regular rhythm.     Pulses: Normal pulses.     Heart sounds: Normal heart sounds. No murmur heard. Pulmonary:     Effort: Pulmonary effort is normal. No respiratory distress.     Breath sounds: Normal breath sounds. No wheezing, rhonchi or rales.  Abdominal:     General: Bowel sounds are normal. There is no distension.     Palpations: Abdomen is soft. There is no mass.     Tenderness: There is no abdominal tenderness. There is no guarding or rebound.     Hernia: No hernia is present.  Musculoskeletal:     Cervical back: Normal range of motion and neck supple. No rigidity.     Right lower leg: No edema.     Left lower leg: No edema.  Lymphadenopathy:     Cervical: No cervical adenopathy.  Skin:    General: Skin is warm and dry.     Findings: No rash.  Neurological:     General: No focal deficit present.     Mental Status: She is alert. Mental status is at baseline.  Psychiatric:        Mood and Affect: Mood normal.        Behavior: Behavior normal.       Results for orders placed or performed in visit on 05/21/23   Vitamin B12  Result Value Ref Range   Vitamin B-12 356 211 - 911 pg/mL  CK  Result Value Ref Range   Total CK 46 7 - 177 U/L  VITAMIN D 25 Hydroxy (Vit-D Deficiency, Fractures)  Result Value Ref Range   VITD 36.18 30.00 - 100.00 ng/mL  Hemoglobin A1c  Result Value Ref Range   Hgb A1c MFr Bld 6.4 4.6 - 6.5 %  Ferritin  Result Value Ref Range   Ferritin 27.1 10.0 - 291.0 ng/mL  IBC panel  Result Value Ref Range   Iron 59 42 - 145 ug/dL   Transferrin 621.3 086.5 - 360.0 mg/dL   Saturation Ratios 78.4 (L) 20.0 - 50.0 %   TIBC 310.8 250.0 - 450.0 mcg/dL  CBC with Differential/Platelet  Result Value Ref Range   WBC 7.5 4.0 - 10.5 K/uL   RBC 3.86 (L) 3.87 - 5.11 Mil/uL   Hemoglobin 11.1 (L) 12.0 - 15.0 g/dL   HCT 69.6 (L) 29.5 - 28.4 %   MCV 87.8 78.0 - 100.0 fl   MCHC 32.7 30.0 - 36.0 g/dL   RDW 13.2 44.0 - 10.2 %   Platelets 154.0 150.0 - 400.0 K/uL   Neutrophils Relative % 55.9 43.0 - 77.0 %   Lymphocytes Relative 31.9 12.0 - 46.0 %   Monocytes Relative 10.7 3.0 - 12.0 %   Eosinophils Relative 1.1 0.0 - 5.0 %   Basophils Relative 0.4 0.0 - 3.0 %   Neutro Abs 4.2 1.4 - 7.7 K/uL   Lymphs Abs 2.4 0.7 - 4.0 K/uL   Monocytes Absolute 0.8 0.1 - 1.0 K/uL   Eosinophils Absolute 0.1 0.0 - 0.7 K/uL   Basophils Absolute 0.0 0.0 - 0.1 K/uL  Comprehensive metabolic panel  Result Value Ref Range   Sodium 141 135 - 145 mEq/L   Potassium 3.5 3.5 - 5.1 mEq/L   Chloride 107 96 - 112 mEq/L   CO2 25 19 - 32 mEq/L   Glucose, Bld 108 (H) 70 - 99 mg/dL   BUN 16 6 - 23 mg/dL   Creatinine, Ser 7.25 0.40 - 1.20 mg/dL   Total Bilirubin 0.5 0.2 - 1.2 mg/dL   Alkaline Phosphatase 70 39 - 117 U/L   AST 13 0 - 37 U/L   ALT 10 0 - 35 U/L   Total Protein 6.2 6.0 - 8.3 g/dL   Albumin 3.9 3.5 - 5.2 g/dL   GFR 36.64 >40.34 mL/min   Calcium 9.7 8.4 - 10.5 mg/dL  Lipid panel  Result Value Ref Range   Cholesterol 151 0 - 200 mg/dL   Triglycerides 742.5 0.0 - 149.0 mg/dL   HDL 95.63 >87.56 mg/dL    VLDL 43.3 0.0 - 29.5 mg/dL   LDL Cholesterol 69 0 - 99 mg/dL   Total CHOL/HDL Ratio 3    NonHDL 93.74     Assessment & Plan:   Problem List Items Addressed This Visit     Advanced care planning/counseling discussion (Chronic)    Advanced directive discussion - Has not set up. Would want husband to be HCPOA. Ok with CPR and intubation  for possibly reversible condition, but would not want prolonged life support if terminal condition. Packet previously provided. Encouraged she continue working on this.       Health maintenance examination - Primary (Chronic)    Preventative protocols reviewed and updated unless pt declined. Discussed healthy diet and lifestyle.         Meds ordered this encounter  Medications   Cyanocobalamin (B-12) 1000 MCG SUBL    Sig: Place 1 tablet under the tongue every Monday, Wednesday, and Friday.    No orders of the defined types were placed in this encounter.   Patient Instructions  Consider RSV through local pharmacy.  Start b12 over the counter 3 days a week.  Good to see you today Return as needed or in 6 months for follow up visit.   Follow up plan: Return in about 6 months (around 12/01/2023) for follow up visit.  Eustaquio Boyden, MD
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
## 2023-05-31 NOTE — Assessment & Plan Note (Signed)
Preventative protocols reviewed and updated unless pt declined. Discussed healthy diet and lifestyle.  

## 2023-06-01 DIAGNOSIS — L304 Erythema intertrigo: Secondary | ICD-10-CM | POA: Diagnosis not present

## 2023-06-01 DIAGNOSIS — D485 Neoplasm of uncertain behavior of skin: Secondary | ICD-10-CM | POA: Diagnosis not present

## 2023-06-01 DIAGNOSIS — Z85828 Personal history of other malignant neoplasm of skin: Secondary | ICD-10-CM | POA: Diagnosis not present

## 2023-06-01 DIAGNOSIS — C44722 Squamous cell carcinoma of skin of right lower limb, including hip: Secondary | ICD-10-CM | POA: Diagnosis not present

## 2023-06-01 DIAGNOSIS — L821 Other seborrheic keratosis: Secondary | ICD-10-CM | POA: Diagnosis not present

## 2023-06-01 MED ORDER — LOSARTAN POTASSIUM 50 MG PO TABS: 75.0000 mg | ORAL_TABLET | Freq: Every day | ORAL | 4 refills | Status: DC

## 2023-06-01 MED ORDER — DICYCLOMINE HCL 10 MG PO CAPS: 10.0000 mg | ORAL_CAPSULE | Freq: Two times a day (BID) | ORAL | 1 refills | Status: AC | PRN

## 2023-06-01 MED ORDER — GABAPENTIN 300 MG PO CAPS: 300.0000 mg | ORAL_CAPSULE | Freq: Every day | ORAL | 1 refills | Status: DC

## 2023-06-01 MED ORDER — OMEPRAZOLE 40 MG PO CPDR: 40.0000 mg | DELAYED_RELEASE_CAPSULE | Freq: Every day | ORAL | 4 refills | Status: DC

## 2023-06-01 NOTE — Assessment & Plan Note (Signed)
Followed by cardiology, on statin, aspirin.

## 2023-06-01 NOTE — Assessment & Plan Note (Signed)
Chronic, stable on current regimen.  

## 2023-06-01 NOTE — Assessment & Plan Note (Addendum)
Predominant urge incontinence, has seen uro.

## 2023-06-01 NOTE — Assessment & Plan Note (Signed)
Skin rash to sacral region - she has been treating with aquaphor with limited benefit.

## 2023-06-01 NOTE — Assessment & Plan Note (Addendum)
Has seen uro Evans on daily keflex prophylaxis - for the past month she's been using D-mannose with benefit, not needing antibiotic.  May return to [employer].

## 2023-06-01 NOTE — Assessment & Plan Note (Addendum)
H/o ischemic colitis.  Continue aspirin, statin.

## 2023-06-01 NOTE — Assessment & Plan Note (Signed)
Continue calcium vit D and regular weight bearing exercises

## 2023-06-01 NOTE — Assessment & Plan Note (Signed)
Ongoing pain, s/p reassuring imaging, thought to be dermatologic disorder - see below.

## 2023-06-01 NOTE — Assessment & Plan Note (Signed)
She continues omeprazole 40mg  daily.

## 2023-06-01 NOTE — Assessment & Plan Note (Signed)
Continue vit D supplementation

## 2023-06-01 NOTE — Assessment & Plan Note (Addendum)
Ongoing anhedonia. Continues xanax prn  Poor response to wellbutrin when previously tried.  Consider SNRI to help IBS as well.

## 2023-06-01 NOTE — Assessment & Plan Note (Addendum)
Stable period.  

## 2023-06-01 NOTE — Assessment & Plan Note (Signed)
Chronic, stable on atorvastatin 10mg  daily through cardiology - continue The 10-year ASCVD risk score (Arnett DK, et al., 2019) is: 31.7%   Values used to calculate the score:     Age: 77 years     Sex: Female     Is Non-Hispanic African American: No     Diabetic: No     Tobacco smoker: No     Systolic Blood Pressure: 136 mmHg     Is BP treated: Yes     HDL Cholesterol: 57.2 mg/dL     Total Cholesterol: 151 mg/dL

## 2023-06-01 NOTE — Assessment & Plan Note (Addendum)
Restart oral b12 replacement MWF.

## 2023-06-01 NOTE — Assessment & Plan Note (Signed)
Reviewed A1c trend, encouraged limiting added sugar in diet.

## 2023-06-01 NOTE — Assessment & Plan Note (Signed)
With alternating diarrhea/constipation. Continue PRN bentyl.  Consider SNRI.

## 2023-06-01 NOTE — Assessment & Plan Note (Addendum)
Iron levels normal, ferritin low, mild anemia present. She is now off oral iron and levels seem to be dropping - will continue to monitor.

## 2023-06-10 ENCOUNTER — Ambulatory Visit (INDEPENDENT_AMBULATORY_CARE_PROVIDER_SITE_OTHER): Payer: PPO | Admitting: Obstetrics and Gynecology

## 2023-06-10 ENCOUNTER — Encounter: Payer: Self-pay | Admitting: Obstetrics and Gynecology

## 2023-06-10 VITALS — BP 118/70 | HR 72 | Ht 64.0 in | Wt 141.0 lb

## 2023-06-10 DIAGNOSIS — M8580 Other specified disorders of bone density and structure, unspecified site: Secondary | ICD-10-CM | POA: Diagnosis not present

## 2023-06-10 DIAGNOSIS — N3010 Interstitial cystitis (chronic) without hematuria: Secondary | ICD-10-CM

## 2023-06-10 DIAGNOSIS — N3281 Overactive bladder: Secondary | ICD-10-CM

## 2023-06-10 DIAGNOSIS — N3000 Acute cystitis without hematuria: Secondary | ICD-10-CM | POA: Diagnosis not present

## 2023-06-10 DIAGNOSIS — Z9189 Other specified personal risk factors, not elsewhere classified: Secondary | ICD-10-CM | POA: Diagnosis not present

## 2023-06-10 DIAGNOSIS — Z780 Asymptomatic menopausal state: Secondary | ICD-10-CM | POA: Diagnosis not present

## 2023-06-10 DIAGNOSIS — B009 Herpesviral infection, unspecified: Secondary | ICD-10-CM

## 2023-06-10 DIAGNOSIS — L729 Follicular cyst of the skin and subcutaneous tissue, unspecified: Secondary | ICD-10-CM

## 2023-06-10 MED ORDER — PREMARIN 0.625 MG/GM VA CREA: TOPICAL_CREAM | VAGINAL | 2 refills | Status: DC

## 2023-06-10 MED ORDER — VALACYCLOVIR HCL 500 MG PO TABS: ORAL_TABLET | ORAL | 1 refills | Status: DC
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
## 2023-06-10 NOTE — Patient Instructions (Addendum)
I recommend you take Keflex 500 mg (take 2 capsules of 250 mg each) by mouth twice a day for 7 days.

## 2023-06-12 LAB — URINALYSIS, COMPLETE W/RFL CULTURE
Bilirubin Urine: NEGATIVE
Glucose, UA: NEGATIVE
Hyaline Cast: NONE SEEN /LPF
Nitrites, Initial: POSITIVE — AB
Specific Gravity, Urine: 1.02 (ref 1.001–1.035)
pH: 6 (ref 5.0–8.0)

## 2023-06-12 LAB — URINE CULTURE
MICRO NUMBER:: 15367847
SPECIMEN QUALITY:: ADEQUATE

## 2023-06-12 LAB — CULTURE INDICATED

## 2023-06-14 DIAGNOSIS — M25551 Pain in right hip: Secondary | ICD-10-CM | POA: Diagnosis not present

## 2023-06-14 DIAGNOSIS — G8929 Other chronic pain: Secondary | ICD-10-CM | POA: Diagnosis not present

## 2023-06-17 ENCOUNTER — Other Ambulatory Visit: Payer: Self-pay | Admitting: Physician Assistant

## 2023-06-17 DIAGNOSIS — G8929 Other chronic pain: Secondary | ICD-10-CM

## 2023-06-18 ENCOUNTER — Ambulatory Visit
Admission: RE | Admit: 2023-06-18 | Discharge: 2023-06-18 | Disposition: A | Discharge status: Home / Self Care | Payer: PPO | Source: Ambulatory Visit | Attending: Physician Assistant | Admitting: Physician Assistant

## 2023-06-18 DIAGNOSIS — M25551 Pain in right hip: Secondary | ICD-10-CM | POA: Insufficient documentation

## 2023-06-18 DIAGNOSIS — G8929 Other chronic pain: Secondary | ICD-10-CM | POA: Diagnosis not present

## 2023-06-18 DIAGNOSIS — M160 Bilateral primary osteoarthritis of hip: Secondary | ICD-10-CM | POA: Diagnosis not present

## 2023-06-22 DIAGNOSIS — H02052 Trichiasis without entropian right lower eyelid: Secondary | ICD-10-CM | POA: Diagnosis not present

## 2023-06-22 DIAGNOSIS — Q100 Congenital ptosis: Secondary | ICD-10-CM | POA: Diagnosis not present

## 2023-06-22 DIAGNOSIS — H02051 Trichiasis without entropian right upper eyelid: Secondary | ICD-10-CM | POA: Diagnosis not present

## 2023-06-23 ENCOUNTER — Ambulatory Visit (INDEPENDENT_AMBULATORY_CARE_PROVIDER_SITE_OTHER): Payer: PPO | Admitting: Family Medicine

## 2023-06-23 ENCOUNTER — Encounter: Payer: Self-pay | Admitting: Family Medicine

## 2023-06-23 VITALS — BP 124/62 | HR 80 | Temp 97.2°F | Ht 64.0 in | Wt 141.0 lb

## 2023-06-23 DIAGNOSIS — R21 Rash and other nonspecific skin eruption: Secondary | ICD-10-CM

## 2023-06-23 DIAGNOSIS — R112 Nausea with vomiting, unspecified: Secondary | ICD-10-CM

## 2023-06-23 DIAGNOSIS — Z23 Encounter for immunization: Secondary | ICD-10-CM | POA: Diagnosis not present

## 2023-06-23 DIAGNOSIS — F419 Anxiety disorder, unspecified: Secondary | ICD-10-CM | POA: Diagnosis not present

## 2023-06-23 MED ORDER — ALPRAZOLAM 0.5 MG PO TABS: ORAL_TABLET | ORAL | 0 refills | Status: DC

## 2023-06-23 MED ORDER — ONDANSETRON HCL 4 MG PO TABS: ORAL_TABLET | ORAL | 0 refills | Status: DC

## 2023-06-23 NOTE — Assessment & Plan Note (Addendum)
Irritated dermatitis with 2 painful bumps to sacral region at superior buttock crease. Very superficial rash, no evidence of deeper pilonidal cyst. No signs of ongoing infection.a Will place hydrocolloid dressing to area for skin protection. Recommend derm f/u.

## 2023-06-23 NOTE — Progress Notes (Signed)
Ph: [phone] Fax: [phone]   Patient ID: Bianca Fry, female    DOB: 07/20/1946, 77 y.o.   MRN: [id number]  This visit was conducted in person.  BP 124/62   Pulse 80   Temp (!) 97.2 F (36.2 C) (Temporal)   Ht 5\' 4"  (1.626 m)   Wt 141 lb (64 kg)   LMP 10/20/1979 (Within Years)   SpO2 98%   BMI 24.20 kg/m    CC: check sacral region Subjective:   HPI: Bianca Fry is a 77 y.o. female presenting on 06/23/2023 for Mass (C/o bump at top of separation of buttocks. Seen for previously by Dr Reece Agar and GYN- who suggests a cyst. Area is still painful to sit.  )   Coccyx pain since February - she feels this is related to dry skin to that area - has been using aquaphor cream and Zeasorb antifungal powder with limited benefit. Has seen derm and GYN. No significant change with this.   GYN thought it may be a skin cyst.  Skin remains dry.  She continues to use aquaphor.  Area still painful to sit on.      Relevant past medical, surgical, family and social history reviewed and updated as indicated. Interim medical history since our last visit reviewed. Allergies and medications reviewed and updated. Outpatient Medications Prior to Visit  Medication Sig Dispense Refill   aspirin EC 81 MG tablet Take 1 tablet (81 mg total) by mouth daily. Swallow whole.     atorvastatin (LIPITOR) 10 MG tablet Take 1 tablet (10 mg total) by mouth daily. 90 tablet 3   Calcium Carb-Cholecalciferol (CALCIUM-VITAMIN D) 600-400 MG-UNIT TABS Take 1 tablet by mouth daily.     carvedilol (COREG) 6.25 MG tablet Take 1 tablet (6.25 mg total) by mouth 2 (two) times daily with a meal. 180 tablet 3   cephALEXin (KEFLEX) 250 MG capsule Take 250 mg by mouth daily.     cholecalciferol (VITAMIN D) 25 MCG (1000 UNIT) tablet Take 1,000 Units by mouth daily.     conjugated estrogens (PREMARIN) vaginal cream Use 1/2 g vaginally and place a pea size amount to urethra two or three times per week as needed to maintain  symptom relief. 30 g 2   Cyanocobalamin (B-12) 1000 MCG SUBL Place 1 tablet under the tongue every Monday, Wednesday, and Friday.     dicyclomine (BENTYL) 10 MG capsule Take 1 capsule (10 mg total) by mouth 2 (two) times daily as needed for spasms. 90 capsule 1   EPINEPHrine 0.3 mg/0.3 mL IJ SOAJ injection Inject 0.3 mg into the muscle as needed for anaphylaxis. 1 each 1   fexofenadine (ALLEGRA) 180 MG tablet Take 180 mg by mouth daily as needed for allergies.     gabapentin (NEURONTIN) 300 MG capsule Take 1 capsule (300 mg total) by mouth at bedtime. 30 capsule 1   losartan (COZAAR) 50 MG tablet Take 1.5 tablets (75 mg total) by mouth daily. 135 tablet 4   meloxicam (MOBIC) 7.5 MG tablet Take 7.5 mg by mouth daily.     methocarbamol (ROBAXIN) 500 MG tablet Take 1 tablet (500 mg total) by mouth 3 (three) times daily as needed for muscle spasms (sedation precautions). 30 tablet 0   omeprazole (PRILOSEC) 40 MG capsule Take 1 capsule (40 mg total) by mouth daily. 90 capsule 4   triamcinolone cream (KENALOG) 0.1 % SMARTSIG:1 sparingly Topical Twice Daily     valACYclovir (VALTREX) 500 MG tablet Take one  tablet (500 mg) by mouth twice a day for 3 days as needed for a genital outbreak.  Take 4 tablets (2000 mg) by mouth twice a day for 1 day as needed for an oral outbreak. 30 tablet 1   ALPRAZolam (XANAX) 0.5 MG tablet TAKE ONE TABLET BY MOUTH TWICE A DAY AS NEEDED FOR ANXIETY 40 tablet 0   ondansetron (ZOFRAN) 4 MG tablet TAKE ONE TABLET BY MOUTH EVERY 8 HOURS AS NEEDED FOR NAUSEA AND VOMITING 20 tablet 0   No facility-administered medications prior to visit.     Per HPI unless specifically indicated in ROS section below Review of Systems  Objective:  BP 124/62   Pulse 80   Temp (!) 97.2 F (36.2 C) (Temporal)   Ht 5\' 4"  (1.626 m)   Wt 141 lb (64 kg)   LMP 10/20/1979 (Within Years)   SpO2 98%   BMI 24.20 kg/m   Wt Readings from Last 3 Encounters:  06/23/23 141 lb (64 kg)  06/10/23 141 lb  (64 kg)  05/31/23 140 lb 8 oz (63.7 kg)      Physical Exam Vitals and nursing note reviewed.  Constitutional:      Appearance: Normal appearance. She is not ill-appearing.  Skin:    General: Skin is warm and dry.     Findings: Erythema and rash present.          Comments: 2 small bumps at superior buttock/sacral region with surrounding erythema  Neurological:     Mental Status: She is alert.       Assessment & Plan:   Problem List Items Addressed This Visit     Skin rash - Primary    Irritated dermatitis with 2 painful bumps to sacral region at superior buttock crease. Very superficial rash, no evidence of deeper pilonidal cyst. No signs of ongoing infection.a Will place hydrocolloid dressing to area for skin protection. Recommend derm f/u.       Anxiety    Refill PRN xanax.      Relevant Medications   ALPRAZolam (XANAX) 0.5 MG tablet   Other Visit Diagnoses     Encounter for immunization       Relevant Orders   Flu Vaccine Trivalent High Dose (Fluad) (Completed)   Nausea and vomiting, unspecified vomiting type       Relevant Medications   ondansetron (ZOFRAN) 4 MG tablet        Meds ordered this encounter  Medications   ALPRAZolam (XANAX) 0.5 MG tablet    Sig: TAKE ONE TABLET BY MOUTH TWICE A DAY AS NEEDED FOR ANXIETY    Dispense:  40 tablet    Refill:  0   ondansetron (ZOFRAN) 4 MG tablet    Sig: TAKE ONE TABLET BY MOUTH EVERY 8 HOURS AS NEEDED FOR NAUSEA AND VOMITING    Dispense:  20 tablet    Refill:  0    Orders Placed This Encounter  Procedures   Flu Vaccine Trivalent High Dose (Fluad)    Patient Instructions  Flu shot today  Try hydrocolloid dressing 1-2 times a week for extra protection to skin at sacrum.  Schedule follow up with dermatology for re evaluation.   Follow up plan: Return if symptoms worsen or fail to improve.  Eustaquio Boyden, MD

## 2023-06-23 NOTE — Patient Instructions (Addendum)
Flu shot today  Try hydrocolloid dressing 1-2 times a week for extra protection to skin at sacrum.  Schedule follow up with dermatology for re evaluation.

## 2023-06-23 NOTE — Assessment & Plan Note (Signed)
Refill PRN xanax.

## 2023-06-24 ENCOUNTER — Ambulatory Visit: Payer: PPO | Admitting: Obstetrics and Gynecology

## 2023-06-24 ENCOUNTER — Encounter: Payer: Self-pay | Admitting: Obstetrics and Gynecology

## 2023-06-24 VITALS — BP 114/60 | HR 73 | Ht 64.0 in | Wt 141.0 lb

## 2023-06-24 DIAGNOSIS — M545 Low back pain, unspecified: Secondary | ICD-10-CM | POA: Diagnosis not present

## 2023-06-24 DIAGNOSIS — R102 Pelvic and perineal pain: Secondary | ICD-10-CM | POA: Diagnosis not present

## 2023-06-24 DIAGNOSIS — G8929 Other chronic pain: Secondary | ICD-10-CM

## 2023-06-24 MED ORDER — ESTRADIOL 0.1 MG/GM VA CREA: TOPICAL_CREAM | VAGINAL | 2 refills | Status: DC

## 2023-07-13 DIAGNOSIS — L309 Dermatitis, unspecified: Secondary | ICD-10-CM | POA: Diagnosis not present

## 2023-07-13 DIAGNOSIS — H61001 Unspecified perichondritis of right external ear: Secondary | ICD-10-CM | POA: Diagnosis not present

## 2023-07-13 DIAGNOSIS — L821 Other seborrheic keratosis: Secondary | ICD-10-CM | POA: Diagnosis not present

## 2023-07-13 DIAGNOSIS — Z85828 Personal history of other malignant neoplasm of skin: Secondary | ICD-10-CM | POA: Diagnosis not present

## 2023-07-29 DIAGNOSIS — M1611 Unilateral primary osteoarthritis, right hip: Secondary | ICD-10-CM | POA: Diagnosis not present

## 2023-08-11 ENCOUNTER — Other Ambulatory Visit: Payer: Self-pay

## 2023-08-11 DIAGNOSIS — F419 Anxiety disorder, unspecified: Secondary | ICD-10-CM

## 2023-08-11 DIAGNOSIS — R112 Nausea with vomiting, unspecified: Secondary | ICD-10-CM

## 2023-08-11 NOTE — Telephone Encounter (Signed)
Name of Medication:  Alprazolam Name of Pharmacy:  [facility] Last Fill or Written Date and Quantity:  06/23/23, #40 Last Office Visit and Type:  06/23/23, skin rash Next Office Visit and Type:  12/01/23, 6 mo f/u Last Controlled Substance Agreement Date:  04/16/17 Last UDS:  04/16/17   Zofran last rx:  06/23/23, #20

## 2023-08-12 MED ORDER — ALPRAZOLAM 0.5 MG PO TABS
ORAL_TABLET | ORAL | 0 refills | Status: DC
Start: 2023-08-12 — End: 2023-09-28

## 2023-08-12 MED ORDER — ONDANSETRON HCL 4 MG PO TABS
ORAL_TABLET | ORAL | 0 refills | Status: DC
Start: 2023-08-12 — End: 2023-09-28

## 2023-08-12 NOTE — Telephone Encounter (Signed)
ERx 

## 2023-08-18 ENCOUNTER — Telehealth: Payer: Self-pay | Admitting: Family Medicine

## 2023-08-18 NOTE — Telephone Encounter (Signed)
Printed pt's current med/allergy. [Placed med list at front office.]  Spoke with pt notifying her med list is ready to pick up. Expresses her thanks.

## 2023-08-18 NOTE — Telephone Encounter (Signed)
Patient called in and stated that she needs a copy of her medications and allergies. She would like a call when this is done. Thank you!

## 2023-08-19 ENCOUNTER — Encounter: Payer: Self-pay | Admitting: Family Medicine

## 2023-08-25 MED ORDER — GABAPENTIN 300 MG PO CAPS: 300.0000 mg | ORAL_CAPSULE | Freq: Every day | ORAL | 1 refills | Status: DC

## 2023-08-25 NOTE — Telephone Encounter (Signed)
ERx 

## 2023-08-27 DIAGNOSIS — N3020 Other chronic cystitis without hematuria: Secondary | ICD-10-CM | POA: Diagnosis not present

## 2023-08-27 DIAGNOSIS — R3915 Urgency of urination: Secondary | ICD-10-CM | POA: Diagnosis not present

## 2023-08-27 DIAGNOSIS — N3000 Acute cystitis without hematuria: Secondary | ICD-10-CM | POA: Diagnosis not present

## 2023-08-31 ENCOUNTER — Ambulatory Visit (INDEPENDENT_AMBULATORY_CARE_PROVIDER_SITE_OTHER): Payer: PPO | Admitting: Primary Care

## 2023-08-31 VITALS — BP 110/70 | HR 76 | Temp 97.3°F | Wt 144.0 lb

## 2023-08-31 DIAGNOSIS — R1032 Left lower quadrant pain: Secondary | ICD-10-CM

## 2023-08-31 LAB — BASIC METABOLIC PANEL
BUN: 12 mg/dL (ref 6–23)
CO2: 29 meq/L (ref 19–32)
Calcium: 9.8 mg/dL (ref 8.4–10.5)
Chloride: 102 meq/L (ref 96–112)
Creatinine, Ser: 0.96 mg/dL (ref 0.40–1.20)
GFR: 56.6 mL/min — ABNORMAL LOW (ref >60.00)
Glucose, Bld: 111 mg/dL — ABNORMAL HIGH (ref 70–99)
Potassium: 4 meq/L (ref 3.5–5.1)
Sodium: 139 meq/L (ref 135–145)

## 2023-08-31 LAB — CBC WITH DIFFERENTIAL/PLATELET
Basophils Absolute: 0 10*3/uL (ref 0.0–0.1)
Basophils Relative: 0.3 % (ref 0.0–3.0)
Eosinophils Absolute: 0.1 10*3/uL (ref 0.0–0.7)
Eosinophils Relative: 1.1 % (ref 0.0–5.0)
HCT: 33.5 % — ABNORMAL LOW (ref 36.0–46.0)
Hemoglobin: 11.4 g/dL — ABNORMAL LOW (ref 12.0–15.0)
Lymphocytes Relative: 36.5 % (ref 12.0–46.0)
Lymphs Abs: 3.1 10*3/uL (ref 0.7–4.0)
MCHC: 34 g/dL (ref 30.0–36.0)
MCV: 87.3 fL (ref 78.0–100.0)
Monocytes Absolute: 0.7 10*3/uL (ref 0.1–1.0)
Monocytes Relative: 8.9 % (ref 3.0–12.0)
Neutro Abs: 4.5 10*3/uL (ref 1.4–7.7)
Neutrophils Relative %: 53.2 % (ref 43.0–77.0)
Platelets: 160 10*3/uL (ref 150.0–400.0)
RBC: 3.84 Mil/uL — ABNORMAL LOW (ref 3.87–5.11)
RDW: 13.8 % (ref 11.5–15.5)
WBC: 8.4 10*3/uL (ref 4.0–10.5)
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
## 2023-08-31 NOTE — Patient Instructions (Signed)
Stop by the lab prior to leaving today. I will notify you of your results once received.   You will receive a phone call regarding your CT scan.  It was a pleasure to see you today!

## 2023-08-31 NOTE — Assessment & Plan Note (Signed)
Differentials include acute diverticulitis, ischemic colitis given her history, constipation.  Reviewed colonoscopy from 2022 which does reveal sigmoid diverticulosis.  Exam today concerning for acute abdominal process.  Stat CT abdomen/pelvis ordered and pending. CBC with differential and BMP pending.  Hospital precautions provided.

## 2023-09-01 ENCOUNTER — Ambulatory Visit
Admission: RE | Admit: 2023-09-01 | Discharge: 2023-09-01 | Disposition: A | Discharge status: Home / Self Care | Payer: PPO | Source: Ambulatory Visit | Attending: Primary Care | Admitting: Primary Care

## 2023-09-01 DIAGNOSIS — R1032 Left lower quadrant pain: Secondary | ICD-10-CM | POA: Insufficient documentation

## 2023-09-01 DIAGNOSIS — K769 Liver disease, unspecified: Secondary | ICD-10-CM | POA: Diagnosis not present

## 2023-09-01 DIAGNOSIS — K449 Diaphragmatic hernia without obstruction or gangrene: Secondary | ICD-10-CM | POA: Diagnosis not present

## 2023-09-01 DIAGNOSIS — K5730 Diverticulosis of large intestine without perforation or abscess without bleeding: Secondary | ICD-10-CM | POA: Diagnosis not present

## 2023-09-01 DIAGNOSIS — K6389 Other specified diseases of intestine: Secondary | ICD-10-CM | POA: Diagnosis not present

## 2023-09-01 MED ORDER — IOHEXOL 300 MG/ML  SOLN
100.0000 mL | Freq: Once | INTRAMUSCULAR | Status: AC | PRN
  Administered 2023-09-01: 100 mL via INTRAVENOUS

## 2023-09-28 ENCOUNTER — Other Ambulatory Visit: Payer: Self-pay

## 2023-09-28 DIAGNOSIS — F419 Anxiety disorder, unspecified: Secondary | ICD-10-CM

## 2023-09-28 DIAGNOSIS — R112 Nausea with vomiting, unspecified: Secondary | ICD-10-CM

## 2023-09-28 NOTE — Telephone Encounter (Addendum)
Name of Medication:  Alprazolam  Name of Pharmacy:  [facility] Last Fill or Written Date and Quantity:  08/12/23, #40 Last Office Visit and Type:  06/23/23, rash Next Office Visit and Type:  12/01/23, 6 mo f/u Last Controlled Substance Agreement Date:  04/16/17 Last UDS:  04/16/17  Zofran last rx:  08/12/23, #20

## 2023-09-29 MED ORDER — ONDANSETRON HCL 4 MG PO TABS: ORAL_TABLET | ORAL | 0 refills | Status: DC

## 2023-09-29 MED ORDER — ALPRAZOLAM 0.5 MG PO TABS: ORAL_TABLET | ORAL | 0 refills | Status: DC

## 2023-09-29 NOTE — Telephone Encounter (Signed)
ERx 

## 2023-10-04 ENCOUNTER — Ambulatory Visit
Admission: RE | Admit: 2023-10-04 | Discharge: 2023-10-04 | Disposition: A | Discharge status: Home / Self Care | Payer: PPO | Source: Ambulatory Visit | Attending: Family Medicine | Admitting: Family Medicine

## 2023-10-04 DIAGNOSIS — Z1231 Encounter for screening mammogram for malignant neoplasm of breast: Secondary | ICD-10-CM

## 2023-10-07 DIAGNOSIS — N3020 Other chronic cystitis without hematuria: Secondary | ICD-10-CM | POA: Diagnosis not present

## 2023-10-07 DIAGNOSIS — R102 Pelvic and perineal pain: Secondary | ICD-10-CM | POA: Diagnosis not present

## 2023-10-14 ENCOUNTER — Telehealth: Payer: Self-pay

## 2023-10-14 ENCOUNTER — Other Ambulatory Visit: Payer: Self-pay | Admitting: Family Medicine

## 2023-10-14 DIAGNOSIS — M1611 Unilateral primary osteoarthritis, right hip: Secondary | ICD-10-CM

## 2023-10-14 NOTE — Telephone Encounter (Signed)
Robaxin Last filled:  01/19/23, #30 Last OV:  06/23/23, skin rash Next OV:  12/01/23, 6 mo f/u

## 2023-10-14 NOTE — Telephone Encounter (Signed)
PA started via CoverMyMeds.

## 2023-10-21 ENCOUNTER — Ambulatory Visit: Payer: Self-pay | Admitting: Family Medicine

## 2023-10-21 NOTE — Telephone Encounter (Signed)
 Will see Monday  In interim recommend increase omeprazole to 40mg  BID and/or add pepcid OTC 20mg  nightly.

## 2023-10-21 NOTE — Telephone Encounter (Signed)
 Called patient gave all information. She will try as directed and reach out if any questions.

## 2023-10-21 NOTE — Telephone Encounter (Signed)
 Copied from CRM [id number]. Topic: Clinical - Red Word Triage >> Oct 21, 2023  8:58 AM Macario HERO wrote: Red Word that prompted transfer to Nurse Triage: Really bad GI reflex. Coughing, chest hurts and acid is coming up when cough. (cheat pain on top of breast bone)  Chief Complaint: gastric reflux Symptoms: fluids coming up throat and causing pain Frequency: comes and goes Pertinent Negatives: Patient denies fever Disposition: [] ED /[] Urgent Care (no appt availability in office) / [x] Appointment(In office/virtual)/ []  [facility]/ [] Home Care/ [] Refused Recommended Disposition /[] [facility]/ []  Follow-up with PCP Additional Notes: patient called in with fluids coming up her throat, gastric reflux  states had episode on Friday and it keeps happening.  Wanted apt to see pcp today and none available.  Scheduled apt. For Monday.  Instructed to go to ER if becomes worse.   Reason for Disposition  Abdominal pains regularly occur about 1 hour after meals  Answer Assessment - Initial Assessment Questions 1. LOCATION: Where does it hurt?      Gastric reflux - pain in chest 2. RADIATION: Does the pain shoot anywhere else? (e.g., chest, back)     denies 3. ONSET: When did the pain begin? (e.g., minutes, hours or days ago)      Friday night  4. SUDDEN: Gradual or sudden onset?     sudden 5. PATTERN Does the pain come and go, or is it constant?    - If it comes and goes: How long does it last? Do you have pain now?     (Note: Comes and goes means the pain is intermittent. It goes away completely between bouts.)    - If constant: Is it getting better, staying the same, or getting worse?      (Note: Constant means the pain never goes away completely; most serious pain is constant and gets worse.)      Comes and goes 6. SEVERITY: How bad is the pain?  (e.g., Scale 1-10; mild, moderate, or severe)    - MILD (1-3): Doesn't interfere with normal activities, abdomen  soft and not tender to touch..     - MODERATE (4-7): Interferes with normal activities or awakens from sleep, abdomen tender to touch.     - SEVERE (8-10): Excruciating pain, doubled over, unable to do any normal activities.       It woke me up this am 7. RECURRENT SYMPTOM: Have you ever had this type of stomach pain before? If Yes, ask: When was the last time? and What happened that time?      yes 8. AGGRAVATING FACTORS: Does anything seem to cause this pain? (e.g., foods, stress, alcohol)     food 9. CARDIAC SYMPTOMS: Do you have any of the following symptoms: chest pain, difficulty breathing, sweating, nausea?     Just hot 10. OTHER SYMPTOMS: Do you have any other symptoms? (e.g., back pain, diarrhea, fever, urination pain, vomiting)       denies  Protocols used: Abdominal Pain - Upper-A-AH

## 2023-10-24 NOTE — Progress Notes (Deleted)
    Ajmal Kathan T. Kazuma Elena, MD, CAQ Sports Medicine [facility] [address] [place], [address]  Phone: [phone]  FAX: [phone]  MAKEILA YAMAGUCHI - 78 y.o. female  MRN [id number]  Date of Birth: 04-23-1946  Date: 10/25/2023  PCP: Rilla Baller, MD  Referral: Rilla Baller, MD  No chief complaint on file.  Subjective:   Bianca Fry is a 78 y.o. very pleasant female patient with There is no height or weight on file to calculate BMI. who presents with the following:  I am asked to see the patient for chronic esophageal reflux.  She previously was doing omeprazole  40 mg, and was alerted by the nursing staff to increase this to twice daily.  Suggestion of possible H2 blockade, as well.  She had a CT of her abdomen and pelvis on September 01, 2023, which is essentially unremarkable except for diverticulosis.    Review of Systems is noted in the HPI, as appropriate  Objective:   LMP 10/20/1979 (Within Years)   GEN: No acute distress; alert,appropriate. PULM: Breathing comfortably in no respiratory distress PSYCH: Normally interactive.   Laboratory and Imaging Data:  Assessment and Plan:   ***

## 2023-10-25 ENCOUNTER — Ambulatory Visit: Payer: PPO | Admitting: Family Medicine

## 2023-10-25 ENCOUNTER — Telehealth: Payer: Self-pay | Admitting: Family Medicine

## 2023-10-25 ENCOUNTER — Encounter: Payer: Self-pay | Admitting: *Deleted

## 2023-10-25 DIAGNOSIS — K219 Gastro-esophageal reflux disease without esophagitis: Secondary | ICD-10-CM

## 2023-10-25 NOTE — Telephone Encounter (Signed)
 Left message for Bianca Fry that office is not opening until 10:00 am today and to please call office to get her appointment rescheduled.  MyChart message also sent to patient.

## 2023-10-28 DIAGNOSIS — H02052 Trichiasis without entropian right lower eyelid: Secondary | ICD-10-CM | POA: Diagnosis not present

## 2023-11-01 ENCOUNTER — Emergency Department: Payer: PPO

## 2023-11-01 ENCOUNTER — Emergency Department
Admission: EM | Admit: 2023-11-01 | Discharge: 2023-11-01 | Disposition: A | Discharge status: Home / Self Care | Payer: PPO | Attending: Emergency Medicine | Admitting: Emergency Medicine

## 2023-11-01 ENCOUNTER — Other Ambulatory Visit: Payer: Self-pay

## 2023-11-01 DIAGNOSIS — I2510 Atherosclerotic heart disease of native coronary artery without angina pectoris: Secondary | ICD-10-CM | POA: Diagnosis not present

## 2023-11-01 DIAGNOSIS — Z79899 Other long term (current) drug therapy: Secondary | ICD-10-CM | POA: Diagnosis not present

## 2023-11-01 DIAGNOSIS — Z7982 Long term (current) use of aspirin: Secondary | ICD-10-CM | POA: Insufficient documentation

## 2023-11-01 DIAGNOSIS — M542 Cervicalgia: Secondary | ICD-10-CM | POA: Diagnosis not present

## 2023-11-01 DIAGNOSIS — I700 Atherosclerosis of aorta: Secondary | ICD-10-CM | POA: Diagnosis not present

## 2023-11-01 DIAGNOSIS — I10 Essential (primary) hypertension: Secondary | ICD-10-CM | POA: Insufficient documentation

## 2023-11-01 DIAGNOSIS — Z8616 Personal history of COVID-19: Secondary | ICD-10-CM | POA: Insufficient documentation

## 2023-11-01 DIAGNOSIS — K219 Gastro-esophageal reflux disease without esophagitis: Secondary | ICD-10-CM | POA: Diagnosis not present

## 2023-11-01 DIAGNOSIS — Z85828 Personal history of other malignant neoplasm of skin: Secondary | ICD-10-CM | POA: Diagnosis not present

## 2023-11-01 DIAGNOSIS — R0989 Other specified symptoms and signs involving the circulatory and respiratory systems: Secondary | ICD-10-CM | POA: Diagnosis not present

## 2023-11-01 DIAGNOSIS — R0789 Other chest pain: Secondary | ICD-10-CM | POA: Diagnosis not present

## 2023-11-01 DIAGNOSIS — R079 Chest pain, unspecified: Secondary | ICD-10-CM

## 2023-11-01 LAB — CBC
HCT: 34.1 % — ABNORMAL LOW (ref 36.0–46.0)
Hemoglobin: 11.1 g/dL — ABNORMAL LOW (ref 12.0–15.0)
MCH: 28.2 pg (ref 26.0–34.0)
MCHC: 32.6 g/dL (ref 30.0–36.0)
MCV: 86.8 fL (ref 80.0–100.0)
Platelets: 152 10*3/uL (ref 150–400)
RBC: 3.93 MIL/uL (ref 3.87–5.11)
RDW: 13.3 % (ref 11.5–15.5)
WBC: 7.9 10*3/uL (ref 4.0–10.5)
nRBC: 0 % (ref 0.0–0.2)

## 2023-11-01 LAB — COMPREHENSIVE METABOLIC PANEL
ALT: 13 U/L (ref 0–44)
AST: 18 U/L (ref 15–41)
Albumin: 3.8 g/dL (ref 3.5–5.0)
Alkaline Phosphatase: 78 U/L (ref 38–126)
Anion gap: 10 (ref 5–15)
BUN: 14 mg/dL (ref 8–23)
CO2: 25 mmol/L (ref 22–32)
Calcium: 9.7 mg/dL (ref 8.9–10.3)
Chloride: 104 mmol/L (ref 98–111)
Creatinine, Ser: 0.76 mg/dL (ref 0.44–1.00)
GFR, Estimated: >60 mL/min (ref >60)
Glucose, Bld: 112 mg/dL — ABNORMAL HIGH (ref 70–99)
Potassium: 3.6 mmol/L (ref 3.5–5.1)
Sodium: 139 mmol/L (ref 135–145)
Total Bilirubin: 0.8 mg/dL (ref 0.0–1.2)
Total Protein: 6.8 g/dL (ref 6.5–8.1)

## 2023-11-01 LAB — TROPONIN I (HIGH SENSITIVITY)
Troponin I (High Sensitivity): 5 ng/L (ref <18)
Troponin I (High Sensitivity): 5 ng/L

## 2023-11-01 LAB — LIPASE, BLOOD: Lipase: 26 U/L (ref 11–51)

## 2023-11-01 MED ORDER — FAMOTIDINE IN NACL 20-0.9 MG/50ML-% IV SOLN
20.0000 mg | Freq: Once | INTRAVENOUS | Status: AC
  Administered 2023-11-01: 20 mg via INTRAVENOUS
  Filled 2023-11-01: qty 50

## 2023-11-01 NOTE — ED Notes (Signed)
 To X-ray
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
## 2023-11-01 NOTE — ED Triage Notes (Signed)
 Pt to ED for chest pain started an hour ago, radiating to left side of neck. Denies shob.

## 2023-11-01 NOTE — ED Notes (Signed)
 Assisted patient to the bathroom. Patient states she has chest pain when ambulating. Rates pain 3/10.

## 2023-11-01 NOTE — ED Provider Notes (Signed)
  Physical Exam  BP (!) 170/57   Pulse 64   Temp 97.7 F (36.5 C) (Oral)   Resp 17   Ht 5' 4 (1.626 m)   Wt 64 kg   LMP 10/20/1979 (Within Years)   SpO2 97%   BMI 24.20 kg/m   Physical Exam  Procedures  Procedures  ED Course / MDM   Clinical Course as of 11/01/23 0823  Mon Nov 01, 2023  9362 Patient feeling better with IV Pepcid  infusing.  Reports sensation of face completely resolved.  Awaiting laboratory results at this time.  Care will be transferred to the oncoming provider pending laboratory results.  If unremarkable, would repeat troponin. [JS]    Clinical Course User Index [JS] Robinette Vermell PARAS, MD   Medical Decision Making Amount and/or Complexity of Data Reviewed Labs: ordered. Radiology: ordered.  Risk Prescription drug management.   Received patient in signout.  78 year old female presenting today for chest pain with history of GERD.  Felt a burning sensation similar to her previous GERD.  Also had some left jaw pain similar to prior TMJ pain.  Otherwise no associated symptoms.  Initial EKG without signs of ischemia and first troponin within normal limits.  Other laboratory workup reassuring and patient did have symptomatic improvement with IV Pepcid .  Patient signed out pending repeat troponin.  Repeat troponin stable.  Patient reassessed and otherwise feeling back to baseline.  Will discharge with follow-up with PCP.  Patient was agreeable with plan.     Malvina Alm DASEN, MD 11/01/23 [phone]

## 2023-11-02 ENCOUNTER — Ambulatory Visit: Payer: PPO | Admitting: Family Medicine

## 2023-11-02 ENCOUNTER — Encounter: Payer: Self-pay | Admitting: Family Medicine

## 2023-11-02 VITALS — BP 148/66 | HR 81 | Temp 97.1°F | Ht 64.0 in | Wt 144.2 lb

## 2023-11-02 DIAGNOSIS — I10 Essential (primary) hypertension: Secondary | ICD-10-CM

## 2023-11-02 DIAGNOSIS — K2100 Gastro-esophageal reflux disease with esophagitis, without bleeding: Secondary | ICD-10-CM

## 2023-11-02 MED ORDER — ESOMEPRAZOLE MAGNESIUM 40 MG PO CPDR: 40.0000 mg | DELAYED_RELEASE_CAPSULE | Freq: Every day | ORAL | 3 refills | Status: DC

## 2023-11-02 MED ORDER — SUCRALFATE 1 G PO TABS: 1.0000 g | ORAL_TABLET | Freq: Three times a day (TID) | ORAL | 0 refills | Status: DC | PRN

## 2023-11-02 NOTE — Assessment & Plan Note (Addendum)
 Longterm on omeprazole  40mg  daily, latest EGD 1999 with erosive gastritis, CLO test negative for H pylori at that time.  Recent worsening GERD symptoms with worsening burning discomfort, with multiple food triggers for episodes.  Will increase omeprazole  to 40mg  BID x 2-3 wks prior to upcoming hip replacement then change to esomeprazole  40mg  daily.  Rx carafate  tablets to use PRN sparingly. Discussed possible rpt H pylori testing vs GI referral if ongoing symptoms despite above. Encouraged staying off NSAIDs

## 2023-11-02 NOTE — Assessment & Plan Note (Signed)
 BP elevated despite losartan and carvedilol - she will monitor at home and notify me if consistently >140/90

## 2023-11-02 NOTE — Progress Notes (Signed)
 Ph: [phone] Fax: [phone]   Patient ID: Bianca Fry, female    DOB: 01-01-1946, 78 y.o.   MRN: [id number]  This visit was conducted in person.  BP (!) 148/66   Pulse 81   Temp (!) 97.1 F (36.2 C) (Oral)   Ht 5' 4 (1.626 m)   Wt 144 lb 4 oz (65.4 kg)   LMP 10/20/1979 (Within Years)   SpO2 97%   BMI 24.76 kg/m    CC: ER f/u visit  Subjective:   HPI: Bianca Fry is a 78 y.o. female presenting on 11/02/2023 for Hospitalization Follow-up (Seen on 10/31/22 at [facility] ED, dx chest pain; GERD. )   Recent ER visit to [facility] yesterday for sharp pain to left chest which occurred when she got up overnight to go to bathroom. EKG and troponins negative for cardiac cause - attributed to GERD treated with IV pepcid  infusion. She also had left jaw pain attributed to possible TMJ inflammation. BP was high on presentation to ER.   Has been having worsening spells of reflux the last few months - a few per month. Flares have been OJ, pizza, butter. Notes cucumbers help. Throat felt raw, has led to worsening coughing.   Normally takes omeprazole  40mg  daily - on this for years. Over weekend she had increased omeprazole  to 40mg  BID with significant benefit. Pepcid  20mg  nightly hasn't really helped.  Didn't try bentyl  for these symptoms.   No dysphagia, significant early satiety, unexpected weight loss.   Latest CT abd/pelvis with contrast 08/2023 reviewed.  IMPRESSION: Sigmoid diverticulosis with associated muscular hypertrophy. No radiographic evidence of diverticulitis or other acute findings. Stable tiny hiatal hernia.   H/o diverticulitis and ischemic colitis and IBS alternating D/C - overall better since stopping oral iron  replacement.   GI - has not established locally but would want to see [facility]).  Latest EGD 1999 - erosive gastritis Colonoscopy 03/2021 - diverticulosis, mucosal ulceration [facility])  Upcoming total hip replacement planned 11/14/2022.  She is taking  meloxicam 7.5mg  PRN sparingly - usually only takes tylenol .      Relevant past medical, surgical, family and social history reviewed and updated as indicated. Interim medical history since our last visit reviewed. Allergies and medications reviewed and updated. Outpatient Medications Prior to Visit  Medication Sig Dispense Refill   ALPRAZolam  (XANAX ) 0.5 MG tablet TAKE ONE TABLET BY MOUTH TWICE A DAY AS NEEDED FOR ANXIETY 40 tablet 0   aspirin  EC 81 MG tablet Take 1 tablet (81 mg total) by mouth daily. Swallow whole.     atorvastatin  (LIPITOR) 10 MG tablet Take 1 tablet (10 mg total) by mouth daily. 90 tablet 3   Calcium  Carb-Cholecalciferol  (CALCIUM -VITAMIN D ) 600-400 MG-UNIT TABS Take 1 tablet by mouth daily.     carvedilol  (COREG ) 6.25 MG tablet Take 1 tablet (6.25 mg total) by mouth 2 (two) times daily with a meal. 180 tablet 3   cholecalciferol  (VITAMIN D ) 25 MCG (1000 UNIT) tablet Take 1,000 Units by mouth daily.     Cyanocobalamin  (B-12) 1000 MCG SUBL Place 1 tablet under the tongue every Monday, Wednesday, and Friday.     dicyclomine  (BENTYL ) 10 MG capsule Take 1 capsule (10 mg total) by mouth 2 (two) times daily as needed for spasms. 90 capsule 1   EPINEPHrine  0.3 mg/0.3 mL IJ SOAJ injection Inject 0.3 mg into the muscle as needed for anaphylaxis. 1 each 1   estradiol  (ESTRACE ) 0.1 MG/GM vaginal cream Use 1/2 g  vaginally two or three times per week as needed to maintain symptom relief. 42.5 g 2   fexofenadine (ALLEGRA) 180 MG tablet Take 180 mg by mouth daily as needed for allergies.     gabapentin  (NEURONTIN ) 300 MG capsule Take 1 capsule (300 mg total) by mouth at bedtime. 90 capsule 1   losartan  (COZAAR ) 50 MG tablet Take 1.5 tablets (75 mg total) by mouth daily. 135 tablet 4   meloxicam (MOBIC) 7.5 MG tablet Take 7.5 mg by mouth daily.     methocarbamol  (ROBAXIN ) 500 MG tablet TAKE ONE TABLET BY MOUTH THREE TIMES A DAY AS NEEDED FOR MUSCLE SPASMS (SEDATION PRECAUTIONS) 30 tablet 0    ondansetron  (ZOFRAN ) 4 MG tablet TAKE ONE TABLET BY MOUTH EVERY 8 HOURS AS NEEDED FOR NAUSEA AND VOMITING 20 tablet 0   triamcinolone  cream (KENALOG ) 0.1 % SMARTSIG:1 sparingly Topical Twice Daily     valACYclovir  (VALTREX ) 500 MG tablet Take one tablet (500 mg) by mouth twice a day for 3 days as needed for a genital outbreak.  Take 4 tablets (2000 mg) by mouth twice a day for 1 day as needed for an oral outbreak. 30 tablet 1   CRANBERRY PO Take by mouth.     omeprazole  (PRILOSEC) 40 MG capsule Take 1 capsule (40 mg total) by mouth daily. 90 capsule 4   No facility-administered medications prior to visit.     Per HPI unless specifically indicated in ROS section below Review of Systems  Objective:  BP (!) 148/66   Pulse 81   Temp (!) 97.1 F (36.2 C) (Oral)   Ht 5' 4 (1.626 m)   Wt 144 lb 4 oz (65.4 kg)   LMP 10/20/1979 (Within Years)   SpO2 97%   BMI 24.76 kg/m   Wt Readings from Last 3 Encounters:  11/02/23 144 lb 4 oz (65.4 kg)  11/01/23 141 lb (64 kg)  08/31/23 144 lb (65.3 kg)      Physical Exam Vitals and nursing note reviewed.  Constitutional:      Appearance: Normal appearance. She is not ill-appearing.  HENT:     Head: Normocephalic and atraumatic.     Mouth/Throat:     Mouth: Mucous membranes are moist.     Pharynx: Oropharynx is clear. No oropharyngeal exudate or posterior oropharyngeal erythema.  Eyes:     Extraocular Movements: Extraocular movements intact.     Conjunctiva/sclera: Conjunctivae normal.     Pupils: Pupils are equal, round, and reactive to light.  Neck:     Thyroid : No thyroid  mass or thyromegaly.  Cardiovascular:     Rate and Rhythm: Normal rate and regular rhythm.     Pulses: Normal pulses.     Heart sounds: Normal heart sounds. No murmur heard. Pulmonary:     Effort: Pulmonary effort is normal. No respiratory distress.     Breath sounds: Normal breath sounds. No wheezing, rhonchi or rales.  Abdominal:     General: Bowel sounds are  normal. There is no distension.     Palpations: Abdomen is soft. There is no mass.     Tenderness: There is abdominal tenderness (mild-mod) in the epigastric area. There is no guarding or rebound. Negative signs include Murphy's sign.     Hernia: No hernia is present.  Musculoskeletal:     Cervical back: Normal range of motion and neck supple.     Right lower leg: No edema.     Left lower leg: No edema.  Skin:  General: Skin is warm and dry.     Findings: No rash.  Neurological:     Mental Status: She is alert.  Psychiatric:        Mood and Affect: Mood normal.        Behavior: Behavior normal.       Results for orders placed or performed during the hospital encounter of 11/01/23  CBC   Collection Time: 11/01/23  6:02 AM  Result Value Ref Range   WBC 7.9 4.0 - 10.5 K/uL   RBC 3.93 3.87 - 5.11 MIL/uL   Hemoglobin 11.1 (L) 12.0 - 15.0 g/dL   HCT 65.8 (L) 63.9 - 53.9 %   MCV 86.8 80.0 - 100.0 fL   MCH 28.2 26.0 - 34.0 pg   MCHC 32.6 30.0 - 36.0 g/dL   RDW 86.6 88.4 - 84.4 %   Platelets 152 150 - 400 K/uL   nRBC 0.0 0.0 - 0.2 %  Comprehensive metabolic panel   Collection Time: 11/01/23  6:02 AM  Result Value Ref Range   Sodium 139 135 - 145 mmol/L   Potassium 3.6 3.5 - 5.1 mmol/L   Chloride 104 98 - 111 mmol/L   CO2 25 22 - 32 mmol/L   Glucose, Bld 112 (H) 70 - 99 mg/dL   BUN 14 8 - 23 mg/dL   Creatinine, Ser 9.23 0.44 - 1.00 mg/dL   Calcium  9.7 8.9 - 10.3 mg/dL   Total Protein 6.8 6.5 - 8.1 g/dL   Albumin 3.8 3.5 - 5.0 g/dL   AST 18 15 - 41 U/L   ALT 13 0 - 44 U/L   Alkaline Phosphatase 78 38 - 126 U/L   Total Bilirubin 0.8 0.0 - 1.2 mg/dL   GFR, Estimated >39 >39 mL/min   Anion gap 10 5 - 15  Lipase, blood   Collection Time: 11/01/23  6:02 AM  Result Value Ref Range   Lipase 26 11 - 51 U/L  Troponin I (High Sensitivity)   Collection Time: 11/01/23  6:02 AM  Result Value Ref Range   Troponin I (High Sensitivity) 5 <18 ng/L  Troponin I (High Sensitivity)    Collection Time: 11/01/23  7:41 AM  Result Value Ref Range   Troponin I (High Sensitivity) 5 <18 ng/L   Lab Results  Component Value Date   IRON  59 05/21/2023   TIBC 310.8 05/21/2023   FERRITIN 27.1 05/21/2023   Assessment & Plan:   Problem List Items Addressed This Visit     Essential hypertension   BP elevated despite losartan  and carvedilol  - she will monitor at home and notify me if consistently >140/90      GERD - Primary   Longterm on omeprazole  40mg  daily, latest EGD 1999 with erosive gastritis, CLO test negative for H pylori at that time.  Recent worsening GERD symptoms with worsening burning discomfort, with multiple food triggers for episodes.  Will increase omeprazole  to 40mg  BID x 2-3 wks prior to upcoming hip replacement then change to esomeprazole  40mg  daily.  Rx carafate  tablets to use PRN sparingly. Discussed possible rpt H pylori testing vs GI referral if ongoing symptoms despite above. Encouraged staying off NSAIDs      Relevant Medications   esomeprazole  (NEXIUM ) 40 MG capsule   sucralfate  (CARAFATE ) 1 g tablet     Meds ordered this encounter  Medications   esomeprazole  (NEXIUM ) 40 MG capsule    Sig: Take 1 capsule (40 mg total) by mouth daily at 12 noon.  Dispense:  90 capsule    Refill:  3    To replace omeprazole  40mg    sucralfate  (CARAFATE ) 1 g tablet    Sig: Take 1 tablet (1 g total) by mouth 3 (three) times daily with meals as needed (reflux).    Dispense:  60 tablet    Refill:  0    No orders of the defined types were placed in this encounter.   Patient Instructions  Take omeprazole  twice daily for 2-3 weeks then after this, in its place start Nexium  40mg  daily.  Limit anti inflammatories like Mobic.   Avoid citrus, fatty foods, chocolate, peppermint, and excessive alcohol, along with sodas, orange juice (acidic drinks).  At least a few hours between dinner and bed, minimize naps after eating. Head of bed elevated.   Follow up  plan: No follow-ups on file.  Anton Blas, MD

## 2023-11-02 NOTE — Patient Instructions (Addendum)
 Take omeprazole  twice daily for 2-3 weeks then after this, in its place start Nexium  40mg  daily.  Limit anti inflammatories like Mobic.   Avoid citrus, fatty foods, chocolate, peppermint, and excessive alcohol, along with sodas, orange juice (acidic drinks).  At least a few hours between dinner and bed, minimize naps after eating. Head of bed elevated.

## 2023-11-02 NOTE — Assessment & Plan Note (Deleted)
 Recent levels stable. Oral iron caused worsening GI upset.

## 2023-11-05 ENCOUNTER — Telehealth: Payer: PPO | Admitting: Emergency Medicine

## 2023-11-05 ENCOUNTER — Ambulatory Visit: Payer: Self-pay | Admitting: Family Medicine

## 2023-11-05 DIAGNOSIS — U071 COVID-19: Secondary | ICD-10-CM

## 2023-11-05 NOTE — Telephone Encounter (Signed)
Copied from CRM [id number]. Topic: Clinical - Pink Word Triage >> Nov 05, 2023  8:11 AM Isabell A wrote: Reason for Triage: Patient tested positive for covid last night - coughing, slept for 2 days, headaches, nasal congestion.  Patient requesting to speak with nurse triage.  Chief Complaint: cough Symptoms: Runny nose, stuffy nose, coughing, tested positive for COVID last night Frequency: tested positive last for COVID Pertinent Negatives: Patient denies SOB Disposition: [] ED /[] Urgent Care (no appt availability in office) / [x] Appointment(In office/virtual)/ []  [facility]/ [] Home Care/ [] Refused Recommended Disposition /[] [facility]/ []  Follow-up with PCP Additional Notes: virtual appt made  Reason for Disposition  SEVERE coughing spells (e.g., whooping sound after coughing, vomiting after coughing)  Answer Assessment - Initial Assessment Questions 1. ONSET: "When did the cough begin?"      X 1 week and gotten worse 2. SEVERITY: "How bad is the cough today?"      moderate 3. SPUTUM: "Describe the color of your sputum" (none, dry cough; clear, white, yellow, green)     No sputum 4. HEMOPTYSIS: "Are you coughing up any blood?" If so ask: "How much?" (flecks, streaks, tablespoons, etc.)     no 5. DIFFICULTY BREATHING: "Are you having difficulty breathing?" If Yes, ask: "How bad is it?" (e.g., mild, moderate, severe)    - MILD: No SOB at rest, mild SOB with walking, speaks normally in sentences, can lie down, no retractions, pulse < 100.    - MODERATE: SOB at rest, SOB with minimal exertion and prefers to sit, cannot lie down flat, speaks in phrases, mild retractions, audible wheezing, pulse 100-120.    - SEVERE: Very SOB at rest, speaks in single words, struggling to breathe, sitting hunched forward, retractions, pulse > 120      no 6. FEVER: "Do you have a fever?" If Yes, ask: "What is your temperature, how was it measured, and when did it start?"    Yes 7.  CARDIAC HISTORY: "Do you have any history of heart disease?" (e.g., heart attack, congestive heart failure)      no 8. LUNG HISTORY: "Do you have any history of lung disease?"  (e.g., pulmonary embolus, asthma, emphysema)     no 9. PE RISK FACTORS: "Do you have a history of blood clots?" (or: recent major surgery, recent prolonged travel, bedridden)     no 10. OTHER SYMPTOMS: "Do you have any other symptoms?" (e.g., runny nose, wheezing, chest pain)       Runny nose, stuffy nose, coughing,  11. PREGNANCY: "Is there any chance you are pregnant?" "When was your last menstrual period?"       no 12. TRAVEL: "Have you traveled out of the country in the last month?" (e.g., travel history, exposures)       no  Protocols used: Cough - Acute Non-Productive-A-AH

## 2023-11-05 NOTE — Telephone Encounter (Signed)
Noted. Glad she was able to be seen virtually.  Looks like she declined paxlovid.

## 2023-11-05 NOTE — Patient Instructions (Signed)
Bianca Fry, thank you for joining [name], PA-C for today's virtual visit.  While this provider is not your primary care provider (PCP), if your PCP is located in our provider database this encounter information will be shared with them immediately following your visit.   A [facility] MyChart account gives you access to today's visit and all your visits, tests, and labs performed at [facility] " click here if you don't have a [facility] MyChart account or go to [email or web address]  Consent: (Patient) Bianca Fry provided verbal consent for this virtual visit at the beginning of the encounter.  Current Medications:  Current Outpatient Medications:    acidophilus (RISAQUAD) CAPS capsule, Take 1 capsule by mouth daily., Disp: , Rfl:    ALPRAZolam (XANAX) 0.5 MG tablet, TAKE ONE TABLET BY MOUTH TWICE A DAY AS NEEDED FOR ANXIETY, Disp: 40 tablet, Rfl: 0   aspirin EC 81 MG tablet, Take 1 tablet (81 mg total) by mouth daily. Swallow whole., Disp: , Rfl:    atorvastatin (LIPITOR) 10 MG tablet, Take 1 tablet (10 mg total) by mouth daily., Disp: 90 tablet, Rfl: 3   Calcium Carb-Cholecalciferol (CALCIUM-VITAMIN D) 600-400 MG-UNIT TABS, Take 1 tablet by mouth daily., Disp: , Rfl:    carvedilol (COREG) 6.25 MG tablet, Take 1 tablet (6.25 mg total) by mouth 2 (two) times daily with a meal., Disp: 180 tablet, Rfl: 3   cholecalciferol (VITAMIN D) 25 MCG (1000 UNIT) tablet, Take 1,000 Units by mouth daily., Disp: , Rfl:    Cyanocobalamin (B-12) 1000 MCG SUBL, Place 1 tablet under the tongue every Monday, Wednesday, and Friday., Disp: , Rfl:    dicyclomine (BENTYL) 10 MG capsule, Take 1 capsule (10 mg total) by mouth 2 (two) times daily as needed for spasms., Disp: 90 capsule, Rfl: 1   EPINEPHrine 0.3 mg/0.3 mL IJ SOAJ injection, Inject 0.3 mg into the muscle as needed for anaphylaxis. (Patient not taking: Reported on 11/03/2023), Disp: 1 each, Rfl: 1   esomeprazole (NEXIUM) 40  MG capsule, Take 1 capsule (40 mg total) by mouth daily at 12 noon., Disp: 90 capsule, Rfl: 3   estradiol (ESTRACE) 0.1 MG/GM vaginal cream, Use 1/2 g vaginally two or three times per week as needed to maintain symptom relief., Disp: 42.5 g, Rfl: 2   fexofenadine (ALLEGRA) 180 MG tablet, Take 180 mg by mouth daily as needed for allergies., Disp: , Rfl:    gabapentin (NEURONTIN) 300 MG capsule, Take 1 capsule (300 mg total) by mouth at bedtime. (Patient taking differently: Take 300 mg by mouth at bedtime as needed (pain).), Disp: 90 capsule, Rfl: 1   losartan (COZAAR) 50 MG tablet, Take 1.5 tablets (75 mg total) by mouth daily., Disp: 135 tablet, Rfl: 4   meloxicam (MOBIC) 7.5 MG tablet, Take 7.5 mg by mouth daily., Disp: , Rfl:    methocarbamol (ROBAXIN) 500 MG tablet, TAKE ONE TABLET BY MOUTH THREE TIMES A DAY AS NEEDED FOR MUSCLE SPASMS (SEDATION PRECAUTIONS), Disp: 30 tablet, Rfl: 0   omeprazole (PRILOSEC) 40 MG capsule, Take 40 mg by mouth in the morning and at bedtime., Disp: , Rfl:    ondansetron (ZOFRAN) 4 MG tablet, TAKE ONE TABLET BY MOUTH EVERY 8 HOURS AS NEEDED FOR NAUSEA AND VOMITING, Disp: 20 tablet, Rfl: 0   sucralfate (CARAFATE) 1 g tablet, Take 1 tablet (1 g total) by mouth 3 (three) times daily with meals as needed (reflux)., Disp: 60 tablet, Rfl: 0   triamcinolone cream (KENALOG)  0.1 %, Apply 1 Application topically daily as needed (itching)., Disp: , Rfl:    valACYclovir (VALTREX) 500 MG tablet, Take one tablet (500 mg) by mouth twice a day for 3 days as needed for a genital outbreak.  Take 4 tablets (2000 mg) by mouth twice a day for 1 day as needed for an oral outbreak., Disp: 30 tablet, Rfl: 1   Medications ordered in this encounter:  No orders of the defined types were placed in this encounter.    *If you need refills on other medications prior to your next appointment, please contact your pharmacy*  Follow-Up: Call back or seek an in-person evaluation if the symptoms  worsen or if the condition fails to improve as anticipated.  [facility] [phone]  Other Instructions    If you have been instructed to have an in-person evaluation today at a local Urgent Care facility, please use the link below. It will take you to a list of all of our available [facility], including address, phone number and hours of operation. Please do not delay care.  [facility]  If you or a family member do not have a primary care provider, use the link below to schedule a visit and establish care. When you choose a [facility] primary care physician or advanced practice provider, you gain a long-term partner in health. Find a Primary Care Provider  Learn more about [facility]'s in-office and virtual care options: [facility] - Get Care Now

## 2023-11-05 NOTE — Progress Notes (Signed)
Virtual Visit Consent   [name], you are scheduled for a virtual visit with a [place] provider today. Just as with appointments in the office, your consent must be obtained to participate. Your consent will be active for this visit and any virtual visit you may have with one of our providers in the next 365 days. If you have a MyChart account, a copy of this consent can be sent to you electronically.  As this is a virtual visit, video technology does not allow for your provider to perform a traditional examination. This may limit your provider's ability to fully assess your condition. If your provider identifies any concerns that need to be evaluated in person or the need to arrange testing (such as labs, EKG, etc.), we will make arrangements to do so. Although advances in technology are sophisticated, we cannot ensure that it will always work on either your end or our end. If the connection with a video visit is poor, the visit may have to be switched to a telephone visit. With either a video or telephone visit, we are not always able to ensure that we have a secure connection.  By engaging in this virtual visit, you consent to the provision of healthcare and authorize for your insurance to be billed (if applicable) for the services provided during this visit. Depending on your insurance coverage, you may receive a charge related to this service.  I need to obtain your verbal consent now. Are you willing to proceed with your visit today? Bianca Fry has provided verbal consent on 11/05/2023 for a virtual visit (video or telephone). [name], PA-C  Date: 11/05/2023 10:51 AM  Virtual Visit via Video Note   I, [name], connected with  Bianca Fry  ([id number], May 19, 1946) on 11/05/23 at 10:45 AM EST by a video-enabled telemedicine application and verified that I am speaking with the correct person using two identifiers.  Location: Patient: Virtual Visit Location Patient:  Home Provider: Virtual Visit Location Provider: Home Office   I discussed the limitations of evaluation and management by telemedicine and the availability of in person appointments. The patient expressed understanding and agreed to proceed.    History of Present Illness: Bianca Fry is a 78 y.o. who identifies as a female who was assigned female at birth, and is being seen today for COVID.  States that she tested positive last night. States that she is feeling better today.  States that she has reflux and thought she was coughing from reflux.  States that she slept for 2 days, but feels better now.  States that she states that she took Paxlovid last time she had COVID and it made her feel worse.  She states that she is concerned because she is schedule to have a hip replacement in 10 days.  HPI: HPI  Problems:  Patient Active Problem List   Diagnosis Date Noted   Left lower quadrant abdominal pain 08/31/2023   Low serum vitamin B12 05/15/2023   Leg weakness, bilateral 03/31/2023   Chronic constipation 10/16/2022   Pain and swelling of left lower leg 09/24/2022   Paresthesia of left foot 05/23/2022   Thrombocytopenia (HCC) 05/23/2022   Anemia 05/23/2022   Primary osteoarthritis of right hip 05/18/2021   Pedal edema 04/01/2021   Coccyx pain 01/08/2021   Headache 09/10/2020   PAD (peripheral artery disease) (HCC) 02/10/2020   Palpitations 01/29/2020   Anxiety 01/09/2020   Chronic left-sided thoracic back pain 01/09/2020  Diverticulosis 12/17/2019   Skin rash 07/12/2019   Left sided sciatica 05/11/2019   Facial neuralgia 07/29/2018   Osteopenia 07/01/2018   Stenosis of right subclavian artery (HCC) 06/11/2018   Atrophic vaginitis 05/12/2018   Female pelvic pain 05/12/2018   Irritable bowel syndrome (IBS) 04/22/2018   Vitamin D deficiency 10/15/2017   Left carotid artery stenosis 04/16/2017   Prediabetes 04/16/2017   Congenital ptosis of right eyelid 04/16/2017   Epidermal  cyst 04/14/2017   Hemangioma 03/08/2017   Mixed incontinence 08/30/2016   Squamous cell carcinoma in situ 06/24/2016   Health maintenance examination 04/15/2016   CAD (coronary artery disease) 04/15/2016   Basal cell carcinoma 01/15/2016   Ephelides 12/25/2015   Seborrheic keratosis 12/25/2015   Verruca vulgaris 06/10/2015   Medicare annual wellness visit, subsequent 04/12/2015   Advanced care planning/counseling discussion 04/12/2015   Abdominal pain, chronic, left upper quadrant 01/16/2015   Lower abdominal pain 12/27/2014   Melanocytic nevi, unspecified 11/19/2014   Multiple lentigines syndrome 11/19/2014   Chronic right hip pain 02/28/2014   Ischemic colitis (HCC) 02/28/2014   Recurrent UTI 11/27/2013   Chest pain 09/05/2012   Adjustment disorder with depressed mood 12/27/2009   HLD (hyperlipidemia) 12/03/2008   Iron deficiency anemia 12/03/2008   Allergic rhinitis 07/09/2008   Essential hypertension 11/04/2007   GERD 05/19/2007    Allergies:  Allergies  Allergen Reactions   Codeine Other (See Comments)    Chest pain   Nitrofurantoin Other (See Comments)    Chest pain - hospitalization   Hydrocodone-Acetaminophen Other (See Comments)   Oxycodone-Acetaminophen Other (See Comments)   Percocet [Oxycodone-Acetaminophen] Nausea And Vomiting   Propoxyphene Nausea And Vomiting   Propoxyphene Other (See Comments)   Vicodin [Hydrocodone-Acetaminophen] Nausea And Vomiting   Amlodipine Other (See Comments)    Pedal edema   Boniva [Ibandronic Acid] Diarrhea    Severe watery diarrhea - led to ER visit   Elmiron [Pentosan Polysulfate]     Chest pain, tremors, wheezing    Fosamax [Alendronate] Other (See Comments)    R hip pain   Hctz [Hydrochlorothiazide] Other (See Comments)    headache   Metronidazole Diarrhea, Nausea Only, Other (See Comments) and Nausea And Vomiting   Paxlovid [Nirmatrelvir-Ritonavir] Diarrhea    Rectal bleeding    Vancomycin Diarrhea and Other (See  Comments)    Severe abdominal pain   Clarithromycin Other (See Comments)    Abd. cramps   Penicillins Rash   Prednisone Other (See Comments)    Caused UTI   Sulfonamide Derivatives Other (See Comments)    GI upset   Medications:  Current Outpatient Medications:    acidophilus (RISAQUAD) CAPS capsule, Take 1 capsule by mouth daily., Disp: , Rfl:    ALPRAZolam (XANAX) 0.5 MG tablet, TAKE ONE TABLET BY MOUTH TWICE A DAY AS NEEDED FOR ANXIETY, Disp: 40 tablet, Rfl: 0   aspirin EC 81 MG tablet, Take 1 tablet (81 mg total) by mouth daily. Swallow whole., Disp: , Rfl:    atorvastatin (LIPITOR) 10 MG tablet, Take 1 tablet (10 mg total) by mouth daily., Disp: 90 tablet, Rfl: 3   Calcium Carb-Cholecalciferol (CALCIUM-VITAMIN D) 600-400 MG-UNIT TABS, Take 1 tablet by mouth daily., Disp: , Rfl:    carvedilol (COREG) 6.25 MG tablet, Take 1 tablet (6.25 mg total) by mouth 2 (two) times daily with a meal., Disp: 180 tablet, Rfl: 3   cholecalciferol (VITAMIN D) 25 MCG (1000 UNIT) tablet, Take 1,000 Units by mouth daily., Disp: , Rfl:  Cyanocobalamin (B-12) 1000 MCG SUBL, Place 1 tablet under the tongue every Monday, Wednesday, and Friday., Disp: , Rfl:    dicyclomine (BENTYL) 10 MG capsule, Take 1 capsule (10 mg total) by mouth 2 (two) times daily as needed for spasms., Disp: 90 capsule, Rfl: 1   EPINEPHrine 0.3 mg/0.3 mL IJ SOAJ injection, Inject 0.3 mg into the muscle as needed for anaphylaxis. (Patient not taking: Reported on 11/03/2023), Disp: 1 each, Rfl: 1   esomeprazole (NEXIUM) 40 MG capsule, Take 1 capsule (40 mg total) by mouth daily at 12 noon., Disp: 90 capsule, Rfl: 3   estradiol (ESTRACE) 0.1 MG/GM vaginal cream, Use 1/2 g vaginally two or three times per week as needed to maintain symptom relief., Disp: 42.5 g, Rfl: 2   fexofenadine (ALLEGRA) 180 MG tablet, Take 180 mg by mouth daily as needed for allergies., Disp: , Rfl:    gabapentin (NEURONTIN) 300 MG capsule, Take 1 capsule (300 mg  total) by mouth at bedtime. (Patient taking differently: Take 300 mg by mouth at bedtime as needed (pain).), Disp: 90 capsule, Rfl: 1   losartan (COZAAR) 50 MG tablet, Take 1.5 tablets (75 mg total) by mouth daily., Disp: 135 tablet, Rfl: 4   meloxicam (MOBIC) 7.5 MG tablet, Take 7.5 mg by mouth daily., Disp: , Rfl:    methocarbamol (ROBAXIN) 500 MG tablet, TAKE ONE TABLET BY MOUTH THREE TIMES A DAY AS NEEDED FOR MUSCLE SPASMS (SEDATION PRECAUTIONS), Disp: 30 tablet, Rfl: 0   omeprazole (PRILOSEC) 40 MG capsule, Take 40 mg by mouth in the morning and at bedtime., Disp: , Rfl:    ondansetron (ZOFRAN) 4 MG tablet, TAKE ONE TABLET BY MOUTH EVERY 8 HOURS AS NEEDED FOR NAUSEA AND VOMITING, Disp: 20 tablet, Rfl: 0   sucralfate (CARAFATE) 1 g tablet, Take 1 tablet (1 g total) by mouth 3 (three) times daily with meals as needed (reflux)., Disp: 60 tablet, Rfl: 0   triamcinolone cream (KENALOG) 0.1 %, Apply 1 Application topically daily as needed (itching)., Disp: , Rfl:    valACYclovir (VALTREX) 500 MG tablet, Take one tablet (500 mg) by mouth twice a day for 3 days as needed for a genital outbreak.  Take 4 tablets (2000 mg) by mouth twice a day for 1 day as needed for an oral outbreak., Disp: 30 tablet, Rfl: 1  Observations/Objective: Video camera failed on patient end No audible distress No labored breathing.  Speech is clear and coherent with logical content.  Patient is alert and oriented at baseline.    Assessment and Plan: 1. COVID-19 (Primary)   With improving symptoms and having been sick for about a week, I don't think that anti-viral treatment is indicated. I've advised the patient to contact her orthopedic surgeon on Monday or Tuesday if not doing better to discuss if any action needs to be taken with regard to this illness and her upcoming surgery.  Follow Up Instructions: I discussed the assessment and treatment plan with the patient. The patient was provided an opportunity to ask  questions and all were answered. The patient agreed with the plan and demonstrated an understanding of the instructions.  A copy of instructions were sent to the patient via MyChart unless otherwise noted below.     The patient was advised to call back or seek an in-person evaluation if the symptoms worsen or if the condition fails to improve as anticipated.    [name], PA-C

## 2023-11-07 ENCOUNTER — Emergency Department: Payer: PPO

## 2023-11-07 ENCOUNTER — Other Ambulatory Visit: Payer: Self-pay

## 2023-11-07 ENCOUNTER — Inpatient Hospital Stay
Admission: EM | Admit: 2023-11-07 | Discharge: 2023-11-09 | DRG: 391 | Disposition: A | Discharge status: Home / Self Care | Payer: PPO | Attending: Internal Medicine | Admitting: Internal Medicine

## 2023-11-07 DIAGNOSIS — F419 Anxiety disorder, unspecified: Secondary | ICD-10-CM | POA: Diagnosis present

## 2023-11-07 DIAGNOSIS — M1610 Unilateral primary osteoarthritis, unspecified hip: Secondary | ICD-10-CM | POA: Diagnosis present

## 2023-11-07 DIAGNOSIS — Z83438 Family history of other disorder of lipoprotein metabolism and other lipidemia: Secondary | ICD-10-CM

## 2023-11-07 DIAGNOSIS — Z833 Family history of diabetes mellitus: Secondary | ICD-10-CM

## 2023-11-07 DIAGNOSIS — U071 COVID-19: Secondary | ICD-10-CM | POA: Diagnosis not present

## 2023-11-07 DIAGNOSIS — I2510 Atherosclerotic heart disease of native coronary artery without angina pectoris: Secondary | ICD-10-CM | POA: Diagnosis not present

## 2023-11-07 DIAGNOSIS — Z7982 Long term (current) use of aspirin: Secondary | ICD-10-CM | POA: Diagnosis not present

## 2023-11-07 DIAGNOSIS — N3000 Acute cystitis without hematuria: Secondary | ICD-10-CM

## 2023-11-07 DIAGNOSIS — I10 Essential (primary) hypertension: Secondary | ICD-10-CM | POA: Diagnosis present

## 2023-11-07 DIAGNOSIS — K219 Gastro-esophageal reflux disease without esophagitis: Secondary | ICD-10-CM | POA: Diagnosis not present

## 2023-11-07 DIAGNOSIS — M169 Osteoarthritis of hip, unspecified: Secondary | ICD-10-CM | POA: Diagnosis present

## 2023-11-07 DIAGNOSIS — Z823 Family history of stroke: Secondary | ICD-10-CM

## 2023-11-07 DIAGNOSIS — I959 Hypotension, unspecified: Secondary | ICD-10-CM

## 2023-11-07 DIAGNOSIS — Z87440 Personal history of urinary (tract) infections: Secondary | ICD-10-CM

## 2023-11-07 DIAGNOSIS — Z882 Allergy status to sulfonamides status: Secondary | ICD-10-CM | POA: Diagnosis not present

## 2023-11-07 DIAGNOSIS — Z881 Allergy status to other antibiotic agents status: Secondary | ICD-10-CM

## 2023-11-07 DIAGNOSIS — Z818 Family history of other mental and behavioral disorders: Secondary | ICD-10-CM

## 2023-11-07 DIAGNOSIS — Z800 Family history of malignant neoplasm of digestive organs: Secondary | ICD-10-CM

## 2023-11-07 DIAGNOSIS — Z79899 Other long term (current) drug therapy: Secondary | ICD-10-CM

## 2023-11-07 DIAGNOSIS — E785 Hyperlipidemia, unspecified: Secondary | ICD-10-CM | POA: Diagnosis present

## 2023-11-07 DIAGNOSIS — Z8616 Personal history of COVID-19: Secondary | ICD-10-CM | POA: Diagnosis not present

## 2023-11-07 DIAGNOSIS — Z885 Allergy status to narcotic agent status: Secondary | ICD-10-CM

## 2023-11-07 DIAGNOSIS — N3090 Cystitis, unspecified without hematuria: Secondary | ICD-10-CM | POA: Diagnosis not present

## 2023-11-07 DIAGNOSIS — K760 Fatty (change of) liver, not elsewhere classified: Secondary | ICD-10-CM | POA: Diagnosis not present

## 2023-11-07 DIAGNOSIS — K5732 Diverticulitis of large intestine without perforation or abscess without bleeding: Secondary | ICD-10-CM | POA: Diagnosis not present

## 2023-11-07 DIAGNOSIS — Z807 Family history of other malignant neoplasms of lymphoid, hematopoietic and related tissues: Secondary | ICD-10-CM

## 2023-11-07 DIAGNOSIS — R1032 Left lower quadrant pain: Secondary | ICD-10-CM | POA: Diagnosis not present

## 2023-11-07 DIAGNOSIS — Z981 Arthrodesis status: Secondary | ICD-10-CM

## 2023-11-07 DIAGNOSIS — Z79818 Long term (current) use of other agents affecting estrogen receptors and estrogen levels: Secondary | ICD-10-CM

## 2023-11-07 DIAGNOSIS — K529 Noninfective gastroenteritis and colitis, unspecified: Secondary | ICD-10-CM | POA: Diagnosis not present

## 2023-11-07 DIAGNOSIS — E860 Dehydration: Secondary | ICD-10-CM | POA: Diagnosis present

## 2023-11-07 DIAGNOSIS — Z820 Family history of epilepsy and other diseases of the nervous system: Secondary | ICD-10-CM

## 2023-11-07 DIAGNOSIS — Z888 Allergy status to other drugs, medicaments and biological substances status: Secondary | ICD-10-CM | POA: Diagnosis not present

## 2023-11-07 DIAGNOSIS — Z85828 Personal history of other malignant neoplasm of skin: Secondary | ICD-10-CM

## 2023-11-07 DIAGNOSIS — I739 Peripheral vascular disease, unspecified: Secondary | ICD-10-CM | POA: Diagnosis not present

## 2023-11-07 DIAGNOSIS — Z8249 Family history of ischemic heart disease and other diseases of the circulatory system: Secondary | ICD-10-CM | POA: Diagnosis not present

## 2023-11-07 DIAGNOSIS — R6510 Systemic inflammatory response syndrome (SIRS) of non-infectious origin without acute organ dysfunction: Secondary | ICD-10-CM | POA: Diagnosis present

## 2023-11-07 HISTORY — DX: COVID-19: U07.1

## 2023-11-07 LAB — CBC
HCT: 37.3 % (ref 36.0–46.0)
Hemoglobin: 12.1 g/dL (ref 12.0–15.0)
MCH: 27.9 pg (ref 26.0–34.0)
MCHC: 32.4 g/dL (ref 30.0–36.0)
MCV: 86.1 fL (ref 80.0–100.0)
Platelets: 152 10*3/uL (ref 150–400)
RBC: 4.33 MIL/uL (ref 3.87–5.11)
RDW: 13.2 % (ref 11.5–15.5)
WBC: 12.2 10*3/uL — ABNORMAL HIGH (ref 4.0–10.5)
nRBC: 0 % (ref 0.0–0.2)

## 2023-11-07 LAB — URINALYSIS, ROUTINE W REFLEX MICROSCOPIC
Bilirubin Urine: NEGATIVE
Glucose, UA: NEGATIVE mg/dL
Ketones, ur: 5 mg/dL — AB
Nitrite: NEGATIVE
Protein, ur: 100 mg/dL — AB
Specific Gravity, Urine: 1.027 (ref 1.005–1.030)
WBC, UA: >50 WBC/hpf (ref 0–5)
pH: 5 (ref 5.0–8.0)

## 2023-11-07 LAB — COMPREHENSIVE METABOLIC PANEL
ALT: 12 U/L (ref 0–44)
AST: 16 U/L (ref 15–41)
Albumin: 3.7 g/dL (ref 3.5–5.0)
Alkaline Phosphatase: 93 U/L (ref 38–126)
Anion gap: 12 (ref 5–15)
BUN: 21 mg/dL (ref 8–23)
CO2: 23 mmol/L (ref 22–32)
Calcium: 9.9 mg/dL (ref 8.9–10.3)
Chloride: 99 mmol/L (ref 98–111)
Creatinine, Ser: 0.93 mg/dL (ref 0.44–1.00)
GFR, Estimated: >60 mL/min (ref >60)
Glucose, Bld: 115 mg/dL — ABNORMAL HIGH (ref 70–99)
Potassium: 3.9 mmol/L (ref 3.5–5.1)
Sodium: 134 mmol/L — ABNORMAL LOW (ref 135–145)
Total Bilirubin: 0.7 mg/dL (ref 0.0–1.2)
Total Protein: 7 g/dL (ref 6.5–8.1)

## 2023-11-07 LAB — LIPASE, BLOOD: Lipase: 24 U/L (ref 11–51)

## 2023-11-07 MED ORDER — ALPRAZOLAM 0.5 MG PO TABS
0.5000 mg | ORAL_TABLET | Freq: Two times a day (BID) | ORAL | Status: DC | PRN
  Administered 2023-11-08 (×2): 0.5 mg via ORAL
  Filled 2023-11-07 (×2): qty 1

## 2023-11-07 MED ORDER — ONDANSETRON HCL 4 MG/2ML IJ SOLN: 4.0000 mg | Freq: Four times a day (QID) | INTRAMUSCULAR | Status: DC | PRN

## 2023-11-07 MED ORDER — ASPIRIN 81 MG PO TBEC
81.0000 mg | DELAYED_RELEASE_TABLET | Freq: Every day | ORAL | Status: DC
  Administered 2023-11-08 – 2023-11-09 (×2): 81 mg via ORAL
  Filled 2023-11-07 (×2): qty 1

## 2023-11-07 MED ORDER — ATORVASTATIN CALCIUM 10 MG PO TABS
10.0000 mg | ORAL_TABLET | Freq: Every day | ORAL | Status: DC
  Administered 2023-11-08 – 2023-11-09 (×2): 10 mg via ORAL
  Filled 2023-11-07 (×2): qty 1

## 2023-11-07 MED ORDER — ACETAMINOPHEN 325 MG PO TABS
650.0000 mg | ORAL_TABLET | Freq: Four times a day (QID) | ORAL | Status: DC | PRN
  Administered 2023-11-08 (×2): 650 mg via ORAL
  Filled 2023-11-07 (×2): qty 2

## 2023-11-07 MED ORDER — GABAPENTIN 300 MG PO CAPS
300.0000 mg | ORAL_CAPSULE | Freq: Every evening | ORAL | Status: DC | PRN
  Filled 2023-11-07: qty 1

## 2023-11-07 MED ORDER — ACETAMINOPHEN 650 MG RE SUPP
650.0000 mg | Freq: Four times a day (QID) | RECTAL | Status: DC | PRN
Start: 2023-11-07 — End: 2023-11-09

## 2023-11-07 MED ORDER — CEFTRIAXONE SODIUM 1 G IJ SOLR
1.0000 g | Freq: Once | INTRAMUSCULAR | Status: AC
  Administered 2023-11-07: 1 g via INTRAVENOUS
  Filled 2023-11-07: qty 10

## 2023-11-07 MED ORDER — ONDANSETRON HCL 4 MG PO TABS: 4.0000 mg | ORAL_TABLET | Freq: Four times a day (QID) | ORAL | Status: DC | PRN

## 2023-11-07 MED ORDER — SODIUM CHLORIDE 0.9 % IV BOLUS
500.0000 mL | Freq: Once | INTRAVENOUS | Status: AC
Start: 2023-11-07 — End: 2023-11-07
  Administered 2023-11-07: 500 mL via INTRAVENOUS

## 2023-11-07 MED ORDER — PANTOPRAZOLE SODIUM 40 MG PO TBEC
40.0000 mg | DELAYED_RELEASE_TABLET | Freq: Every day | ORAL | Status: DC
  Administered 2023-11-08 – 2023-11-09 (×2): 40 mg via ORAL
  Filled 2023-11-07 (×2): qty 1

## 2023-11-07 MED ORDER — SODIUM CHLORIDE 0.9 % IV BOLUS
500.0000 mL | Freq: Once | INTRAVENOUS | Status: AC
  Administered 2023-11-07: 500 mL via INTRAVENOUS

## 2023-11-07 MED ORDER — IOHEXOL 300 MG/ML  SOLN
100.0000 mL | Freq: Once | INTRAMUSCULAR | Status: AC | PRN
  Administered 2023-11-07: 100 mL via INTRAVENOUS

## 2023-11-07 NOTE — Assessment & Plan Note (Addendum)
Has hip replacement scheduled for January 27 No acute issues

## 2023-11-07 NOTE — ED Provider Notes (Signed)
[facility] Provider Note    Event Date/Time   First MD Initiated Contact with Patient 11/07/23 1525     (approximate)   History   Abdominal Pain and covid+   HPI  Bianca Fry is a 78 y.o. female with a history of colitis, GERD, hiatal hernia, hypertension and hyperlipidemia who presents with lower abdominal pain over the last couple of days, mainly in the left lower quadrant but somewhat spreading bilaterally.  She reports associated diarrhea since last night that had a tinge of blood during one of the bowel movements.  She states that the symptoms overall feel similar to when she has had colitis.  She reports some nausea and feeling weak and dehydrated but denies any vomiting.  She has no fever or chills.  She also has some discomfort with urination and thinks she has UTI.  I reviewed the past medical records.  The patient's most recent outpatient encounter was on 1/17 for an urgent care virtual visit at which time the patient reported testing positive for COVID the night before   Physical Exam   Triage Vital Signs: ED Triage Vitals [11/07/23 1349]  Encounter Vitals Group     BP (!) 105/51     Systolic BP Percentile      Diastolic BP Percentile      Pulse Rate 91     Resp 18     Temp 98 F (36.7 C)     Temp src      SpO2 100 %     Weight 140 lb (63.5 kg)     Height 5\' 4"  (1.626 m)     Head Circumference      Peak Flow      Pain Score 5     Pain Loc      Pain Education      Exclude from Growth Chart     Most recent vital signs: Vitals:   11/07/23 1349  BP: (!) 105/51  Pulse: 91  Resp: 18  Temp: 98 F (36.7 C)  SpO2: 100%     General: Awake, no distress.  CV:  Good peripheral perfusion.  Resp:  Normal effort.  Abd:  Soft with moderate left lower quadrant tenderness.  No peritoneal signs.  No distention.  Other:  Slightly dry mucous membranes.   ED Results / Procedures / Treatments   Labs (all labs ordered are listed, but  only abnormal results are displayed) Labs Reviewed  COMPREHENSIVE METABOLIC PANEL - Abnormal; Notable for the following components:      Result Value   Sodium 134 (*)    Glucose, Bld 115 (*)    All other components within normal limits  CBC - Abnormal; Notable for the following components:   WBC 12.2 (*)    All other components within normal limits  URINALYSIS, ROUTINE W REFLEX MICROSCOPIC - Abnormal; Notable for the following components:   Color, Urine AMBER (*)    APPearance CLOUDY (*)    Hgb urine dipstick SMALL (*)    Ketones, ur 5 (*)    Protein, ur 100 (*)    Leukocytes,Ua LARGE (*)    Bacteria, UA RARE (*)    All other components within normal limits  LIPASE, BLOOD  LACTIC ACID, PLASMA  LACTIC ACID, PLASMA     EKG     RADIOLOGY  CT abdomen/pelvis: I independently viewed and interpreted the images; there are no dilated bowels or any free air or free fluid.  Radiology  report indicates the following:  IMPRESSION:  1. Colitis/diverticulitis of the distal descending colon and  proximal sigmoid colon.  2. Mucosal hyperenhancement and wall thickening of the bladder which  is nondistended. Correlate with urinalysis to exclude cystitis.  3. Hepatic steatosis.    Aortic Atherosclerosis (ICD10-I70.0).     PROCEDURES:  Critical Care performed: No  Procedures   MEDICATIONS ORDERED IN ED: Medications  sodium chloride 0.9 % bolus 500 mL (has no administration in time range)  cefTRIAXone (ROCEPHIN) 1 g in sodium chloride 0.9 % 100 mL IVPB (has no administration in time range)  iohexol (OMNIPAQUE) 300 MG/ML solution 100 mL (100 mLs Intravenous Contrast Given 11/07/23 1858)     IMPRESSION / MDM / ASSESSMENT AND PLAN / ED COURSE  I reviewed the triage vital signs and the nursing notes.  78 year old female with PMH as noted above presents with left lower quadrant abdominal pain over the last couple of days associated with diarrhea with a tinge of blood and nausea.   She also has some urinary discomfort.  On exam the patient is tender in the left lower quadrant.  Initial lab workup reveals a WBC count of 12.2.  Electrolytes, LFTs, and lipase are normal.  Urinalysis does show significant WBCs and other findings suggestive of possible UTI.  Differential diagnosis includes, but is not limited to, UTI, diverticulitis, colitis, gastroenteritis, mesenteric adenitis.  Given the patient's age and comorbidities we will obtain a CT for further evaluation as well as giving IV fluids.  Patient's presentation is most consistent with acute presentation with potential threat to life or bodily function.  ----------------------------------------- 8:04 PM on 11/07/2023 -----------------------------------------  CT shows evidence of colitis as well as acute cystitis.  Although the patient is overall relatively well-appearing and is not septic, given her history of ischemic colitis I feel that she would benefit from inpatient admission for IV hydration and further monitoring and treatment.  She has numerous antibiotic allergies and intolerances.  She cannot take penicillins due to rash.  She is able to take Cipro but not Flagyl.  She can tolerate cephalosporins.  He also appears that during her last admission for colitis in December 2023, she received a dose of meropenem in the ED, but subsequent antibiotics were withdrawn as was thought to be more ischemic in nature.  She was treated with fluids and has not required a bowel resection.  I have ordered IV fluids well as IV ceftriaxone to cover for the UTI..  I have added on a lactate.  Overall my suspicion for acute ischemia is low given patient's normal vital signs, reassuring labs, and lack of peritoneal signs on exam.  I consulted Dr. Para March from the hospitalist service; based on our discussion she agrees to evaluate the patient for admission.   FINAL CLINICAL IMPRESSION(S) / ED DIAGNOSES   Final diagnoses:  Colitis   Cystitis     Rx / DC Orders   ED Discharge Orders     None        Note:  This document was prepared using Dragon voice recognition software and may include unintentional dictation errors.    Dionne Bucy, MD 11/07/23 2006

## 2023-11-07 NOTE — Assessment & Plan Note (Addendum)
Holding losartan and carvedilol until BP improves

## 2023-11-07 NOTE — Assessment & Plan Note (Signed)
Continue alprazolam 
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
## 2023-11-07 NOTE — Assessment & Plan Note (Signed)
Tested positive for COVID on 1/16 History of adverse effect to Paxlovid Supportive care and symptom control

## 2023-11-07 NOTE — ED Triage Notes (Signed)
Pt comes with c/o colitis. Pt states belly pain for few days. Pt states she was also dx on Thursday with Covid. Pt states pain across belly last night. Pt states she then had diarrhea last night with little blood.

## 2023-11-07 NOTE — Assessment & Plan Note (Addendum)
Essential hypertension Soft BP 105/51 on arrival Received a fluid bolus in the ED Hold home losartan and carvedilol and resume as appropriate

## 2023-11-07 NOTE — Assessment & Plan Note (Addendum)
SIRS History of ischemic colitis 09/2022 Sepsis criteria include tachycardia, hypotension and leukocytosis--monitor for sepsis Supportive care, pain control, antiemetics Clear liquid diet Monitor for further diarrhea, further bleeding Serial H&H, GI panel, follow lactic acid Continue GI consult if ongoing symptoms-last colonoscopy was 2022 and showed a mucosal ulceration, internal hemorrhoids and diverticulosis

## 2023-11-07 NOTE — Assessment & Plan Note (Signed)
Continue Rocephin Follow cultures 

## 2023-11-07 NOTE — Assessment & Plan Note (Signed)
No complaints of chest pain Continue losartan, carvedilol atorvastatin and aspirin

## 2023-11-08 ENCOUNTER — Inpatient Hospital Stay: Admission: RE | Admit: 2023-11-08 | Payer: PPO | Source: Ambulatory Visit

## 2023-11-08 ENCOUNTER — Other Ambulatory Visit: Payer: Self-pay

## 2023-11-08 DIAGNOSIS — K529 Noninfective gastroenteritis and colitis, unspecified: Secondary | ICD-10-CM | POA: Diagnosis not present

## 2023-11-08 LAB — BASIC METABOLIC PANEL
Anion gap: 9 (ref 5–15)
BUN: 20 mg/dL (ref 8–23)
CO2: 21 mmol/L — ABNORMAL LOW (ref 22–32)
Calcium: 9.1 mg/dL (ref 8.9–10.3)
Chloride: 107 mmol/L (ref 98–111)
Creatinine, Ser: 0.66 mg/dL (ref 0.44–1.00)
GFR, Estimated: >60 mL/min (ref >60)
Glucose, Bld: 92 mg/dL (ref 70–99)
Potassium: 3.8 mmol/L (ref 3.5–5.1)
Sodium: 137 mmol/L (ref 135–145)

## 2023-11-08 LAB — CBC
HCT: 32.7 % — ABNORMAL LOW (ref 36.0–46.0)
Hemoglobin: 10.5 g/dL — ABNORMAL LOW (ref 12.0–15.0)
MCH: 28.4 pg (ref 26.0–34.0)
MCHC: 32.1 g/dL (ref 30.0–36.0)
MCV: 88.4 fL (ref 80.0–100.0)
Platelets: 115 10*3/uL — ABNORMAL LOW (ref 150–400)
RBC: 3.7 MIL/uL — ABNORMAL LOW (ref 3.87–5.11)
RDW: 13.3 % (ref 11.5–15.5)
WBC: 7.4 10*3/uL (ref 4.0–10.5)
nRBC: 0 % (ref 0.0–0.2)

## 2023-11-08 LAB — LACTIC ACID, PLASMA: Lactic Acid, Venous: 1 mmol/L (ref 0.5–1.9)

## 2023-11-08 MED ORDER — OYSTER SHELL CALCIUM/D3 500-5 MG-MCG PO TABS
1.0000 | ORAL_TABLET | Freq: Every day | ORAL | Status: DC
  Administered 2023-11-08 – 2023-11-09 (×2): 1 via ORAL
  Filled 2023-11-08 (×2): qty 1

## 2023-11-08 MED ORDER — LORATADINE 10 MG PO TABS
10.0000 mg | ORAL_TABLET | Freq: Every day | ORAL | Status: DC
  Administered 2023-11-09: 10 mg via ORAL
  Filled 2023-11-08: qty 1

## 2023-11-08 MED ORDER — CARVEDILOL 6.25 MG PO TABS
6.2500 mg | ORAL_TABLET | Freq: Two times a day (BID) | ORAL | Status: DC
  Administered 2023-11-08 – 2023-11-09 (×2): 6.25 mg via ORAL
  Filled 2023-11-08 (×2): qty 1

## 2023-11-08 MED ORDER — MENTHOL 3 MG MT LOZG
1.0000 | LOZENGE | OROMUCOSAL | Status: DC | PRN
  Filled 2023-11-08: qty 9

## 2023-11-08 MED ORDER — PIPERACILLIN-TAZOBACTAM 3.375 G IVPB
3.3750 g | Freq: Three times a day (TID) | INTRAVENOUS | Status: DC
  Administered 2023-11-08 – 2023-11-09 (×4): 3.375 g via INTRAVENOUS
  Filled 2023-11-08 (×4): qty 50

## 2023-11-08 MED ORDER — SODIUM CHLORIDE 0.9 % IV SOLN: 1.0000 g | INTRAVENOUS | Status: DC

## 2023-11-08 MED ORDER — ESTRADIOL 0.1 MG/GM VA CREA
1.0000 | TOPICAL_CREAM | VAGINAL | Status: DC
  Filled 2023-11-08: qty 42.5

## 2023-11-08 MED ORDER — RISAQUAD PO CAPS
1.0000 | ORAL_CAPSULE | Freq: Every day | ORAL | Status: DC
  Administered 2023-11-08 – 2023-11-09 (×2): 1 via ORAL
  Filled 2023-11-08 (×2): qty 1

## 2023-11-08 MED ORDER — LOSARTAN POTASSIUM 50 MG PO TABS
75.0000 mg | ORAL_TABLET | Freq: Every day | ORAL | Status: DC
  Administered 2023-11-08 – 2023-11-09 (×2): 75 mg via ORAL
  Filled 2023-11-08: qty 1
  Filled 2023-11-08: qty 2

## 2023-11-08 NOTE — Progress Notes (Signed)
Progress Note   Patient: Bianca Fry EXB:[id number] DOB: July 16, 1946 DOA: 11/07/2023     1 DOS: the patient was seen and examined on 11/08/2023   Brief hospital course: Bianca Fry is a 78 y.o. female with medical history significant for ischemic colitis, hypertension, hyperlipidemia,  anxiety, CAD, PVD, last admitted for ischemic colitis in December 2023, who presents with a 2-day history of left lower quadrant abdominal pain, then an episode of diarrhea with blood streaks, the night before arrival.  Symptoms feel similar to when she was admitted back in December 2023. No diarrhea since arrival to the ED. She endorses nausea without vomiting and denies fever or chills.  She also reports burning on urination.  Separately, patient had been having reflux symptoms and coughing for the past few days and tested positive for COVID on 1/16.  She had a telephone visit with her PCP on 1/17 and was prescribed PPI and Carafate which she has not yet started.  Additionally she has upcoming hip replacement on 11/15/2023 ED course and data review: Soft BP at 105/51 with pulse 91 otherwise normal vitals Labs with leukocytosis of 12,000, normal hemoglobin of 12.  Lactic acid pending.  CMP overall unremarkable with normal lipase and LFTs.  Urinalysis with large leukocytes and rare bacteria CT abdomen and pelvis showing colitis and cystitis as follows IMPRESSION: 1. Colitis/diverticulitis of the distal descending colon and proximal sigmoid colon. 2. Mucosal hyperenhancement and wall thickening of the bladder which is nondistended. Correlate with urinalysis to exclude cystitis. 3. Hepatic steatosis.     Patient given a fluid bolus and started on ceftriaxone Hospitalist consulted for admission.    Assessment and Plan:  * Acute colitis SIRS History of ischemic colitis 09/2022 Sepsis criteria include tachycardia, hypotension and leukocytosis present on admission Imaging shows colitis/diverticulitis of the  distal descending colon and proximal sigmoid colon.  Mucosal hyperenhancement and wall thickening of the bladder which is nondistended.  Correlate with UA to exclude cystitis. Leukocytosis has improved Supportive care, pain control, antiemetics Clear liquid diet Serial H&H, GI panel, follow lactic acid Continue GI consult if ongoing symptoms-last colonoscopy was 2022 and showed a mucosal ulceration, internal hemorrhoids and diverticulosis Continue empiric antibiotic therapy with Zosyn   Hypotension Essential hypertension Improved blood pressure following hydration Will resume antihypertensive medications Resume carvedilol and losartan   Acute cystitis Noted to have pyuria Continue empiric antibiotic therapy with Zosyn Follow cultures      COVID-19 virus infection Tested positive for COVID on 1/16 History of adverse effect to Paxlovid Supportive care and symptom control   Osteoarthritis of hip Has hip replacement scheduled for January 27 No acute issues   CAD (coronary artery disease) No complaints of chest pain Continue losartan, carvedilol, atorvastatin and aspirin   Anxiety Continue alprazolam             Subjective: Continues to complain of left lower quadrant pain.  Able to tolerate liquid diet  Physical Exam: Vitals:   11/08/23 0214 11/08/23 0615 11/08/23 0734 11/08/23 1007  BP: (!) 155/66 (!) 150/70  (!) 148/72  Pulse: 67 70  71  Resp: 16 18  18   Temp: 97.6 F (36.4 C)  97.7 F (36.5 C) 98 F (36.7 C)  TempSrc: Oral  Oral   SpO2: 95% 97%  98%  Weight:      Height:       Vitals and nursing note reviewed.  Constitutional:      General: She is not in acute distress. HENT:  Head: Normocephalic and atraumatic.  Cardiovascular:     Rate and Rhythm: Normal rate and regular rhythm.     Heart sounds: Normal heart sounds.  Pulmonary:     Effort: Pulmonary effort is normal.     Breath sounds: Normal breath sounds.  Abdominal:     Palpations:  Abdomen is soft.     Tenderness: There is abdominal tenderness in the left lower quadrant.  Neurological:     Mental Status: Mental status is at baseline.    Data Reviewed: Labs reviewed.  Hemoglobin 10.5, white count 7.4 There are no new results to review at this time.  Family Communication: Plan of care discussed with patient and her husband at the bedside  Disposition: Status is: Inpatient Remains inpatient appropriate because: On IV antibiotics for diverticulitis  Planned Discharge Destination: Home    Time spent: 36 minutes  Author: Lucile Shutters, MD 11/08/2023 3:02 PM  For on call review [email or web address].

## 2023-11-08 NOTE — ED Notes (Signed)
Pt has been up ambulating to bathroom w/o diff  Family as been at bedside

## 2023-11-08 NOTE — ED Notes (Signed)
This RN to bedside to introduce self to pt. Pt is caox4, in no acute distress. Pt vitals an charting behind and needs to be updated. This RN updated all vitals.

## 2023-11-08 NOTE — Progress Notes (Signed)
Pharmacy Antibiotic Note  Bianca Fry is a 78 y.o. female admitted on 11/07/2023 with  intra-abdominal infection .  Patient presented to ED with 2-day history of left lower quadrant abdominal pain followed by an episode of diarrhea with blood streaks the night before arrival. Patient endorses nausea w/o vomiting and denies fever or chills. In ED, BP 105/51, HR 91, lactic acid 1, and WBC 12. Pharmacy has been consulted for Zosyn dosing.  WBC 12.2>7.4 Afebrile  Scr 0.93>0.66  1/19 CT A/P: Colitis/diverticulitis of the distal descending colon and proximal sigmoid colon   Plan: Start Zosyn 3.375 g IV q8h Continue to monitor renal function, possible de-escalation, and duration of therapy  Height: 5\' 4"  (162.6 cm) Weight: 63.5 kg (140 lb) IBW/kg (Calculated) : 54.7  Temp (24hrs), Avg:97.8 F (36.6 C), Min:97.6 F (36.4 C), Max:98 F (36.7 C)  Recent Labs  Lab 11/07/23 1351 11/08/23 0211 11/08/23 0632  WBC 12.2*  --  7.4  CREATININE 0.93  --  0.66  LATICACIDVEN  --  1.0  --     Estimated Creatinine Clearance: 50 mL/min (by C-G formula based on SCr of 0.66 mg/dL).    Allergies  Allergen Reactions   Codeine Other (See Comments)    Chest pain   Nitrofurantoin Other (See Comments)    Chest pain - hospitalization   Hydrocodone-Acetaminophen Other (See Comments)   Oxycodone-Acetaminophen Other (See Comments)   Percocet [Oxycodone-Acetaminophen] Nausea And Vomiting   Propoxyphene Nausea And Vomiting   Propoxyphene Other (See Comments)   Vicodin [Hydrocodone-Acetaminophen] Nausea And Vomiting   Amlodipine Other (See Comments)    Pedal edema   Boniva [Ibandronic Acid] Diarrhea    Severe watery diarrhea - led to ER visit   Elmiron [Pentosan Polysulfate]     Chest pain, tremors, wheezing    Fosamax [Alendronate] Other (See Comments)    R hip pain   Hctz [Hydrochlorothiazide] Other (See Comments)    headache   Metronidazole Diarrhea, Nausea Only, Other (See Comments) and  Nausea And Vomiting   Paxlovid [Nirmatrelvir-Ritonavir] Diarrhea    Rectal bleeding    Vancomycin Diarrhea and Other (See Comments)    Severe abdominal pain   Clarithromycin Other (See Comments)    Abd. cramps   Penicillins Rash   Prednisone Other (See Comments)    Caused UTI   Sulfonamide Derivatives Other (See Comments)    GI upset    Antimicrobials this admission: 1/19 Ceftriaxone x 1 1/20 Zosyn >>   Microbiology results: None  Thank you for allowing pharmacy to be a part of this patient's care.  Merryl Hacker, PharmD Clinical Pharmacist  11/08/2023 9:14 AM

## 2023-11-09 ENCOUNTER — Other Ambulatory Visit: Payer: Self-pay

## 2023-11-09 DIAGNOSIS — K529 Noninfective gastroenteritis and colitis, unspecified: Secondary | ICD-10-CM | POA: Diagnosis not present

## 2023-11-09 DIAGNOSIS — R112 Nausea with vomiting, unspecified: Secondary | ICD-10-CM

## 2023-11-09 LAB — GASTROINTESTINAL PANEL BY PCR, STOOL (REPLACES STOOL CULTURE)

## 2023-11-09 MED ORDER — AMOXICILLIN-POT CLAVULANATE 875-125 MG PO TABS: 1.0000 | ORAL_TABLET | Freq: Two times a day (BID) | ORAL | 0 refills | Status: DC

## 2023-11-09 MED ORDER — CIPROFLOXACIN HCL 500 MG PO TABS: 500.0000 mg | ORAL_TABLET | Freq: Two times a day (BID) | ORAL | 0 refills | Status: AC

## 2023-11-09 NOTE — Telephone Encounter (Signed)
Zofran  Last rx:  09/29/23, #20  Last OV:  11/02/23, hosp f/u Next OV:  12/01/23, 6 mo f/u

## 2023-11-09 NOTE — Progress Notes (Signed)
Bianca Fry to be discharged Home per MD order. Discussed prescriptions and follow up appointments with the patient. Prescriptions given to patient, medication list explained in detail. Patient verbalized understanding.  Allergies as of 11/09/2023       Reactions   Codeine Other (See Comments)   Chest pain   Nitrofurantoin Other (See Comments)   Chest pain - hospitalization   Hydrocodone-acetaminophen Other (See Comments)   Oxycodone-acetaminophen Other (See Comments)   Percocet [oxycodone-acetaminophen] Nausea And Vomiting   Propoxyphene Nausea And Vomiting   Propoxyphene Other (See Comments)   Vicodin [hydrocodone-acetaminophen] Nausea And Vomiting   Amlodipine Other (See Comments)   Pedal edema   Boniva [ibandronic Acid] Diarrhea   Severe watery diarrhea - led to ER visit   Elmiron [pentosan Polysulfate]    Chest pain, tremors, wheezing    Fosamax [alendronate] Other (See Comments)   R hip pain   Hctz [hydrochlorothiazide] Other (See Comments)   headache   Metronidazole Diarrhea, Nausea Only, Other (See Comments), Nausea And Vomiting   Paxlovid [nirmatrelvir-ritonavir] Diarrhea   Rectal bleeding    Vancomycin Diarrhea, Other (See Comments)   Severe abdominal pain   Clarithromycin Other (See Comments)   Abd. cramps   Penicillins Rash   Prednisone Other (See Comments)   Caused UTI   Sulfonamide Derivatives Other (See Comments)   GI upset        Medication List     STOP taking these medications    esomeprazole 40 MG capsule Commonly known as: NEXIUM   meloxicam 7.5 MG tablet Commonly known as: MOBIC   triamcinolone cream 0.1 % Commonly known as: KENALOG       TAKE these medications    acidophilus Caps capsule Take 1 capsule by mouth daily.   ALPRAZolam 0.5 MG tablet Commonly known as: XANAX TAKE ONE TABLET BY MOUTH TWICE A DAY AS NEEDED FOR ANXIETY   aspirin EC 81 MG tablet Take 1 tablet (81 mg total) by mouth daily. Swallow whole.   atorvastatin  10 MG tablet Commonly known as: LIPITOR Take 1 tablet (10 mg total) by mouth daily.   B-12 1000 MCG Subl Place 1 tablet under the tongue every Monday, Wednesday, and Friday.   Calcium-Vitamin D 600-400 MG-UNIT Tabs Take 1 tablet by mouth daily.   carvedilol 6.25 MG tablet Commonly known as: COREG Take 1 tablet (6.25 mg total) by mouth 2 (two) times daily with a meal.   cholecalciferol 25 MCG (1000 UNIT) tablet Commonly known as: VITAMIN D3 Take 1,000 Units by mouth daily.   ciprofloxacin 500 MG tablet Commonly known as: Cipro Take 1 tablet (500 mg total) by mouth 2 (two) times daily for 5 days.   dicyclomine 10 MG capsule Commonly known as: BENTYL Take 1 capsule (10 mg total) by mouth 2 (two) times daily as needed for spasms.   EPINEPHrine 0.3 mg/0.3 mL Soaj injection Commonly known as: EPI-PEN Inject 0.3 mg into the muscle as needed for anaphylaxis.   estradiol 0.1 MG/GM vaginal cream Commonly known as: ESTRACE Use 1/2 g vaginally two or three times per week as needed to maintain symptom relief.   fexofenadine 180 MG tablet Commonly known as: ALLEGRA Take 180 mg by mouth daily as needed for allergies.   gabapentin 300 MG capsule Commonly known as: NEURONTIN Take 1 capsule (300 mg total) by mouth at bedtime.   losartan 50 MG tablet Commonly known as: COZAAR Take 1.5 tablets (75 mg total) by mouth daily.   methocarbamol 500 MG  tablet Commonly known as: ROBAXIN TAKE ONE TABLET BY MOUTH THREE TIMES A DAY AS NEEDED FOR MUSCLE SPASMS (SEDATION PRECAUTIONS)   omeprazole 40 MG capsule Commonly known as: PRILOSEC Take 40 mg by mouth in the morning and at bedtime.   ondansetron 4 MG tablet Commonly known as: ZOFRAN TAKE ONE TABLET BY MOUTH EVERY 8 HOURS AS NEEDED FOR NAUSEA AND VOMITING   sucralfate 1 g tablet Commonly known as: Carafate Take 1 tablet (1 g total) by mouth 3 (three) times daily with meals as needed (reflux).   valACYclovir 500 MG tablet Commonly  known as: VALTREX Take one tablet (500 mg) by mouth twice a day for 3 days as needed for a genital outbreak.  Take 4 tablets (2000 mg) by mouth twice a day for 1 day as needed for an oral outbreak.        Vitals:   11/09/23 0419 11/09/23 0836  BP: (!) 157/62 (!) 183/72  Pulse: 66 72  Resp: 20 15  Temp: (!) 97.5 F (36.4 C) 97.9 F (36.6 C)  SpO2: 95% 98%    Skin clean, dry and intact without evidence of skin break down and or skin tears. IV catheter discontinued intact. Site without signs and symptoms of complications. Dressing and pressure applied. Patient denies pain at this time. No complaints noted.  An After Visit Summary was printed and given to the patient. Patient escorted via wheelchair and discharged Home home via private auto.  Madie Reno, RN

## 2023-11-09 NOTE — Plan of Care (Signed)
  Problem: Activity: Goal: Risk for activity intolerance will decrease Outcome: Progressing   Problem: Nutrition: Goal: Adequate nutrition will be maintained Outcome: Progressing   Problem: Elimination: Goal: Will not experience complications related to bowel motility Outcome: Progressing Goal: Will not experience complications related to urinary retention Outcome: Progressing   Problem: Safety: Goal: Ability to remain free from injury will improve Outcome: Progressing

## 2023-11-09 NOTE — Discharge Summary (Addendum)
Physician Discharge Summary   Patient: Bianca Fry MRN: [id number] DOB: 10-Jan-1946  Admit date:     11/07/2023  Discharge date: 11/09/23  Discharge Physician: Jediah Horger   PCP: Eustaquio Boyden, MD   Recommendations at discharge:   Complete antibiotic therapy as recommended  Discharge Diagnoses: Principal Problem:   Acute colitis Active Problems:   Essential hypertension   Acute cystitis   Hypotension   COVID-19 virus infection   CAD (coronary artery disease)   Osteoarthritis of hip   Anxiety  Resolved Problems:   * No resolved hospital problems. *  Hospital Course:  Bianca Fry is a 78 y.o. female with medical history significant for ischemic colitis, hypertension, hyperlipidemia,  anxiety, CAD, PVD, last admitted for ischemic colitis in December 2023, who presents with a 2-day history of left lower quadrant abdominal pain, then an episode of diarrhea with blood streaks, the night before arrival.  Symptoms feel similar to when she was admitted back in December 2023. No diarrhea since arrival to the ED. She endorses nausea without vomiting and denies fever or chills.  She also reports burning on urination.  Separately, patient had been having reflux symptoms and coughing for the past few days and tested positive for COVID on 1/16.  She had a telephone visit with her PCP on 1/17 and was prescribed PPI and Carafate which she has not yet started.  Additionally she has upcoming hip replacement on 11/15/2023 ED course and data review: Soft BP at 105/51 with pulse 91 otherwise normal vitals Labs with leukocytosis of 12,000, normal hemoglobin of 12.  Lactic acid pending.  CMP overall unremarkable with normal lipase and LFTs.  Urinalysis with large leukocytes and rare bacteria CT abdomen and pelvis showing colitis and cystitis as follows IMPRESSION: 1. Colitis/diverticulitis of the distal descending colon and proximal sigmoid colon. 2. Mucosal hyperenhancement and wall thickening  of the bladder which is nondistended. Correlate with urinalysis to exclude cystitis. 3. Hepatic steatosis.    Assessment and Plan:  * Acute colitis SIRS History of ischemic colitis 09/2022 Sepsis criteria included tachycardia, hypotension and leukocytosis present on admission Imaging shows colitis/diverticulitis of the distal descending colon and proximal sigmoid colon.  Mucosal hyperenhancement and wall thickening of the bladder which is nondistended.  Correlate with UA Leukocytosis has improved Patient responded to supportive care, pain control, antiemetics and IV abx She was able to tolerate her clear liquid diet and was advanced to a regular diet which she tolerated She has been afebrile and will be discharged home to complete oral antibiotics    Hypotension Essential hypertension Improved blood pressure following hydration Continue carvedilol and losartan    Acute cystitis Noted to have pyuria Urine culture is pending Patient will be discharged on Augmentin       COVID-19 virus infection Tested positive for COVID on 1/16 History of adverse effect to Paxlovid Supportive care and symptom control    Osteoarthritis of hip Has hip replacement scheduled for January 27 No acute issues   CAD (coronary artery disease) No complaints of chest pain Continue losartan, carvedilol, atorvastatin and aspirin   Anxiety Continue alprazolam           Consultants: None Procedures performed: None  Disposition: Home Diet recommendation:  Discharge Diet Orders (From admission, onward)     Start     Ordered   11/09/23 0000  Diet - low sodium heart healthy        11/09/23 1415  Carb modified diet DISCHARGE MEDICATION: Allergies as of 11/09/2023       Reactions   Codeine Other (See Comments)   Chest pain   Nitrofurantoin Other (See Comments)   Chest pain - hospitalization   Hydrocodone-acetaminophen Other (See Comments)   Oxycodone-acetaminophen Other  (See Comments)   Percocet [oxycodone-acetaminophen] Nausea And Vomiting   Propoxyphene Nausea And Vomiting   Propoxyphene Other (See Comments)   Vicodin [hydrocodone-acetaminophen] Nausea And Vomiting   Amlodipine Other (See Comments)   Pedal edema   Boniva [ibandronic Acid] Diarrhea   Severe watery diarrhea - led to ER visit   Elmiron [pentosan Polysulfate]    Chest pain, tremors, wheezing    Fosamax [alendronate] Other (See Comments)   R hip pain   Hctz [hydrochlorothiazide] Other (See Comments)   headache   Metronidazole Diarrhea, Nausea Only, Other (See Comments), Nausea And Vomiting   Paxlovid [nirmatrelvir-ritonavir] Diarrhea   Rectal bleeding    Vancomycin Diarrhea, Other (See Comments)   Severe abdominal pain   Clarithromycin Other (See Comments)   Abd. cramps   Prednisone Other (See Comments)   Caused UTI   Sulfonamide Derivatives Other (See Comments)   GI upset        Medication List     STOP taking these medications    esomeprazole 40 MG capsule Commonly known as: NEXIUM   meloxicam 7.5 MG tablet Commonly known as: MOBIC   triamcinolone cream 0.1 % Commonly known as: KENALOG       TAKE these medications    acidophilus Caps capsule Take 1 capsule by mouth daily.   ALPRAZolam 0.5 MG tablet Commonly known as: XANAX TAKE ONE TABLET BY MOUTH TWICE A DAY AS NEEDED FOR ANXIETY   aspirin EC 81 MG tablet Take 1 tablet (81 mg total) by mouth daily. Swallow whole.   atorvastatin 10 MG tablet Commonly known as: LIPITOR Take 1 tablet (10 mg total) by mouth daily.   B-12 1000 MCG Subl Place 1 tablet under the tongue every Monday, Wednesday, and Friday.   Calcium-Vitamin D 600-400 MG-UNIT Tabs Take 1 tablet by mouth daily.   carvedilol 6.25 MG tablet Commonly known as: COREG Take 1 tablet (6.25 mg total) by mouth 2 (two) times daily with a meal.   cholecalciferol 25 MCG (1000 UNIT) tablet Commonly known as: VITAMIN D3 Take 1,000 Units by mouth  daily.   ciprofloxacin 500 MG tablet Commonly known as: Cipro Take 1 tablet (500 mg total) by mouth 2 (two) times daily for 5 days.   dicyclomine 10 MG capsule Commonly known as: BENTYL Take 1 capsule (10 mg total) by mouth 2 (two) times daily as needed for spasms.   EPINEPHrine 0.3 mg/0.3 mL Soaj injection Commonly known as: EPI-PEN Inject 0.3 mg into the muscle as needed for anaphylaxis.   estradiol 0.1 MG/GM vaginal cream Commonly known as: ESTRACE Use 1/2 g vaginally two or three times per week as needed to maintain symptom relief.   fexofenadine 180 MG tablet Commonly known as: ALLEGRA Take 180 mg by mouth daily as needed for allergies.   gabapentin 300 MG capsule Commonly known as: NEURONTIN Take 1 capsule (300 mg total) by mouth at bedtime.   losartan 50 MG tablet Commonly known as: COZAAR Take 1.5 tablets (75 mg total) by mouth daily.   methocarbamol 500 MG tablet Commonly known as: ROBAXIN TAKE ONE TABLET BY MOUTH THREE TIMES A DAY AS NEEDED FOR MUSCLE SPASMS (SEDATION PRECAUTIONS)   omeprazole 40 MG capsule Commonly known as:  PRILOSEC Take 40 mg by mouth in the morning and at bedtime.   ondansetron 4 MG tablet Commonly known as: ZOFRAN TAKE ONE TABLET BY MOUTH EVERY 8 HOURS AS NEEDED FOR NAUSEA AND VOMITING   sucralfate 1 g tablet Commonly known as: Carafate Take 1 tablet (1 g total) by mouth 3 (three) times daily with meals as needed (reflux).   valACYclovir 500 MG tablet Commonly known as: VALTREX Take one tablet (500 mg) by mouth twice a day for 3 days as needed for a genital outbreak.  Take 4 tablets (2000 mg) by mouth twice a day for 1 day as needed for an oral outbreak.        Follow-up Information     Eustaquio Boyden, MD. Go on 12/01/2023.   Specialty: Family Medicine Why: Go at 2:30pm. Contact information: [address] [place] [address] [phone]                Discharge Exam: Ceasar Mons Weights   11/07/23 1349  11/08/23 2159  Weight: 63.5 kg 62.9 kg   Vitals and nursing note reviewed.  Constitutional:      General: She is not in acute distress. HENT:     Head: Normocephalic and atraumatic.  Cardiovascular:     Rate and Rhythm: Normal rate and regular rhythm.     Heart sounds: Normal heart sounds.  Pulmonary:     Effort: Pulmonary effort is normal.     Breath sounds: Normal breath sounds.  Abdominal:     Palpations: Abdomen is soft.     Tenderness: Abdominal pain has resolved Neurological:     Mental Status: Mental status is at baseline.   Condition at discharge: stable  The results of significant diagnostics from this hospitalization (including imaging, microbiology, ancillary and laboratory) are listed below for reference.   Imaging Studies: CT ABDOMEN PELVIS W CONTRAST Result Date: 11/07/2023 CLINICAL DATA:  Left lower quadrant abdominal pain. Diarrhea last night with blood in it. EXAM: CT ABDOMEN AND PELVIS WITH CONTRAST TECHNIQUE: Multidetector CT imaging of the abdomen and pelvis was performed using the standard protocol following bolus administration of intravenous contrast. RADIATION DOSE REDUCTION: This exam was performed according to the departmental dose-optimization program which includes automated exposure control, adjustment of the mA and/or kV according to patient size and/or use of iterative reconstruction technique. CONTRAST:  OMNIPAQUE IOHEXOL 300 MG/ML  SOLN COMPARISON:  09/01/2023 FINDINGS: Lower chest: No acute abnormality. Hepatobiliary: Hepatic steatosis. Pneumobilia. Cholecystectomy. No biliary dilation. Pancreas: Unremarkable. Spleen: Unremarkable. Adrenals/Urinary Tract: Normal adrenal glands. Bilateral cortical renal scarring. No urinary calculi or hydronephrosis. Mucosal hyperenhancement and wall thickening of the bladder which is nondistended. Stomach/Bowel: Normal caliber large and small bowel. Diverticulosis. There is wall thickening and mild adjacent  inflammatory stranding about the distal descending colon proximal sigmoid colon. The appendix is not visualized.Stomach is within normal limits. Vascular/Lymphatic: Aortic atherosclerosis. No enlarged abdominal or pelvic lymph nodes. Reproductive: Hysterectomy. Other: No free intraperitoneal fluid or air.  No abscess. Musculoskeletal: No acute fracture.  Posterior fusion L4-L5. IMPRESSION: 1. Colitis/diverticulitis of the distal descending colon and proximal sigmoid colon. 2. Mucosal hyperenhancement and wall thickening of the bladder which is nondistended. Correlate with urinalysis to exclude cystitis. 3. Hepatic steatosis. Aortic Atherosclerosis (ICD10-I70.0). Electronically Signed   By: Minerva Fester M.D.   On: 11/07/2023 19:16   DG Chest 2 View Result Date: 11/01/2023 CLINICAL DATA:  78 year old female with chest pain acute onset 1 hour ago. Pain radiating to the left neck.  EXAM: CHEST - 2 VIEW COMPARISON:  CT Abdomen and Pelvis 11/16/2022. Chest radiographs 04/19/2021 and earlier. FINDINGS: PA and lateral views 0539 hours. Lower lung volumes compared to previous exams. Cardiac and mediastinal contours are stable and within normal limits. Visualized tracheal air column is within normal limits. Both lungs appear stable and clear. No pneumothorax or pleural effusion. No acute osseous abnormality identified. Abdominal Calcified aortic atherosclerosis. Negative visible bowel gas. IMPRESSION: No acute cardiopulmonary abnormality. Aortic Atherosclerosis (ICD10-I70.0). Electronically Signed   By: Odessa Fleming M.D.   On: 11/01/2023 05:51    Microbiology: Results for orders placed or performed during the hospital encounter of 11/07/23  Gastrointestinal Panel by PCR , Stool     Status: None   Collection Time: 11/09/23  6:42 AM   Specimen: Stool  Result Value Ref Range Status   Campylobacter species NOT DETECTED NOT DETECTED Final   Plesimonas shigelloides NOT DETECTED NOT DETECTED Final   Salmonella species NOT  DETECTED NOT DETECTED Final   Yersinia enterocolitica NOT DETECTED NOT DETECTED Final   Vibrio species NOT DETECTED NOT DETECTED Final   Vibrio cholerae NOT DETECTED NOT DETECTED Final   Enteroaggregative E coli (EAEC) NOT DETECTED NOT DETECTED Final   Enteropathogenic E coli (EPEC) NOT DETECTED NOT DETECTED Final   Enterotoxigenic E coli (ETEC) NOT DETECTED NOT DETECTED Final   Shiga like toxin producing E coli (STEC) NOT DETECTED NOT DETECTED Final   Shigella/Enteroinvasive E coli (EIEC) NOT DETECTED NOT DETECTED Final   Cryptosporidium NOT DETECTED NOT DETECTED Final   Cyclospora cayetanensis NOT DETECTED NOT DETECTED Final   Entamoeba histolytica NOT DETECTED NOT DETECTED Final   Giardia lamblia NOT DETECTED NOT DETECTED Final   Adenovirus F40/41 NOT DETECTED NOT DETECTED Final   Astrovirus NOT DETECTED NOT DETECTED Final   Norovirus GI/GII NOT DETECTED NOT DETECTED Final   Rotavirus A NOT DETECTED NOT DETECTED Final   Sapovirus (I, II, IV, and V) NOT DETECTED NOT DETECTED Final    Comment: Performed at [facility], [address] [place], [place] [address]    Labs: CBC: Recent Labs  Lab 11/07/23 1351 11/08/23 0632  WBC 12.2* 7.4  HGB 12.1 10.5*  HCT 37.3 32.7*  MCV 86.1 88.4  PLT 152 115*   Basic Metabolic Panel: Recent Labs  Lab 11/07/23 1351 11/08/23 0632  NA 134* 137  K 3.9 3.8  CL 99 107  CO2 23 21*  GLUCOSE 115* 92  BUN 21 20  CREATININE 0.93 0.66  CALCIUM 9.9 9.1   Liver Function Tests: Recent Labs  Lab 11/07/23 1351  AST 16  ALT 12  ALKPHOS 93  BILITOT 0.7  PROT 7.0  ALBUMIN 3.7   CBG: No results for input(s): "GLUCAP" in the last 168 hours.  Discharge time spent: greater than 30 minutes.  Signed: Lucile Shutters, MD [facility] 11/09/2023

## 2023-11-10 ENCOUNTER — Telehealth: Payer: Self-pay

## 2023-11-10 LAB — URINE CULTURE: Culture: <10000 — AB

## 2023-11-10 NOTE — Progress Notes (Deleted)
 Cardiology Clinic Note   Date: 11/10/2023 ID: Bianca Fry, DOB [birth year], MRN [id number]  Primary Cardiologist:  Julien Nordmann, MD  Patient Profile    Bianca Fry is a 78 y.o. female who presents to the clinic today for ***    Past medical history significant for:. Chronic chest pain. Palpitations. 14-day ZIO 11/27/2022: HR 55 to 222 bpm, average 77 bpm.  126 runs of SVT fastest 12 beats max rate 222 bpm, longest 16.6 seconds average 106 bpm.  Rare ectopy. Dyspnea. Echo 12/11/2022: EF 60 to 65%.  No RWMA.  Grade I DD.  Normal RV size/function.  Normal PA pressure, RVSP 25.7 mmHg.  Mild MR. PAD. Carotid artery stenosis/subclavian artery stenosis. Carotid duplex 12/11/2022: No evidence of stenosis right ICA.  Left ICA 1 to 39% stenosis.  Normal flow bilateral subclavian arteries. Hypertension. Hyperlipidemia. Lipid panel 05/21/2023: LDL 69, HDL 57, TG 122, total 151. GERD. Prediabetes. Ischemic colitis with GI bleeding.  In summary, patient was first evaluated by Dr. Mariah Milling on 02/12/2020 for chest pain at the request of Dr. Milinda Antis.  She was being followed for carotid artery/subclavian artery stenosis.  Carotid duplex February 2021 showed left ICA with 40 to 59% stenosis and normal flow bilateral subclavian arteries.  History of normal stress testing in 2012.  In January 2024 patient reported stable intermittent left-sided chest heaviness occurring randomly and not associated with exertion.  She also complained of intermittent dyspnea, palpitations, and flushing having several episodes a week lasting seconds.  She had recently had an episode that lasted a little longer and was associated with near syncope.  14-day ZIO showed runs of SVT as detailed above.  Echo showed normal LV/RV function as detailed above.     History of Present Illness    Bianca Fry is followed by Dr. Mariah Milling for the above outlined history.  Patient was last seen in the office by Dr. Mariah Milling on 12/16/2022 for  follow-up after testing.  She opted to defer further testing for atypical chest pain.  Carvedilol was increased for runs of SVT.  Patient presented to the ED on 11/01/2023 with complaints of chest pain.  EKG without acute changes.  Initial labs: Sodium 139, potassium 3.6, creatinine 0.76, BUN 14, WBC 7.9, hemoglobin 11.1.  Troponin negative x 2.  Chest x-ray with no acute cardiopulmonary abnormality.  Patient improved after IV Pepcid and was discharged home.  Patient presented to the ED on 11/07/2023 with complaints of colitis flare.  She was also diagnosed with COVID 3 days prior.  Initial labs: Sodium 134, potassium 3.9, creatinine 0.93, BUN 21, WBC 12.2, hemoglobin 12.1.  CT abdomen pelvis showed colitis/diverticulitis of the distal descending colon and proximal sigmoid colon as well as findings suspicious for cystitis.  Patient was treated with IV antibiotics and discharged on p.o. Augmentin.  Today, patient ***  Chest pain/dyspnea Echo February 2024 showed normal LV/RV function, Grade I DD, normal PA pressure, mild MR.  Patient*** -***  Palpitations 14-day ZIO February 2024 showed 126 runs of SVT.  Patient*** -Continue carvedilol.  Hypertension BP today*** -Continue losartan, carvedilol.  Carotid artery stenosis/subclavian artery stenosis Carotid duplex February 2024 showed no evidence of stenosis right ICA, 1 to 39% stenosis left ICA, normal flow bilateral subclavian arteries.  Patient denies*** -Schedule carotid artery duplex. -Continue aspirin, atorvastatin.  Hyperlipidemia LDL August 2024 69, at goal. -Continue atorvastatin.   ROS: All other systems reviewed and are otherwise negative except as noted in History of Present Illness.  EKGs/Labs  Reviewed        11/07/2023: ALT 12; AST 16 11/08/2023: BUN 20; Creatinine, Ser 0.66; Potassium 3.8; Sodium 137   11/08/2023: Hemoglobin 10.5; WBC 7.4   No results found for requested labs within last 365 days.   No results found for  requested labs within last 365 days.  ***  Risk Assessment/Calculations    {Does this patient have ATRIAL FIBRILLATION?:[phone]}          Physical Exam    VS:  LMP 10/20/1979 (Within Years)  , BMI There is no height or weight on file to calculate BMI.  GEN: Well nourished, well developed, in no acute distress. Neck: No JVD or carotid bruits. Cardiac: *** RRR. No murmurs. No rubs or gallops.   Respiratory:  Respirations regular and unlabored. Clear to auscultation without rales, wheezing or rhonchi. GI: Soft, nontender, nondistended. Extremities: Radials/DP/PT 2+ and equal bilaterally. No clubbing or cyanosis. No edema ***  Skin: Warm and dry, no rash. Neuro: Strength intact.  Assessment & Plan   ***  Disposition: ***     {Are you ordering a CV Procedure (e.g. stress test, cath, DCCV, TEE, etc)?   Press F2        :[id number]}   Signed, Etta Grandchild. Daine Gunther, DNP, NP-C

## 2023-11-10 NOTE — Telephone Encounter (Signed)
Noted. Will see then.  

## 2023-11-10 NOTE — Transitions of Care (Post Inpatient/ED Visit) (Signed)
11/10/2023  Name: Bianca Fry MRN: [id number] DOB: 08/20/1946  Today's TOC FU Call Status: Today's TOC FU Call Status:: Successful TOC FU Call Completed TOC FU Call Complete Date: 11/10/23 Patient's Name and Date of Birth confirmed.  Transition Care Management Follow-up Telephone Call Date of Discharge: 11/09/23 Discharge Facility: [facility]) Type of Discharge: Inpatient Admission Primary Inpatient Discharge Diagnosis:: gastroenteritis How have you been since you were released from the hospital?: Better Any questions or concerns?: No  Items Reviewed: Did you receive and understand the discharge instructions provided?: Yes Medications obtained,verified, and reconciled?: Yes (Medications Reviewed) Any new allergies since your discharge?: No Dietary orders reviewed?: Yes Do you have support at home?: Yes People in Home: spouse  Medications Reviewed Today: Medications Reviewed Today     Reviewed by Karena Addison, LPN (Licensed Practical Nurse) on 11/10/23 at [phone]  Med List Status: <None>   Medication Order Taking? Sig Documenting Provider Last Dose Status Informant  acidophilus (RISAQUAD) CAPS capsule [id number] No Take 1 capsule by mouth daily. [provider] 11/07/2023 Active Self  ALPRAZolam Prudy Feeler) 0.5 MG tablet [id number] No TAKE ONE TABLET BY MOUTH TWICE A DAY AS NEEDED FOR ANXIETY Eustaquio Boyden, MD 11/07/2023 Active Self  aspirin EC 81 MG tablet [id number] No Take 1 tablet (81 mg total) by mouth daily. Swallow whole. Eustaquio Boyden, MD 11/07/2023 Active Self  atorvastatin (LIPITOR) 10 MG tablet [id number] No Take 1 tablet (10 mg total) by mouth daily. Antonieta Iba, MD 11/07/2023 Active Self  Calcium Carb-Cholecalciferol (CALCIUM-VITAMIN D) 600-400 MG-UNIT TABS [id number] No Take 1 tablet by mouth daily. [provider] 11/07/2023 Active Self  carvedilol (COREG) 6.25 MG tablet [id number] No Take 1 tablet (6.25 mg total) by  mouth 2 (two) times daily with a meal. Antonieta Iba, MD 11/07/2023 Active Self  cholecalciferol (VITAMIN D) 25 MCG (1000 UNIT) tablet [id number] No Take 1,000 Units by mouth daily. [provider] 11/07/2023 Active Self  ciprofloxacin (CIPRO) 500 MG tablet [id number]  Take 1 tablet (500 mg total) by mouth 2 (two) times daily for 5 days. Lucile Shutters, MD  Active   Cyanocobalamin (B-12) 1000 MCG SUBL [id number] No Place 1 tablet under the tongue every Monday, Wednesday, and Friday. Eustaquio Boyden, MD 11/07/2023 Active Self  dicyclomine (BENTYL) 10 MG capsule [id number] No Take 1 capsule (10 mg total) by mouth 2 (two) times daily as needed for spasms. Eustaquio Boyden, MD 11/07/2023 Active Self  EPINEPHrine 0.3 mg/0.3 mL IJ SOAJ injection [id number] No Inject 0.3 mg into the muscle as needed for anaphylaxis. Eustaquio Boyden, MD Not Taking Active Self           Med Note Orvan Falconer Nov 08, 2023 12:04 PM) prn  estradiol (ESTRACE) 0.1 MG/GM vaginal cream [id number] No Use 1/2 g vaginally two or three times per week as needed to maintain symptom relief. Patton Salles, MD Past Week Active Self  fexofenadine (ALLEGRA) 180 MG tablet [id number] No Take 180 mg by mouth daily as needed for allergies. [provider] Taking Active Self           Med Note Aundria Rud, Jamse Belfast Nov 08, 2023 12:05 PM) prn  gabapentin (NEURONTIN) 300 MG capsule [id number] No Take 1 capsule (300 mg total) by mouth at bedtime. Eustaquio Boyden, MD 11/07/2023 Active Self  losartan (COZAAR) 50 MG tablet [id number] No Take 1.5 tablets (75 mg total) by mouth daily. Eustaquio Boyden, MD 11/07/2023  Active Self  methocarbamol (ROBAXIN) 500 MG tablet [id number] No TAKE ONE TABLET BY MOUTH THREE TIMES A DAY AS NEEDED FOR MUSCLE SPASMS (SEDATION PRECAUTIONS) Eustaquio Boyden, MD Taking Active Self           Med Note Orvan Falconer Nov 08, 2023 12:06 PM) prn  omeprazole (PRILOSEC) 40 MG  capsule [id number] No Take 40 mg by mouth in the morning and at bedtime. [provider] 11/07/2023 Active Self  ondansetron (ZOFRAN) 4 MG tablet [id number] No TAKE ONE TABLET BY MOUTH EVERY 8 HOURS AS NEEDED FOR NAUSEA AND VOMITING Eustaquio Boyden, MD Taking Active Self           Med Note Aundria Rud, Jamse Belfast Nov 08, 2023 12:06 PM) prn  sucralfate (CARAFATE) 1 g tablet [id number]  Take 1 tablet (1 g total) by mouth 3 (three) times daily with meals as needed (reflux). Eustaquio Boyden, MD  Active Self           Med Note Hart Rochester, Lafe Garin   Wed Nov 03, 2023  1:11 PM) Has not picked up yet  valACYclovir (VALTREX) 500 MG tablet [id number] No Take one tablet (500 mg) by mouth twice a day for 3 days as needed for a genital outbreak.  Take 4 tablets (2000 mg) by mouth twice a day for 1 day as needed for an oral outbreak. Patton Salles, MD Taking Active Self           Med Note Aundria Rud, Landry Dyke   Mon Nov 08, 2023 12:06 PM) prn            Home Care and Equipment/Supplies: Were Home Health Services Ordered?: NA Any new equipment or medical supplies ordered?: NA  Functional Questionnaire: Do you need assistance with bathing/showering or dressing?: No Do you need assistance with meal preparation?: No Do you need assistance with eating?: No Do you have difficulty maintaining continence: No Do you need assistance with getting out of bed/getting out of a chair/moving?: No Do you have difficulty managing or taking your medications?: No  Follow up appointments reviewed: PCP Follow-up appointment confirmed?: Yes Date of PCP follow-up appointment?: 11/19/23 Follow-up Provider: [facility] Follow-up appointment confirmed?: NA Do you need transportation to your follow-up appointment?: No Do you understand care options if your condition(s) worsen?: Yes-patient verbalized understanding    SIGNATURE Karena Addison, LPN [facility] Nurse Health Advisor Direct  Dial [phone]

## 2023-11-11 MED ORDER — ONDANSETRON HCL 4 MG PO TABS: ORAL_TABLET | ORAL | 0 refills | Status: DC

## 2023-11-12 ENCOUNTER — Ambulatory Visit
Admission: RE | Admit: 2023-11-12 | Discharge: 2023-11-12 | Disposition: A | Discharge status: Home / Self Care | Payer: PPO | Source: Ambulatory Visit | Attending: Obstetrics and Gynecology | Admitting: Obstetrics and Gynecology

## 2023-11-12 ENCOUNTER — Encounter: Payer: Self-pay | Admitting: Student

## 2023-11-12 ENCOUNTER — Ambulatory Visit: Payer: PPO | Admitting: Student

## 2023-11-12 ENCOUNTER — Ambulatory Visit: Payer: PPO | Attending: Student | Admitting: Student

## 2023-11-12 VITALS — BP 132/74 | HR 80 | Ht 63.0 in | Wt 140.0 lb

## 2023-11-12 DIAGNOSIS — I6522 Occlusion and stenosis of left carotid artery: Secondary | ICD-10-CM

## 2023-11-12 DIAGNOSIS — E785 Hyperlipidemia, unspecified: Secondary | ICD-10-CM

## 2023-11-12 DIAGNOSIS — D1801 Hemangioma of skin and subcutaneous tissue: Secondary | ICD-10-CM | POA: Diagnosis not present

## 2023-11-12 DIAGNOSIS — R002 Palpitations: Secondary | ICD-10-CM

## 2023-11-12 DIAGNOSIS — L218 Other seborrheic dermatitis: Secondary | ICD-10-CM | POA: Diagnosis not present

## 2023-11-12 DIAGNOSIS — E2839 Other primary ovarian failure: Secondary | ICD-10-CM | POA: Diagnosis not present

## 2023-11-12 DIAGNOSIS — L814 Other melanin hyperpigmentation: Secondary | ICD-10-CM | POA: Diagnosis not present

## 2023-11-12 DIAGNOSIS — Z780 Asymptomatic menopausal state: Secondary | ICD-10-CM

## 2023-11-12 DIAGNOSIS — R079 Chest pain, unspecified: Secondary | ICD-10-CM | POA: Diagnosis not present

## 2023-11-12 DIAGNOSIS — H61001 Unspecified perichondritis of right external ear: Secondary | ICD-10-CM | POA: Diagnosis not present

## 2023-11-12 DIAGNOSIS — M8580 Other specified disorders of bone density and structure, unspecified site: Secondary | ICD-10-CM

## 2023-11-12 DIAGNOSIS — Z85828 Personal history of other malignant neoplasm of skin: Secondary | ICD-10-CM | POA: Diagnosis not present

## 2023-11-12 DIAGNOSIS — N958 Other specified menopausal and perimenopausal disorders: Secondary | ICD-10-CM | POA: Diagnosis not present

## 2023-11-12 DIAGNOSIS — L570 Actinic keratosis: Secondary | ICD-10-CM | POA: Diagnosis not present

## 2023-11-12 DIAGNOSIS — L821 Other seborrheic keratosis: Secondary | ICD-10-CM | POA: Diagnosis not present

## 2023-11-12 DIAGNOSIS — M8588 Other specified disorders of bone density and structure, other site: Secondary | ICD-10-CM | POA: Diagnosis not present

## 2023-11-12 HISTORY — DX: Other specified disorders of bone density and structure, unspecified site: M85.80

## 2023-11-12 NOTE — Progress Notes (Signed)
Cardiology Clinic Note   Date: 11/12/2023 ID: ULANI DEGRASSE, DOB April 25, 1946, MRN [id number]  Primary Cardiologist:  Julien Nordmann, MD  Patient Profile    Bianca Fry is a 78 y.o. female who presents to the clinic today for hospital follow up.     Past medical history significant for:. Chronic chest pain. Palpitations. 14-day ZIO 11/27/2022: HR 55 to 222 bpm, average 77 bpm.  126 runs of SVT fastest 12 beats max rate 222 bpm, longest 16.6 seconds average 106 bpm.  Rare ectopy. Dyspnea. Echo 12/11/2022: EF 60 to 65%.  No RWMA.  Grade I DD.  Normal RV size/function.  Normal PA pressure, RVSP 25.7 mmHg.  Mild MR. PAD. Carotid artery stenosis/subclavian artery stenosis. Carotid duplex 12/11/2022: No evidence of stenosis right ICA.  Left ICA 1 to 39% stenosis.  Normal flow bilateral subclavian arteries. Hypertension. Hyperlipidemia. Lipid panel 05/21/2023: LDL 69, HDL 57, TG 122, total 151. GERD. Prediabetes. Ischemic colitis with GI bleeding.  In summary, patient was first evaluated by Dr. Mariah Milling on 02/12/2020 for chest pain at the request of Dr. Milinda Antis.  She was being followed for carotid artery/subclavian artery stenosis.  Carotid duplex February 2021 showed left ICA with 40 to 59% stenosis and normal flow bilateral subclavian arteries.  History of normal stress testing in 2012.  In January 2024 patient reported stable intermittent left-sided chest heaviness occurring randomly and not associated with exertion.  She also complained of intermittent dyspnea, palpitations, and flushing having several episodes a week lasting seconds.  She had recently had an episode that lasted a little longer and was associated with near syncope.  14-day ZIO showed runs of SVT as detailed above.  Echo showed normal LV/RV function as detailed above.     History of Present Illness    Bianca Fry is followed by Dr. Mariah Milling for the above outlined history.  Patient was last seen in the office by Dr. Mariah Milling on  12/16/2022 for follow-up after testing.  She opted to defer further testing for atypical chest pain.  Carvedilol was increased for runs of SVT.  Patient presented to the ED on 11/01/2023 with complaints of chest pain.  EKG without acute changes.  Initial labs: Sodium 139, potassium 3.6, creatinine 0.76, BUN 14, WBC 7.9, hemoglobin 11.1.  Troponin negative x 2.  Chest x-ray with no acute cardiopulmonary abnormality.  Patient improved after IV Pepcid and was discharged home.  Patient presented to the ED on 11/07/2023 with complaints of colitis flare.  She was also diagnosed with COVID 3 days prior.  Initial labs: Sodium 134, potassium 3.9, creatinine 0.93, BUN 21, WBC 12.2, hemoglobin 12.1.  CT abdomen pelvis showed colitis/diverticulitis of the distal descending colon and proximal sigmoid colon as well as findings suspicious for cystitis.  Patient was treated with IV antibiotics and discharged on p.o. Augmentin.  Today, patient is here alone. She reports 2 more episodes of very brief sharp left sided chest pain since ED visit. Pain came on randomly right at her left breast lasting just a second and resolving on its own. She recently had a flare of GERD symptoms and colitis. She also had Covid and was coughing quite a bit even before she was diagnosed. She denies shortness of breath, lower extremity edema, orthopnea or PND. She stays active around her home without difficulty. When the weather is warm she likes to walk for exercise. She is pending hip replacement. She would like to have her carotids checked before her surgery at the  end of February.      ROS: All other systems reviewed and are otherwise negative except as noted in History of Present Illness.  EKGs/Labs Reviewed        11/07/2023: ALT 12; AST 16 11/08/2023: BUN 20; Creatinine, Ser 0.66; Potassium 3.8; Sodium 137   11/08/2023: Hemoglobin 10.5; WBC 7.4       Physical Exam    VS:  BP 132/74 (BP Location: Left Arm)   Pulse 80   Ht 5\' 3"   (1.6 m)   Wt 140 lb (63.5 kg)   LMP 10/20/1979 (Within Years)   SpO2 98%   BMI 24.80 kg/m  , BMI Body mass index is 24.8 kg/m.  GEN: Well nourished, well developed, in no acute distress. Neck: No JVD or carotid bruits. Cardiac:  RRR. No murmurs. No rubs or gallops.   Respiratory:  Respirations regular and unlabored. Clear to auscultation without rales, wheezing or rhonchi. GI: Soft, nontender, nondistended. Extremities: Radials/DP/PT 2+ and equal bilaterally. No clubbing or cyanosis. No edema.  Skin: Warm and dry, no rash. Neuro: Strength intact.  Assessment & Plan   Chest pain Echo February 2024 showed normal LV/RV function, Grade I DD, normal PA pressure, mild MR.  Patient reports a couple of episodes of sharp left sided chest pain at her left breast that lasts for a second and resolves on its own. In the setting of normal ED workup and recent flare in GERD and colitis as well as recent Covid infection with a lot of coughing, I do not think further cardiac workup is indicated. Patient is in agreement.  -Continue to monitor.   Palpitations 14-day ZIO February 2024 showed 126 runs of SVT.  Patient denies palpitations. RRR on exam today.  -Continue carvedilol.  Hypertension BP today 132/74. No report of headache or dizziness.  -Continue losartan, carvedilol.  Carotid artery stenosis/subclavian artery stenosis Carotid duplex February 2024 showed no evidence of stenosis right ICA, 1 to 39% stenosis left ICA, normal flow bilateral subclavian arteries.  Patient denies symptoms. She would like to have her carotids checked again before her hip surgery.  -Schedule carotid artery duplex. -Continue aspirin, atorvastatin.  Hyperlipidemia LDL August 2024 69, at goal. -Continue atorvastatin.  Disposition: Carotid duplex. Return in 1 year or sooner as needed.          Signed, Etta Grandchild. Mayar Whittier, DNP, NP-C

## 2023-11-12 NOTE — Patient Instructions (Signed)
Medication Instructions:  Your Physician recommend you continue on your current medication as directed.    *If you need a refill on your cardiac medications before your next appointment, please call your pharmacy*  Lab Work: None ordered at this time  If you have labs (blood work) drawn today and your tests are completely normal, you will receive your results only by: MyChart Message (if you have MyChart) OR A paper copy in the mail If you have any lab test that is abnormal or we need to change your treatment, we will call you to review the results.  Testing/Procedures: Your physician has requested that you have a carotid duplex. This test is an ultrasound of the carotid arteries in your neck. It looks at blood flow through these arteries that supply the brain with blood.   Allow one hour for this exam.  There are no restrictions or special instructions.  This will take place at [address] [facility] [address] [facility]) [address], [place] [address]  Please note: We ask at that you not bring children with you during ultrasound (echo/ vascular) testing. Due to room size and safety concerns, children are not allowed in the ultrasound rooms during exams. Our front office staff cannot provide observation of children in our lobby area while testing is being conducted. An adult accompanying a patient to their appointment will only be allowed in the ultrasound room at the discretion of the ultrasound technician under special circumstances. We apologize for any inconvenience.   Follow-Up: At [facility], you and your health needs are our priority.  As part of our continuing mission to provide you with exceptional heart care, we have created designated Provider Care Teams.  These Care Teams include your primary Cardiologist (physician) and Advanced Practice Providers (APPs -  Physician Assistants and Nurse Practitioners) who all work together to provide you with the care you need, when you need  it.   Your next appointment:   1 year(s)  Provider:   You may see Julien Nordmann, MD or one of the following Advanced Practice Providers on your designated Care Team:   Nicolasa Ducking, NP Eula Listen, PA-C Cadence Fransico Michael, PA-C Charlsie Quest, NP Carlos Levering, NP

## 2023-11-15 ENCOUNTER — Other Ambulatory Visit (HOSPITAL_BASED_OUTPATIENT_CLINIC_OR_DEPARTMENT_OTHER): Payer: Self-pay

## 2023-11-15 ENCOUNTER — Encounter: Payer: Self-pay | Admitting: Obstetrics and Gynecology

## 2023-11-19 ENCOUNTER — Ambulatory Visit: Payer: PPO | Admitting: Family Medicine

## 2023-11-19 ENCOUNTER — Encounter: Payer: Self-pay | Admitting: Family Medicine

## 2023-11-19 VITALS — BP 134/72 | HR 78 | Temp 98.1°F | Ht 63.0 in | Wt 141.1 lb

## 2023-11-19 DIAGNOSIS — N3000 Acute cystitis without hematuria: Secondary | ICD-10-CM | POA: Diagnosis not present

## 2023-11-19 DIAGNOSIS — G4486 Cervicogenic headache: Secondary | ICD-10-CM | POA: Diagnosis not present

## 2023-11-19 DIAGNOSIS — M542 Cervicalgia: Secondary | ICD-10-CM | POA: Diagnosis not present

## 2023-11-19 DIAGNOSIS — K529 Noninfective gastroenteritis and colitis, unspecified: Secondary | ICD-10-CM

## 2023-11-19 DIAGNOSIS — M5481 Occipital neuralgia: Secondary | ICD-10-CM | POA: Insufficient documentation

## 2023-11-19 MED ORDER — DEXAMETHASONE SODIUM PHOSPHATE 10 MG/ML IJ SOLN: 10.0000 mg | Freq: Once | INTRAMUSCULAR | Status: DC

## 2023-11-19 MED ORDER — DEXAMETHASONE SODIUM PHOSPHATE 10 MG/ML IJ SOLN
10.0000 mg | Freq: Once | INTRAMUSCULAR | Status: AC
  Administered 2023-11-19: 10 mg via INTRAMUSCULAR

## 2023-11-19 NOTE — Patient Instructions (Addendum)
Cancel next months appointment, reschedule to 3 months from now.  Continue heating pad, robaxin, tylenol use for right neck pain Exercises provided today Let us know if not better for neck xray.  Steroid shot today for neck pain/headache.

## 2023-11-19 NOTE — Progress Notes (Unsigned)
Ph: [phone] Fax: [phone]   Patient ID: Bianca Fry, female    DOB: 02-13-1946, 78 y.o.   MRN: [id number]  This visit was conducted in person.  BP 134/72   Pulse 78   Temp 98.1 F (36.7 C) (Oral)   Ht 5\' 3"  (1.6 m)   Wt 141 lb 2 oz (64 kg)   LMP 10/20/1979 (Within Years)   SpO2 96%   BMI 25.00 kg/m    CC: hosp f/u visit  Subjective:   HPI: Bianca Fry is a 78 y.o. female presenting on 11/19/2023 for Hospitalization Follow-up (Admitted on 11/07/23 at [facility], dx primary OA of R hip; HTN; thrombocytopenia; anemia; palpitations; iron deficiency. C/o severe HA. )   Recent hospitalization for LLQ abd pain with bloody diarrhea and dysuria, as well as COVID diagnosed 11/04/2023. Found to have colitis/diverticulitis of distal descending colon and proximal sigmoid colon by CT scan, as well as enhancement and wall thickening of bladder. Met sepsis criteria.  Hospital records reviewed. Med rec performed.  Treated for colitis and cystitis with IV antibiotic (rocephin), diet advanced from clear liquid to regular. Discharged on oral antibiotics augmentin and ciprofloxacin 5d course.  GI pathogen panel WNL UCx grew <10k colonies, insignificant growth - may have been collected after IV rocephin.   Upcoming R hip replacement surgery late next month ([facility]) - this was postponed from earlier in the week due to COVID.   This week started having acutely worse R sided neck pain with radiation into posterior head. Sharp stabbing pain. She notes increased family stress. She has been managing pain with robaxin and tylenol and heating pad/hot shower with benefit. Headache/ neck pain is affecting sleep. No cervical radiculopathy symptoms (shooting pain down arm, numbness, weakness or paresthesias) Last cervical films 10/2020 IMPRESSION: Osteoarthritic change at several levels, most severe at C5-6. Areas of mild spondylolisthesis at C5-6 and C7-T1, felt to be secondary to underlying spondylosis.  No fracture.  GERD - reflux well controlled on omeprazole 40mg  bid - planning for 2 wks then planned change to nexium 40mg  daily. Didn't take carafate.   Subsequently saw cardiology for yearly visit. Planned updated carotid US.   Home health not set up.  Other follow up appointments scheduled: none ______________________________________________________________________ Hospital admission: 11/07/2023 Hospital discharge: 11/09/2023 TCM f/u phone call:  performed on 11/10/2023  Recommendations at discharge:  Complete antibiotic therapy as recommended   Discharge Diagnoses: Principal Problem:   Acute colitis Active Problems:   Essential hypertension   Acute cystitis   Hypotension   COVID-19 virus infection   CAD (coronary artery disease)   Osteoarthritis of hip   Anxiety     Relevant past medical, surgical, family and social history reviewed and updated as indicated. Interim medical history since our last visit reviewed. Allergies and medications reviewed and updated. Outpatient Medications Prior to Visit  Medication Sig Dispense Refill   acidophilus (RISAQUAD) CAPS capsule Take 1 capsule by mouth daily.     ALPRAZolam (XANAX) 0.5 MG tablet TAKE ONE TABLET BY MOUTH TWICE A DAY AS NEEDED FOR ANXIETY 40 tablet 0   aspirin EC 81 MG tablet Take 1 tablet (81 mg total) by mouth daily. Swallow whole.     atorvastatin (LIPITOR) 10 MG tablet Take 1 tablet (10 mg total) by mouth daily. 90 tablet 3   Calcium Carb-Cholecalciferol (CALCIUM-VITAMIN D) 600-400 MG-UNIT TABS Take 1 tablet by mouth daily.     carvedilol (COREG) 6.25 MG tablet Take 1 tablet (6.25 mg total)  by mouth 2 (two) times daily with a meal. 180 tablet 3   cholecalciferol (VITAMIN D) 25 MCG (1000 UNIT) tablet Take 1,000 Units by mouth daily.     Cyanocobalamin (B-12) 1000 MCG SUBL Place 1 tablet under the tongue every Monday, Wednesday, and Friday.     dicyclomine (BENTYL) 10 MG capsule Take 1 capsule (10 mg total) by mouth 2  (two) times daily as needed for spasms. 90 capsule 1   EPINEPHrine 0.3 mg/0.3 mL IJ SOAJ injection Inject 0.3 mg into the muscle as needed for anaphylaxis. 1 each 1   esomeprazole (NEXIUM) 40 MG capsule Take 1 capsule (40 mg total) by mouth daily.     estradiol (ESTRACE) 0.1 MG/GM vaginal cream Use 1/2 g vaginally two or three times per week as needed to maintain symptom relief. 42.5 g 2   fexofenadine (ALLEGRA) 180 MG tablet Take 180 mg by mouth daily as needed for allergies.     gabapentin (NEURONTIN) 300 MG capsule Take 1 capsule (300 mg total) by mouth at bedtime. 90 capsule 1   losartan (COZAAR) 50 MG tablet Take 1.5 tablets (75 mg total) by mouth daily. 135 tablet 4   methocarbamol (ROBAXIN) 500 MG tablet TAKE ONE TABLET BY MOUTH THREE TIMES A DAY AS NEEDED FOR MUSCLE SPASMS (SEDATION PRECAUTIONS) 30 tablet 0   ondansetron (ZOFRAN) 4 MG tablet TAKE ONE TABLET BY MOUTH EVERY 8 HOURS AS NEEDED FOR NAUSEA AND VOMITING 20 tablet 0   sucralfate (CARAFATE) 1 g tablet Take 1 tablet (1 g total) by mouth 3 (three) times daily with meals as needed (reflux). 60 tablet 0   valACYclovir (VALTREX) 500 MG tablet Take one tablet (500 mg) by mouth twice a day for 3 days as needed for a genital outbreak.  Take 4 tablets (2000 mg) by mouth twice a day for 1 day as needed for an oral outbreak. 30 tablet 1   omeprazole (PRILOSEC) 40 MG capsule Take 40 mg by mouth in the morning and at bedtime.     amoxicillin-clavulanate (AUGMENTIN) 875-125 MG tablet Take 1 tablet by mouth 2 (two) times daily. (Patient not taking: Reported on 11/19/2023)     No facility-administered medications prior to visit.     Per HPI unless specifically indicated in ROS section below Review of Systems  Objective:  BP 134/72   Pulse 78   Temp 98.1 F (36.7 C) (Oral)   Ht 5\' 3"  (1.6 m)   Wt 141 lb 2 oz (64 kg)   LMP 10/20/1979 (Within Years)   SpO2 96%   BMI 25.00 kg/m   Wt Readings from Last 3 Encounters:  11/19/23 141 lb 2 oz  (64 kg)  11/12/23 140 lb (63.5 kg)  11/08/23 138 lb 9.6 oz (62.9 kg)      Physical Exam Vitals and nursing note reviewed.  Constitutional:      Appearance: Normal appearance. She is not ill-appearing.  HENT:     Head: Normocephalic and atraumatic.     Mouth/Throat:     Mouth: Mucous membranes are moist.     Pharynx: Oropharynx is clear. No oropharyngeal exudate or posterior oropharyngeal erythema.  Eyes:     Conjunctiva/sclera: Conjunctivae normal.  Neck:     Comments:  Tender to palpation mldline lower cervical neck as well as R>L paracervical neck muscles FROM neck flexion/extension Limited R lateral rotation and bilateral lateral flexion due to pain Cardiovascular:     Rate and Rhythm: Normal rate and regular rhythm.  Pulses: Normal pulses.     Heart sounds: Normal heart sounds. No murmur heard. Pulmonary:     Effort: Pulmonary effort is normal. No respiratory distress.     Breath sounds: Normal breath sounds. No wheezing, rhonchi or rales.  Musculoskeletal:     Cervical back: Neck supple. Tenderness present. No rigidity.     Right lower leg: No edema.     Left lower leg: No edema.  Skin:    General: Skin is warm and dry.     Findings: No rash.  Neurological:     Mental Status: She is alert.     Comments:  Grip strength intact Neg spurling bilaterally  Psychiatric:        Mood and Affect: Mood normal.        Behavior: Behavior normal.       Lab Results  Component Value Date   NA 137 11/08/2023   CL 107 11/08/2023   K 3.8 11/08/2023   CO2 21 (L) 11/08/2023   BUN 20 11/08/2023   CREATININE 0.66 11/08/2023   GFRNONAA >60 11/08/2023   CALCIUM 9.1 11/08/2023   ALBUMIN 3.7 11/07/2023   GLUCOSE 92 11/08/2023     Assessment & Plan:   Problem List Items Addressed This Visit     Cervicogenic headache - Primary   Neck pain on right side     Meds ordered this encounter  Medications   DISCONTD: dexamethasone (DECADRON) injection 10 mg   dexamethasone  (DECADRON) injection 10 mg    No orders of the defined types were placed in this encounter.   Patient Instructions  Cancel next months appointment, reschedule to 3 months from now.  Continue heating pad, robaxin, tylenol use for right neck pain Exercises provided today Let us know if not better for neck xray.  Steroid shot today for neck pain/headache.   Follow up plan: Return in about 3 months (around 02/16/2024) for follow up visit.  Eustaquio Boyden, MD

## 2023-11-20 ENCOUNTER — Encounter: Payer: Self-pay | Admitting: Family Medicine

## 2023-11-20 NOTE — Assessment & Plan Note (Signed)
Completed cipro course with resolution of symptoms.

## 2023-11-20 NOTE — Assessment & Plan Note (Signed)
With sepsis. Completed cipro course with resolution of symptoms.

## 2023-11-20 NOTE — Assessment & Plan Note (Signed)
Known cervical DDD anticipate exacerbation of this.  Rx with dexamethasone IM x1 today, then continue home heating pad, robaxin, tylenol PRN.  Exercises provided today.  Consider updated cervical films vs PT eval.

## 2023-11-20 NOTE — Assessment & Plan Note (Signed)
Suspect MSK cause of pain.  Rx robaxin, heating pad, tylenol. Provided with exercises from [facility] pt advisor.  Update if not improving with treatment.

## 2023-11-21 ENCOUNTER — Other Ambulatory Visit: Payer: Self-pay

## 2023-11-21 ENCOUNTER — Emergency Department (HOSPITAL_COMMUNITY)
Admission: EM | Admit: 2023-11-21 | Discharge: 2023-11-21 | Disposition: A | Discharge status: Home / Self Care | Payer: PPO | Attending: Emergency Medicine | Admitting: Emergency Medicine

## 2023-11-21 ENCOUNTER — Emergency Department (HOSPITAL_COMMUNITY): Payer: PPO

## 2023-11-21 ENCOUNTER — Encounter (HOSPITAL_COMMUNITY): Payer: Self-pay

## 2023-11-21 DIAGNOSIS — R519 Headache, unspecified: Secondary | ICD-10-CM | POA: Diagnosis not present

## 2023-11-21 DIAGNOSIS — I2510 Atherosclerotic heart disease of native coronary artery without angina pectoris: Secondary | ICD-10-CM | POA: Diagnosis not present

## 2023-11-21 DIAGNOSIS — Z79899 Other long term (current) drug therapy: Secondary | ICD-10-CM | POA: Diagnosis not present

## 2023-11-21 DIAGNOSIS — Z8616 Personal history of COVID-19: Secondary | ICD-10-CM | POA: Diagnosis not present

## 2023-11-21 DIAGNOSIS — I10 Essential (primary) hypertension: Secondary | ICD-10-CM | POA: Insufficient documentation

## 2023-11-21 DIAGNOSIS — M5481 Occipital neuralgia: Secondary | ICD-10-CM | POA: Insufficient documentation

## 2023-11-21 DIAGNOSIS — Z7982 Long term (current) use of aspirin: Secondary | ICD-10-CM | POA: Insufficient documentation

## 2023-11-21 DIAGNOSIS — I6523 Occlusion and stenosis of bilateral carotid arteries: Secondary | ICD-10-CM | POA: Diagnosis not present

## 2023-11-21 LAB — COMPREHENSIVE METABOLIC PANEL
ALT: 19 U/L (ref 0–44)
AST: 20 U/L (ref 15–41)
Albumin: 3.4 g/dL — ABNORMAL LOW (ref 3.5–5.0)
Alkaline Phosphatase: 61 U/L (ref 38–126)
Anion gap: 9 (ref 5–15)
BUN: 17 mg/dL (ref 8–23)
CO2: 24 mmol/L (ref 22–32)
Calcium: 9.9 mg/dL (ref 8.9–10.3)
Chloride: 105 mmol/L (ref 98–111)
Creatinine, Ser: 0.93 mg/dL (ref 0.44–1.00)
GFR, Estimated: >60 mL/min (ref >60)
Glucose, Bld: 97 mg/dL (ref 70–99)
Potassium: 3.5 mmol/L (ref 3.5–5.1)
Sodium: 138 mmol/L (ref 135–145)
Total Bilirubin: 0.6 mg/dL (ref 0.0–1.2)
Total Protein: 6.2 g/dL — ABNORMAL LOW (ref 6.5–8.1)

## 2023-11-21 LAB — CBC
HCT: 34.2 % — ABNORMAL LOW (ref 36.0–46.0)
Hemoglobin: 10.8 g/dL — ABNORMAL LOW (ref 12.0–15.0)
MCH: 27.7 pg (ref 26.0–34.0)
MCHC: 31.6 g/dL (ref 30.0–36.0)
MCV: 87.7 fL (ref 80.0–100.0)
Platelets: 156 10*3/uL (ref 150–400)
RBC: 3.9 MIL/uL (ref 3.87–5.11)
RDW: 13.9 % (ref 11.5–15.5)
WBC: 11.5 10*3/uL — ABNORMAL HIGH (ref 4.0–10.5)
nRBC: 0 % (ref 0.0–0.2)

## 2023-11-21 MED ORDER — ACETAMINOPHEN 500 MG PO TABS
1000.0000 mg | ORAL_TABLET | Freq: Once | ORAL | Status: AC
  Administered 2023-11-21: 1000 mg via ORAL
  Filled 2023-11-21: qty 2

## 2023-11-21 MED ORDER — GABAPENTIN 300 MG PO CAPS
300.0000 mg | ORAL_CAPSULE | Freq: Once | ORAL | Status: AC
  Administered 2023-11-21: 300 mg via ORAL
  Filled 2023-11-21: qty 1

## 2023-11-21 MED ORDER — TRIAMCINOLONE ACETONIDE 40 MG/ML IJ SUSP
40.0000 mg | Freq: Once | INTRAMUSCULAR | Status: AC
  Administered 2023-11-21: 40 mg via INTRAMUSCULAR
  Filled 2023-11-21: qty 1

## 2023-11-21 MED ORDER — LIDOCAINE HCL (PF) 1 % IJ SOLN
5.0000 mL | Freq: Once | INTRAMUSCULAR | Status: AC
  Administered 2023-11-21: 5 mL via INTRADERMAL
  Filled 2023-11-21: qty 5

## 2023-11-21 NOTE — ED Provider Triage Note (Signed)
Emergency Medicine Provider Triage Evaluation Note  Bianca Fry , a 78 y.o. female  was evaluated in triage.  Pt complains of headache. Notes pain x 1 week, started when she was sitting on the couch. Originally in her neck area, radiates into her head on the right side. Gets worse without discernable trigger, is not worse with movement. No vision changes. No sharp shooting pain in extremities or numbness/tingling. Pain feels like pressure. No history of same. History of neck pain, states this feels different. No trauma.  Review of Systems  Positive:  Negative:   Physical Exam  BP (!) 179/74   Pulse 67   Temp 97.6 F (36.4 C)   Resp 15   LMP 10/20/1979 (Within Years)   SpO2 98%  Gen:   Awake, no distress   Resp:  Normal effort  MSK:   Moves extremities without difficulty  Other:  No specific tenderness to palpation of the cervical spine or surrounding musculature. Neck Rom intact without pain. No focal neurologic deficits. Ptosis of the right eyelid unchanged from baseline.  Medical Decision Making  Medically screening exam initiated at 4:41 PM.  Appropriate orders placed.  Shelsea Hangartner Arther was informed that the remainder of the evaluation will be completed by another provider, this initial triage assessment does not replace that evaluation, and the importance of remaining in the ED until their evaluation is complete.     Silva Bandy, PA-C 11/21/23 1645
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
## 2023-11-21 NOTE — ED Triage Notes (Signed)
Pt c.o right sided headache and neck pain x 1 week. Pt saw her PCP Friday and was given a steroid injection and rx but pt not feeling any better. Denies dizziness or nausea.

## 2023-11-21 NOTE — ED Notes (Signed)
The  pt has had a headache for a week and nothing is helping the pain a and o x 4

## 2023-11-21 NOTE — Discharge Instructions (Addendum)
We evaluated you for your headache.  Your examination suggests your headache is caused by a condition called occipital neuralgia.  This can be caused by a pinched nerve or inflammation to a nerve in the back of your skull.  Your symptoms improved with the nerve block we performed in the emergency department.  Please take at 1000 mg of Tylenol every 6 hours as needed for headache.  You can also take 300 mg of gabapentin up to 3 times daily for headache.  Please follow-up closely with your primary doctor.  Please return if you develop any new or worsening symptoms such as fevers or chills, trouble moving your neck, severe worsening headaches, vision changes, numbness or tingling, weakness, trouble walking, vomiting, confusion, or any other concerning symptoms.

## 2023-11-29 ENCOUNTER — Encounter: Payer: Self-pay | Admitting: Family Medicine

## 2023-11-29 ENCOUNTER — Ambulatory Visit (INDEPENDENT_AMBULATORY_CARE_PROVIDER_SITE_OTHER): Payer: PPO | Admitting: Family Medicine

## 2023-11-29 VITALS — BP 132/66 | HR 69 | Temp 97.8°F | Ht 63.0 in | Wt 139.0 lb

## 2023-11-29 DIAGNOSIS — M5481 Occipital neuralgia: Secondary | ICD-10-CM | POA: Diagnosis not present

## 2023-11-29 DIAGNOSIS — F419 Anxiety disorder, unspecified: Secondary | ICD-10-CM | POA: Diagnosis not present

## 2023-11-29 MED ORDER — ALPRAZOLAM 0.5 MG PO TABS: ORAL_TABLET | ORAL | 0 refills | Status: DC

## 2023-11-29 NOTE — Assessment & Plan Note (Signed)
 Discussed recent ER visit and occipital nerve block treatment.  Will restart gabapentin  300mg  nightly with option to increase to BID dosing.  Discussed return to [facility]) if recurrent symptoms for further management options.

## 2023-11-29 NOTE — Progress Notes (Signed)
 Ph: [phone] Fax: [phone]   Patient ID: Bianca Fry, female    DOB: 1945/12/06, 78 y.o.   MRN: [id number]  This visit was conducted in person.  BP 132/66   Pulse 69   Temp 97.8 F (36.6 C) (Oral)   Ht 5\' 3"  (1.6 m)   Wt 139 lb (63 kg)   LMP 10/20/1979 (Within Years)   SpO2 96%   BMI 24.62 kg/m    CC: ER f/u visit  Subjective:   HPI: Bianca Fry is a 78 y.o. female presenting on 11/29/2023 for Hospitalization Follow-up (Seen on 11/21/23 at [facility], dx occipital neuralgia of L side.)   Recent hospitalization for colitis/diverticulitis of distal descending colon into proximal sigmoid colon by CT scan as well as UTI treated with IV rocephin  transitioned to oral ciprofloxacin . Symptoms did improve. Subsequently developed R sided occipital headache.  See prior note and recent ER note.  Known cervical DDD treated initially with IM dexamethasone  10mg , heating pad, robaxin , tylenol . No significant improvement so presented to ER.   Seen at ER on 11/21/2023 after our visit, diagnosed with R sided occipital neuralgia s/p R sided occipital nerve block with lidocaine  and triamcinolone  with significant improvement. She still notes some discomfort to the base of the skull on the right.   At ER: Head CT without contrast IMPRESSION: 1. No acute intracranial findings. 2. Periventricular white matter and corona radiata hypodensities favor chronic ischemic microvascular white matter disease. 3. Right mastoid effusion and trace left mastoid effusion.   She was on gabapentin  300mg  nightly for vulvar pain possibly spine related vs vulvodynia through GYN but hadn't been using regularly. Estrogen cream didn't help, Lotrisone cream did help (through urology). She is now using gabapentin  300mg  BID PRN.   She sees Dr Dawley neurosurgeon at [facility] last seen 03/2023. Has seen Dr Crecencio Dodge and Merri Abbe PM&R at same clinic.   Care Team: Cardiology: Dr Frieda Jew Dr Aubry Blase NSG Dr Rosalva Comber  Dawley PM&R Dr Crecencio Dodge GYN Dr Neomi Banks Dr Dulcy Gibney at [facility]     Relevant past medical, surgical, family and social history reviewed and updated as indicated. Interim medical history since our last visit reviewed. Allergies and medications reviewed and updated. Outpatient Medications Prior to Visit  Medication Sig Dispense Refill   acidophilus (RISAQUAD) CAPS capsule Take 1 capsule by mouth daily.     aspirin  EC 81 MG tablet Take 1 tablet (81 mg total) by mouth daily. Swallow whole.     atorvastatin  (LIPITOR) 10 MG tablet Take 1 tablet (10 mg total) by mouth daily. 90 tablet 3   Calcium  Carb-Cholecalciferol  (CALCIUM -VITAMIN D ) 600-400 MG-UNIT TABS Take 1 tablet by mouth daily.     carvedilol  (COREG ) 6.25 MG tablet Take 1 tablet (6.25 mg total) by mouth 2 (two) times daily with a meal. 180 tablet 3   cholecalciferol  (VITAMIN D ) 25 MCG (1000 UNIT) tablet Take 1,000 Units by mouth daily.     Cyanocobalamin  (B-12) 1000 MCG SUBL Place 1 tablet under the tongue every Monday, Wednesday, and Friday.     dicyclomine  (BENTYL ) 10 MG capsule Take 1 capsule (10 mg total) by mouth 2 (two) times daily as needed for spasms. 90 capsule 1   EPINEPHrine  0.3 mg/0.3 mL IJ SOAJ injection Inject 0.3 mg into the muscle as needed for anaphylaxis. 1 each 1   esomeprazole  (NEXIUM ) 40 MG capsule Take 1 capsule (40 mg total) by mouth daily.     estradiol  (ESTRACE ) 0.1 MG/GM vaginal cream  Use 1/2 g vaginally two or three times per week as needed to maintain symptom relief. 42.5 g 2   fexofenadine (ALLEGRA) 180 MG tablet Take 180 mg by mouth daily as needed for allergies.     gabapentin  (NEURONTIN ) 300 MG capsule Take 1 capsule (300 mg total) by mouth at bedtime. 90 capsule 1   losartan  (COZAAR ) 50 MG tablet Take 1.5 tablets (75 mg total) by mouth daily. 135 tablet 4   ondansetron  (ZOFRAN ) 4 MG tablet TAKE ONE TABLET BY MOUTH EVERY 8 HOURS AS NEEDED FOR NAUSEA AND VOMITING 20 tablet 0   sucralfate  (CARAFATE ) 1  g tablet Take 1 tablet (1 g total) by mouth 3 (three) times daily with meals as needed (reflux). 60 tablet 0   valACYclovir  (VALTREX ) 500 MG tablet Take one tablet (500 mg) by mouth twice a day for 3 days as needed for a genital outbreak.  Take 4 tablets (2000 mg) by mouth twice a day for 1 day as needed for an oral outbreak. 30 tablet 1   ALPRAZolam  (XANAX ) 0.5 MG tablet TAKE ONE TABLET BY MOUTH TWICE A DAY AS NEEDED FOR ANXIETY 40 tablet 0   methocarbamol  (ROBAXIN ) 500 MG tablet TAKE ONE TABLET BY MOUTH THREE TIMES A DAY AS NEEDED FOR MUSCLE SPASMS (SEDATION PRECAUTIONS) 30 tablet 0   No facility-administered medications prior to visit.     Per HPI unless specifically indicated in ROS section below Review of Systems  Objective:  BP 132/66   Pulse 69   Temp 97.8 F (36.6 C) (Oral)   Ht 5\' 3"  (1.6 m)   Wt 139 lb (63 kg)   LMP 10/20/1979 (Within Years)   SpO2 96%   BMI 24.62 kg/m   Wt Readings from Last 3 Encounters:  11/29/23 139 lb (63 kg)  11/19/23 141 lb 2 oz (64 kg)  11/12/23 140 lb (63.5 kg)      Physical Exam Vitals and nursing note reviewed.  Constitutional:      Appearance: Normal appearance. She is not ill-appearing.  HENT:     Head: Normocephalic and atraumatic.     Mouth/Throat:     Mouth: Mucous membranes are moist.     Pharynx: Oropharynx is clear. No oropharyngeal exudate or posterior oropharyngeal erythema.  Eyes:     Extraocular Movements: Extraocular movements intact.     Pupils: Pupils are equal, round, and reactive to light.     Comments: Chronic R ptosis  Neck:     Comments:  Mild midline cervical spine pain ~C6  No significant tenderness  Point tender to R occipital nerve  Limited ROM to R lateral flexion and rotation due to discomfort  FROM flexion/extension at neck.  Musculoskeletal:     Cervical back: Normal range of motion and neck supple. Tenderness present. No rigidity.  Neurological:     Mental Status: She is alert.  Psychiatric:         Mood and Affect: Mood normal.        Behavior: Behavior normal.        Assessment & Plan:   Problem List Items Addressed This Visit     Anxiety   Alprazolam  refilled.       Relevant Medications   ALPRAZolam  (XANAX ) 0.5 MG tablet   Occipital neuralgia of right side - Primary   Discussed recent ER visit and occipital nerve block treatment.  Will restart gabapentin  300mg  nightly with option to increase to BID dosing.  Discussed return to PM&R Vance Gell) if  recurrent symptoms for further management options.       Relevant Medications   ALPRAZolam  (XANAX ) 0.5 MG tablet     Meds ordered this encounter  Medications   ALPRAZolam  (XANAX ) 0.5 MG tablet    Sig: TAKE ONE TABLET BY MOUTH TWICE A DAY AS NEEDED FOR ANXIETY    Dispense:  40 tablet    Refill:  0    No orders of the defined types were placed in this encounter.   Patient Instructions  You should have 3 mo supply gabapentin  refill available at [facility] pharmacy. Take nightly 300mg  with option to use 2nd dose as needed. Let me know when you need next refill  If recurrent pain, return to Dr Crecencio Dodge for further management of right sided occipital neuralgia.   Follow up plan: Return if symptoms worsen or fail to improve.  Claire Crick, MD

## 2023-11-29 NOTE — Assessment & Plan Note (Signed)
 Alprazolam refilled.

## 2023-11-29 NOTE — Patient Instructions (Addendum)
 You should have 3 mo supply gabapentin  refill available at [facility] pharmacy. Take nightly 300mg  with option to use 2nd dose as needed. Let me know when you need next refill  If recurrent pain, return to Dr Crecencio Dodge for further management of right sided occipital neuralgia.

## 2023-12-01 ENCOUNTER — Ambulatory Visit: Payer: PPO | Admitting: Family Medicine

## 2023-12-02 ENCOUNTER — Encounter
Admission: RE | Admit: 2023-12-02 | Discharge: 2023-12-02 | Disposition: A | Discharge status: Home / Self Care | Payer: PPO | Source: Ambulatory Visit | Attending: Orthopedic Surgery | Admitting: Orthopedic Surgery

## 2023-12-02 ENCOUNTER — Telehealth: Payer: Self-pay | Admitting: *Deleted

## 2023-12-02 ENCOUNTER — Other Ambulatory Visit: Payer: Self-pay

## 2023-12-02 VITALS — BP 126/65 | HR 75 | Temp 97.6°F | Resp 17 | Ht 63.0 in | Wt 138.0 lb

## 2023-12-02 DIAGNOSIS — Z01812 Encounter for preprocedural laboratory examination: Secondary | ICD-10-CM | POA: Insufficient documentation

## 2023-12-02 DIAGNOSIS — R8290 Unspecified abnormal findings in urine: Secondary | ICD-10-CM | POA: Diagnosis not present

## 2023-12-02 HISTORY — DX: Other constipation: K59.09

## 2023-12-02 HISTORY — DX: Iron deficiency anemia, unspecified: D50.9

## 2023-12-02 HISTORY — DX: Long term (current) use of aspirin: Z79.82

## 2023-12-02 HISTORY — DX: Occlusion and stenosis of left carotid artery: I65.22

## 2023-12-02 HISTORY — DX: Unilateral primary osteoarthritis, right hip: M16.11

## 2023-12-02 HISTORY — DX: Vitamin D deficiency, unspecified: E55.9

## 2023-12-02 HISTORY — DX: Essential (primary) hypertension: I10

## 2023-12-02 HISTORY — DX: Atherosclerosis of aorta: I70.0

## 2023-12-02 HISTORY — DX: Irritable bowel syndrome, unspecified: K58.9

## 2023-12-02 HISTORY — DX: Atherosclerotic heart disease of native coronary artery without angina pectoris: I25.10

## 2023-12-02 HISTORY — DX: Peripheral vascular disease, unspecified: I73.9

## 2023-12-02 HISTORY — DX: Supraventricular tachycardia, unspecified: I47.10

## 2023-12-02 HISTORY — DX: Stricture of artery: I77.1

## 2023-12-02 LAB — URINALYSIS, ROUTINE W REFLEX MICROSCOPIC
Bilirubin Urine: NEGATIVE
Glucose, UA: NEGATIVE mg/dL
Ketones, ur: NEGATIVE mg/dL
Nitrite: NEGATIVE
Protein, ur: NEGATIVE mg/dL
Specific Gravity, Urine: 1.019 (ref 1.005–1.030)
WBC, UA: >50 WBC/hpf (ref 0–5)
pH: 5 (ref 5.0–8.0)

## 2023-12-02 LAB — TYPE AND SCREEN
ABO/RH(D): O POS
Antibody Screen: NEGATIVE

## 2023-12-02 LAB — SURGICAL PCR SCREEN
MRSA, PCR: NEGATIVE
Staphylococcus aureus: NEGATIVE

## 2023-12-02 LAB — SEDIMENTATION RATE: Sed Rate: 33 mm/h — ABNORMAL HIGH (ref 0–30)

## 2023-12-02 LAB — C-REACTIVE PROTEIN: CRP: 0.5 mg/dL (ref <1.0)

## 2023-12-02 NOTE — Progress Notes (Signed)
  [facility] Perioperative Services: Pre-Admission/Anesthesia Testing  Abnormal Lab Notification   Date: 12/02/23  Name: Bianca Fry MRN:   [id number]  Re: Abnormal labs noted during PAT appointment   Notified:  Provider Name Provider Role Notification Mode  Francesco Sor, MD Orthopedics (Surgeon) Routed and/or faxed via [facility]   Abnormal Lab Value(s):   Lab Results  Component Value Date   COLORURINE YELLOW (A) 12/02/2023   APPEARANCEUR CLOUDY (A) 12/02/2023   LABSPEC 1.019 12/02/2023   PHURINE 5.0 12/02/2023   GLUCOSEU NEGATIVE 12/02/2023   HGBUR SMALL (A) 12/02/2023   BILIRUBINUR NEGATIVE 12/02/2023   KETONESUR NEGATIVE 12/02/2023   PROTEINUR NEGATIVE 12/02/2023   NITRITE NEGATIVE 12/02/2023   LEUKOCYTESUR MODERATE (A) 12/02/2023   EPIU 0-5 12/02/2023   WBCU >50 12/02/2023   RBCU 11-20 12/02/2023   BACTERIA MANY (A) 12/02/2023   Clinical Information and Notes:  Patient is scheduled for TOTAL HIP ARTHROPLASTY (Right: Hip) on 12/15/2023.    UA performed in PAT consistent with/concerning for infection.  Slight  leukocytosis noted on CBC; WBC 11.5 Renal function: Estimated Creatinine Clearance: 41.2 mL/min (by C-G formula based on SCr of 0.93 mg/dL). Urine C&S added to assess for pathogenically significant growth.   Impression and Plan:  Bianca Fry with a UA that was (+) for infection; reflex culture sent. Will plan on forwarding final culture results to MD as they become available to me. Sending results for review and consideration of preoperative treatment as deemed appropriate by Dr. Ernest Pine.   Quentin Mulling, MSN, APRN, FNP-C, CEN [facility]  Perioperative Services Nurse Practitioner Phone: [phone] Fax: [phone] 12/02/23 3:40 PM  NOTE: This note has been prepared using Dragon dictation software. Despite my best ability to proofread, there is always the potential that unintentional transcriptional errors may  still occur from this process.

## 2023-12-02 NOTE — Telephone Encounter (Signed)
   Patient Name: Bianca Fry  DOB: 21-Oct-1945 MRN: [id number]  Primary Cardiologist: Julien Nordmann, MD  Chart reviewed as part of pre-operative protocol coverage. Given past medical history and time since last visit, based on ACC/AHA guidelines, Bianca Fry is at acceptable risk for the planned procedure without further cardiovascular testing.   Per [name], NP "At that time of her visit, patient was able to achieve >4.0 METs with activity around her home and walking for exercise. She is pending carotid US but that should not delay her surgery. She is an acceptable risk for upcoming hip surgery without further cardiac testing."  I will route this recommendation to the requesting party via Epic fax function and remove from pre-op pool.  Please call with questions.  Denyce Robert, NP 12/02/2023, 12:08 PM

## 2023-12-02 NOTE — Telephone Encounter (Signed)
-----   Message from Verlee Monte sent at 12/02/2023  9:25 AM EST ----- Regarding: Request for pre-operative cardiac clearance Request for pre-operative cardiac clearance:  1. What type of surgery is being performed?  TOTAL HIP ARTHROPLASTY  2. When is this surgery scheduled?  12/15/2023  3. Type of clearance being requested (medical, pharmacy, both)? MEDICAL   4. Are there any medications that need to be held prior to surgery? None - remaining on low dose ASA throughout her perioperative course.   5. Practice name and name of physician performing surgery?  Performing surgeon: Dr. Francesco Sor, MD Requesting clearance: Quentin Mulling, FNP-C    6. Anesthesia type (none, local, MAC, general)? GENERAL  7. What is the office phone and fax number?   Phone: [phone] Fax: [phone]  ATTENTION: Unable to create telephone message as per your standard workflow. Directed by [facility] providers to send requests for cardiac clearance to this pool for appropriate distribution to provider covering pre-operative clearances.   Quentin Mulling, MSN, APRN, FNP-C, CEN [facility]  Peri-operative Services Nurse Practitioner Phone: [phone] 12/02/23 9:25 AM

## 2023-12-02 NOTE — Patient Instructions (Addendum)
Your procedure is scheduled on: Wednesday, February 26 Report to the Registration Desk on the 1st floor of the [facility] To find out your arrival time, please call [phone] between 1PM - 3PM on: Tuesday, February 25 If your arrival time is 6:00 am, do not arrive before that time as the [facility] entrance doors do not open until 6:00 am.  REMEMBER: Instructions that are not followed completely may result in serious medical risk, up to and including death; or upon the discretion of your surgeon and anesthesiologist your surgery may need to be rescheduled.  Do not eat food after midnight the night before surgery.  No gum chewing or hard candies.  You may however, drink CLEAR liquids up to 2 hours before you are scheduled to arrive for your surgery. Do not drink anything within 2 hours of your scheduled arrival time.  Clear liquids include: - water  - apple juice without pulp - gatorade (not RED colors) - black coffee or tea (Do NOT add milk or creamers to the coffee or tea) Do NOT drink anything that is not on this list.  In addition, your doctor has ordered for you to drink the provided:  Ensure Pre-Surgery Clear Carbohydrate Drink  Drinking this carbohydrate drink up to two hours before surgery helps to reduce insulin resistance and improve patient outcomes. Please complete drinking 2 hours before scheduled arrival time.  One week prior to surgery: starting February 19 Stop Anti-inflammatories (NSAIDS) such as Advil, Aleve, Ibuprofen, Motrin, Naproxen, Naprosyn and Aspirin based products such as Excedrin, Goody's Powder, BC Powder. Stop ANY OVER THE COUNTER supplements until after surgery. Stop probiotic, calcium, vitamin D, vitamin B.  You may however, continue to take Tylenol if needed for pain up until the day of surgery.  You may continue taking the 81 mg aspirin.  Continue taking all of your other prescription medications up until the day of surgery.  ON THE DAY OF  SURGERY ONLY TAKE THESE MEDICATIONS WITH SIPS OF WATER:  ALPRAZolam Prudy Feeler) if needed for anxiety carvedilol (COREG)  esomeprazole (NEXIUM)  No Alcohol for 24 hours before or after surgery.  No Smoking including e-cigarettes for 24 hours before surgery.  No chewable tobacco products for at least 6 hours before surgery.  No nicotine patches on the day of surgery.  Do not use any "recreational" drugs for at least a week (preferably 2 weeks) before your surgery.  Please be advised that the combination of cocaine and anesthesia may have negative outcomes, up to and including death. If you test positive for cocaine, your surgery will be cancelled.  On the morning of surgery brush your teeth with toothpaste and water, you may rinse your mouth with mouthwash if you wish. Do not swallow any toothpaste or mouthwash.  Use CHG Soap as directed on instruction sheet.  Do not wear jewelry, make-up, hairpins, clips or nail polish.  For welded (permanent) jewelry: bracelets, anklets, waist bands, etc.  Please have this removed prior to surgery.  If it is not removed, there is a chance that hospital personnel will need to cut it off on the day of surgery.  Do not wear lotions, powders, or perfumes.   Do not shave body hair from the neck down 48 hours before surgery.  Contact lenses, hearing aids and dentures may not be worn into surgery.  Do not bring valuables to the hospital. [facility] is not responsible for any missing/lost belongings or valuables.   Notify your doctor if there  is any change in your medical condition (cold, fever, infection).  Wear comfortable clothing (specific to your surgery type) to the hospital.  After surgery, you can help prevent lung complications by doing breathing exercises.  Take deep breaths and cough every 1-2 hours. Your doctor may order a device called an Incentive Spirometer to help you take deep breaths.  If you are being admitted to the hospital  overnight, leave your suitcase in the car. After surgery it may be brought to your room.  In case of increased patient census, it may be necessary for you, the patient, to continue your postoperative care in the Same Day Surgery department.  If you are being discharged the day of surgery, you will not be allowed to drive home. You will need a responsible individual to drive you home and stay with you for 24 hours after surgery.   If you are taking public transportation, you will need to have a responsible individual with you.  Please call the Pre-admissions Testing Dept. at [phone] if you have any questions about these instructions.  Surgery Visitation Policy:  Patients having surgery or a procedure may have two visitors.  Children under the age of 61 must have an adult with them who is not the patient.  Temporary Visitor Restrictions Due to increasing cases of flu, RSV and COVID-19: Children ages 33 and under will not be able to visit patients in [facility] hospitals under most circumstances.  Inpatient Visitation:    Visiting hours are 7 a.m. to 8 p.m. Up to four visitors are allowed at one time in a patient room. The visitors may rotate out with other people during the day.  One visitor age 34 or older may stay with the patient overnight and must be in the room by 8 p.m.     Pre-operative 5 CHG Bath Instructions   You can play a key role in reducing the risk of infection after surgery. Your skin needs to be as free of germs as possible. You can reduce the number of germs on your skin by washing with CHG (chlorhexidine gluconate) soap before surgery. CHG is an antiseptic soap that kills germs and continues to kill germs even after washing.   DO NOT use if you have an allergy to chlorhexidine/CHG or antibacterial soaps. If your skin becomes reddened or irritated, stop using the CHG and notify one of our RNs at [phone].   Please shower with the CHG soap starting 4 days  before surgery using the following schedule:     Please keep in mind the following:  DO NOT shave, including legs and underarms, starting the day of your first shower.   You may shave your face at any point before/day of surgery.  Place clean sheets on your bed the day you start using CHG soap. Use a clean washcloth (not used since being washed) for each shower. DO NOT sleep with pets once you start using the CHG.   CHG Shower Instructions:  If you choose to wash your hair and private area, wash first with your normal shampoo/soap.  After you use shampoo/soap, rinse your hair and body thoroughly to remove shampoo/soap residue.  Turn the water OFF and apply about 3 tablespoons (45 ml) of CHG soap to a CLEAN washcloth.  Apply CHG soap ONLY FROM YOUR NECK DOWN TO YOUR TOES (washing for 3-5 minutes)  DO NOT use CHG soap on face, private areas, open wounds, or sores.  Pay special attention to the  area where your surgery is being performed.  If you are having back surgery, having someone wash your back for you may be helpful. Wait 2 minutes after CHG soap is applied, then you may rinse off the CHG soap.  Pat dry with a clean towel  Put on clean clothes/pajamas   If you choose to wear lotion, please use ONLY the CHG-compatible lotions on the back of this paper.     Additional instructions for the day of surgery: DO NOT APPLY any lotions, deodorants, cologne, or perfumes.   Put on clean/comfortable clothes.  Brush your teeth.  Ask your nurse before applying any prescription medications to the skin.      CHG Compatible Lotions   Aveeno Moisturizing lotion  Cetaphil Moisturizing Cream  Cetaphil Moisturizing Lotion  Clairol Herbal Essence Moisturizing Lotion, Dry Skin  Clairol Herbal Essence Moisturizing Lotion, Extra Dry Skin  Clairol Herbal Essence Moisturizing Lotion, Normal Skin  Curel Age Defying Therapeutic Moisturizing Lotion with Alpha Hydroxy  Curel Extreme Care Body Lotion   Curel Soothing Hands Moisturizing Hand Lotion  Curel Therapeutic Moisturizing Cream, Fragrance-Free  Curel Therapeutic Moisturizing Lotion, Fragrance-Free  Curel Therapeutic Moisturizing Lotion, Original Formula  Eucerin Daily Replenishing Lotion  Eucerin Dry Skin Therapy Plus Alpha Hydroxy Crme  Eucerin Dry Skin Therapy Plus Alpha Hydroxy Lotion  Eucerin Original Crme  Eucerin Original Lotion  Eucerin Plus Crme Eucerin Plus Lotion  Eucerin TriLipid Replenishing Lotion  Keri Anti-Bacterial Hand Lotion  Keri Deep Conditioning Original Lotion Dry Skin Formula Softly Scented  Keri Deep Conditioning Original Lotion, Fragrance Free Sensitive Skin Formula  Keri Lotion Fast Absorbing Fragrance Free Sensitive Skin Formula  Keri Lotion Fast Absorbing Softly Scented Dry Skin Formula  Keri Original Lotion  Keri Skin Renewal Lotion Keri Silky Smooth Lotion  Keri Silky Smooth Sensitive Skin Lotion  Nivea Body Creamy Conditioning Oil  Nivea Body Extra Enriched Lotion  Nivea Body Original Lotion  Nivea Body Sheer Moisturizing Lotion Nivea Crme  Nivea Skin Firming Lotion  NutraDerm 30 Skin Lotion  NutraDerm Skin Lotion  NutraDerm Therapeutic Skin Cream  NutraDerm Therapeutic Skin Lotion  ProShield Protective Hand Cream  Provon moisturizing lotion     Preoperative Educational Videos for Total Hip, Knee and Shoulder Replacements  To better prepare for surgery, please view our videos that explain the physical activity and discharge planning required to have the best surgical recovery at [facility].  [email or web address]  Questions? Call [phone] or email [email or web address]

## 2023-12-03 DIAGNOSIS — N3000 Acute cystitis without hematuria: Secondary | ICD-10-CM | POA: Diagnosis not present

## 2023-12-04 LAB — URINE CULTURE: Culture: >=100000 — AB

## 2023-12-05 ENCOUNTER — Other Ambulatory Visit: Payer: Self-pay | Admitting: Cardiovascular Disease

## 2023-12-05 LAB — IGE: IgE (Immunoglobulin E), Serum: 567 [IU]/mL — ABNORMAL HIGH (ref 6–495)

## 2023-12-05 NOTE — Discharge Instructions (Addendum)
 Instructions after Total Hip Replacement   James P. Angie Fava., M.D.    Dept. of Orthopaedics & Sports Medicine [facility] [address] [place], [place]  [address]  Phone: [phone]   Fax: [phone]        [email or web address]        DIET: Drink plenty of non-alcoholic fluids. Resume your normal diet. Include foods high in fiber.  ACTIVITY:  You may use crutches or a walker with weight-bearing as tolerated, unless instructed otherwise. You may be weaned off of the walker or crutches by your Physical Therapist.  Do NOT reach below the level of your knees or cross your legs until allowed.    Continue doing gentle exercises. Exercising will reduce the pain and swelling, increase motion, and prevent muscle weakness.   Please continue to use the TED compression stockings for 6 weeks. You may remove the stockings at night, but should reapply them in the morning. Do not drive or operate any equipment until instructed.  WOUND CARE:  Continue to use ice packs periodically to reduce pain and swelling. Keep the incision clean and dry. You may bathe or shower after the staples are removed at the first office visit following surgery.  MEDICATIONS: You may resume your regular medications. Please take the pain medication as prescribed on the medication. Do not take pain medication on an empty stomach. You have been given a prescription for a blood thinner to prevent blood clots. Please take the medication as instructed. (NOTE: After completing a 2 week course of Lovenox, take one Enteric-coated aspirin once a day.) Pain medications and iron supplements can cause constipation. Use a stool softener (Senokot or Colace) on a daily basis and a laxative (dulcolax or miralax) as needed. Do not drive or drink alcoholic beverages when taking pain medications.  CALL THE OFFICE FOR: Temperature above 101 degrees Excessive bleeding or drainage on the dressing. Excessive swelling,  coldness, or paleness of the toes. Persistent nausea and vomiting.  FOLLOW-UP:  You should have an appointment to return to the office in 6 weeks after surgery. Arrangements have been made for continuation of Physical Therapy (either home therapy or outpatient therapy).     [facility] Department Directory         [email or web address]       [email or web address]          Cardiology  Appointments: [place] - [phone]  Endocrinology  Appointments: [place] [phone] [place] - [phone]  Gastroenterology  Appointments: [place] [phone] [place] - [phone]        General Surgery   Appointments: [facility]  Internal Medicine/Family Medicine  Appointments: [facility] [place] - [phone] [place] - [phone]  Metabolic and Weigh Loss Surgery  Appointments: [facility]        Neurology  Appointments: [place] [phone] [place] - [phone]  Neurosurgery  Appointments: [place]  Obstetrics & Gynecology  Appointments: [place] [phone] [place] - [phone]        Pediatrics  Appointments: [place] [phone] [place] - [phone]  Physiatry  Appointments: [place] [phone]  Physical Therapy  Appointments: [place] - [phone]        Podiatry  Appointments: [place] [phone] [place] - [phone]  Pulmonology  Appointments: [place]  Rheumatology  Appointments: [place] [phone]        [place] Location: [facility]  [address] [place], [place]  [address]  [place] Location: [facility] [address] [place], [place]  [address]  [place] Location: [facility] [address] [place], [place]  [address]     [employer]  [address] , [place], [place], [address]. [phone] They will  call you to arrange when they can come to see you

## 2023-12-08 DIAGNOSIS — M1611 Unilateral primary osteoarthritis, right hip: Secondary | ICD-10-CM | POA: Diagnosis not present

## 2023-12-09 ENCOUNTER — Other Ambulatory Visit: Payer: Self-pay

## 2023-12-09 DIAGNOSIS — R112 Nausea with vomiting, unspecified: Secondary | ICD-10-CM

## 2023-12-09 NOTE — Telephone Encounter (Signed)
 Zofran  Last rx:  11/11/23, #20  Last OV:  11/29/23, hosp f/u Next OV:  02/16/24, 3 mo f/u

## 2023-12-10 MED ORDER — ONDANSETRON HCL 4 MG PO TABS: ORAL_TABLET | ORAL | 0 refills | Status: DC
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
## 2023-12-13 ENCOUNTER — Encounter: Payer: Self-pay | Admitting: Orthopedic Surgery
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
## 2023-12-14 ENCOUNTER — Ambulatory Visit: Payer: PPO | Attending: Student

## 2023-12-14 ENCOUNTER — Encounter: Payer: Self-pay | Admitting: Orthopedic Surgery

## 2023-12-14 DIAGNOSIS — I6522 Occlusion and stenosis of left carotid artery: Secondary | ICD-10-CM

## 2023-12-14 MED ORDER — DEXAMETHASONE SODIUM PHOSPHATE 10 MG/ML IJ SOLN
8.0000 mg | Freq: Once | INTRAMUSCULAR | Status: AC
  Administered 2023-12-15: 8 mg via INTRAVENOUS

## 2023-12-14 MED ORDER — CHLORHEXIDINE GLUCONATE 0.12 % MT SOLN
15.0000 mL | Freq: Once | OROMUCOSAL | Status: AC
Start: 2023-12-14 — End: 2023-12-15
  Administered 2023-12-15: 15 mL via OROMUCOSAL

## 2023-12-14 MED ORDER — LACTATED RINGERS IV SOLN
INTRAVENOUS | Status: DC
Start: 2023-12-14 — End: 2023-12-15

## 2023-12-14 MED ORDER — GABAPENTIN 300 MG PO CAPS
300.0000 mg | ORAL_CAPSULE | Freq: Once | ORAL | Status: AC
  Administered 2023-12-15: 300 mg via ORAL

## 2023-12-14 MED ORDER — CHLORHEXIDINE GLUCONATE 4 % EX SOLN
60.0000 mL | Freq: Once | CUTANEOUS | Status: AC
  Administered 2023-12-15: 4 via TOPICAL

## 2023-12-14 MED ORDER — CELECOXIB 200 MG PO CAPS
400.0000 mg | ORAL_CAPSULE | Freq: Once | ORAL | Status: AC
  Administered 2023-12-15: 400 mg via ORAL

## 2023-12-14 MED ORDER — ORAL CARE MOUTH RINSE
15.0000 mL | Freq: Once | OROMUCOSAL | Status: AC
Start: 2023-12-14 — End: 2023-12-15

## 2023-12-14 MED ORDER — CEFAZOLIN SODIUM-DEXTROSE 2-4 GM/100ML-% IV SOLN
2.0000 g | INTRAVENOUS | Status: AC
  Administered 2023-12-15: 2 g via INTRAVENOUS

## 2023-12-14 MED ORDER — TRANEXAMIC ACID-NACL 1000-0.7 MG/100ML-% IV SOLN
1000.0000 mg | INTRAVENOUS | Status: AC
  Administered 2023-12-15: 1000 mg via INTRAVENOUS

## 2023-12-15 ENCOUNTER — Ambulatory Visit: Payer: PPO | Admitting: Urgent Care

## 2023-12-15 ENCOUNTER — Observation Stay
Admission: RE | Admit: 2023-12-15 | Discharge: 2023-12-16 | Disposition: A | Discharge status: Home / Self Care | Payer: PPO | Attending: Orthopedic Surgery | Admitting: Orthopedic Surgery

## 2023-12-15 ENCOUNTER — Observation Stay: Payer: PPO

## 2023-12-15 ENCOUNTER — Other Ambulatory Visit: Payer: Self-pay

## 2023-12-15 ENCOUNTER — Encounter: Payer: Self-pay | Admitting: Orthopedic Surgery

## 2023-12-15 ENCOUNTER — Encounter
Admission: RE | Disposition: A | Discharge status: Home / Self Care | Payer: Self-pay | Source: Home / Self Care | Attending: Orthopedic Surgery

## 2023-12-15 DIAGNOSIS — M1611 Unilateral primary osteoarthritis, right hip: Principal | ICD-10-CM

## 2023-12-15 DIAGNOSIS — Z85828 Personal history of other malignant neoplasm of skin: Secondary | ICD-10-CM | POA: Insufficient documentation

## 2023-12-15 DIAGNOSIS — R8271 Bacteriuria: Secondary | ICD-10-CM

## 2023-12-15 DIAGNOSIS — D696 Thrombocytopenia, unspecified: Secondary | ICD-10-CM

## 2023-12-15 DIAGNOSIS — I10 Essential (primary) hypertension: Secondary | ICD-10-CM | POA: Diagnosis not present

## 2023-12-15 DIAGNOSIS — N390 Urinary tract infection, site not specified: Secondary | ICD-10-CM

## 2023-12-15 DIAGNOSIS — Z96641 Presence of right artificial hip joint: Secondary | ICD-10-CM | POA: Diagnosis not present

## 2023-12-15 DIAGNOSIS — I959 Hypotension, unspecified: Secondary | ICD-10-CM

## 2023-12-15 DIAGNOSIS — Z471 Aftercare following joint replacement surgery: Secondary | ICD-10-CM | POA: Diagnosis not present

## 2023-12-15 DIAGNOSIS — R8290 Unspecified abnormal findings in urine: Secondary | ICD-10-CM

## 2023-12-15 DIAGNOSIS — Z01812 Encounter for preprocedural laboratory examination: Secondary | ICD-10-CM

## 2023-12-15 DIAGNOSIS — D649 Anemia, unspecified: Secondary | ICD-10-CM

## 2023-12-15 DIAGNOSIS — D509 Iron deficiency anemia, unspecified: Secondary | ICD-10-CM

## 2023-12-15 DIAGNOSIS — R002 Palpitations: Secondary | ICD-10-CM

## 2023-12-15 HISTORY — DX: Cataract extraction status, right eye: Z98.41

## 2023-12-15 HISTORY — DX: Mixed incontinence: N39.46

## 2023-12-15 HISTORY — DX: Thrombocytopenia, unspecified: D69.6

## 2023-12-15 HISTORY — DX: Occipital neuralgia: M54.81

## 2023-12-15 HISTORY — DX: Prediabetes: R73.03

## 2023-12-15 HISTORY — PX: TOTAL HIP ARTHROPLASTY: SHX124

## 2023-12-15 HISTORY — DX: Other ill-defined heart diseases: I51.89

## 2023-12-15 HISTORY — DX: Supraventricular tachycardia, unspecified: I47.10

## 2023-12-15 HISTORY — DX: Fatty (change of) liver, not elsewhere classified: K76.0

## 2023-12-15 HISTORY — DX: Other intervertebral disc degeneration, lumbar region without mention of lumbar back pain or lower extremity pain: M51.369

## 2023-12-15 HISTORY — DX: Other cerebrovascular disease: I67.89

## 2023-12-15 SURGERY — ARTHROPLASTY, HIP, TOTAL,POSTERIOR APPROACH
Anesthesia: Spinal | Site: Hip | Laterality: Right

## 2023-12-15 MED ORDER — SUCRALFATE 1 G PO TABS: 1.0000 g | ORAL_TABLET | Freq: Three times a day (TID) | ORAL | Status: DC | PRN

## 2023-12-15 MED ORDER — PROPOFOL 1000 MG/100ML IV EMUL
INTRAVENOUS | Status: AC
  Filled 2023-12-15: qty 100

## 2023-12-15 MED ORDER — 0.9 % SODIUM CHLORIDE (POUR BTL) OPTIME
TOPICAL | Status: DC | PRN
  Administered 2023-12-15: 500 mL

## 2023-12-15 MED ORDER — PHENYLEPHRINE HCL-NACL 20-0.9 MG/250ML-% IV SOLN
INTRAVENOUS | Status: DC | PRN
  Administered 2023-12-15: 40 ug/min via INTRAVENOUS

## 2023-12-15 MED ORDER — TRAMADOL HCL 50 MG PO TABS
50.0000 mg | ORAL_TABLET | ORAL | Status: DC | PRN
  Administered 2023-12-15: 50 mg via ORAL
  Administered 2023-12-15 – 2023-12-16 (×2): 100 mg via ORAL
  Filled 2023-12-15: qty 2
  Filled 2023-12-15: qty 1
  Filled 2023-12-15: qty 2

## 2023-12-15 MED ORDER — ACETAMINOPHEN 10 MG/ML IV SOLN: 1000.0000 mg | Freq: Once | INTRAVENOUS | Status: DC | PRN

## 2023-12-15 MED ORDER — LOSARTAN POTASSIUM 50 MG PO TABS
75.0000 mg | ORAL_TABLET | Freq: Every day | ORAL | Status: DC
  Administered 2023-12-15 – 2023-12-16 (×2): 75 mg via ORAL
  Filled 2023-12-15 (×2): qty 2

## 2023-12-15 MED ORDER — SURGIPHOR WOUND IRRIGATION SYSTEM - OPTIME: TOPICAL | Status: DC | PRN

## 2023-12-15 MED ORDER — SENNOSIDES-DOCUSATE SODIUM 8.6-50 MG PO TABS
1.0000 | ORAL_TABLET | Freq: Two times a day (BID) | ORAL | Status: DC
  Administered 2023-12-15 – 2023-12-16 (×2): 1 via ORAL
  Filled 2023-12-15 (×2): qty 1

## 2023-12-15 MED ORDER — ONDANSETRON HCL 4 MG/2ML IJ SOLN
INTRAMUSCULAR | Status: DC | PRN
  Administered 2023-12-15: 4 mg via INTRAVENOUS

## 2023-12-15 MED ORDER — DIPHENHYDRAMINE HCL 12.5 MG/5ML PO ELIX: 12.5000 mg | ORAL_SOLUTION | ORAL | Status: DC | PRN

## 2023-12-15 MED ORDER — EPHEDRINE SULFATE-NACL 50-0.9 MG/10ML-% IV SOSY
PREFILLED_SYRINGE | INTRAVENOUS | Status: DC | PRN
  Administered 2023-12-15: 10 mg via INTRAVENOUS
  Administered 2023-12-15: 5 mg via INTRAVENOUS

## 2023-12-15 MED ORDER — BISACODYL 10 MG RE SUPP: 10.0000 mg | Freq: Every day | RECTAL | Status: DC | PRN

## 2023-12-15 MED ORDER — PHENYLEPHRINE 80 MCG/ML (10ML) SYRINGE FOR IV PUSH (FOR BLOOD PRESSURE SUPPORT)
PREFILLED_SYRINGE | INTRAVENOUS | Status: DC | PRN
  Administered 2023-12-15: 40 ug via INTRAVENOUS
  Administered 2023-12-15 (×4): 80 ug via INTRAVENOUS

## 2023-12-15 MED ORDER — GABAPENTIN 300 MG PO CAPS
ORAL_CAPSULE | ORAL | Status: AC
  Filled 2023-12-15: qty 1

## 2023-12-15 MED ORDER — TRANEXAMIC ACID-NACL 1000-0.7 MG/100ML-% IV SOLN
INTRAVENOUS | Status: AC
  Filled 2023-12-15: qty 100

## 2023-12-15 MED ORDER — SODIUM CHLORIDE 0.9 % IR SOLN
Status: DC | PRN
  Administered 2023-12-15: 3000 mL

## 2023-12-15 MED ORDER — TRANEXAMIC ACID-NACL 1000-0.7 MG/100ML-% IV SOLN
1000.0000 mg | Freq: Once | INTRAVENOUS | Status: AC
  Administered 2023-12-15: 1000 mg via INTRAVENOUS

## 2023-12-15 MED ORDER — PHENYLEPHRINE HCL-NACL 20-0.9 MG/250ML-% IV SOLN
INTRAVENOUS | Status: AC
  Filled 2023-12-15: qty 250

## 2023-12-15 MED ORDER — BUPIVACAINE HCL (PF) 0.5 % IJ SOLN
INTRAMUSCULAR | Status: DC | PRN
  Administered 2023-12-15: 3 mL

## 2023-12-15 MED ORDER — ONDANSETRON HCL 4 MG/2ML IJ SOLN: 4.0000 mg | Freq: Four times a day (QID) | INTRAMUSCULAR | Status: DC | PRN

## 2023-12-15 MED ORDER — CEFAZOLIN SODIUM-DEXTROSE 2-4 GM/100ML-% IV SOLN
INTRAVENOUS | Status: AC
  Filled 2023-12-15: qty 100

## 2023-12-15 MED ORDER — ASPIRIN 81 MG PO CHEW
81.0000 mg | CHEWABLE_TABLET | Freq: Two times a day (BID) | ORAL | Status: DC
  Administered 2023-12-15 – 2023-12-16 (×2): 81 mg via ORAL
  Filled 2023-12-15 (×2): qty 1

## 2023-12-15 MED ORDER — DICYCLOMINE HCL 10 MG PO CAPS: 10.0000 mg | ORAL_CAPSULE | Freq: Two times a day (BID) | ORAL | Status: DC | PRN

## 2023-12-15 MED ORDER — SODIUM CHLORIDE 0.9 % IV SOLN: INTRAVENOUS | Status: DC

## 2023-12-15 MED ORDER — CELECOXIB 200 MG PO CAPS
ORAL_CAPSULE | ORAL | Status: AC
  Filled 2023-12-15: qty 2

## 2023-12-15 MED ORDER — ONDANSETRON HCL 4 MG/2ML IJ SOLN
INTRAMUSCULAR | Status: AC
  Filled 2023-12-15: qty 2

## 2023-12-15 MED ORDER — METOCLOPRAMIDE HCL 10 MG PO TABS
10.0000 mg | ORAL_TABLET | Freq: Three times a day (TID) | ORAL | Status: DC
  Administered 2023-12-15 – 2023-12-16 (×4): 10 mg via ORAL
  Filled 2023-12-15 (×4): qty 1

## 2023-12-15 MED ORDER — MIDAZOLAM HCL 2 MG/2ML IJ SOLN
INTRAMUSCULAR | Status: AC
  Filled 2023-12-15: qty 2

## 2023-12-15 MED ORDER — MAGNESIUM HYDROXIDE 400 MG/5ML PO SUSP
30.0000 mL | Freq: Every day | ORAL | Status: DC
  Filled 2023-12-15: qty 30

## 2023-12-15 MED ORDER — ALPRAZOLAM 0.25 MG PO TABS
0.5000 mg | ORAL_TABLET | Freq: Two times a day (BID) | ORAL | Status: DC | PRN
  Administered 2023-12-15: 0.5 mg via ORAL
  Filled 2023-12-15: qty 2

## 2023-12-15 MED ORDER — DEXAMETHASONE SODIUM PHOSPHATE 10 MG/ML IJ SOLN
INTRAMUSCULAR | Status: AC
  Filled 2023-12-15: qty 1

## 2023-12-15 MED ORDER — FENTANYL CITRATE (PF) 100 MCG/2ML IJ SOLN
INTRAMUSCULAR | Status: AC
  Filled 2023-12-15: qty 2

## 2023-12-15 MED ORDER — CELECOXIB 200 MG PO CAPS
200.0000 mg | ORAL_CAPSULE | Freq: Two times a day (BID) | ORAL | Status: DC
  Administered 2023-12-15 – 2023-12-16 (×2): 200 mg via ORAL
  Filled 2023-12-15 (×2): qty 1

## 2023-12-15 MED ORDER — ALUM & MAG HYDROXIDE-SIMETH 200-200-20 MG/5ML PO SUSP: 30.0000 mL | ORAL | Status: DC | PRN

## 2023-12-15 MED ORDER — OXYCODONE HCL 5 MG PO TABS
5.0000 mg | ORAL_TABLET | ORAL | Status: DC | PRN
  Administered 2023-12-15 – 2023-12-16 (×3): 5 mg via ORAL
  Filled 2023-12-15 (×3): qty 1

## 2023-12-15 MED ORDER — CARVEDILOL 3.125 MG PO TABS
6.2500 mg | ORAL_TABLET | Freq: Two times a day (BID) | ORAL | Status: DC
  Administered 2023-12-15 – 2023-12-16 (×2): 6.25 mg via ORAL
  Filled 2023-12-15 (×2): qty 2

## 2023-12-15 MED ORDER — ACETAMINOPHEN 325 MG PO TABS: 325.0000 mg | ORAL_TABLET | Freq: Four times a day (QID) | ORAL | Status: DC | PRN

## 2023-12-15 MED ORDER — HYDROMORPHONE HCL 1 MG/ML IJ SOLN: 0.5000 mg | INTRAMUSCULAR | Status: DC | PRN

## 2023-12-15 MED ORDER — FENTANYL CITRATE (PF) 100 MCG/2ML IJ SOLN
INTRAMUSCULAR | Status: AC
Start: 2023-12-15 — End: ?
  Filled 2023-12-15: qty 2

## 2023-12-15 MED ORDER — ATORVASTATIN CALCIUM 10 MG PO TABS
10.0000 mg | ORAL_TABLET | Freq: Every day | ORAL | Status: DC
  Administered 2023-12-15: 10 mg via ORAL
  Filled 2023-12-15: qty 1

## 2023-12-15 MED ORDER — ONDANSETRON HCL 4 MG PO TABS: 4.0000 mg | ORAL_TABLET | Freq: Four times a day (QID) | ORAL | Status: DC | PRN

## 2023-12-15 MED ORDER — PHENOL 1.4 % MT LIQD: 1.0000 | OROMUCOSAL | Status: DC | PRN

## 2023-12-15 MED ORDER — OXYCODONE HCL 5 MG PO TABS: 10.0000 mg | ORAL_TABLET | ORAL | Status: DC | PRN

## 2023-12-15 MED ORDER — PANTOPRAZOLE SODIUM 40 MG PO TBEC
40.0000 mg | DELAYED_RELEASE_TABLET | Freq: Two times a day (BID) | ORAL | Status: DC
  Administered 2023-12-15 – 2023-12-16 (×2): 40 mg via ORAL
  Filled 2023-12-15 (×2): qty 1

## 2023-12-15 MED ORDER — ACETAMINOPHEN 10 MG/ML IV SOLN
INTRAVENOUS | Status: DC | PRN
  Administered 2023-12-15: 1000 mg via INTRAVENOUS

## 2023-12-15 MED ORDER — FERROUS SULFATE 325 (65 FE) MG PO TABS
325.0000 mg | ORAL_TABLET | Freq: Two times a day (BID) | ORAL | Status: DC
  Administered 2023-12-15 – 2023-12-16 (×2): 325 mg via ORAL
  Filled 2023-12-15 (×2): qty 1

## 2023-12-15 MED ORDER — CHLORHEXIDINE GLUCONATE 0.12 % MT SOLN
OROMUCOSAL | Status: AC
  Filled 2023-12-15: qty 15

## 2023-12-15 MED ORDER — CEFAZOLIN SODIUM-DEXTROSE 2-4 GM/100ML-% IV SOLN
2.0000 g | Freq: Four times a day (QID) | INTRAVENOUS | Status: AC
  Administered 2023-12-15 (×2): 2 g via INTRAVENOUS
  Filled 2023-12-15 (×2): qty 100

## 2023-12-15 MED ORDER — RISAQUAD PO CAPS
1.0000 | ORAL_CAPSULE | Freq: Every day | ORAL | Status: DC
  Administered 2023-12-16: 1 via ORAL
  Filled 2023-12-15: qty 1

## 2023-12-15 MED ORDER — MENTHOL 3 MG MT LOZG: 1.0000 | LOZENGE | OROMUCOSAL | Status: DC | PRN

## 2023-12-15 MED ORDER — ENSURE PRE-SURGERY PO LIQD
296.0000 mL | Freq: Once | ORAL | Status: AC
  Administered 2023-12-15: 296 mL via ORAL
  Filled 2023-12-15: qty 296

## 2023-12-15 MED ORDER — ONDANSETRON HCL 4 MG/2ML IJ SOLN
4.0000 mg | Freq: Once | INTRAMUSCULAR | Status: AC | PRN
  Administered 2023-12-15: 4 mg via INTRAVENOUS

## 2023-12-15 MED ORDER — LORATADINE 10 MG PO TABS
10.0000 mg | ORAL_TABLET | Freq: Every day | ORAL | Status: DC
  Administered 2023-12-15 – 2023-12-16 (×2): 10 mg via ORAL
  Filled 2023-12-15 (×2): qty 1

## 2023-12-15 MED ORDER — ACETAMINOPHEN 10 MG/ML IV SOLN
1000.0000 mg | Freq: Four times a day (QID) | INTRAVENOUS | Status: AC
  Administered 2023-12-15 – 2023-12-16 (×4): 1000 mg via INTRAVENOUS
  Filled 2023-12-15 (×4): qty 100

## 2023-12-15 MED ORDER — GABAPENTIN 100 MG PO CAPS: 300.0000 mg | ORAL_CAPSULE | Freq: Every day | ORAL | Status: DC

## 2023-12-15 MED ORDER — PROPOFOL 500 MG/50ML IV EMUL
INTRAVENOUS | Status: DC | PRN
  Administered 2023-12-15: 100 ug/kg/min via INTRAVENOUS

## 2023-12-15 MED ORDER — FENTANYL CITRATE (PF) 100 MCG/2ML IJ SOLN
25.0000 ug | INTRAMUSCULAR | Status: DC | PRN
  Administered 2023-12-15: 50 ug via INTRAVENOUS

## 2023-12-15 MED ORDER — FLEET ENEMA RE ENEM: 1.0000 | ENEMA | Freq: Once | RECTAL | Status: DC | PRN

## 2023-12-15 MED ORDER — FENTANYL CITRATE (PF) 100 MCG/2ML IJ SOLN
INTRAMUSCULAR | Status: DC | PRN
  Administered 2023-12-15 (×3): 25 ug via INTRAVENOUS

## 2023-12-15 SURGICAL SUPPLY — 47 items
BLADE CLIPPER SURG (BLADE) IMPLANT
BLADE SAW 90X25X1.19 OSCILLAT (BLADE) ×1 IMPLANT
BRUSH SCRUB EZ PLAIN DRY (MISCELLANEOUS) ×1 IMPLANT
CUP ACETBLR 52 OD 100 SERIES (Hips) IMPLANT
DRAPE INCISE IOBAN 66X60 STRL (DRAPES) ×1 IMPLANT
DRAPE SHEET LG 3/4 BI-LAMINATE (DRAPES) ×1 IMPLANT
DRSG AQUACEL AG ADV 3.5X14 (GAUZE/BANDAGES/DRESSINGS) ×1 IMPLANT
DRSG MEPILEX SACRM 8.7X9.8 (GAUZE/BANDAGES/DRESSINGS) ×1 IMPLANT
DRSG TEGADERM 4X4.75 (GAUZE/BANDAGES/DRESSINGS) ×1 IMPLANT
DURAPREP 26ML APPLICATOR (WOUND CARE) ×2 IMPLANT
ELECT CAUTERY BLADE 6.4 (BLADE) ×1 IMPLANT
ELECT REM PT RETURN 9FT ADLT (ELECTROSURGICAL) ×1 IMPLANT
ELECTRODE REM PT RTRN 9FT ADLT (ELECTROSURGICAL) ×1 IMPLANT
EVACUATOR 1/8 PVC DRAIN (DRAIN) ×1 IMPLANT
GLOVE BIOGEL M STRL SZ7.5 (GLOVE) ×6 IMPLANT
GLOVE SURG UNDER POLY LF SZ8 (GLOVE) ×2 IMPLANT
GOWN STRL REUS W/ TWL LRG LVL3 (GOWN DISPOSABLE) ×2 IMPLANT
GOWN STRL REUS W/ TWL XL LVL3 (GOWN DISPOSABLE) ×1 IMPLANT
GOWN TOGA ZIPPER T7+ PEEL AWAY (MISCELLANEOUS) ×1 IMPLANT
HANDLE YANKAUER SUCT OPEN TIP (MISCELLANEOUS) ×1 IMPLANT
HEAD M SROM 36MM PLUS 1.5 (Hips) IMPLANT
HOLDER FOLEY CATH W/STRAP (MISCELLANEOUS) ×1 IMPLANT
HOOD PEEL AWAY T7 (MISCELLANEOUS) ×1 IMPLANT
IV NS IRRIG 3000ML ARTHROMATIC (IV SOLUTION) ×1 IMPLANT
KIT PEG BOARD PINK (KITS) ×1 IMPLANT
KIT TURNOVER KIT A (KITS) ×1 IMPLANT
LINER MARATHON 4MM 10DEG 36X52 (Hips) IMPLANT
MANIFOLD NEPTUNE II (INSTRUMENTS) ×2 IMPLANT
NS IRRIG 500ML POUR BTL (IV SOLUTION) ×1 IMPLANT
PACK HIP PROSTHESIS (MISCELLANEOUS) ×1 IMPLANT
PIN STEIN THRED 5/32 (Pin) ×1 IMPLANT
PULSAVAC PLUS IRRIG FAN TIP (DISPOSABLE) ×1 IMPLANT
SOLUTION IRRIG SURGIPHOR (IV SOLUTION) ×1 IMPLANT
SPONGE DRAIN TRACH 4X4 STRL 2S (GAUZE/BANDAGES/DRESSINGS) ×1 IMPLANT
SROM M HEAD 36MM PLUS 1.5 (Hips) ×1 IMPLANT
STAPLER SKIN PROX 35W (STAPLE) ×1 IMPLANT
STEM FEM ACTIS STD SZ4 (Stem) IMPLANT
SUT ETHIBOND #5 BRAIDED 30INL (SUTURE) ×1 IMPLANT
SUT VIC AB 0 CT1 36 (SUTURE) ×2 IMPLANT
SUT VIC AB 1 CT1 36 (SUTURE) ×2 IMPLANT
SUT VIC AB 2-0 CT1 TAPERPNT 27 (SUTURE) ×1 IMPLANT
TAPE CLOTH 3X10 WHT NS LF (GAUZE/BANDAGES/DRESSINGS) ×1 IMPLANT
TIP FAN IRRIG PULSAVAC PLUS (DISPOSABLE) ×1 IMPLANT
TOWEL OR 17X26 4PK STRL BLUE (TOWEL DISPOSABLE) IMPLANT
TRAP FLUID SMOKE EVACUATOR (MISCELLANEOUS) ×1 IMPLANT
TRAY FOLEY MTR SLVR 16FR STAT (SET/KITS/TRAYS/PACK) ×1 IMPLANT
WATER STERILE IRR 1000ML POUR (IV SOLUTION) ×1 IMPLANT

## 2023-12-15 NOTE — Interval H&P Note (Signed)
 History and Physical Interval Note:  12/15/2023 6:10 AM  Bianca Fry  has presented today for surgery, with the diagnosis of PRIMARY OSTEOARTHRITIS FO RIGHT HIP.  The various methods of treatment have been discussed with the patient and family. After consideration of risks, benefits and other options for treatment, the patient has consented to  Procedure(s): TOTAL HIP ARTHROPLASTY (Right) as a surgical intervention.  The patient's history has been reviewed, patient examined, no change in status, stable for surgery.  I have reviewed the patient's chart and labs.  Questions were answered to the patient's satisfaction.     Tamelia Michalowski P Oluwateniola Leitch

## 2023-12-15 NOTE — Evaluation (Signed)
 Physical Therapy Evaluation Patient Details Name: Bianca Fry MRN: [id number] DOB: 08-Aug-1946 Today's Date: 12/15/2023  History of Present Illness  Pt is a 78 yo female s/p R THA. PMH of smoking, HTN, CAD, PVD, anxiety, hiatal hernia, HLD.  Clinical Impression  Patient alert, agreeable to PT, reported 6/10 R hip pain. At baseline she reported she is independent, will have family support at discharge. She was able to perform a few therapeutic exercises with assist due to R hip pain. Supine to sit with minA, good sitting balance. Sit <> stand and step pivot with CGA, cues for hand placement and to avoid lifting walker. Pt able to void and then take several steps to recliner in room.  Overall the patient demonstrated deficits (see "PT Problem List") that impede the patient's functional abilities, safety, and mobility and would benefit from skilled PT intervention.          If plan is discharge home, recommend the following: A little help with walking and/or transfers;A little help with bathing/dressing/bathroom;Assistance with cooking/housework;Assist for transportation;Help with stairs or ramp for entrance   Can travel by private vehicle        Equipment Recommendations Other (comment) (per pt she has a RW and BSC at home)  Recommendations for Other Services       Functional Status Assessment Patient has had a recent decline in their functional status and demonstrates the ability to make significant improvements in function in a reasonable and predictable amount of time.     Precautions / Restrictions Precautions Precautions: Fall;Posterior Hip Precaution Booklet Issued: Yes (comment) Recall of Precautions/Restrictions: Intact Restrictions Weight Bearing Restrictions Per Provider Order: No      Mobility  Bed Mobility Overal bed mobility: Needs Assistance Bed Mobility: Supine to Sit     Supine to sit: Min assist     General bed mobility comments: minA for RLE     Transfers Overall transfer level: Needs assistance Equipment used: Rolling walker (2 wheels) Transfers: Sit to/from Stand, Bed to chair/wheelchair/BSC Sit to Stand: Contact guard assist   Step pivot transfers: Contact guard assist       General transfer comment: step pivot to [facility]    Ambulation/Gait Ambulation/Gait assistance: Contact guard assist Gait Distance (Feet): 3 Feet           General Gait Details: pt tended to lift RW  Stairs            Wheelchair Mobility     Tilt Bed    Modified Rankin (Stroke Patients Only)       Balance Overall balance assessment: Needs assistance Sitting-balance support: Feet supported Sitting balance-Leahy Scale: Good Sitting balance - Comments: pericare in sitting   Standing balance support: Bilateral upper extremity supported Standing balance-Leahy Scale: Fair                               Pertinent Vitals/Pain Pain Assessment Pain Assessment: 0-10 Pain Score: 6  Pain Location: R hip Pain Descriptors / Indicators: Sore Pain Intervention(s): Limited activity within patient's tolerance, Monitored during session, Premedicated before session, Patient requesting pain meds-RN notified, RN gave pain meds during session    Home Living Family/patient expects to be discharged to:: Private residence Living Arrangements: Spouse/significant other Available Help at Discharge: Family Type of Home: House Home Access: Stairs to enter Entrance Stairs-Rails: Right;Left;Can reach both Entrance Stairs-Number of Steps: 2   Home Layout: One level Home Equipment: Agricultural consultant (2  wheels);BSC/3in1      Prior Function Prior Level of Function : Independent/Modified Independent                     Extremity/Trunk Assessment   Upper Extremity Assessment Upper Extremity Assessment: Defer to OT evaluation    Lower Extremity Assessment Lower Extremity Assessment: Generalized weakness (assistance to move RLE  against gravity)       Communication        Cognition Arousal: Alert Behavior During Therapy: WFL for tasks assessed/performed   PT - Cognitive impairments: No apparent impairments                                 Cueing       General Comments      Exercises Total Joint Exercises Ankle Circles/Pumps: AROM, Both, 10 reps Heel Slides: AAROM, Strengthening, Right, 10 reps Hip ABduction/ADduction: AAROM, Strengthening, Right, 10 reps   Assessment/Plan    PT Assessment Patient needs continued PT services  PT Problem List Decreased strength;Decreased range of motion;Decreased activity tolerance;Decreased balance;Decreased mobility;Decreased knowledge of precautions;Pain;Decreased knowledge of use of DME       PT Treatment Interventions Balance training;DME instruction;Gait training;Neuromuscular re-education;Stair training;Patient/family education;Functional mobility training;Therapeutic activities;Therapeutic exercise    PT Goals (Current goals can be found in the Care Plan section)  Acute Rehab PT Goals Patient Stated Goal: to go home PT Goal Formulation: With patient Time For Goal Achievement: 12/29/23 Potential to Achieve Goals: Good    Frequency BID     Co-evaluation               AM-PAC PT "6 Clicks" Mobility  Outcome Measure Help needed turning from your back to your side while in a flat bed without using bedrails?: A Little Help needed moving from lying on your back to sitting on the side of a flat bed without using bedrails?: A Little Help needed moving to and from a bed to a chair (including a wheelchair)?: A Little Help needed standing up from a chair using your arms (e.g., wheelchair or bedside chair)?: A Little Help needed to walk in hospital room?: A Little Help needed climbing 3-5 steps with a railing? : A Little 6 Click Score: 18    End of Session Equipment Utilized During Treatment: Gait belt Activity Tolerance: Patient  tolerated treatment well Patient left: with call bell/phone within reach;with family/visitor present;in chair Nurse Communication: Mobility status PT Visit Diagnosis: Other abnormalities of gait and mobility (R26.89);Difficulty in walking, not elsewhere classified (R26.2);Muscle weakness (generalized) (M62.81);Pain Pain - Right/Left: Right Pain - part of body: Hip    Time: 5284-1324 PT Time Calculation (min) (ACUTE ONLY): 23 min   Charges:   PT Evaluation $PT Eval Low Complexity: 1 Low PT Treatments $Therapeutic Activity: 8-22 mins PT General Charges $$ ACUTE PT VISIT: 1 Visit        Olga Coaster PT, DPT 3:51 PM,12/15/23
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
## 2023-12-15 NOTE — Anesthesia Procedure Notes (Signed)
 Procedure Name: MAC Date/Time: 12/15/2023 7:35 AM  Performed by: Cheral Bay, CRNAPre-anesthesia Checklist: Patient identified, Emergency Drugs available, Suction available, Patient being monitored and Timeout performed Patient Re-evaluated:Patient Re-evaluated prior to induction Oxygen Delivery Method: Nasal cannula Induction Type: IV induction Placement Confirmation: positive ETCO2 and CO2 detector
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
## 2023-12-15 NOTE — Anesthesia Procedure Notes (Signed)
 Spinal  Patient location during procedure: OR Reason for block: surgical anesthesia Staffing Performed: anesthesiologist  Anesthesiologist: Corinda Gubler, MD Performed by: Corinda Gubler, MD Authorized by: Corinda Gubler, MD   Preanesthetic Checklist Completed: patient identified, IV checked, site marked, risks and benefits discussed, surgical consent, monitors and equipment checked, pre-op evaluation and timeout performed Spinal Block Patient position: sitting Prep: ChloraPrep and site prepped and draped Patient monitoring: heart rate, continuous pulse ox, blood pressure and cardiac monitor Approach: midline Location: L3-4 Injection technique: single-shot Needle Needle type: Quincke  Needle gauge: 24 G Needle length: 9 cm Assessment Sensory level: T10 Events: CSF return Additional Notes Meticulous sterile technique used throughout (CHG prep, sterile gloves, sterile drape). Negative paresthesia. Negative blood return. Positive free-flowing CSF. Expiration date of kit checked and confirmed. Patient tolerated procedure well, without complications.

## 2023-12-15 NOTE — Op Note (Signed)
 OPERATIVE NOTE  DATE OF SURGERY:  12/15/2023  PATIENT NAME:  Bianca Fry   DOB: July 21, 1946  MRN: [id number]  PRE-OPERATIVE DIAGNOSIS: Degenerative arthrosis of the right hip, primary  POST-OPERATIVE DIAGNOSIS:  Same  PROCEDURE:  Right total hip arthroplasty  SURGEON:  Jena Gauss. M.D.  ASSISTANT:  Gean Birchwood, PA-C (present and scrubbed throughout the case, critical for assistance with exposure, retraction, instrumentation, and closure)  ANESTHESIA: spinal  ESTIMATED BLOOD LOSS: 100 mL  FLUIDS REPLACED: 1000 mL of crystalloid  DRAINS: 2 medium Hemovac drains  IMPLANTS UTILIZED: DePuy size 4 standard offset Actis femoral stem, 52 mm OD Pinnacle 100 acetabular component, +4 mm 10 degree Pinnacle Marathon polyethylene insert, and a 36 mm M-SPEC +1.5 mm hip ball  INDICATIONS FOR SURGERY: Bianca Fry is a 78 y.o. year old female with a long history of progressive hip and groin  pain. X-rays demonstrated severe degenerative changes. The patient had not seen any significant improvement despite conservative nonsurgical intervention. After discussion of the risks and benefits of surgical intervention, the patient expressed understanding of the risks benefits and agree with plans for total hip arthroplasty.   The risks, benefits, and alternatives were discussed at length including but not limited to the risks of infection, bleeding, nerve injury, stiffness, blood clots, the need for revision surgery, limb length inequality, dislocation, cardiopulmonary complications, among others, and they were willing to proceed.  PROCEDURE IN DETAIL: The patient was brought into the operating room and, after adequate spinal anesthesia was achieved, the patient was placed in a left lateral decubitus position. Axillary roll was placed and all bony prominences were well-padded. The patient's right hip was cleaned and prepped with alcohol and DuraPrep and draped in the usual sterile fashion. A "timeout"  was performed as per usual protocol. A lateral curvilinear incision was made gently curving towards the posterior superior iliac spine. The IT band was incised in line with the skin incision and the fibers of the gluteus maximus were split in line. The piriformis tendon was identified, skeletonized, and incised at its insertion to the proximal femur and reflected posteriorly. A T type posterior capsulotomy was performed. Prior to dislocation of the femoral head, a threaded Steinmann pin was inserted through a separate stab incision into the pelvis superior to the acetabulum and bent in the form of a stylus so as to assess limb length and hip offset throughout the procedure. The femoral head was then dislocated posteriorly. Inspection of the femoral head demonstrated severe degenerative changes with full-thickness loss of articular cartilage. The femoral neck cut was performed using an oscillating saw. The anterior capsule was elevated off of the femoral neck using a periosteal elevator. Attention was then directed to the acetabulum. The remnant of the labrum was excised using electrocautery. Inspection of the acetabulum also demonstrated significant degenerative changes. The acetabulum was reamed in sequential fashion up to a 51 mm diameter. Good punctate bleeding bone was encountered. A 52 mm Pinnacle 100 acetabular component was positioned and impacted into place. Good scratch fit was appreciated. A +4 mm neutral polyethylene trial was inserted.  Attention was then directed to the proximal femur.  Femoral broaches were inserted in a sequential fashion up to a size 4 broach. Calcar region was planed and a trial reduction was performed using a standard offset neck and a 36 mm hip ball with a +1.5 mm neck length.  Reasonably good stability was noted but it was elected to trial with a +4 mm 10  degree liner with the high side at the 8 o'clock position.  Good equalization of limb lengths and hip offset was appreciated  and excellent stability was noted both anteriorly and posteriorly. Trial components were removed. The acetabular shell was irrigated with copious amounts of normal saline with antibiotic solution and suctioned dry. A +4 mm 10 degree Pinnacle Marathon polyethylene insert was positioned with the high side at the 8 o'clock position and impacted into place. Next, a size 4 standard offset Actis femoral stem was positioned and impacted into place. Excellent scratch fit was appreciated. A trial reduction was again performed with a 36 mm hip ball with a +1.5 mm neck length. Again, good equalization of limb lengths was appreciated and excellent stability appreciated both anteriorly and posteriorly. The hip was then dislocated and the trial hip ball was removed. The Morse taper was cleaned and dried. A 36 mm M-SPEC hip ball with a +1.5 mm neck length was placed on the trunnion and impacted into place. The hip was then reduced and placed through range of motion. Excellent stability was appreciated both anteriorly and posteriorly.  The wound was irrigated with copious amounts of normal saline followed by 450 ml of Surgiphor and suctioned dry. Good hemostasis was appreciated. The posterior capsulotomy was repaired using #5 Ethibond. Piriformis tendon was reapproximated to the undersurface of the gluteus medius tendon using #5 Ethibond. The IT band was reapproximated using interrupted sutures of #1 Vicryl. Subcutaneous tissue was approximated using first #0 Vicryl followed by #2-0 Vicryl. The skin was closed with skin staples.  The patient tolerated the procedure well and was transported to the recovery room in stable condition.   Jena Gauss., M.D.
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
## 2023-12-15 NOTE — Progress Notes (Signed)
 Patient is not able to walk the distance required to go the bathroom, or he/she is unable to safely negotiate stairs required to access the bathroom.  A 3in1 BSC will alleviate this problem   Amenda Duclos P. Angie Fava M.D.

## 2023-12-15 NOTE — Transfer of Care (Signed)
 Immediate Anesthesia Transfer of Care Note  Patient: Bianca Fry  Procedure(s) Performed: TOTAL HIP ARTHROPLASTY (Right: Hip)  Patient Location: PACU  Anesthesia Type:Spinal  Level of Consciousness: sedated  Airway & Oxygen Therapy: Patient Spontanous Breathing and Patient connected to nasal cannula oxygen  Post-op Assessment: Report given to RN and Post -op Vital signs reviewed and stable  Post vital signs: Reviewed and stable  Last Vitals:  Vitals Value Taken Time  BP 125/44 12/15/23 1033  Temp    Pulse 65 12/15/23 1035  Resp 11 12/15/23 1035  SpO2 100 % 12/15/23 1035  Vitals shown include unfiled device data.  Last Pain: There were no vitals filed for this visit.       Complications: No notable events documented.

## 2023-12-16 DIAGNOSIS — Z96641 Presence of right artificial hip joint: Secondary | ICD-10-CM | POA: Diagnosis not present

## 2023-12-16 DIAGNOSIS — M1611 Unilateral primary osteoarthritis, right hip: Secondary | ICD-10-CM | POA: Diagnosis not present

## 2023-12-16 DIAGNOSIS — R29898 Other symptoms and signs involving the musculoskeletal system: Secondary | ICD-10-CM | POA: Diagnosis not present

## 2023-12-16 MED ORDER — CELECOXIB 200 MG PO CAPS: 200.0000 mg | ORAL_CAPSULE | Freq: Two times a day (BID) | ORAL | 1 refills | Status: DC

## 2023-12-16 MED ORDER — TRAMADOL HCL 50 MG PO TABS
50.0000 mg | ORAL_TABLET | ORAL | 0 refills | Status: DC | PRN
Start: 2023-12-16 — End: 2024-02-08

## 2023-12-16 MED ORDER — OXYCODONE HCL 5 MG PO TABS: 5.0000 mg | ORAL_TABLET | ORAL | 0 refills | Status: DC | PRN

## 2023-12-16 MED ORDER — ASPIRIN 81 MG PO TBEC: 81.0000 mg | DELAYED_RELEASE_TABLET | Freq: Two times a day (BID) | ORAL | Status: DC

## 2023-12-16 NOTE — Progress Notes (Signed)
 Discharge Summary for Bianca Fry  Discharge Plan: Patient will be discharged home as per the MD's order. We discussed prescriptions and follow-up appointments with the patient. The prescriptions were provided, and the medication list was explained in detail. Patient confirmed understanding of the instructions.  Skin Assessment: The patient's skin is clean, dry, and intact, with no signs of breakdown or tears. The IV catheter was removed, and the skin remains intact. The site shows no signs or symptoms of complications. A dressing and pressure were applied to the site. Patient reports no pain and has no complaints.  After-Visit Summary: An After-Visit Summary was printed and given to the patient. The following items were sent home with patient:  3-in-1 bedside commode Front wheel rolling walker Polar care Two honeycomb bandages Two TED hose placed on both patient legs  The patient was escorted via wheelchair and discharged home in a private vehicle.  Kaysin Brock D. Lawerance Bach, RN

## 2023-12-16 NOTE — Evaluation (Signed)
 Occupational Therapy Evaluation Patient Details Name: Bianca Fry MRN: [id number] DOB: 01/29/1946 Today's Date: 12/16/2023   History of Present Illness   Pt is a 78 yo female s/p R THA. PMH of smoking, HTN, CAD, PVD, anxiety, hiatal hernia, HLD.     Clinical Impressions Pt seen for OT evaluation this date, POD#1 from above surgery. Pt was independent in all ADLs prior to surgery, reported no DME use for mobility PTA. Pt is eager to return to PLOF with less pain and improved safety and independence. On arrival to room pt was with tech with pt using BSC located in the bathroom. Tech stated pt amb with RW to BR but was fearful of having an accident, pt used BSC due to it being closer to door. Pt completed standing pericare, noted unstableness on feet during dynamic standing, able to self correct, reliant on RW for balance. Pt currently requires MOD assist for LB dressing and while in seated position due to pain and limited AROM of R hip, pt used a reacher to attempt task. Pt stated husband is very helpful and is willing and able to assist her in all ADL/IADL tasks. Pt instructed in self care skills, falls prevention strategies, home/routines modifications, DME/AE for LB dressing tasks, and compression stocking mgt strategies. Pt would benefit from additional instruction in self care skills and techniques to help maintain precautions with or without assistive devices to support recall and carryover prior to discharge. OT will follow acutely.      If plan is discharge home, recommend the following:   A lot of help with bathing/dressing/bathroom;A little help with walking and/or transfers     Functional Status Assessment   Patient has had a recent decline in their functional status and demonstrates the ability to make significant improvements in function in a reasonable and predictable amount of time.     Equipment Recommendations   BSC/3in1     Recommendations for Other Services          Precautions/Restrictions   Precautions Precautions: Fall;Posterior Hip Precaution Booklet Issued: Yes (comment) Recall of Precautions/Restrictions: Intact Restrictions Weight Bearing Restrictions Per Provider Order: Yes     Mobility Bed Mobility Overal bed mobility: Needs Assistance Bed Mobility: Sit to Supine       Sit to supine: Modified independent (Device/Increase time)   General bed mobility comments: No physical assist required, effortful throughout    Transfers Overall transfer level: Needs assistance Equipment used: Rolling walker (2 wheels) Transfers: Sit to/from Stand Sit to Stand: Contact guard assist           General transfer comment: Pt demonstrated good adherence to precautions throughout transfers, taking her time - using self talk to recall sequencing when amb with RW      Balance Overall balance assessment: Needs assistance Sitting-balance support: Feet supported Sitting balance-Leahy Scale: Good Sitting balance - Comments: Dressing on EOB   Standing balance support: Reliant on assistive device for balance, During functional activity, No upper extremity supported, Bilateral upper extremity supported Standing balance-Leahy Scale: Fair Standing balance comment: Pt is reliant on RW for dynamic standing, demonstrated 1 loss of balance but able to self correct easily                           ADL either performed or assessed with clinical judgement   ADL Overall ADL's : Needs assistance/impaired     Grooming: Wash/dry hands;Brushing hair;Contact guard assist;Standing Grooming Details (indicate cue type  and reason): Sink level grooming tasks with CGA + RW             Lower Body Dressing: Moderate assistance;Adhering to hip precautions;Sit to/from stand;With adaptive equipment Lower Body Dressing Details (indicate cue type and reason): Pt required assistance for inital use of reacher on donn LB dressing on the affected  extermity Toilet Transfer: Contact guard assist;Rolling walker (2 wheels);Ambulation;BSC/3in1;Grab bars Toilet Transfer Details (indicate cue type and reason): Pt on BSC in bathroom with tech present on arrival to pt room Toileting- Clothing Manipulation and Hygiene: Contact guard assist;Sit to/from stand;Adhering to hip precautions       Functional mobility during ADLs: Rolling walker (2 wheels);Contact guard assist;Cueing for safety General ADL Comments: Pt required MIN A with use of reacher to donn LB clothing on her affected extermity, MODI for UB dressing in sitting     Vision Baseline Vision/History: 1 Wears glasses       Perception         Praxis         Pertinent Vitals/Pain Pain Assessment Pain Assessment: 0-10 Pain Score: 7  Pain Location: R hip Pain Descriptors / Indicators: Discomfort Pain Intervention(s): Monitored during session, Limited activity within patient's tolerance, Repositioned, Ice applied     Extremity/Trunk Assessment Upper Extremity Assessment Upper Extremity Assessment: Overall WFL for tasks assessed   Lower Extremity Assessment Lower Extremity Assessment: Generalized weakness;Overall WFL for tasks assessed       Communication Communication Communication: No apparent difficulties   Cognition Arousal: Alert Behavior During Therapy: WFL for tasks assessed/performed Cognition: No apparent impairments             OT - Cognition Comments: AO x4 throughout session                 Following commands: Intact       Cueing  General Comments   Cueing Techniques: Verbal cues;Tactile cues      Exercises Other Exercises Other Exercises: Edu: LB dressing techniques with use of AD, handout provided, review of post hip precautions   Shoulder Instructions      Home Living Family/patient expects to be discharged to:: Private residence Living Arrangements: Spouse/significant other Available Help at Discharge: Family Type of  Home: House Home Access: Stairs to enter Secretary/administrator of Steps: 2 Entrance Stairs-Rails: Right;Left;Can reach both Home Layout: One level     Bathroom Shower/Tub: Producer, television/film/video: Handicapped height     Home Equipment: Agricultural consultant (2 wheels);BSC/3in1;Adaptive equipment;Shower seat - built in;Grab bars - tub/shower;Toilet riser Adaptive Equipment: Reacher        Prior Functioning/Environment Prior Level of Function : Independent/Modified Independent             Mobility Comments: Pt reports currently driving, walking with no DME PTA      OT Problem List: Decreased strength;Decreased activity tolerance;Decreased safety awareness;Impaired balance (sitting and/or standing)   OT Treatment/Interventions: Self-care/ADL training;Therapeutic activities;Energy conservation;DME and/or AE instruction;Balance training;Patient/family education      OT Goals(Current goals can be found in the care plan section)   Acute Rehab OT Goals Patient Stated Goal: To return home OT Goal Formulation: With patient Time For Goal Achievement: 12/30/23 Potential to Achieve Goals: Good ADL Goals Pt Will Perform Grooming: with modified independence;standing Pt Will Perform Lower Body Dressing: with adaptive equipment;with modified independence;sit to/from stand Pt Will Transfer to Toilet: with modified independence;regular height toilet Pt Will Perform Toileting - Clothing Manipulation and hygiene: with modified independence;sit to/from stand  OT Frequency:  Min 1X/week    Co-evaluation              AM-PAC OT "6 Clicks" Daily Activity     Outcome Measure Help from another person eating meals?: None Help from another person taking care of personal grooming?: A Little Help from another person toileting, which includes using toliet, bedpan, or urinal?: A Little Help from another person bathing (including washing, rinsing, drying)?: A Lot Help from another  person to put on and taking off regular upper body clothing?: None Help from another person to put on and taking off regular lower body clothing?: A Lot 6 Click Score: 18   End of Session Equipment Utilized During Treatment: Rolling walker (2 wheels) Nurse Communication: Mobility status  Activity Tolerance: Patient tolerated treatment well Patient left: in bed;with call bell/phone within reach;with family/visitor present  OT Visit Diagnosis: Unsteadiness on feet (R26.81);Other abnormalities of gait and mobility (R26.89);Muscle weakness (generalized) (M62.81)                Time: 1610-9604 OT Time Calculation (min): 25 min Charges:  OT General Charges $OT Visit: 1 Visit OT Evaluation $OT Eval Low Complexity: 1 Low OT Treatments $Self Care/Home Management : 8-22 mins  Glenard Haring M.S. OTR/L  12/16/23, 10:24 AM

## 2023-12-16 NOTE — Progress Notes (Signed)
 Physical Therapy Treatment Patient Details Name: Bianca Fry MRN: [id number] DOB: Apr 18, 1946 Today's Date: 12/16/2023   History of Present Illness Pt is a 78 yo female s/p R THA. PMH of smoking, HTN, CAD, PVD, anxiety, hiatal hernia, HLD.    PT Comments  Pt was long sitting in bed upon arrival. She is alert and oriented but does present with flat affect. She is very pleasant and cooperative throughout. Reviewed precautions and ways to adhere to precautions. Pt states understanding. She was safely able to exit bed, stand to RW, and tolerate ambulation > 200 ft. Pt started ambulation with antalgic step to (3 point gait) that improved to step through pattern. Vcs during ambulation for posture correction. She was able to tolerate and demonstrate safe stairs performance to simulate home entry. Pt performed ascending/descending 4 stairs with BUE support + step to pattern. Pt states confidence in her abilities to safely ascend/descend 4 stairs. Author reviewed car transfers, HEP, expectations of HHPT, and importance of routine ambulation. Pt endorses feeling safe to DC home today with spouse assisting at home. DC recs remain appropriate. Pt will need BSC prior to going home.    If plan is discharge home, recommend the following: A little help with walking and/or transfers;A little help with bathing/dressing/bathroom;Assistance with cooking/housework;Assist for transportation;Help with stairs or ramp for entrance     Equipment Recommendations  BSC/3in1 (pt endorses having RW already but need for [facility].)       Precautions / Restrictions Precautions Precautions: Fall;Posterior Hip Precaution Booklet Issued: Yes (comment) Recall of Precautions/Restrictions: Intact Restrictions Weight Bearing Restrictions Per Provider Order: Yes RLE Weight Bearing Per Provider Order: Weight bearing as tolerated     Mobility  Bed Mobility Overal bed mobility: Needs Assistance Bed Mobility: Supine to Sit  Supine to  sit: Supervision  General bed mobility comments: vcs for progression but pt was able to perform. She endorses planning to sleep in recliner for first few days    Transfers Overall transfer level: Needs assistance Equipment used: Rolling walker (2 wheels) Transfers: Sit to/from Stand Sit to Stand: Contact guard assist  General transfer comment: CGA for safety to stand from EOB. vcs for handplacement and technique with adhereing to precautions    Ambulation/Gait Ambulation/Gait assistance: Contact guard assist, Supervision Gait Distance (Feet): 200 Feet Assistive device: Rolling walker (2 wheels) Gait Pattern/deviations: Step-to pattern, Step-through pattern, Antalgic, Trunk flexed Gait velocity: decreased  General Gait Details: Pt was able to ambulate > 200 ft with RW. started with antalgic step to gait pattern that progressed to step through. Encouraged posture correction and heel strike to toe off. Improved gait throughout gait training.   Stairs Stairs: Yes Stairs assistance: Contact guard assist Stair Management: Two rails, Step to pattern, Forwards Number of Stairs: 4 General stair comments: Pt was able to ambulate up/down stairs with step to pattern + BUE support. Vcs for correct sequencing. Did write down on HEP correct sequencing. Pt states confidenece in abilities to ascend/descend stairs to safely enter/exit home.    Balance Overall balance assessment: Needs assistance Sitting-balance support: Feet supported Sitting balance-Leahy Scale: Good     Standing balance support: Bilateral upper extremity supported, Reliant on assistive device for balance, During functional activity Standing balance-Leahy Scale: Good Standing balance comment: pt is reliant on RW for dynamic standing activity      Communication Communication Communication: No apparent difficulties  Cognition Arousal: Alert Behavior During Therapy: WFL for tasks assessed/performed   PT - Cognitive  impairments: No apparent impairments  PT - Cognition Comments: Pt is A and O x 4. Flat affect but cooperative and pleasant Following commands: Intact      Cueing Cueing Techniques: Verbal cues, Tactile cues     General Comments General comments (skin integrity, edema, etc.): reviewed car transfers, stairs, importance of routine mobility and what to expect form HHPT. Pt states understanding and feels safe to DC home later this date.      Pertinent Vitals/Pain Pain Assessment Pain Assessment: 0-10 Pain Score: 5  Pain Location: R hip Pain Descriptors / Indicators: Discomfort Pain Intervention(s): Limited activity within patient's tolerance, Monitored during session, Premedicated before session, Ice applied, Patient requesting pain meds-RN notified     PT Goals (current goals can now be found in the care plan section) Acute Rehab PT Goals Patient Stated Goal: to go home Progress towards PT goals: Progressing toward goals    Frequency    BID       AM-PAC PT "6 Clicks" Mobility   Outcome Measure  Help needed turning from your back to your side while in a flat bed without using bedrails?: A Little Help needed moving from lying on your back to sitting on the side of a flat bed without using bedrails?: A Little Help needed moving to and from a bed to a chair (including a wheelchair)?: A Little Help needed standing up from a chair using your arms (e.g., wheelchair or bedside chair)?: A Little Help needed to walk in hospital room?: A Little Help needed climbing 3-5 steps with a railing? : A Little 6 Click Score: 18    End of Session   Activity Tolerance: Patient tolerated treatment well Patient left: in chair;with call bell/phone within reach;with chair alarm set;with nursing/sitter in room (ortho PA in room to remove drain) Nurse Communication: Mobility status PT Visit Diagnosis: Other abnormalities of gait and mobility (R26.89);Difficulty in walking, not elsewhere  classified (R26.2);Muscle weakness (generalized) (M62.81);Pain Pain - Right/Left: Right Pain - part of body: Hip     Time: 9147-8295 PT Time Calculation (min) (ACUTE ONLY): 24 min  Charges:    $Gait Training: 8-22 mins $Therapeutic Activity: 8-22 mins PT General Charges $$ ACUTE PT VISIT: 1 Visit                     Jetta Lout PTA 12/16/23, 8:27 AM

## 2023-12-16 NOTE — Anesthesia Postprocedure Evaluation (Signed)
 Anesthesia Post Note  Patient: Bianca Fry  Procedure(s) Performed: TOTAL HIP ARTHROPLASTY (Right: Hip)  Patient location during evaluation: Nursing Unit Anesthesia Type: Spinal Level of consciousness: awake and alert and oriented Pain management: pain level controlled Vital Signs Assessment: post-procedure vital signs reviewed and stable Respiratory status: respiratory function stable Cardiovascular status: stable Postop Assessment: no headache, patient able to bend at knees, adequate PO intake, able to ambulate, no backache and no apparent nausea or vomiting Anesthetic complications: no   No notable events documented.   Last Vitals:  Vitals:   12/16/23 0343 12/16/23 0730  BP: (!) 153/57 (!) 140/56  Pulse: 68 65  Resp: 15 18  Temp: (!) 36.3 C 36.4 C  SpO2: 97% 96%    Last Pain:  Vitals:   12/16/23 0809  TempSrc:   PainSc: 5                  Jules Schick

## 2023-12-16 NOTE — Plan of Care (Signed)
  Problem: Activity: Goal: Ability to avoid complications of mobility impairment will improve Outcome: Progressing Goal: Ability to tolerate increased activity will improve Outcome: Progressing   Problem: Clinical Measurements: Goal: Postoperative complications will be avoided or minimized Outcome: Progressing   Problem: Pain Management: Goal: Pain level will decrease with appropriate interventions Outcome: Progressing   Problem: Skin Integrity: Goal: Will show signs of wound healing Outcome: Progressing   Problem: Nutrition: Goal: Adequate nutrition will be maintained Outcome: Progressing   Problem: Safety: Goal: Ability to remain free from injury will improve Outcome: Progressing

## 2023-12-16 NOTE — Plan of Care (Signed)
  Problem: Activity: Goal: Ability to tolerate increased activity will improve Outcome: Progressing   Problem: Pain Management: Goal: Pain level will decrease with appropriate interventions Outcome: Progressing   Problem: Coping: Goal: Level of anxiety will decrease Outcome: Progressing   Problem: Elimination: Goal: Will not experience complications related to urinary retention Outcome: Progressing

## 2023-12-16 NOTE — TOC Initial Note (Signed)
 Transition of Care [facility]) - Initial/Assessment Note    Patient Details  Name: Bianca Fry MRN: [id number] Date of Birth: 12/28/1945  [facility] CM/SW Contact:    Marlowe Sax, RN Phone Number: 12/16/2023, 8:52 AM  Clinical Narrative:                                                     [facility] Is set up for [facility] prior to surgery by Surgeons office  Adapt to deliver a 3 in 1 to the bedside       Patient Goals and CMS Choice            Expected Discharge Plan and Services         Expected Discharge Date: 12/16/23                                    Prior Living Arrangements/Services                       Activities of Daily Living   ADL Screening (condition at time of admission) Independently performs ADLs?: Yes (appropriate for developmental age) Is the patient deaf or have difficulty hearing?: No Does the patient have difficulty seeing, even when wearing glasses/contacts?: No Does the patient have difficulty concentrating, remembering, or making decisions?: No  Permission Sought/Granted                  Emotional Assessment              Admission diagnosis:  Hx of total hip arthroplasty, right [Z96.641] Patient Active Problem List   Diagnosis Date Noted   Hx of total hip arthroplasty, right 12/15/2023   Occipital neuralgia of right side 11/19/2023   Acute colitis 11/07/2023   Acute cystitis 11/07/2023   Low serum vitamin B12 05/15/2023   Leg weakness, bilateral 03/31/2023   Chronic constipation 10/16/2022   Pain and swelling of left lower leg 09/24/2022   Paresthesia of left foot 05/23/2022   Thrombocytopenia (HCC) 05/23/2022   Anemia 05/23/2022   Osteoarthritis of hip 05/18/2021   Pedal edema 04/01/2021   Coccyx pain 01/08/2021   Cervicogenic headache 09/10/2020   PAD (peripheral artery disease) (HCC) 02/10/2020   Palpitations 01/29/2020   Anxiety 01/09/2020   Chronic left-sided thoracic back pain  01/09/2020   Diverticulosis 12/17/2019   Skin rash 07/12/2019   Left sided sciatica 05/11/2019   Facial neuralgia 07/29/2018   Osteopenia 07/01/2018   Stenosis of right subclavian artery (HCC) 06/11/2018   Atrophic vaginitis 05/12/2018   Female pelvic pain 05/12/2018   Irritable bowel syndrome (IBS) 04/22/2018   Vitamin D deficiency 10/15/2017   Left carotid artery stenosis 04/16/2017   Prediabetes 04/16/2017   Congenital ptosis of right eyelid 04/16/2017   Epidermal cyst 04/14/2017   Hemangioma 03/08/2017   Mixed incontinence 08/30/2016   Squamous cell carcinoma in situ 06/24/2016   Health maintenance examination 04/15/2016   CAD (coronary artery disease) 04/15/2016   Basal cell carcinoma 01/15/2016   Ephelides 12/25/2015   Seborrheic keratosis 12/25/2015   Verruca vulgaris 06/10/2015   Medicare annual wellness visit, subsequent 04/12/2015   Advanced care planning/counseling discussion 04/12/2015   Abdominal pain, chronic, left upper quadrant 01/16/2015   Lower  abdominal pain 12/27/2014   Melanocytic nevi, unspecified 11/19/2014   Multiple lentigines syndrome 11/19/2014   Chronic right hip pain 02/28/2014   Ischemic colitis (HCC) 02/28/2014   Recurrent UTI 11/27/2013   Chest pain 09/05/2012   Adjustment disorder with depressed mood 12/27/2009   HLD (hyperlipidemia) 12/03/2008   Iron deficiency anemia 12/03/2008   Allergic rhinitis 07/09/2008   Essential hypertension 11/04/2007   GERD 05/19/2007   PCP:  Eustaquio Boyden, MD Pharmacy:   [facility] [address] [facility] - [place], [place] - [address] AT [facility] [address] [place] [address] Phone: [phone] Fax: [phone]     Social Drivers of Health (SDOH) Social History: SDOH Screenings   Food Insecurity: No Food Insecurity (12/15/2023)  Housing: Low Risk  (12/15/2023)  Transportation Needs: No Transportation Needs (12/15/2023)  Utilities: Not At Risk (12/15/2023)  Alcohol Screen: Low  Risk  (05/31/2023)  Depression (PHQ2-9): Low Risk  (08/31/2023)  Financial Resource Strain: Low Risk  (05/31/2023)  Physical Activity: Inactive (05/31/2023)  Social Connections: Moderately Integrated (12/15/2023)  Stress: No Stress Concern Present (05/31/2023)  Tobacco Use: Low Risk  (12/15/2023)  Health Literacy: Adequate Health Literacy (05/31/2023)   SDOH Interventions:     Readmission Risk Interventions     No data to display

## 2023-12-17 ENCOUNTER — Encounter: Payer: Self-pay | Admitting: Orthopedic Surgery

## 2023-12-17 DIAGNOSIS — Z471 Aftercare following joint replacement surgery: Secondary | ICD-10-CM | POA: Diagnosis not present

## 2023-12-17 DIAGNOSIS — Z96641 Presence of right artificial hip joint: Secondary | ICD-10-CM | POA: Diagnosis not present

## 2023-12-17 DIAGNOSIS — M51369 Other intervertebral disc degeneration, lumbar region without mention of lumbar back pain or lower extremity pain: Secondary | ICD-10-CM | POA: Diagnosis not present

## 2023-12-17 DIAGNOSIS — D696 Thrombocytopenia, unspecified: Secondary | ICD-10-CM | POA: Diagnosis not present

## 2023-12-17 DIAGNOSIS — K449 Diaphragmatic hernia without obstruction or gangrene: Secondary | ICD-10-CM | POA: Diagnosis not present

## 2023-12-17 DIAGNOSIS — M19012 Primary osteoarthritis, left shoulder: Secondary | ICD-10-CM | POA: Diagnosis not present

## 2023-12-17 DIAGNOSIS — F4321 Adjustment disorder with depressed mood: Secondary | ICD-10-CM | POA: Diagnosis not present

## 2023-12-17 DIAGNOSIS — D509 Iron deficiency anemia, unspecified: Secondary | ICD-10-CM | POA: Diagnosis not present

## 2023-12-17 DIAGNOSIS — F419 Anxiety disorder, unspecified: Secondary | ICD-10-CM | POA: Diagnosis not present

## 2023-12-17 DIAGNOSIS — M19011 Primary osteoarthritis, right shoulder: Secondary | ICD-10-CM | POA: Diagnosis not present

## 2023-12-17 DIAGNOSIS — M8580 Other specified disorders of bone density and structure, unspecified site: Secondary | ICD-10-CM | POA: Diagnosis not present

## 2023-12-17 DIAGNOSIS — I2510 Atherosclerotic heart disease of native coronary artery without angina pectoris: Secondary | ICD-10-CM | POA: Diagnosis not present

## 2023-12-17 DIAGNOSIS — I6522 Occlusion and stenosis of left carotid artery: Secondary | ICD-10-CM | POA: Diagnosis not present

## 2023-12-17 DIAGNOSIS — K219 Gastro-esophageal reflux disease without esophagitis: Secondary | ICD-10-CM | POA: Diagnosis not present

## 2023-12-17 DIAGNOSIS — K5790 Diverticulosis of intestine, part unspecified, without perforation or abscess without bleeding: Secondary | ICD-10-CM | POA: Diagnosis not present

## 2023-12-17 DIAGNOSIS — E559 Vitamin D deficiency, unspecified: Secondary | ICD-10-CM | POA: Diagnosis not present

## 2023-12-17 DIAGNOSIS — N3946 Mixed incontinence: Secondary | ICD-10-CM | POA: Diagnosis not present

## 2023-12-17 DIAGNOSIS — G518 Other disorders of facial nerve: Secondary | ICD-10-CM | POA: Diagnosis not present

## 2023-12-17 DIAGNOSIS — K581 Irritable bowel syndrome with constipation: Secondary | ICD-10-CM | POA: Diagnosis not present

## 2023-12-17 DIAGNOSIS — E785 Hyperlipidemia, unspecified: Secondary | ICD-10-CM | POA: Diagnosis not present

## 2023-12-17 DIAGNOSIS — K760 Fatty (change of) liver, not elsewhere classified: Secondary | ICD-10-CM | POA: Diagnosis not present

## 2023-12-17 DIAGNOSIS — I700 Atherosclerosis of aorta: Secondary | ICD-10-CM | POA: Diagnosis not present

## 2023-12-17 DIAGNOSIS — I10 Essential (primary) hypertension: Secondary | ICD-10-CM | POA: Diagnosis not present

## 2023-12-17 DIAGNOSIS — I471 Supraventricular tachycardia, unspecified: Secondary | ICD-10-CM | POA: Diagnosis not present

## 2023-12-20 ENCOUNTER — Other Ambulatory Visit: Payer: PPO

## 2023-12-28 DIAGNOSIS — D696 Thrombocytopenia, unspecified: Secondary | ICD-10-CM | POA: Diagnosis not present

## 2023-12-28 DIAGNOSIS — I700 Atherosclerosis of aorta: Secondary | ICD-10-CM | POA: Diagnosis not present

## 2023-12-28 DIAGNOSIS — Z471 Aftercare following joint replacement surgery: Secondary | ICD-10-CM | POA: Diagnosis not present

## 2023-12-28 DIAGNOSIS — F419 Anxiety disorder, unspecified: Secondary | ICD-10-CM | POA: Diagnosis not present

## 2023-12-28 DIAGNOSIS — I471 Supraventricular tachycardia, unspecified: Secondary | ICD-10-CM | POA: Diagnosis not present

## 2023-12-28 DIAGNOSIS — F4321 Adjustment disorder with depressed mood: Secondary | ICD-10-CM | POA: Diagnosis not present

## 2023-12-28 DIAGNOSIS — M51369 Other intervertebral disc degeneration, lumbar region without mention of lumbar back pain or lower extremity pain: Secondary | ICD-10-CM | POA: Diagnosis not present

## 2024-01-02 ENCOUNTER — Other Ambulatory Visit: Payer: Self-pay | Admitting: Family Medicine

## 2024-01-02 DIAGNOSIS — F419 Anxiety disorder, unspecified: Secondary | ICD-10-CM

## 2024-01-02 DIAGNOSIS — R112 Nausea with vomiting, unspecified: Secondary | ICD-10-CM

## 2024-01-04 NOTE — Telephone Encounter (Signed)
 Zofran  Last rx:  12/10/23, #20  Last OV:  11/29/23, hosp f/u Next OV:  02/16/24, 3 mo f/u

## 2024-01-05 MED ORDER — ALPRAZOLAM 0.5 MG PO TABS: ORAL_TABLET | ORAL | 0 refills | Status: DC

## 2024-01-05 NOTE — Telephone Encounter (Signed)
 Xanax 0.5 mg   Last rx:  11/29/23 #40 Last OV:  11/29/23, hosp f/u Next OV:  02/16/24, 3 mo f/u

## 2024-01-05 NOTE — Telephone Encounter (Signed)
 ERx

## 2024-01-13 DIAGNOSIS — N3020 Other chronic cystitis without hematuria: Secondary | ICD-10-CM | POA: Diagnosis not present

## 2024-01-16 ENCOUNTER — Other Ambulatory Visit: Payer: Self-pay | Admitting: Cardiovascular Disease

## 2024-01-19 ENCOUNTER — Ambulatory Visit: Admitting: Family Medicine

## 2024-01-27 DIAGNOSIS — M545 Low back pain, unspecified: Secondary | ICD-10-CM | POA: Diagnosis not present

## 2024-01-27 DIAGNOSIS — Z96641 Presence of right artificial hip joint: Secondary | ICD-10-CM | POA: Diagnosis not present

## 2024-01-27 DIAGNOSIS — G8929 Other chronic pain: Secondary | ICD-10-CM | POA: Diagnosis not present

## 2024-01-27 DIAGNOSIS — M47816 Spondylosis without myelopathy or radiculopathy, lumbar region: Secondary | ICD-10-CM | POA: Diagnosis not present

## 2024-01-29 ENCOUNTER — Other Ambulatory Visit: Payer: Self-pay | Admitting: Family Medicine

## 2024-01-29 DIAGNOSIS — R112 Nausea with vomiting, unspecified: Secondary | ICD-10-CM

## 2024-01-31 NOTE — Telephone Encounter (Signed)
 Zofran Last filled:  01/05/24, #20 Last OV:  11/29/23, HFU Next OV:  02/08/24, ongoing nausea

## 2024-02-08 ENCOUNTER — Ambulatory Visit (INDEPENDENT_AMBULATORY_CARE_PROVIDER_SITE_OTHER): Admitting: Family Medicine

## 2024-02-08 ENCOUNTER — Encounter: Payer: Self-pay | Admitting: Family Medicine

## 2024-02-08 VITALS — BP 128/66 | HR 76 | Temp 98.0°F | Ht 63.0 in | Wt 136.5 lb

## 2024-02-08 DIAGNOSIS — K5909 Other constipation: Secondary | ICD-10-CM

## 2024-02-08 DIAGNOSIS — R1032 Left lower quadrant pain: Secondary | ICD-10-CM

## 2024-02-08 DIAGNOSIS — M533 Sacrococcygeal disorders, not elsewhere classified: Secondary | ICD-10-CM

## 2024-02-08 DIAGNOSIS — R110 Nausea: Secondary | ICD-10-CM

## 2024-02-08 DIAGNOSIS — M25551 Pain in right hip: Secondary | ICD-10-CM | POA: Diagnosis not present

## 2024-02-08 DIAGNOSIS — D509 Iron deficiency anemia, unspecified: Secondary | ICD-10-CM | POA: Diagnosis not present

## 2024-02-08 DIAGNOSIS — R109 Unspecified abdominal pain: Secondary | ICD-10-CM | POA: Diagnosis not present

## 2024-02-08 DIAGNOSIS — R5383 Other fatigue: Secondary | ICD-10-CM | POA: Diagnosis not present

## 2024-02-08 DIAGNOSIS — Z96641 Presence of right artificial hip joint: Secondary | ICD-10-CM | POA: Diagnosis not present

## 2024-02-08 DIAGNOSIS — G8929 Other chronic pain: Secondary | ICD-10-CM

## 2024-02-08 LAB — POC URINALSYSI DIPSTICK (AUTOMATED)
Bilirubin, UA: NEGATIVE
Blood, UA: NEGATIVE
Glucose, UA: NEGATIVE
Ketones, UA: NEGATIVE
Nitrite, UA: NEGATIVE
Protein, UA: NEGATIVE
Spec Grav, UA: 1.015 (ref 1.010–1.025)
Urobilinogen, UA: 0.2 U/dL
pH, UA: 6 (ref 5.0–8.0)

## 2024-02-08 NOTE — Progress Notes (Signed)
 Ph: [phone] Fax: [phone]   Patient ID: Bianca Fry, female    DOB: July 06, 1946, 78 y.o.   MRN: [id number]  This visit was conducted in person.  BP 128/66   Pulse 76   Temp 98 F (36.7 C) (Oral)   Ht 5\' 3"  (1.6 m)   Wt 136 lb 8 oz (61.9 kg)   LMP 10/20/1979 (Within Years)   SpO2 99%   BMI 24.18 kg/m    CC: ongoing abd pain and nausea  Subjective:   HPI: Bianca Fry is a 78 y.o. female presenting on 02/08/2024 for Abdominal Pain (C/o ongoing LLQ abd pain, tailbone pain, constipation and nausea. Sxs started about 1 mo ago since having R hip pain. )   See prior notes for details.   S/p R hip replacement 11/2023 ([facility]). Surgery was postponed from 10/2023 due to COVID. R hip continues slowly healing.  Stayed nauseated after hip replacement for several months. Tramadol  for pain caused nausea so she stopped this.  Staying constipated - managing with sennakot and miralax .  Fatigued for months, acutely worse after surgery. Notes ongoing daytime somnolence.  LLQ abd pain actually improved over last few days  Coccydynia exacerbation for 2 weeks that improved with donut cushion use, as well as aquaphor topically. Has seen GYN (Admunson-Silva) and Derm (Amy [place]).   Last UTI treated by urology Dulcy Gibney 12/2023 (keflex  500mg  course). She is now on daily preventative 250mg  dose. Did not tolerate methenamine. Currently without significant UTI symptoms.   H/o diverticulitis and ischemic colitis and IBS alternating D/C      Relevant past medical, surgical, family and social history reviewed and updated as indicated. Interim medical history since our last visit reviewed. Allergies and medications reviewed and updated. Outpatient Medications Prior to Visit  Medication Sig Dispense Refill   acidophilus (RISAQUAD) CAPS capsule Take 1 capsule by mouth daily.     ALPRAZolam  (XANAX ) 0.5 MG tablet TAKE ONE TABLET BY MOUTH TWICE A DAY AS NEEDED FOR ANXIETY 40 tablet 0   aspirin  EC  81 MG tablet Take 1 tablet (81 mg total) by mouth in the morning and at bedtime. Swallow whole.     atorvastatin  (LIPITOR) 10 MG tablet TAKE ONE TABLET BY MOUTH ONE TIME DAILY 90 tablet 3   Calcium  Carb-Cholecalciferol  (CALCIUM -VITAMIN D ) 600-400 MG-UNIT TABS Take 1 tablet by mouth daily.     carvedilol  (COREG ) 6.25 MG tablet TAKE ONE TABLET BY MOUTH TWICE A DAY WITH FOOD 180 tablet 3   cephALEXin  (KEFLEX ) 250 MG capsule Take 250 mg by mouth daily.     cholecalciferol  (VITAMIN D ) 25 MCG (1000 UNIT) tablet Take 1,000 Units by mouth daily.     Cyanocobalamin  (B-12) 1000 MCG SUBL Place 1 tablet under the tongue every Monday, Wednesday, and Friday.     dicyclomine  (BENTYL ) 10 MG capsule Take 1 capsule (10 mg total) by mouth 2 (two) times daily as needed for spasms. 90 capsule 1   esomeprazole  (NEXIUM ) 40 MG capsule Take 1 capsule (40 mg total) by mouth daily.     estradiol  (ESTRACE ) 0.1 MG/GM vaginal cream Use 1/2 g vaginally two or three times per week as needed to maintain symptom relief. 42.5 g 2   fexofenadine (ALLEGRA) 180 MG tablet Take 180 mg by mouth daily as needed for allergies.     gabapentin  (NEURONTIN ) 300 MG capsule Take 1 capsule (300 mg total) by mouth at bedtime. 90 capsule 1   losartan  (COZAAR ) 50 MG tablet Take  1.5 tablets (75 mg total) by mouth daily. 135 tablet 4   ondansetron  (ZOFRAN ) 4 MG tablet TAKE ONE TABLET BY MOUTH EVERY 8 HOURS AS NEEDED FOR NAUSEA AND VOMITING 20 tablet 0   polyethylene glycol powder (GLYCOLAX /MIRALAX ) 17 GM/SCOOP powder Take 17 g by mouth 2 (two) times daily as needed for moderate constipation (hold for diarrhea).     sucralfate  (CARAFATE ) 1 g tablet Take 1 tablet (1 g total) by mouth 3 (three) times daily with meals as needed (reflux). 60 tablet 0   valACYclovir  (VALTREX ) 500 MG tablet Take one tablet (500 mg) by mouth twice a day for 3 days as needed for a genital outbreak.  Take 4 tablets (2000 mg) by mouth twice a day for 1 day as needed for an oral  outbreak. 30 tablet 1   celecoxib  (CELEBREX ) 200 MG capsule Take 1 capsule (200 mg total) by mouth 2 (two) times daily. 60 capsule 1   oxyCODONE  (OXY IR/ROXICODONE ) 5 MG immediate release tablet Take 1 tablet (5 mg total) by mouth every 4 (four) hours as needed for moderate pain (pain score 4-6) (pain score 4-6). 30 tablet 0   traMADol  (ULTRAM ) 50 MG tablet Take 1-2 tablets (50-100 mg total) by mouth every 4 (four) hours as needed for moderate pain (pain score 4-6). 30 tablet 0   No facility-administered medications prior to visit.     Per HPI unless specifically indicated in ROS section below Review of Systems  Objective:  BP 128/66   Pulse 76   Temp 98 F (36.7 C) (Oral)   Ht 5\' 3"  (1.6 m)   Wt 136 lb 8 oz (61.9 kg)   LMP 10/20/1979 (Within Years)   SpO2 99%   BMI 24.18 kg/m   Wt Readings from Last 3 Encounters:  02/08/24 136 lb 8 oz (61.9 kg)  12/15/23 136 lb (61.7 kg)  12/02/23 138 lb (62.6 kg)      Physical Exam Vitals and nursing note reviewed.  Constitutional:      Appearance: Normal appearance. She is not ill-appearing.  HENT:     Head: Normocephalic and atraumatic.     Mouth/Throat:     Mouth: Mucous membranes are moist.     Pharynx: Oropharynx is clear. No oropharyngeal exudate or posterior oropharyngeal erythema.  Eyes:     Extraocular Movements: Extraocular movements intact.     Pupils: Pupils are equal, round, and reactive to light.  Cardiovascular:     Rate and Rhythm: Normal rate and regular rhythm.     Pulses: Normal pulses.     Heart sounds: Normal heart sounds. No murmur heard. Pulmonary:     Effort: Pulmonary effort is normal. No respiratory distress.     Breath sounds: Normal breath sounds. No wheezing, rhonchi or rales.  Abdominal:     General: Bowel sounds are normal. There is no distension.     Palpations: Abdomen is soft. There is no mass.     Tenderness: There is abdominal tenderness (mild-moderate) in the suprapubic area, left upper quadrant  and left lower quadrant. There is no right CVA tenderness, left CVA tenderness, guarding or rebound. Negative signs include Murphy's sign.     Hernia: No hernia is present.  Musculoskeletal:        General: Tenderness present.     Right lower leg: No edema.     Left lower leg: No edema.     Comments:  No midline lumbar spine tenderness Reproducible tenderness to palpation at mid coccyx  Skin:    General: Skin is warm and dry.     Findings: No rash.     Comments:  Small non-tender skin lump to midline sacrum No other significant skin changes to sacral region   Neurological:     Mental Status: She is alert.  Psychiatric:        Mood and Affect: Mood normal.        Behavior: Behavior normal.       Results for orders placed or performed in visit on 02/08/24  POCT Urinalysis Dipstick (Automated)   Collection Time: 02/08/24  4:10 PM  Result Value Ref Range   Color, UA yellow    Clarity, UA clear    Glucose, UA Negative Negative   Bilirubin, UA negative    Ketones, UA negative    Spec Grav, UA 1.015 1.010 - 1.025   Blood, UA negative    pH, UA 6.0 5.0 - 8.0   Protein, UA Negative Negative   Urobilinogen, UA 0.2 0.2 or 1.0 E.U./dL   Nitrite, UA negative    Leukocytes, UA Moderate (2+) (A) Negative    Assessment & Plan:   Problem List Items Addressed This Visit     Left sided abdominal pain   Relevant Orders   POCT Urinalysis Dipstick (Automated) (Completed)   Urine Culture   Abdominal pain, chronic, left upper quadrant   Coccyx pain   This is recurred.  No significant skin changes today.  Previous imaging reviewed - reassuringly normal pelvic CT 04/2023.  Anticipate more coccydynia - discussed supportive measures including donut cushion use.      Fatigue - Primary   Relevant Orders   Comprehensive metabolic panel with GFR   CBC with Differential/Platelet   TSH   IBC panel   Ferritin   Vitamin B12     No orders of the defined types were placed in this  encounter.   Orders Placed This Encounter  Procedures   Urine Culture   Comprehensive metabolic panel with GFR   CBC with Differential/Platelet   TSH   IBC panel   Ferritin   Vitamin B12   POCT Urinalysis Dipstick (Automated)    Patient Instructions  Increase water.  May use miralax  up to twice daily as needed for constipation, hold for diarrhea.  Labs today  Skin to tailbone looks overall ok  You are more painful at tailbone - possible exacerbation of tailbone pain. Continue cushioning as well as avoiding prolonged direct pressure to area. Let me know if not improving with this.   Follow up plan: No follow-ups on file.  Claire Crick, MD

## 2024-02-08 NOTE — Patient Instructions (Addendum)
 Increase water.  May use miralax  up to twice daily as needed for constipation, hold for diarrhea.  Labs today  Skin to tailbone looks overall ok  You are more painful at tailbone - possible exacerbation of tailbone pain. Continue cushioning as well as avoiding prolonged direct pressure to area. Let me know if not improving with this.

## 2024-02-09 ENCOUNTER — Encounter: Payer: Self-pay | Admitting: Family Medicine

## 2024-02-09 DIAGNOSIS — R5383 Other fatigue: Secondary | ICD-10-CM | POA: Insufficient documentation

## 2024-02-09 DIAGNOSIS — R1032 Left lower quadrant pain: Secondary | ICD-10-CM | POA: Diagnosis not present

## 2024-02-09 LAB — IBC PANEL
Iron: 25 ug/dL — ABNORMAL LOW (ref 42–145)
Saturation Ratios: 6.3 % — ABNORMAL LOW (ref 20.0–50.0)
TIBC: 394.8 ug/dL (ref 250.0–450.0)
Transferrin: 282 mg/dL (ref 212.0–360.0)

## 2024-02-09 LAB — CBC WITH DIFFERENTIAL/PLATELET
Basophils Absolute: 0.1 10*3/uL (ref 0.0–0.1)
Basophils Relative: 1.3 % (ref 0.0–3.0)
Eosinophils Absolute: 0.2 10*3/uL (ref 0.0–0.7)
Eosinophils Relative: 1.9 % (ref 0.0–5.0)
HCT: 34.4 % — ABNORMAL LOW (ref 36.0–46.0)
Hemoglobin: 10.9 g/dL — ABNORMAL LOW (ref 12.0–15.0)
Lymphocytes Relative: 39.9 % (ref 12.0–46.0)
Lymphs Abs: 3.9 10*3/uL (ref 0.7–4.0)
MCHC: 31.7 g/dL (ref 30.0–36.0)
MCV: 85.5 fl (ref 78.0–100.0)
Monocytes Absolute: 0.8 10*3/uL (ref 0.1–1.0)
Monocytes Relative: 8.2 % (ref 3.0–12.0)
Neutro Abs: 4.8 10*3/uL (ref 1.4–7.7)
Neutrophils Relative %: 48.7 % (ref 43.0–77.0)
Platelets: 179 10*3/uL (ref 150.0–400.0)
RBC: 4.02 Mil/uL (ref 3.87–5.11)
RDW: 14.6 % (ref 11.5–15.5)
WBC: 9.8 10*3/uL (ref 4.0–10.5)

## 2024-02-09 LAB — COMPREHENSIVE METABOLIC PANEL WITH GFR
ALT: 8 U/L (ref 0–35)
AST: 12 U/L (ref 0–37)
Albumin: 4 g/dL (ref 3.5–5.2)
Alkaline Phosphatase: 92 U/L (ref 39–117)
BUN: 17 mg/dL (ref 6–23)
CO2: 27 meq/L (ref 19–32)
Calcium: 10.4 mg/dL (ref 8.4–10.5)
Chloride: 103 meq/L (ref 96–112)
Creatinine, Ser: 0.89 mg/dL (ref 0.40–1.20)
GFR: 61.79 mL/min (ref >60.00)
Glucose, Bld: 99 mg/dL (ref 70–99)
Potassium: 4.2 meq/L (ref 3.5–5.1)
Sodium: 139 meq/L (ref 135–145)
Total Bilirubin: 0.3 mg/dL (ref 0.2–1.2)
Total Protein: 6.7 g/dL (ref 6.0–8.3)

## 2024-02-09 LAB — FERRITIN: Ferritin: 13.9 ng/mL (ref 10.0–291.0)

## 2024-02-09 LAB — TSH: TSH: 1.47 u[IU]/mL (ref 0.35–5.50)

## 2024-02-09 LAB — VITAMIN B12: Vitamin B-12: 472 pg/mL (ref 211–911)

## 2024-02-09 NOTE — Assessment & Plan Note (Signed)
 This is recurred.  No significant skin changes today.  Previous imaging reviewed - reassuringly normal pelvic CT 04/2023.  Anticipate more coccydynia - discussed supportive measures including donut cushion use.

## 2024-02-10 ENCOUNTER — Encounter: Payer: Self-pay | Admitting: Family Medicine

## 2024-02-10 ENCOUNTER — Telehealth: Payer: Self-pay | Admitting: Pharmacy Technician

## 2024-02-10 DIAGNOSIS — Z96641 Presence of right artificial hip joint: Secondary | ICD-10-CM | POA: Diagnosis not present

## 2024-02-10 DIAGNOSIS — M25551 Pain in right hip: Secondary | ICD-10-CM | POA: Diagnosis not present

## 2024-02-10 DIAGNOSIS — R110 Nausea: Secondary | ICD-10-CM | POA: Insufficient documentation

## 2024-02-10 LAB — URINE CULTURE
MICRO NUMBER:: 16365228
SPECIMEN QUALITY:: ADEQUATE

## 2024-02-10 NOTE — Assessment & Plan Note (Signed)
 Worsened. Discussed miralax  use.  Avoid oral iron  - see below re iron  infusion

## 2024-02-10 NOTE — Assessment & Plan Note (Addendum)
 Nonspecific abd pain with nausea and some constipation since hip replacement several months ago, with symptoms worsened by tramadol  (now off this). Abd pain somewhat improved in last 1-2 days.  H/o recurrent UTIs. Denies typical UTI symptoms of dysuria, urgency. H/o diverticulitis, ischemic colitis, IBS mixed.  Check labs today  UA today - mod LE - pending culture.

## 2024-02-10 NOTE — Assessment & Plan Note (Signed)
 Update levels.  Oral iron  intolerance.  She has not had iron  infusion previously. Will order to [facility].

## 2024-02-10 NOTE — Assessment & Plan Note (Deleted)
 Chronic issue of unclear cause.

## 2024-02-10 NOTE — Telephone Encounter (Signed)
 Auth Submission: NO AUTH NEEDED Site of care: Site of care: [facility] Payer: [employer] ADVT Medication & CPT/J Code(s) submitted: Feraheme (ferumoxytol) [id number] Route of submission (phone, fax, portal):  Phone # Fax # Auth type: Buy/Bill PB Units/visits requested: 2 DOSES Reference number:  Approval from: 02/10/24 to 06/11/24

## 2024-02-10 NOTE — Assessment & Plan Note (Signed)
 Chronic, possibly worse after COVID infection 10/2023 or after hip replacement 11/2023.  Update labs today including iron  levels, b12, thyroid  and CBC.

## 2024-02-14 DIAGNOSIS — Z96641 Presence of right artificial hip joint: Secondary | ICD-10-CM | POA: Diagnosis not present

## 2024-02-14 DIAGNOSIS — M25551 Pain in right hip: Secondary | ICD-10-CM | POA: Diagnosis not present

## 2024-02-16 ENCOUNTER — Encounter: Payer: Self-pay | Admitting: Family Medicine

## 2024-02-16 ENCOUNTER — Ambulatory Visit (INDEPENDENT_AMBULATORY_CARE_PROVIDER_SITE_OTHER): Payer: PPO | Admitting: Family Medicine

## 2024-02-16 VITALS — BP 122/60 | HR 63 | Temp 97.9°F | Ht 63.0 in | Wt 137.0 lb

## 2024-02-16 DIAGNOSIS — D509 Iron deficiency anemia, unspecified: Secondary | ICD-10-CM | POA: Diagnosis not present

## 2024-02-16 DIAGNOSIS — M533 Sacrococcygeal disorders, not elsewhere classified: Secondary | ICD-10-CM

## 2024-02-16 DIAGNOSIS — R682 Dry mouth, unspecified: Secondary | ICD-10-CM | POA: Insufficient documentation

## 2024-02-16 DIAGNOSIS — K5909 Other constipation: Secondary | ICD-10-CM | POA: Diagnosis not present

## 2024-02-16 DIAGNOSIS — K582 Mixed irritable bowel syndrome: Secondary | ICD-10-CM

## 2024-02-16 MED ORDER — SENNOSIDES 8.6 MG PO TABS
1.0000 | ORAL_TABLET | Freq: Every day | ORAL | Status: DC
Start: 2024-02-16 — End: 2024-09-11

## 2024-02-16 NOTE — Assessment & Plan Note (Signed)
 Pending Feraheme infusions  Intolerant to oral iron .

## 2024-02-16 NOTE — Assessment & Plan Note (Signed)
 This is improved

## 2024-02-16 NOTE — Assessment & Plan Note (Signed)
 Continue miralax , sennakot daily. Continue good water and dietary fiber intake.

## 2024-02-16 NOTE — Assessment & Plan Note (Addendum)
 Continues PRN bentyl .  Could consider SNRI.

## 2024-02-16 NOTE — Progress Notes (Signed)
 Ph: [phone] Fax: [phone]   Patient ID: Bianca Fry, female    DOB: 1946/05/25, 78 y.o.   MRN: [id number]  This visit was conducted in person.  BP 122/60   Pulse 63   Temp 97.9 F (36.6 C) (Oral)   Ht 5\' 3"  (1.6 m)   Wt 137 lb (62.1 kg)   LMP 10/20/1979 (Within Years)   SpO2 99%   BMI 24.27 kg/m    CC: 3 mo f/u visit  Subjective:   HPI: Bianca Fry is a 78 y.o. female presenting on 02/16/2024 for Medical Management of Chronic Issues (Here for 3 mo f/u.)   See prior note for details. Nonspecific abdominal pain with nausea, worse constipation since hip replacement earlier in the year. Tramadol  may have worsened symptoms. Persistent fatigue.  Known h/o diverticulitis, ischemic colitis, mixed IBS.   Overall feeling better from GI standpoint.  She has been constipated - despite miralax  bid and sennakot daily with poor effect. Last night ate salad - and since then she developed significant bowel movements all this morning. No blood in stool. She took dicyclomine  and zofran  this morning for LLQ discomfort.   Recent labs showed persistent iron  deficiency anemia in setting of oral iron  intolerance - pending iron  infusions at [facility] off [address] in [place].   Coccydynia - persists. No significant skin changes on prior exam.   GERD - notes nexium  is more expensive than omeprazole , somewhat effective. Continues nexium  40mg  daily, also takes pepcid  at bedtime prn.  Notes mouth stays very dry. Also has been noted to have dry eyes - managed with eye drops (refresh, blink eye drops). No significant skin issues.      Relevant past medical, surgical, family and social history reviewed and updated as indicated. Interim medical history since our last visit reviewed. Allergies and medications reviewed and updated. Outpatient Medications Prior to Visit  Medication Sig Dispense Refill   acidophilus (RISAQUAD) CAPS capsule Take 1 capsule by mouth daily.      ALPRAZolam  (XANAX ) 0.5 MG tablet TAKE ONE TABLET BY MOUTH TWICE A DAY AS NEEDED FOR ANXIETY 40 tablet 0   aspirin  EC 81 MG tablet Take 1 tablet (81 mg total) by mouth in the morning and at bedtime. Swallow whole.     atorvastatin  (LIPITOR) 10 MG tablet TAKE ONE TABLET BY MOUTH ONE TIME DAILY 90 tablet 3   Calcium  Carb-Cholecalciferol  (CALCIUM -VITAMIN D ) 600-400 MG-UNIT TABS Take 1 tablet by mouth daily.     carvedilol  (COREG ) 6.25 MG tablet TAKE ONE TABLET BY MOUTH TWICE A DAY WITH FOOD 180 tablet 3   cephALEXin  (KEFLEX ) 250 MG capsule Take 250 mg by mouth daily.     cholecalciferol  (VITAMIN D ) 25 MCG (1000 UNIT) tablet Take 1,000 Units by mouth daily.     dicyclomine  (BENTYL ) 10 MG capsule Take 1 capsule (10 mg total) by mouth 2 (two) times daily as needed for spasms. 90 capsule 1   esomeprazole  (NEXIUM ) 40 MG capsule Take 1 capsule (40 mg total) by mouth daily.     estradiol  (ESTRACE ) 0.1 MG/GM vaginal cream Use 1/2 g vaginally two or three times per week as needed to maintain symptom relief. 42.5 g 2   fexofenadine (ALLEGRA) 180 MG tablet Take 180 mg by mouth daily as needed for allergies.     gabapentin  (NEURONTIN ) 300 MG capsule Take 1 capsule (300 mg total) by mouth at bedtime. 90 capsule 1   losartan  (COZAAR ) 50 MG tablet Take  1.5 tablets (75 mg total) by mouth daily. 135 tablet 4   ondansetron  (ZOFRAN ) 4 MG tablet TAKE ONE TABLET BY MOUTH EVERY 8 HOURS AS NEEDED FOR NAUSEA AND VOMITING 20 tablet 0   polyethylene glycol powder (GLYCOLAX /MIRALAX ) 17 GM/SCOOP powder Take 17 g by mouth 2 (two) times daily as needed for moderate constipation (hold for diarrhea).     sucralfate  (CARAFATE ) 1 g tablet Take 1 tablet (1 g total) by mouth 3 (three) times daily with meals as needed (reflux). 60 tablet 0   valACYclovir  (VALTREX ) 500 MG tablet Take one tablet (500 mg) by mouth twice a day for 3 days as needed for a genital outbreak.  Take 4 tablets (2000 mg) by mouth twice a day for 1 day as needed for an  oral outbreak. 30 tablet 1   Cyanocobalamin  (B-12) 1000 MCG SUBL Place 1 tablet under the tongue every Monday, Wednesday, and Friday.     No facility-administered medications prior to visit.     Per HPI unless specifically indicated in ROS section below Review of Systems  Objective:  BP 122/60   Pulse 63   Temp 97.9 F (36.6 C) (Oral)   Ht 5\' 3"  (1.6 m)   Wt 137 lb (62.1 kg)   LMP 10/20/1979 (Within Years)   SpO2 99%   BMI 24.27 kg/m   Wt Readings from Last 3 Encounters:  02/16/24 137 lb (62.1 kg)  02/08/24 136 lb 8 oz (61.9 kg)  12/15/23 136 lb (61.7 kg)      Physical Exam Vitals and nursing note reviewed.  Constitutional:      Appearance: Normal appearance. She is not ill-appearing.  HENT:     Head: Normocephalic and atraumatic.     Mouth/Throat:     Mouth: Mucous membranes are moist.     Pharynx: Oropharynx is clear. No oropharyngeal exudate or posterior oropharyngeal erythema.  Eyes:     Extraocular Movements: Extraocular movements intact.     Conjunctiva/sclera: Conjunctivae normal.     Pupils: Pupils are equal, round, and reactive to light.  Cardiovascular:     Rate and Rhythm: Normal rate and regular rhythm.     Pulses: Normal pulses.     Heart sounds: Normal heart sounds. No murmur heard. Pulmonary:     Effort: Pulmonary effort is normal. No respiratory distress.     Breath sounds: Normal breath sounds. No wheezing, rhonchi or rales.  Skin:    General: Skin is warm and dry.     Findings: No rash.  Neurological:     Mental Status: She is alert.  Psychiatric:        Mood and Affect: Mood normal.        Behavior: Behavior normal.       Results for orders placed or performed in visit on 02/08/24  Comprehensive metabolic panel with GFR   Collection Time: 02/08/24  4:02 PM  Result Value Ref Range   Sodium 139 135 - 145 mEq/L   Potassium 4.2 3.5 - 5.1 mEq/L   Chloride 103 96 - 112 mEq/L   CO2 27 19 - 32 mEq/L   Glucose, Bld 99 70 - 99 mg/dL   BUN 17  6 - 23 mg/dL   Creatinine, Ser 0.98 0.40 - 1.20 mg/dL   Total Bilirubin 0.3 0.2 - 1.2 mg/dL   Alkaline Phosphatase 92 39 - 117 U/L   AST 12 0 - 37 U/L   ALT 8 0 - 35 U/L   Total Protein 6.7  6.0 - 8.3 g/dL   Albumin 4.0 3.5 - 5.2 g/dL   GFR 16.10 >96.04 mL/min   Calcium  10.4 8.4 - 10.5 mg/dL  CBC with Differential/Platelet   Collection Time: 02/08/24  4:02 PM  Result Value Ref Range   WBC 9.8 4.0 - 10.5 K/uL   RBC 4.02 3.87 - 5.11 Mil/uL   Hemoglobin 10.9 (L) 12.0 - 15.0 g/dL   HCT 54.0 (L) 98.1 - 19.1 %   MCV 85.5 78.0 - 100.0 fl   MCHC 31.7 30.0 - 36.0 g/dL   RDW 47.8 29.5 - 62.1 %   Platelets 179.0 150.0 - 400.0 K/uL   Neutrophils Relative % 48.7 43.0 - 77.0 %   Lymphocytes Relative 39.9 12.0 - 46.0 %   Monocytes Relative 8.2 3.0 - 12.0 %   Eosinophils Relative 1.9 0.0 - 5.0 %   Basophils Relative 1.3 0.0 - 3.0 %   Neutro Abs 4.8 1.4 - 7.7 K/uL   Lymphs Abs 3.9 0.7 - 4.0 K/uL   Monocytes Absolute 0.8 0.1 - 1.0 K/uL   Eosinophils Absolute 0.2 0.0 - 0.7 K/uL   Basophils Absolute 0.1 0.0 - 0.1 K/uL  TSH   Collection Time: 02/08/24  4:02 PM  Result Value Ref Range   TSH 1.47 0.35 - 5.50 uIU/mL  IBC panel   Collection Time: 02/08/24  4:02 PM  Result Value Ref Range   Iron  25 (L) 42 - 145 ug/dL   Transferrin 308.6 578.4 - 360.0 mg/dL   Saturation Ratios 6.3 (L) 20.0 - 50.0 %   TIBC 394.8 250.0 - 450.0 mcg/dL  Ferritin   Collection Time: 02/08/24  4:02 PM  Result Value Ref Range   Ferritin 13.9 10.0 - 291.0 ng/mL  Vitamin B12   Collection Time: 02/08/24  4:02 PM  Result Value Ref Range   Vitamin B-12 472 211 - 911 pg/mL  POCT Urinalysis Dipstick (Automated)   Collection Time: 02/08/24  4:10 PM  Result Value Ref Range   Color, UA yellow    Clarity, UA clear    Glucose, UA Negative Negative   Bilirubin, UA negative    Ketones, UA negative    Spec Grav, UA 1.015 1.010 - 1.025   Blood, UA negative    pH, UA 6.0 5.0 - 8.0   Protein, UA Negative Negative    Urobilinogen, UA 0.2 0.2 or 1.0 E.U./dL   Nitrite, UA negative    Leukocytes, UA Moderate (2+) (A) Negative  Urine Culture   Collection Time: 02/09/24  7:31 AM   Specimen: Urine  Result Value Ref Range   MICRO NUMBER: [id number]    SPECIMEN QUALITY: Adequate    Sample Source URINE    STATUS: FINAL    Result:      Less than 10,000 CFU/mL of single Gram negative organism isolated. No further testing will be performed. If clinically indicated, recollection using a method to minimize contamination, with prompt transfer to Urine Culture Transport Tube, is recommended.    Assessment & Plan:   Problem List Items Addressed This Visit     Iron  deficiency anemia - Primary   Pending Feraheme infusions  Intolerant to oral iron .       Irritable bowel syndrome (IBS)   Continues PRN bentyl .  Could consider SNRI.       Relevant Medications   senna (SENOKOT) 8.6 MG tablet   Coccyx pain   This is improved      Chronic constipation   Continue miralax , sennakot daily. Continue  good water and dietary fiber intake.       Relevant Medications   senna (SENOKOT) 8.6 MG tablet   Dry mouth   Bentyl  could contribute. I don't see other possible pharmacological cause.  Try Orajel PRN.  Consider eval for sjogren syndrome if ongoing.         Meds ordered this encounter  Medications   senna (SENOKOT) 8.6 MG tablet    Sig: Take 1 tablet (8.6 mg total) by mouth daily.    No orders of the defined types were placed in this encounter.   Patient Instructions  Good to see you today I'm glad you're doing better.  Continue current medicines.  Could try orajel products for mouth dryness - let me know if ongoing to consider further evaluation with labwork.  Return after 05/30/2024 for physical/wellness visit   Follow up plan: Return in about 4 months (around 06/17/2024) for annual exam, prior fasting for blood work, medicare wellness visit.  Claire Crick, MD

## 2024-02-16 NOTE — Assessment & Plan Note (Signed)
 Bentyl  could contribute. I don't see other possible pharmacological cause.  Try Orajel PRN.  Consider eval for sjogren syndrome if ongoing.

## 2024-02-16 NOTE — Patient Instructions (Addendum)
 Good to see you today I'm glad you're doing better.  Continue current medicines.  Could try orajel products for mouth dryness - let me know if ongoing to consider further evaluation with labwork.  Return after 05/30/2024 for physical/wellness visit

## 2024-02-21 ENCOUNTER — Other Ambulatory Visit: Payer: Self-pay | Admitting: Family Medicine

## 2024-02-21 DIAGNOSIS — M1611 Unilateral primary osteoarthritis, right hip: Secondary | ICD-10-CM

## 2024-02-22 ENCOUNTER — Other Ambulatory Visit: Payer: Self-pay | Admitting: Family Medicine

## 2024-02-22 DIAGNOSIS — R112 Nausea with vomiting, unspecified: Secondary | ICD-10-CM

## 2024-02-22 NOTE — Telephone Encounter (Signed)
 Zofran  Last filled:  01/31/24, #20 Last OV:  02/16/24, 3 mo f/u Next OV:  none

## 2024-02-22 NOTE — Telephone Encounter (Signed)
 Per 11/29/23 OV notes, pt stopped Robaxin .   Spoke with pt notifying her of refill request and asking if she restarted med. Pt confirms she did after a visit with Dr Aubry Blase (ortho). States she mentioned occasionally having cramps in R hip and buttock. Muscle relaxer was suggested and pt informed Dr Aubry Blase she was taking Robaxin  via Dr Crissie Dome but stopped. Pt wants to restart rx.  Last filled:  10/18/23, #30 Last OV:  02/16/24, 3 mo f/u Next OV:  none

## 2024-02-23 NOTE — Telephone Encounter (Signed)
 ERx

## 2024-02-28 ENCOUNTER — Ambulatory Visit: Admitting: *Deleted

## 2024-02-28 VITALS — BP 121/65 | HR 65 | Temp 97.4°F | Resp 16 | Ht 63.0 in | Wt 138.8 lb

## 2024-02-28 DIAGNOSIS — D509 Iron deficiency anemia, unspecified: Secondary | ICD-10-CM

## 2024-02-28 MED ORDER — SODIUM CHLORIDE 0.9 % IV SOLN
510.0000 mg | Freq: Once | INTRAVENOUS | Status: AC
  Administered 2024-02-28: 510 mg via INTRAVENOUS
  Filled 2024-02-28: qty 17

## 2024-02-28 NOTE — Patient Instructions (Signed)

## 2024-02-28 NOTE — Progress Notes (Signed)
 Diagnosis: Iron  Deficiency Anemia  Provider:  Mannam, Praveen MD  Procedure: IV Infusion  IV Type: Peripheral, IV Location: R Hand  Feraheme (Ferumoxytol), Dose: 510 mg  Infusion Start Time: 1418 pm  Infusion Stop Time: 1435 pm  Post Infusion IV Care: Observation period completed and Peripheral IV Discontinued  Discharge: Condition: Good, Destination: Home . AVS Provided  Performed by:  Mayme Spearman, RN

## 2024-03-03 ENCOUNTER — Ambulatory Visit: Payer: Self-pay

## 2024-03-03 NOTE — Telephone Encounter (Signed)
 Pt notified of Dr. Belva Boyden comments (ER precautions given)

## 2024-03-03 NOTE — Telephone Encounter (Signed)
 I will see her then but if symptoms worsen over weekend needs to go ER / especially if she feels faint

## 2024-03-03 NOTE — Telephone Encounter (Signed)
 Copied from CRM [id number]. Topic: Clinical - Red Word Triage >> Mar 03, 2024  4:09 PM [name] wrote: Red Word that prompted transfer to Nurse Triage: Patient states that she has been nauseous for 2 months and she feels like she is going to pass out.   Chief Complaint: Nausea  Symptoms: Nausea, intermittent fatigue  Frequency: Intermittent  Pertinent Negatives: Patient denies vomiting  Disposition: [] ED /[] Urgent Care (no appt availability in office) / [x] Appointment(In office/virtual)/ []  [facility]/ [] Home Care/ [] Refused Recommended Disposition /[]  Mobile Bus/ []  Follow-up with PCP Additional Notes: Patient reports that she has been experiencing intermittent nausea for the last 2-3 months. She states that with her nausea she has also been experiencing increased fatigue. She states that Dr. Mariam Shingles has seen her for the same and believed it was due to an iron  deficiency. She states she had an infusion on Monday but that her symptoms have not improved and that she would like an appointment for evaluation. Appointment made on Monday for the patient.    Reason for Disposition  Nausea lasts > 1 week  Answer Assessment - Initial Assessment Questions 1. NAUSEA SEVERITY: "How bad is the nausea?" (e.g., mild, moderate, severe; dehydration, weight loss)   - MILD: loss of appetite without change in eating habits   - MODERATE: decreased oral intake without significant weight loss, dehydration, or malnutrition   - SEVERE: inadequate caloric or fluid intake, significant weight loss, symptoms of dehydration     Mild to moderate  2. ONSET: "When did the nausea begin?"     A couple of months ago  3. VOMITING: "Any vomiting?" If Yes, ask: "How many times today?"     No 4. RECURRENT SYMPTOM: "Have you had nausea before?" If Yes, ask: "When was the last time?" "What happened that time?"     Ongoing for a couple of months  5. CAUSE: "What do you think is causing the nausea?"      Unsure  Protocols used: Nausea-A-AH

## 2024-03-06 ENCOUNTER — Encounter: Payer: Self-pay | Admitting: Family Medicine

## 2024-03-06 ENCOUNTER — Ambulatory Visit (INDEPENDENT_AMBULATORY_CARE_PROVIDER_SITE_OTHER): Admitting: Family Medicine

## 2024-03-06 ENCOUNTER — Ambulatory Visit

## 2024-03-06 VITALS — BP 104/58 | HR 79 | Temp 97.6°F | Ht 63.0 in | Wt 134.2 lb

## 2024-03-06 DIAGNOSIS — R110 Nausea: Secondary | ICD-10-CM | POA: Diagnosis not present

## 2024-03-06 DIAGNOSIS — D509 Iron deficiency anemia, unspecified: Secondary | ICD-10-CM

## 2024-03-06 DIAGNOSIS — G8929 Other chronic pain: Secondary | ICD-10-CM | POA: Diagnosis not present

## 2024-03-06 DIAGNOSIS — R112 Nausea with vomiting, unspecified: Secondary | ICD-10-CM | POA: Diagnosis not present

## 2024-03-06 DIAGNOSIS — R1012 Left upper quadrant pain: Secondary | ICD-10-CM | POA: Diagnosis not present

## 2024-03-06 MED ORDER — ONDANSETRON HCL 4 MG PO TABS: ORAL_TABLET | ORAL | 0 refills | Status: DC

## 2024-03-06 NOTE — Assessment & Plan Note (Signed)
 Medically complex pt  Chronic left abd pain and nausea/ worsened by constipation from IBS Reviewed records from pcp and GI including studies  Reviewed last labs  Exam is reassuring today  Her meds for constipation are not working no doubt due to low fluid intake (is not restricted by cardiology) Discussed plan for increase fluids and then update Also ref to GI for eval/pt req [facility] as her provider retired from [employer]  Given # to call and schedule Call back and Er precautions noted in detail today

## 2024-03-06 NOTE — Patient Instructions (Addendum)
 Try and get at least 46 oz of fluid daily (mostly water)  Stir bubbles out of ginger ale before drinking it   This is essential to control the constipation and also to prevent utis and keep kidneys healthier   Continue zofran  as needed so you can get the fluids in    Take care of yourself   If symptoms become severe go to theER  I placed a referral to GI  Give them a call later this week  [facility]  [phone]

## 2024-03-06 NOTE — Assessment & Plan Note (Signed)
 Chronic  In setting of IBS, chronic left abd pain / constipation  Also as reaction to iron  infusions   See a/p for abd pain  Zofran  refilled  Needs much more fluid to help bowels which will help this  GI ref made to [facility] also

## 2024-03-06 NOTE — Assessment & Plan Note (Signed)
 Iron  infusions are worsening nausea (which is multi factorial)  She re scheduled the last one   Encouraged to continue zofran  as needed
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
## 2024-03-07 ENCOUNTER — Other Ambulatory Visit: Payer: Self-pay

## 2024-03-07 ENCOUNTER — Emergency Department

## 2024-03-07 ENCOUNTER — Emergency Department
Admission: EM | Admit: 2024-03-07 | Discharge: 2024-03-08 | Disposition: A | Discharge status: Home / Self Care | Attending: Emergency Medicine | Admitting: Emergency Medicine

## 2024-03-07 DIAGNOSIS — Z96649 Presence of unspecified artificial hip joint: Secondary | ICD-10-CM | POA: Diagnosis not present

## 2024-03-07 DIAGNOSIS — R109 Unspecified abdominal pain: Secondary | ICD-10-CM | POA: Insufficient documentation

## 2024-03-07 DIAGNOSIS — R531 Weakness: Secondary | ICD-10-CM | POA: Diagnosis not present

## 2024-03-07 DIAGNOSIS — K7689 Other specified diseases of liver: Secondary | ICD-10-CM | POA: Diagnosis not present

## 2024-03-07 DIAGNOSIS — E860 Dehydration: Secondary | ICD-10-CM | POA: Diagnosis not present

## 2024-03-07 DIAGNOSIS — I700 Atherosclerosis of aorta: Secondary | ICD-10-CM | POA: Diagnosis not present

## 2024-03-07 DIAGNOSIS — I10 Essential (primary) hypertension: Secondary | ICD-10-CM | POA: Insufficient documentation

## 2024-03-07 DIAGNOSIS — K5730 Diverticulosis of large intestine without perforation or abscess without bleeding: Secondary | ICD-10-CM | POA: Diagnosis not present

## 2024-03-07 DIAGNOSIS — R110 Nausea: Secondary | ICD-10-CM | POA: Diagnosis not present

## 2024-03-07 DIAGNOSIS — R197 Diarrhea, unspecified: Secondary | ICD-10-CM | POA: Insufficient documentation

## 2024-03-07 LAB — COMPREHENSIVE METABOLIC PANEL WITH GFR
ALT: 15 U/L (ref 0–44)
AST: 19 U/L (ref 15–41)
Albumin: 4.1 g/dL (ref 3.5–5.0)
Alkaline Phosphatase: 91 U/L (ref 38–126)
Anion gap: 11 (ref 5–15)
BUN: 19 mg/dL (ref 8–23)
CO2: 26 mmol/L (ref 22–32)
Calcium: 10.9 mg/dL — ABNORMAL HIGH (ref 8.9–10.3)
Chloride: 99 mmol/L (ref 98–111)
Creatinine, Ser: 0.93 mg/dL (ref 0.44–1.00)
GFR, Estimated: >60 mL/min (ref >60)
Glucose, Bld: 110 mg/dL — ABNORMAL HIGH (ref 70–99)
Potassium: 4.6 mmol/L (ref 3.5–5.1)
Sodium: 136 mmol/L (ref 135–145)
Total Bilirubin: 0.8 mg/dL (ref 0.0–1.2)
Total Protein: 7.3 g/dL (ref 6.5–8.1)

## 2024-03-07 LAB — CBC WITH DIFFERENTIAL/PLATELET
Abs Immature Granulocytes: 0.08 10*3/uL — ABNORMAL HIGH (ref 0.00–0.07)
Basophils Absolute: 0 10*3/uL (ref 0.0–0.1)
Basophils Relative: 0 %
Eosinophils Absolute: 0.1 10*3/uL (ref 0.0–0.5)
Eosinophils Relative: 1 %
HCT: 36.7 % (ref 36.0–46.0)
Hemoglobin: 11.4 g/dL — ABNORMAL LOW (ref 12.0–15.0)
Immature Granulocytes: 1 %
Lymphocytes Relative: 14 %
Lymphs Abs: 2.1 10*3/uL (ref 0.7–4.0)
MCH: 26.9 pg (ref 26.0–34.0)
MCHC: 31.1 g/dL (ref 30.0–36.0)
MCV: 86.6 fL (ref 80.0–100.0)
Monocytes Absolute: 1.5 10*3/uL — ABNORMAL HIGH (ref 0.1–1.0)
Monocytes Relative: 10 %
Neutro Abs: 11.3 10*3/uL — ABNORMAL HIGH (ref 1.7–7.7)
Neutrophils Relative %: 74 %
Platelets: 156 10*3/uL (ref 150–400)
RBC: 4.24 MIL/uL (ref 3.87–5.11)
RDW: 14.6 % (ref 11.5–15.5)
WBC: 15.2 10*3/uL — ABNORMAL HIGH (ref 4.0–10.5)
nRBC: 0 % (ref 0.0–0.2)

## 2024-03-07 LAB — TROPONIN I (HIGH SENSITIVITY): Troponin I (High Sensitivity): 6 ng/L (ref <18)

## 2024-03-07 MED ORDER — LACTATED RINGERS IV BOLUS
1000.0000 mL | Freq: Once | INTRAVENOUS | Status: AC
  Administered 2024-03-07: 1000 mL via INTRAVENOUS

## 2024-03-07 MED ORDER — ONDANSETRON 8 MG PO TBDP: 8.0000 mg | ORAL_TABLET | Freq: Three times a day (TID) | ORAL | 0 refills | Status: DC | PRN

## 2024-03-07 MED ORDER — AZITHROMYCIN 500 MG PO TABS: 500.0000 mg | ORAL_TABLET | Freq: Every day | ORAL | 0 refills | Status: AC

## 2024-03-07 MED ORDER — DICYCLOMINE HCL 10 MG PO CAPS
10.0000 mg | ORAL_CAPSULE | Freq: Once | ORAL | Status: AC
Start: 2024-03-07 — End: 2024-03-07
  Administered 2024-03-07: 10 mg via ORAL

## 2024-03-07 MED ORDER — DICYCLOMINE HCL 10 MG PO CAPS
ORAL_CAPSULE | ORAL | Status: AC
  Filled 2024-03-07: qty 1

## 2024-03-07 MED ORDER — IOHEXOL 300 MG/ML  SOLN
100.0000 mL | Freq: Once | INTRAMUSCULAR | Status: AC | PRN
  Administered 2024-03-07: 100 mL via INTRAVENOUS

## 2024-03-07 NOTE — ED Provider Notes (Signed)
 [facility] Provider Note   Event Date/Time   First MD Initiated Contact with Patient 03/07/24 2039     (approximate) History  Weakness  HPI Bianca Fry is a 78 y.o. female with a past medical history of IBS, PAD, hypertension, degenerative disc disease, PSVT, and prediabetes who presents complaining of lightheadedness, nausea, and generalized weakness since a hip replacement in February of this year.  Patient states that she has been taking Zofran  for her symptoms however today she has had significant lightheadedness.  Patient was found to be hypotensive in triage at 112/50.  Patient denies any medication changes or decreased p.o. intake.  Patient does endorse generalized migratory abdominal pain over the past 24 hours that has been associated with her symptoms. ROS: Patient currently denies any vision changes, tinnitus, difficulty speaking, facial droop, sore throat, chest pain, shortness of breath, vomiting/diarrhea, dysuria, or weakness/numbness/paresthesias in any extremity   Physical Exam  Triage Vital Signs: ED Triage Vitals  Encounter Vitals Group     BP 03/07/24 2002 (!) 112/50     Systolic BP Percentile --      Diastolic BP Percentile --      Pulse Rate 03/07/24 1955 84     Resp 03/07/24 1955 18     Temp 03/07/24 1955 98 F (36.7 C)     Temp Source 03/07/24 1955 Oral     SpO2 03/07/24 1955 100 %     Weight 03/07/24 1958 134 lb 4 oz (60.9 kg)     Height 03/07/24 1958 5\' 3"  (1.6 m)     Head Circumference --      Peak Flow --      Pain Score 03/07/24 1958 0     Pain Loc --      Pain Education --      Exclude from Growth Chart --    Most recent vital signs: Vitals:   03/07/24 2002 03/07/24 2210  BP: (!) 112/50 (!) 123/45  Pulse:  77  Resp:  18  Temp:    SpO2:  97%   General: Awake, oriented x4. CV:  Good peripheral perfusion.  Resp:  Normal effort.  Abd:  No distention. Other:  Elderly well-developed, well-nourished Caucasian female  resting comfortably in no acute distress ED Results / Procedures / Treatments  Labs (all labs ordered are listed, but only abnormal results are displayed) Labs Reviewed  CBC WITH DIFFERENTIAL/PLATELET - Abnormal; Notable for the following components:      Result Value   WBC 15.2 (*)    Hemoglobin 11.4 (*)    Neutro Abs 11.3 (*)    Monocytes Absolute 1.5 (*)    Abs Immature Granulocytes 0.08 (*)    All other components within normal limits  COMPREHENSIVE METABOLIC PANEL WITH GFR - Abnormal; Notable for the following components:   Glucose, Bld 110 (*)    Calcium  10.9 (*)    All other components within normal limits  URINALYSIS, ROUTINE W REFLEX MICROSCOPIC  TROPONIN I (HIGH SENSITIVITY)  TROPONIN I (HIGH SENSITIVITY)   EKG ED ECG REPORT I, Charleen Conn, the attending physician, personally viewed and interpreted this ECG. Date: 03/07/2024 EKG Time: 2000 Rate: 80 Rhythm: normal sinus rhythm QRS Axis: normal Intervals: normal ST/T Wave abnormalities: normal Narrative Interpretation: no evidence of acute ischemia RADIOLOGY ED MD interpretation: CT of the abdomen and pelvis with IV contrast independently interpreted and shows no evidence of acute findings in the abdomen or pelvis -Agree with radiology assessment Official  radiology report(s): CT ABDOMEN PELVIS W CONTRAST Result Date: 03/07/2024 CLINICAL DATA:  Abdominal pain EXAM: CT ABDOMEN AND PELVIS WITH CONTRAST TECHNIQUE: Multidetector CT imaging of the abdomen and pelvis was performed using the standard protocol following bolus administration of intravenous contrast. RADIATION DOSE REDUCTION: This exam was performed according to the departmental dose-optimization program which includes automated exposure control, adjustment of the mA and/or kV according to patient size and/or use of iterative reconstruction technique. CONTRAST:  OMNIPAQUE  IOHEXOL  300 MG/ML  SOLN COMPARISON:  11/07/2023 FINDINGS: Lower chest: No acute  abnormality Hepatobiliary: Prior cholecystectomy. Pneumobilia noted. No biliary ductal dilatation. No suspicious focal hepatic abnormality. Subcentimeter cyst in the right hepatic lobe, unchanged. Pancreas: No focal abnormality or ductal dilatation. Spleen: No focal abnormality.  Normal size. Adrenals/Urinary Tract: No adrenal abnormality. No focal renal abnormality. No stones or hydronephrosis. Urinary bladder is unremarkable. Stomach/Bowel: Sigmoid diverticulosis. No active diverticulitis. Stomach and small bowel decompressed, unremarkable. Vascular/Lymphatic: Aortic atherosclerosis. No evidence of aneurysm or adenopathy. Reproductive: Prior hysterectomy.  No adnexal masses. Other: No free fluid or free air. Musculoskeletal: Prior right hip replacement. Postoperative changes from posterior fusion in the lower lumbar spine. No acute bony abnormality. IMPRESSION: No acute findings in the abdomen or pelvis. Aortic atherosclerosis. Electronically Signed   By: Janeece Mechanic M.D.   On: 03/07/2024 23:24   PROCEDURES: Critical Care performed: No .1-3 Lead EKG Interpretation  Performed by: Charleen Conn, MD Authorized by: Charleen Conn, MD     Interpretation: normal     ECG rate:  71   ECG rate assessment: normal     Rhythm: sinus rhythm     Ectopy: none     Conduction: normal    MEDICATIONS ORDERED IN ED: Medications  dicyclomine  (BENTYL ) capsule 10 mg (10 mg Oral Total Dose 03/07/24 2210)  lactated ringers  bolus 1,000 mL (1,000 mLs Intravenous New Bag/Given 03/07/24 2209)  iohexol  (OMNIPAQUE ) 300 MG/ML solution 100 mL (100 mLs Intravenous Contrast Given 03/07/24 2309)   IMPRESSION / MDM / ASSESSMENT AND PLAN / ED COURSE  I reviewed the triage vital signs and the nursing notes.                             The patient is on the cardiac monitor to evaluate for evidence of arrhythmia and/or significant heart rate changes. Patient's presentation is most consistent with acute presentation with  potential threat to life or bodily function. This patient presents with diarrhea consistent with likely viral enteritis. Doubt acute bacterial diarrhea. Considered, but think unlikely, partial SBO, appendicitis, diverticulitis, other intraabdominal infection. Low suspicion for secondary causes of diarrhea such as hyperadrenergic state, pheo, adrenal crisis, hyperthyroidism, or sepsis. Doubt antibiotic associated diarrhea.  Plan: PO rehydration, reassess, discharge with OTC antidiarrheal meds//short course antibiotics  Dispo: Discharge home with PCP follow-up and strict return precautions   FINAL CLINICAL IMPRESSION(S) / ED DIAGNOSES   Final diagnoses:  Generalized weakness  Dehydration  Diarrhea, unspecified type   Rx / DC Orders   ED Discharge Orders          Ordered    azithromycin (ZITHROMAX) 500 MG tablet  Daily        03/07/24 2355    ondansetron  (ZOFRAN -ODT) 8 MG disintegrating tablet  Every 8 hours PRN        03/07/24 2355           Note:  This document was prepared using Dragon voice recognition software  and may include unintentional dictation errors.   Daliyah Sramek K, MD 03/07/24 [phone]

## 2024-03-07 NOTE — ED Triage Notes (Signed)
 POV with CC of increased weakness today with dizziness and nausea without vomiting. Reports recurrent episodes since hip replacement in February but got worse today. A&Ox4.
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
## 2024-03-07 NOTE — Group Note (Deleted)
 Date:  03/07/2024 Time:  9:45 PM  Group Topic/Focus:  Wrap-Up Group:   The focus of this group is to help patients review their daily goal of treatment and discuss progress on daily workbooks.     Participation Level:  {BHH PARTICIPATION EAVWU:98119}  Participation Quality:  {BHH PARTICIPATION QUALITY:22265}  Affect:  {BHH AFFECT:22266}  Cognitive:  {BHH COGNITIVE:22267}  Insight: {BHH Insight2:20797}  Engagement in Group:  {BHH ENGAGEMENT IN JYNWG:95621}  Modes of Intervention:  {BHH MODES OF INTERVENTION:22269}  Additional Comments:  ***  Maglione,Preciosa Bundrick E 03/07/2024, 9:45 PM

## 2024-03-07 NOTE — ED Notes (Signed)
 Pt to CT

## 2024-03-07 NOTE — ED Notes (Signed)
 Lab called for this pt at 2012.

## 2024-03-08 MED ORDER — FAMOTIDINE IN NACL 20-0.9 MG/50ML-% IV SOLN
20.0000 mg | Freq: Once | INTRAVENOUS | Status: AC
  Administered 2024-03-08: 20 mg via INTRAVENOUS
  Filled 2024-03-08: qty 50

## 2024-03-08 NOTE — ED Notes (Signed)
 Pt discharged to ED circle at this time and left with all belongings. Pt ABCs intact. RR even and unlabored. Pt in NAD. Pt denies further needs from this RN.

## 2024-03-08 NOTE — ED Notes (Signed)
 Pt ABCs intact. RR even and unlabored. Pt in NAD. Bed in lowest locked position. Call bell in reach. Denies needs at this time.

## 2024-03-08 NOTE — ED Notes (Signed)
 Pt feeling much better after receiving Pepcid  and fluids. Pt ABCs intact. RR even and unlabored. Pt in NAD. Bed in lowest locked position. Call bell in reach. Denies needs at this time.

## 2024-03-08 NOTE — ED Notes (Signed)
 MD at bedside at this time. Pt ABCs intact. RR even and unlabored. Pt in NAD. Bed in lowest locked position. Call bell in reach. Pt needing to finish fluids and plan to receive Pepcid  for GI symptoms.

## 2024-03-15 ENCOUNTER — Ambulatory Visit (INDEPENDENT_AMBULATORY_CARE_PROVIDER_SITE_OTHER)

## 2024-03-15 VITALS — BP 106/65 | HR 67 | Temp 97.8°F | Resp 18 | Ht 63.0 in | Wt 134.6 lb

## 2024-03-15 DIAGNOSIS — D509 Iron deficiency anemia, unspecified: Secondary | ICD-10-CM | POA: Diagnosis not present

## 2024-03-15 MED ORDER — SODIUM CHLORIDE 0.9 % IV SOLN
510.0000 mg | Freq: Once | INTRAVENOUS | Status: AC
  Administered 2024-03-15: 510 mg via INTRAVENOUS
  Filled 2024-03-15: qty 17

## 2024-03-15 NOTE — Progress Notes (Signed)
 Diagnosis: Iron  Deficiency Anemia  Provider:  Praveen Mannam MD  Procedure: IV Infusion  IV Type: Peripheral, IV Location: Lwrist  Feraheme (Ferumoxytol ), Dose: 510 mg  Infusion Start Time: 1357  Infusion Stop Time: 1415  Post Infusion IV Care: Observation period completed and Peripheral IV Discontinued  Discharge: Condition: Good, Destination: Home . AVS Provided  Performed by:  Star East, LPN

## 2024-03-17 DIAGNOSIS — H9201 Otalgia, right ear: Secondary | ICD-10-CM | POA: Diagnosis not present

## 2024-03-17 DIAGNOSIS — M26621 Arthralgia of right temporomandibular joint: Secondary | ICD-10-CM | POA: Diagnosis not present

## 2024-03-20 ENCOUNTER — Other Ambulatory Visit: Payer: Self-pay

## 2024-03-20 DIAGNOSIS — F419 Anxiety disorder, unspecified: Secondary | ICD-10-CM

## 2024-03-20 MED ORDER — ALPRAZOLAM 0.5 MG PO TABS
ORAL_TABLET | ORAL | 0 refills | Status: DC
Start: 2024-03-20 — End: 2024-05-17

## 2024-03-20 NOTE — Telephone Encounter (Signed)
 Name of Medication:  Alprazolam   Name of Pharmacy:  [facility] Last Fill or Written Date and Quantity:  01/05/24, #40 Last Office Visit and Type:  02/16/24, 3 mo f/u Next Office Visit and Type:  none Last Controlled Substance Agreement Date:  04/16/17 Last UDS:  04/16/17

## 2024-03-20 NOTE — Telephone Encounter (Signed)
 ERx

## 2024-03-21 DIAGNOSIS — N3020 Other chronic cystitis without hematuria: Secondary | ICD-10-CM | POA: Diagnosis not present

## 2024-03-21 DIAGNOSIS — R3982 Chronic bladder pain: Secondary | ICD-10-CM | POA: Diagnosis not present

## 2024-03-22 DIAGNOSIS — M5416 Radiculopathy, lumbar region: Secondary | ICD-10-CM | POA: Diagnosis not present

## 2024-03-22 DIAGNOSIS — M5441 Lumbago with sciatica, right side: Secondary | ICD-10-CM | POA: Diagnosis not present

## 2024-03-22 DIAGNOSIS — G8929 Other chronic pain: Secondary | ICD-10-CM | POA: Diagnosis not present

## 2024-03-22 DIAGNOSIS — M5442 Lumbago with sciatica, left side: Secondary | ICD-10-CM | POA: Diagnosis not present

## 2024-03-27 ENCOUNTER — Other Ambulatory Visit: Payer: Self-pay | Admitting: Physical Medicine & Rehabilitation

## 2024-03-27 DIAGNOSIS — M5442 Lumbago with sciatica, left side: Secondary | ICD-10-CM

## 2024-03-27 DIAGNOSIS — M5441 Lumbago with sciatica, right side: Secondary | ICD-10-CM

## 2024-04-05 ENCOUNTER — Other Ambulatory Visit: Payer: Self-pay | Admitting: Family Medicine

## 2024-04-05 DIAGNOSIS — R112 Nausea with vomiting, unspecified: Secondary | ICD-10-CM

## 2024-04-06 ENCOUNTER — Ambulatory Visit
Admission: RE | Admit: 2024-04-06 | Discharge: 2024-04-06 | Disposition: A | Discharge status: Home / Self Care | Source: Ambulatory Visit | Attending: Physical Medicine & Rehabilitation | Admitting: Physical Medicine & Rehabilitation

## 2024-04-06 DIAGNOSIS — M5441 Lumbago with sciatica, right side: Secondary | ICD-10-CM

## 2024-04-06 DIAGNOSIS — M5442 Lumbago with sciatica, left side: Secondary | ICD-10-CM

## 2024-04-06 DIAGNOSIS — M5416 Radiculopathy, lumbar region: Secondary | ICD-10-CM | POA: Diagnosis not present

## 2024-04-06 DIAGNOSIS — Z981 Arthrodesis status: Secondary | ICD-10-CM | POA: Diagnosis not present

## 2024-04-06 NOTE — Telephone Encounter (Signed)
 Zofran  Last filled:  03/08/19/25, #20 Last OV:  02/16/24, 3 mo f/u Next OV:  none

## 2024-04-12 DIAGNOSIS — K5730 Diverticulosis of large intestine without perforation or abscess without bleeding: Secondary | ICD-10-CM | POA: Diagnosis not present

## 2024-04-12 DIAGNOSIS — R110 Nausea: Secondary | ICD-10-CM | POA: Diagnosis not present

## 2024-04-12 DIAGNOSIS — R1032 Left lower quadrant pain: Secondary | ICD-10-CM | POA: Diagnosis not present

## 2024-04-12 DIAGNOSIS — G8929 Other chronic pain: Secondary | ICD-10-CM | POA: Diagnosis not present

## 2024-04-12 DIAGNOSIS — K581 Irritable bowel syndrome with constipation: Secondary | ICD-10-CM | POA: Diagnosis not present

## 2024-04-12 DIAGNOSIS — Z8719 Personal history of other diseases of the digestive system: Secondary | ICD-10-CM | POA: Diagnosis not present

## 2024-04-17 DIAGNOSIS — H01023 Squamous blepharitis right eye, unspecified eyelid: Secondary | ICD-10-CM | POA: Diagnosis not present

## 2024-04-17 DIAGNOSIS — H02051 Trichiasis without entropian right upper eyelid: Secondary | ICD-10-CM | POA: Diagnosis not present

## 2024-04-17 DIAGNOSIS — H524 Presbyopia: Secondary | ICD-10-CM | POA: Diagnosis not present

## 2024-04-17 DIAGNOSIS — H02401 Unspecified ptosis of right eyelid: Secondary | ICD-10-CM | POA: Diagnosis not present

## 2024-04-17 DIAGNOSIS — H01021 Squamous blepharitis right upper eyelid: Secondary | ICD-10-CM | POA: Diagnosis not present

## 2024-04-17 DIAGNOSIS — H16141 Punctate keratitis, right eye: Secondary | ICD-10-CM | POA: Diagnosis not present

## 2024-04-17 DIAGNOSIS — Z961 Presence of intraocular lens: Secondary | ICD-10-CM | POA: Diagnosis not present

## 2024-04-24 DIAGNOSIS — K5900 Constipation, unspecified: Secondary | ICD-10-CM | POA: Diagnosis not present

## 2024-04-24 DIAGNOSIS — R109 Unspecified abdominal pain: Secondary | ICD-10-CM | POA: Diagnosis not present

## 2024-04-24 DIAGNOSIS — R110 Nausea: Secondary | ICD-10-CM | POA: Diagnosis not present

## 2024-04-26 DIAGNOSIS — M5441 Lumbago with sciatica, right side: Secondary | ICD-10-CM | POA: Diagnosis not present

## 2024-04-26 DIAGNOSIS — M5416 Radiculopathy, lumbar region: Secondary | ICD-10-CM | POA: Diagnosis not present

## 2024-04-26 DIAGNOSIS — G8929 Other chronic pain: Secondary | ICD-10-CM | POA: Diagnosis not present

## 2024-04-26 DIAGNOSIS — M5442 Lumbago with sciatica, left side: Secondary | ICD-10-CM | POA: Diagnosis not present

## 2024-05-09 ENCOUNTER — Ambulatory Visit: Admitting: Podiatry

## 2024-05-09 DIAGNOSIS — G6181 Chronic inflammatory demyelinating polyneuritis: Secondary | ICD-10-CM | POA: Diagnosis not present
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
## 2024-05-17 ENCOUNTER — Other Ambulatory Visit: Payer: Self-pay | Admitting: Family Medicine

## 2024-05-17 DIAGNOSIS — L820 Inflamed seborrheic keratosis: Secondary | ICD-10-CM | POA: Diagnosis not present

## 2024-05-17 DIAGNOSIS — D2262 Melanocytic nevi of left upper limb, including shoulder: Secondary | ICD-10-CM | POA: Diagnosis not present

## 2024-05-17 DIAGNOSIS — L570 Actinic keratosis: Secondary | ICD-10-CM | POA: Diagnosis not present

## 2024-05-17 DIAGNOSIS — F419 Anxiety disorder, unspecified: Secondary | ICD-10-CM

## 2024-05-17 DIAGNOSIS — L821 Other seborrheic keratosis: Secondary | ICD-10-CM | POA: Diagnosis not present

## 2024-05-17 DIAGNOSIS — L814 Other melanin hyperpigmentation: Secondary | ICD-10-CM | POA: Diagnosis not present

## 2024-05-17 DIAGNOSIS — C44612 Basal cell carcinoma of skin of right upper limb, including shoulder: Secondary | ICD-10-CM | POA: Diagnosis not present

## 2024-05-17 DIAGNOSIS — Z85828 Personal history of other malignant neoplasm of skin: Secondary | ICD-10-CM | POA: Diagnosis not present

## 2024-05-17 DIAGNOSIS — D692 Other nonthrombocytopenic purpura: Secondary | ICD-10-CM | POA: Diagnosis not present

## 2024-05-17 DIAGNOSIS — D485 Neoplasm of uncertain behavior of skin: Secondary | ICD-10-CM | POA: Diagnosis not present

## 2024-05-17 MED ORDER — ALPRAZOLAM 0.5 MG PO TABS: ORAL_TABLET | ORAL | 0 refills | Status: DC

## 2024-05-17 MED ORDER — RISAQUAD PO CAPS: 1.0000 | ORAL_CAPSULE | Freq: Every day | ORAL | 1 refills | Status: AC

## 2024-05-17 NOTE — Telephone Encounter (Signed)
 Copied from CRM [id number]. Topic: Clinical - Medication Refill >> May 17, 2024  9:02 AM Gennette ORN wrote: Medication: acidophilus (RISAQUAD) CAPS capsule ALPRAZolam  (XANAX ) 0.5 MG tablet   Has the patient contacted their pharmacy? Yes (Agent: If no, request that the patient contact the pharmacy for the refill. If patient does not wish to contact the pharmacy document the reason why and proceed with request.) (Agent: If yes, when and what did the pharmacy advise?)  This is the patient's preferred pharmacy:  [facility] [address] [facility] - [place], [place] - [address] [place] [address] Phone: [phone] Fax: [phone]  Is this the correct pharmacy for this prescription? Yes If no, delete pharmacy and type the correct one.   Has the prescription been filled recently? Yes  Is the patient out of the medication? No  Has the patient been seen for an appointment in the last year OR does the patient have an upcoming appointment? Yes  Can we respond through MyChart? Yes  Agent: Please be advised that Rx refills may take up to 3 business days. We ask that you follow-up with your pharmacy.

## 2024-05-17 NOTE — Telephone Encounter (Signed)
 ERx

## 2024-05-17 NOTE — Telephone Encounter (Signed)
 See where this was d/c from patient chart. Did you restart?

## 2024-05-17 NOTE — Telephone Encounter (Signed)
 Name of Medication:  Alprazolam   Name of Pharmacy:  [facility] Last Fill or Written Date and Quantity:  03/20/24 #40 Last Office Visit and Type:  02/16/24, 3 mo f/u Next Office Visit and Type:  none Last Controlled Substance Agreement Date:  04/16/17 Last UDS:  04/16/17

## 2024-05-25 ENCOUNTER — Other Ambulatory Visit: Payer: Self-pay | Admitting: Family Medicine

## 2024-05-25 DIAGNOSIS — R112 Nausea with vomiting, unspecified: Secondary | ICD-10-CM

## 2024-05-25 NOTE — Telephone Encounter (Signed)
 Zofran  Last filled:  04/10/24, #20 Last OV:  02/16/24, 3 mo f/u; saw Dr Randeen 03/06/24 Next OV:  none

## 2024-05-25 NOTE — Telephone Encounter (Unsigned)
 Copied from CRM [id number]. Topic: Clinical - Medication Refill >> May 25, 2024 10:02 AM Deleta RAMAN wrote: Medication: ZOFRAN  4 MG tablet  Has the patient contacted their pharmacy? Yes (Agent: If no, request that the patient contact the pharmacy for the refill. If patient does not wish to contact the pharmacy document the reason why and proceed with request.) (Agent: If yes, when and what did the pharmacy advise?)  This is the patient's preferred pharmacy:  [facility] [address] [place] - [address] [place] [address] Phone: [phone] Fax: [phone]  Is this the correct pharmacy for this prescription? Yes If no, delete pharmacy and type the correct one.   Has the prescription been filled recently? Yes  Is the patient out of the medication? No  Has the patient been seen for an appointment in the last year OR does the patient have an upcoming appointment? Yes  Can we respond through MyChart? Yes  Agent: Please be advised that Rx refills may take up to 3 business days. We ask that you follow-up with your pharmacy.

## 2024-05-30 MED ORDER — ONDANSETRON HCL 4 MG PO TABS: ORAL_TABLET | ORAL | 0 refills | Status: DC

## 2024-06-01 ENCOUNTER — Ambulatory Visit: Payer: PPO

## 2024-06-01 VITALS — Ht 63.0 in | Wt 134.0 lb

## 2024-06-01 DIAGNOSIS — Z0000 Encounter for general adult medical examination without abnormal findings: Secondary | ICD-10-CM
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
## 2024-06-01 NOTE — Patient Instructions (Signed)
 Bianca Fry , Thank you for taking time out of your busy schedule to complete your Annual Wellness Visit with me. I enjoyed our conversation and look forward to speaking with you again next year. I, as well as your care team,  appreciate your ongoing commitment to your health goals. Please review the following plan we discussed and let me know if I can assist you in the future. Your Game plan/ To Do List    Referrals: If you haven't heard from the office you've been referred to, please reach out to them at the phone provided.   Follow up Visits: We will see or speak with you next year for your Next Medicare AWV with our clinical staff-06/04/25 @ 8:50AM Have you seen your provider in the last 6 months (3 months if uncontrolled diabetes)? Yes  Clinician Recommendations:  Aim for 30 minutes of exercise or brisk walking, 6-8 glasses of water, and 5 servings of fruits and vegetables each day.       This is a list of the screenings recommended for you:  Health Maintenance  Topic Date Due   DTaP/Tdap/Td vaccine (3 - Td or Tdap) 04/08/2021   COVID-19 Vaccine (4 - 2024-25 season) 06/20/2023   Flu Shot  05/19/2024   Mammogram  10/03/2024   Medicare Annual Wellness Visit  06/01/2025   Pneumococcal Vaccine for age over 30  Completed   DEXA scan (bone density measurement)  Completed   Hepatitis C Screening  Completed   Zoster (Shingles) Vaccine  Completed   Hepatitis B Vaccine  Aged Out   HPV Vaccine  Aged Out   Meningitis B Vaccine  Aged Out   Colon Cancer Screening  Discontinued    Advanced directives: (Copy Requested) Please bring a copy of your health care power of attorney and living will to the office to be added to your chart at your convenience. You can mail to [facility] [address] [place], [place] [address] or email to [email or web address] PT SAYS SHE HAS HC-POA IN PLACE ONLY Advance Care Planning is important because it:  [x]  Makes sure you receive the medical  care that is consistent with your values, goals, and preferences  [x]  It provides guidance to your family and loved ones and reduces their decisional burden about whether or not they are making the right decisions based on your wishes.  Follow the link provided in your after visit summary or read over the paperwork we have mailed to you to help you started getting your Advance Directives in place. If you need assistance in completing these, please reach out to us  so that we can help you!

## 2024-06-15 DIAGNOSIS — M25551 Pain in right hip: Secondary | ICD-10-CM | POA: Diagnosis not present

## 2024-06-16 ENCOUNTER — Ambulatory Visit

## 2024-06-16 DIAGNOSIS — M5416 Radiculopathy, lumbar region: Secondary | ICD-10-CM | POA: Diagnosis not present

## 2024-06-16 DIAGNOSIS — R7303 Prediabetes: Secondary | ICD-10-CM | POA: Diagnosis not present

## 2024-06-26 ENCOUNTER — Other Ambulatory Visit: Payer: Self-pay | Admitting: Family Medicine

## 2024-06-26 DIAGNOSIS — F419 Anxiety disorder, unspecified: Secondary | ICD-10-CM

## 2024-06-26 NOTE — Telephone Encounter (Unsigned)
 Copied from CRM [id number]. Topic: Clinical - Medication Refill >> Jun 26, 2024 11:02 AM Franky GRADE wrote: Medication: ALPRAZolam  (XANAX ) 0.5 MG tablet [[id number]]  Has the patient contacted their pharmacy? Yes, they asked patient to reach out to her primary care provider.  (Agent: If no, request that the patient contact the pharmacy for the refill. If patient does not wish to contact the pharmacy document the reason why and proceed with request.) (Agent: If yes, when and what did the pharmacy advise?)  This is the patient's preferred pharmacy:  [facility], [place] [address], [place] - [address] [place] [address] Phone: [phone] Fax: [phone]  Is this the correct pharmacy for this prescription? Yes If no, delete pharmacy and type the correct one.   Has the prescription been filled recently? No  Is the patient out of the medication? No  Has the patient been seen for an appointment in the last year OR does the patient have an upcoming appointment? Yes  Can we respond through MyChart? Yes  Agent: Please be advised that Rx refills may take up to 3 business days. We ask that you follow-up with your pharmacy.

## 2024-06-27 NOTE — Telephone Encounter (Signed)
 Refill request for Xanax  0.5mg  40tab 0refills; last fill 7/30/2; Last OV 03/06/24

## 2024-06-28 MED ORDER — ALPRAZOLAM 0.5 MG PO TABS: ORAL_TABLET | ORAL | 0 refills | Status: DC

## 2024-06-28 NOTE — Telephone Encounter (Signed)
 ERx

## 2024-07-04 DIAGNOSIS — M5441 Lumbago with sciatica, right side: Secondary | ICD-10-CM | POA: Diagnosis not present

## 2024-07-04 DIAGNOSIS — M533 Sacrococcygeal disorders, not elsewhere classified: Secondary | ICD-10-CM | POA: Diagnosis not present

## 2024-07-04 DIAGNOSIS — G8929 Other chronic pain: Secondary | ICD-10-CM | POA: Diagnosis not present

## 2024-07-04 DIAGNOSIS — M5416 Radiculopathy, lumbar region: Secondary | ICD-10-CM | POA: Diagnosis not present

## 2024-07-04 DIAGNOSIS — M5442 Lumbago with sciatica, left side: Secondary | ICD-10-CM | POA: Diagnosis not present

## 2024-07-07 DIAGNOSIS — B0052 Herpesviral keratitis: Secondary | ICD-10-CM | POA: Diagnosis not present

## 2024-07-07 DIAGNOSIS — H1132 Conjunctival hemorrhage, left eye: Secondary | ICD-10-CM | POA: Diagnosis not present

## 2024-07-07 DIAGNOSIS — G8929 Other chronic pain: Secondary | ICD-10-CM | POA: Diagnosis not present

## 2024-07-07 DIAGNOSIS — M533 Sacrococcygeal disorders, not elsewhere classified: Secondary | ICD-10-CM | POA: Diagnosis not present

## 2024-07-07 DIAGNOSIS — R7303 Prediabetes: Secondary | ICD-10-CM | POA: Diagnosis not present

## 2024-07-11 ENCOUNTER — Ambulatory Visit (INDEPENDENT_AMBULATORY_CARE_PROVIDER_SITE_OTHER): Admitting: Obstetrics and Gynecology

## 2024-07-11 ENCOUNTER — Other Ambulatory Visit: Payer: Self-pay | Admitting: Obstetrics and Gynecology

## 2024-07-11 ENCOUNTER — Encounter: Payer: Self-pay | Admitting: Obstetrics and Gynecology

## 2024-07-11 ENCOUNTER — Encounter: Admitting: Obstetrics and Gynecology

## 2024-07-11 VITALS — BP 120/74 | HR 89 | Ht 63.75 in | Wt 127.0 lb

## 2024-07-11 DIAGNOSIS — N952 Postmenopausal atrophic vaginitis: Secondary | ICD-10-CM | POA: Diagnosis not present

## 2024-07-11 DIAGNOSIS — B009 Herpesviral infection, unspecified: Secondary | ICD-10-CM | POA: Diagnosis not present

## 2024-07-11 DIAGNOSIS — Z01419 Encounter for gynecological examination (general) (routine) without abnormal findings: Secondary | ICD-10-CM

## 2024-07-11 DIAGNOSIS — M8589 Other specified disorders of bone density and structure, multiple sites: Secondary | ICD-10-CM

## 2024-07-11 DIAGNOSIS — M533 Sacrococcygeal disorders, not elsewhere classified: Secondary | ICD-10-CM | POA: Diagnosis not present

## 2024-07-11 DIAGNOSIS — Z5181 Encounter for therapeutic drug level monitoring: Secondary | ICD-10-CM

## 2024-07-11 DIAGNOSIS — Z1231 Encounter for screening mammogram for malignant neoplasm of breast: Secondary | ICD-10-CM

## 2024-07-11 DIAGNOSIS — Z9189 Other specified personal risk factors, not elsewhere classified: Secondary | ICD-10-CM | POA: Diagnosis not present

## 2024-07-11 MED ORDER — ESTRADIOL 0.1 MG/GM VA CREA: TOPICAL_CREAM | VAGINAL | 2 refills | Status: AC

## 2024-07-11 NOTE — Patient Instructions (Signed)
 Denosumab Injection (Osteoporosis) What is this medication? DENOSUMAB (den oh SUE mab) prevents and treats osteoporosis. It works by [employer] spatial [employer] stronger and less likely to break (fracture). It is a monoclonal antibody. This medicine may be used for other purposes; ask your health care provider or pharmacist if you have questions. COMMON BRAND NAME(S): Prolia What should I tell my care team before I take this medication? They need to know if you have any of these conditions: Dental or gum disease Had thyroid or parathyroid (glands located in neck) surgery Having dental surgery or a tooth pulled Kidney disease Low levels of calcium in the blood On dialysis Poor nutrition Thyroid disease Trouble absorbing nutrients from your food An unusual or allergic reaction to denosumab, other medications, foods, dyes, or preservatives Pregnant or trying to get pregnant Breastfeeding How should I use this medication? This medication is injected under the skin. It is given by your care team in a hospital or clinic setting. A special MedGuide will be given to you before each treatment. Be sure to read this information carefully each time. Talk to your care team about the use of this medication in children. Special care may be needed. Overdosage: If you think you have taken too much of this medicine contact a poison control center or emergency room at once. NOTE: This medicine is only for you. Do not share this medicine with others. What if I miss a dose? Keep appointments for follow-up doses. It is important not to miss your dose. Call your care team if you are unable to keep an appointment. What may interact with this medication? Do not take this medication with any of the following: Other medications that contain denosumab This medication may also interact with the following: Medications that lower your chance of fighting infection Steroid medications, such as prednisone or cortisone This  list may not describe all possible interactions. Give your health care provider a list of all the medicines, herbs, non-prescription drugs, or dietary supplements you use. Also tell them if you smoke, drink alcohol, or use illegal drugs. Some items may interact with your medicine. What should I watch for while using this medication? Your condition will be monitored carefully while you are receiving this medication. You may need blood work done while taking this medication. This medication may increase your risk of getting an infection. Call your care team for advice if you get a fever, chills, sore throat, or other symptoms of a cold or flu. Do not treat yourself. Try to avoid being around people who are sick. Tell your dentist and dental surgeon that you are taking this medication. You should not have major dental surgery while on this medication. See your dentist to have a dental exam and fix any dental problems before starting this medication. Take good care of your teeth while on this medication. Make sure you see your dentist for regular follow-up appointments. This medication may cause low levels of calcium in your body. The risk of severe side effects is increased in people with kidney disease. Your care team may prescribe calcium and vitamin D  to help prevent low calcium levels while you take this medication. It is important to take calcium and vitamin D  as directed by your care team. Talk to your care team if you may be pregnant. Serious birth defects may occur if you take this medication during pregnancy and for 5 months after the last dose. You will need a negative pregnancy test before starting this medication. Contraception  is recommended while taking this medication and for 5 months after the last dose. Your care team can help you find the option that works for you. Talk to your care team before breastfeeding. Changes to your treatment plan may be needed. What side effects may I notice from  receiving this medication? Side effects that you should report to your care team as soon as possible: Allergic reactions--skin rash, itching, hives, swelling of the face, lips, tongue, or throat Infection--fever, chills, cough, sore throat, wounds that don't heal, pain or trouble when passing urine, general feeling of discomfort or being unwell Low calcium level--muscle pain or cramps, confusion, tingling, or numbness in the hands or feet Osteonecrosis of the jaw--pain, swelling, or redness in the mouth, numbness of the jaw, poor healing after dental work, unusual discharge from the mouth, visible bones in the mouth Severe bone, joint, or muscle pain Skin infection--skin redness, swelling, warmth, or pain Side effects that usually do not require medical attention (report these to your care team if they continue or are bothersome): Back pain Headache Joint pain Muscle pain Pain in the hands, arms, legs, or feet Runny or stuffy nose Sore throat This list may not describe all possible side effects. Call your doctor for medical advice about side effects. You may report side effects to FDA at 1-800-FDA-1088. Where should I keep my medication? This medication is given in a hospital or clinic. It will not be stored at home. NOTE: This sheet is a summary. It may not cover all possible information. If you have questions about this medicine, talk to your doctor, pharmacist, or health care provider.  2024 [employer]/Gold Standard (2022-11-10 00:00:00)  Zoledronic Acid Injection (Bone Disorders) What is this medication? ZOLEDRONIC ACID (ZOE le dron ik AS id) prevents and treats osteoporosis. It may also be used to treat Paget's disease of the bone. It works by [employer] spatial designer stronger and less likely to break (fracture). It belongs to a group of medications called bisphosphonates. This medicine may be used for other purposes; ask your health care provider or pharmacist if you have questions. COMMON  BRAND NAME(S): Reclast What should I tell my care team before I take this medication? They need to know if you have any of these conditions: Bleeding disorder Cancer Dental disease Kidney disease Low levels of calcium in the blood Low red blood cell counts Lung or breathing disease, such as asthma Receiving steroids, such as dexamethasone or prednisone An unusual or allergic reaction to zoledronic acid, other medications, foods, dyes, or preservatives Pregnant or trying to get pregnant Breast-feeding How should I use this medication? This medication is injected into a vein. It is given by your care team in a hospital or clinic setting. A special MedGuide will be given to you before each treatment. Be sure to read this information carefully each time. Talk to your care team about the use of this medication in children. Special care may be needed. Overdosage: If you think you have taken too much of this medicine contact a poison control center or emergency room at once. NOTE: This medicine is only for you. Do not share this medicine with others. What if I miss a dose? Keep appointments for follow-up doses. It is important not to miss your dose. Call your care team if you are unable to keep an appointment. What may interact with this medication? Certain antibiotics given by injection Medications for pain and inflammation, such as ibuprofen, naproxen, NSAIDs Some diuretics, such as bumetanide, furosemide  Teriparatide This list may not describe all possible interactions. Give your health care provider a list of all the medicines, herbs, non-prescription drugs, or dietary supplements you use. Also tell them if you smoke, drink alcohol, or use illegal drugs. Some items may interact with your medicine. What should I watch for while using this medication? Visit your care team for regular checks on your progress. It may be some time before you see the benefit from this medication. Some people who  take this medication have severe bone, joint, or muscle pain. This medication may also increase your risk for jaw problems or a broken thigh bone. Tell your care team right away if you have severe pain in your jaw, bones, joints, or muscles. Tell your care team if you have any pain that does not go away or that gets worse. You should make sure you get enough calcium and vitamin D  while you are taking this medication. Discuss the foods you eat and the vitamins you take with your care team. You may need bloodwork while taking this medication. Tell your dentist and dental surgeon that you are taking this medication. You should not have major dental surgery while on this medication. See your dentist to have a dental exam and fix any dental problems before starting this medication. Take good care of your teeth while on this medication. Make sure you see your dentist for regular follow-up appointments. What side effects may I notice from receiving this medication? Side effects that you should report to your care team as soon as possible: Allergic reactions--skin rash, itching, hives, swelling of the face, lips, tongue, or throat Kidney injury--decrease in the amount of urine, swelling of the ankles, hands, or feet Low calcium level--muscle pain or cramps, confusion, tingling, or numbness in the hands or feet Osteonecrosis of the jaw--pain, swelling, or redness in the mouth, numbness of the jaw, poor healing after dental work, unusual discharge from the mouth, visible bones in the mouth Severe bone, joint, or muscle pain Side effects that usually do not require medical attention (report to your care team if they continue or are bothersome): Diarrhea Dizziness Headache Nausea Stomach pain Vomiting This list may not describe all possible side effects. Call your doctor for medical advice about side effects. You may report side effects to FDA at 1-800-FDA-1088. Where should I keep my medication? This  medication is given in a hospital or clinic. It will not be stored at home. NOTE: This sheet is a summary. It may not cover all possible information. If you have questions about this medicine, talk to your doctor, pharmacist, or health care provider.  2024 [employer]/Gold Standard (2021-11-21 00:00:00)  EXERCISE AND DIET:  We recommended that you start or continue a regular exercise program for good health. Regular exercise means any activity that makes your heart beat faster and makes you sweat.  We recommend exercising at least 30 minutes per day at least 3 days a week, preferably 4 or 5.  We also recommend a diet low in fat and sugar.  Inactivity, poor dietary choices and obesity can cause diabetes, heart attack, stroke, and kidney damage, among others.    ALCOHOL AND SMOKING:  Women should limit their alcohol intake to no more than 7 drinks/beers/glasses of wine (combined, not each!) per week. Moderation of alcohol intake to this level decreases your risk of breast cancer and liver damage. And of course, no recreational drugs are part of a healthy lifestyle.  And absolutely no smoking or even  second hand smoke. Most people know smoking can cause heart and lung diseases, but did you know it also contributes to weakening of your bones? Aging of your skin?  Yellowing of your teeth and nails?  CALCIUM AND VITAMIN D :  Adequate intake of calcium and Vitamin D  are recommended.  The recommendations for exact amounts of these supplements seem to change often, but generally speaking 600 mg of calcium (either carbonate or citrate) and 800 units of Vitamin D  per day seems prudent. Certain women may benefit from higher intake of Vitamin D .  If you are among these women, your doctor will have told you during your visit.    PAP SMEARS:  Pap smears, to check for cervical cancer or precancers,  have traditionally been done yearly, although recent scientific advances have shown that most women can have pap smears less  often.  However, every woman still should have a physical exam from her gynecologist every year. It will include a breast check, inspection of the vulva and vagina to check for abnormal growths or skin changes, a visual exam of the cervix, and then an exam to evaluate the size and shape of the uterus and ovaries.  And after 79 years of age, a rectal exam is indicated to check for rectal cancers. We will also provide age appropriate advice regarding health maintenance, like when you should have certain vaccines, screening for sexually transmitted diseases, bone density testing, colonoscopy, mammograms, etc.   MAMMOGRAMS:  All women over 35 years old should have a yearly mammogram. Many facilities now offer a "3D" mammogram, which may cost around $50 extra out of pocket. If possible,  we recommend you accept the option to have the 3D mammogram performed.  It both reduces the number of women who will be called back for extra views which then turn out to be normal, and it is better than the routine mammogram at detecting truly abnormal areas.    COLONOSCOPY:  Colonoscopy to screen for colon cancer is recommended for all women at age 25.  We know, you hate the idea of the prep.  We agree, BUT, having colon cancer and not knowing it is worse!!  Colon cancer so often starts as a polyp that can be seen and removed at colonscopy, which can quite literally save your life!  And if your first colonoscopy is normal and you have no family history of colon cancer, most women don't have to have it again for 10 years.  Once every ten years, you can do something that may end up saving your life, right?  We will be happy to help you get it scheduled when you are ready.  Be sure to check your insurance coverage so you understand how much it will cost.  It may be covered as a preventative service at no cost, but you should check your particular policy.
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
## 2024-07-17 ENCOUNTER — Other Ambulatory Visit: Payer: Self-pay | Admitting: Family Medicine

## 2024-07-17 DIAGNOSIS — R112 Nausea with vomiting, unspecified: Secondary | ICD-10-CM

## 2024-07-19 DIAGNOSIS — M5442 Lumbago with sciatica, left side: Secondary | ICD-10-CM | POA: Diagnosis not present

## 2024-07-19 DIAGNOSIS — M5441 Lumbago with sciatica, right side: Secondary | ICD-10-CM | POA: Diagnosis not present

## 2024-07-19 DIAGNOSIS — H16141 Punctate keratitis, right eye: Secondary | ICD-10-CM | POA: Diagnosis not present

## 2024-07-19 DIAGNOSIS — H02401 Unspecified ptosis of right eyelid: Secondary | ICD-10-CM | POA: Diagnosis not present

## 2024-07-19 DIAGNOSIS — H04123 Dry eye syndrome of bilateral lacrimal glands: Secondary | ICD-10-CM | POA: Diagnosis not present

## 2024-07-19 DIAGNOSIS — H01021 Squamous blepharitis right upper eyelid: Secondary | ICD-10-CM | POA: Diagnosis not present

## 2024-07-19 DIAGNOSIS — H01024 Squamous blepharitis left upper eyelid: Secondary | ICD-10-CM | POA: Diagnosis not present

## 2024-07-19 DIAGNOSIS — M5416 Radiculopathy, lumbar region: Secondary | ICD-10-CM | POA: Diagnosis not present

## 2024-07-19 DIAGNOSIS — H02051 Trichiasis without entropian right upper eyelid: Secondary | ICD-10-CM | POA: Diagnosis not present

## 2024-07-19 DIAGNOSIS — Z961 Presence of intraocular lens: Secondary | ICD-10-CM | POA: Diagnosis not present

## 2024-07-19 DIAGNOSIS — M533 Sacrococcygeal disorders, not elsewhere classified: Secondary | ICD-10-CM | POA: Diagnosis not present

## 2024-07-19 DIAGNOSIS — H01023 Squamous blepharitis right eye, unspecified eyelid: Secondary | ICD-10-CM | POA: Diagnosis not present

## 2024-07-19 DIAGNOSIS — G8929 Other chronic pain: Secondary | ICD-10-CM | POA: Diagnosis not present

## 2024-07-19 NOTE — Telephone Encounter (Signed)
 Zofran  Last filled:  05/30/24, #20 Last OV:  02/16/24, 3 mo f/u; saw Dr Randeen 03/06/24 Next OV:  08/22/24 CPE

## 2024-07-25 ENCOUNTER — Emergency Department

## 2024-07-25 ENCOUNTER — Other Ambulatory Visit: Payer: Self-pay

## 2024-07-25 ENCOUNTER — Emergency Department
Admission: EM | Admit: 2024-07-25 | Discharge: 2024-07-25 | Disposition: A | Discharge status: Home / Self Care | Attending: Emergency Medicine | Admitting: Emergency Medicine

## 2024-07-25 DIAGNOSIS — I10 Essential (primary) hypertension: Secondary | ICD-10-CM | POA: Insufficient documentation

## 2024-07-25 DIAGNOSIS — R079 Chest pain, unspecified: Secondary | ICD-10-CM | POA: Diagnosis not present

## 2024-07-25 DIAGNOSIS — R0789 Other chest pain: Secondary | ICD-10-CM | POA: Insufficient documentation

## 2024-07-25 DIAGNOSIS — R002 Palpitations: Secondary | ICD-10-CM | POA: Diagnosis not present

## 2024-07-25 DIAGNOSIS — R6884 Jaw pain: Secondary | ICD-10-CM | POA: Diagnosis not present

## 2024-07-25 LAB — CBC
HCT: 39.7 % (ref 36.0–46.0)
Hemoglobin: 12.8 g/dL (ref 12.0–15.0)
MCH: 29.8 pg (ref 26.0–34.0)
MCHC: 32.2 g/dL (ref 30.0–36.0)
MCV: 92.3 fL (ref 80.0–100.0)
Platelets: 170 K/uL (ref 150–400)
RBC: 4.3 MIL/uL (ref 3.87–5.11)
RDW: 13.2 % (ref 11.5–15.5)
WBC: 9.4 K/uL (ref 4.0–10.5)
nRBC: 0 % (ref 0.0–0.2)

## 2024-07-25 LAB — BASIC METABOLIC PANEL WITH GFR
Anion gap: 10 (ref 5–15)
BUN: 20 mg/dL (ref 8–23)
CO2: 24 mmol/L (ref 22–32)
Calcium: 10.3 mg/dL (ref 8.9–10.3)
Chloride: 103 mmol/L (ref 98–111)
Creatinine, Ser: 0.81 mg/dL (ref 0.44–1.00)
GFR, Estimated: >60 mL/min (ref >60)
Glucose, Bld: 133 mg/dL — ABNORMAL HIGH (ref 70–99)
Potassium: 3.7 mmol/L (ref 3.5–5.1)
Sodium: 137 mmol/L (ref 135–145)

## 2024-07-25 LAB — TROPONIN I (HIGH SENSITIVITY)
Troponin I (High Sensitivity): 6 ng/L (ref <18)
Troponin I (High Sensitivity): 10 ng/L (ref <18)

## 2024-07-25 NOTE — ED Provider Notes (Signed)
 [facility] Provider Note    Event Date/Time   First MD Initiated Contact with Patient 07/25/24 0430     (approximate)   History   Chief Complaint Chest Pain   HPI  Bianca Fry is a 78 y.o. female with past medical history of hypertension, hyperlipidemia, GERD, and anxiety who presents to the ED complaining of chest pain.  Patient reports that she got up to go to the bathroom early this morning, walked a few steps and felt like she had run a marathon.  She reports feeling out of breath with discomfort in her chest that felt like a dull ache.  She took an aspirin  as well as Prilosec, now states that symptoms have resolved since she has arrived to the ED.  She was feeling fine at the time she went to bed, denies any recent fevers, cough, leg swelling or pain.     Physical Exam   Triage Vital Signs: ED Triage Vitals  Encounter Vitals Group     BP 07/25/24 0347 135/79     Girls Systolic BP Percentile --      Girls Diastolic BP Percentile --      Boys Systolic BP Percentile --      Boys Diastolic BP Percentile --      Pulse Rate 07/25/24 0347 74     Resp 07/25/24 0347 16     Temp 07/25/24 0347 98.3 F (36.8 C)     Temp Source 07/25/24 0347 Oral     SpO2 07/25/24 0347 99 %     Weight 07/25/24 0344 125 lb (56.7 kg)     Height 07/25/24 0344 5' 3.75 (1.619 m)     Head Circumference --      Peak Flow --      Pain Score 07/25/24 0344 2     Pain Loc --      Pain Education --      Exclude from Growth Chart --     Most recent vital signs: Vitals:   07/25/24 0347 07/25/24 0518  BP: 135/79   Pulse: 74   Resp: 16   Temp: 98.3 F (36.8 C)   SpO2: 99% 99%    Constitutional: Alert and oriented. Eyes: Conjunctivae are normal. Head: Atraumatic. Nose: No congestion/rhinnorhea. Mouth/Throat: Mucous membranes are moist.  Cardiovascular: Normal rate, regular rhythm. Grossly normal heart sounds.  2+ radial pulses bilaterally. Respiratory: Normal  respiratory effort.  No retractions. Lungs CTAB. Gastrointestinal: Soft and nontender. No distention. Musculoskeletal: No lower extremity tenderness nor edema.  Neurologic:  Normal speech and language. No gross focal neurologic deficits are appreciated.    ED Results / Procedures / Treatments   Labs (all labs ordered are listed, but only abnormal results are displayed) Labs Reviewed  BASIC METABOLIC PANEL WITH GFR - Abnormal; Notable for the following components:      Result Value   Glucose, Bld 133 (*)    All other components within normal limits  CBC  TROPONIN I (HIGH SENSITIVITY)  TROPONIN I (HIGH SENSITIVITY)     EKG  ED ECG REPORT I, Carlin Palin, the attending physician, personally viewed and interpreted this ECG.   Date: 07/25/2024  EKG Time: 3:42  Rate: 76  Rhythm: normal sinus rhythm  Axis: Normal  Intervals:none  ST&T Change: None  RADIOLOGY Chest x-ray reviewed and interpreted by me with no infiltrate, edema, or effusion.  PROCEDURES:  Critical Care performed: No  Procedures   MEDICATIONS ORDERED IN ED: Medications -  No data to display   IMPRESSION / MDM / ASSESSMENT AND PLAN / ED COURSE  I reviewed the triage vital signs and the nursing notes.                              78 y.o. female with past medical history of hypertension, hyperlipidemia, GERD, and anxiety who presents to the ED complaining of chest discomfort and difficulty breathing while walking to the bathroom this morning.  Patient's presentation is most consistent with acute presentation with potential threat to life or bodily function.  Differential diagnosis includes, but is not limited to, ACS, arrhythmia, PE, pneumonia, pneumothorax, musculoskeletal pain, GERD, anxiety.  Patient nontoxic-appearing and in no acute distress, vital signs are unremarkable.  EKG shows no evidence of arrhythmia or ischemia, initial troponin within normal limits.  With resolution of her symptoms and  reassuring vital signs, doubt PE or dissection.  Additional labs are reassuring without significant anemia, leukocytosis, electrolyte abnormality, or AKI.  We will observe on cardiac monitor and check second set troponin, but if this is unremarkable then she would be appropriate for discharge home with outpatient follow-up.  Repeat troponin is stable, patient remains asymptomatic on reassessment.  She is appropriate for discharge home with outpatient follow-up, was counseled to return to the ED for new or worsening symptoms, patient agrees with plan.      FINAL CLINICAL IMPRESSION(S) / ED DIAGNOSES   Final diagnoses:  Nonspecific chest pain     Rx / DC Orders   ED Discharge Orders     None        Note:  This document was prepared using Dragon voice recognition software and may include unintentional dictation errors.   [name], MD 07/25/24 [id number]

## 2024-07-25 NOTE — ED Triage Notes (Signed)
 Patient ambulatory to triage with complaints of chest pain, palpitations, and pain radiating to jaw. Patient states it began when she got up to go to the bathroom around 2am. Took aspirin  and prilosec initially thinking she was having some indigestion but pain did not subside so she came in.

## 2024-07-31 ENCOUNTER — Other Ambulatory Visit: Payer: Self-pay | Admitting: Family Medicine

## 2024-07-31 DIAGNOSIS — I10 Essential (primary) hypertension: Secondary | ICD-10-CM

## 2024-07-31 MED ORDER — LOSARTAN POTASSIUM 50 MG PO TABS: 75.0000 mg | ORAL_TABLET | Freq: Every day | ORAL | 3 refills | Status: DC

## 2024-07-31 NOTE — Telephone Encounter (Signed)
 ERx

## 2024-07-31 NOTE — Telephone Encounter (Signed)
 Copied from CRM [id number]. Topic: Clinical - Medication Refill >> Jul 31, 2024  9:49 AM Corin V wrote: Medication: losartan  (COZAAR ) 50 MG tablet  Has the patient contacted their pharmacy? Yes (Agent: If no, request that the patient contact the pharmacy for the refill. If patient does not wish to contact the pharmacy document the reason why and proceed with request.) (Agent: If yes, when and what did the pharmacy advise?)  This is the patient's preferred pharmacy:  [facility] [address] [place] - [address] [place] [address] Phone: [phone] Fax: [phone]  Is this the correct pharmacy for this prescription? Yes If no, delete pharmacy and type the correct one.   Has the prescription been filled recently? No  Is the patient out of the medication? No  Has the patient been seen for an appointment in the last year OR does the patient have an upcoming appointment? Yes  Can we respond through MyChart? Yes  Agent: Please be advised that Rx refills may take up to 3 business days. We ask that you follow-up with your pharmacy.

## 2024-08-08 ENCOUNTER — Encounter: Payer: Self-pay | Admitting: Family Medicine

## 2024-08-08 ENCOUNTER — Ambulatory Visit (INDEPENDENT_AMBULATORY_CARE_PROVIDER_SITE_OTHER)
Admission: RE | Admit: 2024-08-08 | Discharge: 2024-08-08 | Disposition: A | Discharge status: Home / Self Care | Source: Ambulatory Visit | Attending: Family Medicine | Admitting: Family Medicine

## 2024-08-08 ENCOUNTER — Ambulatory Visit (INDEPENDENT_AMBULATORY_CARE_PROVIDER_SITE_OTHER): Admitting: Family Medicine

## 2024-08-08 VITALS — BP 114/60 | HR 76 | Temp 97.8°F | Ht 63.75 in | Wt 125.4 lb

## 2024-08-08 DIAGNOSIS — F419 Anxiety disorder, unspecified: Secondary | ICD-10-CM | POA: Diagnosis not present

## 2024-08-08 DIAGNOSIS — M533 Sacrococcygeal disorders, not elsewhere classified: Secondary | ICD-10-CM

## 2024-08-08 DIAGNOSIS — M5416 Radiculopathy, lumbar region: Secondary | ICD-10-CM

## 2024-08-08 DIAGNOSIS — R0789 Other chest pain: Secondary | ICD-10-CM | POA: Diagnosis not present

## 2024-08-08 MED ORDER — ALPRAZOLAM 0.5 MG PO TABS: ORAL_TABLET | ORAL | 0 refills | Status: DC

## 2024-08-08 NOTE — Progress Notes (Unsigned)
 Ph: [phone] Fax: [phone]   Patient ID: Bianca Fry, female    DOB: Jan 17, 1946, 78 y.o.   MRN: [id number]  This visit was conducted in person.  BP 114/60   Pulse 76   Temp 97.8 F (36.6 C) (Oral)   Ht 5' 3.75 (1.619 m)   Wt 125 lb 6 oz (56.9 kg)   LMP 10/20/1979 (Within Years)   SpO2 96%   BMI 21.69 kg/m    CC: ER f/u visit  Subjective:   HPI: Bianca Fry is a 78 y.o. female presenting on 08/08/2024 for Follow-up (Pt CC tailbone pain. Been going on for 3 years./Pt states she has been using a doughnut pillow/)   Recent ER visit on 07/25/2024 for episode of palpitations, associated with dull chest pain and dyspnea that suddenly started after waking up and walking to the bathroom. She self treated with xanax , prilosec, and aspirin .  ER records reviewed. EKG, CXR, labwork overall reassuring including troponins x2.  Notes significantly increased stress the past few months - multiple ER visits, recent hip surgery with difficult recovery, husband's health issues as well.  Wonders if this was brought on by panic attack.  No prior or recurrent palpitations since then.  Requests xanax  refilled  Has been seeing [facility] PM&R Dr Dodson for chronic low back pain with R lumbar radiculitis s/p B L5/S1 TFESI 05/2024 with 50% improvement (actually left 3rd/4th toe pain/numbness improved, low back pain improved after SI joint pain). H/o L4/5 fusion 2009.  Planned rpt TFESI at R L3/4 and S1 scheduled for 10/23.   Ongoing coccyx pain for the past 3 years acutely worse after hip replacement surgery 11/2023 (prolonged sitting while recovering) - painful to sit unless she sits on donut pillow. Takes tylenol  with mild temporary relief.  Dry skin overlying area - has seen GYN as well as dermatology.  Aquaphor use helped improve tailbone pain.  Evaluated last year for this with coccyx xray followed by pelvic CT without contrast.   She has not been taking gabapentin . Doesn't feel it was  too helpful.   PFPT previously for chronic cystitis didn't really help.      Relevant past medical, surgical, family and social history reviewed and updated as indicated. Interim medical history since our last visit reviewed. Allergies and medications reviewed and updated. Outpatient Medications Prior to Visit  Medication Sig Dispense Refill   acidophilus (RISAQUAD) CAPS capsule Take 1 capsule by mouth daily. 90 capsule 1   aspirin  EC 81 MG tablet Take 1 tablet (81 mg total) by mouth in the morning and at bedtime. Swallow whole.     atorvastatin  (LIPITOR) 10 MG tablet TAKE ONE TABLET BY MOUTH ONE TIME DAILY 90 tablet 3   Calcium  Carb-Cholecalciferol  (CALCIUM -VITAMIN D ) 600-400 MG-UNIT TABS Take 1 tablet by mouth daily.     carvedilol  (COREG ) 6.25 MG tablet TAKE ONE TABLET BY MOUTH TWICE A DAY WITH FOOD 180 tablet 3   cephALEXin  (KEFLEX ) 250 MG capsule Take 250 mg by mouth daily.     cholecalciferol  (VITAMIN D ) 25 MCG (1000 UNIT) tablet Take 1,000 Units by mouth daily.     CLINDAMYCIN  HCL PO Take by mouth. TAKES PRIOR TO DENTAL PROCEDURE     dicyclomine  (BENTYL ) 10 MG capsule Take 1 capsule (10 mg total) by mouth 2 (two) times daily as needed for spasms. 90 capsule 1   esomeprazole  (NEXIUM ) 40 MG capsule Take 1 capsule (40 mg total) by mouth daily.     estradiol  (  ESTRACE ) 0.1 MG/GM vaginal cream Use 1/2 g vaginally two or three times per week as needed to maintain symptom relief. 42.5 g 2   fexofenadine (ALLEGRA) 180 MG tablet Take 180 mg by mouth daily as needed for allergies.     linaclotide (LINZESS) 145 MCG CAPS capsule Take 145 mcg by mouth.     losartan  (COZAAR ) 50 MG tablet Take 1.5 tablets (75 mg total) by mouth daily. 135 tablet 3   methocarbamol  (ROBAXIN ) 500 MG tablet TAKE ONE TABLET BY MOUTH THREE TIMES A DAY AS NEEDED FOR MUSCLE SPASMS (SEDATION PRECAUTIONS) 30 tablet 0   ondansetron  (ZOFRAN ) 4 MG tablet TAKE 1 TABLET BY MOUTH EVERY 8 HOURS AS NEEDED FOR NAUSEA AND VOMITING 20  tablet 0   polyethylene glycol powder (GLYCOLAX /MIRALAX ) 17 GM/SCOOP powder Take 17 g by mouth 2 (two) times daily as needed for moderate constipation (hold for diarrhea).     senna (SENOKOT) 8.6 MG tablet Take 1 tablet (8.6 mg total) by mouth daily. (Patient not taking: Reported on 07/11/2024)     sucralfate  (CARAFATE ) 1 g tablet Take 1 tablet (1 g total) by mouth 3 (three) times daily with meals as needed (reflux). 60 tablet 0   valACYclovir  (VALTREX ) 500 MG tablet Take one tablet (500 mg) by mouth twice a day for 3 days as needed for a genital outbreak.  Take 4 tablets (2000 mg) by mouth twice a day for 1 day as needed for an oral outbreak. 30 tablet 1   ALPRAZolam  (XANAX ) 0.5 MG tablet TAKE ONE TABLET BY MOUTH TWICE A DAY AS NEEDED FOR ANXIETY 40 tablet 0   gabapentin  (NEURONTIN ) 300 MG capsule Take 1 capsule (300 mg total) by mouth at bedtime. 90 capsule 1   gatifloxacin (ZYMAXID) 0.5 % SOLN INSTILL 1 DROP INTO LEFT EYE 4 TIMES DAILY FOR 7 DAYS (Patient not taking: Reported on 08/08/2024)     No facility-administered medications prior to visit.     Per HPI unless specifically indicated in ROS section below Review of Systems  Objective:  BP 114/60   Pulse 76   Temp 97.8 F (36.6 C) (Oral)   Ht 5' 3.75 (1.619 m)   Wt 125 lb 6 oz (56.9 kg)   LMP 10/20/1979 (Within Years)   SpO2 96%   BMI 21.69 kg/m   Wt Readings from Last 3 Encounters:  08/08/24 125 lb 6 oz (56.9 kg)  07/25/24 125 lb (56.7 kg)  07/11/24 127 lb (57.6 kg)      Physical Exam    Results for orders placed or performed during the hospital encounter of 07/25/24  Basic metabolic panel   Collection Time: 07/25/24  3:48 AM  Result Value Ref Range   Sodium 137 135 - 145 mmol/L   Potassium 3.7 3.5 - 5.1 mmol/L   Chloride 103 98 - 111 mmol/L   CO2 24 22 - 32 mmol/L   Glucose, Bld 133 (H) 70 - 99 mg/dL   BUN 20 8 - 23 mg/dL   Creatinine, Ser 9.18 0.44 - 1.00 mg/dL   Calcium  10.3 8.9 - 10.3 mg/dL   GFR, Estimated >39  >39 mL/min   Anion gap 10 5 - 15  CBC   Collection Time: 07/25/24  3:48 AM  Result Value Ref Range   WBC 9.4 4.0 - 10.5 K/uL   RBC 4.30 3.87 - 5.11 MIL/uL   Hemoglobin 12.8 12.0 - 15.0 g/dL   HCT 60.2 63.9 - 53.9 %   MCV 92.3 80.0 -  100.0 fL   MCH 29.8 26.0 - 34.0 pg   MCHC 32.2 30.0 - 36.0 g/dL   RDW 86.7 88.4 - 84.4 %   Platelets 170 150 - 400 K/uL   nRBC 0.0 0.0 - 0.2 %  Troponin I (High Sensitivity)   Collection Time: 07/25/24  3:48 AM  Result Value Ref Range   Troponin I (High Sensitivity) 6 <18 ng/L  Troponin I (High Sensitivity)   Collection Time: 07/25/24  5:42 AM  Result Value Ref Range   Troponin I (High Sensitivity) 10 <18 ng/L    Assessment & Plan:   Problem List Items Addressed This Visit     Anxiety   Relevant Medications   ALPRAZolam  (XANAX ) 0.5 MG tablet     Meds ordered this encounter  Medications   ALPRAZolam  (XANAX ) 0.5 MG tablet    Sig: TAKE ONE TABLET BY MOUTH TWICE A DAY AS NEEDED FOR ANXIETY    Dispense:  40 tablet    Refill:  0    No orders of the defined types were placed in this encounter.   There are no Patient Instructions on file for this visit.  Follow up plan: No follow-ups on file.  Anton Blas, MD

## 2024-08-08 NOTE — Patient Instructions (Addendum)
 Continue donut cushion use regularly Repeat coccyx xray today  May refer to pelvic floor physical therapy pending xray results.  Let us  know if recurrent palpitations or chest discomfort.

## 2024-08-09 NOTE — Assessment & Plan Note (Addendum)
 Chronic coccydynia since ~2021, it had improved but again recurred/progressively worsening since R hip replacement surgery 11/2023.  Prior workup included coccyx films 2024, followed noncontrasted pelvic CT 04/2023 which was reassuringly normal.  Continue regular donut cushion use.  Consider pelvic floor PT vs ortho eval.  Will update coccyx films today, encouraged touch base with PM&R about chronic coccydynia pain as well.

## 2024-08-09 NOTE — Assessment & Plan Note (Signed)
 Appreciate PM&R care Jennelle) s/p recent TFESI 05/2024, planned repeat TFESI (to R L3/4 and S1) later this week.

## 2024-08-09 NOTE — Assessment & Plan Note (Signed)
 Xanax refilled.

## 2024-08-09 NOTE — Assessment & Plan Note (Signed)
 Improved after L5/S1 TFESI 05/2024

## 2024-08-09 NOTE — Assessment & Plan Note (Signed)
 Recent episode of chest discomfort associated with palpitations and shortness of breath s/p overall reassuring ER evaluation including CXR, EKG, and normal TnI x2. She thinks anxiety may have driven symptoms.

## 2024-08-10 DIAGNOSIS — R7303 Prediabetes: Secondary | ICD-10-CM | POA: Diagnosis not present

## 2024-08-10 DIAGNOSIS — M5416 Radiculopathy, lumbar region: Secondary | ICD-10-CM | POA: Diagnosis not present

## 2024-08-14 ENCOUNTER — Ambulatory Visit: Payer: Self-pay | Admitting: Family Medicine

## 2024-08-14 ENCOUNTER — Other Ambulatory Visit: Payer: Self-pay | Admitting: Family Medicine

## 2024-08-14 DIAGNOSIS — D696 Thrombocytopenia, unspecified: Secondary | ICD-10-CM

## 2024-08-14 DIAGNOSIS — E785 Hyperlipidemia, unspecified: Secondary | ICD-10-CM

## 2024-08-14 DIAGNOSIS — M85832 Other specified disorders of bone density and structure, left forearm: Secondary | ICD-10-CM

## 2024-08-14 DIAGNOSIS — M533 Sacrococcygeal disorders, not elsewhere classified: Secondary | ICD-10-CM

## 2024-08-14 DIAGNOSIS — E538 Deficiency of other specified B group vitamins: Secondary | ICD-10-CM

## 2024-08-14 DIAGNOSIS — D509 Iron deficiency anemia, unspecified: Secondary | ICD-10-CM

## 2024-08-14 DIAGNOSIS — R7303 Prediabetes: Secondary | ICD-10-CM

## 2024-08-14 DIAGNOSIS — E559 Vitamin D deficiency, unspecified: Secondary | ICD-10-CM

## 2024-08-15 ENCOUNTER — Other Ambulatory Visit

## 2024-08-15 DIAGNOSIS — M533 Sacrococcygeal disorders, not elsewhere classified: Secondary | ICD-10-CM

## 2024-08-15 DIAGNOSIS — M85832 Other specified disorders of bone density and structure, left forearm: Secondary | ICD-10-CM | POA: Diagnosis not present

## 2024-08-15 DIAGNOSIS — D509 Iron deficiency anemia, unspecified: Secondary | ICD-10-CM | POA: Diagnosis not present

## 2024-08-15 DIAGNOSIS — E538 Deficiency of other specified B group vitamins: Secondary | ICD-10-CM | POA: Diagnosis not present

## 2024-08-15 DIAGNOSIS — E785 Hyperlipidemia, unspecified: Secondary | ICD-10-CM

## 2024-08-15 DIAGNOSIS — D696 Thrombocytopenia, unspecified: Secondary | ICD-10-CM

## 2024-08-15 DIAGNOSIS — R7303 Prediabetes: Secondary | ICD-10-CM | POA: Diagnosis not present

## 2024-08-15 LAB — CBC WITH DIFFERENTIAL/PLATELET
Basophils Absolute: 0 K/uL (ref 0.0–0.1)
Basophils Relative: 0.3 % (ref 0.0–3.0)
Eosinophils Absolute: 0.1 K/uL (ref 0.0–0.7)
Eosinophils Relative: 1 % (ref 0.0–5.0)
HCT: 36.9 % (ref 36.0–46.0)
Hemoglobin: 12.4 g/dL (ref 12.0–15.0)
Lymphocytes Relative: 41.4 % (ref 12.0–46.0)
Lymphs Abs: 3.3 K/uL (ref 0.7–4.0)
MCHC: 33.6 g/dL (ref 30.0–36.0)
MCV: 90.9 fl (ref 78.0–100.0)
Monocytes Absolute: 0.7 K/uL (ref 0.1–1.0)
Monocytes Relative: 8.2 % (ref 3.0–12.0)
Neutro Abs: 4 K/uL (ref 1.4–7.7)
Neutrophils Relative %: 49.1 % (ref 43.0–77.0)
Platelets: 196 K/uL (ref 150.0–400.0)
RBC: 4.06 Mil/uL (ref 3.87–5.11)
RDW: 14.3 % (ref 11.5–15.5)
WBC: 8.1 K/uL (ref 4.0–10.5)

## 2024-08-15 LAB — SEDIMENTATION RATE: Sed Rate: 28 mm/h (ref 0–30)

## 2024-08-15 LAB — COMPREHENSIVE METABOLIC PANEL WITH GFR
ALT: 14 U/L (ref 0–35)
AST: 11 U/L (ref 0–37)
Albumin: 4 g/dL (ref 3.5–5.2)
Alkaline Phosphatase: 71 U/L (ref 39–117)
BUN: 14 mg/dL (ref 6–23)
CO2: 29 meq/L (ref 19–32)
Calcium: 10.6 mg/dL — ABNORMAL HIGH (ref 8.4–10.5)
Chloride: 102 meq/L (ref 96–112)
Creatinine, Ser: 0.83 mg/dL (ref 0.40–1.20)
GFR: 66.94 mL/min (ref >60.00)
Glucose, Bld: 109 mg/dL — ABNORMAL HIGH (ref 70–99)
Potassium: 4.1 meq/L (ref 3.5–5.1)
Sodium: 138 meq/L (ref 135–145)
Total Bilirubin: 0.4 mg/dL (ref 0.2–1.2)
Total Protein: 6.3 g/dL (ref 6.0–8.3)

## 2024-08-15 LAB — TSH: TSH: 2.64 u[IU]/mL (ref 0.35–5.50)

## 2024-08-15 LAB — VITAMIN B12: Vitamin B-12: 367 pg/mL (ref 211–911)

## 2024-08-15 LAB — FERRITIN: Ferritin: 291.5 ng/mL — ABNORMAL HIGH (ref 10.0–291.0)

## 2024-08-15 LAB — LIPID PANEL
Cholesterol: 150 mg/dL (ref 0–200)
HDL: 54.4 mg/dL (ref >39.00)
LDL Cholesterol: 65 mg/dL (ref 0–99)
NonHDL: 95.44
Total CHOL/HDL Ratio: 3
Triglycerides: 151 mg/dL — ABNORMAL HIGH (ref 0.0–149.0)
VLDL: 30.2 mg/dL (ref 0.0–40.0)

## 2024-08-15 LAB — IBC PANEL
Iron: 87 ug/dL (ref 42–145)
Saturation Ratios: 32.2 % (ref 20.0–50.0)
TIBC: 270.2 ug/dL (ref 250.0–450.0)
Transferrin: 193 mg/dL — ABNORMAL LOW (ref 212.0–360.0)

## 2024-08-15 LAB — VITAMIN D 25 HYDROXY (VIT D DEFICIENCY, FRACTURES): VITD: 39.57 ng/mL (ref 30.00–100.00)

## 2024-08-15 LAB — HEMOGLOBIN A1C: Hgb A1c MFr Bld: 6.4 % (ref 4.6–6.5)

## 2024-08-16 ENCOUNTER — Encounter: Payer: Self-pay | Admitting: Obstetrics and Gynecology

## 2024-08-16 MED ORDER — VALACYCLOVIR HCL 500 MG PO TABS: ORAL_TABLET | ORAL | 2 refills | Status: AC

## 2024-08-16 NOTE — Telephone Encounter (Signed)
 Med refill request: valacyclovir  500 mg tab po twice daily for 3 days PRN for genital outbreak; Take 4 tablets (2000 mg) by mouth twice a day for 1 day as needed for an oral outbreak    Hx of HSV 1 and 2  Last AEX: 07/11/24 -BS  Next AEX: 07/16/25 -BS  Last MMG (if hormonal med) N/A  Refill authorized: Please Advise? Dr. Nikki -Rx pended for 30 day supply, please advise if any additional refills.

## 2024-08-22 ENCOUNTER — Encounter: Payer: Self-pay | Admitting: Family Medicine

## 2024-08-22 ENCOUNTER — Ambulatory Visit: Payer: Self-pay | Admitting: Family Medicine

## 2024-08-22 ENCOUNTER — Ambulatory Visit (INDEPENDENT_AMBULATORY_CARE_PROVIDER_SITE_OTHER): Admitting: Family Medicine

## 2024-08-22 VITALS — BP 130/60 | HR 66 | Temp 97.9°F | Ht 63.5 in | Wt 126.0 lb

## 2024-08-22 DIAGNOSIS — Z0000 Encounter for general adult medical examination without abnormal findings: Secondary | ICD-10-CM

## 2024-08-22 DIAGNOSIS — Z96641 Presence of right artificial hip joint: Secondary | ICD-10-CM

## 2024-08-22 DIAGNOSIS — I6522 Occlusion and stenosis of left carotid artery: Secondary | ICD-10-CM

## 2024-08-22 DIAGNOSIS — E538 Deficiency of other specified B group vitamins: Secondary | ICD-10-CM

## 2024-08-22 DIAGNOSIS — E559 Vitamin D deficiency, unspecified: Secondary | ICD-10-CM

## 2024-08-22 DIAGNOSIS — Z7189 Other specified counseling: Secondary | ICD-10-CM

## 2024-08-22 DIAGNOSIS — E785 Hyperlipidemia, unspecified: Secondary | ICD-10-CM | POA: Diagnosis not present

## 2024-08-22 DIAGNOSIS — M85832 Other specified disorders of bone density and structure, left forearm: Secondary | ICD-10-CM

## 2024-08-22 DIAGNOSIS — F4321 Adjustment disorder with depressed mood: Secondary | ICD-10-CM

## 2024-08-22 DIAGNOSIS — I10 Essential (primary) hypertension: Secondary | ICD-10-CM | POA: Diagnosis not present

## 2024-08-22 DIAGNOSIS — Z23 Encounter for immunization: Secondary | ICD-10-CM

## 2024-08-22 DIAGNOSIS — D509 Iron deficiency anemia, unspecified: Secondary | ICD-10-CM

## 2024-08-22 DIAGNOSIS — N390 Urinary tract infection, site not specified: Secondary | ICD-10-CM

## 2024-08-22 DIAGNOSIS — I25119 Atherosclerotic heart disease of native coronary artery with unspecified angina pectoris: Secondary | ICD-10-CM

## 2024-08-22 DIAGNOSIS — R112 Nausea with vomiting, unspecified: Secondary | ICD-10-CM

## 2024-08-22 DIAGNOSIS — K559 Vascular disorder of intestine, unspecified: Secondary | ICD-10-CM

## 2024-08-22 DIAGNOSIS — K2100 Gastro-esophageal reflux disease with esophagitis, without bleeding: Secondary | ICD-10-CM | POA: Diagnosis not present

## 2024-08-22 DIAGNOSIS — R7303 Prediabetes: Secondary | ICD-10-CM | POA: Diagnosis not present

## 2024-08-22 DIAGNOSIS — K582 Mixed irritable bowel syndrome: Secondary | ICD-10-CM

## 2024-08-22 DIAGNOSIS — M533 Sacrococcygeal disorders, not elsewhere classified: Secondary | ICD-10-CM

## 2024-08-22 DIAGNOSIS — K5909 Other constipation: Secondary | ICD-10-CM

## 2024-08-22 MED ORDER — ESOMEPRAZOLE MAGNESIUM 40 MG PO CPDR: 40.0000 mg | DELAYED_RELEASE_CAPSULE | Freq: Every day | ORAL | 3 refills | Status: AC

## 2024-08-22 MED ORDER — ASPIRIN 81 MG PO TBEC: 81.0000 mg | DELAYED_RELEASE_TABLET | Freq: Every day | ORAL | Status: AC

## 2024-08-22 MED ORDER — ONDANSETRON HCL 4 MG PO TABS: ORAL_TABLET | ORAL | 0 refills | Status: DC

## 2024-08-22 MED ORDER — LOSARTAN POTASSIUM 50 MG PO TABS: 75.0000 mg | ORAL_TABLET | Freq: Every day | ORAL | 3 refills | Status: AC

## 2024-08-22 NOTE — Assessment & Plan Note (Signed)
 Chronic issue, hesitant for mood medication.  Discussed possible SNRI ie cymbalta, handout provided - she will let me know if interested in trial of this medication.

## 2024-08-22 NOTE — Patient Instructions (Addendum)
 Flu shot today  For chronic tailbone pain, let me know if you'd like to see pelvic floor physical therapy or orthopedist for further evaluation.  Consider duloxetine (Cymbalta) for mood, IBS, and pain. See handout provided Return in 4-6 months for follow up visit.  Good to see you today.

## 2024-08-22 NOTE — Assessment & Plan Note (Signed)
 Linzess didn't help. Continue GI f/u

## 2024-08-22 NOTE — Assessment & Plan Note (Signed)
 Preventative protocols reviewed and updated unless pt declined. Discussed healthy diet and lifestyle.

## 2024-08-22 NOTE — Assessment & Plan Note (Addendum)
 Chronic, on losartan  75mg  and carvedilol  6.25mg  bid.  BP stable on repeat testing.  Continue current regimen.

## 2024-08-22 NOTE — Assessment & Plan Note (Signed)
 Chronic, overall stable on current regimen atorvastatin  10mg  daily through cardiology.  The 10-year ASCVD risk score (Arnett DK, et al., 2019) is: 32.4%   Values used to calculate the score:     Age: 78 years     Clincally relevant sex: Female     Is Non-Hispanic African American: No     Diabetic: No     Tobacco smoker: No     Systolic Blood Pressure: 130 mmHg     Is BP treated: Yes     HDL Cholesterol: 54.4 mg/dL     Total Cholesterol: 150 mg/dL

## 2024-08-22 NOTE — Assessment & Plan Note (Addendum)
 She has stopped oral vit B12 replacement, levels remain ok.

## 2024-08-22 NOTE — Assessment & Plan Note (Signed)
 Followed yearly by cardiology .

## 2024-08-22 NOTE — Assessment & Plan Note (Signed)
 Continue PRN bentyl . Consider SNRI.

## 2024-08-22 NOTE — Progress Notes (Signed)
 Ph: [phone] Fax: [phone]   Patient ID: Bianca Fry, female    DOB: 04-23-1946, 78 y.o.   MRN: [id number]  This visit was conducted in person.  BP 130/60 (BP Location: Right Arm, Cuff Size: Normal)   Pulse 66   Temp 97.9 F (36.6 C) (Oral)   Ht 5' 3.5 (1.613 m)   Wt 126 lb (57.2 kg)   LMP 10/20/1979 (Within Years)   SpO2 97%   BMI 21.97 kg/m   BP Readings from Last 3 Encounters:  08/22/24 130/60  08/08/24 114/60  07/25/24 135/63   CC: CPE Subjective:   HPI: Bianca Fry is a 78 y.o. female presenting on 08/22/2024 for Annual Exam   Saw health advisor 05/2024 for medicare wellness visit. Note reviewed.   No results found.  Flowsheet Row Office Visit from 08/22/2024 in [facility] [place]  PHQ-2 Total Score 1       08/22/2024   11:23 AM 08/08/2024   11:25 AM 07/11/2024   10:10 AM 06/01/2024    8:23 AM 02/16/2024   10:03 AM  Fall Risk   Falls in the past year? 0 0 0 0 0  Number falls in past yr: 0 0 0 0   Injury with Fall? 0 0 0 0   Risk for fall due to : No Fall Risks No Fall Risks No Fall Risks No Fall Risks   Follow up Falls evaluation completed Falls evaluation completed Falls evaluation completed Education provided;Falls prevention discussed    Husband having upcoming ankle surgery - she has been more active due to this.   Coccyx pain for ~3 years - xray 03/2023 abnormal (relative lucency of distal sacrum proximal to Simpson joint), CT scan was reassuring 04/2023 - prior lucency seen was not reproduced - thought artifactual. This improved temporarily, again exacerbated after R hip replacement surgery 11/2023. Rpt xray 07/2024 WNL. Offered ortho vs PFPT referral - she will check with PM&R recommendations and monitor symptoms as they may be some better now.    Chronic rUTI and atrophic vaginitis - sees urology Bianca Fry) yearly on keflex  250mg  daily. Did not tolerate estrace  or premarin  creams. Also taking D-mannose with benefit.    H/o  diverticulitis and ischemic colitis and IBS alternating D/C - overall better since stopping oral iron  replacement. Tolerates slo-release iron  better. [facility] appt tomorrow. Linzess effect is hit or miss.ongoing alternating diarrhea/constipation.   She continues esomeprazole  40mg  daily. No longer on NSAID.   CIDP saw Bianca Fry 04/2024 - referred to PM&R Bianca Fry.   Preventative: COLONOSCOPY Date: 03/2015 mod diverticulosis with focal colitis Bianca Fry) rpt 10 yrs  Colonoscopy 03/2021 - ischemic colitis Bianca Fry)  Well woman exam - yearly GYN Bianca Fry (06/2024). S/p hysterectomy 1985. On estrace  cream. [facility]2023 [facility]  DEXA 01/2015 - T score -2.3 R hip, -1.6 spine DEXA 09/2019 -  T score -2.3 at R hip, -1.5 spine, increased hip fracture risk - tried fosamax  which caused hip pain.  DEXA 10/2021 - T -1.9 L femur, -2.1 L forearm - did not meet criteria for FRAX score Continues calcium  and vitamin D  supplement. Encouraged regular weight bearing exercises.  Lung cancer screening - not eligible  Flu shot yearly  COVID vaccine - Pfizer 11/2019, 12/2019, booster 10/2020  Td 2004, Tdap 2012  Pneumovax 2010, Prevnar-13 2015, pnemovax 03/2016, prevnar-20 discussed, deferred zostavax - 07/2010  Shingrix - 01/2018, 04/2018  RSV - discussed, to consider  Advanced directive discussion - Has this at home. Would want husband to be HCPOA. Ok with CPR and intubation for possibly reversible condition, but would not want prolonged life support if terminal condition. Asked to bring us  copy.  Seat belt use discussed.  Sunscreen use discussed, no changing moles on skin, sees derm yearly (Bianca Fry) - upcoming appt  Non smoker  Alcohol - none  Dentist - Q6 mo  Eye exam - yearly  Bowel - chronic, irregular  Bladder - urge incontinence    Daily caffeine Lives with husband  Occupation: retired, was in audiological scientist Activity: walking 20-30 min/day  Diet: good water, fruits/vegetables  daily     Relevant past medical, surgical, family and social history reviewed and updated as indicated. Interim medical history since our last visit reviewed. Allergies and medications reviewed and updated. Outpatient Medications Prior to Visit  Medication Sig Dispense Refill   acidophilus (RISAQUAD) CAPS capsule Take 1 capsule by mouth daily. 90 capsule 1   ALPRAZolam  (XANAX ) 0.5 MG tablet TAKE ONE TABLET BY MOUTH TWICE A DAY AS NEEDED FOR ANXIETY 40 tablet 0   atorvastatin  (LIPITOR) 10 MG tablet TAKE ONE TABLET BY MOUTH ONE TIME DAILY 90 tablet 3   Calcium  Carb-Cholecalciferol  (CALCIUM -VITAMIN D ) 600-400 MG-UNIT TABS Take 1 tablet by mouth daily.     carvedilol  (COREG ) 6.25 MG tablet TAKE ONE TABLET BY MOUTH TWICE A DAY WITH FOOD 180 tablet 3   cephALEXin  (KEFLEX ) 250 MG capsule Take 250 mg by mouth daily.     cholecalciferol  (VITAMIN D ) 25 MCG (1000 UNIT) tablet Take 1,000 Units by mouth daily.     CLINDAMYCIN  HCL PO Take by mouth. TAKES PRIOR TO DENTAL PROCEDURE     dicyclomine  (BENTYL ) 10 MG capsule Take 1 capsule (10 mg total) by mouth 2 (two) times daily as needed for spasms. 90 capsule 1   estradiol  (ESTRACE ) 0.1 MG/GM vaginal cream Use 1/2 g vaginally two or three times per week as needed to maintain symptom relief. 42.5 g 2   fexofenadine (ALLEGRA) 180 MG tablet Take 180 mg by mouth daily as needed for allergies.     linaclotide (LINZESS) 145 MCG CAPS capsule Take 145 mcg by mouth.     methocarbamol  (ROBAXIN ) 500 MG tablet TAKE ONE TABLET BY MOUTH THREE TIMES A DAY AS NEEDED FOR MUSCLE SPASMS (SEDATION PRECAUTIONS) 30 tablet 0   polyethylene glycol powder (GLYCOLAX /MIRALAX ) 17 GM/SCOOP powder Take 17 g by mouth 2 (two) times daily as needed for moderate constipation (hold for diarrhea).     senna (SENOKOT) 8.6 MG tablet Take 1 tablet (8.6 mg total) by mouth daily.     valACYclovir  (VALTREX ) 500 MG tablet Take one tablet (500 mg) by mouth twice a day for 3 days as needed for a genital  outbreak.  Take 4 tablets (2000 mg) by mouth twice a day for 1 day as needed for an oral outbreak. 30 tablet 2   aspirin  EC 81 MG tablet Take 1 tablet (81 mg total) by mouth in the morning and at bedtime. Swallow whole.     esomeprazole  (NEXIUM ) 40 MG capsule Take 1 capsule (40 mg total) by mouth daily.     losartan  (COZAAR ) 50 MG tablet Take 1.5 tablets (75 mg total) by mouth daily. 135 tablet 3   ondansetron  (ZOFRAN ) 4 MG tablet TAKE 1 TABLET BY MOUTH EVERY 8 HOURS AS NEEDED FOR NAUSEA AND VOMITING 20 tablet 0   sucralfate  (CARAFATE ) 1 g tablet Take 1 tablet (1 g  total) by mouth 3 (three) times daily with meals as needed (reflux). 60 tablet 0   No facility-administered medications prior to visit.     Per HPI unless specifically indicated in ROS section below Review of Systems  Constitutional:  Negative for activity change, appetite change, chills, fatigue, fever and unexpected weight change.  HENT:  Negative for hearing loss.   Eyes:  Negative for visual disturbance.  Respiratory:  Negative for cough, chest tightness, shortness of breath and wheezing.   Cardiovascular:  Positive for chest pain (thought reflux related). Negative for palpitations and leg swelling.  Gastrointestinal:  Positive for abdominal pain, constipation, diarrhea and nausea. Negative for abdominal distention, blood in stool and vomiting.  Genitourinary:  Negative for difficulty urinating and hematuria.  Musculoskeletal:  Negative for arthralgias, myalgias and neck pain.  Skin:  Negative for rash.  Neurological:  Negative for dizziness, seizures, syncope and headaches.  Hematological:  Negative for adenopathy. Does not bruise/bleed easily.  Psychiatric/Behavioral:  Negative for dysphoric mood. The patient is nervous/anxious.     Objective:  BP 130/60 (BP Location: Right Arm, Cuff Size: Normal)   Pulse 66   Temp 97.9 F (36.6 C) (Oral)   Ht 5' 3.5 (1.613 m)   Wt 126 lb (57.2 kg)   LMP 10/20/1979 (Within Years)    SpO2 97%   BMI 21.97 kg/m   Wt Readings from Last 3 Encounters:  08/22/24 126 lb (57.2 kg)  08/08/24 125 lb 6 oz (56.9 kg)  07/25/24 125 lb (56.7 kg)      Physical Exam Vitals and nursing note reviewed.  Constitutional:      Appearance: Normal appearance. She is not ill-appearing.  HENT:     Head: Normocephalic and atraumatic.     Right Ear: Tympanic membrane, ear canal and external ear normal. There is no impacted cerumen.     Left Ear: Tympanic membrane, ear canal and external ear normal. There is no impacted cerumen.     Mouth/Throat:     Mouth: Mucous membranes are moist.     Pharynx: Oropharynx is clear. No oropharyngeal exudate or posterior oropharyngeal erythema.  Eyes:     General:        Right eye: No discharge.        Left eye: No discharge.     Extraocular Movements: Extraocular movements intact.     Conjunctiva/sclera: Conjunctivae normal.     Pupils: Pupils are equal, round, and reactive to light.  Neck:     Thyroid : No thyroid  mass or thyromegaly.     Vascular: Carotid bruit (left sided) present.  Cardiovascular:     Rate and Rhythm: Normal rate and regular rhythm.     Pulses: Normal pulses.     Heart sounds: Normal heart sounds. No murmur heard. Pulmonary:     Effort: Pulmonary effort is normal. No respiratory distress.     Breath sounds: Normal breath sounds. No wheezing, rhonchi or rales.  Abdominal:     General: Bowel sounds are normal. There is no distension.     Palpations: Abdomen is soft. There is no mass.     Tenderness: There is no abdominal tenderness. There is no guarding or rebound.     Hernia: No hernia is present.  Musculoskeletal:     Cervical back: Normal range of motion and neck supple. No rigidity.     Right lower leg: No edema.     Left lower leg: No edema.  Lymphadenopathy:     Cervical: No cervical  adenopathy.  Skin:    General: Skin is warm and dry.     Findings: No rash.  Neurological:     General: No focal deficit present.      Mental Status: She is alert. Mental status is at baseline.  Psychiatric:        Mood and Affect: Mood normal.        Behavior: Behavior normal.       Results for orders placed or performed in visit on 08/15/24  CBC with Differential/Platelet   Collection Time: 08/15/24  8:00 AM  Result Value Ref Range   WBC 8.1 4.0 - 10.5 K/uL   RBC 4.06 3.87 - 5.11 Mil/uL   Hemoglobin 12.4 12.0 - 15.0 g/dL   HCT 63.0 63.9 - 53.9 %   MCV 90.9 78.0 - 100.0 fl   MCHC 33.6 30.0 - 36.0 g/dL   RDW 85.6 88.4 - 84.4 %   Platelets 196.0 150.0 - 400.0 K/uL   Neutrophils Relative % 49.1 43.0 - 77.0 %   Lymphocytes Relative 41.4 12.0 - 46.0 %   Monocytes Relative 8.2 3.0 - 12.0 %   Eosinophils Relative 1.0 0.0 - 5.0 %   Basophils Relative 0.3 0.0 - 3.0 %   Neutro Abs 4.0 1.4 - 7.7 K/uL   Lymphs Abs 3.3 0.7 - 4.0 K/uL   Monocytes Absolute 0.7 0.1 - 1.0 K/uL   Eosinophils Absolute 0.1 0.0 - 0.7 K/uL   Basophils Absolute 0.0 0.0 - 0.1 K/uL  Hemoglobin A1c   Collection Time: 08/15/24  8:00 AM  Result Value Ref Range   Hgb A1c MFr Bld 6.4 4.6 - 6.5 %  Ferritin   Collection Time: 08/15/24  8:00 AM  Result Value Ref Range   Ferritin 291.5 (H) 10.0 - 291.0 ng/mL  IBC panel   Collection Time: 08/15/24  8:00 AM  Result Value Ref Range   Iron  87 42 - 145 ug/dL   Transferrin 806.9 (L) 212.0 - 360.0 mg/dL   Saturation Ratios 67.7 20.0 - 50.0 %   TIBC 270.2 250.0 - 450.0 mcg/dL  Sedimentation rate   Collection Time: 08/15/24  8:00 AM  Result Value Ref Range   Sed Rate 28 0 - 30 mm/hr  VITAMIN D  25 Hydroxy (Vit-D Deficiency, Fractures)   Collection Time: 08/15/24  8:00 AM  Result Value Ref Range   VITD 39.57 30.00 - 100.00 ng/mL  Vitamin B12   Collection Time: 08/15/24  8:00 AM  Result Value Ref Range   Vitamin B-12 367 211 - 911 pg/mL  TSH   Collection Time: 08/15/24  8:00 AM  Result Value Ref Range   TSH 2.64 0.35 - 5.50 uIU/mL  Comprehensive metabolic panel with GFR   Collection Time: 08/15/24   8:00 AM  Result Value Ref Range   Sodium 138 135 - 145 mEq/L   Potassium 4.1 3.5 - 5.1 mEq/L   Chloride 102 96 - 112 mEq/L   CO2 29 19 - 32 mEq/L   Glucose, Bld 109 (H) 70 - 99 mg/dL   BUN 14 6 - 23 mg/dL   Creatinine, Ser 9.16 0.40 - 1.20 mg/dL   Total Bilirubin 0.4 0.2 - 1.2 mg/dL   Alkaline Phosphatase 71 39 - 117 U/L   AST 11 0 - 37 U/L   ALT 14 0 - 35 U/L   Total Protein 6.3 6.0 - 8.3 g/dL   Albumin 4.0 3.5 - 5.2 g/dL   GFR 33.05 >39.99 mL/min   Calcium  10.6 (  H) 8.4 - 10.5 mg/dL  Lipid panel   Collection Time: 08/15/24  8:00 AM  Result Value Ref Range   Cholesterol 150 0 - 200 mg/dL   Triglycerides 848.9 (H) 0.0 - 149.0 mg/dL   HDL 45.59 >60.99 mg/dL   VLDL 69.7 0.0 - 59.9 mg/dL   LDL Cholesterol 65 0 - 99 mg/dL   Total CHOL/HDL Ratio 3    NonHDL 95.44     Assessment & Plan:   Problem List Items Addressed This Visit     Advanced care planning/counseling discussion (Chronic)   Advanced directive discussion - Has this at home. Would want husband to be HCPOA. Ok with CPR and intubation for possibly reversible condition, but would not want prolonged life support if terminal condition. Asked to bring us  copy.       Health maintenance examination - Primary (Chronic)   Preventative protocols reviewed and updated unless pt declined. Discussed healthy diet and lifestyle.       HLD (hyperlipidemia)   Chronic, overall stable on current regimen atorvastatin  10mg  daily through cardiology.  The 10-year ASCVD risk score (Arnett DK, et al., 2019) is: 32.4%   Values used to calculate the score:     Age: 65 years     Clincally relevant sex: Female     Is Non-Hispanic African American: No     Diabetic: No     Tobacco smoker: No     Systolic Blood Pressure: 130 mmHg     Is BP treated: Yes     HDL Cholesterol: 54.4 mg/dL     Total Cholesterol: 150 mg/dL       Relevant Medications   losartan  (COZAAR ) 50 MG tablet   aspirin  EC 81 MG tablet   Iron  deficiency anemia    Intolerant to oral iron  S/p Feraheme iron  infusions 02/2024 x2 Didn't note significant benefit in energy/fatigue after this, however iron  levels are much improved.       Adjustment disorder with depressed mood   Chronic issue, hesitant for mood medication.  Discussed possible SNRI ie cymbalta, handout provided - she will let me know if interested in trial of this medication.       Essential hypertension   Chronic, on losartan  75mg  and carvedilol  6.25mg  bid.  BP stable on repeat testing.  Continue current regimen.       Relevant Medications   losartan  (COZAAR ) 50 MG tablet   aspirin  EC 81 MG tablet   GERD   Chronic on esomeprazole  40mg  daily - continue. Upcoming GI f/u.  last EGD 1999      Relevant Medications   ondansetron  (ZOFRAN ) 4 MG tablet   esomeprazole  (NEXIUM ) 40 MG capsule   Recurrent UTI   Continues keflex  250mg  daily. [facility] seen [facility].      Ischemic colitis   Quiescent period. Appreciate GI care.       CAD (coronary artery disease)   Continue statin, aspirin .       Relevant Medications   losartan  (COZAAR ) 50 MG tablet   aspirin  EC 81 MG tablet   Left carotid artery stenosis   Followed yearly by cardiology.       Relevant Medications   losartan  (COZAAR ) 50 MG tablet   aspirin  EC 81 MG tablet   Prediabetes   Continue to encourage limiting added sugar/carbs in diet.       Vitamin D  deficiency   Continue regular replacement.      Irritable bowel syndrome (IBS)   Continue  PRN bentyl . Consider SNRI.       Relevant Medications   ondansetron  (ZOFRAN ) 4 MG tablet   esomeprazole  (NEXIUM ) 40 MG capsule   Osteopenia   Continue calcium , vit D and regular walking.       Coccyx pain   Chronic coccydynia since ~2021.  Discussed PFPT referral vs ortho referral for further evaluation.  Await PM&R recs first.      Relevant Medications   aspirin  EC 81 MG tablet   Chronic constipation   Linzess didn't help. Continue GI f/u        Low serum vitamin B12   She has stopped oral vit B12 replacement, levels remain ok.       Hx of total hip arthroplasty, right   Other Visit Diagnoses       Nausea and vomiting, unspecified vomiting type       Relevant Medications   ondansetron  (ZOFRAN ) 4 MG tablet     Encounter for immunization       Relevant Orders   Flu vaccine HIGH DOSE PF(Fluzone Trivalent) (Completed)        Meds ordered this encounter  Medications   ondansetron  (ZOFRAN ) 4 MG tablet    Sig: TAKE 1 TABLET BY MOUTH EVERY 8 HOURS AS NEEDED FOR NAUSEA AND VOMITING    Dispense:  20 tablet    Refill:  0   esomeprazole  (NEXIUM ) 40 MG capsule    Sig: Take 1 capsule (40 mg total) by mouth daily.    Dispense:  90 capsule    Refill:  3   losartan  (COZAAR ) 50 MG tablet    Sig: Take 1.5 tablets (75 mg total) by mouth daily.    Dispense:  135 tablet    Refill:  3   aspirin  EC 81 MG tablet    Sig: Take 1 tablet (81 mg total) by mouth daily. Swallow whole.    Orders Placed This Encounter  Procedures   Flu vaccine HIGH DOSE PF(Fluzone Trivalent)    Patient Instructions  Flu shot today  For chronic tailbone pain, let me know if you'd like to see pelvic floor physical therapy or orthopedist for further evaluation.  Consider duloxetine (Cymbalta) for mood, IBS, and pain. See handout provided Return in 4-6 months for follow up visit.  Good to see you today.   Follow up plan: Return in about 4 months (around 12/20/2024) for follow up visit.  Anton Blas, MD

## 2024-08-22 NOTE — Assessment & Plan Note (Addendum)
 Chronic on esomeprazole  40mg  daily - continue. Upcoming GI f/u.  last EGD 1999

## 2024-08-22 NOTE — Assessment & Plan Note (Addendum)
 Continues keflex  250mg  daily. Has seen [facility].

## 2024-08-22 NOTE — Assessment & Plan Note (Signed)
Continue regular replacement.  

## 2024-08-22 NOTE — Assessment & Plan Note (Signed)
Continue calcium , vit D and regular walking.

## 2024-08-22 NOTE — Assessment & Plan Note (Signed)
 Continue to encourage limiting added sugar/carbs in diet.

## 2024-08-22 NOTE — Assessment & Plan Note (Signed)
 Chronic coccydynia since ~2021.  Discussed PFPT referral vs ortho referral for further evaluation.  Await PM&R recs first.

## 2024-08-22 NOTE — Assessment & Plan Note (Signed)
Continue statin, aspirin 

## 2024-08-22 NOTE — Assessment & Plan Note (Signed)
 Quiescent period. Appreciate GI care.

## 2024-08-22 NOTE — Assessment & Plan Note (Addendum)
 Advanced directive discussion - Has this at home. Would want husband to be HCPOA. Ok with CPR and intubation for possibly reversible condition, but would not want prolonged life support if terminal condition. Asked to bring us  copy.

## 2024-08-22 NOTE — Assessment & Plan Note (Signed)
 Intolerant to oral iron  S/p Feraheme iron  infusions 02/2024 x2 Didn't note significant benefit in energy/fatigue after this, however iron  levels are much improved.

## 2024-08-23 DIAGNOSIS — R1032 Left lower quadrant pain: Secondary | ICD-10-CM | POA: Diagnosis not present

## 2024-08-23 DIAGNOSIS — M5441 Lumbago with sciatica, right side: Secondary | ICD-10-CM | POA: Diagnosis not present

## 2024-08-23 DIAGNOSIS — Z8719 Personal history of other diseases of the digestive system: Secondary | ICD-10-CM | POA: Diagnosis not present

## 2024-08-23 DIAGNOSIS — R110 Nausea: Secondary | ICD-10-CM | POA: Diagnosis not present

## 2024-08-23 DIAGNOSIS — K581 Irritable bowel syndrome with constipation: Secondary | ICD-10-CM | POA: Diagnosis not present

## 2024-08-23 DIAGNOSIS — K5730 Diverticulosis of large intestine without perforation or abscess without bleeding: Secondary | ICD-10-CM | POA: Diagnosis not present

## 2024-08-23 DIAGNOSIS — M533 Sacrococcygeal disorders, not elsewhere classified: Secondary | ICD-10-CM | POA: Diagnosis not present

## 2024-08-23 DIAGNOSIS — G8929 Other chronic pain: Secondary | ICD-10-CM | POA: Diagnosis not present

## 2024-08-24 ENCOUNTER — Other Ambulatory Visit: Payer: Self-pay | Admitting: Physical Medicine & Rehabilitation

## 2024-08-24 ENCOUNTER — Encounter: Payer: Self-pay | Admitting: Family Medicine

## 2024-08-24 DIAGNOSIS — M5416 Radiculopathy, lumbar region: Secondary | ICD-10-CM

## 2024-08-24 MED ORDER — DULOXETINE HCL 20 MG PO CPEP: 20.0000 mg | ORAL_CAPSULE | Freq: Every day | ORAL | 3 refills | Status: DC

## 2024-08-28 ENCOUNTER — Other Ambulatory Visit

## 2024-08-29 ENCOUNTER — Inpatient Hospital Stay
Admission: RE | Admit: 2024-08-29 | Discharge: 2024-08-29 | Attending: Physical Medicine & Rehabilitation | Admitting: Physical Medicine & Rehabilitation

## 2024-08-29 DIAGNOSIS — M47816 Spondylosis without myelopathy or radiculopathy, lumbar region: Secondary | ICD-10-CM | POA: Diagnosis not present

## 2024-08-29 DIAGNOSIS — M5416 Radiculopathy, lumbar region: Secondary | ICD-10-CM

## 2024-09-06 DIAGNOSIS — C44612 Basal cell carcinoma of skin of right upper limb, including shoulder: Secondary | ICD-10-CM | POA: Diagnosis not present

## 2024-09-06 DIAGNOSIS — Z85828 Personal history of other malignant neoplasm of skin: Secondary | ICD-10-CM | POA: Diagnosis not present

## 2024-09-06 DIAGNOSIS — L570 Actinic keratosis: Secondary | ICD-10-CM | POA: Diagnosis not present

## 2024-09-08 ENCOUNTER — Other Ambulatory Visit: Payer: Self-pay

## 2024-09-08 ENCOUNTER — Emergency Department

## 2024-09-08 ENCOUNTER — Inpatient Hospital Stay
Admission: EM | Admit: 2024-09-08 | Discharge: 2024-09-11 | DRG: 392 | Disposition: A | Discharge status: Home / Self Care | Attending: Internal Medicine | Admitting: Internal Medicine

## 2024-09-08 DIAGNOSIS — Z96641 Presence of right artificial hip joint: Secondary | ICD-10-CM | POA: Diagnosis not present

## 2024-09-08 DIAGNOSIS — K529 Noninfective gastroenteritis and colitis, unspecified: Secondary | ICD-10-CM | POA: Diagnosis not present

## 2024-09-08 DIAGNOSIS — Z85828 Personal history of other malignant neoplasm of skin: Secondary | ICD-10-CM

## 2024-09-08 DIAGNOSIS — E876 Hypokalemia: Secondary | ICD-10-CM | POA: Diagnosis not present

## 2024-09-08 DIAGNOSIS — Z888 Allergy status to other drugs, medicaments and biological substances status: Secondary | ICD-10-CM

## 2024-09-08 DIAGNOSIS — Z881 Allergy status to other antibiotic agents status: Secondary | ICD-10-CM | POA: Diagnosis not present

## 2024-09-08 DIAGNOSIS — E785 Hyperlipidemia, unspecified: Secondary | ICD-10-CM | POA: Diagnosis present

## 2024-09-08 DIAGNOSIS — F419 Anxiety disorder, unspecified: Secondary | ICD-10-CM | POA: Diagnosis present

## 2024-09-08 DIAGNOSIS — Z8616 Personal history of COVID-19: Secondary | ICD-10-CM

## 2024-09-08 DIAGNOSIS — E871 Hypo-osmolality and hyponatremia: Secondary | ICD-10-CM | POA: Diagnosis present

## 2024-09-08 DIAGNOSIS — Z87440 Personal history of urinary (tract) infections: Secondary | ICD-10-CM | POA: Diagnosis not present

## 2024-09-08 DIAGNOSIS — R7303 Prediabetes: Secondary | ICD-10-CM | POA: Diagnosis present

## 2024-09-08 DIAGNOSIS — Z83438 Family history of other disorder of lipoprotein metabolism and other lipidemia: Secondary | ICD-10-CM

## 2024-09-08 DIAGNOSIS — Z9841 Cataract extraction status, right eye: Secondary | ICD-10-CM

## 2024-09-08 DIAGNOSIS — Z880 Allergy status to penicillin: Secondary | ICD-10-CM | POA: Diagnosis not present

## 2024-09-08 DIAGNOSIS — Z833 Family history of diabetes mellitus: Secondary | ICD-10-CM

## 2024-09-08 DIAGNOSIS — Z981 Arthrodesis status: Secondary | ICD-10-CM

## 2024-09-08 DIAGNOSIS — Z823 Family history of stroke: Secondary | ICD-10-CM

## 2024-09-08 DIAGNOSIS — Z961 Presence of intraocular lens: Secondary | ICD-10-CM | POA: Diagnosis present

## 2024-09-08 DIAGNOSIS — Z885 Allergy status to narcotic agent status: Secondary | ICD-10-CM

## 2024-09-08 DIAGNOSIS — I739 Peripheral vascular disease, unspecified: Secondary | ICD-10-CM | POA: Diagnosis not present

## 2024-09-08 DIAGNOSIS — I2510 Atherosclerotic heart disease of native coronary artery without angina pectoris: Secondary | ICD-10-CM | POA: Diagnosis not present

## 2024-09-08 DIAGNOSIS — Z9049 Acquired absence of other specified parts of digestive tract: Secondary | ICD-10-CM

## 2024-09-08 DIAGNOSIS — Z882 Allergy status to sulfonamides status: Secondary | ICD-10-CM | POA: Diagnosis not present

## 2024-09-08 DIAGNOSIS — Z7982 Long term (current) use of aspirin: Secondary | ICD-10-CM | POA: Diagnosis not present

## 2024-09-08 DIAGNOSIS — Z9842 Cataract extraction status, left eye: Secondary | ICD-10-CM

## 2024-09-08 DIAGNOSIS — N390 Urinary tract infection, site not specified: Secondary | ICD-10-CM | POA: Diagnosis not present

## 2024-09-08 DIAGNOSIS — I10 Essential (primary) hypertension: Secondary | ICD-10-CM | POA: Diagnosis not present

## 2024-09-08 DIAGNOSIS — Z883 Allergy status to other anti-infective agents status: Secondary | ICD-10-CM

## 2024-09-08 DIAGNOSIS — K559 Vascular disorder of intestine, unspecified: Secondary | ICD-10-CM | POA: Diagnosis not present

## 2024-09-08 DIAGNOSIS — K219 Gastro-esophageal reflux disease without esophagitis: Secondary | ICD-10-CM | POA: Diagnosis not present

## 2024-09-08 DIAGNOSIS — Z79899 Other long term (current) drug therapy: Secondary | ICD-10-CM

## 2024-09-08 DIAGNOSIS — K449 Diaphragmatic hernia without obstruction or gangrene: Secondary | ICD-10-CM | POA: Diagnosis not present

## 2024-09-08 DIAGNOSIS — R1032 Left lower quadrant pain: Secondary | ICD-10-CM | POA: Diagnosis not present

## 2024-09-08 DIAGNOSIS — I25119 Atherosclerotic heart disease of native coronary artery with unspecified angina pectoris: Secondary | ICD-10-CM

## 2024-09-08 DIAGNOSIS — E860 Dehydration: Secondary | ICD-10-CM | POA: Diagnosis present

## 2024-09-08 DIAGNOSIS — Z8249 Family history of ischemic heart disease and other diseases of the circulatory system: Secondary | ICD-10-CM | POA: Diagnosis not present

## 2024-09-08 DIAGNOSIS — Z8619 Personal history of other infectious and parasitic diseases: Secondary | ICD-10-CM

## 2024-09-08 LAB — CBC WITH DIFFERENTIAL/PLATELET
Abs Immature Granulocytes: 0.13 K/uL — ABNORMAL HIGH (ref 0.00–0.07)
Basophils Absolute: 0.1 K/uL (ref 0.0–0.1)
Basophils Relative: 0 %
Eosinophils Absolute: 0 K/uL (ref 0.0–0.5)
Eosinophils Relative: 0 %
HCT: 40.8 % (ref 36.0–46.0)
Hemoglobin: 13.5 g/dL (ref 12.0–15.0)
Immature Granulocytes: 1 %
Lymphocytes Relative: 5 %
Lymphs Abs: 1.1 K/uL (ref 0.7–4.0)
MCH: 30.2 pg (ref 26.0–34.0)
MCHC: 33.1 g/dL (ref 30.0–36.0)
MCV: 91.3 fL (ref 80.0–100.0)
Monocytes Absolute: 1.9 K/uL — ABNORMAL HIGH (ref 0.1–1.0)
Monocytes Relative: 9 %
Neutro Abs: 19.4 K/uL — ABNORMAL HIGH (ref 1.7–7.7)
Neutrophils Relative %: 85 %
Platelets: 177 K/uL (ref 150–400)
RBC: 4.47 MIL/uL (ref 3.87–5.11)
RDW: 13.2 % (ref 11.5–15.5)
WBC: 22.6 K/uL — ABNORMAL HIGH (ref 4.0–10.5)
nRBC: 0 % (ref 0.0–0.2)

## 2024-09-08 LAB — COMPREHENSIVE METABOLIC PANEL WITH GFR
ALT: 12 U/L (ref 0–44)
AST: 17 U/L (ref 15–41)
Albumin: 4 g/dL (ref 3.5–5.0)
Alkaline Phosphatase: 153 U/L — ABNORMAL HIGH (ref 38–126)
Anion gap: 9 (ref 5–15)
BUN: 18 mg/dL (ref 8–23)
CO2: 26 mmol/L (ref 22–32)
Calcium: 10.4 mg/dL — ABNORMAL HIGH (ref 8.9–10.3)
Chloride: 99 mmol/L (ref 98–111)
Creatinine, Ser: 0.83 mg/dL (ref 0.44–1.00)
GFR, Estimated: >60 mL/min (ref >60)
Glucose, Bld: 179 mg/dL — ABNORMAL HIGH (ref 70–99)
Potassium: 4.4 mmol/L (ref 3.5–5.1)
Sodium: 134 mmol/L — ABNORMAL LOW (ref 135–145)
Total Bilirubin: 0.6 mg/dL (ref 0.0–1.2)
Total Protein: 6.3 g/dL — ABNORMAL LOW (ref 6.5–8.1)

## 2024-09-08 LAB — LIPASE, BLOOD: Lipase: <10 U/L — ABNORMAL LOW (ref 11–51)

## 2024-09-08 LAB — LACTIC ACID, PLASMA: Lactic Acid, Venous: 2.1 mmol/L (ref 0.5–1.9)

## 2024-09-08 MED ORDER — SODIUM CHLORIDE 0.9 % IV SOLN
2.0000 g | Freq: Once | INTRAVENOUS | Status: DC
  Administered 2024-09-09: 2 g via INTRAVENOUS
  Filled 2024-09-08: qty 20

## 2024-09-08 MED ORDER — KETOROLAC TROMETHAMINE 15 MG/ML IJ SOLN
15.0000 mg | Freq: Four times a day (QID) | INTRAMUSCULAR | Status: DC | PRN
  Administered 2024-09-09 – 2024-09-10 (×2): 15 mg via INTRAVENOUS
  Filled 2024-09-08 (×2): qty 1

## 2024-09-08 MED ORDER — IOHEXOL 300 MG/ML  SOLN
100.0000 mL | Freq: Once | INTRAMUSCULAR | Status: AC | PRN
  Administered 2024-09-08: 100 mL via INTRAVENOUS

## 2024-09-08 MED ORDER — CARVEDILOL 3.125 MG PO TABS
6.2500 mg | ORAL_TABLET | Freq: Two times a day (BID) | ORAL | Status: DC
  Administered 2024-09-09 – 2024-09-11 (×5): 6.25 mg via ORAL
  Filled 2024-09-08 (×2): qty 2
  Filled 2024-09-08: qty 1
  Filled 2024-09-08 (×2): qty 2

## 2024-09-08 MED ORDER — ATORVASTATIN CALCIUM 10 MG PO TABS
10.0000 mg | ORAL_TABLET | Freq: Every day | ORAL | Status: DC
  Administered 2024-09-09 – 2024-09-11 (×3): 10 mg via ORAL
  Filled 2024-09-08 (×3): qty 1

## 2024-09-08 MED ORDER — PIPERACILLIN-TAZOBACTAM 3.375 G IVPB
3.3750 g | Freq: Three times a day (TID) | INTRAVENOUS | Status: DC
  Administered 2024-09-09 – 2024-09-11 (×8): 3.375 g via INTRAVENOUS
  Filled 2024-09-08 (×8): qty 50

## 2024-09-08 MED ORDER — DEXTROSE IN LACTATED RINGERS 5 % IV SOLN: INTRAVENOUS | Status: AC

## 2024-09-08 MED ORDER — LACTATED RINGERS IV BOLUS
1000.0000 mL | Freq: Once | INTRAVENOUS | Status: AC
  Administered 2024-09-08: 1000 mL via INTRAVENOUS

## 2024-09-08 MED ORDER — ALPRAZOLAM 0.5 MG PO TABS
0.5000 mg | ORAL_TABLET | Freq: Three times a day (TID) | ORAL | Status: DC | PRN
Start: 2024-09-08 — End: 2024-09-11
  Administered 2024-09-09 – 2024-09-10 (×3): 0.5 mg via ORAL
  Filled 2024-09-08 (×3): qty 1

## 2024-09-08 MED ORDER — DULOXETINE HCL 20 MG PO CPEP
20.0000 mg | ORAL_CAPSULE | Freq: Every day | ORAL | Status: DC
  Administered 2024-09-09 – 2024-09-11 (×3): 20 mg via ORAL
  Filled 2024-09-08 (×3): qty 1

## 2024-09-08 MED ORDER — METRONIDAZOLE 500 MG/100ML IV SOLN
500.0000 mg | Freq: Once | INTRAVENOUS | Status: DC
Start: 2024-09-08 — End: 2024-09-09
  Filled 2024-09-08: qty 100

## 2024-09-08 MED ORDER — ONDANSETRON HCL 4 MG/2ML IJ SOLN: 4.0000 mg | Freq: Four times a day (QID) | INTRAMUSCULAR | Status: DC | PRN

## 2024-09-08 MED ORDER — PANTOPRAZOLE SODIUM 40 MG PO TBEC
40.0000 mg | DELAYED_RELEASE_TABLET | Freq: Every day | ORAL | Status: DC
  Administered 2024-09-09 – 2024-09-11 (×3): 40 mg via ORAL
  Filled 2024-09-08 (×3): qty 1

## 2024-09-08 MED ORDER — LOSARTAN POTASSIUM 50 MG PO TABS
75.0000 mg | ORAL_TABLET | Freq: Every day | ORAL | Status: DC
  Administered 2024-09-09 – 2024-09-11 (×3): 75 mg via ORAL
  Filled 2024-09-08: qty 2
  Filled 2024-09-08 (×2): qty 1

## 2024-09-08 MED ORDER — ONDANSETRON HCL 4 MG PO TABS: 4.0000 mg | ORAL_TABLET | Freq: Four times a day (QID) | ORAL | Status: DC | PRN

## 2024-09-08 NOTE — ED Notes (Signed)
 Dr. Viviann notified of critical lactic acid of 2.1
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
## 2024-09-08 NOTE — ED Triage Notes (Signed)
 Pt BIB [facility] home c/o N,V,D since 1030, c/o bright red blood in stool. 200 LR, $ Zodran PTA, pt states taking n3 more doses prior to EMS.
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
## 2024-09-09 ENCOUNTER — Encounter: Payer: Self-pay | Admitting: Internal Medicine

## 2024-09-09 DIAGNOSIS — F419 Anxiety disorder, unspecified: Secondary | ICD-10-CM

## 2024-09-09 DIAGNOSIS — E785 Hyperlipidemia, unspecified: Secondary | ICD-10-CM

## 2024-09-09 DIAGNOSIS — I10 Essential (primary) hypertension: Secondary | ICD-10-CM | POA: Diagnosis not present

## 2024-09-09 DIAGNOSIS — I2510 Atherosclerotic heart disease of native coronary artery without angina pectoris: Secondary | ICD-10-CM

## 2024-09-09 DIAGNOSIS — K219 Gastro-esophageal reflux disease without esophagitis: Secondary | ICD-10-CM

## 2024-09-09 DIAGNOSIS — K529 Noninfective gastroenteritis and colitis, unspecified: Secondary | ICD-10-CM | POA: Diagnosis not present

## 2024-09-09 DIAGNOSIS — N390 Urinary tract infection, site not specified: Secondary | ICD-10-CM | POA: Diagnosis not present

## 2024-09-09 DIAGNOSIS — E871 Hypo-osmolality and hyponatremia: Secondary | ICD-10-CM

## 2024-09-09 LAB — CLOSTRIDIUM DIFFICILE BY PCR, REFLEXED
Hypervirulent Strain: NEGATIVE
Toxigenic C. Difficile by PCR: NEGATIVE

## 2024-09-09 LAB — GASTROINTESTINAL PANEL BY PCR, STOOL (REPLACES STOOL CULTURE)

## 2024-09-09 LAB — CBC
HCT: 33.9 % — ABNORMAL LOW (ref 36.0–46.0)
Hemoglobin: 11.6 g/dL — ABNORMAL LOW (ref 12.0–15.0)
MCH: 30.9 pg (ref 26.0–34.0)
MCHC: 34.2 g/dL (ref 30.0–36.0)
MCV: 90.4 fL (ref 80.0–100.0)
Platelets: 164 K/uL (ref 150–400)
RBC: 3.75 MIL/uL — ABNORMAL LOW (ref 3.87–5.11)
RDW: 13.3 % (ref 11.5–15.5)
WBC: 17 K/uL — ABNORMAL HIGH (ref 4.0–10.5)
nRBC: 0 % (ref 0.0–0.2)

## 2024-09-09 LAB — COMPREHENSIVE METABOLIC PANEL WITH GFR
ALT: 10 U/L (ref 0–44)
AST: 14 U/L — ABNORMAL LOW (ref 15–41)
Albumin: 3.4 g/dL — ABNORMAL LOW (ref 3.5–5.0)
Alkaline Phosphatase: 118 U/L (ref 38–126)
Anion gap: 7 (ref 5–15)
BUN: 19 mg/dL (ref 8–23)
CO2: 27 mmol/L (ref 22–32)
Calcium: 9.8 mg/dL (ref 8.9–10.3)
Chloride: 100 mmol/L (ref 98–111)
Creatinine, Ser: 0.85 mg/dL (ref 0.44–1.00)
GFR, Estimated: >60 mL/min (ref >60)
Glucose, Bld: 123 mg/dL — ABNORMAL HIGH (ref 70–99)
Potassium: 4.3 mmol/L (ref 3.5–5.1)
Sodium: 135 mmol/L (ref 135–145)
Total Bilirubin: 0.3 mg/dL (ref 0.0–1.2)
Total Protein: 5.5 g/dL — ABNORMAL LOW (ref 6.5–8.1)

## 2024-09-09 LAB — C DIFFICILE QUICK SCREEN W PCR REFLEX
C Diff antigen: POSITIVE — AB
C Diff toxin: NEGATIVE

## 2024-09-09 NOTE — Assessment & Plan Note (Signed)
 Stool studies are negative.  On empiric Zosyn .  Advanced to solid food.  Patient has 22 allergies and numerous antibiotic allergies.  My choices are limited for oral antibiotics upon going home.  She states she can take Cipro .  5 days of Cipro  prescribed.  Other etiology could be ischemic colitis.

## 2024-09-09 NOTE — Progress Notes (Signed)
  Progress Note   Patient: Bianca Fry FMW:[id number] DOB: [birth year] DOA: 09/08/2024     1 DOS: the patient was seen and examined on 09/09/2024   Brief hospital course: 78 y.o. female with medical history significant of previous colitis, anxiety disorder, coronary artery disease, diverticular disease, GERD, peripheral arterial disease, recurrent UTIs, osteoarthritis, history of shingles, hyperlipidemia, who presented to the ER with abdominal pain and nausea.  Patient also noted bright red blood per rectum.  Symptoms have been going on all day.  Patient seen in the ER and evaluated.  Vitals are stable and H&H stable.  CT abdomen and pelvis however shows acute descending colitis.  Patient has been admitted for further evaluation and treatment   11/22.  Patient having pain in her left lower quadrant with palpation.  Stool studies negative.  On empiric Zosyn .  Assessment and Plan: Acute colitis Stool studies are negative.  On empiric Zosyn .  Advance to full liquid diet.  IV fluids.  Recurrent UTI On prophylactic Keflex  as outpatient  Essential hypertension On Coreg  and losartan   HLD (hyperlipidemia) On Lipitor  GERD On Protonix   Anxiety On Xanax   Hyponatremia Improved        Subjective: Patient feeling little bit better.  Still having some pain in the left lower quadrant.  Still having some diarrhea.  No further nausea vomiting.  Was able to tolerate clear liquids.  Physical Exam: Vitals:   09/09/24 0730 09/09/24 0735 09/09/24 0800 09/09/24 0837  BP:  (!) 152/52 (!) 128/48   Pulse: 81 78 81   Resp: 13 17 18    Temp:    97.6 F (36.4 C)  TempSrc:    Oral  SpO2: 99% 100% 98%   Weight:      Height:       Physical Exam HENT:     Head: Normocephalic.     Mouth/Throat:     Pharynx: No oropharyngeal exudate.  Eyes:     General: Lids are normal.     Conjunctiva/sclera: Conjunctivae normal.  Cardiovascular:     Rate and Rhythm: Normal rate and regular rhythm.      Heart sounds: Normal heart sounds, S1 normal and S2 normal.  Pulmonary:     Breath sounds: No decreased breath sounds, wheezing, rhonchi or rales.  Abdominal:     Palpations: Abdomen is soft.     Tenderness: There is abdominal tenderness in the left lower quadrant.  Musculoskeletal:     Right lower leg: No swelling.     Left lower leg: No swelling.  Skin:    General: Skin is warm.     Findings: No rash.  Neurological:     Mental Status: She is alert and oriented to person, place, and time.     Data Reviewed: Creatinine 0.85, electrolytes normal range, glucose 123, white blood cell count 17, hemoglobin 11.6, platelet 164  Family Communication: Husband at bedside  Disposition: Status is: Inpatient Remains inpatient appropriate because: Continue IV antibiotics.  Advance diet slowly to full liquid diet.  Planned Discharge Destination: Home    Time spent: 28 minutes  Author: Charlie Patterson, MD 09/09/2024 1:25 PM  For on call review [email or web address].

## 2024-09-09 NOTE — Assessment & Plan Note (Signed)
 On Xanax

## 2024-09-09 NOTE — ED Notes (Signed)
 Pt ambulated to the bathroom and back with standby assis. No stool sample was provided

## 2024-09-09 NOTE — Assessment & Plan Note (Signed)
 On Coreg  and losartan 

## 2024-09-09 NOTE — ED Notes (Signed)
 Fall bundle in place. Nonskid socks & fall alert bracelet on pt. Bed alarm activated and audible.

## 2024-09-09 NOTE — ED Notes (Signed)
 At this time, this EDT responded to pt call light, pt requested to go to the BR, pt was ambulatory to BR, pt came back to room and is now in bed at lowest position, locked, and call light within reach.

## 2024-09-09 NOTE — Assessment & Plan Note (Signed)
 Improved.

## 2024-09-09 NOTE — Assessment & Plan Note (Signed)
 On prophylactic Keflex  as outpatient.  Can restart once Cipro  finished.

## 2024-09-09 NOTE — Hospital Course (Signed)
 78 y.o. female with medical history significant of previous colitis, anxiety disorder, coronary artery disease, diverticular disease, GERD, peripheral arterial disease, recurrent UTIs, osteoarthritis, history of shingles, hyperlipidemia, who presented to the ER with abdominal pain and nausea.  Patient also noted bright red blood per rectum.  Symptoms have been going on all day.  Patient seen in the ER and evaluated.  Vitals are stable and H&H stable.  CT abdomen and pelvis however shows acute descending colitis.  Patient has been admitted for further evaluation and treatment   11/22.  Patient having pain in her left lower quadrant with palpation.  Stool studies negative.  On empiric Zosyn . 11/23.  Patient feeling a little bit better.  Only a little bit sore in her left lower quadrant.  Still having diarrhea.  Patient has numerous drug allergies limiting medications for treatment of colitis.  Advance diet to solid food. 11/24.  Patient feeling better.  Still has a little tenderness and still having some diarrhea.  Wants to go home.  Switch over to Cipro  since this is the only antibiotic that she can take.

## 2024-09-09 NOTE — Assessment & Plan Note (Signed)
 On Lipitor

## 2024-09-09 NOTE — Progress Notes (Signed)
 Pharmacy Antibiotic Note  Bianca Fry is a 78 y.o. female admitted on 09/08/2024 with intra-abdominal infection.  Pharmacy has been consulted for Zosyn  dosing.  Plan: Zosyn  3.375g IV q8h (4 hour infusion).  Height: 5' 4 (162.6 cm) Weight: 59.7 kg (131 lb 9.6 oz) IBW/kg (Calculated) : 54.7  Temp (24hrs), Avg:97.9 F (36.6 C), Min:97.9 F (36.6 C), Max:97.9 F (36.6 C)  Recent Labs  Lab 09/08/24 2108  WBC 22.6*  CREATININE 0.83  LATICACIDVEN 2.1*    Estimated Creatinine Clearance: 47.5 mL/min (by C-G formula based on SCr of 0.83 mg/dL).    Allergies  Allergen Reactions   Codeine Other (See Comments)    Chest pain   Nitrofurantoin Other (See Comments)    Chest pain - hospitalization   Hydrocodone-Acetaminophen  Nausea Only   Oxycodone -Acetaminophen  Nausea Only   Percocet [Oxycodone -Acetaminophen ] Nausea And Vomiting   Propoxyphene Nausea And Vomiting   Tramadol  Nausea And Vomiting   Vicodin [Hydrocodone-Acetaminophen ] Nausea And Vomiting   Acetaminophen     Amlodipine  Other (See Comments)    Pedal edema   Boniva  [Ibandronate ] Diarrhea    Severe watery diarrhea - led to ER visit   Elmiron [Pentosan Polysulfate]     Chest pain, tremors, wheezing    Fosamax  [Alendronate ] Other (See Comments)    R hip pain   Hctz [Hydrochlorothiazide ] Other (See Comments)    headache   Metronidazole  Diarrhea, Nausea Only, Other (See Comments) and Nausea And Vomiting   Paxlovid  [Nirmatrelvir -Ritonavir ] Diarrhea    Rectal bleeding    Pentosan Polysulfate Sodium    Vancomycin  Diarrhea and Other (See Comments)    Severe abdominal pain   Clarithromycin Other (See Comments)    Abd. cramps   Penicillins Rash    Occurred at ~age 25; no anaphylactoid symptoms or SJS/TEN reactions   Has tolerated 1st (CEPHALEXIN ) and 3rd (CEFDINIR , CEFTRIAXONE ) generation cephalosporins in the past with no documented ADRs.    Prednisone  Other (See Comments)    Caused UTI   Sulfonamide Derivatives Other  (See Comments)    GI upset    Antimicrobials this admission:   >>    >>   Dose adjustments this admission:   Microbiology results:  BCx:   UCx:    Sputum:    MRSA PCR:   Thank you for allowing pharmacy to be a part of this patient's care.  Archana Eckman D 09/09/2024 12:07 AM

## 2024-09-09 NOTE — Assessment & Plan Note (Signed)
 On Protonix

## 2024-09-10 DIAGNOSIS — K529 Noninfective gastroenteritis and colitis, unspecified: Secondary | ICD-10-CM | POA: Diagnosis not present

## 2024-09-10 DIAGNOSIS — E785 Hyperlipidemia, unspecified: Secondary | ICD-10-CM | POA: Diagnosis not present

## 2024-09-10 DIAGNOSIS — N390 Urinary tract infection, site not specified: Secondary | ICD-10-CM | POA: Diagnosis not present

## 2024-09-10 DIAGNOSIS — I10 Essential (primary) hypertension: Secondary | ICD-10-CM | POA: Diagnosis not present

## 2024-09-10 MED ORDER — ACETAMINOPHEN 325 MG PO TABS: 650.0000 mg | ORAL_TABLET | Freq: Four times a day (QID) | ORAL | Status: DC | PRN

## 2024-09-10 NOTE — Plan of Care (Signed)

## 2024-09-10 NOTE — Progress Notes (Signed)
  Progress Note   Patient: Bianca Fry FMW:[id number] DOB: January 06, 1946 DOA: 09/08/2024     2 DOS: the patient was seen and examined on 09/10/2024   Brief hospital course: 78 y.o. female with medical history significant of previous colitis, anxiety disorder, coronary artery disease, diverticular disease, GERD, peripheral arterial disease, recurrent UTIs, osteoarthritis, history of shingles, hyperlipidemia, who presented to the ER with abdominal pain and nausea.  Patient also noted bright red blood per rectum.  Symptoms have been going on all day.  Patient seen in the ER and evaluated.  Vitals are stable and H&H stable.  CT abdomen and pelvis however shows acute descending colitis.  Patient has been admitted for further evaluation and treatment   11/22.  Patient having pain in her left lower quadrant with palpation.  Stool studies negative.  On empiric Zosyn . 11/23.  Patient feeling a little bit better.  Only a little bit sore in her left lower quadrant.  Still having diarrhea.  Patient has numerous drug allergies limiting medications for treatment of colitis.  Advance diet to solid food.  Assessment and Plan: Acute colitis Stool studies are negative.  On empiric Zosyn .  Advance to solid food.  Patient has 22 allergies and numerous antibiotic allergies.  My choices are limited for oral antibiotics upon going home.  She states she can take Cipro .  Recurrent UTI On prophylactic Keflex  as outpatient  Essential hypertension On Coreg  and losartan   HLD (hyperlipidemia) On Lipitor  GERD On Protonix   Anxiety On Xanax   Hyponatremia Improved        Subjective: Patient feeling little bit better.  Still has some left lower quadrant soreness.  Patient states she still having some diarrhea.  Physical Exam: Vitals:   09/09/24 1740 09/09/24 2025 09/10/24 0529 09/10/24 0757  BP: (!) 122/51 (!) 120/46 (!) 165/65 (!) 135/53  Pulse: 80 74 82 77  Resp: 18 16  16   Temp:  97.9 F (36.6 C) (!)  97.5 F (36.4 C) 98.1 F (36.7 C)  TempSrc:      SpO2: 95% 96% 96% 98%  Weight:      Height:       Physical Exam HENT:     Head: Normocephalic.  Eyes:     General: Lids are normal.     Conjunctiva/sclera: Conjunctivae normal.  Cardiovascular:     Rate and Rhythm: Normal rate and regular rhythm.     Heart sounds: Normal heart sounds, S1 normal and S2 normal.  Pulmonary:     Breath sounds: No decreased breath sounds, wheezing, rhonchi or rales.  Abdominal:     Palpations: Abdomen is soft.     Tenderness: There is abdominal tenderness in the left lower quadrant.  Musculoskeletal:     Right lower leg: No swelling.     Left lower leg: No swelling.  Skin:    General: Skin is warm.     Findings: No rash.  Neurological:     Mental Status: She is alert and oriented to person, place, and time.     Data Reviewed: No new data  Family Communication: Spoke with husband on the phone  Disposition: Status is: Inpatient Remains inpatient appropriate because: Continue IV Zosyn  today.  Patient has 22 allergies including a lot of antibiotics.  Limiting my choices for discharge.  Planned Discharge Destination: Home    Time spent: 28 minutes  Author: Charlie Patterson, MD 09/10/2024 3:58 PM  For on call review [email or web address].

## 2024-09-10 NOTE — Care Management Important Message (Signed)
 Important Message  Patient Details  Name: Bianca Fry MRN: [id number] Date of Birth: 02-Mar-1946   Important Message Given:  Yes - Medicare IM     Milianna Ericsson W, CMA 09/10/2024, 1:34 PM

## 2024-09-11 DIAGNOSIS — E876 Hypokalemia: Secondary | ICD-10-CM | POA: Diagnosis not present

## 2024-09-11 DIAGNOSIS — I10 Essential (primary) hypertension: Secondary | ICD-10-CM | POA: Diagnosis not present

## 2024-09-11 DIAGNOSIS — K529 Noninfective gastroenteritis and colitis, unspecified: Secondary | ICD-10-CM | POA: Diagnosis not present

## 2024-09-11 DIAGNOSIS — E785 Hyperlipidemia, unspecified: Secondary | ICD-10-CM | POA: Diagnosis not present

## 2024-09-11 LAB — BASIC METABOLIC PANEL WITH GFR
Anion gap: 7 (ref 5–15)
BUN: 12 mg/dL (ref 8–23)
CO2: 26 mmol/L (ref 22–32)
Calcium: 9.4 mg/dL (ref 8.9–10.3)
Chloride: 100 mmol/L (ref 98–111)
Creatinine, Ser: 0.76 mg/dL (ref 0.44–1.00)
GFR, Estimated: >60 mL/min (ref >60)
Glucose, Bld: 106 mg/dL — ABNORMAL HIGH (ref 70–99)
Potassium: 3.3 mmol/L — ABNORMAL LOW (ref 3.5–5.1)
Sodium: 134 mmol/L — ABNORMAL LOW (ref 135–145)

## 2024-09-11 LAB — CBC
HCT: 28.6 % — ABNORMAL LOW (ref 36.0–46.0)
Hemoglobin: 9.7 g/dL — ABNORMAL LOW (ref 12.0–15.0)
MCH: 31 pg (ref 26.0–34.0)
MCHC: 33.9 g/dL (ref 30.0–36.0)
MCV: 91.4 fL (ref 80.0–100.0)
Platelets: 126 K/uL — ABNORMAL LOW (ref 150–400)
RBC: 3.13 MIL/uL — ABNORMAL LOW (ref 3.87–5.11)
RDW: 13.2 % (ref 11.5–15.5)
WBC: 9.8 K/uL (ref 4.0–10.5)
nRBC: 0 % (ref 0.0–0.2)

## 2024-09-11 MED ORDER — POTASSIUM CHLORIDE CRYS ER 20 MEQ PO TBCR
40.0000 meq | EXTENDED_RELEASE_TABLET | Freq: Once | ORAL | Status: AC
  Administered 2024-09-11: 40 meq via ORAL
  Filled 2024-09-11: qty 2

## 2024-09-11 MED ORDER — CIPROFLOXACIN HCL 500 MG PO TABS: 500.0000 mg | ORAL_TABLET | Freq: Two times a day (BID) | ORAL | 0 refills | Status: AC

## 2024-09-11 NOTE — Discharge Summary (Signed)
 Physician Discharge Summary   Patient: Bianca Fry MRN: [id number] DOB: 12-Dec-1945  Admit date:     09/08/2024  Discharge date: 09/11/24  Discharge Physician: Charlie Patterson   PCP: Rilla Baller, MD   Recommendations at discharge:   Follow-up PCP 5 days  Discharge Diagnoses: Principal Problem:   Acute colitis Active Problems:   Hypokalemia   Essential hypertension   HLD (hyperlipidemia)   GERD   Hyponatremia   CAD (coronary artery disease)   Recurrent UTI   Anxiety   Hospital Course: 78 y.o. female with medical history significant of previous colitis, anxiety disorder, coronary artery disease, diverticular disease, GERD, peripheral arterial disease, recurrent UTIs, osteoarthritis, history of shingles, hyperlipidemia, who presented to the ER with abdominal pain and nausea.  Patient also noted bright red blood per rectum.  Symptoms have been going on all day.  Patient seen in the ER and evaluated.  Vitals are stable and H&H stable.  CT abdomen and pelvis however shows acute descending colitis.  Patient has been admitted for further evaluation and treatment   11/22.  Patient having pain in her left lower quadrant with palpation.  Stool studies negative.  On empiric Zosyn . 11/23.  Patient feeling a little bit better.  Only a little bit sore in her left lower quadrant.  Still having diarrhea.  Patient has numerous drug allergies limiting medications for treatment of colitis.  Advance diet to solid food. 11/24.  Patient feeling better.  Still has a little tenderness and still having some diarrhea.  Wants to go home.  Switch over to Cipro  since this is the only antibiotic that she can take.  Assessment and Plan: * Acute colitis Stool studies are negative.  On empiric Zosyn .  Advanced to solid food.  Patient has 22 allergies and numerous antibiotic allergies.  My choices are limited for oral antibiotics upon going home.  She states she can take Cipro .  5 days of Cipro  prescribed.   Other etiology could be ischemic colitis.  Hypokalemia Replaced  Essential hypertension On Coreg  and losartan   HLD (hyperlipidemia) On Lipitor  Hyponatremia Sodium 1 point less than normal range  GERD On Protonix   Recurrent UTI On prophylactic Keflex  as outpatient.  Can restart once Cipro  finished.  Anxiety On Xanax          Consultants: None Procedures performed: None Disposition: Home Diet recommendation:  Regular diet DISCHARGE MEDICATION: Allergies as of 09/11/2024       Reactions   Codeine Other (See Comments)   Chest pain   Nitrofurantoin Other (See Comments)   Chest pain - hospitalization   Hydrocodone-acetaminophen  Nausea Only   Oxycodone -acetaminophen  Nausea Only   Percocet [oxycodone -acetaminophen ] Nausea And Vomiting   Propoxyphene Nausea And Vomiting   Tramadol  Nausea And Vomiting   Vicodin [hydrocodone-acetaminophen ] Nausea And Vomiting   Acetaminophen     Amlodipine  Other (See Comments)   Pedal edema   Boniva  [ibandronate ] Diarrhea   Severe watery diarrhea - led to ER visit   Elmiron [pentosan Polysulfate]    Chest pain, tremors, wheezing    Fosamax  [alendronate ] Other (See Comments)   R hip pain   Hctz [hydrochlorothiazide ] Other (See Comments)   headache   Metronidazole  Diarrhea, Nausea Only, Other (See Comments), Nausea And Vomiting   Paxlovid  [nirmatrelvir -ritonavir ] Diarrhea   Rectal bleeding    Pentosan Polysulfate Sodium    Vancomycin  Diarrhea, Other (See Comments)   Severe abdominal pain   Clarithromycin Other (See Comments)   Abd. cramps   Penicillins Rash   Occurred  at ~age 34; no anaphylactoid symptoms or SJS/TEN reactions  Has tolerated 1st (CEPHALEXIN ) and 3rd (CEFDINIR , CEFTRIAXONE ) generation cephalosporins in the past with no documented ADRs.    Prednisone  Other (See Comments)   Caused UTI   Sulfonamide Derivatives Other (See Comments)   GI upset        Medication List     STOP taking these medications     cephALEXin  250 MG capsule Commonly known as: KEFLEX    CLINDAMYCIN  HCL PO   linaclotide 145 MCG Caps capsule Commonly known as: LINZESS   methocarbamol  500 MG tablet Commonly known as: ROBAXIN    senna 8.6 MG tablet Commonly known as: SENOKOT       TAKE these medications    acidophilus Caps capsule Take 1 capsule by mouth daily.   ALPRAZolam  0.5 MG tablet Commonly known as: XANAX  TAKE ONE TABLET BY MOUTH TWICE A DAY AS NEEDED FOR ANXIETY   aspirin  EC 81 MG tablet Take 1 tablet (81 mg total) by mouth daily. Swallow whole.   atorvastatin  10 MG tablet Commonly known as: LIPITOR TAKE ONE TABLET BY MOUTH ONE TIME DAILY   Calcium -Vitamin D  600-400 MG-UNIT Tabs Take 1 tablet by mouth daily.   carvedilol  6.25 MG tablet Commonly known as: COREG  TAKE ONE TABLET BY MOUTH TWICE A DAY WITH FOOD   cholecalciferol  25 MCG (1000 UNIT) tablet Commonly known as: VITAMIN D3 Take 1,000 Units by mouth daily.   ciprofloxacin  500 MG tablet Commonly known as: Cipro  Take 1 tablet (500 mg total) by mouth 2 (two) times daily for 5 days.   dicyclomine  10 MG capsule Commonly known as: BENTYL  Take 1 capsule (10 mg total) by mouth 2 (two) times daily as needed for spasms.   DULoxetine  20 MG capsule Commonly known as: CYMBALTA  Take 1 capsule (20 mg total) by mouth daily.   esomeprazole  40 MG capsule Commonly known as: NexIUM  Take 1 capsule (40 mg total) by mouth daily.   estradiol  0.1 MG/GM vaginal cream Commonly known as: ESTRACE  Use 1/2 g vaginally two or three times per week as needed to maintain symptom relief.   fexofenadine 180 MG tablet Commonly known as: ALLEGRA Take 180 mg by mouth daily as needed for allergies.   losartan  50 MG tablet Commonly known as: COZAAR  Take 1.5 tablets (75 mg total) by mouth daily.   ondansetron  4 MG tablet Commonly known as: ZOFRAN  TAKE 1 TABLET BY MOUTH EVERY 8 HOURS AS NEEDED FOR NAUSEA AND VOMITING   polyethylene glycol powder 17  GM/SCOOP powder Commonly known as: GLYCOLAX /MIRALAX  Take 17 g by mouth 2 (two) times daily as needed for moderate constipation (hold for diarrhea).   valACYclovir  500 MG tablet Commonly known as: VALTREX  Take one tablet (500 mg) by mouth twice a day for 3 days as needed for a genital outbreak.  Take 4 tablets (2000 mg) by mouth twice a day for 1 day as needed for an oral outbreak.        Follow-up Information     Rilla Baller, MD Follow up in 5 day(s).   Specialty: Family Medicine Contact information: [address] [place] [address] [phone]                Discharge Exam: Bianca Fry   09/08/24 2040  Weight: 59.7 kg   Physical Exam HENT:     Head: Normocephalic.  Eyes:     General: Lids are normal.     Conjunctiva/sclera: Conjunctivae normal.  Cardiovascular:  Rate and Rhythm: Normal rate and regular rhythm.     Heart sounds: Normal heart sounds, S1 normal and S2 normal.  Pulmonary:     Breath sounds: No decreased breath sounds, wheezing, rhonchi or rales.  Abdominal:     Palpations: Abdomen is soft.     Tenderness: There is abdominal tenderness in the left lower quadrant.  Musculoskeletal:     Right lower leg: No swelling.     Left lower leg: No swelling.  Skin:    General: Skin is warm.     Findings: No rash.  Neurological:     Mental Status: She is alert and oriented to person, place, and time.      Condition at discharge: stable  The results of significant diagnostics from this hospitalization (including imaging, microbiology, ancillary and laboratory) are listed below for reference.   Imaging Studies: CT ABDOMEN PELVIS W CONTRAST Result Date: 09/08/2024 CLINICAL DATA:  Left lower quadrant pain EXAM: CT ABDOMEN AND PELVIS WITH CONTRAST TECHNIQUE: Multidetector CT imaging of the abdomen and pelvis was performed using the standard protocol following bolus administration of intravenous contrast. RADIATION DOSE REDUCTION:  This exam was performed according to the departmental dose-optimization program which includes automated exposure control, adjustment of the mA and/or kV according to patient size and/or use of iterative reconstruction technique. CONTRAST:  OMNIPAQUE  IOHEXOL  300 MG/ML  SOLN COMPARISON:  CT abdomen and pelvis 03/07/2024 FINDINGS: Lower chest: No acute abnormality. Hepatobiliary: No focal liver abnormality is seen. Status post cholecystectomy. No biliary dilatation. Small amount of pneumobilia is again seen. Pancreas: Unremarkable. No pancreatic ductal dilatation or surrounding inflammatory changes. Spleen: Normal in size without focal abnormality. Adrenals/Urinary Tract: The bladder is not well visualized secondary to streak artifact in the pelvis. Subcentimeter hypodensity in the left kidney is too small to characterize, likely a cyst. Otherwise, the kidneys and adrenal glands are within normal limits. Stomach/Bowel: There is marked wall thickening and edema of the descending and sigmoid colon to the level of the rectum with mild surrounding inflammatory stranding. There is no bowel obstruction, pneumatosis or free air. The appendix is not visualized. There is a small hiatal hernia. Small bowel and stomach are otherwise within normal limits. Vascular/Lymphatic: Aortic atherosclerosis. No enlarged abdominal or pelvic lymph nodes. Reproductive: Status post hysterectomy. No adnexal masses. Other: There is trace free fluid in the pelvis. There is no focal abdominal wall hernia. No focal fluid collection identified. Musculoskeletal: L4-L5 posterior fusion hardware present. Right hip arthroplasty is also present. IMPRESSION: 1. Marked wall thickening and edema of the descending and sigmoid colon to the level of the rectum compatible with colitis. 2. Trace free fluid in the pelvis. 3. Aortic atherosclerosis. Aortic Atherosclerosis (ICD10-I70.0). Electronically Signed   By: Greig Pique M.D.   On: 09/08/2024 22:18    CT LUMBAR SPINE WO CONTRAST Result Date: 08/31/2024 CLINICAL DATA:  Back pain EXAM: CT LUMBAR SPINE WITHOUT CONTRAST TECHNIQUE: Multidetector CT imaging of the lumbar spine was performed without intravenous contrast administration. Multiplanar CT image reconstructions were also generated. RADIATION DOSE REDUCTION: This exam was performed according to the departmental dose-optimization program which includes automated exposure control, adjustment of the mA and/or kV according to patient size and/or use of iterative reconstruction technique. COMPARISON:  04/06/2024 FINDINGS: Segmentation: Rudimentary ribs at T12. Lowest well developed disc space is again designated as the L5-S1 level. Alignment: Normal. Vertebrae: Posterior and interbody fusion at L4-L5 with solid arthrodesis. No evidence of hardware loosening. Vertebral body heights are maintained without fracture.  No pathologic bone process or suspicious bone lesion. Paraspinal and other soft tissues: Aortoiliac atherosclerosis without aneurysm. No acute abnormality. Disc levels: Non fused disc heights are preserved. Mild-to-moderate facet arthropathy within the mid to lower lumbar spine. Suspect at least mild canal stenosis at the L3-L4 level. No evidence of significant foraminal stenosis at any level by CT. IMPRESSION: 1. No acute osseous abnormality of the lumbar spine. 2. Posterior and interbody fusion at L4-L5 with solid arthrodesis. No evidence of hardware loosening. 3. Suspect at least mild canal stenosis at the L3-L4 level. 4. Aortic atherosclerosis (ICD10-I70.0). Electronically Signed   By: Mabel Converse D.O.   On: 08/31/2024 09:49    Microbiology: Results for orders placed or performed during the hospital encounter of 09/08/24  Gastrointestinal Panel by PCR , Stool     Status: None   Collection Time: 09/09/24  7:19 AM   Specimen: Stool  Result Value Ref Range Status   Campylobacter species NOT DETECTED NOT DETECTED Final   Plesimonas  shigelloides NOT DETECTED NOT DETECTED Final   Salmonella species NOT DETECTED NOT DETECTED Final   Yersinia enterocolitica NOT DETECTED NOT DETECTED Final   Vibrio species NOT DETECTED NOT DETECTED Final   Vibrio cholerae NOT DETECTED NOT DETECTED Final   Enteroaggregative E coli (EAEC) NOT DETECTED NOT DETECTED Final   Enteropathogenic E coli (EPEC) NOT DETECTED NOT DETECTED Final   Enterotoxigenic E coli (ETEC) NOT DETECTED NOT DETECTED Final   Shiga like toxin producing E coli (STEC) NOT DETECTED NOT DETECTED Final   Shigella/Enteroinvasive E coli (EIEC) NOT DETECTED NOT DETECTED Final   Cryptosporidium NOT DETECTED NOT DETECTED Final   Cyclospora cayetanensis NOT DETECTED NOT DETECTED Final   Entamoeba histolytica NOT DETECTED NOT DETECTED Final   Giardia lamblia NOT DETECTED NOT DETECTED Final   Adenovirus F40/41 NOT DETECTED NOT DETECTED Final   Astrovirus NOT DETECTED NOT DETECTED Final   Norovirus GI/GII NOT DETECTED NOT DETECTED Final   Rotavirus A NOT DETECTED NOT DETECTED Final   Sapovirus (I, II, IV, and V) NOT DETECTED NOT DETECTED Final    Comment: Performed at [facility], [address] [place], [place] [address]  C Difficile Quick Screen w PCR reflex     Status: Abnormal   Collection Time: 09/09/24  7:19 AM   Specimen: STOOL  Result Value Ref Range Status   C Diff antigen POSITIVE (A) NEGATIVE Final   C Diff toxin NEGATIVE NEGATIVE Final   C Diff interpretation Results are indeterminate. See PCR results.  Final    Comment: Performed at [facility], [address] [place], [place] [address]  C. Diff by PCR, Reflexed     Status: None   Collection Time: 09/09/24  7:19 AM  Result Value Ref Range Status   Toxigenic C. Difficile by PCR NEGATIVE NEGATIVE Final    Comment: Patient is colonized with non toxigenic C. difficile. May not need treatment unless significant symptoms are present.   Hypervirulent Strain PRESUMPTIVE NEGATIVE PRESUMPTIVE  NEGATIVE Final    Comment: Performed at [facility], [address] [place], [place] [address]    Labs: CBC: Recent Labs  Lab 09/08/24 2108 09/09/24 0500 09/11/24 0408  WBC 22.6* 17.0* 9.8  NEUTROABS 19.4*  --   --   HGB 13.5 11.6* 9.7*  HCT 40.8 33.9* 28.6*  MCV 91.3 90.4 91.4  PLT 177 164 126*   Basic Metabolic Panel: Recent Labs  Lab 09/08/24 2108 09/09/24 0500 09/11/24 0408  NA 134*  135 134*  K 4.4 4.3 3.3*  CL 99 100 100  CO2 26 27 26   GLUCOSE 179* 123* 106*  BUN 18 19 12   CREATININE 0.83 0.85 0.76  CALCIUM  10.4* 9.8 9.4   Liver Function Tests: Recent Labs  Lab 09/08/24 2108 09/09/24 0500  AST 17 14*  ALT 12 10  ALKPHOS 153* 118  BILITOT 0.6 0.3  PROT 6.3* 5.5*  ALBUMIN 4.0 3.4*   CBG: No results for input(s): GLUCAP in the last 168 hours.  Discharge time spent: greater than 30 minutes.  Signed: Charlie Patterson, MD [facility] 09/11/2024

## 2024-09-11 NOTE — Plan of Care (Signed)

## 2024-09-11 NOTE — Progress Notes (Signed)
 All discharge requirements met.

## 2024-09-11 NOTE — Assessment & Plan Note (Signed)
 Replaced

## 2024-09-11 NOTE — Plan of Care (Signed)

## 2024-09-12 ENCOUNTER — Telehealth: Payer: Self-pay

## 2024-09-12 NOTE — Transitions of Care (Post Inpatient/ED Visit) (Signed)
 09/12/2024  Name: Bianca Fry MRN: [id number] DOB: 11-05-1945  Today's TOC FU Call Status: Today's TOC FU Call Status:: Successful TOC FU Call Completed Unsuccessful Call (1st Attempt) Date: 09/12/24 [facility] FU Call Complete Date: 09/12/24  Patient's Name and Date of Birth confirmed. Name, DOB  Transition Care Management Follow-up Telephone Call Date of Discharge: 09/11/24 Discharge Facility: [facility]) Type of Discharge: Inpatient Admission Primary Inpatient Discharge Diagnosis:: gastroenteritis How have you been since you were released from the hospital?: Better Any questions or concerns?: No  Items Reviewed: Did you receive and understand the discharge instructions provided?: Yes Medications obtained,verified, and reconciled?: Yes (Medications Reviewed) Any new allergies since your discharge?: No Dietary orders reviewed?: Yes Do you have support at home?: Yes People in Home [RPT]: spouse  Medications Reviewed Today: Medications Reviewed Today     Reviewed by Bianca Pan, LPN (Licensed Practical Nurse) on 09/12/24 at 1114  Med List Status: <None>   Medication Order Taking? Sig Documenting Provider Last Dose Status Informant  acidophilus (RISAQUAD) CAPS capsule [id number] Yes Take 1 capsule by mouth daily. Bianca Baller, MD  Active Self, Pharmacy Records, Multiple Informants           Med Note Bianca Fry   Sat Sep 09, 2024  9:58 AM) Need new RX  ALPRAZolam  (XANAX ) 0.5 MG tablet [id number] Yes TAKE ONE TABLET BY MOUTH TWICE A DAY AS NEEDED FOR ANXIETY Bianca Baller, MD  Active Self, Pharmacy Records, Multiple Informants  aspirin  EC 81 MG tablet [id number] Yes Take 1 tablet (81 mg total) by mouth daily. Swallow whole. Bianca Baller, MD  Active Self, Pharmacy Records, Multiple Informants  atorvastatin  (LIPITOR) 10 MG tablet [id number] Yes TAKE ONE TABLET BY MOUTH ONE TIME DAILY Fry, Bianca J, MD  Active Self, Pharmacy Records,  Multiple Informants  Calcium  Carb-Cholecalciferol  (CALCIUM -VITAMIN D ) 600-400 MG-UNIT TABS [id number] Yes Take 1 tablet by mouth daily. [provider]  Active Self, Pharmacy Records, Multiple Informants  carvedilol  (COREG ) 6.25 MG tablet [id number] Yes TAKE ONE TABLET BY MOUTH TWICE A DAY WITH FOOD Fry, Bianca J, MD  Active Self, Pharmacy Records, Multiple Informants  cholecalciferol  (VITAMIN D ) 25 MCG (1000 UNIT) tablet [id number] Yes Take 1,000 Units by mouth daily. [provider]  Active Self, Pharmacy Records, Multiple Informants  ciprofloxacin  (CIPRO ) 500 MG tablet [id number] Yes Take 1 tablet (500 mg total) by mouth 2 (two) times daily for 5 days. Bianca Ade, MD  Active   dicyclomine  (BENTYL ) 10 MG capsule [id number] Yes Take 1 capsule (10 mg total) by mouth 2 (two) times daily as needed for spasms. Bianca Baller, MD  Active Self, Pharmacy Records, Multiple Informants  DULoxetine  (CYMBALTA ) 20 MG capsule [id number] Yes Take 1 capsule (20 mg total) by mouth daily. Bianca Baller, MD  Active Self, Pharmacy Records, Multiple Informants  esomeprazole  (NEXIUM ) 40 MG capsule [id number] Yes Take 1 capsule (40 mg total) by mouth daily. Bianca Baller, MD  Active Self, Pharmacy Records, Multiple Informants  estradiol  (ESTRACE ) 0.1 MG/GM vaginal cream [id number] Yes Use 1/2 g vaginally two or three times per week as needed to maintain symptom relief. Amundson C Fry, Bianca E, MD  Active Self, Pharmacy Records, Multiple Informants  fexofenadine (ALLEGRA) 180 MG tablet [id number] Yes Take 180 mg by mouth daily as needed for allergies. [provider]  Active Self, Pharmacy Records, Multiple Informants           Med Note Bianca, ARLYNE Fry   Fri Nov 12, 2023  3:07 PM)    losartan  (COZAAR ) 50 MG tablet [id number] Yes Take 1.5 tablets (75 mg total) by mouth daily. Bianca Baller, MD  Active Self, Pharmacy Records, Multiple Informants  ondansetron  (ZOFRAN ) 4 MG tablet  [id number] Yes TAKE 1 TABLET BY MOUTH EVERY 8 HOURS AS NEEDED FOR NAUSEA AND VOMITING Bianca Baller, MD  Active Self, Pharmacy Records, Multiple Informants  polyethylene glycol powder (GLYCOLAX /MIRALAX ) 17 GM/SCOOP powder [id number] Yes Take 17 g by mouth 2 (two) times daily as needed for moderate constipation (hold for diarrhea). Bianca Baller, MD  Active Self, Pharmacy Records, Multiple Informants           Med Note Bianca Bianca Fry HERO   Sat Sep 09, 2024 10:01 AM) PRN  valACYclovir  (VALTREX ) 500 MG tablet [id number] Yes Take one tablet (500 mg) by mouth twice a day for 3 days as needed for a genital outbreak.  Take 4 tablets (2000 mg) by mouth twice a day for 1 day as needed for an oral outbreak. Bianca JAYSON Nikki Bobie FORBES, MD  Active Self, Pharmacy Records, Multiple Informants           Med Note Bianca Bianca Fry HERO   Sat Sep 09, 2024 10:02 AM) PRN            Home Care and Equipment/Supplies: Were Home Health Services Ordered?: NA Any new equipment or medical supplies ordered?: NA  Functional Questionnaire: Do you need assistance with bathing/showering or dressing?: No Do you need assistance with meal preparation?: No Do you need assistance with eating?: No Do you have difficulty maintaining continence: No Do you need assistance with getting out of bed/getting out of a chair/moving?: No Do you have difficulty managing or taking your medications?: No  Follow up appointments reviewed: PCP Follow-up appointment confirmed?: Yes Date of PCP follow-up appointment?: 09/18/24 Follow-up Provider: [facility] Follow-up appointment confirmed?: NA Do you need transportation to your follow-up appointment?: No Do you understand care options if your condition(s) worsen?: Yes-patient verbalized understanding    SIGNATURE [name], LPN [facility] Nurse Health Advisor Direct Dial [phone]

## 2024-09-12 NOTE — Transitions of Care (Post Inpatient/ED Visit) (Signed)
   09/12/2024  Name: Bianca Fry MRN: [id number] DOB: [birth year]  Today's TOC FU Call Status: Today's TOC FU Call Status:: Unsuccessful Call (1st Attempt) Unsuccessful Call (1st Attempt) Date: 09/12/24  Attempted to reach the patient regarding the most recent Inpatient/ED visit.  Follow Up Plan: Additional outreach attempts will be made to reach the patient to complete the Transitions of Care (Post Inpatient/ED visit) call.   Signature [name], LPN [facility] Nurse Health Advisor Direct Dial [phone]

## 2024-09-18 ENCOUNTER — Inpatient Hospital Stay: Admitting: Family Medicine

## 2024-09-19 ENCOUNTER — Telehealth: Payer: Self-pay | Admitting: Family Medicine

## 2024-09-19 DIAGNOSIS — R112 Nausea with vomiting, unspecified: Secondary | ICD-10-CM

## 2024-09-19 DIAGNOSIS — F419 Anxiety disorder, unspecified: Secondary | ICD-10-CM

## 2024-09-20 DIAGNOSIS — M5416 Radiculopathy, lumbar region: Secondary | ICD-10-CM | POA: Diagnosis not present

## 2024-09-20 DIAGNOSIS — M533 Sacrococcygeal disorders, not elsewhere classified: Secondary | ICD-10-CM | POA: Diagnosis not present

## 2024-09-20 DIAGNOSIS — M5441 Lumbago with sciatica, right side: Secondary | ICD-10-CM | POA: Diagnosis not present

## 2024-09-20 DIAGNOSIS — G8929 Other chronic pain: Secondary | ICD-10-CM | POA: Diagnosis not present

## 2024-09-21 NOTE — Telephone Encounter (Signed)
 Unable to reach pt at this time, left voicemail instructing pt to call back when able

## 2024-09-21 NOTE — Telephone Encounter (Signed)
 ERx.  She missed hosp f/u visit 12/1 - can we call to ensure she continues improving from recent colitis hospitalization?

## 2024-09-26 NOTE — Telephone Encounter (Signed)
 Noted! Thank you

## 2024-10-04 ENCOUNTER — Ambulatory Visit

## 2024-10-13 ENCOUNTER — Ambulatory Visit: Payer: Self-pay

## 2024-10-13 NOTE — Telephone Encounter (Signed)
 FYI Only or Action Required?: FYI only for provider: appointment scheduled on 12/29.  Patient was last seen in primary care on 08/22/2024 by Rilla Baller, MD.  Called Nurse Triage reporting Skin Problem and Tailbone Pain.  Symptoms began several years ago.  Interventions attempted: OTC medications: Aquaphor.  Symptoms are: unchanged.  Triage Disposition: See PCP When Office is Open (Within 3 Days)  Patient/caregiver understands and will follow disposition?: Yes        Copied from CRM #[id number]. Topic: Clinical - Red Word Triage >> Oct 13, 2024 11:10 AM Adelita E wrote: [facility] that prompted transfer to Nurse Triage: Buttock pain going on for 3-4 weeks and has been worsening. Reason for Disposition  Nursing judgment or information in reference  Answer Assessment - Initial Assessment Questions 1. REASON FOR CALL: What is your main concern right now?   Patient called in to report dry or hard skin in crease of buttock, near tailbone. She stated some tailbone pain is present as well. She is unsure why this is present. She stated this has been ongoing for 3 years. She mentioned providers are aware of this issue, but no significant findings found. She said there is no wound or rash or skin tear. She is applying Aguaphor to moisturize the area per the dermatologist.  Protocols used: No Guideline Available-A-AH

## 2024-10-16 ENCOUNTER — Ambulatory Visit: Admitting: Family Medicine

## 2024-10-16 ENCOUNTER — Encounter: Payer: Self-pay | Admitting: Family Medicine

## 2024-10-16 VITALS — BP 120/62 | HR 69 | Temp 97.7°F | Wt 120.2 lb

## 2024-10-16 DIAGNOSIS — M533 Sacrococcygeal disorders, not elsewhere classified: Secondary | ICD-10-CM

## 2024-10-16 DIAGNOSIS — K529 Noninfective gastroenteritis and colitis, unspecified: Secondary | ICD-10-CM

## 2024-10-16 DIAGNOSIS — L89152 Pressure ulcer of sacral region, stage 2: Secondary | ICD-10-CM

## 2024-10-16 DIAGNOSIS — F4321 Adjustment disorder with depressed mood: Secondary | ICD-10-CM

## 2024-10-16 NOTE — Patient Instructions (Addendum)
 Try taper off cymbalta  - every other day for 1 week then stop.  You do have an erosion to sacrum - likely a pressure sore to the area Keep clean and dry, dressing changes daily to every other day, let us  know if not healing/improving.  Return in 1 week for wound check.

## 2024-10-16 NOTE — Telephone Encounter (Signed)
 Seeing today

## 2024-10-16 NOTE — Progress Notes (Unsigned)
 " Ph: [phone] Fax: [phone]   Patient ID: Bianca Fry, female    DOB: Sep 12, 1946, 78 y.o.   MRN: [id number]  This visit was conducted in person.  BP 120/62 (BP Location: Right Arm, Cuff Size: Normal)   Pulse 69   Temp 97.7 F (36.5 C) (Oral)   Wt 120 lb 3.2 oz (54.5 kg)   LMP 10/20/1979   SpO2 98%   BMI 20.63 kg/m   BP Readings from Last 3 Encounters:  10/16/24 120/62  09/11/24 130/78  08/22/24 130/60    CC: worsening buttock pain, dry skin patch Subjective:   HPI: Bianca Fry is a 78 y.o. female presenting on 10/16/2024 for Acute Visit (Pt has dry/ hard skin in crease of buttock, near tailbone. Onset 3 years but having pain that is worsening since 3-4 weeks since being in the hospital/Pt says pain is becoming unbearable, making it unable to lay, sit, and sometimes hard to walk)   Husband recently had ankle reconstruction surgery. She has been helping to care for him.   Chronic coccydynia since ~2022, acutely worse after R hip replacement surgery 11/2023. Also with chronic dry skin overlying this area - has seen GYN and dermatology.  Offered ortho vs PFPT referral - she preferred to f/u with PM&R. Last saw Dr Dodson of PM&R 09/20/2024 - rec against further spine injections, rec f/u with ortho ([facility]) for possible hip etiology, released from care.  PFPT previously didn't help her chronic cystitis symptoms.   Has been seeing [facility] Dr Dodson for chronic low back pain with R lumbar radiculitis s/p B L5/S1 TFESI 05/2024 with 50% improvement (actually left 3rd/4th toe pain/numbness improved, low back pain improved after SI joint pain). H/o L4/5 fusion 2009.   Latest hospitalization 09/08/2024 for acute descending colitis, likely ischemic, treated with IV zosyn  followed by oral cipro  course. Missed hospital f/u 09/18/2024.  H/o diverticulitis and ischemic colitis and IBS alternating D/C - noted improvement after stopping oral iron  replacement.   Tailbone again  started hurting since latest hospitalization. Again external skin irritation very painful as well. She has been using aquaphor and neosporin to external sacral skin. Over the past few days tailbone pain is improved.   BP low - she feels more tired as well.  Cymbalta  recently started - she feels less emotional than previously however would like to taper off.   CIDP saw Dr Tobie 04/2024 - referred to [facility].      Relevant past medical, surgical, family and social history reviewed and updated as indicated. Interim medical history since our last visit reviewed. Allergies and medications reviewed and updated. Outpatient Medications Prior to Visit  Medication Sig Dispense Refill   acidophilus (RISAQUAD) CAPS capsule Take 1 capsule by mouth daily. 90 capsule 1   ALPRAZolam  (XANAX ) 0.5 MG tablet Take 1 tablet by mouth twice daily as needed for anxiety 40 tablet 0   aspirin  EC 81 MG tablet Take 1 tablet (81 mg total) by mouth daily. Swallow whole.     atorvastatin  (LIPITOR) 10 MG tablet TAKE ONE TABLET BY MOUTH ONE TIME DAILY 90 tablet 3   carvedilol  (COREG ) 6.25 MG tablet TAKE ONE TABLET BY MOUTH TWICE A DAY WITH FOOD 180 tablet 3   cephALEXin  (KEFLEX ) 250 MG capsule Take 250 mg by mouth at bedtime.     cholecalciferol  (VITAMIN D ) 25 MCG (1000 UNIT) tablet Take 1,000 Units by mouth daily.     dicyclomine  (BENTYL ) 10 MG capsule Take 1  capsule (10 mg total) by mouth 2 (two) times daily as needed for spasms. 90 capsule 1   DULoxetine  (CYMBALTA ) 20 MG capsule Take 1 capsule (20 mg total) by mouth daily. 30 capsule 3   esomeprazole  (NEXIUM ) 40 MG capsule Take 1 capsule (40 mg total) by mouth daily. 90 capsule 3   estradiol  (ESTRACE ) 0.1 MG/GM vaginal cream Use 1/2 g vaginally two or three times per week as needed to maintain symptom relief. (Patient taking differently: as needed. Use 1/2 g vaginally two or three times per week as needed to maintain symptom relief.) 42.5 g 2   fexofenadine (ALLEGRA) 180  MG tablet Take 180 mg by mouth daily as needed for allergies.     losartan  (COZAAR ) 50 MG tablet Take 1.5 tablets (75 mg total) by mouth daily. 135 tablet 3   ofloxacin (OCUFLOX) 0.3 % ophthalmic solution Place 1 drop into the right eye 4 (four) times daily.     ondansetron  (ZOFRAN ) 4 MG tablet TAKE 1 TABLET BY MOUTH EVERY 8 HOURS AS NEEDED FOR NAUSEA AND VOMITING 20 tablet 0   polyethylene glycol powder (GLYCOLAX /MIRALAX ) 17 GM/SCOOP powder Take 17 g by mouth 2 (two) times daily as needed for moderate constipation (hold for diarrhea).     valACYclovir  (VALTREX ) 500 MG tablet Take one tablet (500 mg) by mouth twice a day for 3 days as needed for a genital outbreak.  Take 4 tablets (2000 mg) by mouth twice a day for 1 day as needed for an oral outbreak. 30 tablet 2   Calcium  Carb-Cholecalciferol  (CALCIUM -VITAMIN D ) 600-400 MG-UNIT TABS Take 1 tablet by mouth daily. (Patient not taking: Reported on 10/16/2024)     No facility-administered medications prior to visit.     Per HPI unless specifically indicated in ROS section below Review of Systems  Objective:  BP 120/62 (BP Location: Right Arm, Cuff Size: Normal)   Pulse 69   Temp 97.7 F (36.5 C) (Oral)   Wt 120 lb 3.2 oz (54.5 kg)   LMP 10/20/1979   SpO2 98%   BMI 20.63 kg/m   Wt Readings from Last 3 Encounters:  10/16/24 120 lb 3.2 oz (54.5 kg)  09/08/24 131 lb 9.6 oz (59.7 kg)  08/22/24 126 lb (57.2 kg)      Physical Exam Vitals and nursing note reviewed.  Constitutional:      Appearance: Normal appearance.     Comments: Tired appearing  HENT:     Head: Normocephalic and atraumatic.  Musculoskeletal:     Right lower leg: No edema.     Left lower leg: No edema.     Comments:  No pain midline spine No paraspinous mm tenderness Neg SLR bilaterally. No pain with int/ext rotation at hip.  Skin:    General: Skin is warm and dry.     Findings: Lesion present. No rash.         Comments: Small sacral pressure erosion with 2  smaller papular growths on both sides of erosion  Neurological:     Mental Status: She is alert.  Psychiatric:        Mood and Affect: Mood normal.        Behavior: Behavior normal.        Assessment & Plan:   Problem List Items Addressed This Visit     Adjustment disorder with depressed mood - Primary   Tried cymbalta  for about a month, feels stressful period is improving after husband completed surgery.  Desires to try tapering  off cymbalta  - will drop to every other day x1 wk then stop.  She continues xanax  use.       Coccyx pain   Chronic coccydynia present since 2021 . Tailbone pain recently better See above re sacral pressure sore.       Acute colitis   Recurrent likely ischemic colitis 08/2204 , records reviewed. Had significant symptoms including diarrhea and vomiting.       Sacral pressure sore   Sore with open erosion consistent with stage 2 pressure sore. Wound dressed with triple abx ointment, gauze and bandaid.  Supportive measures reviewed including frequent repositioning and bandage with cushioning, home care reviewed.         No orders of the defined types were placed in this encounter.   No orders of the defined types were placed in this encounter.   Patient Instructions  Try taper off cymbalta  - every other day for 1 week then stop.  You do have an erosion to sacrum - likely a pressure sore to the area Keep clean and dry, dressing changes daily to every other day, let us  know if not healing/improving.  Return in 1 week for wound check.   Follow up plan: Return in about 1 week (around 10/23/2024), or if symptoms worsen or fail to improve, for follow up visit.  Anton Blas, MD   "

## 2024-10-17 DIAGNOSIS — L89159 Pressure ulcer of sacral region, unspecified stage: Secondary | ICD-10-CM | POA: Insufficient documentation

## 2024-10-17 NOTE — Assessment & Plan Note (Signed)
 Chronic coccydynia present since 2021 . Tailbone pain recently better See above re sacral pressure sore.

## 2024-10-17 NOTE — Assessment & Plan Note (Signed)
 Tried cymbalta  for about a month, feels stressful period is improving after husband completed surgery.  Desires to try tapering off cymbalta  - will drop to every other day x1 wk then stop.  She continues xanax  use.

## 2024-10-17 NOTE — Assessment & Plan Note (Signed)
 Recurrent likely ischemic colitis 08/2204 , records reviewed. Had significant symptoms including diarrhea and vomiting.

## 2024-10-17 NOTE — Assessment & Plan Note (Signed)
 Sore with open erosion consistent with stage 2 pressure sore. Wound dressed with triple abx ointment, gauze and bandaid.  Supportive measures reviewed including frequent repositioning and bandage with cushioning, home care reviewed.

## 2024-10-23 ENCOUNTER — Ambulatory Visit: Admitting: Family Medicine

## 2024-10-23 ENCOUNTER — Encounter: Payer: Self-pay | Admitting: Family Medicine

## 2024-10-23 VITALS — BP 132/64 | HR 73 | Temp 97.0°F | Ht 64.0 in | Wt 121.4 lb

## 2024-10-23 DIAGNOSIS — F419 Anxiety disorder, unspecified: Secondary | ICD-10-CM

## 2024-10-23 DIAGNOSIS — F4321 Adjustment disorder with depressed mood: Secondary | ICD-10-CM

## 2024-10-23 DIAGNOSIS — L89152 Pressure ulcer of sacral region, stage 2: Secondary | ICD-10-CM | POA: Diagnosis not present

## 2024-10-23 MED ORDER — ALPRAZOLAM 0.5 MG PO TABS: 0.5000 mg | ORAL_TABLET | Freq: Two times a day (BID) | ORAL | 0 refills | Status: AC | PRN

## 2024-10-23 NOTE — Assessment & Plan Note (Addendum)
 Ongoing, but smaller.  Reviewed home wound care measures specifically recommend she buy foam dressing for cushioning, as well as importance of good protein intake for wound healing. RTC 2 wks wound check, if poorly healing will refer to wound clinic.

## 2024-10-23 NOTE — Progress Notes (Signed)
 PRESLEY GORA                                          MRN: [id number]   10/23/2024   The [facility] Quality Team Specialist reviewed this patient medical record for the purposes of chart review for care gap closure. The following were reviewed: abstraction for care gap closure-controlling blood pressure.    [facility] Quality Team

## 2024-10-23 NOTE — Progress Notes (Signed)
 " Ph: [phone] Fax: [phone]   Patient ID: Bianca Fry, female    DOB: 1946-03-29, 79 y.o.   MRN: [id number]  This visit was conducted in person.  BP 132/64 (BP Location: Right Arm, Cuff Size: Normal)   Pulse 73   Temp (!) 97 F (36.1 C) (Oral)   Ht 5' 4 (1.626 m)   Wt 121 lb 6.4 oz (55.1 kg)   LMP 10/20/1979   SpO2 98%   BMI 20.84 kg/m   BP Readings from Last 3 Encounters:  10/23/24 132/64  10/16/24 120/62  09/11/24 130/78   CC: 1 wk f/u visit  Subjective:   HPI: Bianca Fry is a 79 y.o. female presenting on 10/23/2024 for Medical Management of Chronic Issues (1 week F/U for Sore with open erosion consistent with stage 2 pressure sore./Wound last dressed at 12/29 appt with triple abx ointment, gauze and bandaid. //Pt reports not feeling any better but no worse//Pt is worried Cymbalta  is making her legs weak/ swell)   See prior note for details.  Diagnosed with stage 2 sacral pressure sore last week recommended treatment with triple abx and cushioned dressing changes daily.  She feels this has not really helped.    Cymbalta  - recent commencement for situational depression in setting of caring for husband after his ankle surgery - last visit we started slow taper off by every other day dosing. She feels medication may be causing leg swelling, leg weakness. Will fully stop.   Recent hospitalization for acute colitis treated with abx (IV zosyn  followed by PO cipro ).  H/o diverticulitis and ischemic colitis and IBS alternating D/C   She continues keflex  250mg  nightly for UTI ppx     Relevant past medical, surgical, family and social history reviewed and updated as indicated. Interim medical history since our last visit reviewed. Allergies and medications reviewed and updated. Outpatient Medications Prior to Visit  Medication Sig Dispense Refill   acidophilus (RISAQUAD) CAPS capsule Take 1 capsule by mouth daily. 90 capsule 1   aspirin  EC 81 MG tablet Take 1  tablet (81 mg total) by mouth daily. Swallow whole.     atorvastatin  (LIPITOR) 10 MG tablet TAKE ONE TABLET BY MOUTH ONE TIME DAILY 90 tablet 3   carvedilol  (COREG ) 6.25 MG tablet TAKE ONE TABLET BY MOUTH TWICE A DAY WITH FOOD 180 tablet 3   cholecalciferol  (VITAMIN D ) 25 MCG (1000 UNIT) tablet Take 1,000 Units by mouth daily.     dicyclomine  (BENTYL ) 10 MG capsule Take 1 capsule (10 mg total) by mouth 2 (two) times daily as needed for spasms. 90 capsule 1   esomeprazole  (NEXIUM ) 40 MG capsule Take 1 capsule (40 mg total) by mouth daily. 90 capsule 3   estradiol  (ESTRACE ) 0.1 MG/GM vaginal cream Use 1/2 g vaginally two or three times per week as needed to maintain symptom relief. (Patient taking differently: as needed. Use 1/2 g vaginally two or three times per week as needed to maintain symptom relief.) 42.5 g 2   fexofenadine (ALLEGRA) 180 MG tablet Take 180 mg by mouth daily as needed for allergies.     losartan  (COZAAR ) 50 MG tablet Take 1.5 tablets (75 mg total) by mouth daily. 135 tablet 3   ondansetron  (ZOFRAN ) 4 MG tablet TAKE 1 TABLET BY MOUTH EVERY 8 HOURS AS NEEDED FOR NAUSEA AND VOMITING 20 tablet 0   polyethylene glycol powder (GLYCOLAX /MIRALAX ) 17 GM/SCOOP powder Take 17 g by mouth 2 (two) times daily as needed  for moderate constipation (hold for diarrhea).     valACYclovir  (VALTREX ) 500 MG tablet Take one tablet (500 mg) by mouth twice a day for 3 days as needed for a genital outbreak.  Take 4 tablets (2000 mg) by mouth twice a day for 1 day as needed for an oral outbreak. 30 tablet 2   ALPRAZolam  (XANAX ) 0.5 MG tablet Take 1 tablet by mouth twice daily as needed for anxiety 40 tablet 0   DULoxetine  (CYMBALTA ) 20 MG capsule Take 1 capsule (20 mg total) by mouth daily. (Patient taking differently: Take 20 mg by mouth every other day.) 30 capsule 3   Calcium  Carb-Cholecalciferol  (CALCIUM -VITAMIN D ) 600-400 MG-UNIT TABS Take 1 tablet by mouth daily. (Patient not taking: Reported on  10/16/2024)     cephALEXin  (KEFLEX ) 250 MG capsule Take 250 mg by mouth at bedtime.     ofloxacin (OCUFLOX) 0.3 % ophthalmic solution Place 1 drop into the right eye 4 (four) times daily.     No facility-administered medications prior to visit.     Per HPI unless specifically indicated in ROS section below Review of Systems  Objective:  BP 132/64 (BP Location: Right Arm, Cuff Size: Normal)   Pulse 73   Temp (!) 97 F (36.1 C) (Oral)   Ht 5' 4 (1.626 m)   Wt 121 lb 6.4 oz (55.1 kg)   LMP 10/20/1979   SpO2 98%   BMI 20.84 kg/m   Wt Readings from Last 3 Encounters:  10/23/24 121 lb 6.4 oz (55.1 kg)  10/16/24 120 lb 3.2 oz (54.5 kg)  09/08/24 131 lb 9.6 oz (59.7 kg)      Physical Exam Vitals and nursing note reviewed.  Constitutional:      Appearance: Normal appearance. She is not ill-appearing.  Skin:    General: Skin is warm and dry.     Findings: Lesion and wound present.     Comments: Small subcm shallow erosion to mid sacrum with hard areas to skin bilateral to wound   Neurological:     Mental Status: She is alert.          Wound care: wound dressed with triple abx ointment and covered with gauze and bandage for cushioning.    Assessment & Plan:   Problem List Items Addressed This Visit     Adjustment disorder with depressed mood   Stop cymbalta .  She notes many surgeries are in the future for her husband which is stressful.  Consider wellbutrin vs other if further medication desired for mood.       Anxiety   Xanax  refilled per pt request.       Relevant Medications   ALPRAZolam  (XANAX ) 0.5 MG tablet   Sacral pressure sore - Primary   Ongoing, but smaller.  Reviewed home wound care measures specifically recommend she buy foam dressing for cushioning, as well as importance of good protein intake for wound healing. RTC 2 wks wound check, if poorly healing will refer to wound clinic.         Meds ordered this encounter  Medications   ALPRAZolam   (XANAX ) 0.5 MG tablet    Sig: Take 1 tablet (0.5 mg total) by mouth 2 (two) times daily as needed. for anxiety    Dispense:  40 tablet    Refill:  0    No orders of the defined types were placed in this encounter.   Patient Instructions  Ok to fully stop cymbalta .  Continue daily dressing changes with antibiotic  ointment and buy foam dressing at pharmacy. If you get foam dressing, may change dressing every 2-3 days.  As area heals, you can transition to regular desitin barrier cream use.  Continue [employer] protein shake and 3 meals/day.  Let us  know if not improving with this for wound clinic referral.  Return in 2 weeks for wound check.   Follow up plan: Return in about 2 weeks (around 11/06/2024), or if symptoms worsen or fail to improve, for follow up visit.  Anton Blas, MD   "

## 2024-10-23 NOTE — Patient Instructions (Addendum)
 Ok to fully stop cymbalta .  Continue daily dressing changes with antibiotic ointment and buy foam dressing at pharmacy. If you get foam dressing, may change dressing every 2-3 days.  As area heals, you can transition to regular desitin barrier cream use.  Continue [employer] Power protein shake and 3 meals/day.  Let us  know if not improving with this for wound clinic referral.  Return in 2 weeks for wound check.

## 2024-10-23 NOTE — Assessment & Plan Note (Addendum)
 Stop cymbalta .  She notes many surgeries are in the future for her husband which is stressful.  Consider wellbutrin vs other if further medication desired for mood.

## 2024-10-23 NOTE — Assessment & Plan Note (Signed)
 Xanax  refilled per pt request.

## 2024-11-03 ENCOUNTER — Emergency Department

## 2024-11-03 ENCOUNTER — Other Ambulatory Visit: Payer: Self-pay

## 2024-11-03 ENCOUNTER — Emergency Department: Admission: EM | Admit: 2024-11-03 | Discharge: 2024-11-03 | Disposition: A | Discharge status: Home / Self Care

## 2024-11-03 DIAGNOSIS — Y9389 Activity, other specified: Secondary | ICD-10-CM | POA: Diagnosis not present

## 2024-11-03 DIAGNOSIS — Y92009 Unspecified place in unspecified non-institutional (private) residence as the place of occurrence of the external cause: Secondary | ICD-10-CM | POA: Diagnosis not present

## 2024-11-03 DIAGNOSIS — S0083XA Contusion of other part of head, initial encounter: Secondary | ICD-10-CM | POA: Insufficient documentation

## 2024-11-03 DIAGNOSIS — S0990XA Unspecified injury of head, initial encounter: Secondary | ICD-10-CM

## 2024-11-03 DIAGNOSIS — I10 Essential (primary) hypertension: Secondary | ICD-10-CM | POA: Diagnosis not present

## 2024-11-03 DIAGNOSIS — S161XXA Strain of muscle, fascia and tendon at neck level, initial encounter: Secondary | ICD-10-CM | POA: Diagnosis not present

## 2024-11-03 DIAGNOSIS — W01198A Fall on same level from slipping, tripping and stumbling with subsequent striking against other object, initial encounter: Secondary | ICD-10-CM | POA: Insufficient documentation

## 2024-11-03 DIAGNOSIS — M545 Low back pain, unspecified: Secondary | ICD-10-CM | POA: Diagnosis not present

## 2024-11-03 DIAGNOSIS — S199XXA Unspecified injury of neck, initial encounter: Secondary | ICD-10-CM | POA: Diagnosis present

## 2024-11-03 DIAGNOSIS — I2510 Atherosclerotic heart disease of native coronary artery without angina pectoris: Secondary | ICD-10-CM | POA: Insufficient documentation

## 2024-11-03 DIAGNOSIS — M25561 Pain in right knee: Secondary | ICD-10-CM | POA: Diagnosis not present

## 2024-11-03 LAB — CBC WITH DIFFERENTIAL/PLATELET
Abs Immature Granulocytes: 0.02 K/uL (ref 0.00–0.07)
Basophils Absolute: 0 K/uL (ref 0.0–0.1)
Basophils Relative: 0 %
Eosinophils Absolute: 0.2 K/uL (ref 0.0–0.5)
Eosinophils Relative: 2 %
HCT: 36.6 % (ref 36.0–46.0)
Hemoglobin: 11.7 g/dL — ABNORMAL LOW (ref 12.0–15.0)
Immature Granulocytes: 0 %
Lymphocytes Relative: 36 %
Lymphs Abs: 3.1 K/uL (ref 0.7–4.0)
MCH: 30.1 pg (ref 26.0–34.0)
MCHC: 32 g/dL (ref 30.0–36.0)
MCV: 94.1 fL (ref 80.0–100.0)
Monocytes Absolute: 0.8 K/uL (ref 0.1–1.0)
Monocytes Relative: 9 %
Neutro Abs: 4.6 K/uL (ref 1.7–7.7)
Neutrophils Relative %: 53 %
Platelets: 141 K/uL — ABNORMAL LOW (ref 150–400)
RBC: 3.89 MIL/uL (ref 3.87–5.11)
RDW: 12 % (ref 11.5–15.5)
Smear Review: NORMAL
WBC: 8.6 K/uL (ref 4.0–10.5)
nRBC: 0 % (ref 0.0–0.2)

## 2024-11-03 LAB — URINALYSIS, ROUTINE W REFLEX MICROSCOPIC
Bilirubin Urine: NEGATIVE
Glucose, UA: NEGATIVE mg/dL
Hgb urine dipstick: NEGATIVE
Ketones, ur: NEGATIVE mg/dL
Nitrite: NEGATIVE
Protein, ur: 30 mg/dL — AB
Specific Gravity, Urine: 1.032 — ABNORMAL HIGH (ref 1.005–1.030)
WBC, UA: >50 WBC/hpf (ref 0–5)
pH: 5 (ref 5.0–8.0)

## 2024-11-03 LAB — COMPREHENSIVE METABOLIC PANEL WITH GFR
ALT: 12 U/L (ref 0–44)
AST: 18 U/L (ref 15–41)
Albumin: 4.4 g/dL (ref 3.5–5.0)
Alkaline Phosphatase: 104 U/L (ref 38–126)
Anion gap: 12 (ref 5–15)
BUN: 18 mg/dL (ref 8–23)
CO2: 24 mmol/L (ref 22–32)
Calcium: 10.4 mg/dL — ABNORMAL HIGH (ref 8.9–10.3)
Chloride: 104 mmol/L (ref 98–111)
Creatinine, Ser: 0.81 mg/dL (ref 0.44–1.00)
GFR, Estimated: >60 mL/min
Glucose, Bld: 160 mg/dL — ABNORMAL HIGH (ref 70–99)
Potassium: 3.5 mmol/L (ref 3.5–5.1)
Sodium: 140 mmol/L (ref 135–145)
Total Bilirubin: 0.3 mg/dL (ref 0.0–1.2)
Total Protein: 6.7 g/dL (ref 6.5–8.1)

## 2024-11-03 NOTE — Discharge Instructions (Signed)
 Please keep your scheduled follow-up appointment with your primary care provider.  If your symptoms change or worsen over the weekend, please return to the emergency department.

## 2024-11-03 NOTE — ED Provider Notes (Signed)
 "  [facility] Provider Note    Event Date/Time   First MD Initiated Contact with Patient 11/03/24 2232     (approximate)   History   Fall   HPI  Bianca Fry is a 79 y.o. female  with history of hypertension, CAD osteopenia, anxiety, and as listed in EMR presents to the emergency department for treatment and evaluation after mechanical, nonsyncopal fall at home.  She was unloading groceries and tripped over the sidewalk.  She landed on the right side of her head and face.  She hit both knees.  She is having more pain to the right than the left.  She also having some lower back pain.SABRA     Physical Exam    Vitals:   11/03/24 1923 11/03/24 2258  BP: (!) 166/66 (!) 159/62  Pulse: 79 81  Resp: 18 19  Temp: 97.9 F (36.6 C) 98.3 F (36.8 C)  SpO2: 97% 100%    General: Awake, no distress.  CV:  Good peripheral perfusion.  Resp:  Normal effort.  Abd:  No distention.  Other:  Hematoma to the right forehead and abrasion to the right cheek   No focal midline tenderness over the length of the spine.  ED Results / Procedures / Treatments   Labs (all labs ordered are listed, but only abnormal results are displayed)  Labs Reviewed  CBC WITH DIFFERENTIAL/PLATELET - Abnormal; Notable for the following components:      Result Value   Hemoglobin 11.7 (*)    Platelets 141 (*)    All other components within normal limits  COMPREHENSIVE METABOLIC PANEL WITH GFR - Abnormal; Notable for the following components:   Glucose, Bld 160 (*)    Calcium  10.4 (*)    All other components within normal limits  URINALYSIS, ROUTINE W REFLEX MICROSCOPIC     EKG  Not indicated   RADIOLOGY  Image and radiology report reviewed and interpreted by me. Radiology report consistent with the same.  CT head and cervical spine negative for acute concerns.  Image of the right knee is negative for acute bony abnormality.  PROCEDURES:  Critical Care performed:  No  Procedures   MEDICATIONS ORDERED IN ED:  Medications - No data to display   IMPRESSION / MDM / ASSESSMENT AND PLAN / ED COURSE   I have reviewed the triage note and vital signs. Vital signs are stable   Differential diagnosis includes, but is not limited to, ICH, subdural hematoma, concussion, cervical strain, cervical vertebral injury, musculoskeletal pain, patella fracture, knee strain  Patient's presentation is most consistent with acute presentation with potential threat to life or bodily function.  79 year old female presenting to the emergency department after mechanical, nonsyncopal fall.  She is not currently on any anticoagulants.  See HPI for further details.  While awaiting ER room assignment, labs were obtained.  Random glucose is 160 and CMP is otherwise normal.  CBC is reassuring.  CT of the head and image of the right knee is without bony abnormality.  CT of the cervical spine was not ordered from triage.  She has no focal midline tenderness and is able to perform range of motion without complaint of pain.  Once in treatment area, urine was obtained however patient does not want to wait on the results.  She has an appointment with her primary care on Monday and will have results reviewed then.  She has a chronic UTI for which she is on empiric treatment daily.  If she feels that the UTI is getting worse, she has medication that she can take.   Patient discharged home with head injury instructions and ER return precautions.      FINAL CLINICAL IMPRESSION(S) / ED DIAGNOSES   Final diagnoses:  Minor head injury, initial encounter  Traumatic hematoma of forehead, initial encounter  Acute strain of neck muscle, initial encounter     Rx / DC Orders   ED Discharge Orders     None        Note:  This document was prepared using Dragon voice recognition software and may include unintentional dictation errors.   Herlinda Kirk NOVAK, FNP 11/03/24 2314     Clarine Ozell LABOR, MD 11/05/24 [phone]  "

## 2024-11-03 NOTE — ED Triage Notes (Signed)
 Pt presents for a mechanical fall. States she tripped while bringin in groceries. Did hit head. Is not on blood thinners. Endorsing head pain, right knee pain and lumbar back pain. Ambulating with a steady gait. Denies dizziness. Endorsing mild nausea but states I think I am just shook up

## 2024-11-03 NOTE — ED Notes (Signed)
 Pt's wounds cleaned to remove debris.

## 2024-11-06 ENCOUNTER — Encounter: Payer: Self-pay | Admitting: Family Medicine

## 2024-11-06 ENCOUNTER — Telehealth: Payer: Self-pay | Admitting: Cardiovascular Disease

## 2024-11-06 ENCOUNTER — Ambulatory Visit: Admitting: Family Medicine

## 2024-11-06 ENCOUNTER — Other Ambulatory Visit: Payer: Self-pay

## 2024-11-06 VITALS — BP 154/62 | HR 71 | Temp 97.8°F | Ht 64.0 in | Wt 120.6 lb

## 2024-11-06 DIAGNOSIS — L89152 Pressure ulcer of sacral region, stage 2: Secondary | ICD-10-CM | POA: Diagnosis not present

## 2024-11-06 DIAGNOSIS — I6529 Occlusion and stenosis of unspecified carotid artery: Secondary | ICD-10-CM

## 2024-11-06 DIAGNOSIS — M533 Sacrococcygeal disorders, not elsewhere classified: Secondary | ICD-10-CM | POA: Diagnosis not present

## 2024-11-06 DIAGNOSIS — G8929 Other chronic pain: Secondary | ICD-10-CM | POA: Diagnosis not present

## 2024-11-06 DIAGNOSIS — N390 Urinary tract infection, site not specified: Secondary | ICD-10-CM | POA: Diagnosis not present

## 2024-11-06 NOTE — Assessment & Plan Note (Signed)
 Sacral sore has closed up but with residual hard scabbed area - ok to stop triple abx ointment, start regular desitin or other barrier cream use BID. Reviewed importance of avoiding pressure to area, keeping protected.

## 2024-11-06 NOTE — Assessment & Plan Note (Signed)
 Continue ppx keflex  250mg  daily.  No new symptoms.

## 2024-11-06 NOTE — Progress Notes (Signed)
 " Ph: [phone] Fax: [phone]   Patient ID: Bianca Fry, female    DOB: 09/21/1946, 79 y.o.   MRN: [id number]  This visit was conducted in person.  BP (!) 154/62 (BP Location: Right Arm, Cuff Size: Normal)   Pulse 71   Temp 97.8 F (36.6 C) (Oral)   Ht 5' 4 (1.626 m)   Wt 120 lb 9.6 oz (54.7 kg)   LMP 10/20/1979   SpO2 96%   BMI 20.70 kg/m   BP Readings from Last 3 Encounters:  11/06/24 (!) 154/62  11/03/24 (!) 159/62  10/23/24 132/64    CC: wound f/u visit  Subjective:   HPI: Bianca Fry is a 79 y.o. female presenting on 11/06/2024 for Medical Management of Chronic Issues (Wound F/U/Pt states little to no change, very sore, not able to sit comfortably /States she changes wound dressing once day and put neosporin on area, no dressing on area at the moment)   See prior note for details.  Diagnosed with stage 2 sacral pressure sore 10/16/2024 - at that time recommended treatment with triple abx and cushioned dressing changes daily. Not fully better yet. One harder area is smaller.   Seen Friday at ER after fall with head injury - while unloading groceries at home, tripped over sidewalk, landed to R head/face and hit both knees. She wonders if poorly fitting bifocal glasses led to impaired depth perception and fall. Imaging including R knee xray and head CT overall reassuring, did find chronic R mastoid effusion. UA at that time abnormal - she notes unchanged chronic cystitis symptoms, managed with keflex  250mg  daily and vaginal estrogen.   Previous hospitalization 08/2024 for acute colitis treated with abx (IV zosyn  followed by PO cipro ).  H/o diverticulitis and ischemic colitis and IBS alternating D/C    Cardiology has ordered carotid US .      Relevant past medical, surgical, family and social history reviewed and updated as indicated. Interim medical history since our last visit reviewed. Allergies and medications reviewed and updated. Outpatient Medications  Prior to Visit  Medication Sig Dispense Refill   ALPRAZolam  (XANAX ) 0.5 MG tablet Take 1 tablet (0.5 mg total) by mouth 2 (two) times daily as needed. for anxiety 40 tablet 0   aspirin  EC 81 MG tablet Take 1 tablet (81 mg total) by mouth daily. Swallow whole.     atorvastatin  (LIPITOR) 10 MG tablet TAKE ONE TABLET BY MOUTH ONE TIME DAILY 90 tablet 3   Calcium  Carb-Cholecalciferol  (CALCIUM -VITAMIN D ) 600-400 MG-UNIT TABS Take 1 tablet by mouth daily.     carvedilol  (COREG ) 6.25 MG tablet TAKE ONE TABLET BY MOUTH TWICE A DAY WITH FOOD 180 tablet 3   cephALEXin  (KEFLEX ) 250 MG capsule Take 250 mg by mouth at bedtime.     cholecalciferol  (VITAMIN D ) 25 MCG (1000 UNIT) tablet Take 1,000 Units by mouth daily.     dicyclomine  (BENTYL ) 10 MG capsule Take 1 capsule (10 mg total) by mouth 2 (two) times daily as needed for spasms. 90 capsule 1   EPINEPHrine  0.3 mg/0.3 mL IJ SOAJ injection Inject 0.3 mLs into the muscle.     esomeprazole  (NEXIUM ) 40 MG capsule Take 1 capsule (40 mg total) by mouth daily. 90 capsule 3   estradiol  (ESTRACE ) 0.1 MG/GM vaginal cream Use 1/2 g vaginally two or three times per week as needed to maintain symptom relief. (Patient taking differently: as needed. Use 1/2 g vaginally two or three times per week as needed to  maintain symptom relief.) 42.5 g 2   fexofenadine (ALLEGRA) 180 MG tablet Take 180 mg by mouth daily as needed for allergies.     losartan  (COZAAR ) 50 MG tablet Take 1.5 tablets (75 mg total) by mouth daily. 135 tablet 3   ofloxacin (OCUFLOX) 0.3 % ophthalmic solution Place 1 drop into the right eye 4 (four) times daily.     ondansetron  (ZOFRAN ) 4 MG tablet TAKE 1 TABLET BY MOUTH EVERY 8 HOURS AS NEEDED FOR NAUSEA AND VOMITING 20 tablet 0   polyethylene glycol powder (GLYCOLAX /MIRALAX ) 17 GM/SCOOP powder Take 17 g by mouth 2 (two) times daily as needed for moderate constipation (hold for diarrhea).     valACYclovir  (VALTREX ) 500 MG tablet Take one tablet (500 mg) by  mouth twice a day for 3 days as needed for a genital outbreak.  Take 4 tablets (2000 mg) by mouth twice a day for 1 day as needed for an oral outbreak. 30 tablet 2   acidophilus (RISAQUAD) CAPS capsule Take 1 capsule by mouth daily. (Patient not taking: Reported on 11/06/2024) 90 capsule 1   No facility-administered medications prior to visit.     Per HPI unless specifically indicated in ROS section below Review of Systems  Objective:  BP (!) 154/62 (BP Location: Right Arm, Cuff Size: Normal)   Pulse 71   Temp 97.8 F (36.6 C) (Oral)   Ht 5' 4 (1.626 m)   Wt 120 lb 9.6 oz (54.7 kg)   LMP 10/20/1979   SpO2 96%   BMI 20.70 kg/m   Wt Readings from Last 3 Encounters:  11/06/24 120 lb 9.6 oz (54.7 kg)  11/03/24 140 lb (63.5 kg)  10/23/24 121 lb 6.4 oz (55.1 kg)      Physical Exam Vitals and nursing note reviewed.  Constitutional:      Appearance: Normal appearance. She is not ill-appearing.  Musculoskeletal:        General: Tenderness present.     Comments:  Tender to palpation along sacrum and coccyx  Skin:    General: Skin is warm and dry.     Findings: Lesion present. No rash.     Comments:  Healing scabbing to sacrum with previous open sore now closed  Neurological:     Mental Status: She is alert.          Results for orders placed or performed during the hospital encounter of 11/03/24  CBC with Differential   Collection Time: 11/03/24  7:27 PM  Result Value Ref Range   WBC 8.6 4.0 - 10.5 K/uL   RBC 3.89 3.87 - 5.11 MIL/uL   Hemoglobin 11.7 (L) 12.0 - 15.0 g/dL   HCT 63.3 63.9 - 53.9 %   MCV 94.1 80.0 - 100.0 fL   MCH 30.1 26.0 - 34.0 pg   MCHC 32.0 30.0 - 36.0 g/dL   RDW 87.9 88.4 - 84.4 %   Platelets 141 (L) 150 - 400 K/uL   nRBC 0.0 0.0 - 0.2 %   Neutrophils Relative % 53 %   Neutro Abs 4.6 1.7 - 7.7 K/uL   Lymphocytes Relative 36 %   Lymphs Abs 3.1 0.7 - 4.0 K/uL   Monocytes Relative 9 %   Monocytes Absolute 0.8 0.1 - 1.0 K/uL   Eosinophils  Relative 2 %   Eosinophils Absolute 0.2 0.0 - 0.5 K/uL   Basophils Relative 0 %   Basophils Absolute 0.0 0.0 - 0.1 K/uL   WBC Morphology MORPHOLOGY UNREMARKABLE  RBC Morphology MORPHOLOGY UNREMARKABLE    Smear Review Normal platelet morphology    Immature Granulocytes 0 %   Abs Immature Granulocytes 0.02 0.00 - 0.07 K/uL  Comprehensive metabolic panel   Collection Time: 11/03/24  7:27 PM  Result Value Ref Range   Sodium 140 135 - 145 mmol/L   Potassium 3.5 3.5 - 5.1 mmol/L   Chloride 104 98 - 111 mmol/L   CO2 24 22 - 32 mmol/L   Glucose, Bld 160 (H) 70 - 99 mg/dL   BUN 18 8 - 23 mg/dL   Creatinine, Ser 9.18 0.44 - 1.00 mg/dL   Calcium  10.4 (H) 8.9 - 10.3 mg/dL   Total Protein 6.7 6.5 - 8.1 g/dL   Albumin 4.4 3.5 - 5.0 g/dL   AST 18 15 - 41 U/L   ALT 12 0 - 44 U/L   Alkaline Phosphatase 104 38 - 126 U/L   Total Bilirubin 0.3 0.0 - 1.2 mg/dL   GFR, Estimated >39 >39 mL/min   Anion gap 12 5 - 15  Urinalysis, Routine w reflex microscopic -Urine, Clean Catch   Collection Time: 11/03/24 10:50 PM  Result Value Ref Range   Color, Urine YELLOW (A) YELLOW   APPearance CLOUDY (A) CLEAR   Specific Gravity, Urine 1.032 (H) 1.005 - 1.030   pH 5.0 5.0 - 8.0   Glucose, UA NEGATIVE NEGATIVE mg/dL   Hgb urine dipstick NEGATIVE NEGATIVE   Bilirubin Urine NEGATIVE NEGATIVE   Ketones, ur NEGATIVE NEGATIVE mg/dL   Protein, ur 30 (A) NEGATIVE mg/dL   Nitrite NEGATIVE NEGATIVE   Leukocytes,Ua LARGE (A) NEGATIVE   RBC / HPF 21-50 0 - 5 RBC/hpf   WBC, UA >50 0 - 5 WBC/hpf   Bacteria, UA RARE (A) NONE SEEN   Squamous Epithelial / HPF 11-20 0 - 5 /HPF   Mucus PRESENT    Non Squamous Epithelial PRESENT (A) NONE SEEN    Assessment & Plan:   Problem List Items Addressed This Visit     Recurrent UTI   Continue ppx keflex  250mg  daily.  No new symptoms.       Chronic coccygeal pain - Primary   Chronic coccydynia present since 2021, acutely exacerbated Reviewed supportive measures  including offloading sacral pressure, avoiding prolonged sitting, using cushioning regularly.  Has seen PM&R Refer to orthopedics for further eval.       Sacral pressure sore   Sacral sore has closed up but with residual hard scabbed area - ok to stop triple abx ointment, start regular desitin or other barrier cream use BID. Reviewed importance of avoiding pressure to area, keeping protected.         No orders of the defined types were placed in this encounter.   Orders Placed This Encounter  Procedures   Ambulatory referral to Orthopedic Surgery    Referral Priority:   Routine    Referral Type:   Surgical    Referral Reason:   Specialty Services Required    Requested Specialty:   Orthopedic Surgery    Number of Visits Requested:   1    Patient Instructions  Use desitin or other barrier cream twice daily to sore sacral area I will refer you to orthopedist for chronic tailbone pain.  Continue working on diet, protein intake, 3 meals a day for goal weight loss Continue using cushioning for all sitting, avoid prolonged sitting.   Follow up plan: Return if symptoms worsen or fail to improve.  Anton Blas, MD   "

## 2024-11-06 NOTE — Assessment & Plan Note (Addendum)
 Chronic coccydynia present since 2021, acutely exacerbated Reviewed supportive measures including offloading sacral pressure, avoiding prolonged sitting, using cushioning regularly.  Has seen PM&R Refer to orthopedics for further eval.

## 2024-11-06 NOTE — Telephone Encounter (Signed)
 I spoke with Ms. Bianca Fry to inform her that Barnie Press, NP has ordered a Carotid Duplex. Message sent to Scheduling Team to get this ordered.

## 2024-11-06 NOTE — Patient Instructions (Addendum)
 Use desitin or other barrier cream twice daily to sore sacral area I will refer you to orthopedist for chronic tailbone pain.  Continue working on diet, protein intake, 3 meals a day for goal weight loss Continue using cushioning for all sitting, avoid prolonged sitting.

## 2024-11-06 NOTE — Telephone Encounter (Signed)
 Patient is requesting an order for a carotid ultrasound.

## 2024-11-18 ENCOUNTER — Other Ambulatory Visit: Payer: Self-pay | Admitting: Family Medicine

## 2024-11-18 DIAGNOSIS — R112 Nausea with vomiting, unspecified: Secondary | ICD-10-CM

## 2024-11-27 ENCOUNTER — Ambulatory Visit: Admitting: Cardiovascular Disease

## 2024-11-30 ENCOUNTER — Ambulatory Visit

## 2024-12-13 ENCOUNTER — Ambulatory Visit

## 2024-12-20 ENCOUNTER — Ambulatory Visit: Admitting: Family Medicine

## 2025-06-04 ENCOUNTER — Ambulatory Visit

## 2025-07-16 ENCOUNTER — Encounter: Admitting: Obstetrics and Gynecology

## 2025-08-17 ENCOUNTER — Other Ambulatory Visit

## 2025-08-24 ENCOUNTER — Encounter: Admitting: Family Medicine
